# Patient Record
Sex: Female | Born: 1945
Health system: Southern US, Community
[De-identification: ages and names within clinical notes are randomized; demographics above are authoritative.]

## PROBLEM LIST (undated history)

## (undated) DIAGNOSIS — I10 Essential (primary) hypertension: Secondary | ICD-10-CM

## (undated) DIAGNOSIS — T4145XA Adverse effect of unspecified anesthetic, initial encounter: Secondary | ICD-10-CM

## (undated) DIAGNOSIS — T148XXA Other injury of unspecified body region, initial encounter: Secondary | ICD-10-CM

## (undated) DIAGNOSIS — G4733 Obstructive sleep apnea (adult) (pediatric): Secondary | ICD-10-CM

## (undated) DIAGNOSIS — L03119 Cellulitis of unspecified part of limb: Secondary | ICD-10-CM

## (undated) DIAGNOSIS — S86019A Strain of unspecified Achilles tendon, initial encounter: Secondary | ICD-10-CM

## (undated) DIAGNOSIS — J189 Pneumonia, unspecified organism: Secondary | ICD-10-CM

## (undated) DIAGNOSIS — G2581 Restless legs syndrome: Secondary | ICD-10-CM

## (undated) DIAGNOSIS — M869 Osteomyelitis, unspecified: Secondary | ICD-10-CM

## (undated) DIAGNOSIS — T8859XA Other complications of anesthesia, initial encounter: Secondary | ICD-10-CM

## (undated) DIAGNOSIS — F419 Anxiety disorder, unspecified: Secondary | ICD-10-CM

## (undated) DIAGNOSIS — M1990 Unspecified osteoarthritis, unspecified site: Secondary | ICD-10-CM

## (undated) DIAGNOSIS — L02619 Cutaneous abscess of unspecified foot: Secondary | ICD-10-CM

## (undated) DIAGNOSIS — I509 Heart failure, unspecified: Secondary | ICD-10-CM

## (undated) DIAGNOSIS — D649 Anemia, unspecified: Secondary | ICD-10-CM

## (undated) DIAGNOSIS — I499 Cardiac arrhythmia, unspecified: Secondary | ICD-10-CM

## (undated) DIAGNOSIS — E119 Type 2 diabetes mellitus without complications: Secondary | ICD-10-CM

## (undated) HISTORY — PX: ACHILLES TENDON REPAIR: SUR1153

## (undated) HISTORY — PX: ABDOMINAL HYSTERECTOMY: SHX81

## (undated) HISTORY — PX: TOE AMPUTATION: SHX809

## (undated) HISTORY — PX: BREAST SURGERY: SHX581

---

## 2011-04-25 DIAGNOSIS — R0602 Shortness of breath: Secondary | ICD-10-CM

## 2011-04-25 DIAGNOSIS — I509 Heart failure, unspecified: Secondary | ICD-10-CM

## 2011-04-26 DIAGNOSIS — I5031 Acute diastolic (congestive) heart failure: Secondary | ICD-10-CM

## 2011-04-27 ENCOUNTER — Telehealth: Payer: Self-pay | Admitting: *Deleted

## 2011-04-27 NOTE — Telephone Encounter (Addendum)
Left message for Mary [name] at [facility] hospitalist services to call our office r/e patient needing note for work. Patient informed that her note was supposed to be given by Dr. Rowe Pavy to return on 05/03/11.

## 2011-04-27 NOTE — Telephone Encounter (Signed)
Return to work on Monday, 05/03/11 . Dr. Rowe Pavy was supposed to give her a note.

## 2011-04-27 NOTE — Telephone Encounter (Signed)
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 Patient stated she was diagnosed with CHF and d/c from [facility] on yesterday.

## 2011-04-28 NOTE — Telephone Encounter (Signed)
Mrs. Mary [name] called back and said Dr. Rowe Pavy was out of town and she would see if Dr. Alean Rinne would write note for patient and she would give [place] a call back. Patient informed of the above information.

## 2011-04-28 NOTE — Telephone Encounter (Signed)
Husband of patient called to office to inquire what work note stated. Nurse informed him that the hospitalist services at [facility] provided this for patient and I was unable to read it. Husband was concerned and fustrated about MD at hospital telling her to go back to work the very next day. Nurse reassured him that Dr. Fletcher Anon stated that the plan was for patient to return to work on 05/03/11. Husband informed that d/c states that she is to f/u with [facility] in 2 weeks and appointment was confirmed with husband. Husband informed again that work note was available at [facility] main entrance with Engineer, manufacturing.

## 2011-04-28 NOTE — Telephone Encounter (Signed)
Work note available and left with volunteers at [facility] and she can pick up at main entrance of hospital per [facility].

## 2011-04-28 NOTE — Telephone Encounter (Signed)
Patient informed and said husband was on the way to pick up note now.

## 2011-04-28 NOTE — Telephone Encounter (Signed)
Left message again on Mary [name] [facility]'s voicemail again requesting a return call.

## 2011-05-20 ENCOUNTER — Encounter: Payer: Medicare Other | Admitting: Cardiovascular Disease

## 2011-07-25 ENCOUNTER — Encounter (HOSPITAL_COMMUNITY): Payer: Self-pay | Admitting: *Deleted

## 2011-07-25 ENCOUNTER — Inpatient Hospital Stay (HOSPITAL_COMMUNITY)
Admission: EM | Admit: 2011-07-25 | Discharge: 2011-07-27 | DRG: 291 | Disposition: A | Discharge status: Home / Self Care | Payer: PRIVATE HEALTH INSURANCE | Attending: Internal Medicine | Admitting: Internal Medicine

## 2011-07-25 ENCOUNTER — Emergency Department (HOSPITAL_COMMUNITY): Payer: PRIVATE HEALTH INSURANCE

## 2011-07-25 ENCOUNTER — Other Ambulatory Visit: Payer: Self-pay

## 2011-07-25 DIAGNOSIS — Z7982 Long term (current) use of aspirin: Secondary | ICD-10-CM

## 2011-07-25 DIAGNOSIS — D649 Anemia, unspecified: Secondary | ICD-10-CM | POA: Diagnosis present

## 2011-07-25 DIAGNOSIS — Z794 Long term (current) use of insulin: Secondary | ICD-10-CM

## 2011-07-25 DIAGNOSIS — E119 Type 2 diabetes mellitus without complications: Secondary | ICD-10-CM

## 2011-07-25 DIAGNOSIS — I509 Heart failure, unspecified: Secondary | ICD-10-CM | POA: Diagnosis present

## 2011-07-25 DIAGNOSIS — G2581 Restless legs syndrome: Secondary | ICD-10-CM | POA: Diagnosis present

## 2011-07-25 DIAGNOSIS — G4733 Obstructive sleep apnea (adult) (pediatric): Secondary | ICD-10-CM | POA: Diagnosis present

## 2011-07-25 DIAGNOSIS — Z79899 Other long term (current) drug therapy: Secondary | ICD-10-CM

## 2011-07-25 DIAGNOSIS — R0902 Hypoxemia: Secondary | ICD-10-CM

## 2011-07-25 DIAGNOSIS — R0600 Dyspnea, unspecified: Secondary | ICD-10-CM

## 2011-07-25 DIAGNOSIS — J9600 Acute respiratory failure, unspecified whether with hypoxia or hypercapnia: Secondary | ICD-10-CM | POA: Diagnosis present

## 2011-07-25 DIAGNOSIS — I5033 Acute on chronic diastolic (congestive) heart failure: Principal | ICD-10-CM | POA: Diagnosis present

## 2011-07-25 DIAGNOSIS — J209 Acute bronchitis, unspecified: Secondary | ICD-10-CM | POA: Diagnosis present

## 2011-07-25 DIAGNOSIS — R5381 Other malaise: Secondary | ICD-10-CM | POA: Diagnosis present

## 2011-07-25 HISTORY — DX: Restless legs syndrome: G25.81

## 2011-07-25 HISTORY — DX: Obstructive sleep apnea (adult) (pediatric): G47.33

## 2011-07-25 HISTORY — DX: Type 2 diabetes mellitus without complications: E11.9

## 2011-07-25 HISTORY — DX: Heart failure, unspecified: I50.9

## 2011-07-25 LAB — CARDIAC PANEL(CRET KIN+CKTOT+MB+TROPI)
CK, MB: 3.5 ng/mL (ref 0.3–4.0)
Relative Index: 2.2 (ref 0.0–2.5)
Relative Index: 2.3 (ref 0.0–2.5)
Troponin I: <0.3 ng/mL (ref <0.30)
Troponin I: <0.3 ng/mL (ref <0.30)

## 2011-07-25 LAB — POCT I-STAT, CHEM 8
Chloride: 105 mEq/L (ref 96–112)
Creatinine, Ser: 0.9 mg/dL (ref 0.50–1.10)
Glucose, Bld: 278 mg/dL — ABNORMAL HIGH (ref 70–99)
Potassium: 4.7 mEq/L (ref 3.5–5.1)

## 2011-07-25 LAB — CBC
HCT: 30.1 % — ABNORMAL LOW (ref 36.0–46.0)
Hemoglobin: 10.1 g/dL — ABNORMAL LOW (ref 12.0–15.0)
MCH: 29.3 pg (ref 26.0–34.0)
MCV: 87.2 fL (ref 78.0–100.0)
RBC: 3.45 MIL/uL — ABNORMAL LOW (ref 3.87–5.11)

## 2011-07-25 LAB — GLUCOSE, CAPILLARY
Glucose-Capillary: 208 mg/dL — ABNORMAL HIGH (ref 70–99)
Glucose-Capillary: 117 mg/dL — ABNORMAL HIGH (ref 70–99)

## 2011-07-25 LAB — HEMOGLOBIN A1C: Hgb A1c MFr Bld: 9.9 % — ABNORMAL HIGH (ref <5.7)

## 2011-07-25 MED ORDER — SODIUM CHLORIDE 0.9 % IV SOLN: 250.0000 mL | INTRAVENOUS | Status: DC | PRN

## 2011-07-25 MED ORDER — PREGABALIN 75 MG PO CAPS
ORAL_CAPSULE | ORAL | Status: AC
  Administered 2011-07-25: 150 mg via ORAL
  Filled 2011-07-25: qty 2

## 2011-07-25 MED ORDER — ACETAMINOPHEN 325 MG PO TABS: 650.0000 mg | ORAL_TABLET | ORAL | Status: DC | PRN

## 2011-07-25 MED ORDER — METOPROLOL SUCCINATE ER 50 MG PO TB24
50.0000 mg | ORAL_TABLET | Freq: Every day | ORAL | Status: DC
  Administered 2011-07-25 – 2011-07-27 (×3): 50 mg via ORAL
  Filled 2011-07-25 (×3): qty 1

## 2011-07-25 MED ORDER — SODIUM CHLORIDE 0.9 % IJ SOLN
3.0000 mL | INTRAMUSCULAR | Status: DC | PRN
  Administered 2011-07-25: 3 mL via INTRAVENOUS
  Filled 2011-07-25 (×2): qty 3

## 2011-07-25 MED ORDER — FUROSEMIDE 10 MG/ML IJ SOLN
40.0000 mg | Freq: Once | INTRAMUSCULAR | Status: AC
  Administered 2011-07-25: 40 mg via INTRAVENOUS
  Filled 2011-07-25: qty 4

## 2011-07-25 MED ORDER — PREGABALIN 75 MG PO CAPS
150.0000 mg | ORAL_CAPSULE | Freq: Once | ORAL | Status: AC
  Administered 2011-07-25: 150 mg via ORAL

## 2011-07-25 MED ORDER — ROPINIROLE HCL 0.25 MG PO TABS
0.5000 mg | ORAL_TABLET | Freq: Every evening | ORAL | Status: DC
  Administered 2011-07-25 – 2011-07-26 (×2): 0.5 mg via ORAL

## 2011-07-25 MED ORDER — PANTOPRAZOLE SODIUM 40 MG PO TBEC
40.0000 mg | DELAYED_RELEASE_TABLET | Freq: Every day | ORAL | Status: DC
  Administered 2011-07-25 – 2011-07-27 (×3): 40 mg via ORAL
  Filled 2011-07-25 (×4): qty 1

## 2011-07-25 MED ORDER — SODIUM CHLORIDE 0.9 % IV SOLN
Freq: Once | INTRAVENOUS | Status: AC
  Administered 2011-07-25: 1000 mL via INTRAVENOUS

## 2011-07-25 MED ORDER — INSULIN ASPART 100 UNIT/ML ~~LOC~~ SOLN
20.0000 [IU] | Freq: Three times a day (TID) | SUBCUTANEOUS | Status: DC
  Administered 2011-07-25 – 2011-07-27 (×7): 20 [IU] via SUBCUTANEOUS
  Filled 2011-07-25: qty 3

## 2011-07-25 MED ORDER — GUAIFENESIN ER 600 MG PO TB12
1200.0000 mg | ORAL_TABLET | Freq: Two times a day (BID) | ORAL | Status: DC
  Administered 2011-07-25 – 2011-07-27 (×5): 1200 mg via ORAL
  Filled 2011-07-25 (×5): qty 2

## 2011-07-25 MED ORDER — LISINOPRIL 10 MG PO TABS
20.0000 mg | ORAL_TABLET | Freq: Every day | ORAL | Status: DC
  Administered 2011-07-25 – 2011-07-27 (×3): 20 mg via ORAL
  Filled 2011-07-25 (×3): qty 2

## 2011-07-25 MED ORDER — INSULIN DETEMIR 100 UNIT/ML ~~LOC~~ SOLN
50.0000 [IU] | Freq: Two times a day (BID) | SUBCUTANEOUS | Status: DC
  Administered 2011-07-25 – 2011-07-27 (×5): 50 [IU] via SUBCUTANEOUS
  Filled 2011-07-25: qty 3

## 2011-07-25 MED ORDER — ENOXAPARIN SODIUM 40 MG/0.4ML ~~LOC~~ SOLN
40.0000 mg | SUBCUTANEOUS | Status: DC
  Administered 2011-07-25 – 2011-07-27 (×3): 40 mg via SUBCUTANEOUS
  Filled 2011-07-25 (×3): qty 0.4

## 2011-07-25 MED ORDER — INSULIN ASPART 100 UNIT/ML ~~LOC~~ SOLN
0.0000 [IU] | Freq: Every day | SUBCUTANEOUS | Status: DC
  Administered 2011-07-26: 3 [IU] via SUBCUTANEOUS

## 2011-07-25 MED ORDER — TRAMADOL HCL 50 MG PO TABS
50.0000 mg | ORAL_TABLET | Freq: Four times a day (QID) | ORAL | Status: DC | PRN
  Administered 2011-07-25 – 2011-07-27 (×2): 50 mg via ORAL
  Filled 2011-07-25 (×2): qty 1

## 2011-07-25 MED ORDER — INSULIN ASPART 100 UNIT/ML ~~LOC~~ SOLN
0.0000 [IU] | Freq: Three times a day (TID) | SUBCUTANEOUS | Status: DC
  Administered 2011-07-25: 8 [IU] via SUBCUTANEOUS
  Administered 2011-07-25: 5 [IU] via SUBCUTANEOUS
  Administered 2011-07-26: 2 [IU] via SUBCUTANEOUS
  Administered 2011-07-26: 3 [IU] via SUBCUTANEOUS
  Administered 2011-07-27 (×2): 2 [IU] via SUBCUTANEOUS

## 2011-07-25 MED ORDER — CYCLOBENZAPRINE HCL 10 MG PO TABS
5.0000 mg | ORAL_TABLET | Freq: Three times a day (TID) | ORAL | Status: DC
  Administered 2011-07-25 – 2011-07-27 (×7): 5 mg via ORAL
  Filled 2011-07-25 (×7): qty 1

## 2011-07-25 MED ORDER — FUROSEMIDE 10 MG/ML IJ SOLN
INTRAMUSCULAR | Status: AC
  Filled 2011-07-25: qty 4

## 2011-07-25 MED ORDER — ROPINIROLE HCL 1 MG PO TABS
ORAL_TABLET | ORAL | Status: AC
  Filled 2011-07-25: qty 1

## 2011-07-25 MED ORDER — ASPIRIN EC 81 MG PO TBEC
81.0000 mg | DELAYED_RELEASE_TABLET | Freq: Every day | ORAL | Status: DC
  Administered 2011-07-25 – 2011-07-27 (×3): 81 mg via ORAL
  Filled 2011-07-25 (×3): qty 1

## 2011-07-25 MED ORDER — IOHEXOL 350 MG/ML SOLN
100.0000 mL | Freq: Once | INTRAVENOUS | Status: AC | PRN
  Administered 2011-07-25: 100 mL via INTRAVENOUS

## 2011-07-25 MED ORDER — SODIUM CHLORIDE 0.9 % IJ SOLN
3.0000 mL | Freq: Two times a day (BID) | INTRAMUSCULAR | Status: DC
  Administered 2011-07-25 – 2011-07-27 (×5): 3 mL via INTRAVENOUS
  Filled 2011-07-25 (×4): qty 3

## 2011-07-25 MED ORDER — FUROSEMIDE 10 MG/ML IJ SOLN: 40.0000 mg | Freq: Once | INTRAMUSCULAR | Status: DC

## 2011-07-25 MED ORDER — ALBUTEROL SULFATE HFA 108 (90 BASE) MCG/ACT IN AERS
2.0000 | INHALATION_SPRAY | Freq: Four times a day (QID) | RESPIRATORY_TRACT | Status: DC
  Administered 2011-07-25 – 2011-07-27 (×6): 2 via RESPIRATORY_TRACT
  Filled 2011-07-25: qty 6.7

## 2011-07-25 MED ORDER — FUROSEMIDE 10 MG/ML IJ SOLN
40.0000 mg | Freq: Two times a day (BID) | INTRAMUSCULAR | Status: DC
  Administered 2011-07-25 – 2011-07-26 (×3): 40 mg via INTRAVENOUS
  Filled 2011-07-25 (×3): qty 4

## 2011-07-25 MED ORDER — ONDANSETRON HCL 4 MG/2ML IJ SOLN: 4.0000 mg | Freq: Four times a day (QID) | INTRAMUSCULAR | Status: DC | PRN

## 2011-07-25 NOTE — ED Notes (Signed)
Pt report chf. Started having trouble breathing 2 days ago. Became worse tonight. Pt states has o2 at home & cpap at night. Not using cpap all night.

## 2011-07-25 NOTE — ED Notes (Signed)
Attempted to call report, nurse will return call.

## 2011-07-25 NOTE — ED Provider Notes (Signed)
History     CSN: [id number]  Arrival date & time 07/25/11  [id number]   First MD Initiated Contact with Patient 07/25/11 [id number]      Chief Complaint  Patient presents with  . Shortness of Breath    (Consider location/radiation/quality/duration/timing/severity/associated sxs/prior treatment) Patient is a 66 y.o. female presenting with shortness of breath. The history is provided by the patient.  Shortness of Breath  The current episode started today. The problem has been gradually worsening. The problem is moderate. The symptoms are relieved by nothing. The symptoms are aggravated by a supine position. Associated symptoms include shortness of breath. Pertinent negatives include no chest pain, no chest pressure, no fever, no cough and no wheezing. There were no sick contacts. Recently, medical care has been given by the PCP. Services Performed: social she has congestive heart failure, recently taken off of Lasix. Patient unable to tell why.  symptoms more severe tonight, unable to lay down flat and worse with any exertion. No cough or recent illness. She has some right lower extremity swelling with recent callus" infection of right foot". She denies any calf pain or streaking erythema. No trauma.  Past Medical History  Diagnosis Date  . CHF (congestive heart failure)   . Diabetes mellitus     Past Surgical History  Procedure Date  . Abdominal hysterectomy   . Achilles tendon repair     No family history on file.  History  Substance Use Topics  . Smoking status: Never Smoker   . Smokeless tobacco: Not on file  . Alcohol Use: No    OB History    Grav Para Term Preterm Abortions TAB SAB Ect Mult Living                  Review of Systems  Constitutional: Negative for fever and chills.  HENT: Negative for neck pain and neck stiffness.   Eyes: Negative for pain.  Respiratory: Positive for shortness of breath. Negative for cough and wheezing.   Cardiovascular: Negative for chest  pain and palpitations.  Gastrointestinal: Negative for abdominal pain.  Genitourinary: Negative for dysuria.  Musculoskeletal: Negative for back pain.  Skin: Negative for rash.  Neurological: Negative for headaches.  All other systems reviewed and are negative.    Allergies  Review of patient's allergies indicates no known allergies.  Home Medications   Current Outpatient Rx  Name Route Sig Dispense Refill  . CYCLOBENZAPRINE HCL 5 MG PO TABS Oral Take 5 mg by mouth 3 (three) times daily.    Marland Kitchen LEVEMIR FLEXPEN [place] Subcutaneous Inject 100 Units into the skin. 50units in morning & 50uints at night.    . INSULIN LISPRO (HUMAN) 100 UNIT/ML [place] SOLN Subcutaneous Inject 100 Units into the skin 3 (three) times daily before meals.    Marland Kitchen LISINOPRIL 20 MG PO TABS Oral Take 20 mg by mouth daily.    Marland Kitchen METFORMIN HCL 500 MG PO TABS Oral Take 500 mg by mouth 2 (two) times daily with a meal.    . METOPROLOL SUCCINATE ER 50 MG PO TB24 Oral Take 50 mg by mouth daily. Take with or immediately following a meal.    . OMEPRAZOLE 20 MG PO CPDR Oral Take 20 mg by mouth daily.    Marland Kitchen ROPINIROLE HCL 0.5 MG PO TABS Oral Take 0.5 mg by mouth Nightly.    . SULFAMETHOXAZOLE-TMP DS 800-160 MG PO TABS Oral Take 1 tablet by mouth 2 (two) times daily.    . TRAMADOL HCL  50 MG PO TABS Oral Take 50 mg by mouth 4 (four) times daily as needed.      BP 102/84  Pulse 80  Temp(Src) 98.2 F (36.8 C) (Oral)  Resp 21  Ht 5\' 10"  (1.778 m)  Wt 298 lb (135.172 kg)  BMI 42.76 kg/m2  SpO2 96%  LMP 07/25/2011  Physical Exam  Constitutional: She is oriented to person, place, and time. She appears well-developed and well-nourished.  HENT:  Head: Normocephalic and atraumatic.  Eyes: Conjunctivae and EOM are normal. Pupils are equal, round, and reactive to light.  Neck: Trachea normal. Neck supple. No thyromegaly present.  Cardiovascular: Normal rate, regular rhythm, S1 normal, S2 normal and normal pulses.     No systolic murmur  is present   No diastolic murmur is present  Pulses:      Radial pulses are 2+ on the right side, and 2+ on the left side.  Pulmonary/Chest: She has no wheezes. She has no rhonchi. She exhibits no tenderness.       Proxy a. Tachypnea. Mild respiratory distress. Basilar posterior rales  Abdominal: Soft. Normal appearance and bowel sounds are normal. There is no tenderness. There is no CVA tenderness and negative Murphy's sign.  Musculoskeletal:       Right foot with callus over the medial aspect of the heel and mild surrounding erythema and edema. No calf tenderness or cords. Negative Homans sign  Neurological: She is alert and oriented to person, place, and time. She has normal strength. No cranial nerve deficit or sensory deficit. GCS eye subscore is 4. GCS verbal subscore is 5. GCS motor subscore is 6.  Skin: Skin is warm and dry. No rash noted. She is not diaphoretic.  Psychiatric: Her speech is normal.       Cooperative and appropriate    ED Course  Procedures (including critical care time)  Results for orders placed during the hospital encounter of 07/25/11  CBC      Component Value Range   WBC 8.9  4.0 - 10.5 (K/uL)   RBC 3.45 (*) 3.87 - 5.11 (MIL/uL)   Hemoglobin 10.1 (*) 12.0 - 15.0 (g/dL)   HCT 30.1 (*) 36.0 - 46.0 (%)   MCV 87.2  78.0 - 100.0 (fL)   MCH 29.3  26.0 - 34.0 (pg)   MCHC 33.6  30.0 - 36.0 (g/dL)   RDW 15.3  11.5 - 15.5 (%)   Platelets 271  150 - 400 (K/uL)  PRO B NATRIURETIC PEPTIDE      Component Value Range   Pro B Natriuretic peptide (BNP) 586.6 (*) 0 - 125 (pg/mL)  POCT I-STAT, CHEM 8      Component Value Range   Sodium 137  135 - 145 (mEq/L)   Potassium 4.7  3.5 - 5.1 (mEq/L)   Chloride 105  96 - 112 (mEq/L)   BUN 20  6 - 23 (mg/dL)   Creatinine, Ser 0.90  0.50 - 1.10 (mg/dL)   Glucose, Bld 278 (*) 70 - 99 (mg/dL)   Calcium, Ion 1.16  1.12 - 1.32 (mmol/L)   TCO2 24  0 - 100 (mmol/L)   Hemoglobin 10.2 (*) 12.0 - 15.0 (g/dL)   HCT 30.0 (*) 36.0 -  46.0 (%)  POCT I-STAT TROPONIN I      Component Value Range   Troponin i, poc 0.00  0.00 - 0.08 (ng/mL)   Comment 3            Dg Chest Portable 1 View  07/25/2011  *RADIOLOGY REPORT*  Clinical Data: Shortness of breath.  PORTABLE CHEST - 1 VIEW  Comparison: None.  Findings: Heart is mildly enlarged.  Vascular congestion.  Diffuse peribronchial thickening.  No confluent opacities or effusions.  IMPRESSION: Cardiomegaly with vascular congestion.  Diffuse peribronchial thickening, likely bronchitis.  Original Report Authenticated By: Raelyn Number, M.D.     Dg Chest Portable 1 View  07/25/2011  *RADIOLOGY REPORT*  Clinical Data: Shortness of breath.  PORTABLE CHEST - 1 VIEW  Comparison: None.  Findings: Heart is mildly enlarged.  Vascular congestion.  Diffuse peribronchial thickening.  No confluent opacities or effusions.  IMPRESSION: Cardiomegaly with vascular congestion.  Diffuse peribronchial thickening, likely bronchitis.  Original Report Authenticated By: Raelyn Number, M.D.     Date: 07/25/2011  Rate: 78  Rhythm: normal sinus rhythm  QRS Axis: normal  Intervals: normal  ST/T Wave abnormalities: nonspecific ST changes  Conduction Disutrbances:none  Narrative Interpretation:   Old EKG Reviewed: none available  Hypoxia improved with oxygen. Lasix IV 40 mg provided. Labs and x-ray reviewed as above.   MDM  shortness of breath and hypoxia with recent diagnosis of congestive heart failure. Presentation concerning for the same.Followed by primary care and has not seen a cardiologist.she does have elevated d-dimer sent for CT scan. AT 710am, Case discussed with triad hospitalist who agrees to admission        Teressa Lower, MD 07/25/11 [phone]

## 2011-07-25 NOTE — H&P (Signed)
PCP:   No primary provider on file.   Chief Complaint:  Shortness of breath  HPI: This is a 66 year old female who presents to the emergency room with complaints of shortness of breath. She reports being recently diagnosed with congestive heart failure and was placed on Lasix. Apparently approximately 3 months ago she was taken off her Lasix due to dehydration. Since being off the Lasix she has a gradual decline in her respiratory status. He was initially noted when she was having difficulty with dyspnea on exertion. This has progressively gotten worse. Last night she reports that she was unable to sleep due to shortness of breath. She noted to be wheezing. She's had a mild nonproductive cough. She denies any fever. No chest pain. She denies any sick contacts. She has not noted any significant edema in her lower extremities. She sleeps with 2 pillows. She was recently started on CPAP for obstructive sleep apnea. She reports increasingly fatigued, tired. She is frustrated from being sick for the past 2 months. She reports seeing multiple doctors and has been told different things such as she has congestive heart failure and that she has pneumonia.  Patient was evaluated in the emergency room where she was noted to be hypoxic with oxygen saturations in the high 80s on room air, chest x-ray showed no signs of fluid overload as well as possible bronchitis. She did receive some Lasix in the emergency room and has had urine output. She reports an improvement in her symptoms.  Allergies:  No Known Allergies    Past Medical History  Diagnosis Date  . CHF (congestive heart failure)   . Diabetes mellitus   . Restless leg syndrome 07/25/2011  . OSA (obstructive sleep apnea) 07/25/2011    Past Surgical History  Procedure Date  . Abdominal hysterectomy   . Achilles tendon repair     Prior to Admission medications   Medication Sig Start Date End Date Taking? Authorizing Provider  cyclobenzaprine  (FLEXERIL) 5 MG tablet Take 5 mg by mouth 3 (three) times daily.   Yes Historical Provider, MD  Insulin Detemir (LEVEMIR FLEXPEN [place]) Inject 100 Units into the skin. 50units in morning & 50uints at night.   Yes Historical Provider, MD  insulin lispro (HUMALOG) 100 UNIT/ML injection Inject 100 Units into the skin 3 (three) times daily before meals.   Yes Historical Provider, MD  lisinopril (PRINIVIL,ZESTRIL) 20 MG tablet Take 20 mg by mouth daily.   Yes Historical Provider, MD  metFORMIN (GLUCOPHAGE) 500 MG tablet Take 500 mg by mouth 2 (two) times daily with a meal.   Yes Historical Provider, MD  metoprolol succinate (TOPROL-XL) 50 MG 24 hr tablet Take 50 mg by mouth daily. Take with or immediately following a meal.   Yes Historical Provider, MD  omeprazole (PRILOSEC) 20 MG capsule Take 20 mg by mouth daily.   Yes Historical Provider, MD  rOPINIRole (REQUIP) 0.5 MG tablet Take 0.5 mg by mouth Nightly.   Yes Historical Provider, MD  sulfamethoxazole-trimethoprim (BACTRIM DS) 800-160 MG per tablet Take 1 tablet by mouth 2 (two) times daily.   Yes Historical Provider, MD  traMADol (ULTRAM) 50 MG tablet Take 50 mg by mouth 4 (four) times daily as needed.   Yes Historical Provider, MD    Social History:  reports that she has never smoked. She does not have any smokeless tobacco history on file. She reports that she does not drink alcohol or use illicit drugs.  No family history on file.  Review of Systems: Positives in bold Constitutional: Denies fever, chills, diaphoresis, appetite change and fatigue.  HEENT: Denies photophobia, eye pain, redness, hearing loss, ear pain, congestion, sore throat, rhinorrhea, sneezing, mouth sores, trouble swallowing, neck pain, neck stiffness and tinnitus.   Respiratory: Denies SOB, DOE, cough, chest tightness,  and wheezing.   Cardiovascular: Denies chest pain, palpitations and leg swelling.  Gastrointestinal: Denies nausea, vomiting, abdominal pain, diarrhea,  constipation, blood in stool and abdominal distention.  Genitourinary: Denies dysuria, urgency, frequency, hematuria, flank pain and difficulty urinating.  Musculoskeletal: Denies myalgias, back pain, joint swelling, arthralgias and gait problem.  Skin: Denies pallor, rash and wound.  Neurological: Denies dizziness, seizures, syncope, weakness, light-headedness, numbness and headaches.  Hematological: Denies adenopathy. Easy bruising, personal or family bleeding history  Psychiatric/Behavioral: Denies suicidal ideation, mood changes, confusion, nervousness, sleep disturbance and agitation   Physical Exam: Blood pressure 102/84, pulse 80, temperature 98.2 F (36.8 C), temperature source Oral, resp. rate 21, height 5\' 10"  (1.778 m), weight 135.172 kg (298 lb), last menstrual period 07/25/2011, SpO2 96.00%. General: Patient is lying in bed, in no acute distress, alert and 90x3 HEENT: Normocephalic, H., pupils are equal round reactive to light Chest: Crackles at the bases bilaterally Abdomen: Soft, nontender, and nondistended, bowel sounds are active Cardiac: S1, S2, regular rate and rhythm Extremities: Trace edema bilaterally, no cyanosis, no clubbing Neurologic: Grossly intact, nonfocal Skin: Intact, warm, no visible rashes  Labs on Admission:  Results for orders placed during the hospital encounter of 07/25/11 (from the past 48 hour(s))  CBC     Status: Abnormal   Collection Time   07/25/11  5:21 AM      Component Value Range Comment   WBC 8.9  4.0 - 10.5 (K/uL)    RBC 3.45 (*) 3.87 - 5.11 (MIL/uL)    Hemoglobin 10.1 (*) 12.0 - 15.0 (g/dL)    HCT 30.1 (*) 36.0 - 46.0 (%)    MCV 87.2  78.0 - 100.0 (fL)    MCH 29.3  26.0 - 34.0 (pg)    MCHC 33.6  30.0 - 36.0 (g/dL)    RDW 15.3  11.5 - 15.5 (%)    Platelets 271  150 - 400 (K/uL)   PRO B NATRIURETIC PEPTIDE     Status: Abnormal   Collection Time   07/25/11  5:21 AM      Component Value Range Comment   Pro B Natriuretic peptide (BNP)  586.6 (*) 0 - 125 (pg/mL)   D-DIMER, QUANTITATIVE     Status: Abnormal   Collection Time   07/25/11  5:42 AM      Component Value Range Comment   D-Dimer, Quant 1.43 (*) 0.00 - 0.48 (ug/mL-FEU)   POCT I-STAT TROPONIN I     Status: Normal   Collection Time   07/25/11  5:47 AM      Component Value Range Comment   Troponin i, poc 0.00  0.00 - 0.08 (ng/mL)    Comment 3            POCT I-STAT, CHEM 8     Status: Abnormal   Collection Time   07/25/11  5:49 AM      Component Value Range Comment   Sodium 137  135 - 145 (mEq/L)    Potassium 4.7  3.5 - 5.1 (mEq/L)    Chloride 105  96 - 112 (mEq/L)    BUN 20  6 - 23 (mg/dL)    Creatinine, Ser 0.90  0.50 - 1.10 (  mg/dL)    Glucose, Bld 278 (*) 70 - 99 (mg/dL)    Calcium, Ion 1.16  1.12 - 1.32 (mmol/L)    TCO2 24  0 - 100 (mmol/L)    Hemoglobin 10.2 (*) 12.0 - 15.0 (g/dL)    HCT 30.0 (*) 36.0 - 46.0 (%)     Radiological Exams on Admission: Dg Chest Portable 1 View  07/25/2011  *RADIOLOGY REPORT*  Clinical Data: Shortness of breath.  PORTABLE CHEST - 1 VIEW  Comparison: None.  Findings: Heart is mildly enlarged.  Vascular congestion.  Diffuse peribronchial thickening.  No confluent opacities or effusions.  IMPRESSION: Cardiomegaly with vascular congestion.  Diffuse peribronchial thickening, likely bronchitis.  Original Report Authenticated By: Raelyn Number, M.D.    Assessment/Plan #1. Acute exacerbation of congestive heart failure. Patient reports improvement with Lasix. She'll be admitted to a telemetry bed we'll we will cycle her cardiac markers. She has been off her Lasix for approximately 3 weeks now which is likely the cause of her exacerbation. We will start her on IV Lasix for now monitor her urine output. We will repeat an x-ray in the morning. We will also get a 2-D echocardiogram. We'll request an inpatient cardiology consultation if the patient does not have an adequate improvement. Otherwise she can likely follow up with them as an  outpatient. Patient has been ordered a CT of the chest by the EDP, this is currently pending.  #2. Acute bronchitis. Patient will be treated supportively. We will give her albuterol as well as mucolytics.  #3. Diabetes. Patient will be continued on her outpatient dose of Levemir. We will continue her NovoLog with meals. Will continue her on sliding scale insulin and check hemoglobin A1c.  #4. Generalized weakness/fatigue. This is likely multifactorial. It may be related to her uncontrolled obstructive sleep apnea. We'll also check a TSH. It is likely also due to the patient's weight and generalized deconditioning.  #5. Anemia. This is mild and can be further worked up as an outpatient.  #6. Obstructive sleep apnea. She'll be continued on CPAP her respiratory therapy  Further orders per the clinical course  Time Spent on Admission: 79mins  Ilianna Bown [facility] Pager: [phone] 07/25/2011, 8:52 AM

## 2011-07-26 ENCOUNTER — Inpatient Hospital Stay (HOSPITAL_COMMUNITY): Payer: PRIVATE HEALTH INSURANCE

## 2011-07-26 DIAGNOSIS — I517 Cardiomegaly: Secondary | ICD-10-CM

## 2011-07-26 LAB — BASIC METABOLIC PANEL
CO2: 29 mEq/L (ref 19–32)
Chloride: 100 mEq/L (ref 96–112)
Sodium: 137 mEq/L (ref 135–145)

## 2011-07-26 LAB — GLUCOSE, CAPILLARY
Glucose-Capillary: 190 mg/dL — ABNORMAL HIGH (ref 70–99)
Glucose-Capillary: 117 mg/dL — ABNORMAL HIGH (ref 70–99)
Glucose-Capillary: 259 mg/dL — ABNORMAL HIGH (ref 70–99)
Glucose-Capillary: 129 mg/dL — ABNORMAL HIGH (ref 70–99)

## 2011-07-26 LAB — CARDIAC PANEL(CRET KIN+CKTOT+MB+TROPI): Relative Index: 2.2 (ref 0.0–2.5)

## 2011-07-26 MED ORDER — POTASSIUM CHLORIDE ER 10 MEQ PO TBCR: 20.0000 meq | EXTENDED_RELEASE_TABLET | Freq: Every day | ORAL | Status: DC

## 2011-07-26 MED ORDER — FUROSEMIDE 40 MG PO TABS
40.0000 mg | ORAL_TABLET | Freq: Every day | ORAL | Status: DC
  Administered 2011-07-27: 40 mg via ORAL
  Filled 2011-07-26: qty 1

## 2011-07-26 MED ORDER — ASPIRIN 81 MG PO TBEC: 81.0000 mg | DELAYED_RELEASE_TABLET | Freq: Every day | ORAL | Status: AC

## 2011-07-26 MED ORDER — ROPINIROLE HCL 1 MG PO TABS
ORAL_TABLET | ORAL | Status: AC
  Filled 2011-07-26: qty 1

## 2011-07-26 MED ORDER — FUROSEMIDE 40 MG PO TABS: 40.0000 mg | ORAL_TABLET | Freq: Every day | ORAL | Status: DC

## 2011-07-26 MED ORDER — PREGABALIN 75 MG PO CAPS
150.0000 mg | ORAL_CAPSULE | Freq: Every day | ORAL | Status: DC
  Administered 2011-07-26 (×2): 150 mg via ORAL
  Filled 2011-07-26 (×2): qty 2

## 2011-07-26 NOTE — Progress Notes (Signed)
CARE MANAGEMENT NOTE 07/26/2011  Patient:  Mary [name], Mary [name]   Account Number:  [id number]  Date Initiated:  07/26/2011  Documentation initiated by:  Claretha Cooper  Subjective/Objective Assessment:   Pt admitted with CHF. States her MD in [place] took her off her Lasix 3 wks ago and that is why she was admitted. Not happy with Physician. PTA lived at home with spouse.     Action/Plan:   Plan to DC home. [place] RN arranged iwth [facility] but pt declined at DC since she was going to [place] next week.   Anticipated DC Date:  07/26/2011   Anticipated DC Plan:  [place]  CM consult      Choice offered to / List presented to:             Status of service:  Completed, signed off Medicare Important Message given?   (If response is "NO", the following Medicare IM given date fields will be blank) Date Medicare IM given:   Date Additional Medicare IM given:    Discharge Disposition:  HOME/SELF CARE  Per UR Regulation:    Comments:  07/26/11 [place] BSN

## 2011-07-26 NOTE — Progress Notes (Signed)
Subjective: Feeling better today, breathing has improved.  Objective: Vital signs in last 24 hours: Temp:  [98.5 F (36.9 C)-98.8 F (37.1 C)] 98.8 F (37.1 C) (02/25 1443) Pulse Rate:  [68-79] 79  (02/25 1443) Resp:  [18-20] 20  (02/25 1443) BP: (92-125)/(53-71) 125/71 mmHg (02/25 1443) SpO2:  [92 %-97 %] 94 % (02/25 1937) Weight:  [137.6 kg (303 lb 5.7 oz)] 137.6 kg (303 lb 5.7 oz) (02/25 0423) Weight change: 2.628 kg (5 lb 12.7 oz) Last BM Date: 07/25/11  Intake/Output from previous day: 02/24 0701 - 02/25 0700 In: 483 [P.O.:480; I.V.:3] Out: 3100 [Urine:3100]     Physical Exam: General: Alert, awake, oriented x3, in no acute distress. HEENT: No bruits, no goiter. Heart: Regular rate and rhythm, without murmurs, rubs, gallops. Lungs: mild crackles at bases. Abdomen: Soft, nontender, nondistended, positive bowel sounds. Extremities: No clubbing cyanosis or edema with positive pedal pulses. Neuro: Grossly intact, nonfocal.    Lab Results: Basic Metabolic Panel:  Basename 07/26/11 0458 07/25/11 0549  NA 137 137  K 4.2 4.7  CL 100 105  CO2 29 --  GLUCOSE 240* 278*  BUN 27* 20  CREATININE 1.16* 0.90  CALCIUM 8.8 --  MG -- --  PHOS -- --   Liver Function Tests: No results found for this basename: AST:2,ALT:2,ALKPHOS:2,BILITOT:2,PROT:2,ALBUMIN:2 in the last 72 hours No results found for this basename: LIPASE:2,AMYLASE:2 in the last 72 hours No results found for this basename: AMMONIA:2 in the last 72 hours CBC:  Basename 07/25/11 0549 07/25/11 0521  WBC -- 8.9  NEUTROABS -- --  HGB 10.2* 10.1*  HCT 30.0* 30.1*  MCV -- 87.2  PLT -- 271   Cardiac Enzymes:  Basename 07/26/11 0458 07/25/11 1751 07/25/11 1113  CKTOTAL 154 150 199*  CKMB 3.4 3.5 4.4*  CKMBINDEX -- -- --  TROPONINI <0.30 <0.30 <0.30   BNP:  Basename 07/25/11 0521  PROBNP 586.6*   D-Dimer:  Basename 07/25/11 0542  DDIMER 1.43*   CBG:  Basename 07/26/11 1728 07/26/11 1421  07/26/11 1150 07/26/11 0748 07/25/11 2128 07/25/11 1656  GLUCAP 129* 259* 117* 190* 117* 208*   Hemoglobin A1C:  Basename 07/25/11 0521  HGBA1C 9.9*   Fasting Lipid Panel: No results found for this basename: CHOL,HDL,LDLCALC,TRIG,CHOLHDL,LDLDIRECT in the last 72 hours Thyroid Function Tests:  Basename 07/25/11 0521  TSH 5.290*  T4TOTAL --  FREET4 --  T3FREE --  THYROIDAB --   Anemia Panel: No results found for this basename: VITAMINB12,FOLATE,FERRITIN,TIBC,IRON,RETICCTPCT in the last 72 hours Coagulation: No results found for this basename: LABPROT:2,INR:2 in the last 72 hours Urine Drug Screen: Drugs of Abuse  No results found for this basename: labopia, cocainscrnur, labbenz, amphetmu, thcu, labbarb    Alcohol Level: No results found for this basename: ETH:2 in the last 72 hours Urinalysis: No results found for this basename: COLORURINE:2,APPERANCEUR:2,LABSPEC:2,PHURINE:2,GLUCOSEU:2,HGBUR:2,BILIRUBINUR:2,KETONESUR:2,PROTEINUR:2,UROBILINOGEN:2,NITRITE:2,LEUKOCYTESUR:2 in the last 72 hours  No results found for this or any previous visit (from the past 240 hour(s)).  Studies/Results: Dg Chest 2 View  07/26/2011  *RADIOLOGY REPORT*  Clinical Data: CHF, shortness of breath  CHEST - 2 VIEW  Comparison: Portable chest of 07/25/2011 and CT chest of the same date  Findings: Aeration has improved.  Cardiomegaly is stable.  There is still mild peribronchial thickening noted and small effusions are present most consistent with very mild edema. No bony abnormality is seen.  IMPRESSION: Improved aeration.  Improvement in mild edema pattern.  Small effusions remain.  Original Report Authenticated By: Joretta Bachelor, M.D.  Ct Angio Chest W/cm &/or Wo Cm  07/25/2011  *RADIOLOGY REPORT*  Clinical Data: 66 year old female with shortness of breath and elevated D-dimer.  CT ANGIOGRAPHY CHEST  Technique:  Multidetector CT imaging of the chest using the standard protocol during bolus  administration of intravenous contrast. Multiplanar reconstructed images including MIPs were obtained and reviewed to evaluate the vascular anatomy.  Contrast: 147mL OMNIPAQUE IOHEXOL 350 MG/ML IV SOLN  Comparison: 04/25/2011  Findings: This is a technically adequate study but respiratory motion artifact slightly limit sensitivity, especially in the lower lungs.  No pulmonary emboli are identified. There is no evidence of thoracic aortic aneurysm.  Mild cardiomegaly is present.  Very small bilateral pleural effusions are identified. There is no evidence of pericardial effusion. No enlarged or abnormal-appearing lymph nodes are present. Shotty mediastinal and hilar lymph nodes are relatively unchanged.  Ground-glass opacities bilaterally are primarily central and likely represent interstitial pulmonary edema. Bibasilar atelectasis is identified. There is no evidence of suspicious nodule or mass.  No endobronchial or endotracheal lesions are identified.  No acute or suspicious bony abnormalities are noted.  IMPRESSION: Bilateral central ground-glass opacities likely representing interstitial pulmonary edema.  With cardiomegaly and small bilateral pleural effusions these findings are compatible with congestive heart failure.  No evidence of pulmonary emboli or thoracic aortic aneurysm.  Bibasilar atelectasis.  Original Report Authenticated By: Lura Em, M.D.   Dg Chest Portable 1 View  07/25/2011  *RADIOLOGY REPORT*  Clinical Data: Shortness of breath.  PORTABLE CHEST - 1 VIEW  Comparison: None.  Findings: Heart is mildly enlarged.  Vascular congestion.  Diffuse peribronchial thickening.  No confluent opacities or effusions.  IMPRESSION: Cardiomegaly with vascular congestion.  Diffuse peribronchial thickening, likely bronchitis.  Original Report Authenticated By: Raelyn Number, M.D.    Medications: Scheduled Meds:   . albuterol  2 puff Inhalation Q6H  . aspirin EC  81 mg Oral Daily  . cyclobenzaprine   5 mg Oral TID  . enoxaparin  40 mg Subcutaneous Q24H  . furosemide  40 mg Intravenous BID  . guaiFENesin  1,200 mg Oral BID  . insulin aspart  0-15 Units Subcutaneous TID WC  . insulin aspart  0-5 Units Subcutaneous QHS  . insulin aspart  20 Units Subcutaneous TID WC  . insulin detemir  50 Units Subcutaneous BID  . lisinopril  20 mg Oral Daily  . metoprolol succinate  50 mg Oral Daily  . pantoprazole  40 mg Oral Q1200  . pregabalin  150 mg Oral Daily  . rOPINIRole  0.5 mg Oral Nightly  . sodium chloride  3 mL Intravenous Q12H   Continuous Infusions:  PRN Meds:.sodium chloride, acetaminophen, ondansetron (ZOFRAN) IV, sodium chloride, traMADol  Assessment/Plan:  Principal Problem:  *Acute exacerbation of CHF (congestive heart failure) Active Problems:  Diabetes mellitus  OSA (obstructive sleep apnea)  Acute bronchitis  Generalized weakness  Acute respiratory failure  Restless leg syndrome  Anemia  Plan:  Patient is improving after diuresis with lasix.  Echo has been done with report pending.  She will need to be continued on a maintenance dose of lasix. Will change to 40mg  po daily. Would like to have echo results prior to discharge to make sure EF is intact Plans will be for outpatient follow up with cardiology for further management She was offered home health services for further management and education for her heart failure, but she has declined this.  Will likely plan for discharge tomorrow when echo results are available.  LOS: 1 day   [facility] Pager: [phone] 07/26/2011, 7:51 PM

## 2011-07-26 NOTE — Progress Notes (Signed)
*  PRELIMINARY RESULTS* Echocardiogram 2D Echocardiogram has been performed.  [name] 07/26/2011, 12:01 PM

## 2011-07-27 LAB — BASIC METABOLIC PANEL
CO2: 30 mEq/L (ref 19–32)
Calcium: 8.8 mg/dL (ref 8.4–10.5)
Creatinine, Ser: 1.11 mg/dL — ABNORMAL HIGH (ref 0.50–1.10)
GFR calc non Af Amer: 51 mL/min — ABNORMAL LOW (ref >90)

## 2011-07-27 LAB — GLUCOSE, CAPILLARY

## 2011-07-27 NOTE — Plan of Care (Signed)
Problem: Phase I Progression Outcomes Goal: OOB as tolerated unless otherwise ordered Outcome: Progressing Pt stands at bedside and ambulates in room at times

## 2011-07-27 NOTE — Discharge Summary (Signed)
Physician Discharge Summary  Patient ID: Mary [name] MRN: [id number] DOB/AGE: 1945/10/08 66 y.o.  Admit date: 07/25/2011 Discharge date: 07/27/2011  Primary Care Physician:  Moshe Cipro, MD, MD   Discharge Diagnoses:    Principal Problem:  *Acute on chronic diastolic CHF (congestive heart failure) Active Problems:  Diabetes mellitus  OSA (obstructive sleep apnea)  Acute bronchitis  Generalized weakness  Acute respiratory failure  Restless leg syndrome  Anemia    Medication List  As of 07/27/2011 12:26 PM   STOP taking these medications         sulfamethoxazole-trimethoprim 800-160 MG per tablet         TAKE these medications         aspirin 81 MG EC tablet   Take 1 tablet (81 mg total) by mouth daily.      cholecalciferol 1000 UNITS tablet   Commonly known as: VITAMIN D   Take 1,000 Units by mouth 2 (two) times daily.      cyclobenzaprine 5 MG tablet   Commonly known as: FLEXERIL   Take 5-10 mg by mouth 3 (three) times daily as needed. FOR MUSCLE PAIN      furosemide 40 MG tablet   Commonly known as: LASIX   Take 1 tablet (40 mg total) by mouth daily.      insulin detemir 100 UNIT/ML injection   Commonly known as: LEVEMIR   Inject 50 Units into the skin 2 (two) times daily.      insulin lispro 100 UNIT/ML injection   Commonly known as: HUMALOG   Inject 100 Units into the skin 3 (three) times daily before meals.      lisinopril 20 MG tablet   Commonly known as: PRINIVIL,ZESTRIL   Take 20 mg by mouth daily.      metFORMIN 500 MG tablet   Commonly known as: GLUCOPHAGE   Take 500 mg by mouth 2 (two) times daily with a meal. DO NOT START UNTIL 07-28-11 DUE TO PROCEDURE   Start taking on: 07/28/2011      metoprolol succinate 50 MG 24 hr tablet   Commonly known as: TOPROL-XL   Take 50 mg by mouth daily. Take with or immediately following a meal.      omeprazole 20 MG capsule   Commonly known as: PRILOSEC   Take 20 mg by mouth daily.      potassium  chloride 10 MEQ tablet   Commonly known as: K-DUR   Take 2 tablets (20 mEq total) by mouth daily.      pregabalin 75 MG capsule   Commonly known as: LYRICA   Take 150 mg by mouth at bedtime.      rOPINIRole 0.5 MG tablet   Commonly known as: REQUIP   Take 0.5 mg by mouth at bedtime.      simvastatin 40 MG tablet   Commonly known as: ZOCOR   Take 40 mg by mouth every evening.      traMADol 50 MG tablet   Commonly known as: ULTRAM   Take 50 mg by mouth 4 (four) times daily as needed. FOR PAIN           Discharge Exam: No complaints, doing well Blood pressure 98/63, pulse 85, temperature 98.3 F (36.8 C), temperature source Oral, resp. rate 18, height 5\' 10"  (1.778 m), weight 137.6 kg (303 lb 5.7 oz), last menstrual period 07/25/2011, SpO2 98.00%. NAD CTA B S1, S2,RRR Soft, NT, BS+ No edema in LE b/l  Disposition and Follow-up:  Follow up with primary care doctor in 1 week Patient will be scheduled to be seen in the cardiology clinic  Consults:  none   Significant Diagnostic Studies:  Ct Angio Chest W/cm &/or Wo Cm  07/25/2011  *RADIOLOGY REPORT*  Clinical Data: 66 year old female with shortness of breath and elevated D-dimer.  CT ANGIOGRAPHY CHEST  Technique:  Multidetector CT imaging of the chest using the standard protocol during bolus administration of intravenous contrast. Multiplanar reconstructed images including MIPs were obtained and reviewed to evaluate the vascular anatomy.  Contrast: 154mL OMNIPAQUE IOHEXOL 350 MG/ML IV SOLN  Comparison: 04/25/2011  Findings: This is a technically adequate study but respiratory motion artifact slightly limit sensitivity, especially in the lower lungs.  No pulmonary emboli are identified. There is no evidence of thoracic aortic aneurysm.  Mild cardiomegaly is present.  Very small bilateral pleural effusions are identified. There is no evidence of pericardial effusion. No enlarged or abnormal-appearing lymph nodes are present. Shotty  mediastinal and hilar lymph nodes are relatively unchanged.  Ground-glass opacities bilaterally are primarily central and likely represent interstitial pulmonary edema. Bibasilar atelectasis is identified. There is no evidence of suspicious nodule or mass.  No endobronchial or endotracheal lesions are identified.  No acute or suspicious bony abnormalities are noted.  IMPRESSION: Bilateral central ground-glass opacities likely representing interstitial pulmonary edema.  With cardiomegaly and small bilateral pleural effusions these findings are compatible with congestive heart failure.  No evidence of pulmonary emboli or thoracic aortic aneurysm.  Bibasilar atelectasis.  Original Report Authenticated By: Lura Em, M.D.   Dg Chest Portable 1 View  07/25/2011  *RADIOLOGY REPORT*  Clinical Data: Shortness of breath.  PORTABLE CHEST - 1 VIEW  Comparison: None.  Findings: Heart is mildly enlarged.  Vascular congestion.  Diffuse peribronchial thickening.  No confluent opacities or effusions.  IMPRESSION: Cardiomegaly with vascular congestion.  Diffuse peribronchial thickening, likely bronchitis.  Original Report Authenticated By: Raelyn Number, M.D.    Brief H and P: For complete details please refer to admission H and P, but in brief This is a 66 year old female who presents to the emergency room with complaints of shortness of breath. She reports being recently diagnosed with congestive heart failure and was placed on Lasix. Apparently approximately 3 months ago she was taken off her Lasix due to dehydration. Since being off the Lasix she has a gradual decline in her respiratory status. He was initially noted when she was having difficulty with dyspnea on exertion. This has progressively gotten worse. Last night she reports that she was unable to sleep due to shortness of breath. She noted to be wheezing. She's had a mild nonproductive cough. She denies any fever. No chest pain. She denies any sick contacts.  She has not noted any significant edema in her lower extremities. She sleeps with 2 pillows. She was recently started on CPAP for obstructive sleep apnea. She reports increasingly fatigued, tired. She is frustrated from being sick for the past 2 months. She reports seeing multiple doctors and has been told different things such as she has congestive heart failure and that she has pneumonia.  Patient was evaluated in the emergency room where she was noted to be hypoxic with oxygen saturations in the high 80s on room air, chest x-ray showed no signs of fluid overload as well as possible bronchitis. She did receive some Lasix in the emergency room and has had urine output. She reports an improvement in her symptoms.     Hospital Course:  Patient was initially admitted with hypoxia, shortness of breath and acute on chronic CHF.  She was started on intravenous lasix and has had good urine output.  Symptomatically she has significantly improved.  She feels back to her baseline.  She is ambulating without any oxygen and is breathing comfortably. She had a 2D echo which showed a preserved EF and no wall motion abnormalities.  She will be set up to be seen in the cardiology clinic as an outpatient.  Her TSH was checked and was found to be mildly elevated at 5.2, this should be repeated in 4-6 weeks.  She will continue on CPAP therapy for her OSA.  Her anemia is mild and can also be followed as an outpatient. Her blood sugars have remained stable. The remainder of her chronic medical conditions have remained stable.    Time spent on Discharge: 50mins  Signed: Vikki Gains [facility] Pager: [phone] 07/27/2011, 12:26 PM

## 2011-07-27 NOTE — Discharge Instructions (Signed)
Heart Failure Heart failure (HF) is a condition in which the heart has trouble pumping blood. This means your heart does not pump blood efficiently for your body to work well. In some cases of HF, fluid may back up into your lungs or you may have swelling (edema) in your lower legs. HF is a long-term (chronic) condition. It is important for you to take good care of yourself and follow your caregiver's treatment plan. CAUSES   Health conditions:   High blood pressure (hypertension) causes the heart muscle to work harder than normal. When pressure in the blood vessels is high, the heart needs to pump (contract) with more force in order to circulate blood throughout the body. High blood pressure eventually causes the heart to become stiff and weak.   Coronary artery disease (CAD) is the buildup of cholesterol and fat (plaques) in the arteries of the heart. The blockage in the arteries deprives the heart muscle of oxygen and blood. This can cause chest pain and may lead to a heart attack. High blood pressure can also contribute to CAD.   Heart attack (myocardial infarction) occurs when 1 or more arteries in the heart become blocked. The loss of oxygen damages the muscle tissue of the heart. When this happens, part of the heart muscle dies. The injured tissue does not contract as well and weakens the heart's ability to pump blood.   Abnormal heart valves can cause HF when the heart valves do not open and close properly. This makes the heart muscle pump harder to keep the blood flowing.   Heart muscle disease (cardiomyopathy or myocarditis) is damage to the heart muscle from a variety of causes. These can include drug or alcohol abuse, infections, or unknown reasons. These can increase the risk of HF.   Lung disease makes the heart work harder because the lungs do not work properly. This can cause a strain on the heart leading it to fail.   Diabetes increases the risk of HF. High blood sugar contributes  to high fat (lipid) levels in the blood. Diabetes can also cause slow damage to tiny blood vessels that carry important nutrients to the heart muscle. When the heart does not get enough oxygen and food, it can cause the heart to become weak and stiff. This leads to a heart that does not contract efficiently.   Other diseases can contribute to HF. These include abnormal heart rhythms, thyroid problems, and low blood counts (anemia).   Unhealthy lifestyle habits:   Obesity.   Smoking.   Eating foods high in fat and cholesterol.   Eating or drinking beverages high in salt.   Drug or alcohol abuse.   Lack of exercise.  SYMPTOMS  HF symptoms may vary and can be hard to detect. Symptoms may include:  Shortness of breath with activity, such as climbing stairs.   Persistent cough.   Swelling of the feet, ankles, legs, or abdomen.   Unexplained weight gain.   Difficulty breathing when lying flat.   Waking from sleep because of the need to sit up and get more air.   Rapid heartbeat.   Fatigue and loss of energy.   Feeling lightheaded or close to fainting.  DIAGNOSIS  A diagnosis of HF is based on your history, symptoms, physical examination, and diagnostic tests. Diagnostic tests for HF may include:  EKG.   Chest X-ray.   Blood tests.   Exercise stress test.   Blood oxygen test (arterial blood gas).   Evaluation   by a heart doctor (cardiologist).   Ultrasound evaluation of the heart (echocardiogram).   Heart artery test to look for blockages (angiogram).   Radioactive imaging to look at the heart (radionuclide test).  TREATMENT  Treatment is aimed at managing the symptoms of HF. Medicines, lifestyle changes, or surgical intervention may be necessary to treat HF.  Medicines to help treat HF may include:   Angiotensin-converting enzyme (ACE) inhibitors. These block the effects of a blood protein called angiotensin-converting enzyme. ACE inhibitors relax (dilate) the  blood vessels and help lower blood pressure. This decreases the workload of the heart, slows the progression of HF, and improves symptoms.   Angiotensin receptor blockers (ARBs). These medications work similar to ACE inhibitors. ARBs may be an alternative for people who cannot tolerate an ACE inhibitor.   Aldosterone antagonists. This medication helps get rid of extra fluid from your body. This lowers the volume of blood the heart has to pump.   Water pills (diuretics). Diuretics cause the kidneys to remove salt and water from the blood. The extra fluid is removed by urination. By removing extra fluid from the body, diuretics help lower the workload of the heart and help prevent fluid buildup in the lungs so breathing is easier.   Beta blockers. These prevent the heart from beating too fast and improve heart muscle strength. Beta blockers help maintain a normal heart rate, control blood pressure, and improve HF symptoms.   Digitalis. This increases the force of the heartbeat and may be helpful to people with HF or heart rhythm problems.   Healthy lifestyle changes include:   Stopping smoking.   Eating a healthy diet. Avoid foods high in fat. Avoid foods fried in oil or made with fat. A dietician can help with healthy food choices.   Limiting how much salt you eat.   Limiting alcohol intake to no more than 1 drink per day for women and 2 drinks per day for men. Drinking more than that is harmful to your heart. If your heart has already been damaged by alcohol or you have severe HF, drinking alcohol should be stopped completely.   Exercising as directed by your caregiver.   Surgical treatment for HF may include:   Procedures to open blocked arteries, repair damaged heart valves, or remove damaged heart muscle tissue.   A pacemaker to help heart muscle function and to control certain abnormal heart rhythms.   A defibrillator to possibly prevent sudden cardiac death.  HOME CARE  INSTRUCTIONS   Activity level. Your caregiver can help you determine what type of exercise program may be helpful. It is important to maintain your strength. Pace your physical activity to avoid shortness of breath or chest pain. Rest for 1 hour before and after meals. A cardiac rehabilitation program may be helpful to some people with HF.   Diet. Eat a heart healthy diet. Food choices should be low in saturated fat and cholesterol. Talk to a dietician to learn about heart healthy foods.   Salt intake. When you have HF, you need to limit the amount of salt you eat. Eat less than 1500 milligrams (mg) of salt per day or as recommended by your caregiver.   Weight monitoring. Weigh yourself every day. You should weigh yourself in the morning after you urinate and before you eat breakfast. Wear the same amount of clothing each time you weigh yourself. Record your weight daily. Bring your recorded weights to your clinic visits. Tell your caregiver right away if   you have gained 3 lb/1.4 kg in 1 day, or 5 lb/2.3 kg in a week or whatever amount you were told to report.   Blood pressure monitoring. This should be done as directed by your caregiver. A home blood pressure cuff can be purchased at a drugstore. Record your blood pressure numbers and bring them to your clinic visits. Tell your caregiver if you become dizzy or lightheaded upon standing up.   Smoking. If you are currently a smoker, it is time to quit. Nicotine makes your heart work harder by causing your blood vessels to constrict. Do not use nicotine gum or patches before talking to your caregiver.   Follow up. Be sure to schedule a follow-up visit with your caregiver. Keep all your appointments.  SEEK MEDICAL CARE IF:   Your weight increases by 3 lb/1.4 kg in 1 day or 5 lb/2.3 kg in a week.   You notice increasing shortness of breath that is unusual for you. This may happen during rest, sleep, or with activity.   You cough more than normal,  especially with physical activity.   You notice more swelling in your hands, feet, ankles, or belly (abdomen).   You are unable to sleep because it is hard to breathe.   You cough up bloody mucus (sputum).   You begin to feel "jumping" or "fluttering" sensations (palpitations) in your chest.  SEEK IMMEDIATE MEDICAL CARE IF:   You have severe chest pain or pressure which may include symptoms such as:   Pain or pressure in the arms, neck, jaw, or back.   Feeling sweaty.   Feeling sick to your stomach (nauseous).   Feeling short of breath while at rest.   Having a fast or irregular heartbeat.   You experience stroke symptoms. These symptoms include:   Facial weakness or numbness.   Weakness or numbness in an arm, leg, or on one side of your body.   Blurred vision.   Difficulty talking or thinking.   Dizziness or fainting.   Severe headache.  THESE ARE MEDICAL EMERGENCIES. Do not wait to see if the symptoms go away. Call your local emergency services (911 in [place] DO NOT drive yourself to the hospital. IMPORTANT  Make a list of every medicine, vitamin, or herbal supplement you are taking. Keep the list with you at all times. Show it to your caregiver at every visit. Keep the list up-to-date.   Ask your caregiver or pharmacist to write an explanation of each medicine you are taking. This should include:   Why you are taking it.   The possible side effects.   The best time of day to take it.   Foods to take with it or what foods to avoid.   When to stop taking it.  MAKE SURE YOU:   Understand these instructions.   Will watch your condition.   Will get help right away if you are not doing well or get worse.  Document Released: 05/17/2005 Document Revised: 01/27/2011 Document Reviewed: 08/29/2009 [employer] Patient Information 2012 [employer]. 

## 2011-07-28 NOTE — Progress Notes (Signed)
Utilization review completed.  

## 2011-08-02 ENCOUNTER — Encounter: Payer: Self-pay | Admitting: Adult Health

## 2011-08-04 ENCOUNTER — Encounter: Payer: Self-pay | Admitting: Adult Health

## 2011-08-04 ENCOUNTER — Ambulatory Visit (INDEPENDENT_AMBULATORY_CARE_PROVIDER_SITE_OTHER): Payer: PRIVATE HEALTH INSURANCE | Admitting: Adult Health

## 2011-08-04 VITALS — BP 128/67 | HR 87 | Resp 18 | Ht 70.0 in | Wt 299.0 lb

## 2011-08-04 DIAGNOSIS — I509 Heart failure, unspecified: Secondary | ICD-10-CM

## 2011-08-04 DIAGNOSIS — R000 Tachycardia, unspecified: Secondary | ICD-10-CM

## 2011-08-04 DIAGNOSIS — G4733 Obstructive sleep apnea (adult) (pediatric): Secondary | ICD-10-CM

## 2011-08-04 MED ORDER — METOPROLOL SUCCINATE ER 50 MG PO TB24: ORAL_TABLET | ORAL | Status: DC

## 2011-08-04 NOTE — Assessment & Plan Note (Signed)
She has not been using CPAP as directed, and not sleeping well. I have encouraged her to use this. She is to follow-up with her PCP for further treatment.

## 2011-08-04 NOTE — Patient Instructions (Signed)
Your physician recommends that you schedule a follow-up appointment in: 3 months  Low salt diet (information enclosed)  Your physician has recommended you make the following change in your medication:  Change Metoprolol to 75 mg each evening

## 2011-08-04 NOTE — Assessment & Plan Note (Signed)
She has had episodes of documented tachycardia while hospitalized and was treated with rate reducing medications which led to hypotension. She was tolerating metoprolol 50 mg XL daily, and had recent increase of this per PCP this am. I do not think she can tolerate this dose as she is already suffering from fatigue and HR is WNL. I will change her dose to 75 mg and give it at HS, hopefully will tolerate this better.  She is easily anxious which may be contributing to HR. No evidence of cardiomyopathy on recent echo in January. Will follow in 3 months.

## 2011-08-04 NOTE — Progress Notes (Signed)
HPI: Mary [name] is a 66 y/o patient of [name] with multiple medical problems with recent hospitalization for UTI, A/C diastolic CHF, with known history of diabetes, OSA, chronic bronchitis, anemia. [name] treats her for SVT. During hospitalization echo was completed with EF range of  55%-60%. Normal wall motion.  She has been seen by her PCP, [name] in [place] who has increased her metoprolol to 100mg  daily. She has not taken that dose yet. She is without cardiac complaints.  No Known Allergies  Current Outpatient Prescriptions  Medication Sig Dispense Refill  . aspirin EC 81 MG EC tablet Take 1 tablet (81 mg total) by mouth daily.  30 tablet  1  . cholecalciferol (VITAMIN D) 1000 UNITS tablet Take 1,000 Units by mouth 2 (two) times daily.      . cyclobenzaprine (FLEXERIL) 5 MG tablet Take 5-10 mg by mouth 3 (three) times daily as needed. FOR MUSCLE PAIN      . furosemide (LASIX) 40 MG tablet Take 1 tablet (40 mg total) by mouth daily.  30 tablet  1  . insulin detemir (LEVEMIR) 100 UNIT/ML injection Inject 50 Units into the skin 2 (two) times daily.      . insulin lispro (HUMALOG) 100 UNIT/ML injection Inject 100 Units into the skin 3 (three) times daily before meals.      Marland Kitchen lisinopril (PRINIVIL,ZESTRIL) 20 MG tablet Take 20 mg by mouth daily.      . metFORMIN (GLUCOPHAGE) 500 MG tablet Take 500 mg by mouth 2 (two) times daily with a meal. DO NOT START UNTIL 07-28-11 DUE TO PROCEDURE      . metoprolol succinate (TOPROL-XL) 50 MG 24 hr tablet Take 50 mg by mouth daily. Take with or immediately following a meal.      . omeprazole (PRILOSEC) 20 MG capsule Take 20 mg by mouth daily.      . potassium chloride (K-DUR) 10 MEQ tablet Take 2 tablets (20 mEq total) by mouth daily.  30 tablet  1  . pregabalin (LYRICA) 75 MG capsule Take 150 mg by mouth at bedtime.      Marland Kitchen rOPINIRole (REQUIP) 0.5 MG tablet Take 0.5 mg by mouth at bedtime.      . simvastatin (ZOCOR) 40 MG tablet Take 40 mg  by mouth every evening.      . traMADol (ULTRAM) 50 MG tablet Take 50 mg by mouth 4 (four) times daily as needed. FOR PAIN        Past Medical History  Diagnosis Date  . CHF (congestive heart failure)   . Diabetes mellitus   . Restless leg syndrome 07/25/2011  . OSA (obstructive sleep apnea) 07/25/2011    Past Surgical History  Procedure Date  . Abdominal hysterectomy   . Achilles tendon repair     [id number] of systems complete and found to be negative unless listed above PHYSICAL EXAM BP 128/67  Pulse 87  Resp 18  Ht 5\' 10"  (1.778 m)  Wt 299 lb (135.626 kg)  BMI 42.90 kg/m2  LMP 07/25/2011  General: Well developed, well nourished, morbidly obese in no acute distress Head: Eyes PERRLA, No xanthomas.   Normal cephalic and atramatic  Lungs: Clear bilaterally to auscultation and percussion. Heart: HRRR S1 S2, .  Pulses are 2+ & equal.            No carotid bruit. No JVD.  No abdominal bruits. No femoral bruits. Abdomen: Bowel sounds are positive, abdomen soft and non-tender without masses or  Hernia's noted. Msk:  Back normal, normal gait. Normal strength and tone for age. Extremities: No clubbing, cyanosis or edema.  DP +1 Neuro: Alert and oriented X 3. Psych:  Good affect, responds appropriately  EKG: SR with rate of 86 bpm.:  ASSESSMENT AND PLAN

## 2011-08-04 NOTE — Assessment & Plan Note (Signed)
No evidence of fluid retention at this time. Low salt diet is provided for her review

## 2011-11-08 ENCOUNTER — Other Ambulatory Visit: Payer: Self-pay

## 2011-11-08 ENCOUNTER — Ambulatory Visit (INDEPENDENT_AMBULATORY_CARE_PROVIDER_SITE_OTHER): Payer: Medicare Other | Admitting: Adult Health

## 2011-11-08 ENCOUNTER — Encounter: Payer: Self-pay | Admitting: Adult Health

## 2011-11-08 VITALS — BP 128/69 | HR 107 | Ht 70.0 in | Wt 297.0 lb

## 2011-11-08 DIAGNOSIS — R000 Tachycardia, unspecified: Secondary | ICD-10-CM

## 2011-11-08 MED ORDER — METOPROLOL SUCCINATE ER 100 MG PO TB24: 100.0000 mg | ORAL_TABLET | Freq: Every day | ORAL | Status: DC

## 2011-11-08 NOTE — Assessment & Plan Note (Signed)
She is tachycardic today. Has not taken medications and is anxious about current financial situation and lack of a job. She comes with multiple somatic complaints. No changes in her medications. No further testing planned. Will see her in 6 months.

## 2011-11-08 NOTE — Progress Notes (Signed)
HPI: Mary [name] is a 66 y/o patient of [name] we are following with multiple medical problems and multiple somatic complaints. From a cardiac standpoint, we are treating her for history of SVT, and diastolic CHF. She has recent surgery to remove her right great toe, and has lost her job because of lengthy amount of time to recover. She is very anxious. She has not taken her medications today. She denies any cardiac symptoms today.  No Known Allergies  Current Outpatient Prescriptions  Medication Sig Dispense Refill  . aspirin EC 81 MG EC tablet Take 1 tablet (81 mg total) by mouth daily.  30 tablet  1  . cholecalciferol (VITAMIN D) 1000 UNITS tablet Take 1,000 Units by mouth 2 (two) times daily.      . cyclobenzaprine (FLEXERIL) 5 MG tablet Take 5-10 mg by mouth 3 (three) times daily as needed. FOR MUSCLE PAIN      . ferrous sulfate 325 (65 FE) MG EC tablet Take 325 mg by mouth 3 (three) times daily with meals.      . furosemide (LASIX) 40 MG tablet Take 1 tablet (40 mg total) by mouth daily.  30 tablet  1  . insulin detemir (LEVEMIR) 100 UNIT/ML injection Inject 50 Units into the skin 2 (two) times daily.      . insulin lispro (HUMALOG) 100 UNIT/ML injection Inject 100 Units into the skin 3 (three) times daily before meals.      Marland Kitchen lisinopril (PRINIVIL,ZESTRIL) 20 MG tablet Take 20 mg by mouth daily.      . metFORMIN (GLUCOPHAGE) 1000 MG tablet Take 1,000 mg by mouth 2 (two) times daily with a meal.      . pregabalin (LYRICA) 25 MG capsule Take 25 mg by mouth daily.      . pregabalin (LYRICA) 75 MG capsule Take 225 mg by mouth at bedtime.      Marland Kitchen rOPINIRole (REQUIP) 0.5 MG tablet Take 0.5 mg by mouth at bedtime.      . simvastatin (ZOCOR) 40 MG tablet Take 40 mg by mouth every evening.      . traMADol (ULTRAM) 50 MG tablet Take 50 mg by mouth 4 (four) times daily as needed. FOR PAIN      . zolpidem (AMBIEN) 10 MG tablet Take 10 mg by mouth at bedtime as needed.      . metoprolol  succinate (TOPROL-XL) 100 MG 24 hr tablet Take 1 tablet (100 mg total) by mouth daily. Take with or immediately following a meal.  30 tablet  6    Past Medical History  Diagnosis Date  . CHF (congestive heart failure)   . Diabetes mellitus   . Restless leg syndrome 07/25/2011  . OSA (obstructive sleep apnea) 07/25/2011    Past Surgical History  Procedure Date  . Abdominal hysterectomy   . Achilles tendon repair     ROS Review of systems complete and found to be negative unless listed above  PHYSICAL EXAM BP 128/69  Pulse 107  Ht 5\' 10"  (1.778 m)  Wt 297 lb (134.718 kg)  BMI 42.61 kg/m2  LMP 07/25/2011  General: Well developed, well nourished, in no acute distress Head: Eyes PERRLA, No xanthomas.   Normal cephalic and atraumatic Lungs: Clear bilaterally to auscultation and percussion. Heart: HRRR S1 S2, tachycardic without MRG.  Pulses are 2+ & equal.            No carotid bruit. No JVD.  No abdominal bruits. No femoral bruits. Abdomen:  Bowel sounds are positive, abdomen soft and non-tender without masses or                  Hernia's noted. Msk:  Back normal, normal gait. Normal strength and tone for age. Extremities: No clubbing, cyanosis or edema.  DP +1. Ace dressing to right foot, with walking cast noted. Bilaterally nonpitting edema is noted in the LE.  Neuro: Alert and oriented X 3. Psych:  Good affect, responds appropriately    ASSESSMENT AND PLAN

## 2011-11-08 NOTE — Patient Instructions (Signed)
**Note De-Identified  Obfuscation** Your physician recommends that you continue on your current medications as directed. Please refer to the Current Medication list given to you today.  Your physician recommends that you schedule a follow-up appointment in: 6 MONTHS

## 2011-11-11 ENCOUNTER — Encounter (HOSPITAL_COMMUNITY): Payer: Self-pay

## 2011-11-11 ENCOUNTER — Inpatient Hospital Stay (HOSPITAL_COMMUNITY)
Admission: EM | Admit: 2011-11-11 | Discharge: 2011-11-12 | DRG: 641 | Disposition: A | Discharge status: Home / Self Care | Payer: Medicare Other | Attending: Internal Medicine | Admitting: Internal Medicine

## 2011-11-11 DIAGNOSIS — E119 Type 2 diabetes mellitus without complications: Secondary | ICD-10-CM

## 2011-11-11 DIAGNOSIS — Z713 Dietary counseling and surveillance: Secondary | ICD-10-CM

## 2011-11-11 DIAGNOSIS — G4730 Sleep apnea, unspecified: Secondary | ICD-10-CM

## 2011-11-11 DIAGNOSIS — I10 Essential (primary) hypertension: Secondary | ICD-10-CM | POA: Diagnosis present

## 2011-11-11 DIAGNOSIS — G4733 Obstructive sleep apnea (adult) (pediatric): Secondary | ICD-10-CM | POA: Diagnosis present

## 2011-11-11 DIAGNOSIS — G4710 Hypersomnia, unspecified: Secondary | ICD-10-CM

## 2011-11-11 DIAGNOSIS — Z79899 Other long term (current) drug therapy: Secondary | ICD-10-CM

## 2011-11-11 DIAGNOSIS — Z7982 Long term (current) use of aspirin: Secondary | ICD-10-CM

## 2011-11-11 DIAGNOSIS — E875 Hyperkalemia: Secondary | ICD-10-CM

## 2011-11-11 DIAGNOSIS — Z6841 Body Mass Index (BMI) 40.0 and over, adult: Secondary | ICD-10-CM

## 2011-11-11 DIAGNOSIS — G2581 Restless legs syndrome: Secondary | ICD-10-CM | POA: Diagnosis present

## 2011-11-11 DIAGNOSIS — I509 Heart failure, unspecified: Secondary | ICD-10-CM | POA: Diagnosis present

## 2011-11-11 DIAGNOSIS — Z794 Long term (current) use of insulin: Secondary | ICD-10-CM

## 2011-11-11 DIAGNOSIS — T46905A Adverse effect of unspecified agents primarily affecting the cardiovascular system, initial encounter: Secondary | ICD-10-CM | POA: Diagnosis present

## 2011-11-11 DIAGNOSIS — T370X5A Adverse effect of sulfonamides, initial encounter: Secondary | ICD-10-CM | POA: Diagnosis present

## 2011-11-11 DIAGNOSIS — S98119A Complete traumatic amputation of unspecified great toe, initial encounter: Secondary | ICD-10-CM

## 2011-11-11 DIAGNOSIS — E785 Hyperlipidemia, unspecified: Secondary | ICD-10-CM | POA: Diagnosis present

## 2011-11-11 LAB — POTASSIUM: Potassium: 5.1 mEq/L (ref 3.5–5.1)

## 2011-11-11 LAB — DIFFERENTIAL
Basophils Absolute: 0 10*3/uL (ref 0.0–0.1)
Lymphocytes Relative: 23 % (ref 12–46)
Lymphs Abs: 1.4 10*3/uL (ref 0.7–4.0)
Monocytes Absolute: 0.8 10*3/uL (ref 0.1–1.0)
Neutro Abs: 3.5 10*3/uL (ref 1.7–7.7)

## 2011-11-11 LAB — CBC
HCT: 32.4 % — ABNORMAL LOW (ref 36.0–46.0)
Hemoglobin: 10.3 g/dL — ABNORMAL LOW (ref 12.0–15.0)
RBC: 3.89 MIL/uL (ref 3.87–5.11)
RDW: 15.5 % (ref 11.5–15.5)
WBC: 6 10*3/uL (ref 4.0–10.5)

## 2011-11-11 LAB — GLUCOSE, CAPILLARY
Glucose-Capillary: 209 mg/dL — ABNORMAL HIGH (ref 70–99)
Glucose-Capillary: 202 mg/dL — ABNORMAL HIGH (ref 70–99)

## 2011-11-11 LAB — BASIC METABOLIC PANEL
Chloride: 104 mEq/L (ref 96–112)
Creatinine, Ser: 0.95 mg/dL (ref 0.50–1.10)
GFR calc Af Amer: 71 mL/min — ABNORMAL LOW (ref >90)
Potassium: 6.4 mEq/L (ref 3.5–5.1)
Sodium: 135 mEq/L (ref 135–145)

## 2011-11-11 MED ORDER — METOPROLOL SUCCINATE ER 50 MG PO TB24
100.0000 mg | ORAL_TABLET | Freq: Every day | ORAL | Status: DC
  Administered 2011-11-11 – 2011-11-12 (×2): 100 mg via ORAL
  Filled 2011-11-11 (×2): qty 2

## 2011-11-11 MED ORDER — ONDANSETRON HCL 4 MG/2ML IJ SOLN: 4.0000 mg | Freq: Four times a day (QID) | INTRAMUSCULAR | Status: DC | PRN

## 2011-11-11 MED ORDER — ACETAMINOPHEN 325 MG PO TABS
650.0000 mg | ORAL_TABLET | Freq: Four times a day (QID) | ORAL | Status: DC | PRN
Start: 2011-11-11 — End: 2011-11-12

## 2011-11-11 MED ORDER — DEXTROSE 50 % IV SOLN
1.0000 | Freq: Once | INTRAVENOUS | Status: AC
  Administered 2011-11-11: 50 mL via INTRAVENOUS

## 2011-11-11 MED ORDER — SODIUM POLYSTYRENE SULFONATE 15 GM/60ML PO SUSP
45.0000 g | ORAL | Status: AC
  Administered 2011-11-11 (×2): 45 g via ORAL
  Filled 2011-11-11 (×2): qty 180

## 2011-11-11 MED ORDER — SIMVASTATIN 20 MG PO TABS
40.0000 mg | ORAL_TABLET | Freq: Every day | ORAL | Status: DC
  Administered 2011-11-11: 40 mg via ORAL
  Filled 2011-11-11: qty 2

## 2011-11-11 MED ORDER — CYCLOBENZAPRINE HCL 10 MG PO TABS
5.0000 mg | ORAL_TABLET | Freq: Three times a day (TID) | ORAL | Status: DC | PRN
  Filled 2011-11-11: qty 2

## 2011-11-11 MED ORDER — INSULIN ASPART 100 UNIT/ML ~~LOC~~ SOLN
0.0000 [IU] | Freq: Three times a day (TID) | SUBCUTANEOUS | Status: DC
  Administered 2011-11-11: 7 [IU] via SUBCUTANEOUS
  Administered 2011-11-12: 3 [IU] via SUBCUTANEOUS

## 2011-11-11 MED ORDER — ROPINIROLE HCL 1 MG PO TABS
0.5000 mg | ORAL_TABLET | Freq: Every day | ORAL | Status: DC
  Administered 2011-11-11: 0.5 mg via ORAL
  Filled 2011-11-11 (×4): qty 1

## 2011-11-11 MED ORDER — SODIUM CHLORIDE 0.9 % IJ SOLN
3.0000 mL | Freq: Two times a day (BID) | INTRAMUSCULAR | Status: DC
  Administered 2011-11-11 – 2011-11-12 (×2): 3 mL via INTRAVENOUS
  Filled 2011-11-11: qty 3

## 2011-11-11 MED ORDER — ZOLPIDEM TARTRATE 5 MG PO TABS
5.0000 mg | ORAL_TABLET | Freq: Every evening | ORAL | Status: DC | PRN
  Administered 2011-11-12: 5 mg via ORAL
  Filled 2011-11-11: qty 1

## 2011-11-11 MED ORDER — ONDANSETRON HCL 4 MG PO TABS
4.0000 mg | ORAL_TABLET | Freq: Four times a day (QID) | ORAL | Status: DC | PRN
  Administered 2011-11-11: 4 mg via ORAL
  Filled 2011-11-11: qty 1

## 2011-11-11 MED ORDER — ROPINIROLE HCL 1 MG PO TABS
ORAL_TABLET | ORAL | Status: AC
  Filled 2011-11-11: qty 1

## 2011-11-11 MED ORDER — PREGABALIN 75 MG PO CAPS
225.0000 mg | ORAL_CAPSULE | Freq: Every day | ORAL | Status: DC
  Administered 2011-11-11: 225 mg via ORAL
  Filled 2011-11-11: qty 2
  Filled 2011-11-11: qty 1

## 2011-11-11 MED ORDER — CALCIUM CHLORIDE 10 % IV SOLN
1.0000 g | Freq: Once | INTRAVENOUS | Status: AC
  Administered 2011-11-11: 1 g via INTRAVENOUS
  Filled 2011-11-11: qty 10

## 2011-11-11 MED ORDER — INSULIN ASPART 100 UNIT/ML ~~LOC~~ SOLN
10.0000 [IU] | Freq: Once | SUBCUTANEOUS | Status: AC
  Administered 2011-11-11: 10 [IU] via SUBCUTANEOUS

## 2011-11-11 MED ORDER — FERROUS SULFATE 325 (65 FE) MG PO TABS
325.0000 mg | ORAL_TABLET | Freq: Three times a day (TID) | ORAL | Status: DC
  Administered 2011-11-11 – 2011-11-12 (×2): 325 mg via ORAL
  Filled 2011-11-11 (×3): qty 1

## 2011-11-11 MED ORDER — INSULIN REGULAR HUMAN 100 UNIT/ML IJ SOLN: 10.0000 [IU] | Freq: Once | INTRAMUSCULAR | Status: DC

## 2011-11-11 MED ORDER — PREGABALIN 25 MG PO CAPS
25.0000 mg | ORAL_CAPSULE | Freq: Every day | ORAL | Status: DC
  Administered 2011-11-11 – 2011-11-12 (×2): 25 mg via ORAL
  Filled 2011-11-11 (×2): qty 1

## 2011-11-11 MED ORDER — DOXYCYCLINE HYCLATE 100 MG PO TABS
100.0000 mg | ORAL_TABLET | Freq: Two times a day (BID) | ORAL | Status: DC
  Administered 2011-11-11 – 2011-11-12 (×2): 100 mg via ORAL
  Filled 2011-11-11 (×3): qty 1

## 2011-11-11 MED ORDER — INSULIN ASPART 100 UNIT/ML ~~LOC~~ SOLN
0.0000 [IU] | Freq: Every day | SUBCUTANEOUS | Status: DC
  Administered 2011-11-11: 2 [IU] via SUBCUTANEOUS

## 2011-11-11 MED ORDER — SODIUM BICARBONATE 8.4 % IV SOLN
50.0000 meq | Freq: Once | INTRAVENOUS | Status: AC
  Administered 2011-11-11: 50 meq via INTRAVENOUS
  Filled 2011-11-11: qty 50

## 2011-11-11 MED ORDER — DEXTROSE 50 % IV SOLN
INTRAVENOUS | Status: AC
  Filled 2011-11-11: qty 50

## 2011-11-11 MED ORDER — SODIUM CHLORIDE 0.9 % IV SOLN
INTRAVENOUS | Status: AC
  Administered 2011-11-11: 12:00:00 via INTRAVENOUS

## 2011-11-11 MED ORDER — INSULIN ASPART 100 UNIT/ML ~~LOC~~ SOLN
SUBCUTANEOUS | Status: AC
  Filled 2011-11-11: qty 1

## 2011-11-11 MED ORDER — INSULIN DETEMIR 100 UNIT/ML ~~LOC~~ SOLN
50.0000 [IU] | Freq: Two times a day (BID) | SUBCUTANEOUS | Status: DC
  Administered 2011-11-12 (×2): 50 [IU] via SUBCUTANEOUS
  Filled 2011-11-11: qty 10

## 2011-11-11 MED ORDER — TRAMADOL HCL 50 MG PO TABS
50.0000 mg | ORAL_TABLET | Freq: Four times a day (QID) | ORAL | Status: DC | PRN
  Administered 2011-11-12: 50 mg via ORAL
  Filled 2011-11-11: qty 1

## 2011-11-11 MED ORDER — ACETAMINOPHEN 650 MG RE SUPP: 650.0000 mg | Freq: Four times a day (QID) | RECTAL | Status: DC | PRN

## 2011-11-11 MED ORDER — ENOXAPARIN SODIUM 40 MG/0.4ML ~~LOC~~ SOLN
40.0000 mg | SUBCUTANEOUS | Status: DC
  Administered 2011-11-11: 40 mg via SUBCUTANEOUS
  Filled 2011-11-11: qty 0.4

## 2011-11-11 MED ORDER — PNEUMOCOCCAL VAC POLYVALENT 25 MCG/0.5ML IJ INJ
0.5000 mL | INJECTION | INTRAMUSCULAR | Status: AC
  Administered 2011-11-12: 0.5 mL via INTRAMUSCULAR
  Filled 2011-11-11: qty 0.5

## 2011-11-11 MED ORDER — ASPIRIN EC 81 MG PO TBEC
81.0000 mg | DELAYED_RELEASE_TABLET | Freq: Every day | ORAL | Status: DC
  Administered 2011-11-11 – 2011-11-12 (×2): 81 mg via ORAL
  Filled 2011-11-11 (×2): qty 1

## 2011-11-11 NOTE — ED Notes (Signed)
CRITICAL VALUE ALERT  Critical value received:  Potassium 6.4  Date of notification:  11/11/11  Time of notification:  [id number]  Critical value read back: yes  Nurse who received alert:  Tomie China  MD notified (1st page):  1117  Time of first page:  1117  MD notified (2nd page):  Time of second page:  Responding MD:  Dr. Roderic Palau  Time MD responded:  [phone]

## 2011-11-11 NOTE — ED Notes (Signed)
Pt has stitches in place right great toe from surgery 3 weeks ago. No drainage noted. Pt also has dime size ulcer in place on bottom of right heel with no drainage noted. Inside of ulcer white and red. No c/o pain.

## 2011-11-11 NOTE — ED Notes (Signed)
Family at bedside. 

## 2011-11-11 NOTE — Progress Notes (Signed)
SKin assessment performed with c. Danelle Earthly, RN.  R big toe amputated recently - stitiches still in place.  Dry and intact.  Diabetic ulcer to R heel - scant green drainage.  Two small scratches to abdomen, pt states accidentally scratched herself with insulin needle.  Scattered moles.  Otherwise, skin intact and WNL

## 2011-11-11 NOTE — ED Provider Notes (Cosign Needed)
History   This chart was scribed for Maudry Diego, MD by Carolyne Littles. The patient was seen in room APA05/APA05. Patient's care was started at 1005.    CSN: [id number]  Arrival date & time 11/11/11  1005   First MD Initiated Contact with Patient 11/11/11 1017      Chief Complaint  Patient presents with  . Illegal value: [    hyperkalemia per PCP    (Consider location/radiation/quality/duration/timing/severity/associated sxs/prior treatment) The history is provided by the patient.   Mary [name] is a 66 y.o. female who presents to the Emergency Department after referral to ED by PCP to be evaluated for an episode of moderate to severe hyperkalemia first recognized yesterday. Patient notes she was informed by PCP's office this morning that potassium level was measured at 6.7 after having blood drawn yesterday and she was to be evaluated at nearest ED. Denies associated chest pain, SOB, nausea, vomiting, palpitations. Patient recently had right great toe amputation and was placed on Bactrim. Patient with h/o CHF, diabetes, restless leg syndrome, abdominal hysterectomy.  Past Medical History  Diagnosis Date  . CHF (congestive heart failure)   . Diabetes mellitus   . Restless leg syndrome 07/25/2011  . OSA (obstructive sleep apnea) 07/25/2011    Past Surgical History  Procedure Date  . Abdominal hysterectomy   . Achilles tendon repair   . Toe amputation     partial rt great toe    No family history on file.  History  Substance Use Topics  . Smoking status: Never Smoker   . Smokeless tobacco: Not on file  . Alcohol Use: No    OB History    Grav Para Term Preterm Abortions TAB SAB Ect Mult Living                  Review of Systems  Constitutional: Negative for fatigue.       Elevated Potassium lab.   HENT: Negative for congestion, sinus pressure and ear discharge.   Eyes: Negative for discharge.  Respiratory: Negative for cough.   Cardiovascular: Negative for  chest pain.  Gastrointestinal: Negative for abdominal pain and diarrhea.  Genitourinary: Negative for frequency and hematuria.  Musculoskeletal: Negative for back pain.  Skin: Negative for rash.  Neurological: Negative for seizures and headaches.  Hematological: Negative.   Psychiatric/Behavioral: Negative for hallucinations.    Allergies  Review of patient's allergies indicates no known allergies.  Home Medications   Current Outpatient Rx  Name Route Sig Dispense Refill  . ASPIRIN 81 MG PO TBEC Oral Take 1 tablet (81 mg total) by mouth daily. 30 tablet 1  . VITAMIN D 1000 UNITS PO TABS Oral Take 1,000 Units by mouth 2 (two) times daily.    . CYCLOBENZAPRINE HCL 5 MG PO TABS Oral Take 5-10 mg by mouth 3 (three) times daily as needed. FOR MUSCLE PAIN    . FERROUS SULFATE 325 (65 FE) MG PO TBEC Oral Take 325 mg by mouth 3 (three) times daily with meals.    . INSULIN DETEMIR 100 UNIT/ML [place] SOLN Subcutaneous Inject 50 Units into the skin 2 (two) times daily.    . INSULIN LISPRO PROT & LISPRO (50-50) 100 UNIT/ML [place] SUSP Subcutaneous Inject 50-100 Units into the skin 3 (three) times daily with meals. Sliding scale per MD    . LISINOPRIL 20 MG PO TABS Oral Take 20 mg by mouth daily.    Marland Kitchen METFORMIN HCL 1000 MG PO TABS Oral Take 1,000 mg  by mouth 2 (two) times daily with a meal.    . METOPROLOL SUCCINATE ER 100 MG PO TB24 Oral Take 1 tablet (100 mg total) by mouth daily. Take with or immediately following a meal. 30 tablet 6  . PREGABALIN 25 MG PO CAPS Oral Take 25 mg by mouth daily.    Marland Kitchen PREGABALIN 75 MG PO CAPS Oral Take 225 mg by mouth at bedtime.    Marland Kitchen ROPINIROLE HCL 0.5 MG PO TABS Oral Take 0.5 mg by mouth at bedtime.    Marland Kitchen SIMVASTATIN 40 MG PO TABS Oral Take 40 mg by mouth every evening.    Marland Kitchen TRAMADOL HCL 50 MG PO TABS Oral Take 50 mg by mouth 4 (four) times daily as needed. FOR PAIN    . ZOLPIDEM TARTRATE 10 MG PO TABS Oral Take 10 mg by mouth at bedtime as needed.      BP 117/42   Pulse 87  Temp 97.9 F (36.6 C)  Resp 21  Ht 5\' 10"  (1.778 m)  Wt 290 lb (131.543 kg)  BMI 41.61 kg/m2  SpO2 98%  LMP 07/25/2011  Physical Exam  Nursing note and vitals reviewed. Constitutional: She is oriented to person, place, and time. She appears well-developed and well-nourished. No distress.       Obese  HENT:  Head: Normocephalic and atraumatic.  Eyes: EOM are normal. Pupils are equal, round, and reactive to light.  Neck: Neck supple. No tracheal deviation present.  Cardiovascular: Normal rate and regular rhythm.  Exam reveals no gallop and no friction rub.   No murmur heard. Pulmonary/Chest: Effort normal. No respiratory distress. She has no wheezes. She has no rales.  Abdominal: Soft. She exhibits no distension.  Musculoskeletal: Normal range of motion. She exhibits no edema.       Right great toe amputation c/w reported recent surgery.   Neurological: She is alert and oriented to person, place, and time. No sensory deficit.  Skin: Skin is warm and dry.  Psychiatric: She has a normal mood and affect. Her behavior is normal.    ED Course  Procedures (including critical care time)  DIAGNOSTIC STUDIES: Oxygen Saturation is 97% on room air, normal by my interpretation.    COORDINATION OF CARE: 10:24AM-Patient informed of current plan for treatment and evaluation and agrees with plan at this time.  11:28AM- Maudry Diego, MD to patient bedside to inform patient of current labs and imaging. Patient's PCP is Dr. Currie Paris.   Labs Reviewed  BASIC METABOLIC PANEL - Abnormal; Notable for the following:    Potassium 6.4 (*)     Glucose, Bld 301 (*)     GFR calc non Af Amer 62 (*)     GFR calc Af Amer 71 (*)     All other components within normal limits  CBC - Abnormal; Notable for the following:    Hemoglobin 10.3 (*)     HCT 32.4 (*)     All other components within normal limits  DIFFERENTIAL - Abnormal; Notable for the following:    Monocytes Relative 13 (*)      All other components within normal limits   No results found.   No diagnosis found.   CRITICAL CARE Performed by: Zeffie Bickert L   Total critical care time: 40  Critical care time was exclusive of separately billable procedures and treating other patients.  Critical care was necessary to treat or prevent imminent or life-threatening deterioration.  Critical care was time spent personally by me  on the following activities: development of treatment plan with patient and/or surrogate as well as nursing, discussions with consultants, evaluation of patient's response to treatment, examination of patient, obtaining history from patient or surrogate, ordering and performing treatments and interventions, ordering and review of laboratory studies, ordering and review of radiographic studies, pulse oximetry and re-evaluation of patient's condition.  MDM        The chart was scribed for me under my direct supervision.  I personally performed the history, physical, and medical decision making and all procedures in the evaluation of this patient.Maudry Diego, MD 11/11/11 1146

## 2011-11-11 NOTE — ED Notes (Signed)
Pt sleeping. Cardiac monitor showing NSR. Dressing applied to right great toe and right heel with 4x4 and gauze wrap.

## 2011-11-11 NOTE — ED Notes (Signed)
Pt recently had rt great toe amputation and placed on Bactrim. Pt had blood work drawn yesterday and office called today stating her Potassium 6. 7 per pt. Denies cp.

## 2011-11-11 NOTE — H&P (Signed)
PCP:   Moshe Cipro, MD   Chief Complaint:  hyperkalemia  HPI: This lady was sent to the emergency room by her outpatient physician, when her blood work revealed an elevated potassium.  Patient reports that recently she had a toe amputation done due to complications of diabetes.  She had taken 7 weeks of IV abx, and about 1 week ago, was put on oral bactrim.  The plan was to complete another 3 weeks of oral bactrim.  She had routine blood work done which showed an elevated potassium >6.  She is also on lisinopril.  She denies any other new meds.  She denies any chest pain, shortness of breath, leg swelling or any other complaints.  She is otherwise in her usual state of health.  She was evaluated in the emergency room where it was noted that she had peaked T waves on EKG, so she was admitted for further management.  Allergies:  No Known Allergies    Past Medical History  Diagnosis Date  . CHF (congestive heart failure)   . Diabetes mellitus   . Restless leg syndrome 07/25/2011  . OSA (obstructive sleep apnea) 07/25/2011    Past Surgical History  Procedure Date  . Abdominal hysterectomy   . Achilles tendon repair   . Toe amputation     partial rt great toe    Prior to Admission medications   Medication Sig Start Date End Date Taking? Authorizing Provider  aspirin EC 81 MG EC tablet Take 1 tablet (81 mg total) by mouth daily. 07/26/11 07/25/12 Yes Kathie Dike, MD  cholecalciferol (VITAMIN D) 1000 UNITS tablet Take 1,000 Units by mouth 2 (two) times daily.   Yes Historical Provider, MD  cyclobenzaprine (FLEXERIL) 5 MG tablet Take 5-10 mg by mouth 3 (three) times daily as needed. FOR MUSCLE PAIN   Yes Historical Provider, MD  ferrous sulfate 325 (65 FE) MG EC tablet Take 325 mg by mouth 3 (three) times daily with meals.   Yes Historical Provider, MD  insulin detemir (LEVEMIR) 100 UNIT/ML injection Inject 50 Units into the skin 2 (two) times daily.   Yes Historical Provider, MD    insulin lispro protamine-insulin lispro (HUMALOG 50/50) (50-50) 100 UNIT/ML SUSP Inject 50-100 Units into the skin 3 (three) times daily with meals. Sliding scale per MD   Yes Historical Provider, MD  lisinopril (PRINIVIL,ZESTRIL) 20 MG tablet Take 20 mg by mouth daily.   Yes Historical Provider, MD  metFORMIN (GLUCOPHAGE) 1000 MG tablet Take 1,000 mg by mouth 2 (two) times daily with a meal.   Yes Historical Provider, MD  metoprolol succinate (TOPROL-XL) 100 MG 24 hr tablet Take 1 tablet (100 mg total) by mouth daily. Take with or immediately following a meal. 11/08/11  Yes Lendon Colonel, NP  pregabalin (LYRICA) 25 MG capsule Take 25 mg by mouth daily.   Yes Historical Provider, MD  pregabalin (LYRICA) 75 MG capsule Take 225 mg by mouth at bedtime.   Yes Historical Provider, MD  rOPINIRole (REQUIP) 0.5 MG tablet Take 0.5 mg by mouth at bedtime.   Yes Historical Provider, MD  simvastatin (ZOCOR) 40 MG tablet Take 40 mg by mouth every evening.   Yes Historical Provider, MD  traMADol (ULTRAM) 50 MG tablet Take 50 mg by mouth 4 (four) times daily as needed. FOR PAIN   Yes Historical Provider, MD  zolpidem (AMBIEN) 10 MG tablet Take 10 mg by mouth at bedtime as needed.   Yes Historical Provider, MD  Social History:  reports that she has never smoked. She does not have any smokeless tobacco history on file. She reports that she does not drink alcohol or use illicit drugs.  History reviewed. No pertinent family history.  Review of Systems: reviewed and all negative. Constitutional: Denies fever, chills, diaphoresis, appetite change and fatigue.  HEENT: Denies photophobia, eye pain, redness, hearing loss, ear pain, congestion, sore throat, rhinorrhea, sneezing, mouth sores, trouble swallowing, neck pain, neck stiffness and tinnitus.   Respiratory: Denies SOB, DOE, cough, chest tightness,  and wheezing.   Cardiovascular: Denies chest pain, palpitations and leg swelling.  Gastrointestinal:  Denies nausea, vomiting, abdominal pain, diarrhea, constipation, blood in stool and abdominal distention.  Genitourinary: Denies dysuria, urgency, frequency, hematuria, flank pain and difficulty urinating.  Musculoskeletal: Denies myalgias, back pain, joint swelling, arthralgias and gait problem.  Skin: Denies pallor, rash and wound.  Neurological: Denies dizziness, seizures, syncope, weakness, light-headedness, numbness and headaches.  Hematological: Denies adenopathy. Easy bruising, personal or family bleeding history  Psychiatric/Behavioral: Denies suicidal ideation, mood changes, confusion, nervousness, sleep disturbance and agitation   Physical Exam: Blood pressure 153/74, pulse 86, temperature 97.5 F (36.4 C), temperature source Oral, resp. rate 22, height 5\' 10"  (1.778 m), weight 135.3 kg (298 lb 4.5 oz), last menstrual period 07/25/2011, SpO2 97.00%. General:  Obese lady sitting in bed, NAD, AAOx3,  HEENT: [place], AT, PERRLA Neck: supple Chest: CTA B Cardiac: S1, S2, RRR Abd: obese, soft, NT, BS+ Ext: trace edema b/l, right great toe has dressing in place. Neuro: grossly intact, non focal   Labs on Admission:  Results for orders placed during the hospital encounter of 11/11/11 (from the past 48 hour(s))  BASIC METABOLIC PANEL     Status: Abnormal   Collection Time   11/11/11 10:32 AM      Component Value Range Comment   Sodium 135  135 - 145 mEq/L    Potassium 6.4 (*) 3.5 - 5.1 mEq/L    Chloride 104  96 - 112 mEq/L    CO2 24  19 - 32 mEq/L    Glucose, Bld 301 (*) 70 - 99 mg/dL    BUN 18  6 - 23 mg/dL    Creatinine, Ser 0.95  0.50 - 1.10 mg/dL    Calcium 8.8  8.4 - 10.5 mg/dL    GFR calc non Af Amer 62 (*) >90 mL/min    GFR calc Af Amer 71 (*) >90 mL/min   CBC     Status: Abnormal   Collection Time   11/11/11 10:32 AM      Component Value Range Comment   WBC 6.0  4.0 - 10.5 K/uL    RBC 3.89  3.87 - 5.11 MIL/uL    Hemoglobin 10.3 (*) 12.0 - 15.0 g/dL    HCT 32.4 (*) 36.0  - 46.0 %    MCV 83.3  78.0 - 100.0 fL    MCH 26.5  26.0 - 34.0 pg    MCHC 31.8  30.0 - 36.0 g/dL    RDW 15.5  11.5 - 15.5 %    Platelets 255  150 - 400 K/uL   DIFFERENTIAL     Status: Abnormal   Collection Time   11/11/11 10:32 AM      Component Value Range Comment   Neutrophils Relative 58  43 - 77 %    Neutro Abs 3.5  1.7 - 7.7 K/uL    Lymphocytes Relative 23  12 - 46 %    Lymphs  Abs 1.4  0.7 - 4.0 K/uL    Monocytes Relative 13 (*) 3 - 12 %    Monocytes Absolute 0.8  0.1 - 1.0 K/uL    Eosinophils Relative 5  0 - 5 %    Eosinophils Absolute 0.3  0.0 - 0.7 K/uL    Basophils Relative 1  0 - 1 %    Basophils Absolute 0.0  0.0 - 0.1 K/uL   GLUCOSE, CAPILLARY     Status: Abnormal   Collection Time   11/11/11  5:08 PM      Component Value Range Comment   Glucose-Capillary 209 (*) 70 - 99 mg/dL     Radiological Exams on Admission: No results found.  Assessment/Plan Principal Problem:  *Hyperkalemia Active Problems:  Diabetes mellitus  OSA (obstructive sleep apnea)  Hyperlipidemia  Hypertension  Morbid obesity  Plan:  1. Hyperkalemia.  Patient has received appropriate treatment in ED with calcium, bicarbonate, insulin and glucose.  We have started her on kayexalate. We will repeat a potassium later tonight, and then again in the morning.  The likely cause of her hyperkalemia is bactrim.  Her ACE will also be held.  We will repeat an EKG in the morning and monitor on telemetry overnight.  If potassium is improved by morning, she can likely be discharged.  2. Recent toe amputation.  We will change bactrim to doxycycline.  3. DM, will continue on sliding scale insulin, and home dose of levemir  4. Morbid obesity, counseled on weight loss, diet and exercise  5.  Dispo.  Possible discharge in am.  Time Spent on Admission: 96mins  Sholanda Croson [facility] Pager: [phone] 11/11/2011, 5:11 PM

## 2011-11-12 DIAGNOSIS — E119 Type 2 diabetes mellitus without complications: Secondary | ICD-10-CM

## 2011-11-12 DIAGNOSIS — E875 Hyperkalemia: Secondary | ICD-10-CM

## 2011-11-12 LAB — BASIC METABOLIC PANEL
CO2: 26 mEq/L (ref 19–32)
Calcium: 8.9 mg/dL (ref 8.4–10.5)
Chloride: 105 mEq/L (ref 96–112)
Creatinine, Ser: 0.86 mg/dL (ref 0.50–1.10)
GFR calc Af Amer: 80 mL/min — ABNORMAL LOW (ref >90)
Sodium: 140 mEq/L (ref 135–145)

## 2011-11-12 MED ORDER — INSULIN DETEMIR 100 UNIT/ML ~~LOC~~ SOLN
SUBCUTANEOUS | Status: AC
  Filled 2011-11-12: qty 10

## 2011-11-12 MED ORDER — DOXYCYCLINE HYCLATE 100 MG PO TABS: 100.0000 mg | ORAL_TABLET | Freq: Two times a day (BID) | ORAL | Status: AC

## 2011-11-12 MED ORDER — LISINOPRIL 20 MG PO TABS: ORAL_TABLET | ORAL | Status: DC

## 2011-11-12 NOTE — Progress Notes (Signed)
Pt had 15 beat run of V-tach. Asymptomatic. Dr Megan Salon notified. Pt admitted with Hyperkalemia. Last potassium level was normal at 5.1 after Kayexolate.

## 2011-11-12 NOTE — Progress Notes (Signed)
Inpatient Diabetes Program Recommendations  AACE/ADA: New Consensus Statement on Inpatient Glycemic Control (2009)  Target Ranges:  Prepandial:   less than 140 mg/dL      Peak postprandial:   less than 180 mg/dL (1-2 hours)      Critically ill patients:  140 - 180 mg/dL   Reason for Visit: Home regimen includes meal coverage of Humalog 50/50, 50 to 100 units tidwc  Inpatient Diabetes Program Recommendations Insulin - Meal Coverage: Most probably will need at least a start of 6 units tidwc  Note: Thank you, Rosita Kea, RN, CNS, Diabetes Coordinator [phone])

## 2011-11-12 NOTE — Discharge Summary (Signed)
Physician Discharge Summary  Patient ID: Mary [name] MRN: [id number] DOB/AGE: 12/16/1945 66 y.o. Primary Care Physician:CAPLAN,MICHAEL, MD Admit date: 11/11/2011 Discharge date: 11/12/2011    Discharge Diagnoses:  1. Hyperkalemia secondary to a combination of Bactrim use compounded by ACE inhibitor. Bactrim was being used for status post toe amputation. 2. Type 2 diabetes mellitus. 3. Morbid obesity. 4. Hypertension. 5. Obstructive sleep apnea. Principal Problem:  *Hyperkalemia Active Problems:  Diabetes mellitus  OSA (obstructive sleep apnea)  Hyperlipidemia  Hypertension  Morbid obesity   Medication List  As of 11/12/2011 12:17 PM   TAKE these medications         aspirin 81 MG EC tablet   Take 1 tablet (81 mg total) by mouth daily.      cholecalciferol 1000 UNITS tablet   Commonly known as: VITAMIN D   Take 1,000 Units by mouth 2 (two) times daily.      cyclobenzaprine 5 MG tablet   Commonly known as: FLEXERIL   Take 5-10 mg by mouth 3 (three) times daily as needed. FOR MUSCLE PAIN      doxycycline 100 MG tablet   Commonly known as: VIBRA-TABS   Take 1 tablet (100 mg total) by mouth every 12 (twelve) hours.      ferrous sulfate 325 (65 FE) MG EC tablet   Take 325 mg by mouth 3 (three) times daily with meals.      insulin detemir 100 UNIT/ML injection   Commonly known as: LEVEMIR   Inject 50 Units into the skin 2 (two) times daily.      insulin lispro protamine-insulin lispro (50-50) 100 UNIT/ML Susp   Commonly known as: HUMALOG 50/50   Inject 50-100 Units into the skin 3 (three) times daily with meals. Sliding scale per MD      lisinopril 20 MG tablet   Commonly known as: PRINIVIL,ZESTRIL   Don't take this medicine until you have seen your physician on Tuesday.      metFORMIN 1000 MG tablet   Commonly known as: GLUCOPHAGE   Take 1,000 mg by mouth 2 (two) times daily with a meal.      metoprolol succinate 100 MG 24 hr tablet   Commonly known as:  TOPROL-XL   Take 1 tablet (100 mg total) by mouth daily. Take with or immediately following a meal.      pregabalin 75 MG capsule   Commonly known as: LYRICA   Take 225 mg by mouth at bedtime.      pregabalin 25 MG capsule   Commonly known as: LYRICA   Take 25 mg by mouth daily.      rOPINIRole 0.5 MG tablet   Commonly known as: REQUIP   Take 0.5 mg by mouth at bedtime.      simvastatin 40 MG tablet   Commonly known as: ZOCOR   Take 40 mg by mouth every evening.      traMADol 50 MG tablet   Commonly known as: ULTRAM   Take 50 mg by mouth 4 (four) times daily as needed. FOR PAIN      zolpidem 10 MG tablet   Commonly known as: AMBIEN   Take 10 mg by mouth at bedtime as needed.            Discharged Condition: Stable and improved.    Consults: none.  Significant Diagnostic Studies: No results found.  Lab Results: Basic Metabolic Panel:  Basename 11/12/11 0543 11/11/11 2150 11/11/11 1032  NA 140 -- 135  K  4.5 5.1 --  CL 105 -- 104  CO2 26 -- 24  GLUCOSE 173* -- 301*  BUN 14 -- 18  CREATININE 0.86 -- 0.95  CALCIUM 8.9 -- 8.8  MG -- -- --  PHOS -- -- --       CBC:  Basename 11/11/11 1032  WBC 6.0  NEUTROABS 3.5  HGB 10.3*  HCT 32.4*  MCV 83.3  PLT 255       Hospital Course: This very pleasant 66 year old lady was admitted with hyperkalemia. She was sent by her primary care physician, blood work revealed an elevated potassium of greater than 6. Indeed on arrival to the emergency room her potassium was 6.4. She had been recently started on oral Bactrim. She also is on ACE inhibitor, lisinopril. She was not clinically or biochemically dehydrated. Her creatinine was normal at 0.95. Therefore, she was admitted, intravenous fluids, calcium, bicarbonate, insulin and glucose were given. She was also started on Kayexalate. Her potassium is now normalized. Her renal function remains normal. Doxycycline has been started in place of Bactrim. She feels well. She  wants to go home.  Discharge Exam: Blood pressure 146/79, pulse 89, temperature 98.3 F (36.8 C), temperature source Oral, resp. rate 20, height 5\' 10"  (1.778 m), weight 135.3 kg (298 lb 4.5 oz), last menstrual period 07/25/2011, SpO2 94.00%. She looks systemically well. She is morbidly obese. Heart sounds are present nontender lung fields are clear. She is alert and orientated.  Disposition: Home. For the time being, her lisinopril has been held. She has an appointment with her primary care physician in the next 3-4 days. I think it would be  safe to restart her lisinopril at this time but her potassium must be monitored closely.  Discharge Orders    Future Orders Please Complete By Expires   Diet - low sodium heart healthy      Increase activity slowly           SignedDoree Albee Pager [phone]  11/12/2011, 12:17 PM

## 2011-11-12 NOTE — Discharge Summary (Signed)
Pt dc,d to home with dc paper instructions

## 2011-11-22 ENCOUNTER — Other Ambulatory Visit: Payer: Self-pay | Admitting: Internal Medicine

## 2011-11-24 NOTE — Progress Notes (Signed)
UR Chart Review Completed  

## 2012-01-15 ENCOUNTER — Emergency Department (HOSPITAL_COMMUNITY)
Admission: EM | Admit: 2012-01-15 | Discharge: 2012-01-15 | Disposition: A | Discharge status: Home / Self Care | Payer: Medicare Other | Attending: Emergency Medicine | Admitting: Emergency Medicine

## 2012-01-15 ENCOUNTER — Encounter (HOSPITAL_COMMUNITY): Payer: Self-pay | Admitting: *Deleted

## 2012-01-15 ENCOUNTER — Emergency Department (HOSPITAL_COMMUNITY): Payer: Medicare Other

## 2012-01-15 DIAGNOSIS — E119 Type 2 diabetes mellitus without complications: Secondary | ICD-10-CM | POA: Insufficient documentation

## 2012-01-15 DIAGNOSIS — I509 Heart failure, unspecified: Secondary | ICD-10-CM | POA: Insufficient documentation

## 2012-01-15 DIAGNOSIS — T50905A Adverse effect of unspecified drugs, medicaments and biological substances, initial encounter: Secondary | ICD-10-CM

## 2012-01-15 DIAGNOSIS — R0600 Dyspnea, unspecified: Secondary | ICD-10-CM

## 2012-01-15 DIAGNOSIS — Z794 Long term (current) use of insulin: Secondary | ICD-10-CM | POA: Insufficient documentation

## 2012-01-15 DIAGNOSIS — R0989 Other specified symptoms and signs involving the circulatory and respiratory systems: Secondary | ICD-10-CM | POA: Insufficient documentation

## 2012-01-15 DIAGNOSIS — G4733 Obstructive sleep apnea (adult) (pediatric): Secondary | ICD-10-CM | POA: Insufficient documentation

## 2012-01-15 DIAGNOSIS — T428X5A Adverse effect of antiparkinsonism drugs and other central muscle-tone depressants, initial encounter: Secondary | ICD-10-CM | POA: Insufficient documentation

## 2012-01-15 DIAGNOSIS — G2581 Restless legs syndrome: Secondary | ICD-10-CM | POA: Insufficient documentation

## 2012-01-15 DIAGNOSIS — Z7982 Long term (current) use of aspirin: Secondary | ICD-10-CM | POA: Insufficient documentation

## 2012-01-15 DIAGNOSIS — R0609 Other forms of dyspnea: Secondary | ICD-10-CM | POA: Insufficient documentation

## 2012-01-15 DIAGNOSIS — T398X5A Adverse effect of other nonopioid analgesics and antipyretics, not elsewhere classified, initial encounter: Secondary | ICD-10-CM | POA: Insufficient documentation

## 2012-01-15 DIAGNOSIS — Z79899 Other long term (current) drug therapy: Secondary | ICD-10-CM | POA: Insufficient documentation

## 2012-01-15 LAB — CBC WITH DIFFERENTIAL/PLATELET
Eosinophils Absolute: 0.3 10*3/uL (ref 0.0–0.7)
Hemoglobin: 9.9 g/dL — ABNORMAL LOW (ref 12.0–15.0)
Lymphs Abs: 1.8 10*3/uL (ref 0.7–4.0)
Monocytes Relative: 9 % (ref 3–12)
Neutro Abs: 6.5 10*3/uL (ref 1.7–7.7)
Neutrophils Relative %: 68 % (ref 43–77)
Platelets: 276 10*3/uL (ref 150–400)
RBC: 3.76 MIL/uL — ABNORMAL LOW (ref 3.87–5.11)
WBC: 9.4 10*3/uL (ref 4.0–10.5)

## 2012-01-15 LAB — PRO B NATRIURETIC PEPTIDE: Pro B Natriuretic peptide (BNP): 454.6 pg/mL — ABNORMAL HIGH (ref 0–125)

## 2012-01-15 LAB — COMPREHENSIVE METABOLIC PANEL
ALT: 15 U/L (ref 0–35)
Albumin: 2.8 g/dL — ABNORMAL LOW (ref 3.5–5.2)
Alkaline Phosphatase: 93 U/L (ref 39–117)
BUN: 24 mg/dL — ABNORMAL HIGH (ref 6–23)
Chloride: 101 mEq/L (ref 96–112)
GFR calc Af Amer: 81 mL/min — ABNORMAL LOW (ref >90)
Glucose, Bld: 96 mg/dL (ref 70–99)
Potassium: 4.4 mEq/L (ref 3.5–5.1)
Sodium: 135 mEq/L (ref 135–145)
Total Bilirubin: 0.2 mg/dL — ABNORMAL LOW (ref 0.3–1.2)
Total Protein: 6.8 g/dL (ref 6.0–8.3)

## 2012-01-15 NOTE — ED Provider Notes (Signed)
History     CSN: [id number]  Arrival date & time 01/15/12  1536   First MD Initiated Contact with Patient 01/15/12 1559      Chief Complaint  Patient presents with  . Shortness of Breath    (Consider location/radiation/quality/duration/timing/severity/associated sxs/prior treatment) HPI Comments: Patient states took two ultram and 2 mirapex tablets as her restless legs were acting up.  Shortly afterward, she reports feeling short of breath and dizzy.  She denies chest pain or cough.  No fevers or chills.  She has a history of CHF, IDDM.  Patient is a 66 y.o. female presenting with shortness of breath. The history is provided by the patient.  Shortness of Breath  The current episode started today. The onset was sudden. The problem occurs continuously. The problem has been gradually worsening. The problem is moderate. Nothing relieves the symptoms. Nothing aggravates the symptoms. Associated symptoms include shortness of breath. Pertinent negatives include no chest pain and no cough.    Past Medical History  Diagnosis Date  . CHF (congestive heart failure)   . Diabetes mellitus   . Restless leg syndrome 07/25/2011  . OSA (obstructive sleep apnea) 07/25/2011    Past Surgical History  Procedure Date  . Abdominal hysterectomy   . Achilles tendon repair   . Toe amputation     partial rt great toe    History reviewed. No pertinent family history.  History  Substance Use Topics  . Smoking status: Never Smoker   . Smokeless tobacco: Not on file  . Alcohol Use: No    OB History    Grav Para Term Preterm Abortions TAB SAB Ect Mult Living                  Review of Systems  Respiratory: Positive for shortness of breath. Negative for cough and chest tightness.   Cardiovascular: Negative for chest pain and palpitations.  Gastrointestinal: Negative for abdominal pain.  Musculoskeletal:       No swelling  All other systems reviewed and are negative.    Allergies  Review  of patient's allergies indicates no known allergies.  Home Medications   Current Outpatient Rx  Name Route Sig Dispense Refill  . ASPIRIN 81 MG PO TBEC Oral Take 1 tablet (81 mg total) by mouth daily. 30 tablet 1  . VITAMIN D 1000 UNITS PO TABS Oral Take 1,000 Units by mouth 2 (two) times daily.    . CYCLOBENZAPRINE HCL 5 MG PO TABS Oral Take 5-10 mg by mouth 3 (three) times daily as needed. FOR MUSCLE PAIN    . FERROUS SULFATE 325 (65 FE) MG PO TBEC Oral Take 325 mg by mouth 3 (three) times daily with meals.    . INSULIN DETEMIR 100 UNIT/ML [place] SOLN Subcutaneous Inject 50 Units into the skin 2 (two) times daily.    . INSULIN LISPRO PROT & LISPRO (50-50) 100 UNIT/ML  SUSP Subcutaneous Inject 50-100 Units into the skin 3 (three) times daily with meals. Sliding scale per MD    . LISINOPRIL 20 MG PO TABS  Don't take this medicine until you have seen your physician on Tuesday.    Marland Kitchen METFORMIN HCL 1000 MG PO TABS Oral Take 1,000 mg by mouth 2 (two) times daily with a meal.    . METOPROLOL SUCCINATE ER 100 MG PO TB24 Oral Take 1 tablet (100 mg total) by mouth daily. Take with or immediately following a meal. 30 tablet 6  . PREGABALIN 25 MG PO  CAPS Oral Take 25 mg by mouth daily.    Marland Kitchen PREGABALIN 75 MG PO CAPS Oral Take 225 mg by mouth at bedtime.    Marland Kitchen ROPINIROLE HCL 0.5 MG PO TABS Oral Take 0.5 mg by mouth at bedtime.    Marland Kitchen SIMVASTATIN 40 MG PO TABS Oral Take 40 mg by mouth every evening.    Marland Kitchen TRAMADOL HCL 50 MG PO TABS Oral Take 50 mg by mouth 4 (four) times daily as needed. FOR PAIN    . ZOLPIDEM TARTRATE 10 MG PO TABS Oral Take 10 mg by mouth at bedtime as needed.      BP 111/52  Pulse 89  Temp 98.1 F (36.7 C) (Oral)  Resp 26  Ht 5\' 10"  (1.778 m)  Wt 290 lb (131.543 kg)  BMI 41.61 kg/m2  SpO2 96%  LMP 07/25/2011  Physical Exam  Nursing note and vitals reviewed. Constitutional: She is oriented to person, place, and time. She appears well-developed and well-nourished. No distress.    HENT:  Head: Normocephalic and atraumatic.  Neck: Normal range of motion. Neck supple.  Cardiovascular: Normal rate and regular rhythm.   No murmur heard. Pulmonary/Chest: Effort normal and breath sounds normal. No respiratory distress. She has no wheezes.  Abdominal: Soft. She exhibits no distension. There is no tenderness.       Abdomen is obese.  Musculoskeletal: Normal range of motion.       The right lower extremity has a post-op shoe and ace bandage in place.  There is trace edema of the ble.    Neurological: She is alert and oriented to person, place, and time.  Skin: Skin is warm and dry. She is not diaphoretic.    ED Course  Procedures (including critical care time)   Labs Reviewed  CBC WITH DIFFERENTIAL  COMPREHENSIVE METABOLIC PANEL  PRO B NATRIURETIC PEPTIDE  TROPONIN I   No results found.   No diagnosis found.   Date: 01/15/2012  Rate: 82  Rhythm: normal sinus rhythm  QRS Axis: normal  Intervals: normal  ST/T Wave abnormalities: normal  Conduction Disutrbances:none  Narrative Interpretation:   Old EKG Reviewed: unchanged    MDM  The patient presents with dizziness and shortness of breath after taking double doses of two medications for her restless legs.  The vitals and physical exam are unremarkable.  The ekg is unchanged and the chest xray is non-acute.  She has been observed and is feeling much better.  I believe her symptoms are related to these medications and not an exacerbation of her chf.  She will be discharged to home, with return prn if she worsens.        Veryl Speak, MD 01/15/12 [phone]

## 2012-01-15 NOTE — ED Notes (Signed)
Pt states SOB has subsided. NAD

## 2012-01-15 NOTE — ED Notes (Signed)
Pt states that she has restless leg syndrome. States that she took 2 Ultram for the pain and then took 2 old Mirapex 30 minutes later. States that they are out of date. Pt states that the meds have not helped and that she feels some shortness of breath since taking the medications.

## 2012-01-24 ENCOUNTER — Emergency Department (HOSPITAL_COMMUNITY): Payer: Medicare Other

## 2012-01-24 ENCOUNTER — Encounter (HOSPITAL_COMMUNITY): Payer: Self-pay

## 2012-01-24 ENCOUNTER — Emergency Department (HOSPITAL_COMMUNITY)
Admission: EM | Admit: 2012-01-24 | Discharge: 2012-01-24 | Disposition: A | Discharge status: Home / Self Care | Payer: Medicare Other | Attending: Emergency Medicine | Admitting: Emergency Medicine

## 2012-01-24 ENCOUNTER — Emergency Department (HOSPITAL_COMMUNITY)
Admit: 2012-01-24 | Discharge: 2012-01-24 | Disposition: A | Discharge status: Home / Self Care | Payer: Medicare Other | Attending: Emergency Medicine | Admitting: Emergency Medicine

## 2012-01-24 DIAGNOSIS — Z794 Long term (current) use of insulin: Secondary | ICD-10-CM | POA: Insufficient documentation

## 2012-01-24 DIAGNOSIS — H539 Unspecified visual disturbance: Secondary | ICD-10-CM | POA: Insufficient documentation

## 2012-01-24 DIAGNOSIS — H5710 Ocular pain, unspecified eye: Secondary | ICD-10-CM | POA: Insufficient documentation

## 2012-01-24 DIAGNOSIS — S0010XA Contusion of unspecified eyelid and periocular area, initial encounter: Secondary | ICD-10-CM | POA: Insufficient documentation

## 2012-01-24 DIAGNOSIS — E119 Type 2 diabetes mellitus without complications: Secondary | ICD-10-CM | POA: Insufficient documentation

## 2012-01-24 DIAGNOSIS — H1130 Conjunctival hemorrhage, unspecified eye: Secondary | ICD-10-CM | POA: Insufficient documentation

## 2012-01-24 DIAGNOSIS — S0011XA Contusion of right eyelid and periocular area, initial encounter: Secondary | ICD-10-CM

## 2012-01-24 DIAGNOSIS — W1809XA Striking against other object with subsequent fall, initial encounter: Secondary | ICD-10-CM | POA: Insufficient documentation

## 2012-01-24 MED ORDER — TETRACAINE HCL 0.5 % OP SOLN
OPHTHALMIC | Status: AC
  Filled 2012-01-24: qty 2

## 2012-01-24 MED ORDER — FLUORESCEIN SODIUM 1 MG OP STRP
ORAL_STRIP | OPHTHALMIC | Status: AC
  Filled 2012-01-24: qty 1

## 2012-01-24 MED ORDER — HYDROCODONE-ACETAMINOPHEN 5-325 MG PO TABS
2.0000 | ORAL_TABLET | Freq: Once | ORAL | Status: DC
  Filled 2012-01-24: qty 2

## 2012-01-24 NOTE — ED Notes (Signed)
Patient is resting comfortably. Family at bedside.  

## 2012-01-24 NOTE — ED Notes (Signed)
Pt fell while getting out of the bed this am, hit her right eye on nightstand, has swelling to lids and blood in eye

## 2012-01-24 NOTE — ED Notes (Addendum)
Pt was scheduled to have dressing on r heel redressed this morning at 9.  Pt's husband requested we dress wound here.  Called wound center and was given instructions for dressing.  Danton Sewer, RN at wound center, says pt had iodaform packed in wound last week that needs to be removed.  When undressed wound, was unable to find packing.  Irrigated wound. Applied wet to dry dressing and notified Danton Sewer RN.  Was told to instruct huband to bring pt to wound center today when discharged, if pt discharged.  Dr. Tomi Bamberger aware.

## 2012-01-24 NOTE — ED Notes (Signed)
Danton Sewer RN from wound center requested an x ray be done on pt's foot to see if gauze is visible.  Asked Dr. Tomi Bamberger, and was told she would do the x ray as an outpt and pt could leave after x ray was done.  PT instructed to go directly to wound center after released from x ray.

## 2012-01-24 NOTE — ED Provider Notes (Signed)
History  This chart was scribed for Mary Norrie, MD by Roe Coombs. The patient was seen in room APA03/APA03. Patient's care was started at 0625.     CSN: [id number]  Arrival date & time 01/24/12  [id number]   First MD Initiated Contact with Patient 01/24/12 [phone]      Chief Complaint  Patient presents with  . Fall  . Eye Injury    (Consider location/radiation/quality/duration/timing/severity/associated sxs/prior treatment) Patient is a 66 y.o. female presenting with fall. The history is provided by the patient and a relative. No language interpreter was used.  Fall The accident occurred 1 to 2 hours ago. The fall occurred from a bed. She fell from a height of 1 to 2 ft. There was no blood loss. The point of impact was the head. The pain is present in the head. She was ambulatory at the scene. There was no entrapment after the fall. There was no drug use involved in the accident. There was no alcohol use involved in the accident. Pertinent negatives include no visual change. She has tried nothing for the symptoms. The treatment provided no relief.   Mary [name] is a 66 y.o. female with a history of diabetes who presents to the Emergency Department complaining of a fall that occurred 1-2 hours ago. Patient says that she fell off the bed while sitting on it and hit her right eye on the nightstand while getting out of bed. There is severe pain, swelling and redness to her eye. Pt states "I think I knocked my eyeball out" and states she has a lot of pain in it. Patient denies LOC.   Pt has her first appointment with Dr Gershon Crane, Ophthalmologist tomorrow.   Patient is ambulatory, but uses a cane. She has a history of foot ulcers and extremity complications resulting from diabetes. Her medical history includes congestive heart failure, restless leg syndrome and obstructive sleep apnea. She has a surgical history of abdominal hysterectomy, achilles tendon repair and toe amputation.  PCP - Meade Maw, MD Diabetes wound management [facility]  Past Medical History  Diagnosis Date  . CHF (congestive heart failure)   . Diabetes mellitus   . Restless leg syndrome 07/25/2011  . OSA (obstructive sleep apnea) 07/25/2011    Past Surgical History  Procedure Date  . Abdominal hysterectomy   . Achilles tendon repair   . Toe amputation     partial rt great toe    No family history on file.  History  Substance Use Topics  . Smoking status: Never Smoker   . Smokeless tobacco: Not on file  . Alcohol Use: No  Lives with spouse Lives at home Uses a cane  OB History    Grav Para Term Preterm Abortions TAB SAB Ect Mult Living                  Review of Systems  Eyes: Positive for pain, redness and visual disturbance.  Neurological: Negative for syncope.  All other systems reviewed and are negative.    Allergies  Review of patient's allergies indicates no known allergies.  Home Medications   Current Outpatient Rx  Name Route Sig Dispense Refill  . ASPIRIN 81 MG PO TBEC Oral Take 1 tablet (81 mg total) by mouth daily. 30 tablet 1  . VITAMIN D 1000 UNITS PO TABS Oral Take 1,000 Units by mouth 2 (two) times daily.    Marland Kitchen CILOSTAZOL 100 MG PO TABS Oral Take 100 mg by mouth daily.    Marland Kitchen  CYCLOBENZAPRINE HCL 5 MG PO TABS Oral Take 5 mg by mouth 2 (two) times daily as needed. FOR MUSCLE PAIN    . FERROUS SULFATE 325 (65 FE) MG PO TBEC Oral Take 325 mg by mouth daily.     . INSULIN DETEMIR 100 UNIT/ML [place] SOLN Subcutaneous Inject 50 Units into the skin 2 (two) times daily.    . INSULIN LISPRO PROT & LISPRO (50-50) 100 UNIT/ML [place] SUSP Subcutaneous Inject 50-100 Units into the skin 3 (three) times daily with meals. Sliding scale per MD    . LISINOPRIL 20 MG PO TABS  Don't take this medicine until you have seen your physician on Tuesday.    Marland Kitchen METFORMIN HCL 1000 MG PO TABS Oral Take 1,000 mg by mouth 2 (two) times daily with a meal.    . METOPROLOL SUCCINATE ER 100 MG PO TB24 Oral Take 1  tablet (100 mg total) by mouth daily. Take with or immediately following a meal. 30 tablet 6  . PREGABALIN 75 MG PO CAPS Oral Take 225 mg by mouth at bedtime.    Marland Kitchen ROPINIROLE HCL 0.5 MG PO TABS Oral Take 0.5 mg by mouth at bedtime.    Marland Kitchen SIMVASTATIN 40 MG PO TABS Oral Take 40 mg by mouth every evening.    Marland Kitchen TRAMADOL HCL 50 MG PO TABS Oral Take 50 mg by mouth 4 (four) times daily as needed. FOR PAIN      BP 135/55  Pulse 92  Temp 98.4 F (36.9 C) (Oral)  Resp 16  Ht 5\' 10"  (1.778 m)  Wt 284 lb (128.822 kg)  BMI 40.75 kg/m2  SpO2 94%  LMP 07/25/2011  Vital signs normal    Physical Exam  Nursing note and vitals reviewed. Constitutional: She is oriented to person, place, and time. She appears well-developed and well-nourished. No distress.  HENT:  Head: Normocephalic.  Right Ear: External ear normal.  Left Ear: External ear normal.  Nose: Nose normal.  Mouth/Throat: Oropharynx is clear and moist.  Eyes: EOM are normal. Pupils are equal, round, and reactive to light. Right conjunctiva has a hemorrhage. Left conjunctiva has no hemorrhage.       bruising and swelling of upper and lower right eyelid. Subconjunctival hemorrhage of the right  medially and inferiorly. Right eyeball seems to be protruding slightly. Pt states she can see me when I open her eyelids, states her vision is blurred but she normally wears glasses. Patient can read print small print. No scratches visible on eye with fluorescein stain.  Neck: Normal range of motion. Neck supple.  Cardiovascular: Normal rate and regular rhythm.   No murmur heard. Pulmonary/Chest: Effort normal and breath sounds normal. No respiratory distress.  Musculoskeletal:       Partial amputation of distal right great toe. Foot is wrapped with Ace wrap.  Neurological: She is alert and oriented to person, place, and time.  Psychiatric: She has a normal mood and affect. Her behavior is normal.    ED Course  Procedures (including critical care  time)   Medications  HYDROcodone-acetaminophen (NORCO/VICODIN) 5-325 MG per tablet 2 tablet (2 tablet Oral Not Given 01/24/12 0729)  fluorescein 1 MG ophthalmic strip (not administered)  tetracaine (PONTOCAINE) 0.5 % ophthalmic solution (not administered)    Pain meds were refused by husband, states she is too sleepy (pt snoring every time I enter the room).   DIAGNOSTIC STUDIES: Oxygen Saturation is 94% on room air, adequate by my interpretation.    COORDINATION OF  CARE: 7:09am- Patient informed of current plan for treatment and evaluation and agrees with plan at this time.  VA attempted by nurses. VA 20/200 OS, couldn't see eye chart with Right eye  Nurse changed foot dressing, however did not find the packing that was placed at the wound center. Was advised by them to replace with wet to dry dressing and have come by today to have them recheck her wound.   9:56am- Can read small print on card if I hold her eyelids open on the right. Administered fluorescein with tetracaine to examine eye for scratches. No corneal abrasion seen.  Husband states they just found out their appointment with Dr Gershon Crane was cancelled because he is going to be out of the office all week. They are agreeable to see Dr Iona Hansen in [place].  10:04 Dr Iona Hansen office, he is in the [place] all day today at [facility], gave appointment tomorrow at 8:45  Results for orders placed during the hospital encounter of 01/24/12  GLUCOSE, CAPILLARY      Component Value Range   Glucose-Capillary 278 (*) 70 - 99 mg/dL   Laboratory interpretation all normal except hyperglycemia  Ct Orbitss W/o Cm  01/24/2012  *RADIOLOGY REPORT*  Clinical Data: Right orbital pain and swelling status post fall.  CT ORBITS WITHOUT CONTRAST  Technique:  Multidetector CT imaging of the orbits was performed following the standard protocol without intravenous contrast.  Comparison: None.  Findings: There is moderate preorbital soft tissue swelling on the right.   No post septal hematoma is identified.  There is no evidence of globe injury or lens displacement.  The optic nerves and extraocular muscles appear normal.  There is no evidence of orbital or other upper facial fracture. There are no paranasal sinus air-fluid levels.  There is mucosal thickening inferiorly in the right maxillary sinus.  There is apparent periodontal disease involving the left maxillary canine (tooth 11), incompletely imaged.  Some of the right maxillary teeth are absent.  IMPRESSION:  1.  Moderate preseptal soft tissue swelling in the right orbit.  No evidence of post septal hematoma or globe injury. 2.  No evidence of orbital fracture. 3.  Right maxillary sinus mucosal thickening and suspected periodontal disease.   Original Report Authenticated By: Vivia Ewing, M.D.       1. Contusion of right eyelid   2. Subconjunctival hemorrhage, traumatic    Plan discharge  Rolland Porter, MD, FACEP    MDM   I personally performed the services described in this documentation, which was scribed in my presence. The recorded information has been reviewed and considered.  Rolland Porter, MD, FACEP   Mary Norrie, MD 01/24/12 1013

## 2012-02-06 ENCOUNTER — Encounter (HOSPITAL_COMMUNITY): Payer: Self-pay | Admitting: *Deleted

## 2012-02-06 ENCOUNTER — Emergency Department (HOSPITAL_COMMUNITY): Payer: Medicare Other

## 2012-02-06 ENCOUNTER — Emergency Department (HOSPITAL_COMMUNITY)
Admission: EM | Admit: 2012-02-06 | Discharge: 2012-02-06 | Disposition: A | Discharge status: Home / Self Care | Payer: Medicare Other | Attending: Emergency Medicine | Admitting: Emergency Medicine

## 2012-02-06 DIAGNOSIS — E119 Type 2 diabetes mellitus without complications: Secondary | ICD-10-CM | POA: Insufficient documentation

## 2012-02-06 DIAGNOSIS — Z794 Long term (current) use of insulin: Secondary | ICD-10-CM | POA: Insufficient documentation

## 2012-02-06 DIAGNOSIS — Z4800 Encounter for change or removal of nonsurgical wound dressing: Secondary | ICD-10-CM | POA: Insufficient documentation

## 2012-02-06 DIAGNOSIS — R1032 Left lower quadrant pain: Secondary | ICD-10-CM | POA: Insufficient documentation

## 2012-02-06 DIAGNOSIS — R109 Unspecified abdominal pain: Secondary | ICD-10-CM

## 2012-02-06 DIAGNOSIS — Z90710 Acquired absence of both cervix and uterus: Secondary | ICD-10-CM | POA: Insufficient documentation

## 2012-02-06 HISTORY — DX: Other injury of unspecified body region, initial encounter: T14.8XXA

## 2012-02-06 LAB — CBC WITH DIFFERENTIAL/PLATELET
Eosinophils Absolute: 0.2 10*3/uL (ref 0.0–0.7)
Eosinophils Relative: 3 % (ref 0–5)
HCT: 30.4 % — ABNORMAL LOW (ref 36.0–46.0)
Lymphocytes Relative: 32 % (ref 12–46)
Lymphs Abs: 2.2 10*3/uL (ref 0.7–4.0)
MCH: 26.2 pg (ref 26.0–34.0)
MCV: 79.6 fL (ref 78.0–100.0)
Monocytes Absolute: 0.8 10*3/uL (ref 0.1–1.0)
Monocytes Relative: 12 % (ref 3–12)
RBC: 3.82 MIL/uL — ABNORMAL LOW (ref 3.87–5.11)
WBC: 6.9 10*3/uL (ref 4.0–10.5)

## 2012-02-06 LAB — BASIC METABOLIC PANEL
BUN: 29 mg/dL — ABNORMAL HIGH (ref 6–23)
CO2: 25 mEq/L (ref 19–32)
Calcium: 8.8 mg/dL (ref 8.4–10.5)
Creatinine, Ser: 0.94 mg/dL (ref 0.50–1.10)
GFR calc non Af Amer: 62 mL/min — ABNORMAL LOW (ref >90)
Glucose, Bld: 39 mg/dL — CL (ref 70–99)

## 2012-02-06 LAB — GLUCOSE, CAPILLARY
Glucose-Capillary: 35 mg/dL — CL (ref 70–99)
Glucose-Capillary: 48 mg/dL — ABNORMAL LOW (ref 70–99)

## 2012-02-06 LAB — URINALYSIS, ROUTINE W REFLEX MICROSCOPIC
Bilirubin Urine: NEGATIVE
Glucose, UA: NEGATIVE mg/dL
Ketones, ur: NEGATIVE mg/dL
Protein, ur: NEGATIVE mg/dL

## 2012-02-06 LAB — URINE MICROSCOPIC-ADD ON

## 2012-02-06 LAB — LACTIC ACID, PLASMA: Lactic Acid, Venous: 1.8 mmol/L (ref 0.5–2.2)

## 2012-02-06 MED ORDER — DEXTROSE 50 % IV SOLN
1.0000 | Freq: Once | INTRAVENOUS | Status: AC
  Administered 2012-02-06: 50 mL via INTRAVENOUS

## 2012-02-06 MED ORDER — ONDANSETRON 8 MG PO TBDP: 8.0000 mg | ORAL_TABLET | Freq: Three times a day (TID) | ORAL | Status: AC | PRN

## 2012-02-06 MED ORDER — ONDANSETRON HCL 4 MG/2ML IJ SOLN
4.0000 mg | Freq: Once | INTRAMUSCULAR | Status: AC
  Administered 2012-02-06: 4 mg via INTRAVENOUS
  Filled 2012-02-06: qty 2

## 2012-02-06 MED ORDER — HYDROCODONE-ACETAMINOPHEN 5-325 MG PO TABS: 1.0000 | ORAL_TABLET | ORAL | Status: AC | PRN

## 2012-02-06 MED ORDER — DEXTROSE 50 % IV SOLN
INTRAVENOUS | Status: AC
  Filled 2012-02-06: qty 50

## 2012-02-06 MED ORDER — SODIUM CHLORIDE 0.9 % IV SOLN
1000.0000 mL | Freq: Once | INTRAVENOUS | Status: AC
  Administered 2012-02-06: 1000 mL via INTRAVENOUS

## 2012-02-06 MED ORDER — HYDROMORPHONE HCL PF 1 MG/ML IJ SOLN
1.0000 mg | Freq: Once | INTRAMUSCULAR | Status: AC
  Administered 2012-02-06: 1 mg via INTRAVENOUS
  Filled 2012-02-06: qty 1

## 2012-02-06 MED ORDER — SODIUM CHLORIDE 0.9 % IV SOLN
1000.0000 mL | INTRAVENOUS | Status: DC
  Administered 2012-02-06: 1000 mL via INTRAVENOUS

## 2012-02-06 MED ORDER — IOHEXOL 300 MG/ML  SOLN
100.0000 mL | Freq: Once | INTRAMUSCULAR | Status: AC | PRN
  Administered 2012-02-06: 100 mL via INTRAVENOUS

## 2012-02-06 NOTE — ED Provider Notes (Signed)
History   This chart was scribed for Hoy Morn, MD by Lloyd Huger Rifaie. This patient was seen in room APA04/APA04 and the patient's care was started at 07:13AM.  CSN: [id number]  Arrival date & time 02/06/12  [id number]   First MD Initiated Contact with Patient 02/06/12 [phone]      Chief Complaint  Patient presents with  . Abdominal Pain  . Wound Check    The history is provided by the patient and the spouse. No language interpreter was used.    Mary [name] is a 66 y.o. female who presents to the Emergency Department complaining of 3 weeks of gradual onset, gradually worsening, constant LLQ abdominal pain described as sharp and non-radiating with associated chills. Pt denies having any modifying factors. She denies having prior episodes of similar symptoms. She states that she has been following up with her PCP with no improvement in symptoms, her last visit being 5 days ago. Pt is requesting to have her wound vac on her right foot changed. She states that she is following up with the wound center in [place] but states that she is unable to make an appointment today. Husband reports that the pt has been having increase falls due to gradually worsening weakness that he attributes to her present illness, as well as, overuse of her Tramadol and Xanax. Husband reports that pt was evaluated by EMS last night after a fall of bed and states that her CBG was measured 109. He states that her CBG over the past couple of weeks has been in the 600s . Pt denies having nausea, diarrhea, fever, and urinary symptoms. The husband states that the pt. has been drinking normally but not eating well, which is baseline for her. The pt denies having a h/o HTN. She has a h/o of CHF and DM. Pt denies smoking and alcohol use.  PCP is dr. Edwyna Ready [facility]  Past Medical History  Diagnosis Date  . CHF (congestive heart failure)   . Diabetes mellitus   . Restless leg syndrome 07/25/2011  . OSA (obstructive sleep apnea)  07/25/2011  . Wound, open     right foot    Past Surgical History  Procedure Date  . Abdominal hysterectomy   . Achilles tendon repair   . Toe amputation     partial rt great toe    No family history on file.  History  Substance Use Topics  . Smoking status: Never Smoker   . Smokeless tobacco: Not on file  . Alcohol Use: No   No OB History provided.  Review of Systems  A complete 10 system review of systems was obtained and all systems are negative except as noted in the HPI and PMH.   Allergies  Review of patient's allergies indicates no known allergies.  Home Medications   Current Outpatient Rx  Name Route Sig Dispense Refill  . ASPIRIN 81 MG PO TBEC Oral Take 1 tablet (81 mg total) by mouth daily. 30 tablet 1  . VITAMIN D 1000 UNITS PO TABS Oral Take 1,000 Units by mouth 2 (two) times daily.    Marland Kitchen CILOSTAZOL 100 MG PO TABS Oral Take 100 mg by mouth daily.    . CYCLOBENZAPRINE HCL 5 MG PO TABS Oral Take 5 mg by mouth 2 (two) times daily as needed. FOR MUSCLE PAIN    . FERROUS SULFATE 325 (65 FE) MG PO TBEC Oral Take 325 mg by mouth daily.     . INSULIN DETEMIR 100 UNIT/ML  [place] SOLN Subcutaneous Inject 50 Units into the skin 2 (two) times daily.    . INSULIN LISPRO PROT & LISPRO (50-50) 100 UNIT/ML [place] SUSP Subcutaneous Inject 50-100 Units into the skin 3 (three) times daily with meals. Sliding scale per MD    . LISINOPRIL 20 MG PO TABS  Don't take this medicine until you have seen your physician on Tuesday.    Marland Kitchen METFORMIN HCL 1000 MG PO TABS Oral Take 1,000 mg by mouth 2 (two) times daily with a meal.    . METOPROLOL SUCCINATE ER 100 MG PO TB24 Oral Take 1 tablet (100 mg total) by mouth daily. Take with or immediately following a meal. 30 tablet 6  . PREGABALIN 75 MG PO CAPS Oral Take 225 mg by mouth at bedtime.    Marland Kitchen ROPINIROLE HCL 0.5 MG PO TABS Oral Take 0.5 mg by mouth at bedtime.    Marland Kitchen SIMVASTATIN 40 MG PO TABS Oral Take 40 mg by mouth every evening.    Marland Kitchen TRAMADOL HCL  50 MG PO TABS Oral Take 50 mg by mouth 4 (four) times daily as needed. FOR PAIN      Triage Vitals: BP 98/62  Pulse 99  Temp 98.5 F (36.9 C)  Resp 20  Ht 5\' 10"  (1.778 m)  Wt 261 lb (118.389 kg)  BMI 37.45 kg/m2  SpO2 97%  LMP 07/25/2011  Physical Exam  Nursing note and vitals reviewed. Constitutional: She is oriented to person, place, and time. She appears well-developed and well-nourished. No distress.  HENT:  Head: Normocephalic and atraumatic.  Eyes: EOM are normal.  Neck: Normal range of motion.  Cardiovascular: Normal rate, regular rhythm and normal heart sounds.   Pulmonary/Chest: Effort normal and breath sounds normal.  Abdominal: Soft. She exhibits no distension. There is tenderness (LLQ). There is no rebound and no guarding.  Musculoskeletal: Normal range of motion.       Wound vac. In place on her right medial ankle.  Neurological: She is alert and oriented to person, place, and time.  Skin: Skin is warm and dry.       mild discoloration and ecchymosis on her left buttock.  Psychiatric: She has a normal mood and affect. Judgment normal.     ED Course  Procedures (including critical care time)  DIAGNOSTIC STUDIES: Oxygen Saturation is 97% on room air,adequate, by my interpretation.    COORDINATION OF CARE: 7:37AM Discussed treatment plan which includes CT abdomen with pt at bedside and pt agreed to plan.  Labs Reviewed  CBC WITH DIFFERENTIAL - Abnormal; Notable for the following:    RBC 3.82 (*)     Hemoglobin 10.0 (*)     HCT 30.4 (*)     RDW 15.8 (*)     All other components within normal limits  BASIC METABOLIC PANEL - Abnormal; Notable for the following:    Glucose, Bld 39 (*)     BUN 29 (*)     GFR calc non Af Amer 62 (*)     GFR calc Af Amer 72 (*)     All other components within normal limits  URINALYSIS, ROUTINE W REFLEX MICROSCOPIC - Abnormal; Notable for the following:    Hgb urine dipstick SMALL (*)     All other components within normal  limits  GLUCOSE, CAPILLARY - Abnormal; Notable for the following:    Glucose-Capillary 35 (*)     All other components within normal limits  GLUCOSE, CAPILLARY - Abnormal; Notable for the  following:    Glucose-Capillary 48 (*)     All other components within normal limits  GLUCOSE, CAPILLARY - Abnormal; Notable for the following:    Glucose-Capillary 163 (*)     All other components within normal limits  URINE MICROSCOPIC-ADD ON - Abnormal; Notable for the following:    Squamous Epithelial / LPF FEW (*)     Bacteria, UA FEW (*)     All other components within normal limits  LACTIC ACID, PLASMA   Ct Abdomen Pelvis W Contrast  02/06/2012  *RADIOLOGY REPORT*  Clinical Data: Left lower quadrant abdominal pain.  CT ABDOMEN AND PELVIS WITH CONTRAST  Technique:  Multidetector CT imaging of the abdomen and pelvis was performed following the standard protocol during bolus administration of intravenous contrast.  Contrast: 150mL OMNIPAQUE IOHEXOL 300 MG/ML  SOLN  Comparison: No priors.  Findings:  Lung Bases: Small hiatal hernia.  Otherwise, unremarkable.  Abdomen/Pelvis:  Contrast bolus is suboptimal, limiting sensitivity for detection of visceral vascular abnormalities.  With these limitations in mind, the appearance of the liver, gallbladder, pancreas, spleen, bilateral adrenal glands and bilateral kidneys is unremarkable.  Status post total abdominal hysterectomy.  Bilateral ovaries are unremarkable in appearance.  No ascites or pneumoperitoneum, and no pathologic distension of bowel.  Normal appendix.  No definite pathologic lymphadenopathy identified within the abdomen or pelvis.  There is mild atherosclerosis of the abdominal and pelvic vasculature, without definite aneurysm.  Musculoskeletal: There are no aggressive appearing lytic or blastic lesions noted in the visualized portions of the skeleton.  IMPRESSION: 1.  No acute abnormality within the abdomen or pelvis to account for the patient's  symptoms. 2.  Specifically, the appendix is normal. Additionally, there is no evidence of significant colonic diverticula. 3.  Small hiatal hernia. 4.  Mild atherosclerosis. 5.  Status post total abdominal hysterectomy.   Original Report Authenticated By: Etheleen Mayhew, M.D.     I personally reviewed the imaging tests through PACS system  I reviewed available ER/hospitalization records thought the EMR   1. Abdominal pain         MDM  The patient's left side of her abdomen is mildly tender.  CT scan demonstrates no abnormalities.  She was hypoglycemic on arrival however she's been taking her insulin but had not yet today.  She'll be fed in the emergency department. She did require dextrose.  She's been instructed to followup with her primary care physician tomorrow.  She does have her chronic wound of her right heel with a wound VAC in place.  She's instructed to followup with her wound specialist.  This will not need to be dealt with today.  Her hemoglobin is stable for her   I personally performed the services described in this documentation, which was scribed in my presence. The recorded information has been reviewed and considered.            Hoy Morn, MD 02/06/12 [phone]

## 2012-02-06 NOTE — ED Notes (Signed)
md was notified of blood sugar of 35, oj given, blood sugar rechecked and was 48, md notified of current bs, amp d50 ordered and given.   Pt has been alert throughout and able to speak clearly,  Drank all of contrast for ab ct scan.

## 2012-02-06 NOTE — ED Notes (Signed)
Pt.'s restless leg syndrome is persistant

## 2012-02-06 NOTE — ED Notes (Signed)
Pt reports that she has been having left lower quad ab pain off and on for 1 month, pain became worse yesterday and is constant, denies any n/v or diarrhea, last bm, 2 days ago.  Has been taking stool softeners.

## 2012-02-06 NOTE — ED Notes (Signed)
Pt c/o left side abd pain for a week, has had the problem before, pt states "I keep telling my dr's but they don't listen", pt states that the pain is worse last night and today, pt also c/o dressing change on right foot, pt has wound vac to wound on right foot and needs the dressing changed today, states that she will  Not be able to get it done until tomorrow.

## 2012-02-06 NOTE — ED Notes (Signed)
Lab called pt's blood sugar level to rn, md aware and pt currently being treated.

## 2012-02-06 NOTE — ED Notes (Signed)
Blood sugar was rechecked and is 163.

## 2012-02-07 ENCOUNTER — Encounter: Payer: Self-pay | Admitting: Vascular Surgery

## 2012-02-08 ENCOUNTER — Encounter: Payer: Self-pay | Admitting: Vascular Surgery

## 2012-02-08 ENCOUNTER — Ambulatory Visit (INDEPENDENT_AMBULATORY_CARE_PROVIDER_SITE_OTHER): Payer: Medicare Other | Admitting: Vascular Surgery

## 2012-02-08 VITALS — BP 159/82 | HR 113 | Resp 20 | Ht 70.0 in | Wt 261.0 lb

## 2012-02-08 DIAGNOSIS — S91309A Unspecified open wound, unspecified foot, initial encounter: Secondary | ICD-10-CM | POA: Insufficient documentation

## 2012-02-08 DIAGNOSIS — I739 Peripheral vascular disease, unspecified: Secondary | ICD-10-CM

## 2012-02-08 NOTE — Progress Notes (Signed)
Subjective:     Patient ID: Mary [name], female   DOB: [birth year], 66 y.o.   MRN: [id number]  HPI this 66 year old female was referred by Dr. Zada Girt for a diabetic right heel ulcer. Patient states that she had her right first toe amputated approximately 2 months ago with good healing. She developed a blistered area on her right heel which has continued to worsen and, an extensive heel ulcer. The patient would not allow me to examine the ulcer as it had been dressed by the wound Center yesterday. They did share a photo of this with me and it did appear to be an extensive heel ulcer. Patient has diabetes mellitus type 1. Has no history of ulcers in the contralateral left leg. Denies a history of rest pain prior to the onset of the symptoms but does have some degree of decreased sensation and neuropathy.  Past Medical History  Diagnosis Date  . CHF (congestive heart failure)   . Diabetes mellitus   . Restless leg syndrome 07/25/2011  . OSA (obstructive sleep apnea) 07/25/2011  . Wound, open     right foot    History  Substance Use Topics  . Smoking status: Never Smoker   . Smokeless tobacco: Never Used  . Alcohol Use: No    Family History  Problem Relation Age of Onset  . Diabetes Sister     No Known Allergies  Current outpatient prescriptions:ALPRAZolam (XANAX) 1 MG tablet, Take 1 mg by mouth every 6 (six) hours as needed. Anxiety/Restless Leg, Disp: , Rfl: ;  aspirin EC 81 MG EC tablet, Take 1 tablet (81 mg total) by mouth daily., Disp: 30 tablet, Rfl: 1;  cholecalciferol (VITAMIN D) 1000 UNITS tablet, Take 1,000 Units by mouth 2 (two) times daily., Disp: , Rfl: ;  cilostazol (PLETAL) 100 MG tablet, Take 100 mg by mouth daily., Disp: , Rfl:  ciprofloxacin (CIPRO) 500 MG tablet, Take 500 mg by mouth 2 (two) times daily., Disp: , Rfl: ;  ferrous sulfate 325 (65 FE) MG EC tablet, Take 325 mg by mouth daily. , Disp: , Rfl: ;  HYDROcodone-acetaminophen (NORCO/VICODIN) 5-325 MG per  tablet, Take 1 tablet by mouth every 4 (four) hours as needed for pain., Disp: 15 tablet, Rfl: 0;  insulin detemir (LEVEMIR) 100 UNIT/ML injection, Inject 23 Units into the skin daily. , Disp: , Rfl:  insulin lispro protamine-insulin lispro (HUMALOG 50/50) (50-50) 100 UNIT/ML SUSP, Inject 60-90 Units into the skin 3 (three) times daily with meals. Sliding scale per MD, Disp: , Rfl: ;  lisinopril (PRINIVIL,ZESTRIL) 20 MG tablet, Take 20 mg by mouth daily. Don't take this medicine until you have seen your physician on Tuesday., Disp: , Rfl:  metFORMIN (GLUCOPHAGE) 1000 MG tablet, Take 1,000 mg by mouth 2 (two) times daily with a meal., Disp: , Rfl: ;  metoprolol succinate (TOPROL-XL) 100 MG 24 hr tablet, Take 1 tablet (100 mg total) by mouth daily. Take with or immediately following a meal., Disp: 30 tablet, Rfl: 6;  Multiple Vitamin (MULTIVITAMIN WITH MINERALS) TABS, Take 1 tablet by mouth daily., Disp: , Rfl:  omega-3 acid ethyl esters (LOVAZA) 1 G capsule, Take 1 g by mouth 2 (two) times daily., Disp: , Rfl: ;  ondansetron (ZOFRAN ODT) 8 MG disintegrating tablet, Take 1 tablet (8 mg total) by mouth every 8 (eight) hours as needed for nausea., Disp: 10 tablet, Rfl: 0;  pregabalin (LYRICA) 75 MG capsule, Take 225 mg by mouth at bedtime., Disp: , Rfl: ;  rOPINIRole (REQUIP) 2 MG tablet, Take 2 mg by mouth at bedtime., Disp: , Rfl:  simvastatin (ZOCOR) 40 MG tablet, Take 40 mg by mouth every evening., Disp: , Rfl: ;  traMADol (ULTRAM) 50 MG tablet, Take 50 mg by mouth 4 (four) times daily as needed. FOR PAIN, Disp: , Rfl:   BP 159/82  Pulse 113  Resp 20  Ht 5\' 10"  (1.778 m)  Wt 261 lb (118.389 kg)  BMI 37.45 kg/m2  LMP 07/25/2011  Body mass index is 37.45 kg/(m^2).          Review of Systems denies chest pain, dyspnea on exertion, PND, orthopnea. Does complain of decreased sensation in feet. Other systems negative and complete review of systems    Objective:   Physical Exam blood pressure  159/82 heart rate 113 respirations 20 Gen.-alert and oriented x3 in no apparent distress-very anxious and upset  HEENT normal for age Lungs no rhonchi or wheezing Cardiovascular regular rhythm no murmurs carotid pulses 3+ palpable no bruits audible Abdomen soft nontender no palpable masses Musculoskeletal free of  major deformities Skin clear -no rashes Neurologic normal Lower extremities 3+ femoral and popliteal pulses palpable bilaterally. Left foot with 2-3+ dorsalis pedis pulse palpable. Right foot has dressing in place which patient will not allow me to remove. There is excellent biphasic Doppler flow in the high anterior tibial and posterior tibial arteries at the ankle level.  I reviewed lower extremity arterial Doppler studies which were recently performed in August of 2013 and [facility] hospital. ABI approximate 1.0 bilaterally with good brisk waveforms.      Assessment:     Diabetic right heel ulcer with peripheral neuropathy and possibly mild tibial occlusive disease-excellent ABIs and excellent flow to foot    Plan:     No need for further arterial evaluation-continue wound care debridement and hyperbaric oxygen as indicated

## 2012-02-12 ENCOUNTER — Emergency Department (HOSPITAL_COMMUNITY): Payer: Medicare Other

## 2012-02-12 ENCOUNTER — Encounter (HOSPITAL_COMMUNITY): Payer: Self-pay | Admitting: Emergency Medicine

## 2012-02-12 ENCOUNTER — Inpatient Hospital Stay (HOSPITAL_COMMUNITY)
Admission: EM | Admit: 2012-02-12 | Discharge: 2012-02-17 | DRG: 872 | Disposition: A | Discharge status: Home Health | Payer: Medicare Other | Attending: Internal Medicine | Admitting: Internal Medicine

## 2012-02-12 DIAGNOSIS — F411 Generalized anxiety disorder: Secondary | ICD-10-CM | POA: Diagnosis present

## 2012-02-12 DIAGNOSIS — I10 Essential (primary) hypertension: Secondary | ICD-10-CM

## 2012-02-12 DIAGNOSIS — Z6839 Body mass index (BMI) 39.0-39.9, adult: Secondary | ICD-10-CM

## 2012-02-12 DIAGNOSIS — E1159 Type 2 diabetes mellitus with other circulatory complications: Secondary | ICD-10-CM | POA: Diagnosis present

## 2012-02-12 DIAGNOSIS — R7881 Bacteremia: Secondary | ICD-10-CM

## 2012-02-12 DIAGNOSIS — A419 Sepsis, unspecified organism: Secondary | ICD-10-CM | POA: Diagnosis present

## 2012-02-12 DIAGNOSIS — R739 Hyperglycemia, unspecified: Secondary | ICD-10-CM

## 2012-02-12 DIAGNOSIS — L02619 Cutaneous abscess of unspecified foot: Secondary | ICD-10-CM

## 2012-02-12 DIAGNOSIS — S98119A Complete traumatic amputation of unspecified great toe, initial encounter: Secondary | ICD-10-CM

## 2012-02-12 DIAGNOSIS — R531 Weakness: Secondary | ICD-10-CM

## 2012-02-12 DIAGNOSIS — R3129 Other microscopic hematuria: Secondary | ICD-10-CM

## 2012-02-12 DIAGNOSIS — Z79899 Other long term (current) drug therapy: Secondary | ICD-10-CM

## 2012-02-12 DIAGNOSIS — IMO0002 Reserved for concepts with insufficient information to code with codable children: Secondary | ICD-10-CM | POA: Diagnosis present

## 2012-02-12 DIAGNOSIS — E1149 Type 2 diabetes mellitus with other diabetic neurological complication: Secondary | ICD-10-CM | POA: Diagnosis present

## 2012-02-12 DIAGNOSIS — E861 Hypovolemia: Secondary | ICD-10-CM | POA: Diagnosis present

## 2012-02-12 DIAGNOSIS — F419 Anxiety disorder, unspecified: Secondary | ICD-10-CM | POA: Diagnosis present

## 2012-02-12 DIAGNOSIS — J9600 Acute respiratory failure, unspecified whether with hypoxia or hypercapnia: Secondary | ICD-10-CM

## 2012-02-12 DIAGNOSIS — M869 Osteomyelitis, unspecified: Secondary | ICD-10-CM

## 2012-02-12 DIAGNOSIS — I798 Other disorders of arteries, arterioles and capillaries in diseases classified elsewhere: Secondary | ICD-10-CM | POA: Diagnosis present

## 2012-02-12 DIAGNOSIS — R000 Tachycardia, unspecified: Secondary | ICD-10-CM

## 2012-02-12 DIAGNOSIS — G2581 Restless legs syndrome: Secondary | ICD-10-CM

## 2012-02-12 DIAGNOSIS — E11649 Type 2 diabetes mellitus with hypoglycemia without coma: Secondary | ICD-10-CM

## 2012-02-12 DIAGNOSIS — E1142 Type 2 diabetes mellitus with diabetic polyneuropathy: Secondary | ICD-10-CM | POA: Diagnosis present

## 2012-02-12 DIAGNOSIS — S91309A Unspecified open wound, unspecified foot, initial encounter: Secondary | ICD-10-CM

## 2012-02-12 DIAGNOSIS — L03115 Cellulitis of right lower limb: Secondary | ICD-10-CM

## 2012-02-12 DIAGNOSIS — X58XXXA Exposure to other specified factors, initial encounter: Secondary | ICD-10-CM | POA: Diagnosis present

## 2012-02-12 DIAGNOSIS — D649 Anemia, unspecified: Secondary | ICD-10-CM

## 2012-02-12 DIAGNOSIS — M9080 Osteopathy in diseases classified elsewhere, unspecified site: Secondary | ICD-10-CM | POA: Diagnosis present

## 2012-02-12 DIAGNOSIS — Z7982 Long term (current) use of aspirin: Secondary | ICD-10-CM

## 2012-02-12 DIAGNOSIS — L03119 Cellulitis of unspecified part of limb: Secondary | ICD-10-CM | POA: Diagnosis present

## 2012-02-12 DIAGNOSIS — S93499A Sprain of other ligament of unspecified ankle, initial encounter: Secondary | ICD-10-CM | POA: Diagnosis present

## 2012-02-12 DIAGNOSIS — J209 Acute bronchitis, unspecified: Secondary | ICD-10-CM

## 2012-02-12 DIAGNOSIS — Z794 Long term (current) use of insulin: Secondary | ICD-10-CM

## 2012-02-12 DIAGNOSIS — L0291 Cutaneous abscess, unspecified: Secondary | ICD-10-CM

## 2012-02-12 DIAGNOSIS — E785 Hyperlipidemia, unspecified: Secondary | ICD-10-CM

## 2012-02-12 DIAGNOSIS — N390 Urinary tract infection, site not specified: Secondary | ICD-10-CM | POA: Diagnosis present

## 2012-02-12 DIAGNOSIS — R509 Fever, unspecified: Secondary | ICD-10-CM

## 2012-02-12 DIAGNOSIS — I959 Hypotension, unspecified: Secondary | ICD-10-CM

## 2012-02-12 DIAGNOSIS — A409 Streptococcal sepsis, unspecified: Principal | ICD-10-CM | POA: Diagnosis present

## 2012-02-12 DIAGNOSIS — E875 Hyperkalemia: Secondary | ICD-10-CM

## 2012-02-12 DIAGNOSIS — S96819A Strain of other specified muscles and tendons at ankle and foot level, unspecified foot, initial encounter: Secondary | ICD-10-CM | POA: Diagnosis present

## 2012-02-12 DIAGNOSIS — E871 Hypo-osmolality and hyponatremia: Secondary | ICD-10-CM

## 2012-02-12 DIAGNOSIS — I509 Heart failure, unspecified: Secondary | ICD-10-CM

## 2012-02-12 DIAGNOSIS — E1169 Type 2 diabetes mellitus with other specified complication: Secondary | ICD-10-CM | POA: Diagnosis present

## 2012-02-12 DIAGNOSIS — E119 Type 2 diabetes mellitus without complications: Secondary | ICD-10-CM

## 2012-02-12 DIAGNOSIS — G4733 Obstructive sleep apnea (adult) (pediatric): Secondary | ICD-10-CM

## 2012-02-12 DIAGNOSIS — R6510 Systemic inflammatory response syndrome (SIRS) of non-infectious origin without acute organ dysfunction: Secondary | ICD-10-CM

## 2012-02-12 DIAGNOSIS — S86019A Strain of unspecified Achilles tendon, initial encounter: Secondary | ICD-10-CM

## 2012-02-12 DIAGNOSIS — L0390 Cellulitis, unspecified: Secondary | ICD-10-CM

## 2012-02-12 DIAGNOSIS — B955 Unspecified streptococcus as the cause of diseases classified elsewhere: Secondary | ICD-10-CM

## 2012-02-12 DIAGNOSIS — I739 Peripheral vascular disease, unspecified: Secondary | ICD-10-CM

## 2012-02-12 HISTORY — DX: Osteomyelitis, unspecified: M86.9

## 2012-02-12 HISTORY — DX: Cutaneous abscess of unspecified foot: L02.619

## 2012-02-12 HISTORY — DX: Cellulitis of unspecified part of limb: L03.119

## 2012-02-12 HISTORY — DX: Type 2 diabetes mellitus without complications: E11.9

## 2012-02-12 HISTORY — DX: Strain of unspecified achilles tendon, initial encounter: S86.019A

## 2012-02-12 LAB — URINALYSIS, ROUTINE W REFLEX MICROSCOPIC
Ketones, ur: NEGATIVE mg/dL
Leukocytes, UA: NEGATIVE
Nitrite: NEGATIVE
Protein, ur: NEGATIVE mg/dL
Urobilinogen, UA: 0.2 mg/dL (ref 0.0–1.0)

## 2012-02-12 LAB — COMPREHENSIVE METABOLIC PANEL
Alkaline Phosphatase: 101 U/L (ref 39–117)
BUN: 34 mg/dL — ABNORMAL HIGH (ref 6–23)
Chloride: 93 mEq/L — ABNORMAL LOW (ref 96–112)
Creatinine, Ser: 1.04 mg/dL (ref 0.50–1.10)
GFR calc Af Amer: 63 mL/min — ABNORMAL LOW (ref >90)
GFR calc non Af Amer: 55 mL/min — ABNORMAL LOW (ref >90)
Glucose, Bld: 433 mg/dL — ABNORMAL HIGH (ref 70–99)
Potassium: 4.3 mEq/L (ref 3.5–5.1)
Total Bilirubin: 0.5 mg/dL (ref 0.3–1.2)

## 2012-02-12 LAB — CBC WITH DIFFERENTIAL/PLATELET
HCT: 27.9 % — ABNORMAL LOW (ref 36.0–46.0)
Hemoglobin: 9.5 g/dL — ABNORMAL LOW (ref 12.0–15.0)
Lymphs Abs: 0.7 10*3/uL (ref 0.7–4.0)
MCH: 26.8 pg (ref 26.0–34.0)
Monocytes Absolute: 0.9 10*3/uL (ref 0.1–1.0)
Monocytes Relative: 7 % (ref 3–12)
Neutro Abs: 12.1 10*3/uL — ABNORMAL HIGH (ref 1.7–7.7)
Neutrophils Relative %: 88 % — ABNORMAL HIGH (ref 43–77)
RBC: 3.54 MIL/uL — ABNORMAL LOW (ref 3.87–5.11)

## 2012-02-12 LAB — GLUCOSE, CAPILLARY: Glucose-Capillary: 408 mg/dL — ABNORMAL HIGH (ref 70–99)

## 2012-02-12 LAB — URINE MICROSCOPIC-ADD ON

## 2012-02-12 MED ORDER — HYDROCODONE-ACETAMINOPHEN 5-325 MG PO TABS
1.0000 | ORAL_TABLET | ORAL | Status: DC | PRN
  Administered 2012-02-13 – 2012-02-17 (×6): 2 via ORAL
  Administered 2012-02-17: 1 via ORAL
  Filled 2012-02-12 (×6): qty 2
  Filled 2012-02-12 (×2): qty 1

## 2012-02-12 MED ORDER — PIPERACILLIN-TAZOBACTAM 3.375 G IVPB
INTRAVENOUS | Status: AC
  Filled 2012-02-12: qty 50

## 2012-02-12 MED ORDER — METOPROLOL SUCCINATE ER 50 MG PO TB24
100.0000 mg | ORAL_TABLET | Freq: Every day | ORAL | Status: DC
  Administered 2012-02-13: 100 mg via ORAL
  Filled 2012-02-12: qty 2

## 2012-02-12 MED ORDER — CILOSTAZOL 100 MG PO TABS
100.0000 mg | ORAL_TABLET | Freq: Every day | ORAL | Status: DC
  Administered 2012-02-13 – 2012-02-17 (×5): 100 mg via ORAL
  Filled 2012-02-12 (×7): qty 1

## 2012-02-12 MED ORDER — PIPERACILLIN-TAZOBACTAM 3.375 G IVPB
3.3750 g | Freq: Three times a day (TID) | INTRAVENOUS | Status: DC
  Administered 2012-02-13 – 2012-02-17 (×14): 3.375 g via INTRAVENOUS
  Filled 2012-02-12 (×17): qty 50

## 2012-02-12 MED ORDER — INSULIN DETEMIR 100 UNIT/ML ~~LOC~~ SOLN
SUBCUTANEOUS | Status: AC
  Filled 2012-02-12: qty 10

## 2012-02-12 MED ORDER — PREGABALIN 75 MG PO CAPS
225.0000 mg | ORAL_CAPSULE | Freq: Once | ORAL | Status: AC
  Administered 2012-02-12: 225 mg via ORAL

## 2012-02-12 MED ORDER — ACETAMINOPHEN 500 MG PO TABS: 1000.0000 mg | ORAL_TABLET | Freq: Once | ORAL | Status: DC

## 2012-02-12 MED ORDER — INSULIN REGULAR HUMAN 100 UNIT/ML IJ SOLN
6.0000 [IU] | Freq: Once | INTRAMUSCULAR | Status: AC
  Administered 2012-02-12: 6 [IU] via INTRAVENOUS
  Filled 2012-02-12: qty 0.06

## 2012-02-12 MED ORDER — ONDANSETRON HCL 4 MG/2ML IJ SOLN: 4.0000 mg | Freq: Four times a day (QID) | INTRAMUSCULAR | Status: DC | PRN

## 2012-02-12 MED ORDER — OMEGA-3-ACID ETHYL ESTERS 1 G PO CAPS
1.0000 g | ORAL_CAPSULE | Freq: Two times a day (BID) | ORAL | Status: DC
  Administered 2012-02-13 – 2012-02-17 (×9): 1 g via ORAL
  Filled 2012-02-12 (×9): qty 1

## 2012-02-12 MED ORDER — PIPERACILLIN-TAZOBACTAM 3.375 G IVPB
3.3750 g | Freq: Once | INTRAVENOUS | Status: AC
  Administered 2012-02-12: 3.375 g via INTRAVENOUS
  Filled 2012-02-12: qty 50

## 2012-02-12 MED ORDER — VANCOMYCIN HCL IN DEXTROSE 1-5 GM/200ML-% IV SOLN
1000.0000 mg | Freq: Once | INTRAVENOUS | Status: AC
  Administered 2012-02-12: 1000 mg via INTRAVENOUS
  Filled 2012-02-12: qty 200

## 2012-02-12 MED ORDER — PREGABALIN 75 MG PO CAPS
ORAL_CAPSULE | ORAL | Status: AC
  Administered 2012-02-12: 225 mg via ORAL
  Filled 2012-02-12: qty 3

## 2012-02-12 MED ORDER — ADULT MULTIVITAMIN W/MINERALS CH
1.0000 | ORAL_TABLET | Freq: Every day | ORAL | Status: DC
  Administered 2012-02-13 – 2012-02-17 (×5): 1 via ORAL
  Filled 2012-02-12 (×5): qty 1

## 2012-02-12 MED ORDER — VITAMIN D 1000 UNITS PO TABS
1000.0000 [IU] | ORAL_TABLET | Freq: Two times a day (BID) | ORAL | Status: DC
  Administered 2012-02-13 – 2012-02-17 (×9): 1000 [IU] via ORAL
  Filled 2012-02-12 (×9): qty 1

## 2012-02-12 MED ORDER — VANCOMYCIN HCL IN DEXTROSE 1-5 GM/200ML-% IV SOLN
1000.0000 mg | Freq: Three times a day (TID) | INTRAVENOUS | Status: DC
  Administered 2012-02-13 – 2012-02-14 (×4): 1000 mg via INTRAVENOUS
  Filled 2012-02-12 (×5): qty 200

## 2012-02-12 MED ORDER — SODIUM CHLORIDE 0.9 % IV SOLN
INTRAVENOUS | Status: AC
  Administered 2012-02-12: 1000 mL via INTRAVENOUS

## 2012-02-12 MED ORDER — LISINOPRIL 10 MG PO TABS
20.0000 mg | ORAL_TABLET | Freq: Every day | ORAL | Status: DC
  Administered 2012-02-13: 20 mg via ORAL
  Filled 2012-02-12: qty 2

## 2012-02-12 MED ORDER — SODIUM CHLORIDE 0.9 % IJ SOLN
3.0000 mL | Freq: Two times a day (BID) | INTRAMUSCULAR | Status: DC
  Administered 2012-02-13 (×2): 3 mL via INTRAVENOUS
  Filled 2012-02-12 (×2): qty 3

## 2012-02-12 MED ORDER — PREGABALIN 75 MG PO CAPS
225.0000 mg | ORAL_CAPSULE | Freq: Every day | ORAL | Status: DC
  Administered 2012-02-13 – 2012-02-16 (×4): 225 mg via ORAL
  Filled 2012-02-12 (×4): qty 3

## 2012-02-12 MED ORDER — INSULIN LISPRO PROT & LISPRO (50-50 MIX) 100 UNIT/ML ~~LOC~~ SUSP
60.0000 [IU] | Freq: Three times a day (TID) | SUBCUTANEOUS | Status: DC
  Filled 2012-02-12: qty 3

## 2012-02-12 MED ORDER — ONDANSETRON 4 MG PO TBDP: 8.0000 mg | ORAL_TABLET | Freq: Three times a day (TID) | ORAL | Status: DC | PRN

## 2012-02-12 MED ORDER — INSULIN DETEMIR 100 UNIT/ML ~~LOC~~ SOLN
23.0000 [IU] | Freq: Every day | SUBCUTANEOUS | Status: DC
  Administered 2012-02-12: 23 [IU] via SUBCUTANEOUS
  Filled 2012-02-12: qty 10

## 2012-02-12 MED ORDER — ASPIRIN EC 81 MG PO TBEC
81.0000 mg | DELAYED_RELEASE_TABLET | Freq: Every day | ORAL | Status: DC
  Administered 2012-02-13 – 2012-02-17 (×5): 81 mg via ORAL
  Filled 2012-02-12 (×5): qty 1

## 2012-02-12 MED ORDER — VANCOMYCIN HCL IN DEXTROSE 1-5 GM/200ML-% IV SOLN
INTRAVENOUS | Status: AC
  Filled 2012-02-12: qty 200

## 2012-02-12 MED ORDER — ALPRAZOLAM 0.5 MG PO TABS
1.0000 mg | ORAL_TABLET | Freq: Once | ORAL | Status: AC
  Administered 2012-02-12: 1 mg via ORAL
  Filled 2012-02-12: qty 2

## 2012-02-12 MED ORDER — ROPINIROLE HCL 1 MG PO TABS
2.0000 mg | ORAL_TABLET | Freq: Every day | ORAL | Status: DC
  Administered 2012-02-13 – 2012-02-16 (×4): 2 mg via ORAL
  Filled 2012-02-12 (×6): qty 2

## 2012-02-12 MED ORDER — ONDANSETRON HCL 4 MG PO TABS: 4.0000 mg | ORAL_TABLET | Freq: Four times a day (QID) | ORAL | Status: DC | PRN

## 2012-02-12 MED ORDER — ALPRAZOLAM 1 MG PO TABS
1.0000 mg | ORAL_TABLET | Freq: Four times a day (QID) | ORAL | Status: DC | PRN
  Administered 2012-02-13: 1 mg via ORAL
  Filled 2012-02-12: qty 1

## 2012-02-12 MED ORDER — SIMVASTATIN 20 MG PO TABS
40.0000 mg | ORAL_TABLET | Freq: Every evening | ORAL | Status: DC
  Administered 2012-02-13 – 2012-02-16 (×4): 40 mg via ORAL
  Filled 2012-02-12 (×4): qty 2

## 2012-02-12 NOTE — H&P (Signed)
PCP:   Wende Neighbors, MD   Chief Complaint:  fever  HPI: 66 yo female dm open wound to rle on /off abx as outpt with wound vac currently on cipro po comes in with fever, redness around the wound.  No purulent discharge.  No cough.  No dyruria.  Started hyperbaric tx also recently.    Review of Systems:  O/w neg  Past Medical History: Past Medical History  Diagnosis Date  . CHF (congestive heart failure)   . Diabetes mellitus   . Restless leg syndrome 07/25/2011  . OSA (obstructive sleep apnea) 07/25/2011  . Wound, open     right foot   Past Surgical History  Procedure Date  . Abdominal hysterectomy   . Achilles tendon repair   . Toe amputation     partial rt great toe    Medications: Prior to Admission medications   Medication Sig Start Date End Date Taking? Authorizing Provider  ALPRAZolam Duanne Moron) 1 MG tablet Take 1 mg by mouth every 6 (six) hours as needed. Anxiety/Restless Leg   Yes Historical Provider, MD  aspirin EC 81 MG EC tablet Take 1 tablet (81 mg total) by mouth daily. 07/26/11 07/25/12 Yes Kathie Dike, MD  cholecalciferol (VITAMIN D) 1000 UNITS tablet Take 1,000 Units by mouth 2 (two) times daily.   Yes Historical Provider, MD  cilostazol (PLETAL) 100 MG tablet Take 100 mg by mouth daily.   Yes Historical Provider, MD  ciprofloxacin (CIPRO) 500 MG tablet Take 500 mg by mouth 2 (two) times daily.   Yes Historical Provider, MD  ferrous sulfate 325 (65 FE) MG EC tablet Take 325 mg by mouth daily.    Yes Historical Provider, MD  HYDROcodone-acetaminophen (NORCO/VICODIN) 5-325 MG per tablet Take 1 tablet by mouth every 4 (four) hours as needed for pain. 02/06/12 02/16/12 Yes Hoy Morn, MD  lisinopril (PRINIVIL,ZESTRIL) 20 MG tablet Take 20 mg by mouth daily. Don't take this medicine until you have seen your physician on Tuesday. 11/12/11  Yes Nimish Luther Parody, MD  metFORMIN (GLUCOPHAGE) 1000 MG tablet Take 1,000 mg by mouth 2 (two) times daily with a meal.   Yes  Historical Provider, MD  metoprolol succinate (TOPROL-XL) 100 MG 24 hr tablet Take 1 tablet (100 mg total) by mouth daily. Take with or immediately following a meal. 11/08/11  Yes Lendon Colonel, NP  Multiple Vitamin (MULTIVITAMIN WITH MINERALS) TABS Take 1 tablet by mouth daily.   Yes Historical Provider, MD  omega-3 acid ethyl esters (LOVAZA) 1 G capsule Take 1 g by mouth 2 (two) times daily.   Yes Historical Provider, MD  ondansetron (ZOFRAN ODT) 8 MG disintegrating tablet Take 1 tablet (8 mg total) by mouth every 8 (eight) hours as needed for nausea. 02/06/12 02/13/12 Yes Hoy Morn, MD  pregabalin (LYRICA) 75 MG capsule Take 225 mg by mouth at bedtime.   Yes Historical Provider, MD  rOPINIRole (REQUIP) 2 MG tablet Take 2 mg by mouth at bedtime.   Yes Historical Provider, MD  simvastatin (ZOCOR) 40 MG tablet Take 40 mg by mouth every evening.   Yes Historical Provider, MD  traMADol (ULTRAM) 50 MG tablet Take 50 mg by mouth 4 (four) times daily as needed. FOR PAIN   Yes Historical Provider, MD  insulin detemir (LEVEMIR) 100 UNIT/ML injection Inject 23 Units into the skin daily.     Historical Provider, MD  insulin lispro protamine-insulin lispro (HUMALOG 50/50) (50-50) 100 UNIT/ML SUSP Inject 60-90 Units into the skin  3 (three) times daily with meals. Sliding scale per MD    Historical Provider, MD    Allergies:  No Known Allergies  Social History:  reports that she has never smoked. She has never used smokeless tobacco. She reports that she does not drink alcohol or use illicit drugs.  Family History: Family History  Problem Relation Age of Onset  . Diabetes Sister     Physical Exam: Filed Vitals:   02/12/12 1930 02/12/12 1941 02/12/12 2003 02/12/12 2030  BP: 98/26  113/40 127/44  Pulse: 109  113 109  Temp:  98.9 F (37.2 C)    TempSrc:  Oral    Resp: 26   24  Height:      Weight:      SpO2: 96%  97% 95%   General appearance: alert, cooperative and no distress Lungs:  clear to auscultation bilaterally Heart: regular rate and rhythm, S1, S2 normal, no murmur, click, rub or gallop Abdomen: soft, non-tender; bowel sounds normal; no masses,  no organomegaly Extremities: extremities normal, atraumatic, no cyanosis or edema Pulses: 2+ and symmetric Skin: wound to rle heal with surrounding celllulits up to ankle area foul smelling no discharge Neurologic: Grossly normal    Labs on Admission:   [facility] 02/12/12 1824  NA 126*  K 4.3  CL 93*  CO2 22  GLUCOSE 433*  BUN 34*  CREATININE 1.04  CALCIUM 8.2*  MG --  PHOS --    Basename 02/12/12 1824  AST 13  ALT 12  ALKPHOS 101  BILITOT 0.5  PROT 6.5  ALBUMIN 2.5*    Basename 02/12/12 1824  WBC 13.8*  NEUTROABS 12.1*  HGB 9.5*  HCT 27.9*  MCV 78.8  PLT 263   Radiological Exams on Admission: Ct Abdomen Pelvis W Contrast  02/06/2012  *RADIOLOGY REPORT*  Clinical Data: Left lower quadrant abdominal pain.  CT ABDOMEN AND PELVIS WITH CONTRAST  Technique:  Multidetector CT imaging of the abdomen and pelvis was performed following the standard protocol during bolus administration of intravenous contrast.  Contrast: 167mL OMNIPAQUE IOHEXOL 300 MG/ML  SOLN  Comparison: No priors.  Findings:  Lung Bases: Small hiatal hernia.  Otherwise, unremarkable.  Abdomen/Pelvis:  Contrast bolus is suboptimal, limiting sensitivity for detection of visceral vascular abnormalities.  With these limitations in mind, the appearance of the liver, gallbladder, pancreas, spleen, bilateral adrenal glands and bilateral kidneys is unremarkable.  Status post total abdominal hysterectomy.  Bilateral ovaries are unremarkable in appearance.  No ascites or pneumoperitoneum, and no pathologic distension of bowel.  Normal appendix.  No definite pathologic lymphadenopathy identified within the abdomen or pelvis.  There is mild atherosclerosis of the abdominal and pelvic vasculature, without definite aneurysm.  Musculoskeletal: There are no  aggressive appearing lytic or blastic lesions noted in the visualized portions of the skeleton.  IMPRESSION: 1.  No acute abnormality within the abdomen or pelvis to account for the patient's symptoms. 2.  Specifically, the appendix is normal. Additionally, there is no evidence of significant colonic diverticula. 3.  Small hiatal hernia. 4.  Mild atherosclerosis. 5.  Status post total abdominal hysterectomy.   Original Report Authenticated By: Etheleen Mayhew, M.D.    Dg Chest Portable 1 View  02/12/2012  *RADIOLOGY REPORT*  Clinical Data: 66 year old female with fever.  PORTABLE CHEST - 1 VIEW  Comparison: 01/15/2012 and prior chest radiographs  Findings: Mild cardiomegaly is again noted. Peribronchial thickening is unchanged. Elevation of the right hemidiaphragm is also again identified. There is no evidence  of focal airspace disease, pulmonary edema, suspicious pulmonary nodule/mass, pleural effusion, or pneumothorax. No acute bony abnormalities are identified.  IMPRESSION: Cardiomegaly and chronic peribronchial thickening - no evidence of acute cardiopulmonary disease.   Original Report Authenticated By: Lura Em, M.D.    Dg Chest Port 1 View  01/15/2012  *RADIOLOGY REPORT*  Clinical Data: Shortness of breath.  Diabetes.  Nonsmoker.  PORTABLE CHEST - 1 VIEW  Comparison: 07/26/2011  Findings: Mildly degraded exam due to AP portable technique and patient body habitus.  Midline trachea.  Cardiomegaly accentuated by AP portable technique.  Mild right hemidiaphragm elevation.  No right-sided pleural effusion.  Degraded evaluation of the left lung base and left pleural space. No pneumothorax.  Low lung volumes with resultant pulmonary interstitial prominence.  Right lung and left upper lung are clear.  IMPRESSION:  1. Decreased sensitivity and specificity exam due to technique related factors, as described above. 2.  Cardiomegaly and low lung volumes without definite acute disease.  Original Report  Authenticated By: Areta Haber, M.D.   Dg Foot 2 Views Right  01/24/2012  *RADIOLOGY REPORT*  Clinical Data: 66 year old female with diabetic ulcer on the bottom of the right foot.  RIGHT FOOT - 2 VIEW  Comparison: Calcaneus views 01/12/2012.  Findings: Dressing material overlying the right heel.  Increased soft tissue defect since the comparison.  The mineralization of the calcaneus underlying this level is stable.  Degenerative spurring again noted.  Previous amputation of the distal phalanx of the first toe.  No definite osteolysis or acute fracture or dislocation.  Degenerative changes again noted at the talonavicular articulation.  IMPRESSION: Soft tissue defect overlying the calcaneus.  No plain radiographic evidence of acute osteomyelitis.   Original Report Authenticated By: Randall An, M.D.    Dg Foot Complete Right  02/12/2012  *RADIOLOGY REPORT*  Clinical Data: Nonhealing wound overlying the right heel.  RIGHT FOOT COMPLETE - 3+ VIEW  Comparison: 01/24/2012  Findings: There is no evidence of fracture, subluxation, dislocation or osteomyelitis. Heel soft tissue swelling is noted.  A small to moderate calcaneal spur is present. Amputation of the great toe distal phalanx is again noted. Mild midfoot degenerative changes are present. Vascular calcifications are again identified.  IMPRESSION: Heel soft tissue swelling without acute bony abnormality or radiographic evidence of osteomyelitis.   Original Report Authenticated By: Lura Em, M.D.    Ct Orbitss W/o Cm  01/24/2012  *RADIOLOGY REPORT*  Clinical Data: Right orbital pain and swelling status post fall.  CT ORBITS WITHOUT CONTRAST  Technique:  Multidetector CT imaging of the orbits was performed following the standard protocol without intravenous contrast.  Comparison: None.  Findings: There is moderate preorbital soft tissue swelling on the right.  No post septal hematoma is identified.  There is no evidence of globe injury or lens  displacement.  The optic nerves and extraocular muscles appear normal.  There is no evidence of orbital or other upper facial fracture. There are no paranasal sinus air-fluid levels.  There is mucosal thickening inferiorly in the right maxillary sinus.  There is apparent periodontal disease involving the left maxillary canine (tooth 11), incompletely imaged.  Some of the right maxillary teeth are absent.  IMPRESSION:  1.  Moderate preseptal soft tissue swelling in the right orbit.  No evidence of post septal hematoma or globe injury. 2.  No evidence of orbital fracture. 3.  Right maxillary sinus mucosal thickening and suspected periodontal disease.   Original Report Authenticated By: Gwyndolyn Saxon  B. Lin Landsman, M.D.     Assessment/Plan Present on Admission:  66 yo female rle wound/cellulitis  .Open wound of heel .Cellulitis .Diabetes mellitus .Morbid obesity .PVD (peripheral vascular disease) .Hypertension  Iv vanc/zosyn.  Gentle ivf overnight.  ua neg. cxr neg.  Wound care consult.  Pulses intact.   Tayvin Preslar A [id number] 02/12/2012, 9:22 PM

## 2012-02-12 NOTE — ED Notes (Addendum)
Wound to left heal covered with nonadherent dressing and wrapped in gauze.  Previous wound vac in place, wound vac dressing removed at nursing assessment.

## 2012-02-12 NOTE — ED Notes (Signed)
Patient states that she is feeling very restless and has a history of restless leg syndrome, states she usually takes xanax at home and has not had her medications today.

## 2012-02-12 NOTE — ED Notes (Signed)
Pt states chills and fever. Sent over by home health nurse for fever of 104.1. O2 sat at 88% RA.HR of 120. Diabetic Wound to right foot with wound vac, draining dark yellow fluid.

## 2012-02-12 NOTE — ED Notes (Addendum)
Per EMS, pt suspected to have wound infection 103.1 Fever-given Tylenol 1000, NS 350; CBG 503, wound drainage noted to be dark in color.

## 2012-02-12 NOTE — Progress Notes (Signed)
PATIENT VERY SEDATED FROM MEDS GIVEN IN ED.  UNABLE TO ANSWER QUESTIONS ON ASSESSMENT. UNABLE TO TAKE NIGHT MEDS BECAUSE SHE IS TO SEDATED, SHE WILL AROUSE BUT ONLY FOR A COUPLE OF SECONDS, THEN SHE GOES BACK TO SLEEP. WILL CONTINUE TO MONITOR.

## 2012-02-12 NOTE — ED Provider Notes (Signed)
History   This chart was scribed for Janice Norrie, MD scribed by Mitchell Heir. The patient was seen in room APA02/APA02 at 17:57    CSN: [id number]  Arrival date & time 02/12/12  1740      Chief Complaint  Patient presents with  . Wound Infection    (Consider location/radiation/quality/duration/timing/severity/associated sxs/prior treatment) The history is provided by the patient. No language interpreter was used.   Mary [name] is a 66 y.o. female BIB [facility] who presents to the Emergency Department complaining of a suspected wound infection with associated chills, increased drowsiness and sleeping, and a fever of 104.1 at home, onset today.  Patient has a deep ulcer on her right foot that she has been treating at a wound care center in [place]. Patient has had 4 hyperbaric treatments and explains she has been placed in the hyperbaric chamber for treatment of her foot, with her last treatment in the hyperbaric being yesterday. Today, she says her home health nurse came in and noticed she very warm. She says she checked her temperature and noticed it was a 104.1, thus recommended she go to the ED.Pt states she started getting chills yesterday during her hyperbaric treatment and has had chills today.  Her HHN came today for the first time and noted a fever of 104.1 and had her come to the ED.  Patient has a wound vac placed on her right foot and notes drainage has been clear, watery and pinkish in color. She denies cough, runny nose, ST, vomiting, diarrhea, dysuria, urinary frequency and says her swelling in her right leg is better since treatment. However, the relative notes yesterday she began having pain and says it has appeared more red. Patient has been taking cipro, prescribed from Dr. Nils Pyle, for the past week. She says she has 15 days left on the cipro and explains that she was placed on cipro because previous abx was not working well.  Patient ambulates with a walker and says today  was the first visit from [facility] nurse. Patient does not smoke or drink.  PCP: Dr.Hall Wound Specialist: Dr. Nils Pyle  Past Medical History  Diagnosis Date  . CHF (congestive heart failure)   . Diabetes mellitus   . Restless leg syndrome 07/25/2011  . OSA (obstructive sleep apnea) 07/25/2011  . Wound, open     right foot    Past Surgical History  Procedure Date  . Abdominal hysterectomy   . Achilles tendon repair   . Toe amputation     partial rt great toe    Family History  Problem Relation Age of Onset  . Diabetes Sister     History  Substance Use Topics  . Smoking status: Never Smoker   . Smokeless tobacco: Never Used  . Alcohol Use: No   Lives at home Lives with spouse Uses a walker, needs a wheelchair  Review of Systems 10 Systems reviewed and are negative for acute change except as noted in the HPI. Allergies  Review of patient's allergies indicates no known allergies.  Home Medications   Current Outpatient Rx  Name Route Sig Dispense Refill  . ALPRAZOLAM 1 MG PO TABS Oral Take 1 mg by mouth every 6 (six) hours as needed. Anxiety/Restless Leg    . ASPIRIN 81 MG PO TBEC Oral Take 1 tablet (81 mg total) by mouth daily. 30 tablet 1  . VITAMIN D 1000 UNITS PO TABS Oral Take 1,000 Units by mouth 2 (two) times daily.    Marland Kitchen  CILOSTAZOL 100 MG PO TABS Oral Take 100 mg by mouth daily.    Marland Kitchen CIPROFLOXACIN HCL 500 MG PO TABS Oral Take 500 mg by mouth 2 (two) times daily.    Marland Kitchen FERROUS SULFATE 325 (65 FE) MG PO TBEC Oral Take 325 mg by mouth daily.     Marland Kitchen HYDROCODONE-ACETAMINOPHEN 5-325 MG PO TABS Oral Take 1 tablet by mouth every 4 (four) hours as needed for pain. 15 tablet 0  . INSULIN DETEMIR 100 UNIT/ML [place] SOLN Subcutaneous Inject 23 Units into the skin daily.     . INSULIN LISPRO PROT & LISPRO (50-50) 100 UNIT/ML  SUSP Subcutaneous Inject 60-90 Units into the skin 3 (three) times daily with meals. Sliding scale per MD    . LISINOPRIL 20 MG PO TABS Oral  Take 20 mg by mouth daily. Don't take this medicine until you have seen your physician on Tuesday.    Marland Kitchen METFORMIN HCL 1000 MG PO TABS Oral Take 1,000 mg by mouth 2 (two) times daily with a meal.    . METOPROLOL SUCCINATE ER 100 MG PO TB24 Oral Take 1 tablet (100 mg total) by mouth daily. Take with or immediately following a meal. 30 tablet 6  . ADULT MULTIVITAMIN W/MINERALS CH Oral Take 1 tablet by mouth daily.    . OMEGA-3-ACID ETHYL ESTERS 1 G PO CAPS Oral Take 1 g by mouth 2 (two) times daily.    Marland Kitchen ONDANSETRON 8 MG PO TBDP Oral Take 1 tablet (8 mg total) by mouth every 8 (eight) hours as needed for nausea. 10 tablet 0  . PREGABALIN 75 MG PO CAPS Oral Take 225 mg by mouth at bedtime.    Marland Kitchen ROPINIROLE HCL 2 MG PO TABS Oral Take 2 mg by mouth at bedtime.    Marland Kitchen SIMVASTATIN 40 MG PO TABS Oral Take 40 mg by mouth every evening.    Marland Kitchen TRAMADOL HCL 50 MG PO TABS Oral Take 50 mg by mouth 4 (four) times daily as needed. FOR PAIN      BP 122/55  Temp 103 F (39.4 C) (Oral)  Heart rate 113/ Resp 28  Ht 5\' 10"  (1.778 m)  Wt 256 lb (116.121 kg)  BMI 36.73 kg/m2  SpO2 94%  LMP 07/25/2011  Vital signs normal except fever,tachycardia   Physical Exam  Nursing note and vitals reviewed. Constitutional: She is oriented to person, place, and time. She appears well-developed and well-nourished. No distress.  HENT:  Head: Normocephalic and atraumatic.  Right Ear: External ear normal.  Left Ear: External ear normal.  Nose: Nose normal.       Tongue is dry.    Eyes: Conjunctivae normal and EOM are normal. Pupils are equal, round, and reactive to light.       Swelling of the sclera on lateral right eye from when she hit her eye at the end of August  Neck: Normal range of motion. Neck supple. No tracheal deviation present.  Cardiovascular: Normal rate, regular rhythm and normal heart sounds.   Pulmonary/Chest: Effort normal and breath sounds normal. No respiratory distress. She has no wheezes. She has no  rales. She exhibits no tenderness.  Abdominal: Soft. Bowel sounds are normal. She exhibits no distension. There is no tenderness. There is no rebound and no guarding.  Musculoskeletal: Normal range of motion. She exhibits no edema and no tenderness.       Right lower leg has a partial amputation of right great toe. Redness and warmth of the medial  aspect of the right lower leg. Wound VAC on right heel. 2 cm ulcer on the heel surrounded by white skin with redness around he medial ankle and extending up to lower leg.   Neurological: She is alert and oriented to person, place, and time. No sensory deficit.  Skin: Skin is warm and dry.  Psychiatric: She has a normal mood and affect. Her behavior is normal.    ED Course  Procedures (including critical care time)   Medications  vancomycin (VANCOCIN) IVPB 1000 mg/200 mL premix (not administered)  piperacillin-tazobactam (ZOSYN) IVPB 3.375 g (3.375 g Intravenous New Bag/Given 02/12/12 1957)  insulin regular (NOVOLIN R,HUMULIN R) 100 units/mL injection 6 Units (6 Units Intravenous Given 02/12/12 1956)  ALPRAZolam (XANAX) tablet 1 mg (1 mg Oral Given 02/12/12 1954)  pregabalin (LYRICA) capsule 225 mg (225 mg Oral Given 02/12/12 2021)    DIAGNOSTIC STUDIES: Oxygen Saturation is 94% on room air, adequate by my interpretation.    COORDINATION OF CARE: 18:04: Physical exam performed.  PT had brief episode of borderline hypotension. Her fever deferevesced with the tylenol given by the [facility].   19:54: Consult with hospitalist, Dr. Shanon Brow started. Patient case discussed.   19:58: Dr. Shanon Brow will admit to telemetry team one.    Medications administered in the ED  Results for orders placed during the hospital encounter of 02/12/12  GLUCOSE, CAPILLARY      Component Value Range   Glucose-Capillary 408 (*) 70 - 99 mg/dL   Comment 1 Documented in Chart     Comment 2 Call MD NNP PA CNM    CULTURE, BLOOD (ROUTINE X 2)      Component Value Range   Specimen  Description RIGHT ANTECUBITAL     Special Requests BOTTLES DRAWN AEROBIC AND ANAEROBIC 4CC     Culture PENDING     Report Status PENDING    CULTURE, BLOOD (ROUTINE X 2)      Component Value Range   Specimen Description BLOOD LEFT HAND     Special Requests BOTTLES DRAWN AEROBIC AND ANAEROBIC 6CC     Culture PENDING     Report Status PENDING    CBC WITH DIFFERENTIAL      Component Value Range   WBC 13.8 (*) 4.0 - 10.5 K/uL   RBC 3.54 (*) 3.87 - 5.11 MIL/uL   Hemoglobin 9.5 (*) 12.0 - 15.0 g/dL   HCT 27.9 (*) 36.0 - 46.0 %   MCV 78.8  78.0 - 100.0 fL   MCH 26.8  26.0 - 34.0 pg   MCHC 34.1  30.0 - 36.0 g/dL   RDW 15.5  11.5 - 15.5 %   Platelets 263  150 - 400 K/uL   Neutrophils Relative 88 (*) 43 - 77 %   Neutro Abs 12.1 (*) 1.7 - 7.7 K/uL   Lymphocytes Relative 5 (*) 12 - 46 %   Lymphs Abs 0.7  0.7 - 4.0 K/uL   Monocytes Relative 7  3 - 12 %   Monocytes Absolute 0.9  0.1 - 1.0 K/uL   Eosinophils Relative 0  0 - 5 %   Eosinophils Absolute 0.0  0.0 - 0.7 K/uL   Basophils Relative 0  0 - 1 %   Basophils Absolute 0.0  0.0 - 0.1 K/uL  COMPREHENSIVE METABOLIC PANEL      Component Value Range   Sodium 126 (*) 135 - 145 mEq/L   Potassium 4.3  3.5 - 5.1 mEq/L   Chloride 93 (*) 96 - 112  mEq/L   CO2 22  19 - 32 mEq/L   Glucose, Bld 433 (*) 70 - 99 mg/dL   BUN 34 (*) 6 - 23 mg/dL   Creatinine, Ser 1.04  0.50 - 1.10 mg/dL   Calcium 8.2 (*) 8.4 - 10.5 mg/dL   Total Protein 6.5  6.0 - 8.3 g/dL   Albumin 2.5 (*) 3.5 - 5.2 g/dL   AST 13  0 - 37 U/L   ALT 12  0 - 35 U/L   Alkaline Phosphatase 101  39 - 117 U/L   Total Bilirubin 0.5  0.3 - 1.2 mg/dL   GFR calc non Af Amer 55 (*) >90 mL/min   GFR calc Af Amer 63 (*) >90 mL/min  URINALYSIS, ROUTINE W REFLEX MICROSCOPIC      Component Value Range   Color, Urine YELLOW  YELLOW   APPearance HAZY (*) CLEAR   Specific Gravity, Urine 1.020  1.005 - 1.030   pH 5.5  5.0 - 8.0   Glucose, UA >1000 (*) NEGATIVE mg/dL   Hgb urine dipstick LARGE  (*) NEGATIVE   Bilirubin Urine NEGATIVE  NEGATIVE   Ketones, ur NEGATIVE  NEGATIVE mg/dL   Protein, ur NEGATIVE  NEGATIVE mg/dL   Urobilinogen, UA 0.2  0.0 - 1.0 mg/dL   Nitrite NEGATIVE  NEGATIVE   Leukocytes, UA NEGATIVE  NEGATIVE  URINE MICROSCOPIC-ADD ON      Component Value Range   Squamous Epithelial / LPF FEW (*) RARE   WBC, UA 7-10  <3 WBC/hpf   RBC / HPF 11-20  <3 RBC/hpf   Bacteria, UA MANY (*) RARE   Casts GRANULAR CAST (*) NEGATIVE   Laboratory interpretation all normal except for leukocytosis, hyponatremia, hyperglycemia, elevated BUN consistent with dehydration, glucosuria, ? UTI   Dg Chest Portable 1 View  02/12/2012  *RADIOLOGY REPORT*  Clinical Data: 66 year old female with fever.  PORTABLE CHEST - 1 VIEW  Comparison: 01/15/2012 and prior chest radiographs  Findings: Mild cardiomegaly is again noted. Peribronchial thickening is unchanged. Elevation of the right hemidiaphragm is also again identified. There is no evidence of focal airspace disease, pulmonary edema, suspicious pulmonary nodule/mass, pleural effusion, or pneumothorax. No acute bony abnormalities are identified.  IMPRESSION: Cardiomegaly and chronic peribronchial thickening - no evidence of acute cardiopulmonary disease.   Original Report Authenticated By: Lura Em, M.D.   Dg Foot Complete Right  02/12/2012  *RADIOLOGY REPORT*  Clinical Data: Nonhealing wound overlying the right heel.  RIGHT FOOT COMPLETE - 3+ VIEW  Comparison: 01/24/2012  Findings: There is no evidence of fracture, subluxation, dislocation or osteomyelitis. Heel soft tissue swelling is noted.  A small to moderate calcaneal spur is present. Amputation of the great toe distal phalanx is again noted. Mild midfoot degenerative changes are present. Vascular calcifications are again identified.  IMPRESSION: Heel soft tissue swelling without acute bony abnormality or radiographic evidence of osteomyelitis.   Original Report Authenticated By: Lura Em, M.D.      1. Fever   2. Cellulitis of right lower leg   3. Hyperglycemia   4. Anemia   5. Hypotension    Plan admission  Rolland Porter, MD, FACEP   MDM  I personally performed the services described in this documentation, which was scribed in my presence. The recorded information has been reviewed and considered.  Rolland Porter, MD, Abram Sander         Janice Norrie, MD 02/12/12 2022

## 2012-02-12 NOTE — ED Notes (Signed)
Patient continues to be very restless, states she does not want to stay here at the hospital.

## 2012-02-13 ENCOUNTER — Encounter (HOSPITAL_COMMUNITY): Payer: Self-pay | Admitting: Internal Medicine

## 2012-02-13 DIAGNOSIS — E871 Hypo-osmolality and hyponatremia: Secondary | ICD-10-CM | POA: Diagnosis present

## 2012-02-13 DIAGNOSIS — I739 Peripheral vascular disease, unspecified: Secondary | ICD-10-CM

## 2012-02-13 DIAGNOSIS — S91309A Unspecified open wound, unspecified foot, initial encounter: Secondary | ICD-10-CM

## 2012-02-13 DIAGNOSIS — L02619 Cutaneous abscess of unspecified foot: Secondary | ICD-10-CM

## 2012-02-13 DIAGNOSIS — D649 Anemia, unspecified: Secondary | ICD-10-CM

## 2012-02-13 DIAGNOSIS — N390 Urinary tract infection, site not specified: Secondary | ICD-10-CM | POA: Diagnosis present

## 2012-02-13 DIAGNOSIS — R3129 Other microscopic hematuria: Secondary | ICD-10-CM | POA: Diagnosis present

## 2012-02-13 LAB — COMPREHENSIVE METABOLIC PANEL
AST: 12 U/L (ref 0–37)
Albumin: 2.1 g/dL — ABNORMAL LOW (ref 3.5–5.2)
BUN: 33 mg/dL — ABNORMAL HIGH (ref 6–23)
Chloride: 99 mEq/L (ref 96–112)
Creatinine, Ser: 0.98 mg/dL (ref 0.50–1.10)
Total Bilirubin: 0.3 mg/dL (ref 0.3–1.2)
Total Protein: 5.7 g/dL — ABNORMAL LOW (ref 6.0–8.3)

## 2012-02-13 LAB — IRON AND TIBC
Iron: 11 ug/dL — ABNORMAL LOW (ref 42–135)
TIBC: 183 ug/dL — ABNORMAL LOW (ref 250–470)

## 2012-02-13 LAB — CBC
HCT: 24.4 % — ABNORMAL LOW (ref 36.0–46.0)
Hemoglobin: 8.1 g/dL — ABNORMAL LOW (ref 12.0–15.0)
MCH: 26.1 pg (ref 26.0–34.0)
MCV: 78.7 fL (ref 78.0–100.0)
Platelets: 250 10*3/uL (ref 150–400)
RBC: 3.1 MIL/uL — ABNORMAL LOW (ref 3.87–5.11)
WBC: 15.2 10*3/uL — ABNORMAL HIGH (ref 4.0–10.5)

## 2012-02-13 LAB — FOLATE: Folate: 16 ng/mL

## 2012-02-13 LAB — GLUCOSE, CAPILLARY
Glucose-Capillary: 352 mg/dL — ABNORMAL HIGH (ref 70–99)
Glucose-Capillary: 98 mg/dL (ref 70–99)

## 2012-02-13 LAB — HEMOGLOBIN A1C
Hgb A1c MFr Bld: 10.5 % — ABNORMAL HIGH (ref <5.7)
Mean Plasma Glucose: 255 mg/dL — ABNORMAL HIGH (ref <117)

## 2012-02-13 LAB — CK: Total CK: 111 U/L (ref 7–177)

## 2012-02-13 LAB — SEDIMENTATION RATE: Sed Rate: 80 mm/hr — ABNORMAL HIGH (ref 0–22)

## 2012-02-13 LAB — VITAMIN B12: Vitamin B-12: 689 pg/mL (ref 211–911)

## 2012-02-13 LAB — T4, FREE: Free T4: 0.94 ng/dL (ref 0.80–1.80)

## 2012-02-13 LAB — RETICULOCYTES: Retic Count, Absolute: 31.2 10*3/uL (ref 19.0–186.0)

## 2012-02-13 LAB — TSH: TSH: 6.697 u[IU]/mL — ABNORMAL HIGH (ref 0.350–4.500)

## 2012-02-13 MED ORDER — INFLUENZA VIRUS VACC SPLIT PF IM SUSP
0.5000 mL | INTRAMUSCULAR | Status: AC
  Administered 2012-02-14: 0.5 mL via INTRAMUSCULAR
  Filled 2012-02-13: qty 0.5

## 2012-02-13 MED ORDER — INSULIN ASPART 100 UNIT/ML ~~LOC~~ SOLN
0.0000 [IU] | Freq: Three times a day (TID) | SUBCUTANEOUS | Status: DC
  Administered 2012-02-13: 15 [IU] via SUBCUTANEOUS
  Administered 2012-02-13: 20 [IU] via SUBCUTANEOUS
  Administered 2012-02-14 (×3): 7 [IU] via SUBCUTANEOUS
  Administered 2012-02-15: 3 [IU] via SUBCUTANEOUS
  Administered 2012-02-15: 7 [IU] via SUBCUTANEOUS
  Administered 2012-02-15 – 2012-02-16 (×2): 11 [IU] via SUBCUTANEOUS
  Administered 2012-02-16: 4 [IU] via SUBCUTANEOUS
  Administered 2012-02-16: 7 [IU] via SUBCUTANEOUS
  Administered 2012-02-17 (×2): 4 [IU] via SUBCUTANEOUS

## 2012-02-13 MED ORDER — ALPRAZOLAM 0.5 MG PO TABS
0.5000 mg | ORAL_TABLET | Freq: Four times a day (QID) | ORAL | Status: DC | PRN
  Administered 2012-02-13 – 2012-02-17 (×7): 0.5 mg via ORAL
  Filled 2012-02-13 (×8): qty 1

## 2012-02-13 MED ORDER — SODIUM CHLORIDE 0.9 % IV BOLUS (SEPSIS)
500.0000 mL | Freq: Once | INTRAVENOUS | Status: AC
  Administered 2012-02-13: 500 mL via INTRAVENOUS

## 2012-02-13 MED ORDER — SODIUM CHLORIDE 0.9 % IJ SOLN
INTRAMUSCULAR | Status: AC
  Filled 2012-02-13: qty 3

## 2012-02-13 MED ORDER — PREGABALIN 50 MG PO CAPS
50.0000 mg | ORAL_CAPSULE | Freq: Once | ORAL | Status: AC
  Administered 2012-02-13: 50 mg via ORAL
  Filled 2012-02-13: qty 1

## 2012-02-13 MED ORDER — ROPINIROLE HCL 1 MG PO TABS
ORAL_TABLET | ORAL | Status: AC
  Filled 2012-02-13: qty 2

## 2012-02-13 MED ORDER — METOPROLOL SUCCINATE ER 50 MG PO TB24: 50.0000 mg | ORAL_TABLET | Freq: Two times a day (BID) | ORAL | Status: DC

## 2012-02-13 MED ORDER — LISINOPRIL 5 MG PO TABS: 5.0000 mg | ORAL_TABLET | Freq: Every day | ORAL | Status: DC

## 2012-02-13 MED ORDER — INSULIN DETEMIR 100 UNIT/ML ~~LOC~~ SOLN
15.0000 [IU] | Freq: Two times a day (BID) | SUBCUTANEOUS | Status: DC
  Administered 2012-02-13 – 2012-02-17 (×8): 15 [IU] via SUBCUTANEOUS
  Filled 2012-02-13: qty 10

## 2012-02-13 MED ORDER — INSULIN ASPART 100 UNIT/ML ~~LOC~~ SOLN
10.0000 [IU] | Freq: Three times a day (TID) | SUBCUTANEOUS | Status: DC
  Administered 2012-02-13 – 2012-02-17 (×14): 10 [IU] via SUBCUTANEOUS

## 2012-02-13 MED ORDER — INSULIN ASPART 100 UNIT/ML ~~LOC~~ SOLN: 0.0000 [IU] | Freq: Every day | SUBCUTANEOUS | Status: DC

## 2012-02-13 NOTE — Progress Notes (Signed)
CRITICAL VALUE ALERT  Critical value received:  Blood culture +, anaerobic bottle, gram+ cocci in chains  Date of notification:  02/13/2012  Time of notification:  2311  Critical value read back:yes  Nurse who received alert:  Reynold Bowen, RN  MD notified (1st page):  Shanon Brow  Time of first page:  2316  MD notified (2nd page):  Time of second page:  Responding MD:  Shanon Brow  Time MD responded:  [phone]

## 2012-02-13 NOTE — Progress Notes (Signed)
Text paged Dr. Shanon Brow the pts last 3 documented BP's.  Passed on to [place] the night nurse.

## 2012-02-13 NOTE — Progress Notes (Signed)
Patient is stable, Dr, Shanon Brow ordered a 563ml bolus for the patient to get. Will continue to monitor the patient.

## 2012-02-13 NOTE — Progress Notes (Signed)
ANTIBIOTIC CONSULT NOTE - INITIAL  Pharmacy Consult for Vancomycin and Zosyn Indication: cellulitis, wound, admitted with fever  No Known Allergies  Patient Measurements: Height: 5\' 10"  (177.8 cm) Weight: 272 lb 4.3 oz (123.5 kg) IBW/kg (Calculated) : 68.5   Vital Signs: Temp: 98.5 F (36.9 C) (09/15 0635) Temp src: Axillary (09/15 0635) BP: 102/67 mmHg (09/15 0635) Pulse Rate: 65  (09/15 0635) Intake/Output from previous day:   Intake/Output from this shift:    Labs:  [facility] 02/13/12 0450 02/12/12 1824  WBC 15.2* 13.8*  HGB 8.1* 9.5*  PLT 250 263  LABCREA -- --  CREATININE 0.98 1.04   Estimated Creatinine Clearance: 80.7 ml/min (by C-G formula based on Cr of 0.98). No results found for this basename: VANCOTROUGH:2,VANCOPEAK:2,VANCORANDOM:2,GENTTROUGH:2,GENTPEAK:2,GENTRANDOM:2,TOBRATROUGH:2,TOBRAPEAK:2,TOBRARND:2,AMIKACINPEAK:2,AMIKACINTROU:2,AMIKACIN:2, in the last 72 hours   Microbiology: Recent Results (from the past 720 hour(s))  CULTURE, BLOOD (ROUTINE X 2)     Status: Normal (Preliminary result)   Collection Time   02/12/12  6:24 PM      Component Value Range Status Comment   Specimen Description RIGHT ANTECUBITAL   Final    Special Requests BOTTLES DRAWN AEROBIC AND ANAEROBIC 4CC   Final    Culture NO GROWTH 1 DAY   Final    Report Status PENDING   Incomplete   CULTURE, BLOOD (ROUTINE X 2)     Status: Normal (Preliminary result)   Collection Time   02/12/12  6:30 PM      Component Value Range Status Comment   Specimen Description BLOOD LEFT HAND   Final    Special Requests BOTTLES DRAWN AEROBIC AND ANAEROBIC 6CC   Final    Culture NO GROWTH 1 DAY   Final    Report Status PENDING   Incomplete    Medical History: Past Medical History  Diagnosis Date  . CHF (congestive heart failure)   . Diabetes mellitus   . Restless leg syndrome 07/25/2011  . OSA (obstructive sleep apnea) 07/25/2011  . Wound, open     right foot  . Diabetes mellitus with neuropathy  07/25/2011   Medications:  Scheduled:    . ALPRAZolam  1 mg Oral Once  . aspirin EC  81 mg Oral Daily  . cholecalciferol  1,000 Units Oral BID  . cilostazol  100 mg Oral Daily  . influenza  inactive virus vaccine  0.5 mL Intramuscular Tomorrow-1000  . insulin aspart  0-20 Units Subcutaneous TID WC  . insulin aspart  0-5 Units Subcutaneous QHS  . insulin aspart  10 Units Subcutaneous TID WC  . insulin detemir  15 Units Subcutaneous BID  . insulin regular  6 Units Intravenous Once  . lisinopril  20 mg Oral Daily  . metoprolol succinate  100 mg Oral Daily  . multivitamin with minerals  1 tablet Oral Daily  . omega-3 acid ethyl esters  1 g Oral BID  . piperacillin-tazobactam  3.375 g Intravenous Once  . piperacillin-tazobactam (ZOSYN)  IV  3.375 g Intravenous Q8H  . pregabalin  225 mg Oral Once  . pregabalin  225 mg Oral QHS  . rOPINIRole  2 mg Oral QHS  . simvastatin  40 mg Oral QPM  . sodium chloride  3 mL Intravenous Q12H  . vancomycin  1,000 mg Intravenous Once  . vancomycin  1,000 mg Intravenous Q8H  . DISCONTD: acetaminophen  1,000 mg Oral Once  . DISCONTD: insulin detemir  23 Units Subcutaneous Daily  . DISCONTD: insulin lispro protamine-insulin lispro  60-90 Units Subcutaneous TID WC  Assessment: 66yo obese female admitted with LE cellulitis, fever, and would with drainage.  Now afebrile.  Good renal fxn. Estimated Creatinine Clearance: 80.7 ml/min (by C-G formula based on Cr of 0.98).  ABX started last pm.  Goal of Therapy:  Vancomycin trough level 10-15 mcg/ml Eradicate infection.  Plan: Zosyn 3.375gm iv q8hrs Vancomycin 1gm iv q8hrs Check trough tomorrow Monitor labs, renal fxn, and cultures per protocol  [place] Crane, Addley Ballinger A 02/13/2012,9:58 AM

## 2012-02-13 NOTE — Progress Notes (Signed)
Notified Dr. Caryn Section of patients bp 80/50.  Pt does not c/o of any issues at this time she is easily aroused with her name being called.  New orders given and followed.

## 2012-02-13 NOTE — Plan of Care (Signed)
Problem: Phase I Progression Outcomes Goal: Pain controlled with appropriate interventions Outcome: Progressing Discomfort r/t RLS.

## 2012-02-13 NOTE — Progress Notes (Signed)
Subjective: The patient is asleep, but she is arousable to name. She has no complaints of chest pain, shortness of breath, abdominal pain, or right foot pain.  Objective: Vital signs in last 24 hours: Filed Vitals:   02/12/12 2003 02/12/12 2030 02/12/12 2130 02/13/12 0635  BP: 113/40 127/44 120/88 102/67  Pulse: 113 109 103 65  Temp:   98.9 F (37.2 C) 98.5 F (36.9 C)  TempSrc:   Oral Axillary  Resp:  24 24 16   Height:      Weight:   122.834 kg (270 lb 12.8 oz) 123.5 kg (272 lb 4.3 oz)  SpO2: 97% 95% 96% 97%   No intake or output data in the 24 hours ending 02/13/12 1011  Weight change:   Physical exam: General: Obese 66 year old Caucasian woman lying in bed asleep, but she becomes alert. Lungs: Clear anteriorly with decreased breath sounds in the bases. Heart: S1, S2, with no murmurs rubs or gallops. Next line abdomen: Morbidly obese, positive bowel sounds, soft, nontender, nondistended. Extremities: No pedal/pretibial edema. Right foot heal with deep wound and surrounding erythema. The wound is pink, but with yellowish/grayish foul/malodorous drainage. Surrounding the wound is quite callous and bogginess. Minimally tender upon palpation. Pedal pulses barely palpable. Neurologic: She is initially asleep but becomes alert. She is oriented x3.  Lab Results: Basic Metabolic Panel:  Basename 02/13/12 0450 02/12/12 1824  NA 130* 126*  K 3.7 4.3  CL 99 93*  CO2 24 22  GLUCOSE 370* 433*  BUN 33* 34*  CREATININE 0.98 1.04  CALCIUM 7.9* 8.2*  MG -- --  PHOS -- --   Liver Function Tests:  Basename 02/13/12 0450 02/12/12 1824  AST 12 13  ALT 10 12  ALKPHOS 89 101  BILITOT 0.3 0.5  PROT 5.7* 6.5  ALBUMIN 2.1* 2.5*   No results found for this basename: LIPASE:2,AMYLASE:2 in the last 72 hours No results found for this basename: AMMONIA:2 in the last 72 hours CBC:  Basename 02/13/12 0450 02/12/12 1824  WBC 15.2* 13.8*  NEUTROABS -- 12.1*  HGB 8.1* 9.5*  HCT 24.4*  27.9*  MCV 78.7 78.8  PLT 250 263   Cardiac Enzymes: No results found for this basename: CKTOTAL:3,CKMB:3,CKMBINDEX:3,TROPONINI:3 in the last 72 hours BNP: No results found for this basename: PROBNP:3 in the last 72 hours D-Dimer: No results found for this basename: DDIMER:2 in the last 72 hours CBG:  Basename 02/13/12 0822 02/12/12 1758  GLUCAP 333* 408*   Hemoglobin A1C: No results found for this basename: HGBA1C in the last 72 hours Fasting Lipid Panel: No results found for this basename: CHOL,HDL,LDLCALC,TRIG,CHOLHDL,LDLDIRECT in the last 72 hours Thyroid Function Tests: No results found for this basename: TSH,T4TOTAL,FREET4,T3FREE,THYROIDAB in the last 72 hours Anemia Panel: No results found for this basename: VITAMINB12,FOLATE,FERRITIN,TIBC,IRON,RETICCTPCT in the last 72 hours Coagulation:  Basename 02/13/12 0450  LABPROT 16.4*  INR 1.30   Urine Drug Screen: Drugs of Abuse  No results found for this basename: labopia,  cocainscrnur,  labbenz,  amphetmu,  thcu,  labbarb    Alcohol Level: No results found for this basename: ETH:2 in the last 72 hours Urinalysis:  Basename 02/12/12 1910  COLORURINE YELLOW  LABSPEC 1.020  PHURINE 5.5  GLUCOSEU >1000*  HGBUR LARGE*  BILIRUBINUR NEGATIVE  KETONESUR NEGATIVE  PROTEINUR NEGATIVE  UROBILINOGEN 0.2  NITRITE NEGATIVE  [place]. Labs:   Micro: Recent Results (from the past 240 hour(s))  CULTURE, BLOOD (ROUTINE X 2)     Status:  Normal (Preliminary result)   Collection Time   02/12/12  6:24 PM      Component Value Range Status Comment   Specimen Description RIGHT ANTECUBITAL   Final    Special Requests BOTTLES DRAWN AEROBIC AND ANAEROBIC 4CC   Final    Culture NO GROWTH 1 DAY   Final    Report Status PENDING   Incomplete   CULTURE, BLOOD (ROUTINE X 2)     Status: Normal (Preliminary result)   Collection Time   02/12/12  6:30 PM      Component Value Range Status Comment   Specimen  Description BLOOD LEFT HAND   Final    Special Requests BOTTLES DRAWN AEROBIC AND ANAEROBIC 6CC   Final    Culture NO GROWTH 1 DAY   Final    Report Status PENDING   Incomplete     Studies/Results: Dg Chest Portable 1 View  02/12/2012  *RADIOLOGY REPORT*  Clinical Data: 66 year old female with fever.  PORTABLE CHEST - 1 VIEW  Comparison: 01/15/2012 and prior chest radiographs  Findings: Mild cardiomegaly is again noted. Peribronchial thickening is unchanged. Elevation of the right hemidiaphragm is also again identified. There is no evidence of focal airspace disease, pulmonary edema, suspicious pulmonary nodule/mass, pleural effusion, or pneumothorax. No acute bony abnormalities are identified.  IMPRESSION: Cardiomegaly and chronic peribronchial thickening - no evidence of acute cardiopulmonary disease.   Original Report Authenticated By: Lura Em, M.D.    Dg Foot Complete Right  02/12/2012  *RADIOLOGY REPORT*  Clinical Data: Nonhealing wound overlying the right heel.  RIGHT FOOT COMPLETE - 3+ VIEW  Comparison: 01/24/2012  Findings: There is no evidence of fracture, subluxation, dislocation or osteomyelitis. Heel soft tissue swelling is noted.  A small to moderate calcaneal spur is present. Amputation of the great toe distal phalanx is again noted. Mild midfoot degenerative changes are present. Vascular calcifications are again identified.  IMPRESSION: Heel soft tissue swelling without acute bony abnormality or radiographic evidence of osteomyelitis.   Original Report Authenticated By: Lura Em, M.D.     Medications:  Scheduled:   . ALPRAZolam  1 mg Oral Once  . aspirin EC  81 mg Oral Daily  . cholecalciferol  1,000 Units Oral BID  . cilostazol  100 mg Oral Daily  . influenza  inactive virus vaccine  0.5 mL Intramuscular Tomorrow-1000  . insulin aspart  0-20 Units Subcutaneous TID WC  . insulin aspart  0-5 Units Subcutaneous QHS  . insulin aspart  10 Units Subcutaneous TID WC  .  insulin detemir  15 Units Subcutaneous BID  . insulin regular  6 Units Intravenous Once  . lisinopril  20 mg Oral Daily  . metoprolol succinate  100 mg Oral Daily  . multivitamin with minerals  1 tablet Oral Daily  . omega-3 acid ethyl esters  1 g Oral BID  . piperacillin-tazobactam  3.375 g Intravenous Once  . piperacillin-tazobactam (ZOSYN)  IV  3.375 g Intravenous Q8H  . pregabalin  225 mg Oral Once  . pregabalin  225 mg Oral QHS  . rOPINIRole  2 mg Oral QHS  . simvastatin  40 mg Oral QPM  . sodium chloride  3 mL Intravenous Q12H  . vancomycin  1,000 mg Intravenous Once  . vancomycin  1,000 mg Intravenous Q8H  . DISCONTD: acetaminophen  1,000 mg Oral Once  . DISCONTD: insulin detemir  23 Units Subcutaneous Daily  . DISCONTD: insulin lispro protamine-insulin lispro  60-90 Units Subcutaneous TID WC  Continuous:   . sodium chloride 1,000 mL (02/12/12 2258)   [id number], HYDROcodone-acetaminophen, ondansetron (ZOFRAN) IV, ondansetron, ondansetron  Assessment: Principal Problem:  *Cellulitis and abscess of foot Active Problems:  Diabetes mellitus with neuropathy  OSA (obstructive sleep apnea)  Anemia  Hypertension  Morbid obesity  PVD (peripheral vascular disease)  Open wound of heel  Hematuria, microscopic    1. Cellulitis and abscess of the right foot/heel. The patient had a temperature of 103 on admission and 104 at home. She is afebrile now following the start of antibiotics. Her white blood cell count has increased a little. I am concerned about a deeper infection although the x-ray results were not suggestive of osteomyelitis. An MRI will be ordered for further evaluation. We'll continue vancomycin and Zosyn. Wound care consult has been ordered. Continue supportive dressing changes for now.  Pyuria/microscopic hematuria. The patient has no complaints of dysuria.  Uncontrolled type 2 diabetes mellitus with neuropathy and peripheral vascular disease. We'll continue  Lantus and sliding-scale NovoLog. We'll make adjustments accordingly.  Anemia. Her hemoglobin has fallen from 9.5-8.1. This may be in part dilutional. Will monitor and check an anemia panel.  Hypertension. The patient's blood pressure is on the low-normal side. This is likely secondary to hypovolemia.  Hyponatremia, secondary to hypovolemia. Improving with IV fluids. We'll continue to be cautious of IV fluid hydration in the setting of a history of congestive heart failure (diastolic).  Chronic anxiety and restless leg syndrome. She is on alprazolam as needed.    Plan:  1. We'll decrease the doses of metoprolol and lisinopril because of low normal blood pressures. Will place parameters to hold him for systolic blood pressure [id number]. 2. We'll order MRI of the right foot/heel for a better assessment of a deeper infection. 3. We'll order an anemia panel, Hemoccults, hemoglobin A1c, TSH, and free T4. 4. Will decrease alprazolam to a lower dose when necessary.   LOS: 1 day   Mary [name] 02/13/2012, 10:11 AM

## 2012-02-14 ENCOUNTER — Inpatient Hospital Stay (HOSPITAL_COMMUNITY): Payer: Medicare Other

## 2012-02-14 ENCOUNTER — Encounter (HOSPITAL_COMMUNITY): Payer: Self-pay | Admitting: Internal Medicine

## 2012-02-14 DIAGNOSIS — S86019A Strain of unspecified Achilles tendon, initial encounter: Secondary | ICD-10-CM | POA: Diagnosis present

## 2012-02-14 DIAGNOSIS — M869 Osteomyelitis, unspecified: Secondary | ICD-10-CM

## 2012-02-14 HISTORY — DX: Strain of unspecified achilles tendon, initial encounter: S86.019A

## 2012-02-14 HISTORY — DX: Osteomyelitis, unspecified: M86.9

## 2012-02-14 LAB — CBC
Hemoglobin: 9.2 g/dL — ABNORMAL LOW (ref 12.0–15.0)
MCH: 26.7 pg (ref 26.0–34.0)
MCHC: 33.5 g/dL (ref 30.0–36.0)
MCV: 79.9 fL (ref 78.0–100.0)
Platelets: 298 10*3/uL (ref 150–400)
RBC: 3.44 MIL/uL — ABNORMAL LOW (ref 3.87–5.11)

## 2012-02-14 LAB — GLUCOSE, CAPILLARY
Glucose-Capillary: 221 mg/dL — ABNORMAL HIGH (ref 70–99)
Glucose-Capillary: 157 mg/dL — ABNORMAL HIGH (ref 70–99)

## 2012-02-14 LAB — BASIC METABOLIC PANEL
CO2: 24 mEq/L (ref 19–32)
Calcium: 8.2 mg/dL — ABNORMAL LOW (ref 8.4–10.5)
Creatinine, Ser: 1.02 mg/dL (ref 0.50–1.10)
GFR calc non Af Amer: 56 mL/min — ABNORMAL LOW (ref >90)
Glucose, Bld: 247 mg/dL — ABNORMAL HIGH (ref 70–99)

## 2012-02-14 MED ORDER — SODIUM CHLORIDE 0.9 % IJ SOLN
INTRAMUSCULAR | Status: AC
  Administered 2012-02-14: 3 mL
  Filled 2012-02-14: qty 3

## 2012-02-14 MED ORDER — VANCOMYCIN HCL IN DEXTROSE 1-5 GM/200ML-% IV SOLN
1000.0000 mg | Freq: Two times a day (BID) | INTRAVENOUS | Status: DC
  Administered 2012-02-14 – 2012-02-17 (×6): 1000 mg via INTRAVENOUS
  Filled 2012-02-14 (×8): qty 200

## 2012-02-14 MED ORDER — METFORMIN HCL 500 MG PO TABS
1000.0000 mg | ORAL_TABLET | Freq: Two times a day (BID) | ORAL | Status: DC
  Administered 2012-02-14 – 2012-02-17 (×6): 1000 mg via ORAL
  Filled 2012-02-14 (×6): qty 2

## 2012-02-14 MED ORDER — PRO-STAT SUGAR FREE PO LIQD
30.0000 mL | Freq: Three times a day (TID) | ORAL | Status: DC
  Administered 2012-02-14 – 2012-02-17 (×9): 30 mL via ORAL
  Filled 2012-02-14 (×9): qty 30

## 2012-02-14 MED ORDER — GADOBENATE DIMEGLUMINE 529 MG/ML IV SOLN
20.0000 mL | Freq: Once | INTRAVENOUS | Status: AC | PRN
  Administered 2012-02-14: 20 mL via INTRAVENOUS

## 2012-02-14 MED ORDER — PREGABALIN 75 MG PO CAPS
75.0000 mg | ORAL_CAPSULE | Freq: Once | ORAL | Status: AC
  Administered 2012-02-14: 75 mg via ORAL
  Filled 2012-02-14: qty 1

## 2012-02-14 NOTE — Care Management Note (Signed)
    Page 1 of 2   02/17/2012     2:52:35 PM   CARE MANAGEMENT NOTE 02/17/2012  Patient:  Mary [name], Mary [name]   Account Number:  [id number]  Date Initiated:  02/14/2012  Documentation initiated by:  Claretha Cooper  Subjective/Objective Assessment:   Pt admitted from home with stage 4 cellulitis on R. foot. Pt had wound vac at home and [facility] was currently seeing pt. Pt fever was 104 and came to ER. Also now being followed by [employer] CM in communication with Duanne Limerick.     Action/Plan:   Spoke to pt and spouse at bedside. Pt is tearful about the thought of loosing her foot. Is very hopeful that home IV antibiotics will take care of the bone infection. Husband states he had given the IV meds in the past.   Anticipated DC Date:  02/17/2012   Anticipated DC Plan:  [place]  CM consult      Choice offered to / List presented to:          [facility] arranged  HH-1 RN  [place].   Status of service:  Completed, signed off Medicare Important Message given?  YES (If response is "NO", the following Medicare IM given date fields will be blank) Date Medicare IM given:  02/15/2012 Date Additional Medicare IM given:    Discharge Disposition:    Per UR Regulation:    If discussed at Long Length of Stay Meetings, dates discussed:   02/17/2012    Comments:  02/17/12 [place] BSN CM Pt concerned over cost of IV antibiotic. Both CM and [facility] advised pt that they would work with her on her bill. Pt is also going to check into changing back to traditional Medicare. To DC as soon as Blood Culture has identified the Anbiotic best to treat osteo.  02/14/12 [place] BSN CM

## 2012-02-14 NOTE — Progress Notes (Signed)
Subjective: The patient is more alert today. Her family is in the room. She has no complaints of chest pain, shortness of breath, or right foot pain at the moment.  Objective: Vital signs in last 24 hours: Filed Vitals:   02/13/12 1900 02/13/12 2137 02/13/12 2300 02/14/12 0554  BP: 94/58 109/67 104/63 104/70  Pulse: 65 76  76  Temp:  97.4 F (36.3 C)  98.2 F (36.8 C)  TempSrc:  Oral  Oral  Resp:  20  20  Height:      Weight:    126.5 kg (278 lb 14.1 oz)  SpO2:  95%  95%    Intake/Output Summary (Last 24 hours) at 02/14/12 1314 Last data filed at 02/14/12 0400  Gross per 24 hour  Intake   1240 ml  Output      0 ml  Net   1240 ml    Weight change: 10.379 kg (22 lb 14.1 oz)  Physical exam: General: Obese 66 year old Caucasian sitting up in bed, in no acute distress.  Lungs: Clear anteriorly with decreased breath sounds in the bases. Heart: S1, S2, with no murmurs rubs or gallops. Next line abdomen: Morbidly obese, positive bowel sounds, soft, nontender, nondistended. Extremities: No pedal/pretibial edema. Right foot heal with deep wound and mild  surrounding erythema. The wound is pink, but the  yellowish/grayish foul/malodorous drainage seen yesterday is no longer present. . Surrounding the wound is callousformation  and bogginess. Minimally tender upon palpation. Pedal pulses palpable. Neurologic: She is initially asleep but becomes alert. She is oriented x3.  Lab Results: Basic Metabolic Panel:  Basename 02/14/12 0502 02/13/12 0450  NA 133* 130*  K 4.5 3.7  CL 101 99  CO2 24 24  GLUCOSE 247* 370*  BUN 32* 33*  CREATININE 1.02 0.98  CALCIUM 8.2* 7.9*  MG -- --  PHOS -- --   Liver Function Tests:  Basename 02/13/12 0450 02/12/12 1824  AST 12 13  ALT 10 12  ALKPHOS 89 101  BILITOT 0.3 0.5  PROT 5.7* 6.5  ALBUMIN 2.1* 2.5*   No results found for this basename: LIPASE:2,AMYLASE:2 in the last 72 hours No results found for this basename: AMMONIA:2 in the last  72 hours CBC:  Basename 02/14/12 0502 02/13/12 0450 02/12/12 1824  WBC 9.5 15.2* --  NEUTROABS -- -- 12.1*  HGB 9.2* 8.1* --  HCT 27.5* 24.4* --  MCV 79.9 78.7 --  PLT 298 250 --   Cardiac Enzymes:  Basename 02/13/12 0800  CKTOTAL 111  CKMB --  CKMBINDEX --  TROPONINI --   BNP: No results found for this basename: PROBNP:3 in the last 72 hours D-Dimer: No results found for this basename: DDIMER:2 in the last 72 hours CBG:  Basename 02/14/12 1147 02/14/12 0738 02/13/12 2135 02/13/12 1627 02/13/12 1114 02/13/12 0822  GLUCAP 221* 210* 156* 98 352* 333*   Hemoglobin A1C:  Basename 02/13/12 0800  HGBA1C 10.5*   Fasting Lipid Panel: No results found for this basename: CHOL,HDL,LDLCALC,TRIG,CHOLHDL,LDLDIRECT in the last 72 hours Thyroid Function Tests:  Basename 02/13/12 0800  TSH 6.697*  T4TOTAL --  FREET4 0.94  T3FREE --  THYROIDAB --   Anemia Panel:  Basename 02/13/12 0800  VITAMINB12 689  FOLATE 16.0  FERRITIN 334*  TIBC 183*  IRON 11*  RETICCTPCT 0.9   Coagulation:  Basename 02/13/12 0450  LABPROT 16.4*  INR 1.30   Urine Drug Screen: Drugs of Abuse  No results found for this basename: labopia,  cocainscrnur,  labbenz,  amphetmu,  thcu,  labbarb    Alcohol Level: No results found for this basename: ETH:2 in the last 72 hours Urinalysis:  Basename 02/12/12 1910  COLORURINE YELLOW  LABSPEC 1.020  PHURINE 5.5  GLUCOSEU >1000*  HGBUR LARGE*  BILIRUBINUR NEGATIVE  KETONESUR NEGATIVE  PROTEINUR NEGATIVE  UROBILINOGEN 0.2  NITRITE NEGATIVE  LEUKOCYTESUR NEGATIVE   Misc. Labs:   Micro: Recent Results (from the past 240 hour(s))  CULTURE, BLOOD (ROUTINE X 2)     Status: Normal (Preliminary result)   Collection Time   02/12/12  6:24 PM      Component Value Range Status Comment   Specimen Description RIGHT ANTECUBITAL   Final    Special Requests BOTTLES DRAWN AEROBIC AND ANAEROBIC 4CC   Final    Culture     Final    Value: GRAM POSITIVE  COCCI IN CHAINS     Gram Stain Report Called to,Read Back By and Verified With:     HUTSON,L @ [id number] ON 02/13/12 BY WOODIE,J     GS DONE @ [facility]   Report Status PENDING   Incomplete   CULTURE, BLOOD (ROUTINE X 2)     Status: Normal (Preliminary result)   Collection Time   02/12/12  6:30 PM      Component Value Range Status Comment   Specimen Description BLOOD LEFT HAND   Final    Special Requests BOTTLES DRAWN AEROBIC AND ANAEROBIC 6CC   Final    Culture NO GROWTH 2 DAYS   Final    Report Status PENDING   Incomplete     Studies/Results: Mr Ankle Right W Wo Contrast  02/14/2012  *RADIOLOGY REPORT*  Clinical Data: Right ankle open wound.  Diabetic.  Osteomyelitis.  MRI OF THE RIGHT ANKLE WITHOUT AND WITH CONTRAST  Technique:  Multiplanar, multisequence MR imaging was performed both before and after administration of intravenous contrast.  Contrast: 10mL MULTIHANCE GADOBENATE DIMEGLUMINE 529 MG/ML IV SOLN  Comparison: 02/12/2012.  Findings: There is calcaneal osteomyelitis with bone marrow edema radiating through the calcaneal tuberosity.  Ulceration in the posterior heel extends up to the cortical surface with cortical osteolysis.  Large subtalar effusion may be reactive.  Septic arthritis is difficult to exclude.  The Achilles tendon shows an old tear 6 cm from the calcaneal tuberosity with retraction measuring 3 cm. There is no complete tear.  Ankle joint effusion is present which is probably reactive.  Marrow edema is present in the talus which is probably reactive.  Reactive edema is present in the posterior distal leg.  IMPRESSION:  1.  Calcaneal tuberosity osteomyelitis.  Overlying ulcer.  No abscess identified. 2.  Possible septic arthritis of the posterior subtalar joint. Reactive edema in the talus could be associated with septic arthritis or reactive. 3.  Chronic Achilles tendon near complete tear.   Original Report Authenticated By: Dereck Ligas, M.D.    Dg Chest Portable 1 View  02/12/2012   *RADIOLOGY REPORT*  Clinical Data: 66 year old female with fever.  PORTABLE CHEST - 1 VIEW  Comparison: 01/15/2012 and prior chest radiographs  Findings: Mild cardiomegaly is again noted. Peribronchial thickening is unchanged. Elevation of the right hemidiaphragm is also again identified. There is no evidence of focal airspace disease, pulmonary edema, suspicious pulmonary nodule/mass, pleural effusion, or pneumothorax. No acute bony abnormalities are identified.  IMPRESSION: Cardiomegaly and chronic peribronchial thickening - no evidence of acute cardiopulmonary disease.   Original Report Authenticated By: Lura Em, M.D.    Dg Foot  Complete Right  02/12/2012  *RADIOLOGY REPORT*  Clinical Data: Nonhealing wound overlying the right heel.  RIGHT FOOT COMPLETE - 3+ VIEW  Comparison: 01/24/2012  Findings: There is no evidence of fracture, subluxation, dislocation or osteomyelitis. Heel soft tissue swelling is noted.  A small to moderate calcaneal spur is present. Amputation of the great toe distal phalanx is again noted. Mild midfoot degenerative changes are present. Vascular calcifications are again identified.  IMPRESSION: Heel soft tissue swelling without acute bony abnormality or radiographic evidence of osteomyelitis.   Original Report Authenticated By: Lura Em, M.D.     Medications:  Scheduled:    . aspirin EC  81 mg Oral Daily  . cholecalciferol  1,000 Units Oral BID  . cilostazol  100 mg Oral Daily  . influenza  inactive virus vaccine  0.5 mL Intramuscular Tomorrow-1000  . insulin aspart  0-20 Units Subcutaneous TID WC  . insulin aspart  0-5 Units Subcutaneous QHS  . insulin aspart  10 Units Subcutaneous TID WC  . insulin detemir  15 Units Subcutaneous BID  . multivitamin with minerals  1 tablet Oral Daily  . omega-3 acid ethyl esters  1 g Oral BID  . piperacillin-tazobactam (ZOSYN)  IV  3.375 g Intravenous Q8H  . pregabalin  225 mg Oral QHS  . rOPINIRole  2 mg Oral QHS  .  simvastatin  40 mg Oral QPM  . sodium chloride  500 mL Intravenous Once  . sodium chloride  500 mL Intravenous Once  . sodium chloride  3 mL Intravenous Q12H  . sodium chloride      . vancomycin  1,000 mg Intravenous Q12H  . DISCONTD: lisinopril  5 mg Oral Daily  . DISCONTD: metoprolol succinate  50 mg Oral BID  . DISCONTD: vancomycin  1,000 mg Intravenous Q8H   Continuous:  [id number], gadobenate dimeglumine, HYDROcodone-acetaminophen, ondansetron (ZOFRAN) IV, ondansetron, ondansetron  Assessment: Principal Problem:  *Cellulitis and abscess of foot Active Problems:  Diabetes mellitus with neuropathy  OSA (obstructive sleep apnea)  Anemia  Hypertension  Morbid obesity  PVD (peripheral vascular disease)  Open wound of heel  Hematuria, microscopic  Hyponatremia  Osteomyelitis of right foot  Partial Achilles tendon tear    1. Right heel and osteomyelitis overlying the ulcer per MRI; cellulitis/?abscess of the right foot/heel.   The patientwas undergoing hyperbaric oxygen treatment and wound care by Dr. Nils Pyle in [place]. She was also evaluated by vascular surgeon Dr. Kellie Simmering on 02/08/2012. Per his assessment, she may have possible mild tibial occlusive disease but with excellent ABIs and excellent flow to the foot.  She had a temperature of 103 on admission and 104 at home. She is afebrile now following the start of antibiotics. Her white blood cell count has improved. Although she has improved clinically, a general surgery consultation is in order. It is likely, that the patient will need several weeks of IV antibiotics if surgical intervention is not the planned course of action. We'll continue Zosyn and vancomycin. Wound care consult noted and appreciated.   Radiographic evidence of right Achilles tendon tear. The patient has no clinical or symptomatic findings consistent with an Achilles tendon tear. Apparently, she was told this before, but she is actually  asymptomatic.  Pyuria/microscopic hematuria. The patient has no complaints of dysuria.  Uncontrolled type 2 diabetes mellitus with neuropathy and peripheral vascular disease. We'll continue Lantus and sliding-scale NovoLog. We'll make adjustments accordingly. will restart metformin. her hemoglobin A1c is 10.5, indicating poor outpatient control.  Anemia. Her hemoglobin is low. Her total iron is low at 11, ferritin is mildly elevated at 334, folate is 16, and vitamin B12 level is within normal limits at 689. The patient has iron deficiency anemia, but in the setting of osteomyelitis, we'll treat her conservatively with the multivitamins with low-dose iron.   Hypertension. The patient's blood pressure is on the low-normal side. This is likely secondary to hypovolemia. the dosing of metoprolol and lisinopril were decreased.   Hyponatremia, secondary to hypovolemia. Improving with IV fluids. We'll continue to be cautious of IV fluid hydration in the setting of a history of congestive heart failure (diastolic).  Chronic anxiety and restless leg syndrome. She is on alprazolam as needed and Lyrica and Requip at bedtime.     Plan:  1. Will consult general surgery for evaluation of right heel osteomyelitis. 2. Will restart metformin. 3. Continue supportive treatment.    LOS: 2 days   Adilene Areola 02/14/2012, 1:14 PM

## 2012-02-14 NOTE — Progress Notes (Signed)
INITIAL ADULT NUTRITION ASSESSMENT Date: 02/14/2012   Time: 11:36 AM Reason for Assessment: stage IV diabetic foot ulcer  INTERVENTION: Prostat TID.   ASSESSMENT: Female 66 y.o.  Dx: Cellulitis and abscess of foot  Hx:  Past Medical History  Diagnosis Date  . CHF (congestive heart failure)   . Diabetes mellitus   . Restless leg syndrome 07/25/2011  . OSA (obstructive sleep apnea) 07/25/2011  . Wound, open     right foot  . Diabetes mellitus with neuropathy 07/25/2011  . Cellulitis and abscess of foot 02/12/2012  . Osteomyelitis of right foot 02/14/2012  . Partial Achilles tendon tear 02/14/2012   Past Surgical History  Procedure Date  . Abdominal hysterectomy   . Achilles tendon repair   . Toe amputation     partial rt great toe   Related Meds:  Scheduled Meds:   . aspirin EC  81 mg Oral Daily  . cholecalciferol  1,000 Units Oral BID  . cilostazol  100 mg Oral Daily  . influenza  inactive virus vaccine  0.5 mL Intramuscular Tomorrow-1000  . insulin aspart  0-20 Units Subcutaneous TID WC  . insulin aspart  0-5 Units Subcutaneous QHS  . insulin aspart  10 Units Subcutaneous TID WC  . insulin detemir  15 Units Subcutaneous BID  . metFORMIN  1,000 mg Oral BID WC  . multivitamin with minerals  1 tablet Oral Daily  . omega-3 acid ethyl esters  1 g Oral BID  . piperacillin-tazobactam (ZOSYN)  IV  3.375 g Intravenous Q8H  . pregabalin  225 mg Oral QHS  . rOPINIRole  2 mg Oral QHS  . simvastatin  40 mg Oral QPM  . sodium chloride  500 mL Intravenous Once  . sodium chloride  500 mL Intravenous Once  . sodium chloride  3 mL Intravenous Q12H  . sodium chloride      . vancomycin  1,000 mg Intravenous Q12H  . DISCONTD: lisinopril  5 mg Oral Daily  . DISCONTD: metoprolol succinate  50 mg Oral BID  . DISCONTD: vancomycin  1,000 mg Intravenous Q8H   Continuous Infusions:  PRN Meds:.ALPRAZolam, gadobenate dimeglumine, HYDROcodone-acetaminophen, ondansetron (ZOFRAN) IV,  ondansetron, ondansetron  Ht: 5\' 10"  (177.8 cm)  Wt: 278 lb 14.1 oz (126.5 kg)  Ideal Wt: 68.5 kg  % Ideal Wt: 185%  Usual Wt: 299# % Usual Wt: 93% Wt Readings from Last 10 Encounters:  02/14/12 278 lb 14.1 oz (126.5 kg)  02/08/12 261 lb (118.389 kg)  02/06/12 261 lb (118.389 kg)  01/24/12 284 lb (128.822 kg)  01/15/12 290 lb (131.543 kg)  11/11/11 298 lb 4.5 oz (135.3 kg)  11/08/11 297 lb (134.718 kg)  08/04/11 299 lb (135.626 kg)  07/26/11 303 lb 5.7 oz (137.6 kg)   Body mass index is 40.02 kg/(m^2). Classified as extreme obesity, class III.   Food/Nutrition Related Hx: Pt occupied at time of visit. Pt with stage IV diabetic foot ulcer on rt foot. Wound is chronic and pt is being followed by [facility]. Wound Care RN evaluating pt.  Good PO intake; PO: 50-100%. Noted most recent Hgb A1cs (9.9-10.1). Poor glycemic control.   Labs:  CMP     Component Value Date/Time   NA 133* 02/14/2012 0502   K 4.5 02/14/2012 0502   CL 101 02/14/2012 0502   CO2 24 02/14/2012 0502   GLUCOSE 247* 02/14/2012 0502   BUN 32* 02/14/2012 0502   CREATININE 1.02 02/14/2012 0502   CALCIUM 8.2* 02/14/2012 0502  PROT 5.7* 02/13/2012 0450   ALBUMIN 2.1* 02/13/2012 0450   AST 12 02/13/2012 0450   ALT 10 02/13/2012 0450   ALKPHOS 89 02/13/2012 0450   BILITOT 0.3 02/13/2012 0450   GFRNONAA 56* 02/14/2012 0502   GFRAA 65* 02/14/2012 0502   Lab Results  Component Value Date   HGBA1C 10.5* 02/13/2012   HGBA1C 9.9* 07/25/2011   Lab Results  Component Value Date   CREATININE 1.02 02/14/2012     Intake:   Intake/Output Summary (Last 24 hours) at 02/14/12 1506 Last data filed at 02/14/12 0400  Gross per 24 hour  Intake   1240 ml  Output      0 ml  Net   1240 ml    Diet Order: Carb Control  Supplements/Tube Feeding: none at this time  IVF:    Estimated Nutritional Needs:   [id number] kcals daily Protein:158-189 grams protein daily Fluid:3.8-4.4 L fluid daily  NUTRITION  DIAGNOSIS: -Increased nutrient needs (NI-5.1).  Status: Ongoing  RELATED TO: wound healing  AS EVIDENCE BY: pt with stage IV foot ulcer.  MONITORING/EVALUATION(Goals): Weight, PO intake, labs, changes in status. 1) Pt will maintain current weight of 278# 2) Pt will meet >75% of estimated energy and protein needs  EDUCATION NEEDS: -Education not appropriate at this time  Dietitian #: [place] Per approved criteria  -Morbid Obesity    Joaquim Lai, RD, LDN 02/14/2012, 11:36 AM

## 2012-02-14 NOTE — Consult Note (Signed)
WOC consult Note Reason for Consult: eval heel ulcer.  Pt with several months hx of open heel wound. Has had care per [facility] wound care center, [facility] therapy and HBOT.  Came in with fever, just reviewed MRI which is positive for osteomyelitis and achilles tendon tear.  Discussed case with attending MD, she will consult general surgery for further recommendations for surgical intervention.   Wound type: Heel ulcer/DM Measurement: 4cm x 5cm x 2.5cm probe did touch calcaneous bone Wound bed: pink and 95% granulation tissue, does have track that goes to bone.  Some yellow slough aprox. 5% at 7 o'clock.   Drainage (amount, consistency, odor) minimal drainage on the chux under the heel.  Does have odor Periwound: hyperkeratotic skin circumferentially  Dressing procedure/placement/frequency: Will order normal saline dressings for now, surgery may decide to perform some type of bone debridement.  Discussed with bedside nurse and with attending MD.  Re consult if needed, will not follow at this time. Thanks  Paydon Carll Kellogg, [place] [phone])

## 2012-02-14 NOTE — Progress Notes (Signed)
ANTIBIOTIC CONSULT NOTE   Pharmacy Consult for Vancomycin and Zosyn Indication: cellulitis, wound, admitted with fever  No Known Allergies  Patient Measurements: Height: 5\' 10"  (177.8 cm) Weight: 278 lb 14.1 oz (126.5 kg) IBW/kg (Calculated) : 68.5   Vital Signs: Temp: 98.2 F (36.8 C) (09/16 0554) Temp src: Oral (09/16 0554) BP: 104/70 mmHg (09/16 0554) Pulse Rate: 76  (09/16 0554) Intake/Output from previous day: 09/15 0701 - 09/16 0700 In: 1720 [P.O.:720; IV Piggyback:1000] Out: -  Intake/Output from this shift:    Labs:  [facility] 02/14/12 0502 02/13/12 0450 02/12/12 1824  WBC 9.5 15.2* 13.8*  HGB 9.2* 8.1* 9.5*  PLT 298 250 263  LABCREA -- -- --  CREATININE 1.02 0.98 1.04   Estimated Creatinine Clearance: 78.5 ml/min (by C-G formula based on Cr of 1.02).  Basename 02/14/12 1037  VANCOTROUGH 27.2*  VANCOPEAK --  VANCORANDOM --  GENTTROUGH --  GENTPEAK --  GENTRANDOM --  TOBRATROUGH --  TOBRAPEAK --  TOBRARND --  AMIKACINPEAK --  AMIKACINTROU --  AMIKACIN --    Microbiology: Recent Results (from the past 720 hour(s))  CULTURE, BLOOD (ROUTINE X 2)     Status: Normal (Preliminary result)   Collection Time   02/12/12  6:24 PM      Component Value Range Status Comment   Specimen Description RIGHT ANTECUBITAL   Final    Special Requests BOTTLES DRAWN AEROBIC AND ANAEROBIC 4CC   Final    Culture     Final    Value: GRAM POSITIVE COCCI IN CHAINS     Gram Stain Report Called to,Read Back By and Verified With:     HUTSON,L @ [id number] ON 02/13/12 BY WOODIE,J     GS DONE @ [facility]   Report Status PENDING   Incomplete   CULTURE, BLOOD (ROUTINE X 2)     Status: Normal (Preliminary result)   Collection Time   02/12/12  6:30 PM      Component Value Range Status Comment   Specimen Description BLOOD LEFT HAND   Final    Special Requests BOTTLES DRAWN AEROBIC AND ANAEROBIC 6CC   Final    Culture NO GROWTH 2 DAYS   Final    Report Status PENDING   Incomplete    Medical  History: Past Medical History  Diagnosis Date  . CHF (congestive heart failure)   . Diabetes mellitus   . Restless leg syndrome 07/25/2011  . OSA (obstructive sleep apnea) 07/25/2011  . Wound, open     right foot  . Diabetes mellitus with neuropathy 07/25/2011  . Cellulitis and abscess of foot 02/12/2012   Medications:  Scheduled:     . aspirin EC  81 mg Oral Daily  . cholecalciferol  1,000 Units Oral BID  . cilostazol  100 mg Oral Daily  . influenza  inactive virus vaccine  0.5 mL Intramuscular Tomorrow-1000  . insulin aspart  0-20 Units Subcutaneous TID WC  . insulin aspart  0-5 Units Subcutaneous QHS  . insulin aspart  10 Units Subcutaneous TID WC  . insulin detemir  15 Units Subcutaneous BID  . multivitamin with minerals  1 tablet Oral Daily  . omega-3 acid ethyl esters  1 g Oral BID  . piperacillin-tazobactam (ZOSYN)  IV  3.375 g Intravenous Q8H  . pregabalin  225 mg Oral QHS  . pregabalin  50 mg Oral Once  . rOPINIRole  2 mg Oral QHS  . simvastatin  40 mg Oral QPM  . sodium chloride  500 mL Intravenous Once  . sodium chloride  500 mL Intravenous Once  . sodium chloride  3 mL Intravenous Q12H  . sodium chloride      . DISCONTD: lisinopril  5 mg Oral Daily  . DISCONTD: metoprolol succinate  50 mg Oral BID  . DISCONTD: vancomycin  1,000 mg Intravenous Q8H   Assessment: 66yo obese female admitted with LE cellulitis, fever, and would with drainage.  Now afebrile.  Good renal fxn. Estimated Creatinine Clearance: 78.5 ml/min (by C-G formula based on Cr of 1.02).  Trough level above goal.  Goal of Therapy:  Vancomycin trough level 10-15 mcg/ml Eradicate infection.  Plan: Zosyn 3.375gm iv q8hrs Reduce Vancomycin to 1gm iv q12hrs Check trough at steady state. Monitor labs, renal fxn, and cultures per protocol  Hart Robinsons A 02/14/2012,11:42 AM

## 2012-02-14 NOTE — Clinical Social Work Note (Signed)
CSW received referral but no comment regarding needs. CSW will sign off as no apparent needs at this time but can be reconsulted if needed.  Benay Pike, [place]

## 2012-02-14 NOTE — Progress Notes (Signed)
UR Chart Review Completed  

## 2012-02-14 NOTE — Progress Notes (Signed)
Pt's  IV site in right arm removed. The IV was leaking due to being pulled out of place by patient movement. The catheter was intact when removed. Attempted to start new IV on pt 1 unsuccessful attempt. Another RN is attempting a new site. 02/14/2012 [address] D

## 2012-02-15 ENCOUNTER — Inpatient Hospital Stay (HOSPITAL_COMMUNITY): Payer: Medicare Other

## 2012-02-15 ENCOUNTER — Encounter (HOSPITAL_COMMUNITY): Payer: Medicare Other

## 2012-02-15 DIAGNOSIS — R7881 Bacteremia: Secondary | ICD-10-CM

## 2012-02-15 DIAGNOSIS — R6510 Systemic inflammatory response syndrome (SIRS) of non-infectious origin without acute organ dysfunction: Secondary | ICD-10-CM | POA: Diagnosis present

## 2012-02-15 DIAGNOSIS — B955 Unspecified streptococcus as the cause of diseases classified elsewhere: Secondary | ICD-10-CM | POA: Diagnosis present

## 2012-02-15 LAB — GLUCOSE, CAPILLARY
Glucose-Capillary: 183 mg/dL — ABNORMAL HIGH (ref 70–99)
Glucose-Capillary: 261 mg/dL — ABNORMAL HIGH (ref 70–99)
Glucose-Capillary: 113 mg/dL — ABNORMAL HIGH (ref 70–99)
Glucose-Capillary: 65 mg/dL — ABNORMAL LOW (ref 70–99)
Glucose-Capillary: 81 mg/dL (ref 70–99)

## 2012-02-15 LAB — BASIC METABOLIC PANEL
CO2: 27 mEq/L (ref 19–32)
Chloride: 104 mEq/L (ref 96–112)
GFR calc non Af Amer: 67 mL/min — ABNORMAL LOW (ref >90)
Glucose, Bld: 223 mg/dL — ABNORMAL HIGH (ref 70–99)
Potassium: 4.4 mEq/L (ref 3.5–5.1)
Sodium: 138 mEq/L (ref 135–145)

## 2012-02-15 LAB — CBC
HCT: 26.7 % — ABNORMAL LOW (ref 36.0–46.0)
Hemoglobin: 8.8 g/dL — ABNORMAL LOW (ref 12.0–15.0)
MCH: 26 pg (ref 26.0–34.0)
MCV: 79 fL (ref 78.0–100.0)
RBC: 3.38 MIL/uL — ABNORMAL LOW (ref 3.87–5.11)

## 2012-02-15 LAB — URINE CULTURE: Colony Count: NO GROWTH

## 2012-02-15 MED ORDER — SODIUM CHLORIDE 0.9 % IJ SOLN: 10.0000 mL | INTRAMUSCULAR | Status: DC | PRN

## 2012-02-15 MED ORDER — METOPROLOL SUCCINATE ER 50 MG PO TB24
100.0000 mg | ORAL_TABLET | Freq: Every day | ORAL | Status: DC
  Administered 2012-02-15 – 2012-02-17 (×3): 100 mg via ORAL
  Filled 2012-02-15 (×2): qty 1
  Filled 2012-02-15 (×2): qty 2

## 2012-02-15 MED ORDER — SODIUM CHLORIDE 0.9 % IJ SOLN
10.0000 mL | Freq: Two times a day (BID) | INTRAMUSCULAR | Status: DC
  Administered 2012-02-15 (×2): 10 mL
  Administered 2012-02-16: 20 mL
  Administered 2012-02-16: 10 mL
  Administered 2012-02-17: 20 mL
  Filled 2012-02-15: qty 3
  Filled 2012-02-15: qty 6
  Filled 2012-02-15 (×2): qty 3
  Filled 2012-02-15: qty 6

## 2012-02-15 MED ORDER — LISINOPRIL 10 MG PO TABS
20.0000 mg | ORAL_TABLET | Freq: Every day | ORAL | Status: DC
  Administered 2012-02-15 – 2012-02-17 (×3): 20 mg via ORAL
  Filled 2012-02-15: qty 2
  Filled 2012-02-15: qty 1
  Filled 2012-02-15: qty 2
  Filled 2012-02-15: qty 1

## 2012-02-15 NOTE — Progress Notes (Addendum)
Subjective: The patient and her husband are sitting in the room. She is in no acute distress. She is waiting for the surgery consultation. She has no active pain, subjective fever or chills. She says that she feels well enough to go home.  Objective: Vital signs in last 24 hours: Filed Vitals:   02/14/12 0554 02/14/12 1408 02/14/12 2100 02/15/12 0500  BP: 104/70 134/67 152/79 153/82  Pulse: 76 80 84 86  Temp: 98.2 F (36.8 C) 97.3 F (36.3 C) 98.2 F (36.8 C) 98.1 F (36.7 C)  TempSrc: Oral Oral Oral Oral  Resp: 20 20 20 16   Height:      Weight: 126.5 kg (278 lb 14.1 oz)     SpO2: 95% 98% 96% 95%    Intake/Output Summary (Last 24 hours) at 02/15/12 1231 Last data filed at 02/15/12 0913  Gross per 24 hour  Intake   1480 ml  Output      0 ml  Net   1480 ml    Weight change:   Physical exam: General: Obese 66 year old Caucasian sitting up in bed, in no acute distress.  Lungs: Clear anteriorly with decreased breath sounds in the bases. Heart: S1, S2, with no murmurs rubs or gallops. Next line abdomen: Morbidly obese, positive bowel sounds, soft, nontender, nondistended. Extremities: Dressing currently on and not taken off. Exam from yesterday: No pedal/pretibial edema. Right foot heal with deep wound and mild  surrounding erythema. The wound is pink, but the  yellowish/grayish foul/malodorous drainage seen yesterday is no longer present. . Surrounding the wound is callousformation  and bogginess. Minimally tender upon palpation. Pedal pulses palpable.   Lab Results: Basic Metabolic Panel:  Basename 02/15/12 0529 02/14/12 0502  NA 138 133*  K 4.4 4.5  CL 104 101  CO2 27 24  GLUCOSE 223* 247*  BUN 22 32*  CREATININE 0.88 1.02  CALCIUM 8.6 8.2*  MG -- --  PHOS -- --   Liver Function Tests:  [facility] 02/13/12 0450 02/12/12 1824  AST 12 13  ALT 10 12  ALKPHOS 89 101  BILITOT 0.3 0.5  PROT 5.7* 6.5  ALBUMIN 2.1* 2.5*   No results found for this basename:  LIPASE:2,AMYLASE:2 in the last 72 hours No results found for this basename: AMMONIA:2 in the last 72 hours CBC:  Basename 02/15/12 0529 02/14/12 0502 02/12/12 1824  WBC 6.5 9.5 --  NEUTROABS -- -- 12.1*  HGB 8.8* 9.2* --  HCT 26.7* 27.5* --  MCV 79.0 79.9 --  PLT 299 298 --   Cardiac Enzymes:  Basename 02/13/12 0800  CKTOTAL 111  CKMB --  CKMBINDEX --  TROPONINI --   BNP: No results found for this basename: PROBNP:3 in the last 72 hours D-Dimer: No results found for this basename: DDIMER:2 in the last 72 hours CBG:  Basename 02/15/12 1116 02/15/12 0853 02/15/12 0739 02/14/12 2137 02/14/12 1655 02/14/12 1147  GLUCAP 247* 261* 183* 157* 224* 221*   Hemoglobin A1C:  Basename 02/13/12 0800  HGBA1C 10.5*   Fasting Lipid Panel: No results found for this basename: CHOL,HDL,LDLCALC,TRIG,CHOLHDL,LDLDIRECT in the last 72 hours Thyroid Function Tests:  Basename 02/13/12 0800  TSH 6.697*  T4TOTAL --  FREET4 0.94  T3FREE --  THYROIDAB --   Anemia Panel:  Basename 02/13/12 0800  VITAMINB12 689  FOLATE 16.0  FERRITIN 334*  TIBC 183*  IRON 11*  RETICCTPCT 0.9   Coagulation:  Basename 02/13/12 0450  LABPROT 16.4*  INR 1.30   Urine Drug Screen: Drugs  of Abuse  No results found for this basename: labopia,  cocainscrnur,  labbenz,  amphetmu,  thcu,  labbarb    Alcohol Level: No results found for this basename: ETH:2 in the last 72 hours Urinalysis:  Basename 02/12/12 1910  COLORURINE YELLOW  LABSPEC 1.020  PHURINE 5.5  GLUCOSEU >1000*  HGBUR LARGE*  BILIRUBINUR NEGATIVE  KETONESUR NEGATIVE  PROTEINUR NEGATIVE  UROBILINOGEN 0.2  NITRITE NEGATIVE  LEUKOCYTESUR NEGATIVE   Misc. Labs:   Micro: Recent Results (from the past 240 hour(s))  URINE CULTURE     Status: Normal   Collection Time   02/12/12  9:10 AM      Component Value Range Status Comment   Specimen Description URINE, CATHETERIZED   Final    Special Requests Normal   Final    Culture  Setup  Time 02/13/2012 22:16   Final    Colony Count NO GROWTH   Final    Culture NO GROWTH   Final    Report Status 02/15/2012 FINAL   Final   CULTURE, BLOOD (ROUTINE X 2)     Status: Normal (Preliminary result)   Collection Time   02/12/12  6:24 PM      Component Value Range Status Comment   Specimen Description BLOOD RIGHT ANTECUBITAL   Final    Special Requests BOTTLES DRAWN AEROBIC AND ANAEROBIC 4CC   Final    Culture  Setup Time 02/14/2012 04:20   Final    Culture     Final    Value: GRAM POSITIVE COCCI IN CHAINS     Note: Gram Stain Report Called to,Read Back By and Verified With: HUTSON  L AT 2310 02/13/12 BY Jeanmarie Plant J  Performed at [facility]   Report Status PENDING   Incomplete   CULTURE, BLOOD (ROUTINE X 2)     Status: Normal (Preliminary result)   Collection Time   02/12/12  6:30 PM      Component Value Range Status Comment   Specimen Description BLOOD LEFT HAND   Final    Special Requests BOTTLES DRAWN AEROBIC AND ANAEROBIC [facility]   Final    Culture  Setup Time 02/15/2012 01:20   Final    Culture     Final    Value: GRAM POSITIVE COCCI IN CHAINS     2310 Note: Gram Stain Report Called to,Read Back By and Verified With: HUTSON L 02/13/2012 WOODIE J   Report Status PENDING   Incomplete     Studies/Results: Mr Ankle Right W Wo Contrast  02/14/2012  *RADIOLOGY REPORT*  Clinical Data: Right ankle open wound.  Diabetic.  Osteomyelitis.  MRI OF THE RIGHT ANKLE WITHOUT AND WITH CONTRAST  Technique:  Multiplanar, multisequence MR imaging was performed both before and after administration of intravenous contrast.  Contrast: 56mL MULTIHANCE GADOBENATE DIMEGLUMINE 529 MG/ML IV SOLN  Comparison: 02/12/2012.  Findings: There is calcaneal osteomyelitis with bone marrow edema radiating through the calcaneal tuberosity.  Ulceration in the posterior heel extends up to the cortical surface with cortical osteolysis.  Large subtalar effusion may be reactive.  Septic arthritis is difficult to  exclude.  The Achilles tendon shows an old tear 6 cm from the calcaneal tuberosity with retraction measuring 3 cm. There is no complete tear.  Ankle joint effusion is present which is probably reactive.  Marrow edema is present in the talus which is probably reactive.  Reactive edema is present in the posterior distal leg.  IMPRESSION:  1.  Calcaneal tuberosity osteomyelitis.  Overlying ulcer.  No abscess identified. 2.  Possible septic arthritis of the posterior subtalar joint. Reactive edema in the talus could be associated with septic arthritis or reactive. 3.  Chronic Achilles tendon near complete tear.   Original Report Authenticated By: Dereck Ligas, M.D.    Dg Chest Port 1 View  02/15/2012  *RADIOLOGY REPORT*  Clinical Data: PICC line placement  PORTABLE CHEST - 1 VIEW  Comparison: Portable exam 1013 hours compared to 02/12/2012  Findings: Right arm PICC line, tip projects over SVC above cavoatrial junction. Enlargement of cardiac silhouette with slight pulmonary vascular congestion. Stable mediastinal contours. No acute failure or consolidation. Minimal chronic peribronchial thickening. Bones demineralized.  IMPRESSION: Tip of right arm PICC line projects over SVC. Enlargement of cardiac silhouette with pulmonary vascular congestion. Minimal bronchitic changes.   Original Report Authenticated By: Burnetta Sabin, M.D.     Medications:  Scheduled:    . aspirin EC  81 mg Oral Daily  . cholecalciferol  1,000 Units Oral BID  . cilostazol  100 mg Oral Daily  . feeding supplement  30 mL Oral TID BM  . insulin aspart  0-20 Units Subcutaneous TID WC  . insulin aspart  0-5 Units Subcutaneous QHS  . insulin aspart  10 Units Subcutaneous TID WC  . insulin detemir  15 Units Subcutaneous BID  . metFORMIN  1,000 mg Oral BID WC  . multivitamin with minerals  1 tablet Oral Daily  . omega-3 acid ethyl esters  1 g Oral BID  . piperacillin-tazobactam (ZOSYN)  IV  3.375 g Intravenous Q8H  . pregabalin  225  mg Oral QHS  . pregabalin  75 mg Oral Once  . rOPINIRole  2 mg Oral QHS  . simvastatin  40 mg Oral QPM  . sodium chloride  10-40 mL Intracatheter Q12H  . sodium chloride  3 mL Intravenous Q12H  . sodium chloride      . vancomycin  1,000 mg Intravenous Q12H   Continuous:  [id number], HYDROcodone-acetaminophen, ondansetron (ZOFRAN) IV, ondansetron, ondansetron, sodium chloride  Assessment: Principal Problem:  *Cellulitis and abscess of foot Active Problems:  Diabetes mellitus with neuropathy  OSA (obstructive sleep apnea)  Anemia  Hypertension  Morbid obesity  PVD (peripheral vascular disease)  Open wound of heel  Hematuria, microscopic  Hyponatremia  Osteomyelitis of right foot  Partial Achilles tendon tear  Bacteremia due to Gram-positive bacteria  Systemic inflammatory response syndrome (SIRS)    1. Right heel and osteomyelitis overlying the ulcer per MRI; cellulitis/?abscess of the right foot/heel.   The patientwas undergoing hyperbaric oxygen treatment and wound care by Dr. Nils Pyle in [place]. She was also evaluated by vascular surgeon Dr. Kellie Simmering on 02/08/2012. Per his assessment, she may have possible mild tibial occlusive disease but with excellent ABIs and excellent flow to the foot.  She had a temperature of 103 on admission and 104 at home. She is afebrile now following the start of antibiotics. Her white blood cell count has improved. Blood culture culture is now growing out gram-positive cocci in pairs. Although she has improved clinically, a general surgery consultation is in order. It is likely, that the patient will need several weeks of IV antibiotics. We'll continue Zosyn and vancomycin. Wound care consult noted and appreciated.   Gram-positive cocci bacteremia/SIRS, ikely secondary to osteomyelitis.  Radiographic evidence of right Achilles tendon tear. The patient has no clinical or symptomatic findings consistent with an Achilles tendon tear. Apparently, she was  told this before, but she is  actually asymptomatic.  Pyuria/microscopic hematuria. The patient has no complaints of dysuria.  Uncontrolled type 2 diabetes mellitus with neuropathy and peripheral vascular disease. We'll continue Lantus and sliding-scale NovoLog. We'll make adjustments accordingly. will restart metformin. her hemoglobin A1c is 10.5, indicating poor outpatient control.   Anemia. Her hemoglobin is low. Her total iron is low at 11, ferritin is mildly elevated at 334, folate is 16, and vitamin B12 level is within normal limits at 689. The patient has iron deficiency anemia, but in the setting of osteomyelitis, we'll treat her conservatively with the multivitamins with low-dose iron. She will need an outpatient elective GI consultation.  Hypertension. The patient's blood pressure was on the low-normal side, now increasing. This was likely secondary to hypovolemia. Will adjust dosing of metoprolol and lisinopril.   Hyponatremia, secondary to hypovolemia. Resolved with IV fluid hydration.  Chronic anxiety and restless leg syndrome. She is on alprazolam as needed and Lyrica and Requip at bedtime.     Plan:  1. Continue current management. 2. Await general surgery consultation. 3. We'll need to find out the speciation of the gram-positive cocci bacteremia. 4. We'll discuss further with infectious diseases physician at [facility]. 5. Outpatient GI consultation.   LOS: 3 days   Conna Terada 02/15/2012, 12:31 PM

## 2012-02-15 NOTE — Progress Notes (Signed)
Last night, when coming onto shift patient's IV had come out. Five nurses had stuck her multiple times with no success. Doctor was made aware and told nurses to keep trying because she really needed to get her antibiotics. The [facility] was able to come up and try and got it on the second time. IV antibiotics were started late. Please consider a PICC line.

## 2012-02-15 NOTE — Consult Note (Signed)
Reason for Consult: Osteomyelitis of the right calcaneal bone Referring Physician: Dr. Doretha Mary [name] is an 66 y.o. female.  HPI: Patient presented to [facility] after home health nurse recorded a febrile episode of 104F. She has had a ulceration on the right plantar surface of her heel for over a month. She has been seen by the wound clinic in EEG and for management. She is actually had several sessions of hyperbaric oxygen treatment. She is also been on a short course of oral antibiotics. She denies any fevers or chills other than the most recent event. She has not had any significant pain. She has not had any associated nausea. She has not been performing any specific wound care treatments at home other than dressing changes. She has had difficulty maintaining her blood glucose with typical home recordings between 200 and greater than 500. Blood glucose was noted be over 500 on her during her admission.  Past Medical History  Diagnosis Date  . CHF (congestive heart failure)   . Diabetes mellitus   . Restless leg syndrome 07/25/2011  . OSA (obstructive sleep apnea) 07/25/2011  . Wound, open     right foot  . Diabetes mellitus with neuropathy 07/25/2011  . Cellulitis and abscess of foot 02/12/2012  . Osteomyelitis of right foot 02/14/2012  . Partial Achilles tendon tear 02/14/2012    Past Surgical History  Procedure Date  . Abdominal hysterectomy   . Achilles tendon repair   . Toe amputation     partial rt great toe    Family History  Problem Relation Age of Onset  . Diabetes Sister     Social History:  reports that she has never smoked. She has never used smokeless tobacco. She reports that she does not drink alcohol or use illicit drugs.  Allergies: No Known Allergies  Medications:  I have reviewed the patient's current medications. Prior to Admission:  Prescriptions prior to admission  Medication Sig Dispense Refill  . ALPRAZolam (XANAX) 1 MG tablet Take 1  mg by mouth every 6 (six) hours as needed. Anxiety/Restless Leg      . aspirin EC 81 MG EC tablet Take 1 tablet (81 mg total) by mouth daily.  30 tablet  1  . cholecalciferol (VITAMIN D) 1000 UNITS tablet Take 1,000 Units by mouth 2 (two) times daily.      . cilostazol (PLETAL) 100 MG tablet Take 100 mg by mouth daily.      . ciprofloxacin (CIPRO) 500 MG tablet Take 500 mg by mouth 2 (two) times daily.      . ferrous sulfate 325 (65 FE) MG EC tablet Take 325 mg by mouth daily.       Marland Kitchen HYDROcodone-acetaminophen (NORCO/VICODIN) 5-325 MG per tablet Take 1 tablet by mouth every 4 (four) hours as needed for pain.  15 tablet  0  . lisinopril (PRINIVIL,ZESTRIL) 20 MG tablet Take 20 mg by mouth daily. Don't take this medicine until you have seen your physician on Tuesday.      . metFORMIN (GLUCOPHAGE) 1000 MG tablet Take 1,000 mg by mouth 2 (two) times daily with a meal.      . metoprolol succinate (TOPROL-XL) 100 MG 24 hr tablet Take 1 tablet (100 mg total) by mouth daily. Take with or immediately following a meal.  30 tablet  6  . Multiple Vitamin (MULTIVITAMIN WITH MINERALS) TABS Take 1 tablet by mouth daily.      Marland Kitchen omega-3 acid ethyl esters (LOVAZA) 1  G capsule Take 1 g by mouth 2 (two) times daily.      . ondansetron (ZOFRAN ODT) 8 MG disintegrating tablet Take 1 tablet (8 mg total) by mouth every 8 (eight) hours as needed for nausea.  10 tablet  0  . rOPINIRole (REQUIP) 2 MG tablet Take 2 mg by mouth at bedtime.      . simvastatin (ZOCOR) 40 MG tablet Take 40 mg by mouth every evening.      . traMADol (ULTRAM) 50 MG tablet Take 50 mg by mouth 4 (four) times daily as needed. FOR PAIN      . insulin detemir (LEVEMIR) 100 UNIT/ML injection Inject 23 Units into the skin daily.       . insulin lispro protamine-insulin lispro (HUMALOG 50/50) (50-50) 100 UNIT/ML SUSP Inject 60-90 Units into the skin 3 (three) times daily with meals. Sliding scale per MD      . pregabalin (LYRICA) 75 MG capsule Take 225 mg  by mouth at bedtime.       Scheduled:   . aspirin EC  81 mg Oral Daily  . cholecalciferol  1,000 Units Oral BID  . cilostazol  100 mg Oral Daily  . feeding supplement  30 mL Oral TID BM  . insulin aspart  0-20 Units Subcutaneous TID WC  . insulin aspart  0-5 Units Subcutaneous QHS  . insulin aspart  10 Units Subcutaneous TID WC  . insulin detemir  15 Units Subcutaneous BID  . lisinopril  20 mg Oral Daily  . metFORMIN  1,000 mg Oral BID WC  . metoprolol succinate  100 mg Oral Daily  . multivitamin with minerals  1 tablet Oral Daily  . omega-3 acid ethyl esters  1 g Oral BID  . piperacillin-tazobactam (ZOSYN)  IV  3.375 g Intravenous Q8H  . pregabalin  225 mg Oral QHS  . rOPINIRole  2 mg Oral QHS  . simvastatin  40 mg Oral QPM  . sodium chloride  10-40 mL Intracatheter Q12H  . sodium chloride  3 mL Intravenous Q12H  . vancomycin  1,000 mg Intravenous Q12H   Continuous:  [id number], HYDROcodone-acetaminophen, ondansetron (ZOFRAN) IV, ondansetron, ondansetron, sodium chloride  Results for orders placed during the hospital encounter of 02/12/12 (from the past 48 hour(s))  GLUCOSE, CAPILLARY     Status: Abnormal   Collection Time   02/13/12  9:35 PM      Component Value Range Comment   Glucose-Capillary 156 (*) 70 - 99 mg/dL   CBC     Status: Abnormal   Collection Time   02/14/12  5:02 AM      Component Value Range Comment   WBC 9.5  4.0 - 10.5 K/uL    RBC 3.44 (*) 3.87 - 5.11 MIL/uL    Hemoglobin 9.2 (*) 12.0 - 15.0 g/dL    HCT 27.5 (*) 36.0 - 46.0 %    MCV 79.9  78.0 - 100.0 fL    MCH 26.7  26.0 - 34.0 pg    MCHC 33.5  30.0 - 36.0 g/dL    RDW 15.6 (*) 11.5 - 15.5 %    Platelets 298  150 - 400 K/uL   BASIC METABOLIC PANEL     Status: Abnormal   Collection Time   02/14/12  5:02 AM      Component Value Range Comment   Sodium 133 (*) 135 - 145 mEq/L    Potassium 4.5  3.5 - 5.1 mEq/L DELTA CHECK NOTED   Chloride 101  96 - 112 mEq/L    CO2 24  19 - 32 mEq/L    Glucose,  Bld 247 (*) 70 - 99 mg/dL    BUN 32 (*) 6 - 23 mg/dL    Creatinine, Ser 1.02  0.50 - 1.10 mg/dL    Calcium 8.2 (*) 8.4 - 10.5 mg/dL    GFR calc non Af Amer 56 (*) >90 mL/min    GFR calc Af Amer 65 (*) >90 mL/min   GLUCOSE, CAPILLARY     Status: Abnormal   Collection Time   02/14/12  7:38 AM      Component Value Range Comment   Glucose-Capillary 210 (*) 70 - 99 mg/dL    Comment 1 Documented in Chart      Comment 2 Notify RN     [place], [place]     Status: Abnormal   Collection Time   02/14/12 10:37 AM      Component Value Range Comment   Vancomycin Tr 27.2 (*) 10.0 - 20.0 ug/mL   GLUCOSE, CAPILLARY     Status: Abnormal   Collection Time   02/14/12 11:47 AM      Component Value Range Comment   Glucose-Capillary 221 (*) 70 - 99 mg/dL    Comment 1 Documented in Chart      Comment 2 Notify RN     GLUCOSE, CAPILLARY     Status: Abnormal   Collection Time   02/14/12  4:55 PM      Component Value Range Comment   Glucose-Capillary 224 (*) 70 - 99 mg/dL    Comment 1 Documented in Chart      Comment 2 Notify RN     GLUCOSE, CAPILLARY     Status: Abnormal   Collection Time   02/14/12  9:37 PM      Component Value Range Comment   Glucose-Capillary 157 (*) 70 - 99 mg/dL    Comment 1 Notify RN      Comment 2 Documented in Chart     BASIC METABOLIC PANEL     Status: Abnormal   Collection Time   02/15/12  5:29 AM      Component Value Range Comment   Sodium 138  135 - 145 mEq/L    Potassium 4.4  3.5 - 5.1 mEq/L    Chloride 104  96 - 112 mEq/L    CO2 27  19 - 32 mEq/L    Glucose, Bld 223 (*) 70 - 99 mg/dL    BUN 22  6 - 23 mg/dL    Creatinine, Ser 0.88  0.50 - 1.10 mg/dL    Calcium 8.6  8.4 - 10.5 mg/dL    GFR calc non Af Amer 67 (*) >90 mL/min    GFR calc Af Amer 78 (*) >90 mL/min   CBC     Status: Abnormal   Collection Time   02/15/12  5:29 AM      Component Value Range Comment   WBC 6.5  4.0 - 10.5 K/uL    RBC 3.38 (*) 3.87 - 5.11 MIL/uL    Hemoglobin 8.8 (*) 12.0 - 15.0 g/dL      HCT 26.7 (*) 36.0 - 46.0 %    MCV 79.0  78.0 - 100.0 fL    MCH 26.0  26.0 - 34.0 pg    MCHC 33.0  30.0 - 36.0 g/dL    RDW 15.5  11.5 - 15.5 %    Platelets 299  150 - 400 K/uL  GLUCOSE, CAPILLARY     Status: Abnormal   Collection Time   02/15/12  7:39 AM      Component Value Range Comment   Glucose-Capillary 183 (*) 70 - 99 mg/dL    Comment 1 Notify RN     GLUCOSE, CAPILLARY     Status: Abnormal   Collection Time   02/15/12  8:53 AM      Component Value Range Comment   Glucose-Capillary 261 (*) 70 - 99 mg/dL   GLUCOSE, CAPILLARY     Status: Abnormal   Collection Time   02/15/12 11:16 AM      Component Value Range Comment   Glucose-Capillary 247 (*) 70 - 99 mg/dL    Comment 1 Notify RN     GLUCOSE, CAPILLARY     Status: Abnormal   Collection Time   02/15/12  4:36 PM      Component Value Range Comment   Glucose-Capillary 131 (*) 70 - 99 mg/dL    Comment 1 Notify RN      Comment 2 Documented in Chart       Mr Ankle Right W Wo Contrast  02/14/2012  *RADIOLOGY REPORT*  Clinical Data: Right ankle open wound.  Diabetic.  Osteomyelitis.  MRI OF THE RIGHT ANKLE WITHOUT AND WITH CONTRAST  Technique:  Multiplanar, multisequence MR imaging was performed both before and after administration of intravenous contrast.  Contrast: 70mL MULTIHANCE GADOBENATE DIMEGLUMINE 529 MG/ML IV SOLN  Comparison: 02/12/2012.  Findings: There is calcaneal osteomyelitis with bone marrow edema radiating through the calcaneal tuberosity.  Ulceration in the posterior heel extends up to the cortical surface with cortical osteolysis.  Large subtalar effusion may be reactive.  Septic arthritis is difficult to exclude.  The Achilles tendon shows an old tear 6 cm from the calcaneal tuberosity with retraction measuring 3 cm. There is no complete tear.  Ankle joint effusion is present which is probably reactive.  Marrow edema is present in the talus which is probably reactive.  Reactive edema is present in the posterior distal  leg.  IMPRESSION:  1.  Calcaneal tuberosity osteomyelitis.  Overlying ulcer.  No abscess identified. 2.  Possible septic arthritis of the posterior subtalar joint. Reactive edema in the talus could be associated with septic arthritis or reactive. 3.  Chronic Achilles tendon near complete tear.   Original Report Authenticated By: Mary [name], M.D.    Dg Chest Port 1 View  02/15/2012  *RADIOLOGY REPORT*  Clinical Data: PICC line placement  PORTABLE CHEST - 1 VIEW  Comparison: Portable exam 1013 hours compared to 02/12/2012  Findings: Right arm PICC line, tip projects over SVC above cavoatrial junction. Enlargement of cardiac silhouette with slight pulmonary vascular congestion. Stable mediastinal contours. No acute failure or consolidation. Minimal chronic peribronchial thickening. Bones demineralized.  IMPRESSION: Tip of right arm PICC line projects over SVC. Enlargement of cardiac silhouette with pulmonary vascular congestion. Minimal bronchitic changes.   Original Report Authenticated By: Mary [name], M.D.     Review of Systems  Constitutional: Positive for fever and chills. Negative for weight loss, malaise/fatigue and diaphoresis.  HENT: Negative.   Eyes: Negative.   Respiratory: Negative.   Cardiovascular: Negative.   Gastrointestinal: Positive for heartburn.  Genitourinary: Negative.   Musculoskeletal: Negative.   Skin: Negative.        As per history of present illness  Neurological: Negative.  Negative for weakness.  Endo/Heme/Allergies: Negative.    Blood pressure 155/70, pulse 86, temperature 97.8 F (36.6 [name]), temperature  source Oral, resp. rate 20, height 5\' 10"  (1.778 m), weight 126.5 kg (278 lb 14.1 oz), last menstrual period 07/25/2011, SpO2 97.00%. Physical Exam  Constitutional: She is oriented to person, place, and time. She appears well-developed and well-nourished.       Obese  HENT:  Head: Normocephalic and atraumatic.  Eyes: Conjunctivae normal are normal. Pupils  are equal, round, and reactive to light. No scleral icterus.  Neck: Normal range of motion. Neck supple. No tracheal deviation present. No thyromegaly present.  Cardiovascular: Normal rate, regular rhythm and normal heart sounds.   Respiratory: Effort normal and breath sounds normal. No respiratory distress.  GI: Soft. Bowel sounds are normal. She exhibits no distension. There is no tenderness.  Lymphadenopathy:    She has no cervical adenopathy.  Neurological: She is alert and oriented to person, place, and time.  Skin: Skin is warm and dry.       Right plantar surface of her calcaneus demonstrates a approximate 2.5 cm in diameter ulceration. There is exposed granulation tissue and a proximal area of tunneling. There is minimal serous discharge. There is a slight odor. There is no surrounding crepitance. There is no associated pain. There is no surrounding erythema. There is no streaking or erythema on the distal leg or remainder of foot.  Psychiatric:       Tearful    Assessment/Plan: Osteomyelitis of the right calcaneal bone. I had a long discussion with the patient regarding the findings. Overall the wound does look decent of them I am skeptical that she will respond to antibiotic treatment. At this time she does not demonstrate any evidence of being septic from this infectious process and I do feel a course of antibiotics is warranted. I did discuss with the patient indications for amputation including failure to respond to the antibiotics. We had a long conversation regarding the amputation and the patient's concerns regarding this. I tried to express to her that normal functionality and mobility is possible with rehabilitation even following a below the knee or potentially an above-the-knee indication. At this time patient would require below the knee amputation of the we will continue to monitor her her response to the IV antibiotics. She currently has a PICC line in place and once deemed  appropriate per the hospitalist service they be discharged to home with continued home antibiotic management. He'll continue to follow her course and hopeful improvement.  Mary [name] 02/15/2012, 7:03 PM

## 2012-02-15 NOTE — Progress Notes (Signed)
CBG was 65 and hypoglycemic protocol was implemented. Pt refused peanut butter and graham crackers, so she was given applesauce and grape juice. Will recheck CBG and continue to monitor.

## 2012-02-15 NOTE — Progress Notes (Signed)
Pt had another black stool. Unable to sample because sample was mixed with urine. No active bleeding. Will continue to assess and wait for sample. Mary [name]

## 2012-02-16 DIAGNOSIS — E11649 Type 2 diabetes mellitus with hypoglycemia without coma: Secondary | ICD-10-CM | POA: Diagnosis not present

## 2012-02-16 DIAGNOSIS — R6510 Systemic inflammatory response syndrome (SIRS) of non-infectious origin without acute organ dysfunction: Secondary | ICD-10-CM

## 2012-02-16 LAB — GLUCOSE, CAPILLARY
Glucose-Capillary: 165 mg/dL — ABNORMAL HIGH (ref 70–99)
Glucose-Capillary: 127 mg/dL — ABNORMAL HIGH (ref 70–99)

## 2012-02-16 MED ORDER — SODIUM CHLORIDE 0.9 % IJ SOLN
INTRAMUSCULAR | Status: AC
  Administered 2012-02-16: 10 mL
  Filled 2012-02-16: qty 3

## 2012-02-16 MED ORDER — PREGABALIN 75 MG PO CAPS
75.0000 mg | ORAL_CAPSULE | Freq: Every morning | ORAL | Status: DC
  Administered 2012-02-16 – 2012-02-17 (×2): 75 mg via ORAL
  Filled 2012-02-16 (×2): qty 1

## 2012-02-16 NOTE — Progress Notes (Signed)
Subjective: The patient and her husband are sitting in the room. She is in no acute distress. She is upset about the possibility that her foot could be amputated.  Objective: Vital signs in last 24 hours: Filed Vitals:   02/15/12 0500 02/15/12 1412 02/15/12 2055 02/16/12 0620  BP: 153/82 155/70 155/71 114/63  Pulse: 86 86 76 76  Temp: 98.1 F (36.7 C) 97.8 F (36.6 C) 97.7 F (36.5 C) 98.5 F (36.9 C)  TempSrc: Oral  Oral Oral  Resp: 16 20 20 18   Height:      Weight:      SpO2: 95% 97% 97% 95%    Intake/Output Summary (Last 24 hours) at 02/16/12 1037 Last data filed at 02/16/12 1024  Gross per 24 hour  Intake   1400 ml  Output    752 ml  Net    648 ml    Weight change:   Physical exam: General: Obese 66 year old Caucasian sitting up in bed, in no acute distress.  Lungs: Clear anteriorly with decreased breath sounds in the bases. Heart: S1, S2, with no murmurs rubs or gallops. Next line abdomen: Morbidly obese, positive bowel sounds, soft, nontender, nondistended. Extremities:: Right foot heel with deep wound and less surrounding erythema. The wound is pink, but the  yellowish/grayish foul/malodorous drainage seen yesterday is less . Surrounding the wound is callousformation  and bogginess. Minimally tender upon palpation. Pedal pulses palpable.   Lab Results: Basic Metabolic Panel:  Basename 02/15/12 0529 02/14/12 0502  NA 138 133*  K 4.4 4.5  CL 104 101  CO2 27 24  GLUCOSE 223* 247*  BUN 22 32*  CREATININE 0.88 1.02  CALCIUM 8.6 8.2*  MG -- --  PHOS -- --   Liver Function Tests: No results found for this basename: AST:2,ALT:2,ALKPHOS:2,BILITOT:2,PROT:2,ALBUMIN:2 in the last 72 hours No results found for this basename: LIPASE:2,AMYLASE:2 in the last 72 hours No results found for this basename: AMMONIA:2 in the last 72 hours CBC:  Basename 02/15/12 0529 02/14/12 0502  WBC 6.5 9.5  NEUTROABS -- --  HGB 8.8* 9.2*  HCT 26.7* 27.5*  MCV 79.0 79.9  PLT 299  298   Cardiac Enzymes: No results found for this basename: CKTOTAL:3,CKMB:3,CKMBINDEX:3,TROPONINI:3 in the last 72 hours BNP: No results found for this basename: PROBNP:3 in the last 72 hours D-Dimer: No results found for this basename: DDIMER:2 in the last 72 hours CBG:  Basename 02/16/12 0715 02/16/12 0317 02/15/12 2237 02/15/12 2129 02/15/12 2113 02/15/12 2045  GLUCAP 214* 157* 122* 113* 81 65*   Hemoglobin A1C: No results found for this basename: HGBA1C in the last 72 hours Fasting Lipid Panel: No results found for this basename: CHOL,HDL,LDLCALC,TRIG,CHOLHDL,LDLDIRECT in the last 72 hours Thyroid Function Tests: No results found for this basename: TSH,T4TOTAL,FREET4,T3FREE,THYROIDAB in the last 72 hours Anemia Panel: No results found for this basename: VITAMINB12,FOLATE,FERRITIN,TIBC,IRON,RETICCTPCT in the last 72 hours Coagulation: No results found for this basename: LABPROT:2,INR:2 in the last 72 hours Urine Drug Screen: Drugs of Abuse  No results found for this basename: labopia,  cocainscrnur,  labbenz,  amphetmu,  thcu,  labbarb    Alcohol Level: No results found for this basename: ETH:2 in the last 72 hours Urinalysis: No results found for this basename: COLORURINE:2,APPERANCEUR:2,LABSPEC:2,PHURINE:2,GLUCOSEU:2,HGBUR:2,BILIRUBINUR:2,KETONESUR:2,PROTEINUR:2,UROBILINOGEN:2,NITRITE:2,LEUKOCYTESUR:2 in the last 72 hours Misc. Labs:   Micro: Recent Results (from the past 240 hour(s))  URINE CULTURE     Status: Normal   Collection Time   02/12/12  9:10 AM      Component Value  Range Status Comment   Specimen Description URINE, CATHETERIZED   Final    Special Requests Normal   Final    Culture  Setup Time 02/13/2012 22:16   Final    Colony Count NO GROWTH   Final    Culture NO GROWTH   Final    Report Status 02/15/2012 FINAL   Final   CULTURE, BLOOD (ROUTINE X 2)     Status: Normal (Preliminary result)   Collection Time   02/12/12  6:24 PM      Component Value Range  Status Comment   Specimen Description BLOOD RIGHT ANTECUBITAL   Final    Special Requests BOTTLES DRAWN AEROBIC AND ANAEROBIC 4CC   Final    Culture  Setup Time 02/14/2012 04:20   Final    Culture     Final    Value: GRAM POSITIVE COCCI IN CHAINS     Note: Gram Stain Report Called to,Read Back By and Verified With: HUTSON  L AT 2310 02/13/12 BY Jeanmarie Plant J  Performed at [facility]   Report Status PENDING   Incomplete   CULTURE, BLOOD (ROUTINE X 2)     Status: Normal (Preliminary result)   Collection Time   02/12/12  6:30 PM      Component Value Range Status Comment   Specimen Description BLOOD LEFT HAND   Final    Special Requests BOTTLES DRAWN AEROBIC AND ANAEROBIC [facility]   Final    Culture  Setup Time 02/15/2012 01:20   Final    Culture     Final    Value: GRAM POSITIVE COCCI IN CHAINS     2310 Note: Gram Stain Report Called to,Read Back By and Verified With: HUTSON L 02/13/2012 WOODIE J   Report Status PENDING   Incomplete     Studies/Results: Dg Chest Port 1 View  02/15/2012  *RADIOLOGY REPORT*  Clinical Data: PICC line placement  PORTABLE CHEST - 1 VIEW  Comparison: Portable exam 1013 hours compared to 02/12/2012  Findings: Right arm PICC line, tip projects over SVC above cavoatrial junction. Enlargement of cardiac silhouette with slight pulmonary vascular congestion. Stable mediastinal contours. No acute failure or consolidation. Minimal chronic peribronchial thickening. Bones demineralized.  IMPRESSION: Tip of right arm PICC line projects over SVC. Enlargement of cardiac silhouette with pulmonary vascular congestion. Minimal bronchitic changes.   Original Report Authenticated By: Burnetta Sabin, M.D.     Medications:  Scheduled:    . aspirin EC  81 mg Oral Daily  . cholecalciferol  1,000 Units Oral BID  . cilostazol  100 mg Oral Daily  . feeding supplement  30 mL Oral TID BM  . insulin aspart  0-20 Units Subcutaneous TID WC  . insulin aspart  0-5 Units Subcutaneous QHS  .  insulin aspart  10 Units Subcutaneous TID WC  . insulin detemir  15 Units Subcutaneous BID  . lisinopril  20 mg Oral Daily  . metFORMIN  1,000 mg Oral BID WC  . metoprolol succinate  100 mg Oral Daily  . multivitamin with minerals  1 tablet Oral Daily  . omega-3 acid ethyl esters  1 g Oral BID  . piperacillin-tazobactam (ZOSYN)  IV  3.375 g Intravenous Q8H  . pregabalin  225 mg Oral QHS  . pregabalin  75 mg Oral q morning - 10a  . rOPINIRole  2 mg Oral QHS  . simvastatin  40 mg Oral QPM  . sodium chloride  10-40 mL Intracatheter Q12H  . vancomycin  1,000 mg Intravenous Q12H  . DISCONTD: sodium chloride  3 mL Intravenous Q12H   Continuous:  [id number], HYDROcodone-acetaminophen, ondansetron (ZOFRAN) IV, ondansetron, ondansetron, sodium chloride  Assessment: Principal Problem:  *Cellulitis and abscess of foot Active Problems:  Diabetes mellitus with neuropathy  OSA (obstructive sleep apnea)  Anemia  Hypertension  Morbid obesity  PVD (peripheral vascular disease)  Open wound of heel  Hematuria, microscopic  Hyponatremia  Osteomyelitis of right foot  Partial Achilles tendon tear  Bacteremia due to Gram-positive bacteria  Systemic inflammatory response syndrome (SIRS)  Hypoglycemia associated with diabetes    1. Right heel and osteomyelitis overlying the ulcer per MRI; cellulitis/?abscess of the right foot/heel. Dr. Theodosia Paling assessment noted and appreciated. I agree, it is likely that the patient would need an amputation, but she is willing to try IV antibiotics.  The patient was undergoing hyperbaric oxygen treatment and wound care by Dr. Nils Pyle in [place]. She was also evaluated by vascular surgeon Dr. Kellie Simmering on 02/08/2012. Per his assessment, she may have possible mild tibial occlusive disease but with excellent ABIs and excellent flow to the foot.  She had a temperature of 103 on admission and 104 at home. She is afebrile now following the start of antibiotics. Her white  blood cell count has improved. Blood culture culture is now growing out gram-positive cocci in pairs. She will need several weeks of IV antibiotics. We'll continue Zosyn and vancomycin. Wound care consult noted and appreciated.   Gram-positive cocci bacteremia/SIRS, ikely secondary to osteomyelitis. Once the identification of the bacteria is known, antibiotic therapy can be narrowed.  Radiographic evidence of right Achilles tendon tear. The patient has no clinical or symptomatic findings consistent with an Achilles tendon tear. Apparently, she was told this before, but she is actually asymptomatic.  Pyuria/microscopic hematuria. The patient has no complaints of dysuria.  Uncontrolled type 2 diabetes mellitus with neuropathy and peripheral vascular disease. We'll continue Lantus and sliding-scale NovoLog. We'll make adjustments accordingly. will restart metformin. her hemoglobin A1c is 10.5, indicating poor outpatient control.  Mild hypoglycemia last night. She was treated accordingly.   Anemia. Her hemoglobin is low. Her total iron is low at 11, ferritin is mildly elevated at 334, folate is 16, and vitamin B12 level is within normal limits at 689. The patient has iron deficiency anemia, but in the setting of osteomyelitis, we'll treat her conservatively with the multivitamins with low-dose iron. She will need an outpatient elective GI consultation.  Hypertension. The patient's blood pressure was on the low-normal side, but increased yesterday. She was restarted on her normal home doses of antihypertensive medications.  Hyponatremia, secondary to hypovolemia. Resolved with IV fluid hydration.  Chronic anxiety and restless leg syndrome. She is on alprazolam as needed and Lyrica and Requip at bedtime.   Status post right upper extremity PICC line.  Plan:  1. We'll continue current management. 2. Infectious diseases consultation once the speciation of the gram-positive cocci is resulted and  confirmed, then home with IV antibiotics, continued wound care by the wound clinic, and appropriate followup with Dr. Bernette Mayers.     LOS: 4 days   Gayl Ivanoff 02/16/2012, 10:37 AM

## 2012-02-16 NOTE — Progress Notes (Addendum)
ANTIBIOTIC CONSULT NOTE   Pharmacy Consult for Vancomycin and Zosyn Indication: osteomyelitis, wound, admitted with fever  No Known Allergies  Patient Measurements: Height: 5\' 10"  (177.8 cm) Weight: 278 lb 14.1 oz (126.5 kg) IBW/kg (Calculated) : 68.5   Vital Signs: Temp: 98.5 F (36.9 C) (09/18 0620) Temp src: Oral (09/18 0620) BP: 114/63 mmHg (09/18 0620) Pulse Rate: 76  (09/18 0620) Intake/Output from previous day: 09/17 0701 - 09/18 0700 In: 1480 [P.O.:1180; IV Piggyback:300] Out: 752 [Urine:751; Stool:1] Intake/Output from this shift: Total I/O In: 480 [P.O.:480] Out: -   Labs:  Basename 02/15/12 0529 02/14/12 0502  WBC 6.5 9.5  HGB 8.8* 9.2*  PLT 299 298  LABCREA -- --  CREATININE 0.88 1.02   Estimated Creatinine Clearance: 91 ml/min (by C-G formula based on Cr of 0.88).  Basename 02/14/12 1037  VANCOTROUGH 27.2*  VANCOPEAK --  Jake Michaelis --  GENTTROUGH --  GENTPEAK --  GENTRANDOM --  TOBRATROUGH --  TOBRAPEAK --  TOBRARND --  AMIKACINPEAK --  AMIKACINTROU --  AMIKACIN --    Microbiology: Recent Results (from the past 720 hour(s))  URINE CULTURE     Status: Normal   Collection Time   02/12/12  9:10 AM      Component Value Range Status Comment   Specimen Description URINE, CATHETERIZED   Final    Special Requests Normal   Final    Culture  Setup Time 02/13/2012 22:16   Final    Colony Count NO GROWTH   Final    Culture NO GROWTH   Final    Report Status 02/15/2012 FINAL   Final   CULTURE, BLOOD (ROUTINE X 2)     Status: Normal (Preliminary result)   Collection Time   02/12/12  6:24 PM      Component Value Range Status Comment   Specimen Description BLOOD RIGHT ANTECUBITAL   Final    Special Requests BOTTLES DRAWN AEROBIC AND ANAEROBIC 4CC   Final    Culture  Setup Time 02/14/2012 04:20   Final    Culture     Final    Value: GRAM POSITIVE COCCI IN CHAINS     Note: Gram Stain Report Called to,Read Back By and Verified With: HUTSON  L AT 2310  02/13/12 BY Jeanmarie Plant J  Performed at [facility]   Report Status PENDING   Incomplete   CULTURE, BLOOD (ROUTINE X 2)     Status: Normal (Preliminary result)   Collection Time   02/12/12  6:30 PM      Component Value Range Status Comment   Specimen Description BLOOD LEFT HAND   Final    Special Requests BOTTLES DRAWN AEROBIC AND ANAEROBIC [facility]   Final    Culture  Setup Time 02/15/2012 01:20   Final    Culture     Final    Value: GRAM POSITIVE COCCI IN CHAINS     2310 Note: Gram Stain Report Called to,Read Back By and Verified With: HUTSON L 02/13/2012 WOODIE J   Report Status PENDING   Incomplete    Medical History: Past Medical History  Diagnosis Date  . CHF (congestive heart failure)   . Diabetes mellitus   . Restless leg syndrome 07/25/2011  . OSA (obstructive sleep apnea) 07/25/2011  . Wound, open     right foot  . Diabetes mellitus with neuropathy 07/25/2011  . Cellulitis and abscess of foot 02/12/2012  . Osteomyelitis of right foot 02/14/2012  . Partial Achilles tendon tear 02/14/2012  Medications:  Scheduled:     . aspirin EC  81 mg Oral Daily  . cholecalciferol  1,000 Units Oral BID  . cilostazol  100 mg Oral Daily  . feeding supplement  30 mL Oral TID BM  . insulin aspart  0-20 Units Subcutaneous TID WC  . insulin aspart  0-5 Units Subcutaneous QHS  . insulin aspart  10 Units Subcutaneous TID WC  . insulin detemir  15 Units Subcutaneous BID  . lisinopril  20 mg Oral Daily  . metFORMIN  1,000 mg Oral BID WC  . metoprolol succinate  100 mg Oral Daily  . multivitamin with minerals  1 tablet Oral Daily  . omega-3 acid ethyl esters  1 g Oral BID  . piperacillin-tazobactam (ZOSYN)  IV  3.375 g Intravenous Q8H  . pregabalin  225 mg Oral QHS  . rOPINIRole  2 mg Oral QHS  . simvastatin  40 mg Oral QPM  . sodium chloride  10-40 mL Intracatheter Q12H  . vancomycin  1,000 mg Intravenous Q12H  . DISCONTD: sodium chloride  3 mL Intravenous Q12H   Assessment: 65yo obese  female admitted with LE cellulitis, fever, and would with drainage.  Now afebrile.  Good renal fxn. Estimated Creatinine Clearance: 91 ml/min (by C-G formula based on Cr of 0.88).  Trough level above goal and Vancomycin adjusted.  Goal of Therapy:  Vancomycin trough level 15-20 Eradicate infection.  Plan: Zosyn 3.375gm iv q8hrs Vancomycin to 1gm iv q12hrs F/U  trough Friday am Monitor labs, renal fxn, and cultures per protocol  Hart Robinsons A 02/16/2012,10:21 AM

## 2012-02-16 NOTE — Progress Notes (Signed)
Subjective: No significant change. No fevers chills. No pain.  Objective: Vital signs in last 24 hours: Temp:  [97.7 F (36.5 C)-98.5 F (36.9 C)] 98.5 F (36.9 C) (09/18 0620) Pulse Rate:  [76-86] 76  (09/18 0620) Resp:  [18-20] 18  (09/18 0620) BP: (114-155)/(63-71) 114/63 mmHg (09/18 0620) SpO2:  [95 %-97 %] 95 % (09/18 0620) Last BM Date: 02/15/12  Intake/Output from previous day: 09/17 0701 - 09/18 0700 In: 1480 [P.O.:1180; IV Piggyback:300] Out: 752 [Urine:751; Stool:1] Intake/Output this shift: Total I/O In: 500 [P.O.:480; I.V.:20] Out: -   General appearance: alert and no distress Extremities: Rate large terminate is dressed with Kerlix gauze. This is taken down. There is still some serous discharge from the wound. The calcaneal ulcer has some granulation tissue. There is no significant odor.  Lab Results:   Basename 02/15/12 0529 02/14/12 0502  WBC 6.5 9.5  HGB 8.8* 9.2*  HCT 26.7* 27.5*  PLT 299 298   BMET  Basename 02/15/12 0529 02/14/12 0502  NA 138 133*  K 4.4 4.5  CL 104 101  CO2 27 24  GLUCOSE 223* 247*  BUN 22 32*  CREATININE 0.88 1.02  CALCIUM 8.6 8.2*   PT/INR No results found for this basename: LABPROT:2,INR:2 in the last 72 hours ABG No results found for this basename: PHART:2,PCO2:2,PO2:2,HCO3:2 in the last 72 hours  Studies/Results: Dg Chest Port 1 View  02/15/2012  *RADIOLOGY REPORT*  Clinical Data: PICC line placement  PORTABLE CHEST - 1 VIEW  Comparison: Portable exam 1013 hours compared to 02/12/2012  Findings: Right arm PICC line, tip projects over SVC above cavoatrial junction. Enlargement of cardiac silhouette with slight pulmonary vascular congestion. Stable mediastinal contours. No acute failure or consolidation. Minimal chronic peribronchial thickening. Bones demineralized.  IMPRESSION: Tip of right arm PICC line projects over SVC. Enlargement of cardiac silhouette with pulmonary vascular congestion. Minimal bronchitic changes.    Original Report Authenticated By: Burnetta Sabin, M.D.     Anti-infectives: Anti-infectives     Start     Dose/Rate Route Frequency Ordered Stop   02/14/12 2200   vancomycin (VANCOCIN) IVPB 1000 mg/200 mL premix        1,000 mg 200 mL/hr over 60 Minutes Intravenous Every 12 hours 02/14/12 1143     02/13/12 0400   piperacillin-tazobactam (ZOSYN) IVPB 3.375 g        3.375 g 12.5 mL/hr over 240 Minutes Intravenous Every 8 hours 02/12/12 2215     02/12/12 1945   vancomycin (VANCOCIN) IVPB 1000 mg/200 mL premix        1,000 mg 200 mL/hr over 60 Minutes Intravenous  Once 02/12/12 1940 02/12/12 2130   02/12/12 1945   piperacillin-tazobactam (ZOSYN) IVPB 3.375 g        3.375 g 12.5 mL/hr over 240 Minutes Intravenous  Once 02/12/12 1940 02/12/12 2031   02/12/12 0400   vancomycin (VANCOCIN) IVPB 1000 mg/200 mL premix  Status:  Discontinued        1,000 mg 200 mL/hr over 60 Minutes Intravenous Every 8 hours 02/12/12 2215 02/14/12 1140          Assessment/Plan: s/p * No surgery found * Osteomyelitis of the right calcaneal bone. At this time plans are for discharge per the hospitalist service pending the blood culture results. She will be continued on IV antibiotics as an outpatient for hopeful response otherwise surgical amputation will need to be addressed. Patient will followup with me as an outpatient.  LOS: 4 days  Javonda Suh C 02/16/2012

## 2012-02-16 NOTE — Progress Notes (Signed)
Inpatient Diabetes Program Recommendations  AACE/ADA: New Consensus Statement on Inpatient Glycemic Control (2013)  Target Ranges:  Prepandial:   less than 140 mg/dL      Peak postprandial:   less than 180 mg/dL (1-2 hours)      Critically ill patients:  140 - 180 mg/dL   Reason for Visit: Fasting and post-prandial hyperlgycemia  Inpatient Diabetes Program Recommendations Insulin - Basal: Pt takes basal insulin of Levemir 23 units and basal from 50/50 of 30 - 45 units tidwc. Please increase Levemir to 20-25 units bid at this point.  Note: Thank you, Rosita Kea, RN, CNS, Diabetes Coordinator [phone])

## 2012-02-16 NOTE — Progress Notes (Signed)
02/16/12 1527 Late entry for this morning about 0845. Patient c/o "feeling funny in chest, i can feel my picc line in here" pointing to right side of chest. States has had picc line before and didn't feel anything. Notified PICC nurse, Petra Kuba, RN this morning, stated maybe palpitation or possible vessel spasm, confirmed with her that right upper arm picc is in correct place per CXR. Stated was in correct position. Notified Dr Caryn Section of patient complaints, stated would see patient. Pt also c/o RLS pain to bilateral legs, stated takes Lyrica 75 mg tablet every morning and then 3 tablets every evening to help prevent RLS pain. Notified Dr Caryn Section, orders in place. No further c/o chest discomfort/strange feelings since this morning. Nursing to monitor. Cindie Crumbly

## 2012-02-16 NOTE — Progress Notes (Signed)
UR Chart Review Completed  

## 2012-02-16 NOTE — Progress Notes (Signed)
02/16/12 1739 Patient expressed concerns regarding inability to afford IV antibiotics needed at discharge. States informed per home health agency that cost could be around $96 per day, patient tearful and worried about cost. Requested nursing notify dr Risk manager. Text-paged Dr Caryn Section to notify of concerns, discussed with patient that nursing will discuss with case management when they return tomorrow as well. Stated "thank you, i appreciate that so much", support provided. Nursing to monitor. Cindie Crumbly

## 2012-02-17 DIAGNOSIS — F419 Anxiety disorder, unspecified: Secondary | ICD-10-CM | POA: Diagnosis present

## 2012-02-17 DIAGNOSIS — A491 Streptococcal infection, unspecified site: Secondary | ICD-10-CM

## 2012-02-17 LAB — CULTURE, BLOOD (ROUTINE X 2)

## 2012-02-17 MED ORDER — HEPARIN SOD (PORK) LOCK FLUSH 100 UNIT/ML IV SOLN
250.0000 [IU] | INTRAVENOUS | Status: AC | PRN
  Administered 2012-02-17: 250 [IU]
  Filled 2012-02-17: qty 5

## 2012-02-17 MED ORDER — INSULIN DETEMIR 100 UNIT/ML ~~LOC~~ SOLN: 26.0000 [IU] | Freq: Every day | SUBCUTANEOUS | Status: DC

## 2012-02-17 MED ORDER — PREGABALIN 75 MG PO CAPS: 75.0000 mg | ORAL_CAPSULE | Freq: Every morning | ORAL | Status: DC

## 2012-02-17 MED ORDER — INSULIN ASPART 100 UNIT/ML ~~LOC~~ SOLN: 10.0000 [IU] | Freq: Three times a day (TID) | SUBCUTANEOUS | Status: DC

## 2012-02-17 MED ORDER — DEXTROSE 5 % IV SOLN: 1.0000 g | INTRAVENOUS | Status: DC

## 2012-02-17 MED ORDER — SODIUM CHLORIDE 0.9 % IJ SOLN
INTRAMUSCULAR | Status: AC
  Administered 2012-02-17: 20 mL
  Filled 2012-02-17: qty 3

## 2012-02-17 NOTE — Progress Notes (Signed)
02/17/12 1214 Patient c/o "chest pressure" to right side of chest this morning about 1130. Stated "its my picc line, i can feel it". Denies chest pain or shortness of breath. Pt anxious earlier and given xanax as ordered PRN for anxiety. Notified Dr Conley Canal of patient concerns regarding picc line. Stated would see patient today. Also, notified patient has SCDs for VTE prophylaxis but has been refusing to wear d/t leg discomfort with RLS. Nursing to monitor. Cindie Crumbly

## 2012-02-17 NOTE — Discharge Summary (Signed)
Physician Discharge Summary  Patient ID: Mary [name] MRN: [id number] DOB/AGE: 1945/11/29 66 y.o.  Admit date: 02/12/2012 Discharge date: 02/17/2012  Discharge Diagnoses:  Principal Problem:  *Cellulitis and abscess of foot Active Problems:  Anxiety  Diabetes mellitus with neuropathy  OSA (obstructive sleep apnea)  Restless leg syndrome  Anemia  Hypertension  Morbid obesity  PVD (peripheral vascular disease)  Open wound of heel  Hematuria, microscopic  Hyponatremia  Osteomyelitis of right foot  Bacteremia due to microaerophilic Streptococcus  Systemic inflammatory response syndrome (SIRS)  Hypoglycemia associated with diabetes Abnormal TSH, needs repeat TSH  2-3 months.    Medication List     As of 02/17/2012  1:42 PM    STOP taking these medications         ciprofloxacin 500 MG tablet   Commonly known as: CIPRO      HYDROcodone-acetaminophen 5-325 MG per tablet   Commonly known as: NORCO/VICODIN      insulin lispro protamine-insulin lispro (50-50) 100 UNIT/ML Susp   Commonly known as: HUMALOG 50/50      ondansetron 8 MG disintegrating tablet   Commonly known as: ZOFRAN-ODT      TAKE these medications         ALPRAZolam 1 MG tablet   Commonly known as: XANAX   Take 1 mg by mouth every 6 (six) hours as needed. Anxiety/Restless Leg      aspirin 81 MG EC tablet   Take 1 tablet (81 mg total) by mouth daily.      cholecalciferol 1000 UNITS tablet   Commonly known as: VITAMIN D   Take 1,000 Units by mouth 2 (two) times daily.      cilostazol 100 MG tablet   Commonly known as: PLETAL   Take 100 mg by mouth daily.      dextrose 5 % SOLN 50 mL with cefTRIAXone 1 G SOLR 1 g   Inject 1 g into the vein daily. Until Mar 10, 2012      ferrous sulfate 325 (65 FE) MG EC tablet   Take 325 mg by mouth daily.      insulin aspart 100 UNIT/ML injection   Commonly known as: novoLOG   Inject 10 Units into the skin 3 (three) times daily with meals.      insulin  detemir 100 UNIT/ML injection   Commonly known as: LEVEMIR   Inject 26 Units into the skin daily.      lisinopril 20 MG tablet   Commonly known as: PRINIVIL,ZESTRIL   Take 20 mg by mouth daily. Don't take this medicine until you have seen your physician on Tuesday.      metFORMIN 1000 MG tablet   Commonly known as: GLUCOPHAGE   Take 1,000 mg by mouth 2 (two) times daily with a meal.      metoprolol succinate 100 MG 24 hr tablet   Commonly known as: TOPROL-XL   Take 1 tablet (100 mg total) by mouth daily. Take with or immediately following a meal.      multivitamin with minerals Tabs   Take 1 tablet by mouth daily.      omega-3 acid ethyl esters 1 G capsule   Commonly known as: LOVAZA   Take 1 g by mouth 2 (two) times daily.      pregabalin 75 MG capsule   Commonly known as: LYRICA   Take 225 mg by mouth at bedtime.      pregabalin 75 MG capsule   Commonly known as: LYRICA  Take 1 capsule (75 mg total) by mouth every morning.      rOPINIRole 2 MG tablet   Commonly known as: REQUIP   Take 2 mg by mouth at bedtime.      simvastatin 40 MG tablet   Commonly known as: ZOCOR   Take 40 mg by mouth every evening.      traMADol 50 MG tablet   Commonly known as: ULTRAM   Take 50 mg by mouth 4 (four) times daily as needed. FOR PAIN            Discharge Orders    Future Orders Please Complete By Expires   Diet Carb Modified      Activity as tolerated - No restrictions      Discharge instructions      Comments:   Keep pressure off heel wound      Follow-up Information    Follow up with Donato Heinz, MD. On 02/24/2012. (Follow-up with Dr Geroge Baseman 02/24/12 at 2:45 pm)    Contact information:   [place] [id number] [phone]          Disposition: 01-Home or Self Care with home health RN  Discharged Condition: stable  Consults: Treatment Team:  Donato Heinz, MD  Labs:    Sodium   130 133 138      Potassium   3.7 4.5  4.4       Chloride   99 101 104      CO2   24 24 27       Mean Plasma Glucose    255       BUN   33 32 22      Creatinine, Ser   0.98 1.02 0.88      Calcium   7.9 8.2 8.6      GFR calc non Af Amer   59 56 67      GFR calc Af Amer   68  65  78       Glucose, Bld   370 247 223      Alkaline Phosphatase   89        Albumin   2.1        AST   12        ALT   10        Total Protein   5.7        Total Bilirubin   0.3         CARDIAC PROFILE    Total CK    111        IRON /ANEMIA PROFILE    Iron    11       UIBC    172       TIBC    183       Saturation Ratios    6       Ferritin    334       Folate    5.4 ng/mL"16.0  5.4 ng/mL" border=0 src="file:///C:/PROGRAM%20FILES%20([email or web address]/V7.8/EN-US/Images/IP_COMMENT_EXIST.gif" width=5 height=10        OTHER CHEM    Vitamin B-12    689        CBC    WBC   15.2 9.5 6.5      RBC   3.10 3.44 3.38      Hemoglobin   8.1 9.2 8.8      HCT   24.4 27.5 26.7      MCV   78.7  79.9 79.0      MCH   26.1 26.7 26.0      MCHC   33.2 33.5 33.0      RDW   15.6 15.6 15.5      Platelets   250 298 299       DIFFERENTIAL    Neutrophils Relative   88        Lymphocytes Relative   5        Monocytes Relative   7        Eosinophils Relative   0        Basophils Relative   0        Neutro Abs   12.1        Lymphs Abs   0.7        Monocytes Absolute   0.9        Eosinophils Absolute   0.0        Basophils Absolute   0.0        RBC.    3.47       Retic Ct Pct    0.9       Retic Count, Manual    31.2        OTHER HEMATOLOGY    Sed Rate    80        PROTIME W/ INR    Prothrombin Time   16.4        INR   1.30         ANTIBIOTICS    Vancomycin Tr     27.2        DIABETES    Hemoglobin A1C    =6.5% Diagnostic of Diabetes Mellitus (if abnormal result is confirmed) 5.7-6.4% Increased risk of developing Diabetes Mellitus References:Diagnosis and Classification of Diabetes Mellitus,Diabetes [id number] 1):S62-S69 and Standards of Medical  Care in ..."10.5 =6.5% Diagnostic of Diabetes Mellitus (if abnormal result is confirmed) 5.7-6.4% Increased risk of developing Diabetes Mellitus References:Diagnosis and Classification of Diabetes Mellitus,Diabetes [id number] 1):S62-S69 and Standards of Medical Care in ..." border=0 src="file:///C:/PROGRAM%20FILES%20([email or web address]/[email or web address]/EN-US/Images/IP_COMMENT_EXIST.gif" width=5 height=10       Glucose, Bld   370 247 223       THYROID    TSH    6.697       Free T4    0.94        URINALYSIS    Color, Urine   YELLOW        APPearance   HAZY        Specific Gravity, Urine   1.020        pH   5.5        Glucose, UA   1000 mg/dL">1000        Bilirubin Urine   NEGATIVE        Ketones, ur   NEGATIVE        Protein, ur   NEGATIVE        Urobilinogen, UA   0.2        Nitrite   NEGATIVE        Leukocytes, UA   NEGATIVE        Hgb urine dipstick   LARGE        WBC, UA   7-10        RBC / HPF   11-20        Squamous Epithelial / LPF   FEW        Bacteria, UA   MANY  Casts   GRANULAR CAST        One out of two blood cultures growing microaerophilic steptococcus  Diagnostics:  Ct Abdomen Pelvis W Contrast  02/06/2012  *RADIOLOGY REPORT*  Clinical Data: Left lower quadrant abdominal pain.  CT ABDOMEN AND PELVIS WITH CONTRAST  Technique:  Multidetector CT imaging of the abdomen and pelvis was performed following the standard protocol during bolus administration of intravenous contrast.  Contrast: 166mL OMNIPAQUE IOHEXOL 300 MG/ML  SOLN  Comparison: No priors.  Findings:  Lung Bases: Small hiatal hernia.  Otherwise, unremarkable.  Abdomen/Pelvis:  Contrast bolus is suboptimal, limiting sensitivity for detection of visceral vascular abnormalities.  With these limitations in mind, the appearance of the liver, gallbladder, pancreas, spleen, bilateral adrenal glands and bilateral kidneys is unremarkable.  Status post total abdominal hysterectomy.  Bilateral ovaries are unremarkable in  appearance.  No ascites or pneumoperitoneum, and no pathologic distension of bowel.  Normal appendix.  No definite pathologic lymphadenopathy identified within the abdomen or pelvis.  There is mild atherosclerosis of the abdominal and pelvic vasculature, without definite aneurysm.  Musculoskeletal: There are no aggressive appearing lytic or blastic lesions noted in the visualized portions of the skeleton.  IMPRESSION: 1.  No acute abnormality within the abdomen or pelvis to account for the patient's symptoms. 2.  Specifically, the appendix is normal. Additionally, there is no evidence of significant colonic diverticula. 3.  Small hiatal hernia. 4.  Mild atherosclerosis. 5.  Status post total abdominal hysterectomy.   Original Report Authenticated By: Etheleen Mayhew, M.D.    Mr Ankle Right W Wo Contrast  02/14/2012  *RADIOLOGY REPORT*  Clinical Data: Right ankle open wound.  Diabetic.  Osteomyelitis.  MRI OF THE RIGHT ANKLE WITHOUT AND WITH CONTRAST  Technique:  Multiplanar, multisequence MR imaging was performed both before and after administration of intravenous contrast.  Contrast: 32mL MULTIHANCE GADOBENATE DIMEGLUMINE 529 MG/ML IV SOLN  Comparison: 02/12/2012.  Findings: There is calcaneal osteomyelitis with bone marrow edema radiating through the calcaneal tuberosity.  Ulceration in the posterior heel extends up to the cortical surface with cortical osteolysis.  Large subtalar effusion may be reactive.  Septic arthritis is difficult to exclude.  The Achilles tendon shows an old tear 6 cm from the calcaneal tuberosity with retraction measuring 3 cm. There is no complete tear.  Ankle joint effusion is present which is probably reactive.  Marrow edema is present in the talus which is probably reactive.  Reactive edema is present in the posterior distal leg.  IMPRESSION:  1.  Calcaneal tuberosity osteomyelitis.  Overlying ulcer.  No abscess identified. 2.  Possible septic arthritis of the posterior subtalar  joint. Reactive edema in the talus could be associated with septic arthritis or reactive. 3.  Chronic Achilles tendon near complete tear.   Original Report Authenticated By: Dereck Ligas, M.D.    Dg Chest Port 1 View  02/15/2012  *RADIOLOGY REPORT*  Clinical Data: PICC line placement  PORTABLE CHEST - 1 VIEW  Comparison: Portable exam 1013 hours compared to 02/12/2012  Findings: Right arm PICC line, tip projects over SVC above cavoatrial junction. Enlargement of cardiac silhouette with slight pulmonary vascular congestion. Stable mediastinal contours. No acute failure or consolidation. Minimal chronic peribronchial thickening. Bones demineralized.  IMPRESSION: Tip of right arm PICC line projects over SVC. Enlargement of cardiac silhouette with pulmonary vascular congestion. Minimal bronchitic changes.   Original Report Authenticated By: Burnetta Sabin, M.D.    Dg Chest Portable 1 View  02/12/2012  *RADIOLOGY  REPORT*  Clinical Data: 66 year old female with fever.  PORTABLE CHEST - 1 VIEW  Comparison: 01/15/2012 and prior chest radiographs  Findings: Mild cardiomegaly is again noted. Peribronchial thickening is unchanged. Elevation of the right hemidiaphragm is also again identified. There is no evidence of focal airspace disease, pulmonary edema, suspicious pulmonary nodule/mass, pleural effusion, or pneumothorax. No acute bony abnormalities are identified.  IMPRESSION: Cardiomegaly and chronic peribronchial thickening - no evidence of acute cardiopulmonary disease.   Original Report Authenticated By: Lura Em, M.D.    Dg Foot 2 Views Right  01/24/2012  *RADIOLOGY REPORT*  Clinical Data: 66 year old female with diabetic ulcer on the bottom of the right foot.  RIGHT FOOT - 2 VIEW  Comparison: Calcaneus views 01/12/2012.  Findings: Dressing material overlying the right heel.  Increased soft tissue defect since the comparison.  The mineralization of the calcaneus underlying this level is stable.   Degenerative spurring again noted.  Previous amputation of the distal phalanx of the first toe.  No definite osteolysis or acute fracture or dislocation.  Degenerative changes again noted at the talonavicular articulation.  IMPRESSION: Soft tissue defect overlying the calcaneus.  No plain radiographic evidence of acute osteomyelitis.   Original Report Authenticated By: Randall An, M.D.    Dg Foot Complete Right  02/12/2012  *RADIOLOGY REPORT*  Clinical Data: Nonhealing wound overlying the right heel.  RIGHT FOOT COMPLETE - 3+ VIEW  Comparison: 01/24/2012  Findings: There is no evidence of fracture, subluxation, dislocation or osteomyelitis. Heel soft tissue swelling is noted.  A small to moderate calcaneal spur is present. Amputation of the great toe distal phalanx is again noted. Mild midfoot degenerative changes are present. Vascular calcifications are again identified.  IMPRESSION: Heel soft tissue swelling without acute bony abnormality or radiographic evidence of osteomyelitis.   Original Report Authenticated By: Lura Em, M.D.    Ct Orbitss W/o Cm  01/24/2012  *RADIOLOGY REPORT*  Clinical Data: Right orbital pain and swelling status post fall.  CT ORBITS WITHOUT CONTRAST  Technique:  Multidetector CT imaging of the orbits was performed following the standard protocol without intravenous contrast.  Comparison: None.  Findings: There is moderate preorbital soft tissue swelling on the right.  No post septal hematoma is identified.  There is no evidence of globe injury or lens displacement.  The optic nerves and extraocular muscles appear normal.  There is no evidence of orbital or other upper facial fracture. There are no paranasal sinus air-fluid levels.  There is mucosal thickening inferiorly in the right maxillary sinus.  There is apparent periodontal disease involving the left maxillary canine (tooth 11), incompletely imaged.  Some of the right maxillary teeth are absent.  IMPRESSION:  1.   Moderate preseptal soft tissue swelling in the right orbit.  No evidence of post septal hematoma or globe injury. 2.  No evidence of orbital fracture. 3.  Right maxillary sinus mucosal thickening and suspected periodontal disease.   Original Report Authenticated By: Vivia Ewing, M.D.     Procedures: PICC line  Hospital Course: See H&P for complete admission details. The patient is a 66 year old white female diabetic who presented to the emergency room with fever and erythema around her chronic heel wound. She had been getting hyperbaric oxygen therapy, oral ciprofloxacin, and a wound VAC to her chronic heel wound. On exam the wound was noted to be erythematous and malodorous. MRI showed osteomyelitis of the calcaneus. Surgery was consulted. Discussions regarding BKA were had with the patient. She  wishes to try limb salvage. One out of 2 blood cultures grew microaerophilic streptococcus. I discussed the case with infectious disease, Dr. Johnnye Sima. He recommends a month's worth of ceftriaxone. Patient has a PICC line. She has been very anxious here and is stable for discharge. She will followup with Dr. Bernette Mayers as an outpatient and voices understanding that she still is at risk for requiring amputation. Her TSH was elevated, with a normal free T4. She will need to have this repeated in a few months. Total time on the day of discharge greater than 30 minutes.  Discharge Exam:  Blood pressure 131/70, pulse 71, temperature 98 F (36.7 C), temperature source Oral, resp. rate 20, height 5\' 10"  (1.778 m), weight 123.6 kg (272 lb 7.8 oz), last menstrual period 07/25/2011, SpO2 96.00%.  General: Anxious and tearful, requesting discharge Lungs clear to auscultation bilaterally without wheezes rhonchi or rales Cardiovascular regular rate rhythm without murmurs gallops rubs Abdomen soft nontender nondistended Extremities: Right heel wound without odor or drainage or cellulitis  Signed: Jdyn Parkerson  L 02/17/2012, 1:42 PM

## 2012-02-17 NOTE — Progress Notes (Signed)
ANTIBIOTIC CONSULT NOTE   Pharmacy Consult for Vancomycin and Zosyn Indication: osteomyelitis, wound, admitted with fever  No Known Allergies  Patient Measurements: Height: 5\' 10"  (177.8 cm) Weight: 272 lb 7.8 oz (123.6 kg) IBW/kg (Calculated) : 68.5   Vital Signs: Temp: 98 F (36.7 C) (09/19 [id number]) Temp src: Oral (09/19 0633) BP: 131/70 mmHg (09/19 [id number]) Pulse Rate: 71  (09/19 0633) Intake/Output from previous day: 09/18 0701 - 09/19 0700 In: 1660 [P.O.:1080; I.V.:30; IV Piggyback:550] Out: 800 [Urine:800] Intake/Output from this shift:    Labs:  [facility] 02/15/12 0529  WBC 6.5  HGB 8.8*  PLT 299  LABCREA --  CREATININE 0.88   Estimated Creatinine Clearance: 89.8 ml/min (by C-G formula based on Cr of 0.88).  Basename 02/14/12 1037  VANCOTROUGH 27.2*  VANCOPEAK --  Jake Michaelis --  GENTTROUGH --  GENTPEAK --  GENTRANDOM --  TOBRATROUGH --  TOBRAPEAK --  TOBRARND --  AMIKACINPEAK --  AMIKACINTROU --  AMIKACIN --    Microbiology: Recent Results (from the past 720 hour(s))  URINE CULTURE     Status: Normal   Collection Time   02/12/12  9:10 AM      Component Value Range Status Comment   Specimen Description URINE, CATHETERIZED   Final    Special Requests Normal   Final    Culture  Setup Time 02/13/2012 22:16   Final    Colony Count NO GROWTH   Final    Culture NO GROWTH   Final    Report Status 02/15/2012 FINAL   Final   CULTURE, BLOOD (ROUTINE X 2)     Status: Normal (Preliminary result)   Collection Time   02/12/12  6:24 PM      Component Value Range Status Comment   Specimen Description BLOOD RIGHT ANTECUBITAL   Final    Special Requests BOTTLES DRAWN AEROBIC AND ANAEROBIC 4CC   Final    Culture  Setup Time 02/14/2012 04:20   Final    Culture     Final    Value: GRAM POSITIVE COCCI IN CHAINS     Note: Gram Stain Report Called to,Read Back By and Verified With: HUTSON  L AT 2310 02/13/12 BY Jeanmarie Plant J  Performed at [facility]   Report Status  PENDING   Incomplete   CULTURE, BLOOD (ROUTINE X 2)     Status: Normal (Preliminary result)   Collection Time   02/12/12  6:30 PM      Component Value Range Status Comment   Specimen Description BLOOD LEFT HAND   Final    Special Requests BOTTLES DRAWN AEROBIC AND ANAEROBIC [facility]   Final    Culture  Setup Time 02/15/2012 01:20   Final    Culture     Final    Value: GRAM POSITIVE COCCI IN CHAINS     2310 Note: Gram Stain Report Called to,Read Back By and Verified With: HUTSON L 02/13/2012 WOODIE J   Report Status PENDING   Incomplete    Medical History: Past Medical History  Diagnosis Date  . CHF (congestive heart failure)   . Diabetes mellitus   . Restless leg syndrome 07/25/2011  . OSA (obstructive sleep apnea) 07/25/2011  . Wound, open     right foot  . Diabetes mellitus with neuropathy 07/25/2011  . Cellulitis and abscess of foot 02/12/2012  . Osteomyelitis of right foot 02/14/2012  . Partial Achilles tendon tear 02/14/2012   Medications:  Scheduled:     . aspirin EC  81 mg Oral Daily  . cholecalciferol  1,000 Units Oral BID  . cilostazol  100 mg Oral Daily  . feeding supplement  30 mL Oral TID BM  . insulin aspart  0-20 Units Subcutaneous TID WC  . insulin aspart  0-5 Units Subcutaneous QHS  . insulin aspart  10 Units Subcutaneous TID WC  . insulin detemir  15 Units Subcutaneous BID  . lisinopril  20 mg Oral Daily  . metFORMIN  1,000 mg Oral BID WC  . metoprolol succinate  100 mg Oral Daily  . multivitamin with minerals  1 tablet Oral Daily  . omega-3 acid ethyl esters  1 g Oral BID  . piperacillin-tazobactam (ZOSYN)  IV  3.375 g Intravenous Q8H  . pregabalin  225 mg Oral QHS  . pregabalin  75 mg Oral q morning - 10a  . rOPINIRole  2 mg Oral QHS  . simvastatin  40 mg Oral QPM  . sodium chloride  10-40 mL Intracatheter Q12H  . sodium chloride      . vancomycin  1,000 mg Intravenous Q12H  . DISCONTD: sodium chloride  3 mL Intravenous Q12H   Assessment: 66yo obese  female admitted with LE cellulitis, fever, and would with drainage.  Now afebrile.  Good renal fxn. Estimated Creatinine Clearance: 89.8 ml/min (by C-G formula based on Cr of 0.88).  Trough level above goal and Vancomycin adjusted.  Goal of Therapy:  Vancomycin trough level 15-20 Eradicate infection.  Plan: Zosyn 3.375gm iv q8hrs Vancomycin to 1gm iv q12hrs F/U  trough Friday am Monitor labs, renal fxn, and cultures per protocol  Hart Robinsons A 02/17/2012,8:50 AM

## 2012-02-17 NOTE — Progress Notes (Signed)
02/17/12 1823 Late entry for 1530 this afternoon. Pt discharged home with husband. [facility] health set up per [facility], RN to follow-up with patient tomorrow per [facility] Basta, [facility]. PICC line flushed with 10 ml saline and heparin flush per protocol prior to discharge. Dressing dry and intact, pt and husband verbalize care of picc line. Pt to receive rocephin IV tomorrow per home health, no dose needed before discharge per MD. Reviewed instructions with patient/husband, given copy of instructions, med list, f/u appointment information. Verbalized understanding, expressed concerns regarding ability to afford novolog prescription, husband disucssed with dr Conley Canal as well. Pt to f/u with primary physician with further concerns/questions. Pt states appointment with Dr Juel Burrow nurse soon.  No c/o pain or anxiety at discharge. Right heel dressing dry and intact prior to discharge. Pt left floor in stable condition via w/c accompanied by nurse tech. Cindie Crumbly

## 2012-02-21 LAB — GLUCOSE, CAPILLARY

## 2012-04-18 ENCOUNTER — Encounter: Payer: Self-pay | Admitting: Adult Health

## 2012-04-18 ENCOUNTER — Ambulatory Visit (INDEPENDENT_AMBULATORY_CARE_PROVIDER_SITE_OTHER): Payer: Medicare Other | Admitting: Adult Health

## 2012-04-18 VITALS — BP 110/54 | HR 97 | Ht 70.0 in | Wt 253.0 lb

## 2012-04-18 DIAGNOSIS — R000 Tachycardia, unspecified: Secondary | ICD-10-CM

## 2012-04-18 DIAGNOSIS — I10 Essential (primary) hypertension: Secondary | ICD-10-CM

## 2012-04-18 MED ORDER — METOPROLOL SUCCINATE ER 100 MG PO TB24: 100.0000 mg | ORAL_TABLET | Freq: Every day | ORAL | Status: DC

## 2012-04-18 NOTE — Progress Notes (Signed)
HPI: Mary [name] is a 66 year old obese female patient of [name] that we follow for ongoing assessment and treatment of SVT, and diastolic CHF. Patient has multiple medical issues to include peripheral arterial disease, occult control diabetes, frequent foot ulcers for which she is being treated for wound clinic, anxiety depression, and hypercholesterolemia. She comes today for six-month followup with a mother boot on her right foot for a nonhealing heel ulcer. She has lost 20 pounds since the last visit dropping from 272-253 pounds. She states that she is just not hungry and does not eat very much anymore. She is on able to tolerate eggs beef products, and eats very small meals at home. She denies presyncope, dizziness, she does have some pectoral pain as she pushes on both arms to get up out of a chair which has become more prominent for her. She is followed by Dr. Bertell Maria call for ongoing labs and evaluation of diabetes and hypercholesterolemia. Most recent labs were completed September 15th 2013. She continues to be mildly anemic, some evidence of hyponatremia on that lab draw. She is followed by [facility] for home evaluations and ongoing case management.  No Known Allergies  Current Outpatient Prescriptions  Medication Sig Dispense Refill  . ALPRAZolam (XANAX) 1 MG tablet Take 1 mg by mouth every 6 (six) hours as needed. Anxiety/Restless Leg      . aspirin EC 81 MG EC tablet Take 1 tablet (81 mg total) by mouth daily.  30 tablet  1  . cholecalciferol (VITAMIN D) 1000 UNITS tablet Take 1,000 Units by mouth 2 (two) times daily.      . cilostazol (PLETAL) 100 MG tablet Take 100 mg by mouth daily.      . cyclobenzaprine (FLEXERIL) 5 MG tablet Take 5 mg by mouth 2 (two) times daily as needed.      . ferrous sulfate 325 (65 FE) MG EC tablet Take 325 mg by mouth daily.       Marland Kitchen gabapentin (NEURONTIN) 300 MG capsule Take 300 mg by mouth 3 (three) times daily. 1 capsule in AM, 1 capsule at Noon, and 2  capsules in PM      . insulin lispro protamine-insulin lispro (HUMALOG 50/50) (50-50) 100 UNIT/ML SUSP Inject 20-40 Units into the skin 3 (three) times daily. 40 units in AM 20 units at [facility] and 30 units in PM      . metFORMIN (GLUCOPHAGE) 1000 MG tablet Take 1,000 mg by mouth 2 (two) times daily with a meal.      . metoprolol succinate (TOPROL-XL) 100 MG 24 hr tablet Take 1 tablet (100 mg total) by mouth daily. Take with or immediately following a meal.  30 tablet  6  . Multiple Vitamin (MULTIVITAMIN WITH MINERALS) TABS Take 1 tablet by mouth daily.      Marland Kitchen omega-3 acid ethyl esters (LOVAZA) 1 G capsule Take 1 g by mouth daily.       Marland Kitchen rOPINIRole (REQUIP) 2 MG tablet Take 2 mg by mouth at bedtime.      . traMADol (ULTRAM) 50 MG tablet Take 50 mg by mouth 4 (four) times daily as needed. FOR PAIN      . traZODone (DESYREL) 50 MG tablet Take 50-100 mg by mouth at bedtime.        Past Medical History  Diagnosis Date  . CHF (congestive heart failure)   . Diabetes mellitus   . Restless leg syndrome 07/25/2011  . OSA (obstructive sleep apnea) 07/25/2011  .  Wound, open     right foot  . Diabetes mellitus with neuropathy 07/25/2011  . Cellulitis and abscess of foot 02/12/2012  . Osteomyelitis of right foot 02/14/2012  . Partial Achilles tendon tear 02/14/2012    Past Surgical History  Procedure Date  . Abdominal hysterectomy   . Achilles tendon repair   . Toe amputation     partial rt great toe    [id number] of systems complete and found to be negative unless listed above  PHYSICAL EXAM BP 110/54  Pulse 97  Ht 5\' 10"  (1.778 m)  Wt 253 lb (114.76 kg)  BMI 36.30 kg/m2  SpO2 98%  LMP 07/25/2011  General: Well developed, well nourished, in no acute distress, obese Head: Eyes PERRLA, No xanthomas.   Normal cephalic and atramatic  Lungs: Clear bilaterally to auscultation and percussion. Heart: HRRR S1 S2, without MRG.  Pulses are 2+ & equal radially.            No carotid bruit. No JVD.   No abdominal bruits. No femoral bruits. Abdomen: Bowel sounds are positive, abdomen soft and non-tender without masses or                  Hernia's noted. Msk:  Back normal,slow unsteady gait, uses rolling walker for ambulation support. Normal strength and tone for age. Extremities: No clubbing, cyanosis or edema of the left leg. Right foot is wrapped in thick ace wrap and she is wearing  Walking boot.  DP +1on the left. Neuro: Alert and oriented X 3. Psych:  Good affect, responds appropriately    ASSESSMENT AND PLAN

## 2012-04-18 NOTE — Assessment & Plan Note (Signed)
Heart rate is well controlled on metoprolol 100mg  Daily. Will continue this dose for now with refill provided.  I have cautioned her concerning BP. If she continues to lose weight, may need to adjust her dose to avoid hypotension, leading to falls or fractures.  She is followed by [facility] case manager who also checks her BP.

## 2012-04-18 NOTE — Progress Notes (Deleted)
Name: Danial Flippin    DOB: 01/10/1946  Age: 66 y.o.  MR#: [id number]       PCP:  Wende Neighbors, MD      Insurance: @PAYORNAME @   CC:   No chief complaint on file.   VS BP 110/54  Pulse 97  Ht 5\' 10"  (1.778 m)  Wt 253 lb (114.76 kg)  BMI 36.30 kg/m2  SpO2 98%  LMP 07/25/2011  Weights Current Weight  04/18/12 253 lb (114.76 kg)  02/16/12 272 lb 7.8 oz (123.6 kg)  02/08/12 261 lb (118.389 kg)    Blood Pressure  BP Readings from Last 3 Encounters:  04/18/12 110/54  02/17/12 131/70  02/08/12 159/82     Admit date:  (Not on file) Last encounter with RMR:  11/08/2011   Allergy No Known Allergies  Current Outpatient Prescriptions  Medication Sig Dispense Refill  . ALPRAZolam (XANAX) 1 MG tablet Take 1 mg by mouth every 6 (six) hours as needed. Anxiety/Restless Leg      . aspirin EC 81 MG EC tablet Take 1 tablet (81 mg total) by mouth daily.  30 tablet  1  . cholecalciferol (VITAMIN D) 1000 UNITS tablet Take 1,000 Units by mouth 2 (two) times daily.      . cilostazol (PLETAL) 100 MG tablet Take 100 mg by mouth daily.      . cyclobenzaprine (FLEXERIL) 5 MG tablet Take 5 mg by mouth 2 (two) times daily as needed.      . ferrous sulfate 325 (65 FE) MG EC tablet Take 325 mg by mouth daily.       Marland Kitchen gabapentin (NEURONTIN) 300 MG capsule Take 300 mg by mouth 3 (three) times daily. 1 capsule in AM, 1 capsule at Noon, and 2 capsules in PM      . insulin lispro protamine-insulin lispro (HUMALOG 50/50) (50-50) 100 UNIT/ML SUSP Inject 20-40 Units into the skin 3 (three) times daily. 40 units in AM 20 units at [facility] and 30 units in PM      . metFORMIN (GLUCOPHAGE) 1000 MG tablet Take 1,000 mg by mouth 2 (two) times daily with a meal.      . metoprolol succinate (TOPROL-XL) 100 MG 24 hr tablet Take 1 tablet (100 mg total) by mouth daily. Take with or immediately following a meal.  30 tablet  6  . Multiple Vitamin (MULTIVITAMIN WITH MINERALS) TABS Take 1 tablet by mouth daily.      Marland Kitchen omega-3 acid  ethyl esters (LOVAZA) 1 G capsule Take 1 g by mouth daily.       Marland Kitchen rOPINIRole (REQUIP) 2 MG tablet Take 2 mg by mouth at bedtime.      . traMADol (ULTRAM) 50 MG tablet Take 50 mg by mouth 4 (four) times daily as needed. FOR PAIN      . traZODone (DESYREL) 50 MG tablet Take 50-100 mg by mouth at bedtime.        Discontinued Meds:    Medications Discontinued During This Encounter  Medication Reason  . insulin detemir (LEVEMIR) 100 UNIT/ML injection Error  . lisinopril (PRINIVIL,ZESTRIL) 20 MG tablet Error  . dextrose 5 % SOLN 50 mL with cefTRIAXone 1 G SOLR 1 g Error  . insulin aspart (NOVOLOG) 100 UNIT/ML injection Error  . pregabalin (LYRICA) 75 MG capsule Error  . pregabalin (LYRICA) 75 MG capsule Error  . simvastatin (ZOCOR) 40 MG tablet Error    Patient Active Problem List  Diagnosis  . Acute exacerbation of CHF (congestive heart  failure)  . Diabetes mellitus with neuropathy  . OSA (obstructive sleep apnea)  . Acute bronchitis  . Generalized weakness  . Acute respiratory failure  . Restless leg syndrome  . Anemia  . Tachycardia  . Hyperkalemia  . Hyperlipidemia  . Hypertension  . Morbid obesity  . PVD (peripheral vascular disease)  . Open wound of heel  . Cellulitis and abscess of foot  . Hematuria, microscopic  . Hyponatremia  . Osteomyelitis of right foot  . Bacteremia due to Streptococcus  . Systemic inflammatory response syndrome (SIRS)  . Hypoglycemia associated with diabetes  . Anxiety    LABS Admission on 02/12/2012, Discharged on 02/17/2012  No results displayed because visit has over 200 results.    Admission on 02/06/2012, Discharged on 02/06/2012  Component Date Value  . WBC 02/06/2012 6.9   . RBC 02/06/2012 3.82*  . Hemoglobin 02/06/2012 10.0*  . HCT 02/06/2012 30.4*  . MCV 02/06/2012 79.6   . [facility] 02/06/2012 26.2   . MCHC 02/06/2012 32.9   . RDW 02/06/2012 15.8*  . Platelets 02/06/2012 386   . Neutrophils Relative 02/06/2012 53   . Neutro  Abs 02/06/2012 3.7   . Lymphocytes Relative 02/06/2012 32   . Lymphs Abs 02/06/2012 2.2   . Monocytes Relative 02/06/2012 12   . Monocytes Absolute 02/06/2012 0.8   . Eosinophils Relative 02/06/2012 3   . Eosinophils Absolute 02/06/2012 0.2   . Basophils Relative 02/06/2012 0   . Basophils Absolute 02/06/2012 0.0   . Sodium 02/06/2012 140   . Potassium 02/06/2012 4.3   . Chloride 02/06/2012 106   . CO2 02/06/2012 25   . Glucose, Bld 02/06/2012 39*  . BUN 02/06/2012 29*  . Creatinine, Ser 02/06/2012 0.94   . Calcium 02/06/2012 8.8   . GFR calc non Af Amer 02/06/2012 62*  . GFR calc Af Amer 02/06/2012 72*  . Color, Urine 02/06/2012 YELLOW   . APPearance 02/06/2012 CLEAR   . Specific Gravity, Urine 02/06/2012 1.010   . pH 02/06/2012 5.5   . Glucose, UA 02/06/2012 NEGATIVE   . Hgb urine dipstick 02/06/2012 SMALL*  . Bilirubin Urine 02/06/2012 NEGATIVE   . Ketones, ur 02/06/2012 NEGATIVE   . Protein, ur 02/06/2012 NEGATIVE   . Urobilinogen, UA 02/06/2012 0.2   . Nitrite 02/06/2012 NEGATIVE   . Leukocytes, UA 02/06/2012 NEGATIVE   . Lactic Acid, Venous 02/06/2012 1.8   . Glucose-Capillary 02/06/2012 35*  . Glucose-Capillary 02/06/2012 48*  . Glucose-Capillary 02/06/2012 163*  . Squamous Epithelial / LPF 02/06/2012 FEW*  . WBC, UA 02/06/2012 0-2   . RBC / HPF 02/06/2012 3-6   . Bacteria, UA 02/06/2012 FEW*  Admission on 01/24/2012, Discharged on 01/24/2012  Component Date Value  . Glucose-Capillary 01/24/2012 278*     Results for this Opt Visit:     Results for orders placed during the hospital encounter of 02/12/12  GLUCOSE, CAPILLARY      Component Value Range   Glucose-Capillary 408 (*) 70 - 99 mg/dL   Comment 1 Documented in Chart     Comment 2 Call MD NNP PA CNM    CULTURE, BLOOD (ROUTINE X 2)      Component Value Range   Specimen Description BLOOD RIGHT ANTECUBITAL     Special Requests BOTTLES DRAWN AEROBIC AND ANAEROBIC 4CC     Culture  Setup Time 02/14/2012  04:20     Culture       Value: MICROAEROPHILIC STREPTOCOCCI     Note: Standardized  susceptibility testing for this organism is not available.     Note: Gram Stain Report Called to,Read Back By and Verified With: HUTSON  L AT 2310 02/13/12 BY Jeanmarie Plant J  Performed at [facility]   Report Status 02/17/2012 FINAL    CULTURE, BLOOD (ROUTINE X 2)      Component Value Range   Specimen Description BLOOD LEFT HAND     Special Requests BOTTLES DRAWN AEROBIC AND ANAEROBIC 6CC     Culture  Setup Time 02/15/2012 01:20     Culture       Value: MICROAEROPHILIC STREPTOCOCCI     Note: Standardized susceptibility testing for this organism is not available.     2310 Note: Gram Stain Report Called to,Read Back By and Verified With: HUTSON L 02/13/2012 WOODIE J   Report Status 02/17/2012 FINAL    CBC WITH DIFFERENTIAL      Component Value Range   WBC 13.8 (*) 4.0 - 10.5 K/uL   RBC 3.54 (*) 3.87 - 5.11 MIL/uL   Hemoglobin 9.5 (*) 12.0 - 15.0 g/dL   HCT 27.9 (*) 36.0 - 46.0 %   MCV 78.8  78.0 - 100.0 fL   MCH 26.8  26.0 - 34.0 pg   MCHC 34.1  30.0 - 36.0 g/dL   RDW 15.5  11.5 - 15.5 %   Platelets 263  150 - 400 K/uL   Neutrophils Relative 88 (*) 43 - 77 %   Neutro Abs 12.1 (*) 1.7 - 7.7 K/uL   Lymphocytes Relative 5 (*) 12 - 46 %   Lymphs Abs 0.7  0.7 - 4.0 K/uL   Monocytes Relative 7  3 - 12 %   Monocytes Absolute 0.9  0.1 - 1.0 K/uL   Eosinophils Relative 0  0 - 5 %   Eosinophils Absolute 0.0  0.0 - 0.7 K/uL   Basophils Relative 0  0 - 1 %   Basophils Absolute 0.0  0.0 - 0.1 K/uL  COMPREHENSIVE METABOLIC PANEL      Component Value Range   Sodium 126 (*) 135 - 145 mEq/L   Potassium 4.3  3.5 - 5.1 mEq/L   Chloride 93 (*) 96 - 112 mEq/L   CO2 22  19 - 32 mEq/L   Glucose, Bld 433 (*) 70 - 99 mg/dL   BUN 34 (*) 6 - 23 mg/dL   Creatinine, Ser 1.04  0.50 - 1.10 mg/dL   Calcium 8.2 (*) 8.4 - 10.5 mg/dL   Total Protein 6.5  6.0 - 8.3 g/dL   Albumin 2.5 (*) 3.5 - 5.2 g/dL   AST 13  0 - 37 U/L    ALT 12  0 - 35 U/L   Alkaline Phosphatase 101  39 - 117 U/L   Total Bilirubin 0.5  0.3 - 1.2 mg/dL   GFR calc non Af Amer 55 (*) >90 mL/min   GFR calc Af Amer 63 (*) >90 mL/min  URINALYSIS, ROUTINE W REFLEX MICROSCOPIC      Component Value Range   Color, Urine YELLOW  YELLOW   APPearance HAZY (*) CLEAR   Specific Gravity, Urine 1.020  1.005 - 1.030   pH 5.5  5.0 - 8.0   Glucose, UA >1000 (*) NEGATIVE mg/dL   Hgb urine dipstick LARGE (*) NEGATIVE   Bilirubin Urine NEGATIVE  NEGATIVE   Ketones, ur NEGATIVE  NEGATIVE mg/dL   Protein, ur NEGATIVE  NEGATIVE mg/dL   Urobilinogen, UA 0.2  0.0 - 1.0 mg/dL  Nitrite NEGATIVE  NEGATIVE   Leukocytes, UA NEGATIVE  NEGATIVE  URINE CULTURE      Component Value Range   Specimen Description URINE, CATHETERIZED     Special Requests Normal     Culture  Setup Time 02/13/2012 22:16     Colony Count NO GROWTH     Culture NO GROWTH     Report Status 02/15/2012 FINAL    URINE MICROSCOPIC-ADD ON      Component Value Range   Squamous Epithelial / LPF FEW (*) RARE   WBC, UA 7-10  <3 WBC/hpf   RBC / HPF 11-20  <3 RBC/hpf   Bacteria, UA MANY (*) RARE   Casts GRANULAR CAST (*) NEGATIVE  COMPREHENSIVE METABOLIC PANEL      Component Value Range   Sodium 130 (*) 135 - 145 mEq/L   Potassium 3.7  3.5 - 5.1 mEq/L   Chloride 99  96 - 112 mEq/L   CO2 24  19 - 32 mEq/L   Glucose, Bld 370 (*) 70 - 99 mg/dL   BUN 33 (*) 6 - 23 mg/dL   Creatinine, Ser 0.98  0.50 - 1.10 mg/dL   Calcium 7.9 (*) 8.4 - 10.5 mg/dL   Total Protein 5.7 (*) 6.0 - 8.3 g/dL   Albumin 2.1 (*) 3.5 - 5.2 g/dL   AST 12  0 - 37 U/L   ALT 10  0 - 35 U/L   Alkaline Phosphatase 89  39 - 117 U/L   Total Bilirubin 0.3  0.3 - 1.2 mg/dL   GFR calc non Af Amer 59 (*) >90 mL/min   GFR calc Af Amer 68 (*) >90 mL/min  CBC      Component Value Range   WBC 15.2 (*) 4.0 - 10.5 K/uL   RBC 3.10 (*) 3.87 - 5.11 MIL/uL   Hemoglobin 8.1 (*) 12.0 - 15.0 g/dL   HCT 24.4 (*) 36.0 - 46.0 %   MCV 78.7   78.0 - 100.0 fL   MCH 26.1  26.0 - 34.0 pg   MCHC 33.2  30.0 - 36.0 g/dL   RDW 15.6 (*) 11.5 - 15.5 %   Platelets 250  150 - 400 K/uL  PROTIME-INR      Component Value Range   Prothrombin Time 16.4 (*) 11.6 - 15.2 seconds   INR 1.30  0.00 - 1.49  CK      Component Value Range   Total CK 111  7 - 177 U/L  SEDIMENTATION RATE      Component Value Range   Sed Rate 80 (*) 0 - 22 mm/hr  VITAMIN B12      Component Value Range   Vitamin B-12 689  211 - 911 pg/mL  FOLATE      Component Value Range   Folate 16.0    IRON AND TIBC      Component Value Range   Iron 11 (*) 42 - 135 ug/dL   TIBC 183 (*) 250 - 470 ug/dL   Saturation Ratios 6 (*) 20 - 55 %   UIBC 172  125 - 400 ug/dL  FERRITIN      Component Value Range   Ferritin 334 (*) 10 - 291 ng/mL  RETICULOCYTES      Component Value Range   Retic Ct Pct 0.9  0.4 - 3.1 %   RBC. 3.47 (*) 3.87 - 5.11 MIL/uL   Retic Count, Manual 31.2  19.0 - 186.0 K/uL  HEMOGLOBIN A1C  Component Value Range   Hemoglobin A1C 10.5 (*) <5.7 %   Mean Plasma Glucose 255 (*) <117 mg/dL  TSH      Component Value Range   TSH 6.697 (*) 0.350 - 4.500 uIU/mL  T4, FREE      Component Value Range   Free T4 0.94  0.80 - 1.80 ng/dL  GLUCOSE, CAPILLARY      Component Value Range   Glucose-Capillary 333 (*) 70 - 99 mg/dL   Comment 1 Documented in Chart     Comment 2 Notify RN    GLUCOSE, CAPILLARY      Component Value Range   Glucose-Capillary 352 (*) 70 - 99 mg/dL   Comment 1 Documented in Chart     Comment 2 Notify RN    GLUCOSE, CAPILLARY      Component Value Range   Glucose-Capillary 98  70 - 99 mg/dL   Comment 1 Documented in Chart     Comment 2 Notify RN    CBC      Component Value Range   WBC 9.5  4.0 - 10.5 K/uL   RBC 3.44 (*) 3.87 - 5.11 MIL/uL   Hemoglobin 9.2 (*) 12.0 - 15.0 g/dL   HCT 27.5 (*) 36.0 - 46.0 %   MCV 79.9  78.0 - 100.0 fL   MCH 26.7  26.0 - 34.0 pg   MCHC 33.5  30.0 - 36.0 g/dL   RDW 15.6 (*) 11.5 - 15.5 %    Platelets 298  150 - 400 K/uL  BASIC METABOLIC PANEL      Component Value Range   Sodium 133 (*) 135 - 145 mEq/L   Potassium 4.5  3.5 - 5.1 mEq/L   Chloride 101  96 - 112 mEq/L   CO2 24  19 - 32 mEq/L   Glucose, Bld 247 (*) 70 - 99 mg/dL   BUN 32 (*) 6 - 23 mg/dL   Creatinine, Ser 1.02  0.50 - 1.10 mg/dL   Calcium 8.2 (*) 8.4 - 10.5 mg/dL   GFR calc non Af Amer 56 (*) >90 mL/min   GFR calc Af Amer 65 (*) >90 mL/min  GLUCOSE, CAPILLARY      Component Value Range   Glucose-Capillary 156 (*) 70 - 99 mg/dL  VANCOMYCIN, TROUGH      Component Value Range   Vancomycin Tr 27.2 (*) 10.0 - 20.0 ug/mL  GLUCOSE, CAPILLARY      Component Value Range   Glucose-Capillary 210 (*) 70 - 99 mg/dL   Comment 1 Documented in Chart     Comment 2 Notify RN    GLUCOSE, CAPILLARY      Component Value Range   Glucose-Capillary 221 (*) 70 - 99 mg/dL   Comment 1 Documented in Chart     Comment 2 Notify RN    GLUCOSE, CAPILLARY      Component Value Range   Glucose-Capillary 224 (*) 70 - 99 mg/dL   Comment 1 Documented in Chart     Comment 2 Notify RN    OCCULT BLOOD X 1 CARD TO LAB, STOOL      Component Value Range   Fecal Occult Bld NEGATIVE    BASIC METABOLIC PANEL      Component Value Range   Sodium 138  135 - 145 mEq/L   Potassium 4.4  3.5 - 5.1 mEq/L   Chloride 104  96 - 112 mEq/L   CO2 27  19 - 32 mEq/L   Glucose, Bld 223 (*)  70 - 99 mg/dL   BUN 22  6 - 23 mg/dL   Creatinine, Ser 0.88  0.50 - 1.10 mg/dL   Calcium 8.6  8.4 - 10.5 mg/dL   GFR calc non Af Amer 67 (*) >90 mL/min   GFR calc Af Amer 78 (*) >90 mL/min  CBC      Component Value Range   WBC 6.5  4.0 - 10.5 K/uL   RBC 3.38 (*) 3.87 - 5.11 MIL/uL   Hemoglobin 8.8 (*) 12.0 - 15.0 g/dL   HCT 26.7 (*) 36.0 - 46.0 %   MCV 79.0  78.0 - 100.0 fL   MCH 26.0  26.0 - 34.0 pg   MCHC 33.0  30.0 - 36.0 g/dL   RDW 15.5  11.5 - 15.5 %   Platelets 299  150 - 400 K/uL  GLUCOSE, CAPILLARY      Component Value Range   Glucose-Capillary 157  (*) 70 - 99 mg/dL   Comment 1 Notify RN     Comment 2 Documented in Chart    GLUCOSE, CAPILLARY      Component Value Range   Glucose-Capillary 183 (*) 70 - 99 mg/dL   Comment 1 Notify RN    GLUCOSE, CAPILLARY      Component Value Range   Glucose-Capillary 261 (*) 70 - 99 mg/dL  GLUCOSE, CAPILLARY      Component Value Range   Glucose-Capillary 247 (*) 70 - 99 mg/dL   Comment 1 Notify RN    GLUCOSE, CAPILLARY      Component Value Range   Glucose-Capillary 131 (*) 70 - 99 mg/dL   Comment 1 Notify RN     Comment 2 Documented in Chart    GLUCOSE, CAPILLARY      Component Value Range   Glucose-Capillary 65 (*) 70 - 99 mg/dL   Comment 1 Notify RN     Comment 2 Documented in Chart    GLUCOSE, CAPILLARY      Component Value Range   Glucose-Capillary 81  70 - 99 mg/dL   Comment 1 Notify RN     Comment 2 Documented in Chart    GLUCOSE, CAPILLARY      Component Value Range   Glucose-Capillary 113 (*) 70 - 99 mg/dL   Comment 1 Notify RN     Comment 2 Documented in Chart    GLUCOSE, CAPILLARY      Component Value Range   Glucose-Capillary 122 (*) 70 - 99 mg/dL   Comment 1 Notify RN     Comment 2 Documented in Chart    GLUCOSE, CAPILLARY      Component Value Range   Glucose-Capillary 157 (*) 70 - 99 mg/dL  GLUCOSE, CAPILLARY      Component Value Range   Glucose-Capillary 214 (*) 70 - 99 mg/dL   Comment 1 Notify RN    GLUCOSE, CAPILLARY      Component Value Range   Glucose-Capillary 165 (*) 70 - 99 mg/dL   Comment 1 Notify RN     Comment 2 Documented in Chart    GLUCOSE, CAPILLARY      Component Value Range   Glucose-Capillary 127 (*) 70 - 99 mg/dL   Comment 1 Notify RN     Comment 2 Documented in Chart    GLUCOSE, CAPILLARY      Component Value Range   Glucose-Capillary 172 (*) 70 - 99 mg/dL   Comment 1 Notify RN    GLUCOSE, CAPILLARY  Component Value Range   Glucose-Capillary 189 (*) 70 - 99 mg/dL   Comment 1 Notify RN    GLUCOSE, CAPILLARY      Component Value  Range   Glucose-Capillary 259 (*) 70 - 99 mg/dL   Comment 1 Notify RN      EKG Orders placed during the hospital encounter of 02/12/12  . EKG     Prior Assessment and Plan Problem List as of 04/18/2012            Cardiology Problems   Acute exacerbation of CHF (congestive heart failure)   Last Assessment & Plan Note   08/04/2011 Office Visit Signed 08/04/2011  2:44 PM by Lendon Colonel, NP    No evidence of fluid retention at this time. Low salt diet is provided for her review    Hyperlipidemia   Hypertension   PVD (peripheral vascular disease)     Other   Diabetes mellitus with neuropathy   OSA (obstructive sleep apnea)   Last Assessment & Plan Note   08/04/2011 Office Visit Signed 08/04/2011  2:43 PM by Lendon Colonel, NP    She has not been using CPAP as directed, and not sleeping well. I have encouraged her to use this. She is to follow-up with her PCP for further treatment.    Acute bronchitis   Generalized weakness   Acute respiratory failure   Restless leg syndrome   Anemia   Tachycardia   Last Assessment & Plan Note   11/08/2011 Office Visit Signed 11/08/2011  5:00 PM by Lendon Colonel, NP    She is tachycardic today. Has not taken medications and is anxious about current financial situation and lack of a job. She comes with multiple somatic complaints. No changes in her medications. No further testing planned. Will see her in 6 months.    Hyperkalemia   Morbid obesity   Open wound of heel   Cellulitis and abscess of foot   Hematuria, microscopic   Hyponatremia   Osteomyelitis of right foot   Bacteremia due to Streptococcus   Systemic inflammatory response syndrome (SIRS)   Hypoglycemia associated with diabetes   Anxiety       Imaging: No results found.   FRS Calculation: Score not calculated. Missing: Total Cholesterol, HDL

## 2012-04-18 NOTE — Patient Instructions (Addendum)
Your physician recommends that you schedule a follow-up appointment in: [place]  Please contact our office if you lose another 10-15 pounds per may need to adjust blood pressure medication due to lowered numbers since current 25 pound weightloss.  Have your home health nurse contact office if your blood pressure readings are noted below [id number] systolic (top number)

## 2012-04-18 NOTE — Assessment & Plan Note (Signed)
Blood pressure is low normal. Dropping from 159./82 on 02/08/2012 to current level of 110/54 with 20 lb wt loss.. Will continue to monitor this as she is only on metoprolol as discussed above. Dosage adjustment is recommended for BP < [id number] systolic.  Will follow.

## 2012-04-18 NOTE — Addendum Note (Signed)
Addended by: Shara Blazing A on: 04/18/2012 02:19 PM   Modules accepted: Orders

## 2012-04-19 ENCOUNTER — Telehealth: Payer: Self-pay | Admitting: Adult Health

## 2012-04-19 MED ORDER — METOPROLOL TARTRATE 50 MG PO TABS: 50.0000 mg | ORAL_TABLET | Freq: Two times a day (BID) | ORAL | Status: DC

## 2012-04-19 NOTE — Telephone Encounter (Signed)
Patient would like to discuss Metoprolol. / tg

## 2012-04-19 NOTE — Telephone Encounter (Signed)
Toprol XL was going to cost her 35 $ per month and she requested metoprolol tartrate.  E Script was sent in for 50 mg bid.

## 2012-05-15 ENCOUNTER — Encounter: Payer: Self-pay | Admitting: Adult Health

## 2012-08-28 ENCOUNTER — Encounter: Payer: Self-pay | Admitting: Adult Health

## 2012-10-07 ENCOUNTER — Encounter (HOSPITAL_COMMUNITY): Payer: Self-pay | Admitting: *Deleted

## 2012-10-07 ENCOUNTER — Emergency Department (HOSPITAL_COMMUNITY): Payer: Medicare Other

## 2012-10-07 ENCOUNTER — Inpatient Hospital Stay (HOSPITAL_COMMUNITY)
Admission: EM | Admit: 2012-10-07 | Discharge: 2012-10-10 | DRG: 872 | Disposition: A | Discharge status: Home Health | Payer: Medicare Other | Attending: Internal Medicine | Admitting: Internal Medicine

## 2012-10-07 DIAGNOSIS — Z794 Long term (current) use of insulin: Secondary | ICD-10-CM

## 2012-10-07 DIAGNOSIS — R000 Tachycardia, unspecified: Secondary | ICD-10-CM | POA: Diagnosis present

## 2012-10-07 DIAGNOSIS — J209 Acute bronchitis, unspecified: Secondary | ICD-10-CM

## 2012-10-07 DIAGNOSIS — G2581 Restless legs syndrome: Secondary | ICD-10-CM | POA: Diagnosis present

## 2012-10-07 DIAGNOSIS — F419 Anxiety disorder, unspecified: Secondary | ICD-10-CM

## 2012-10-07 DIAGNOSIS — N179 Acute kidney failure, unspecified: Secondary | ICD-10-CM | POA: Diagnosis present

## 2012-10-07 DIAGNOSIS — R531 Weakness: Secondary | ICD-10-CM

## 2012-10-07 DIAGNOSIS — R3129 Other microscopic hematuria: Secondary | ICD-10-CM | POA: Diagnosis present

## 2012-10-07 DIAGNOSIS — E1169 Type 2 diabetes mellitus with other specified complication: Secondary | ICD-10-CM | POA: Diagnosis present

## 2012-10-07 DIAGNOSIS — M869 Osteomyelitis, unspecified: Secondary | ICD-10-CM | POA: Diagnosis present

## 2012-10-07 DIAGNOSIS — E119 Type 2 diabetes mellitus without complications: Secondary | ICD-10-CM | POA: Diagnosis present

## 2012-10-07 DIAGNOSIS — L97509 Non-pressure chronic ulcer of other part of unspecified foot with unspecified severity: Secondary | ICD-10-CM | POA: Diagnosis present

## 2012-10-07 DIAGNOSIS — E1142 Type 2 diabetes mellitus with diabetic polyneuropathy: Secondary | ICD-10-CM | POA: Diagnosis present

## 2012-10-07 DIAGNOSIS — E875 Hyperkalemia: Secondary | ICD-10-CM | POA: Diagnosis present

## 2012-10-07 DIAGNOSIS — S91309A Unspecified open wound, unspecified foot, initial encounter: Secondary | ICD-10-CM

## 2012-10-07 DIAGNOSIS — Z833 Family history of diabetes mellitus: Secondary | ICD-10-CM

## 2012-10-07 DIAGNOSIS — R5381 Other malaise: Secondary | ICD-10-CM | POA: Diagnosis present

## 2012-10-07 DIAGNOSIS — I10 Essential (primary) hypertension: Secondary | ICD-10-CM | POA: Diagnosis present

## 2012-10-07 DIAGNOSIS — E11621 Type 2 diabetes mellitus with foot ulcer: Secondary | ICD-10-CM | POA: Diagnosis present

## 2012-10-07 DIAGNOSIS — N390 Urinary tract infection, site not specified: Secondary | ICD-10-CM | POA: Diagnosis present

## 2012-10-07 DIAGNOSIS — E871 Hypo-osmolality and hyponatremia: Secondary | ICD-10-CM | POA: Diagnosis present

## 2012-10-07 DIAGNOSIS — G4733 Obstructive sleep apnea (adult) (pediatric): Secondary | ICD-10-CM | POA: Diagnosis present

## 2012-10-07 DIAGNOSIS — I509 Heart failure, unspecified: Secondary | ICD-10-CM | POA: Diagnosis present

## 2012-10-07 DIAGNOSIS — R7881 Bacteremia: Secondary | ICD-10-CM | POA: Insufficient documentation

## 2012-10-07 DIAGNOSIS — J9600 Acute respiratory failure, unspecified whether with hypoxia or hypercapnia: Secondary | ICD-10-CM

## 2012-10-07 DIAGNOSIS — E785 Hyperlipidemia, unspecified: Secondary | ICD-10-CM | POA: Diagnosis present

## 2012-10-07 DIAGNOSIS — D649 Anemia, unspecified: Secondary | ICD-10-CM

## 2012-10-07 DIAGNOSIS — M9080 Osteopathy in diseases classified elsewhere, unspecified site: Secondary | ICD-10-CM | POA: Diagnosis present

## 2012-10-07 DIAGNOSIS — Z6836 Body mass index (BMI) 36.0-36.9, adult: Secondary | ICD-10-CM

## 2012-10-07 DIAGNOSIS — A4901 Methicillin susceptible Staphylococcus aureus infection, unspecified site: Secondary | ICD-10-CM | POA: Diagnosis present

## 2012-10-07 DIAGNOSIS — I739 Peripheral vascular disease, unspecified: Secondary | ICD-10-CM | POA: Diagnosis present

## 2012-10-07 DIAGNOSIS — E1149 Type 2 diabetes mellitus with other diabetic neurological complication: Secondary | ICD-10-CM | POA: Diagnosis present

## 2012-10-07 DIAGNOSIS — Z79899 Other long term (current) drug therapy: Secondary | ICD-10-CM

## 2012-10-07 LAB — CBC WITH DIFFERENTIAL/PLATELET
Basophils Absolute: 0 10*3/uL (ref 0.0–0.1)
Lymphocytes Relative: 3 % — ABNORMAL LOW (ref 12–46)
Lymphs Abs: 0.4 10*3/uL — ABNORMAL LOW (ref 0.7–4.0)
MCV: 82.9 fL (ref 78.0–100.0)
Neutro Abs: 13.6 10*3/uL — ABNORMAL HIGH (ref 1.7–7.7)
Platelets: 233 10*3/uL (ref 150–400)
RBC: 3.98 MIL/uL (ref 3.87–5.11)
RDW: 13.7 % (ref 11.5–15.5)
WBC: 14.3 10*3/uL — ABNORMAL HIGH (ref 4.0–10.5)

## 2012-10-07 LAB — COMPREHENSIVE METABOLIC PANEL
ALT: 9 U/L (ref 0–35)
AST: 15 U/L (ref 0–37)
Alkaline Phosphatase: 110 U/L (ref 39–117)
CO2: 25 mEq/L (ref 19–32)
Chloride: 95 mEq/L — ABNORMAL LOW (ref 96–112)
GFR calc Af Amer: 60 mL/min — ABNORMAL LOW (ref >90)
GFR calc non Af Amer: 52 mL/min — ABNORMAL LOW (ref >90)
Glucose, Bld: 323 mg/dL — ABNORMAL HIGH (ref 70–99)
Potassium: 3.9 mEq/L (ref 3.5–5.1)
Sodium: 133 mEq/L — ABNORMAL LOW (ref 135–145)
Total Bilirubin: 0.8 mg/dL (ref 0.3–1.2)

## 2012-10-07 LAB — URINALYSIS, ROUTINE W REFLEX MICROSCOPIC
Bilirubin Urine: NEGATIVE
Ketones, ur: >80 mg/dL — AB
Specific Gravity, Urine: 1.025 (ref 1.005–1.030)
Urobilinogen, UA: 0.2 mg/dL (ref 0.0–1.0)

## 2012-10-07 LAB — URINE MICROSCOPIC-ADD ON

## 2012-10-07 MED ORDER — ALPRAZOLAM 1 MG PO TABS
1.0000 mg | ORAL_TABLET | Freq: Three times a day (TID) | ORAL | Status: DC | PRN
  Administered 2012-10-08 – 2012-10-09 (×2): 1 mg via ORAL
  Filled 2012-10-07 (×2): qty 1

## 2012-10-07 MED ORDER — ALUM & MAG HYDROXIDE-SIMETH 200-200-20 MG/5ML PO SUSP: 30.0000 mL | Freq: Four times a day (QID) | ORAL | Status: DC | PRN

## 2012-10-07 MED ORDER — ENOXAPARIN SODIUM 40 MG/0.4ML ~~LOC~~ SOLN: 40.0000 mg | SUBCUTANEOUS | Status: DC

## 2012-10-07 MED ORDER — SODIUM CHLORIDE 0.9 % IV BOLUS (SEPSIS)
500.0000 mL | Freq: Once | INTRAVENOUS | Status: AC
  Administered 2012-10-07: 500 mL via INTRAVENOUS

## 2012-10-07 MED ORDER — ASPIRIN EC 81 MG PO TBEC
81.0000 mg | DELAYED_RELEASE_TABLET | Freq: Every day | ORAL | Status: DC
  Administered 2012-10-08 – 2012-10-10 (×3): 81 mg via ORAL
  Filled 2012-10-07 (×3): qty 1

## 2012-10-07 MED ORDER — TRAMADOL HCL 50 MG PO TABS
50.0000 mg | ORAL_TABLET | Freq: Four times a day (QID) | ORAL | Status: DC | PRN
  Administered 2012-10-09: 50 mg via ORAL
  Filled 2012-10-07: qty 1

## 2012-10-07 MED ORDER — CYCLOBENZAPRINE HCL 10 MG PO TABS: 5.0000 mg | ORAL_TABLET | Freq: Three times a day (TID) | ORAL | Status: DC | PRN

## 2012-10-07 MED ORDER — ALBUTEROL SULFATE (5 MG/ML) 0.5% IN NEBU: 2.5000 mg | INHALATION_SOLUTION | RESPIRATORY_TRACT | Status: DC | PRN

## 2012-10-07 MED ORDER — DEXTROSE 5 % IV SOLN
1.0000 g | INTRAVENOUS | Status: DC
  Filled 2012-10-07: qty 10

## 2012-10-07 MED ORDER — ACETAMINOPHEN 325 MG PO TABS
650.0000 mg | ORAL_TABLET | Freq: Four times a day (QID) | ORAL | Status: DC | PRN
Start: 2012-10-07 — End: 2012-10-10
  Administered 2012-10-08: 650 mg via ORAL
  Filled 2012-10-07: qty 2

## 2012-10-07 MED ORDER — INSULIN ASPART 100 UNIT/ML ~~LOC~~ SOLN
0.0000 [IU] | Freq: Three times a day (TID) | SUBCUTANEOUS | Status: DC
  Administered 2012-10-08: 8 [IU] via SUBCUTANEOUS
  Administered 2012-10-08: 5 [IU] via SUBCUTANEOUS
  Administered 2012-10-08: 3 [IU] via SUBCUTANEOUS
  Administered 2012-10-09: 5 [IU] via SUBCUTANEOUS
  Administered 2012-10-09 – 2012-10-10 (×3): 3 [IU] via SUBCUTANEOUS
  Administered 2012-10-10: 5 [IU] via SUBCUTANEOUS

## 2012-10-07 MED ORDER — CILOSTAZOL 100 MG PO TABS
100.0000 mg | ORAL_TABLET | Freq: Every day | ORAL | Status: DC
  Administered 2012-10-08 – 2012-10-10 (×3): 100 mg via ORAL
  Filled 2012-10-07 (×4): qty 1

## 2012-10-07 MED ORDER — ADULT MULTIVITAMIN W/MINERALS CH
1.0000 | ORAL_TABLET | Freq: Every day | ORAL | Status: DC
  Administered 2012-10-08 – 2012-10-10 (×3): 1 via ORAL
  Filled 2012-10-07 (×3): qty 1

## 2012-10-07 MED ORDER — DOCUSATE SODIUM 100 MG PO CAPS
100.0000 mg | ORAL_CAPSULE | Freq: Two times a day (BID) | ORAL | Status: DC
  Administered 2012-10-07 – 2012-10-10 (×6): 100 mg via ORAL
  Filled 2012-10-07 (×5): qty 1

## 2012-10-07 MED ORDER — DEXTROSE 5 % IV SOLN
1.0000 g | Freq: Once | INTRAVENOUS | Status: AC
  Administered 2012-10-07: 1 g via INTRAVENOUS
  Filled 2012-10-07: qty 10

## 2012-10-07 MED ORDER — SODIUM CHLORIDE 0.9 % IV SOLN
INTRAVENOUS | Status: DC
  Administered 2012-10-08: via INTRAVENOUS

## 2012-10-07 MED ORDER — TRAZODONE HCL 50 MG PO TABS
50.0000 mg | ORAL_TABLET | Freq: Every day | ORAL | Status: DC
  Administered 2012-10-08 – 2012-10-09 (×3): 50 mg via ORAL
  Filled 2012-10-07: qty 1
  Filled 2012-10-07: qty 2
  Filled 2012-10-07: qty 1

## 2012-10-07 MED ORDER — INSULIN ASPART 100 UNIT/ML ~~LOC~~ SOLN
0.0000 [IU] | Freq: Every day | SUBCUTANEOUS | Status: DC
  Administered 2012-10-08: 3 [IU] via SUBCUTANEOUS
  Administered 2012-10-08 – 2012-10-09 (×2): 2 [IU] via SUBCUTANEOUS

## 2012-10-07 MED ORDER — INSULIN ASPART 100 UNIT/ML ~~LOC~~ SOLN
4.0000 [IU] | Freq: Three times a day (TID) | SUBCUTANEOUS | Status: DC
  Administered 2012-10-08 – 2012-10-10 (×8): 4 [IU] via SUBCUTANEOUS

## 2012-10-07 MED ORDER — ACETAMINOPHEN 650 MG RE SUPP: 650.0000 mg | Freq: Four times a day (QID) | RECTAL | Status: DC | PRN

## 2012-10-07 MED ORDER — POLYETHYLENE GLYCOL 3350 17 G PO PACK: 17.0000 g | PACK | Freq: Every day | ORAL | Status: DC | PRN

## 2012-10-07 MED ORDER — OMEGA-3-ACID ETHYL ESTERS 1 G PO CAPS
1.0000 g | ORAL_CAPSULE | Freq: Every day | ORAL | Status: DC
  Administered 2012-10-08 – 2012-10-10 (×3): 1 g via ORAL
  Filled 2012-10-07 (×3): qty 1

## 2012-10-07 MED ORDER — ONDANSETRON HCL 4 MG PO TABS: 4.0000 mg | ORAL_TABLET | Freq: Four times a day (QID) | ORAL | Status: DC | PRN

## 2012-10-07 MED ORDER — ACETAMINOPHEN 325 MG PO TABS
650.0000 mg | ORAL_TABLET | Freq: Once | ORAL | Status: AC
Start: 2012-10-07 — End: 2012-10-07
  Administered 2012-10-07: 650 mg via ORAL
  Filled 2012-10-07: qty 2

## 2012-10-07 MED ORDER — PRAMIPEXOLE DIHYDROCHLORIDE 1 MG PO TABS
0.5000 mg | ORAL_TABLET | Freq: Every day | ORAL | Status: DC
  Administered 2012-10-08 (×2): 0.5 mg via ORAL
  Filled 2012-10-07 (×2): qty 1

## 2012-10-07 MED ORDER — DEXTROSE 5 % IV SOLN
INTRAVENOUS | Status: AC
  Administered 2012-10-07: 1 g via INTRAVENOUS
  Filled 2012-10-07: qty 10

## 2012-10-07 MED ORDER — MORPHINE SULFATE 2 MG/ML IJ SOLN: 2.0000 mg | INTRAMUSCULAR | Status: DC | PRN

## 2012-10-07 MED ORDER — ONDANSETRON HCL 4 MG/2ML IJ SOLN
4.0000 mg | Freq: Four times a day (QID) | INTRAMUSCULAR | Status: DC | PRN
  Filled 2012-10-07: qty 2

## 2012-10-07 MED ORDER — METOPROLOL TARTRATE 50 MG PO TABS
50.0000 mg | ORAL_TABLET | Freq: Two times a day (BID) | ORAL | Status: DC
  Administered 2012-10-08 – 2012-10-10 (×5): 50 mg via ORAL
  Filled 2012-10-07 (×5): qty 1

## 2012-10-07 NOTE — ED Notes (Signed)
Patient prepared to transport to room #223.  IV Rocephin paused and to be restarted on floor.

## 2012-10-07 NOTE — ED Provider Notes (Signed)
History     CSN: [id number]  Arrival date & time 10/07/12  [id number]   First MD Initiated Contact with Patient 10/07/12 1939      Chief Complaint  Patient presents with  . Fever  . Hypertension    (Consider location/radiation/quality/duration/timing/severity/associated sxs/prior treatment) Patient is a 67 y.o. female presenting with fever and hypertension. The history is provided by the patient (the pt complains of fever and chills).  Fever Temp source:  Subjective Severity:  Moderate Onset quality:  Sudden Timing:  Constant Progression:  Waxing and waning Chronicity:  New Relieved by:  Nothing Worsened by:  Nothing tried Associated symptoms: no chest pain, no congestion, no cough, no diarrhea, no headaches and no rash   Hypertension Pertinent negatives include no chest pain, no abdominal pain and no headaches.    Past Medical History  Diagnosis Date  . CHF (congestive heart failure)   . Diabetes mellitus   . Restless leg syndrome 07/25/2011  . OSA (obstructive sleep apnea) 07/25/2011  . Wound, open     right foot  . Diabetes mellitus with neuropathy 07/25/2011  . Cellulitis and abscess of foot 02/12/2012  . Osteomyelitis of right foot 02/14/2012  . Partial Achilles tendon tear 02/14/2012    Past Surgical History  Procedure Laterality Date  . Abdominal hysterectomy    . Achilles tendon repair    . Toe amputation      partial rt great toe    Family History  Problem Relation Age of Onset  . Diabetes Sister     History  Substance Use Topics  . Smoking status: Never Smoker   . Smokeless tobacco: Never Used  . Alcohol Use: No    OB History   Grav Para Term Preterm Abortions TAB SAB Ect Mult Living                  Review of Systems  Constitutional: Positive for fever and fatigue. Negative for appetite change.  HENT: Negative for congestion, sinus pressure and ear discharge.   Eyes: Negative for discharge.  Respiratory: Negative for cough.   Cardiovascular:  Negative for chest pain.  Gastrointestinal: Negative for abdominal pain and diarrhea.  Genitourinary: Negative for frequency and hematuria.  Musculoskeletal: Negative for back pain.  Skin: Negative for rash.  Neurological: Negative for seizures and headaches.  Psychiatric/Behavioral: Negative for hallucinations.    Allergies  Review of patient's allergies indicates no known allergies.  Home Medications   Current Outpatient Rx  Name  Route  Sig  Dispense  Refill  . FERROUS FUMARATE PO   Oral   Take 5 tablets by mouth daily.         . pramipexole (MIRAPEX) 0.5 MG tablet   Oral   Take 0.5 mg by mouth at bedtime.         . ALPRAZolam (XANAX) 1 MG tablet   Oral   Take 1 mg by mouth every 6 (six) hours as needed. Anxiety/Restless Leg         . cholecalciferol (VITAMIN D) 1000 UNITS tablet   Oral   Take 1,000 Units by mouth 2 (two) times daily.         . cilostazol (PLETAL) 100 MG tablet   Oral   Take 100 mg by mouth daily.         . cyclobenzaprine (FLEXERIL) 5 MG tablet   Oral   Take 5 mg by mouth 2 (two) times daily.          Marland Kitchen  insulin lispro protamine-insulin lispro (HUMALOG 50/50) (50-50) 100 UNIT/ML SUSP   Subcutaneous   Inject 20-40 Units into the skin 3 (three) times daily. 40 units in AM 20 units at [facility] and 30 units in PM         . metFORMIN (GLUCOPHAGE) 1000 MG tablet   Oral   Take 1,000 mg by mouth 2 (two) times daily with a meal.         . metoprolol (LOPRESSOR) 50 MG tablet   Oral   Take 1 tablet (50 mg total) by mouth 2 (two) times daily.   180 tablet   3     Please disregard script for Toprol XL 100 mg, as t ...   . Multiple Vitamin (MULTIVITAMIN WITH MINERALS) TABS   Oral   Take 1 tablet by mouth daily.         Marland Kitchen omega-3 acid ethyl esters (LOVAZA) 1 G capsule   Oral   Take 1 g by mouth daily.          Marland Kitchen rOPINIRole (REQUIP) 2 MG tablet   Oral   Take 2 mg by mouth at bedtime.         . traMADol (ULTRAM) 50 MG tablet    Oral   Take 50 mg by mouth 4 (four) times daily as needed. FOR PAIN         . traZODone (DESYREL) 50 MG tablet   Oral   Take 50-100 mg by mouth at bedtime.           BP 134/49  Pulse 114  Temp(Src) 101 F (38.3 C) (Oral)  Resp 20  Ht 5\' 10"  (1.778 m)  Wt 256 lb (116.121 kg)  BMI 36.73 kg/m2  SpO2 94%  LMP 07/25/2011  Physical Exam  Constitutional: She is oriented to person, place, and time. She appears well-developed.  HENT:  Head: Normocephalic.  Eyes: Conjunctivae and EOM are normal. No scleral icterus.  Neck: Neck supple. No thyromegaly present.  Cardiovascular: Normal rate and regular rhythm.  Exam reveals no gallop and no friction rub.   No murmur heard. Pulmonary/Chest: No stridor. She has no wheezes. She has no rales. She exhibits no tenderness.  Abdominal: She exhibits no distension. There is no tenderness. There is no rebound.  Musculoskeletal: Normal range of motion. She exhibits no edema.  Lymphadenopathy:    She has no cervical adenopathy.  Neurological: She is oriented to person, place, and time. Coordination normal.  Skin: No rash noted. No erythema.  Psychiatric: She has a normal mood and affect. Her behavior is normal.    ED Course  Procedures (including critical care time)  Labs Reviewed  CBC WITH DIFFERENTIAL - Abnormal; Notable for the following:    WBC 14.3 (*)    Hemoglobin 11.2 (*)    HCT 33.0 (*)    Neutrophils Relative 95 (*)    Neutro Abs 13.6 (*)    Lymphocytes Relative 3 (*)    Lymphs Abs 0.4 (*)    Monocytes Relative 2 (*)    All other components within normal limits  COMPREHENSIVE METABOLIC PANEL - Abnormal; Notable for the following:    Sodium 133 (*)    Chloride 95 (*)    Glucose, Bld 323 (*)    Albumin 3.1 (*)    GFR calc non Af Amer 52 (*)    GFR calc Af Amer 60 (*)    All other components within normal limits  URINALYSIS, ROUTINE W REFLEX MICROSCOPIC - Abnormal;  Notable for the following:    Glucose, UA 250 (*)    Hgb  urine dipstick LARGE (*)    Ketones, ur >80 (*)    Protein, ur 30 (*)    All other components within normal limits  URINE MICROSCOPIC-ADD ON - Abnormal; Notable for the following:    Squamous Epithelial / LPF FEW (*)    Bacteria, UA MANY (*)    All other components within normal limits  CULTURE, BLOOD (ROUTINE X 2)  CULTURE, BLOOD (ROUTINE X 2)  URINE CULTURE  LACTIC ACID, PLASMA   Dg Chest Port 1 View  10/07/2012  *RADIOLOGY REPORT*  Clinical Data: Fever and shortness of breath.  PORTABLE CHEST - 1 VIEW  Comparison: 02/15/2012  Findings: Lungs are adequately inflated without focal consolidation or effusion.  There is mild stable cardiomegaly.  Remainder of the exam is unchanged.  IMPRESSION: No acute cardiopulmonary disease.  Mild stable cardiomegaly.   Original Report Authenticated By: Marin Olp, M.D.      1. UTI (lower urinary tract infection)       MDM          Maudry Diego, MD 10/07/12 2214

## 2012-10-07 NOTE — ED Notes (Signed)
Family reporting fever of 103 earlier, and elevated BP 171/128 earlier today.  Pt reporting generalized weakness. Reporting chronic cough.  Pt denies nausea and vomiting.  Pt did begin to gag and vomit after coughing in triage.

## 2012-10-08 ENCOUNTER — Encounter (HOSPITAL_COMMUNITY): Payer: Self-pay

## 2012-10-08 DIAGNOSIS — N390 Urinary tract infection, site not specified: Secondary | ICD-10-CM

## 2012-10-08 DIAGNOSIS — E11621 Type 2 diabetes mellitus with foot ulcer: Secondary | ICD-10-CM | POA: Diagnosis present

## 2012-10-08 LAB — HEMOGLOBIN A1C: Hgb A1c MFr Bld: 8.6 % — ABNORMAL HIGH (ref <5.7)

## 2012-10-08 LAB — CBC
HCT: 31.6 % — ABNORMAL LOW (ref 36.0–46.0)
Hemoglobin: 10.7 g/dL — ABNORMAL LOW (ref 12.0–15.0)
MCH: 28.2 pg (ref 26.0–34.0)
MCHC: 33.9 g/dL (ref 30.0–36.0)

## 2012-10-08 LAB — GLUCOSE, CAPILLARY
Glucose-Capillary: 220 mg/dL — ABNORMAL HIGH (ref 70–99)
Glucose-Capillary: 195 mg/dL — ABNORMAL HIGH (ref 70–99)
Glucose-Capillary: 224 mg/dL — ABNORMAL HIGH (ref 70–99)

## 2012-10-08 LAB — BASIC METABOLIC PANEL
BUN: 27 mg/dL — ABNORMAL HIGH (ref 6–23)
Chloride: 95 mEq/L — ABNORMAL LOW (ref 96–112)
Glucose, Bld: 271 mg/dL — ABNORMAL HIGH (ref 70–99)
Potassium: 3.7 mEq/L (ref 3.5–5.1)

## 2012-10-08 MED ORDER — VANCOMYCIN HCL 10 G IV SOLR
1500.0000 mg | INTRAVENOUS | Status: DC
  Administered 2012-10-09 – 2012-10-10 (×2): 1500 mg via INTRAVENOUS
  Filled 2012-10-08 (×2): qty 1500

## 2012-10-08 MED ORDER — PIPERACILLIN-TAZOBACTAM 3.375 G IVPB
3.3750 g | Freq: Once | INTRAVENOUS | Status: AC
  Administered 2012-10-08: 3.375 g via INTRAVENOUS
  Filled 2012-10-08: qty 50

## 2012-10-08 MED ORDER — VANCOMYCIN HCL IN DEXTROSE 1-5 GM/200ML-% IV SOLN
1000.0000 mg | Freq: Once | INTRAVENOUS | Status: DC
  Filled 2012-10-08: qty 200

## 2012-10-08 MED ORDER — VANCOMYCIN HCL IN DEXTROSE 1-5 GM/200ML-% IV SOLN
1000.0000 mg | Freq: Once | INTRAVENOUS | Status: AC
  Administered 2012-10-08: 1000 mg via INTRAVENOUS
  Filled 2012-10-08: qty 200

## 2012-10-08 MED ORDER — INSULIN GLARGINE 100 UNIT/ML ~~LOC~~ SOLN
30.0000 [IU] | Freq: Every day | SUBCUTANEOUS | Status: DC
  Administered 2012-10-09 – 2012-10-10 (×2): 30 [IU] via SUBCUTANEOUS
  Filled 2012-10-08 (×3): qty 0.3

## 2012-10-08 MED ORDER — INSULIN GLARGINE 100 UNIT/ML ~~LOC~~ SOLN
20.0000 [IU] | Freq: Every day | SUBCUTANEOUS | Status: DC
  Administered 2012-10-08: 20 [IU] via SUBCUTANEOUS
  Filled 2012-10-08 (×2): qty 0.2

## 2012-10-08 MED ORDER — VANCOMYCIN HCL 10 G IV SOLR: 2000.0000 mg | Freq: Once | INTRAVENOUS | Status: DC

## 2012-10-08 MED ORDER — PIPERACILLIN-TAZOBACTAM 3.375 G IVPB
INTRAVENOUS | Status: AC
  Filled 2012-10-08: qty 50

## 2012-10-08 MED ORDER — PIPERACILLIN-TAZOBACTAM 3.375 G IVPB
3.3750 g | Freq: Three times a day (TID) | INTRAVENOUS | Status: DC
  Administered 2012-10-08 – 2012-10-10 (×6): 3.375 g via INTRAVENOUS
  Filled 2012-10-08 (×7): qty 50

## 2012-10-08 MED ORDER — SODIUM CHLORIDE 0.9 % IV SOLN: INTRAVENOUS | Status: AC

## 2012-10-08 MED ORDER — ENOXAPARIN SODIUM 60 MG/0.6ML ~~LOC~~ SOLN
60.0000 mg | SUBCUTANEOUS | Status: DC
  Administered 2012-10-08 – 2012-10-10 (×3): 60 mg via SUBCUTANEOUS
  Filled 2012-10-08 (×3): qty 0.6

## 2012-10-08 MED ORDER — VANCOMYCIN HCL IN DEXTROSE 1-5 GM/200ML-% IV SOLN
INTRAVENOUS | Status: AC
  Filled 2012-10-08: qty 200

## 2012-10-08 NOTE — Progress Notes (Signed)
     Subjective: This lady came in with fever. The etiology of this is probably a combination of UTI and infected diabetic foot ulcer in the right heel. Previously, the right heel ulcer was severely infected and has improved significantly as an outpatient wound care center in [place].           Physical Exam: Blood pressure 131/54, pulse 93, temperature 97.5 F (36.4 C), temperature source Oral, resp. rate 20, height 5\' 10"  (1.778 m), weight 118.6 kg (261 lb 7.5 oz), last menstrual period 07/25/2011, SpO2 93.00%. She does look systemically well. She is not toxic or septic. Heart sounds are present and normal. Lung fields are clear. She is alert and oriented . I did not examine the right heel today but I will do so tomorrow.   Investigations:  Recent Results (from the past 240 hour(s))  CULTURE, BLOOD (ROUTINE X 2)     Status: None   Collection Time    10/07/12  8:03 PM      Result Value Range Status   Specimen Description LEFT ANTECUBITAL   Final   Special Requests BOTTLES DRAWN AEROBIC AND ANAEROBIC 8CC   Final   Culture NO GROWTH 1 DAY   Final   Report Status PENDING   Incomplete  CULTURE, BLOOD (ROUTINE X 2)     Status: None   Collection Time    10/07/12  8:09 PM      Result Value Range Status   Specimen Description BLOOD LEFT HAND   Final   Special Requests BOTTLES DRAWN AEROBIC AND ANAEROBIC 6CC   Final   Culture NO GROWTH 1 DAY   Final   Report Status PENDING   Incomplete     Basic Metabolic Panel:  Recent Labs  10/07/12 2003 10/08/12 0545  NA 133* 132*  K 3.9 3.7  CL 95* 95*  CO2 25 27  GLUCOSE 323* 271*  BUN 22 27*  CREATININE 1.09 1.27*  CALCIUM 8.9 8.3*   Liver Function Tests:  Recent Labs  10/07/12 2003  AST 15  ALT 9  ALKPHOS 110  BILITOT 0.8  PROT 7.3  ALBUMIN 3.1*     CBC:  Recent Labs  10/07/12 2003 10/08/12 0545  WBC 14.3* 12.7*  NEUTROABS 13.6*  --   HGB 11.2* 10.7*  HCT 33.0* 31.6*  MCV 82.9 83.4  PLT 233 207    Dg  Chest Port 1 View  10/07/2012  *RADIOLOGY REPORT*  Clinical Data: Fever and shortness of breath.  PORTABLE CHEST - 1 VIEW  Comparison: 02/15/2012  Findings: Lungs are adequately inflated without focal consolidation or effusion.  There is mild stable cardiomegaly.  Remainder of the exam is unchanged.  IMPRESSION: No acute cardiopulmonary disease.  Mild stable cardiomegaly.   Original Report Authenticated By: Marin Olp, M.D.       Medications: I have reviewed the patient's current medications.  Impression: 1. Fever secondary to UTI and infected right heel diabetic foot ulcer. 2. Hypertension. 3. Morbid obesity. 4. Diabetes mellitus with neuropathy. 5. Acute renal failure secondary to mild dehydration.     Plan: 1. Increase IV fluid rate. 2. Continue with intravenous antibiotics. 3. Wound care consult.     LOS: 1 day   Doree Albee Pager [phone]  10/08/2012, 11:09 AM

## 2012-10-08 NOTE — H&P (Addendum)
[facility] History and Physical  Mary [name] [id number] DOB: [birth year] DOA: 10/07/2012  Referring physician: EDP Zammit  PCP: Wende Neighbors, MD  Specialists: none  Chief Complaint: Fever  HPI: Mary [name] is a 67 y.o. female with multiple chronic medical problems including poorly controlled diabetes, CHF, sleep apnea, hypertension and morbid obesity who has been undergoing wound center management for a chronic right heel diabetic foot ulcer that has associated calcaneal osteomyelitis which was diagnosed September 2013. She has severe peripheral neuropathy in her lower extremities as well is peripheral vascular disease. At the time of her last admission in September 2013 she had an associated strep bacteremia and surgery was consult obtained and there was a discussion regarding amputation given the severity of the wound, but a decision was made to first attempt conservative management with several weeks of IV antibiotics and wound care.   Her family brought her into the emergency department this evening with a reported fever of 103, increasing weakness and hypertension. She denies any chest pain, nausea vomiting or diarrhea. She denies any dysuria but does have urinary incontinence at baseline, increased frequency. Denies lower abdominal pain.   Review of Systems: Unable to obtain from patient.  Past Medical History  Diagnosis Date  . CHF (congestive heart failure)   . Diabetes mellitus   . Restless leg syndrome 07/25/2011  . OSA (obstructive sleep apnea) 07/25/2011  . Wound, open     right foot  . Diabetes mellitus with neuropathy 07/25/2011  . Cellulitis and abscess of foot 02/12/2012  . Osteomyelitis of right foot 02/14/2012  . Partial Achilles tendon tear 02/14/2012   Past Surgical History  Procedure Laterality Date  . Abdominal hysterectomy    . Achilles tendon repair    . Toe amputation      partial rt great toe   Social History:  reports that she has never smoked.  She has never used smokeless tobacco. She reports that she does not drink alcohol or use illicit drugs. Lives at home with her husband.  No Known Allergies  Family History  Problem Relation Age of Onset  . Diabetes Sister     Prior to Admission medications   Medication Sig Start Date End Date Taking? Authorizing Provider  FERROUS FUMARATE PO Take 5 tablets by mouth daily.   Yes Historical Provider, MD  pramipexole (MIRAPEX) 0.5 MG tablet Take 0.5 mg by mouth at bedtime.   Yes Historical Provider, MD  ALPRAZolam Duanne Moron) 1 MG tablet Take 1 mg by mouth every 6 (six) hours as needed. Anxiety/Restless Leg    Historical Provider, MD  cholecalciferol (VITAMIN D) 1000 UNITS tablet Take 1,000 Units by mouth 2 (two) times daily.    Historical Provider, MD  cilostazol (PLETAL) 100 MG tablet Take 100 mg by mouth daily.    Historical Provider, MD  cyclobenzaprine (FLEXERIL) 5 MG tablet Take 5 mg by mouth 2 (two) times daily.     Historical Provider, MD  insulin lispro protamine-insulin lispro (HUMALOG 50/50) (50-50) 100 UNIT/ML SUSP Inject 20-40 Units into the skin 3 (three) times daily. 40 units in AM 20 units at [facility] and 30 units in PM    Historical Provider, MD  metFORMIN (GLUCOPHAGE) 1000 MG tablet Take 1,000 mg by mouth 2 (two) times daily with a meal.    Historical Provider, MD  metoprolol (LOPRESSOR) 50 MG tablet Take 1 tablet (50 mg total) by mouth 2 (two) times daily. 04/19/12   Lendon Colonel, NP  Multiple Vitamin (MULTIVITAMIN WITH MINERALS)  TABS Take 1 tablet by mouth daily.    Historical Provider, MD  omega-3 acid ethyl esters (LOVAZA) 1 G capsule Take 1 g by mouth daily.     Historical Provider, MD  rOPINIRole (REQUIP) 2 MG tablet Take 2 mg by mouth at bedtime.    Historical Provider, MD  traMADol (ULTRAM) 50 MG tablet Take 50 mg by mouth 4 (four) times daily as needed. FOR PAIN    Historical Provider, MD  traZODone (DESYREL) 50 MG tablet Take 50-100 mg by mouth at bedtime.     Historical Provider, MD   Physical Exam: Filed Vitals:   10/07/12 2105 10/07/12 2230 10/07/12 2323 10/08/12 0027  BP: 134/49  103/43   Pulse: 114 109 100 96  Temp: 101 F (38.3 C)  98.6 F (37 C)   TempSrc: Oral  Oral   Resp: 20  20 20   Height:   5\' 10"  (1.778 m)   Weight:   118.6 kg (261 lb 7.5 oz)   SpO2: 94%  96% 96%     General:  Patient is extremely lethargic, she has difficulty staying awake and cooperating with the interview and exam. She is morbidly obese, appears mildly distressed and uncomfortable.  Eyes: Normal  ENT: Poor dentition dry mucous membranes  Neck: No thyromegaly or adenopathy  Cardiovascular: Tachycardic, regular rate and rhythm no murmurs rubs or gallops  Respiratory: Shallow inspiratory effort no wheezing or rhonchi  Abdomen: Obese active bowel sounds, ventral hernia  Skin: Pale, no rashes. There is muscle atrophy of her lower extremities below the knee on her right lower leg she has skin thickening with hyperemia starting at about mid anterior shin, her right foot is loosely wrapped in a Kerlix bandage, foot is warm to touch, bandage was removed and underneath was a Telfa attached to a 2 x 2 deep circular foot ulcer, foul-smelling wound drainage, and unhealthy looking tissue with central necrotic core. Patient without any sensation of pain in her lower extremities  Musculoskeletal: Or extremity atrophy  Psychiatric: Occult to assess she is extremely lethargic and has received pain medication  Neurologic: Nonfocal  Labs on Admission:  Basic Metabolic Panel:  Recent Labs Lab 10/07/12 2003  NA 133*  K 3.9  CL 95*  CO2 25  GLUCOSE 323*  BUN 22  CREATININE 1.09  CALCIUM 8.9   Liver Function Tests:  Recent Labs Lab 10/07/12 2003  AST 15  ALT 9  ALKPHOS 110  BILITOT 0.8  PROT 7.3  ALBUMIN 3.1*   No results found for this basename: LIPASE, AMYLASE,  in the last 168 hours No results found for this basename: AMMONIA,  in the last 168  hours CBC:  Recent Labs Lab 10/07/12 2003  WBC 14.3*  NEUTROABS 13.6*  HGB 11.2*  HCT 33.0*  MCV 82.9  PLT 233   Cardiac Enzymes: No results found for this basename: CKTOTAL, CKMB, CKMBINDEX, TROPONINI,  in the last 168 hours  BNP (last 3 results)  Recent Labs  01/15/12 1630  PROBNP 454.6*    Radiological Exams on Admission: Dg Chest Port 1 View  10/07/2012  *RADIOLOGY REPORT*  Clinical Data: Fever and shortness of breath.  PORTABLE CHEST - 1 VIEW  Comparison: 02/15/2012  Findings: Lungs are adequately inflated without focal consolidation or effusion.  There is mild stable cardiomegaly.  Remainder of the exam is unchanged.  IMPRESSION: No acute cardiopulmonary disease.  Mild stable cardiomegaly.   Original Report Authenticated By: Marin Olp, M.D.     EKG:  Independently reviewed. Pending at time of admission.  Assessment/Plan Active Problems:   Diabetes mellitus with neuropathy   OSA (obstructive sleep apnea)   Generalized weakness   Restless leg syndrome   Tachycardia   Hyperlipidemia   Hypertension   Morbid obesity   PVD (peripheral vascular disease)   Open wound of heel   Hematuria, microscopic   Hyponatremia   Osteomyelitis of right foot   UTI (lower urinary tract infection)   Diabetic foot ulcer    This patient is being admitted for further evaluation and management of fever, leukocytosis, and generalized weakness-now with 2 identified sources of infection including urinary tract infection with pyuria and clinical evidence of an infected right foot diabetic ulcer which has previously diagnosed osteomyelitis of the calcaneus. Despite IV antibiotics in the past and regular wound care she presents again with an active wound infection and possible systemic illness.   Started on empiric broad-spectrum antibiotics including vancomycin and Zosyn- these will cover both the urinary tract and the foot infection.  Will need another non-urgent surgical consultation,  unfortunately I suspect the only way that she will clear this osteomyelitis is to have a foot amputation.  Wound care consult placed. She is followed as an outpatient at [name].  Blood cultures obtained x2 prior history of bacteremia  No imaging of the extremity is needed at this time she has previously confirmed osteomyelitis.  For her multiple other chronic diseases, her home medications have been resumed.  Sliding scale insulin started for her diabetes  Placed a PICC line for stable IV access and probable need for future long-term IV antibiotics    Code Status:  Full Code, presumed. Family Communication: No family at bedside, discussed her condition and plans with her at bedside but she was quite sleepy. Disposition Plan: Patient admitted under inpatient status anticipate 3-5 days. Discharge plan to be determined.   Time spent: 70 minute  [facility] Pager [phone]  If 7PM-7AM, please contact night-coverage [email or web address] Password [email or web address] 10/08/2012, 4:23 AM

## 2012-10-08 NOTE — Consult Note (Signed)
ANTIBIOTIC CONSULT NOTE-Preliminary  Pharmacy Consult for Vancomycin and Zosyn Indication: Osteomyelitis  Patient Measurements: Height: 5\' 10"  (177.8 cm) Weight: 261 lb 7.5 oz (118.6 kg) IBW/kg (Calculated) : 68.5  Vital Signs: Temp: 98.6 F (37 C) (05/10 2323) Temp src: Oral (05/10 2323) BP: 103/43 mmHg (05/10 2323) Pulse Rate: 96 (05/11 0027)  Labs:  Recent Labs  10/07/12 2003  WBC 14.3*  HGB 11.2*  PLT 233  CREATININE 1.09    Estimated Creatinine Clearance: 70.9 ml/min (by C-G formula based on Cr of 1.09).  No results found for this basename: VANCOTROUGH, Corlis Leak, VANCORANDOM, GENTTROUGH, GENTPEAK, GENTRANDOM, TOBRATROUGH, TOBRAPEAK, TOBRARND, AMIKACINPEAK, AMIKACINTROU, AMIKACIN,  in the last 72 hours   Microbiology: Recent Results (from the past 720 hour(s))  CULTURE, BLOOD (ROUTINE X 2)     Status: None   Collection Time    10/07/12  8:03 PM      Result Value Range Status   Specimen Description LEFT ANTECUBITAL   Final   Special Requests BOTTLES DRAWN AEROBIC AND ANAEROBIC 8CC   Final   Culture PENDING   Incomplete   Report Status PENDING   Incomplete  CULTURE, BLOOD (ROUTINE X 2)     Status: None   Collection Time    10/07/12  8:09 PM      Result Value Range Status   Specimen Description BLOOD LEFT HAND   Final   Special Requests BOTTLES DRAWN AEROBIC AND ANAEROBIC [place]   Final   Culture PENDING   Incomplete   Report Status PENDING   Incomplete    Medical History: Past Medical History  Diagnosis Date  . CHF (congestive heart failure)   . Diabetes mellitus   . Restless leg syndrome 07/25/2011  . OSA (obstructive sleep apnea) 07/25/2011  . Wound, open     right foot  . Diabetes mellitus with neuropathy 07/25/2011  . Cellulitis and abscess of foot 02/12/2012  . Osteomyelitis of right foot 02/14/2012  . Partial Achilles tendon tear 02/14/2012    Medications:  Ceftriaxone 1 gm IV in the ED  Assessment: 67 yo female with chills, elevated temperature  and WBCs who is to be admitted for IV antibiotics due to chronic diabetic right foot ulcer and associated osteomyelitis.   Goal of Therapy:  Eradication of infection Vancomycin troughs 15-20 mcg/ml  Plan:  Preliminary review of pertinent patient information completed.  Protocol will be initiated with one-time doses of Zosyn 3.375 Gm IV and Vancomycin 2000 mg IV.  Forestine Na clinical pharmacist will complete review during morning rounds to assess patient and finalize treatment regimen.  Norberto Sorenson, [facility] 10/08/2012,4:50 AM

## 2012-10-08 NOTE — Progress Notes (Signed)
Paulina for Vancomycin and Zosyn Indication: Right foot ulcer infected, Osteomyelitis  No Known Allergies  Patient Measurements: Height: 5\' 10"  (177.8 cm) Weight: 261 lb 7.5 oz (118.6 kg) IBW/kg (Calculated) : 68.5  Vital Signs: Temp: 97.5 F (36.4 C) (05/11 [id number]) Temp src: Oral (05/11 0632) BP: 131/54 mmHg (05/11 [id number]) Pulse Rate: 93 (05/11 0632) Intake/Output from previous day: 05/10 0701 - 05/11 0700 In: 758.3 [I.V.:323.3; IV Piggyback:435] Out: 300 [Urine:300] Intake/Output from this shift:   Labs:  Recent Labs  10/07/12 2003 10/08/12 0545  WBC 14.3* 12.7*  HGB 11.2* 10.7*  PLT 233 207  CREATININE 1.09 1.27*   Estimated Creatinine Clearance: 60.9 ml/min (by C-G formula based on Cr of 1.27). No results found for this basename: VANCOTROUGH, Corlis Leak, VANCORANDOM, GENTTROUGH, GENTPEAK, GENTRANDOM, TOBRATROUGH, TOBRAPEAK, TOBRARND, AMIKACINPEAK, AMIKACINTROU, AMIKACIN,  in the last 72 hours   Microbiology: Recent Results (from the past 720 hour(s))  CULTURE, BLOOD (ROUTINE X 2)     Status: None   Collection Time    10/07/12  8:03 PM      Result Value Range Status   Specimen Description LEFT ANTECUBITAL   Final   Special Requests BOTTLES DRAWN AEROBIC AND ANAEROBIC 8CC   Final   Culture PENDING   Incomplete   Report Status PENDING   Incomplete  CULTURE, BLOOD (ROUTINE X 2)     Status: None   Collection Time    10/07/12  8:09 PM      Result Value Range Status   Specimen Description BLOOD LEFT HAND   Final   Special Requests BOTTLES DRAWN AEROBIC AND ANAEROBIC [place]   Final   Culture PENDING   Incomplete   Report Status PENDING   Incomplete    Medical History: Past Medical History  Diagnosis Date  . CHF (congestive heart failure)   . Diabetes mellitus   . Restless leg syndrome 07/25/2011  . OSA (obstructive sleep apnea) 07/25/2011  . Wound, open     right foot  . Diabetes mellitus with neuropathy 07/25/2011  . Cellulitis  and abscess of foot 02/12/2012  . Osteomyelitis of right foot 02/14/2012  . Partial Achilles tendon tear 02/14/2012    Medications:  Scheduled:  . [COMPLETED] acetaminophen  650 mg Oral Once  . aspirin EC  81 mg Oral Daily  . [COMPLETED] cefTRIAXone (ROCEPHIN) 1 g IVPB  1 g Intravenous Once  . cilostazol  100 mg Oral Daily  . [COMPLETED] cefTRIAXone (ROCEPHIN) IVPB 1 gram/50 mL D5W      . docusate sodium  100 mg Oral BID  . enoxaparin (LOVENOX) injection  40 mg Subcutaneous Q24H  . insulin aspart  0-15 Units Subcutaneous TID WC  . insulin aspart  0-5 Units Subcutaneous QHS  . insulin aspart  4 Units Subcutaneous TID WC  . insulin glargine  20 Units Subcutaneous Daily  . metoprolol  50 mg Oral BID  . multivitamin with minerals  1 tablet Oral Daily  . omega-3 acid ethyl esters  1 g Oral Daily  . piperacillin-tazobactam (ZOSYN)  IV  3.375 g Intravenous Once  . pramipexole  0.5 mg Oral QHS  . [COMPLETED] sodium chloride  500 mL Intravenous Once  . traZODone  50-100 mg Oral QHS  . vancomycin  1,000 mg Intravenous Once  . [COMPLETED] vancomycin  1,000 mg Intravenous Once  . vancomycin  1,000 mg Intravenous Once  . [DISCONTINUED] cefTRIAXone (ROCEPHIN)  IV  1 g Intravenous Q24H  . [DISCONTINUED] vancomycin  2,000 mg Intravenous Once   Assessment: Okay for Protocol Patient to receive a total 2000mg  load this AM. Estimated Creatinine Clearance: 60.9 ml/min (by C-G formula based on Cr of 1.27). Normalized CrCl = 49 ml/min  Goal of Therapy:  Vancomycin trough level 15-20 mcg/ml  Plan:  After Loading doses begin Vancomycin 1500mg  IV every 24 hours.Zosyn 3.375gm IV every 8 hours. Measure antibiotic drug levels at steady state Follow up culture results  Note: Lovenox dose adjust for increased BMI. Body mass index is 37.52 kg/(m^2).  Pricilla Larsson 10/08/2012,7:47 AM

## 2012-10-08 NOTE — Progress Notes (Signed)
CRITICAL VALUE ALERT  Critical value received:  Gram + cocci in clusters  Date of notification:  10/08/12  Time of notification:  [id number]  Critical value read back:yes  Nurse who received alert:  Jerrye Noble, RN  MD notified (1st page):  Anastasio Champion  Time of first page:  1235  MD notified (2nd page):  Time of second page:  Responding MD:  Anastasio Champion  Time MD responded:

## 2012-10-09 ENCOUNTER — Inpatient Hospital Stay (HOSPITAL_COMMUNITY): Payer: Medicare Other

## 2012-10-09 ENCOUNTER — Encounter (HOSPITAL_COMMUNITY): Payer: PRIVATE HEALTH INSURANCE

## 2012-10-09 DIAGNOSIS — R5381 Other malaise: Secondary | ICD-10-CM

## 2012-10-09 DIAGNOSIS — M869 Osteomyelitis, unspecified: Secondary | ICD-10-CM

## 2012-10-09 DIAGNOSIS — L97509 Non-pressure chronic ulcer of other part of unspecified foot with unspecified severity: Secondary | ICD-10-CM

## 2012-10-09 DIAGNOSIS — E1169 Type 2 diabetes mellitus with other specified complication: Secondary | ICD-10-CM

## 2012-10-09 LAB — GLUCOSE, CAPILLARY
Glucose-Capillary: 216 mg/dL — ABNORMAL HIGH (ref 70–99)
Glucose-Capillary: 174 mg/dL — ABNORMAL HIGH (ref 70–99)
Glucose-Capillary: 187 mg/dL — ABNORMAL HIGH (ref 70–99)

## 2012-10-09 LAB — URINE CULTURE: Colony Count: NO GROWTH

## 2012-10-09 LAB — BASIC METABOLIC PANEL
Chloride: 97 mEq/L (ref 96–112)
GFR calc Af Amer: 47 mL/min — ABNORMAL LOW (ref >90)
Potassium: 3.4 mEq/L — ABNORMAL LOW (ref 3.5–5.1)

## 2012-10-09 MED ORDER — SODIUM CHLORIDE 0.9 % IJ SOLN
10.0000 mL | Freq: Two times a day (BID) | INTRAMUSCULAR | Status: DC
  Administered 2012-10-09 – 2012-10-10 (×2): 10 mL

## 2012-10-09 MED ORDER — PRAMIPEXOLE DIHYDROCHLORIDE 1 MG PO TABS
1.0000 mg | ORAL_TABLET | Freq: Every day | ORAL | Status: DC
  Administered 2012-10-09: 1 mg via ORAL
  Filled 2012-10-09: qty 1

## 2012-10-09 MED ORDER — SODIUM CHLORIDE 0.9 % IJ SOLN
10.0000 mL | INTRAMUSCULAR | Status: DC | PRN
  Administered 2012-10-09: 10 mL

## 2012-10-09 MED ORDER — CADEXOMER IODINE 0.9 % EX GEL: CUTANEOUS | Status: DC

## 2012-10-09 MED ORDER — SODIUM CHLORIDE 0.9 % IV SOLN
INTRAVENOUS | Status: DC
  Administered 2012-10-09 – 2012-10-10 (×3): via INTRAVENOUS

## 2012-10-09 MED ORDER — PRAMIPEXOLE DIHYDROCHLORIDE 1 MG PO TABS
0.5000 mg | ORAL_TABLET | Freq: Every day | ORAL | Status: DC
  Administered 2012-10-09 – 2012-10-10 (×2): 0.5 mg via ORAL
  Filled 2012-10-09 (×2): qty 1

## 2012-10-09 MED ORDER — PRAMIPEXOLE DIHYDROCHLORIDE 0.25 MG PO TABS
1.0000 mg | ORAL_TABLET | Freq: Every day | ORAL | Status: DC
  Filled 2012-10-09 (×2): qty 4

## 2012-10-09 NOTE — Progress Notes (Addendum)
Iodosorb ordered for patient wound care but medication not avalable in pharmacy or materials. Wound RN paged twice and reconsulted with no answer at this time. Dr. Anastasio Champion notified and orders given to clean with NS and redress as previously ordered per wound RN.

## 2012-10-09 NOTE — Progress Notes (Signed)
Spoke with Melody, wound care nurse, she will clarify wound care order.

## 2012-10-09 NOTE — Progress Notes (Signed)
Inpatient Diabetes Program Recommendations  AACE/ADA: New Consensus Statement on Inpatient Glycemic Control (2013)  Target Ranges:  Prepandial:   less than 140 mg/dL      Peak postprandial:   less than 180 mg/dL (1-2 hours)      Critically ill patients:  140 - 180 mg/dL   Results for QUERIDA, STUKEY (MRN [id number]) as of 10/09/2012 07:54  Ref. Range 10/08/2012 00:01 10/08/2012 08:01 10/08/2012 11:49 10/08/2012 17:04 10/08/2012 21:01  Glucose-Capillary Latest Range: 70-99 mg/dL 294 (H) 264 (H) 220 (H) 195 (H) 224 (H)    Inpatient Diabetes Program Recommendations Correction (SSI): Please consider increasing Novolog correction scale to resistant.  Note: Patient has a history of diabetes and takes Humalog 50/50 40 units QAM, 20 units at noon daily, 30 units QPM, and Metformin 1000 mg BID at home for diabetes management.  Currently, patient is ordered to receive Lantus 30 units daily (which was increased this morning from 20 units daily), Novolog 0-15 units AC, Novolog 0-5 units HS, and Novolog 4 units TID for inpatient glycemic control.  Blood glucose over the past 24 hours has ranged from 195-294 mg/dl and fasting blood glucose this morning on labs was 202 mg/dl.  Please consider increasing Novolog correction to Resistant scale to improve glycemic control.  Will continue to follow.  Thanks, Barnie Alderman, RN, BSN, [place] [facility] Coordinator Inpatient Diabetes Program [phone]

## 2012-10-09 NOTE — Progress Notes (Addendum)
Subjective: This lady came in with fever. The etiology of this is probably a combination of UTI and infected diabetic foot ulcer in the right heel. Previously, the right heel ulcer was severely infected and has improved significantly as an outpatient wound care center in [place]. Blood cultures are coming back showing gram-positive cocci in clusters, this is probably staph aureus. Will wait to see if this is MRSA or methicillin sensitive. Today she feels better although I see that she did have fever yesterday.           Physical Exam: Blood pressure 108/62, pulse 87, temperature 98.1 F (36.7 C), temperature source Oral, resp. rate 20, height 5\' 10"  (1.778 m), weight 118.6 kg (261 lb 7.5 oz), last menstrual period 07/25/2011, SpO2 93.00%. She does look systemically well. She is not toxic or septic. Heart sounds are present and normal. Lung fields are clear. She is alert and oriented .   Investigations:  Recent Results (from the past 240 hour(s))  CULTURE, BLOOD (ROUTINE X 2)     Status: None   Collection Time    10/07/12  8:03 PM      Result Value Range Status   Specimen Description LEFT ANTECUBITAL   Final   Special Requests BOTTLES DRAWN AEROBIC AND ANAEROBIC 8CC   Final   Culture  Setup Time     Final   Value: GRAM POSITIVE COCCI IN CLUSTERS WBC PRESENT,BOTH PMN AND MONONUCLEAR     Gram Stain Report Called to,Read Back By and Verified With: BULLINS, M AT 12:15PM ON 10/08/12 BY PRUITT, C.   Culture PENDING   Incomplete   Report Status PENDING   Incomplete  CULTURE, BLOOD (ROUTINE X 2)     Status: None   Collection Time    10/07/12  8:09 PM      Result Value Range Status   Specimen Description BLOOD LEFT HAND   Final   Special Requests BOTTLES DRAWN AEROBIC AND ANAEROBIC 6CC   Final   Culture  Setup Time     Final   Value: GRAM POSITIVE COCCI IN CLUSTERS WBC PRESENT,BOTH PMN AND MONONUCLEAR     Gram Stain Report Called to,Read Back By and Verified With: BULLINS, M. AT  12:15PM ON 10/08/12 BY PRUITT, C.   Culture PENDING   Incomplete   Report Status PENDING   Incomplete     Basic Metabolic Panel:  Recent Labs  10/08/12 0545 10/09/12 0549  NA 132* 133*  K 3.7 3.4*  CL 95* 97  CO2 27 25  GLUCOSE 271* 202*  BUN 27* 29*  CREATININE 1.27* 1.34*  CALCIUM 8.3* 7.7*   Liver Function Tests:  Recent Labs  10/07/12 2003  AST 15  ALT 9  ALKPHOS 110  BILITOT 0.8  PROT 7.3  ALBUMIN 3.1*     CBC:  Recent Labs  10/07/12 2003 10/08/12 0545  WBC 14.3* 12.7*  NEUTROABS 13.6*  --   HGB 11.2* 10.7*  HCT 33.0* 31.6*  MCV 82.9 83.4  PLT 233 207    Dg Chest Port 1 View  10/07/2012  *RADIOLOGY REPORT*  Clinical Data: Fever and shortness of breath.  PORTABLE CHEST - 1 VIEW  Comparison: 02/15/2012  Findings: Lungs are adequately inflated without focal consolidation or effusion.  There is mild stable cardiomegaly.  Remainder of the exam is unchanged.  IMPRESSION: No acute cardiopulmonary disease.  Mild stable cardiomegaly.   Original Report Authenticated By: Marin Olp, M.D.  Medications: I have reviewed the patient's current medications.  Impression: 1. Fever secondary to UTI and infected right heel diabetic foot ulcer. Gram-positive bacteremia. 2. Hypertension. 3. Morbid obesity. 4. Diabetes mellitus with neuropathy. 5. Acute renal failure secondary to mild dehydration, worsening.     Plan: 1. Increase IV fluid rate. She will need PICC line for  prolonged antibiotic treatment.. 2. Continue with intravenous antibiotics. Await identification of the gram-positive cocci in clusters. 3. Wound care consult.     LOS: 2 days   Doree Albee Pager [phone]  10/09/2012, 7:47 AM

## 2012-10-09 NOTE — Progress Notes (Signed)
      [facility]icrobial Management Team Staphylococcus aureus bacteremia   Staphylococcus aureus bacteremia (SAB) is associated with a high rate of complications and mortality.  Specific aspects of clinical management are critical to optimizing the outcome of patients with SAB.  Therefore, the [facility] Antimicrobial Management Team [facility]) has initiated an intervention aimed at improving the management of SAB at [facility].  To do so, Infectious Diseases physicians are providing an evidence-based consult for the management of all patients with SAB.     Yes No Comments  Perform follow-up blood cultures (even if the patient is afebrile) to ensure clearance of bacteremia [x]  []  Will order them today on 10/09/12  Remove vascular catheter and obtain follow-up blood cultures after the removal of the catheter []  []  If she still has positive blood cx from 5/12, her picc line will have to be removed. It was placed on 5/12 at 10 am( just prior to admit blood cx turning positive)  Perform echocardiography to evaluate for endocarditis (transthoracic ECHO is 40-50% sensitive, TEE is > 90% sensitive) Please order TEE []  [x]  Please keep in mind, that neither test can definitively EXCLUDE endocarditis, and that should clinical suspicion remain high for endocarditis the patient should then still be treated with an "endocarditis" duration of therapy = 6 weeks       Ensure source control May need to consider repeat imaging of diabetic foot ulcer to ensure no deep tissue infections that need to be drained []  [x]  Have all abscesses been drained effectively? Have deep seeded infections (septic joints or osteomyelitis) had appropriate surgical debridement?  Investigate for "metastatic" sites of infection []  []  Does the patient have ANY symptom or physical exam finding that would suggest a deeper infection (back or neck pain that may be suggestive of vertebral osteomyelitis or epidural abscess, muscle pain that  could be a symptom of pyomyositis)?  Keep in mind that for deep seeded infections MRI imaging with contrast is preferred rather than other often insensitive tests such as plain x-rays, especially early in a patient's presentation.  Change antibiotic therapy to - vancomycin plus cefazolin 2gm IV Q8hr for now until culture results return - please discontinue piptazo []  []  Beta-lactam antibiotics are preferred for MSSA due to higher cure rates.   If on Vancomycin, goal trough should be 15 - 20 mcg/mL  Estimated duration of IV antibiotic therapy:  4-6 wks, as we will also likely be treating osteomyelitis if imaging is worse []  []  Consult case management for probably prolonged outpatient IV antibiotic therapy   Mary [name] is 67yo f with poorly controlled DM, c/b diabetic foot ulcer/calcenous osteo in sep 2013 now presents with fever, leukocytosis has GPC bacteremia in 4/4 bottles on 5/10. Placed on vanco and piptazo with decrease in fevers and WBC. Picc line placed on 5/12 morning but have not documented clearance of bacteremia. May need prolonged IV Antibiotics if consider retreating for osteomyelitis. Interestingly in the fall of 2013, she had microaerophilic strep bacteremia thought to be related to diabetic foot ulcer/calcaneous osteo. Await ID on blood culture pathogen. Recommend to treat with vanco and cefazolin for now.   If questions, please don't hesitate to contact  Tomer Chalmers B. [place] for Infectious Diseases [phone] pager [phone] cell

## 2012-10-09 NOTE — Progress Notes (Signed)
UR Chart Review Completed  

## 2012-10-09 NOTE — Consult Note (Addendum)
WOC consult Note Reason for Consult: Chronic ulcer on right heel.  Patient followed by outpatient wound care center and Dr. Nils Pyle. Wound type:Neuropathic ulceration of 8 months duration Pressure Ulcer POA: No Measurement:2cm x 2.5cm x .5cm  Wound [id number], pink, granulating Drainage (amount, consistency, odor) scant amount of serous drainage on old dresing Periwound:intact with recently pared callus. No induration, no increase in exudate, no warmth. Dressing procedure/placement/frequency: I recommend continuation of outpatient regimen:  Cadexomer iodine gel in wound bed three times weekly and PRN for strike-though onto external layers of dressing.  Off-loading at all times.  Follow up with Dr. Nils Pyle upon discharge from acute care.  Discharge to care of competent caregiver (husband) when medically appropriate for other systems. Additionally, I have provided a off-loading boot for use in bed as patient pushes down on heels for repositioning while in bed. I will not follow, but will remain available as needed.  Please re-consult if needed. Thanks, Maudie Flakes, MSN, RN, [facility], [place] [phone]) Addendum:  Contacted by pharmacy as topical therapy is not available.  Will order NS damp-to-dry until patient is home and MD from outpatient center can order wound care.

## 2012-10-10 DIAGNOSIS — R7881 Bacteremia: Principal | ICD-10-CM

## 2012-10-10 DIAGNOSIS — I369 Nonrheumatic tricuspid valve disorder, unspecified: Secondary | ICD-10-CM

## 2012-10-10 DIAGNOSIS — I10 Essential (primary) hypertension: Secondary | ICD-10-CM

## 2012-10-10 DIAGNOSIS — E119 Type 2 diabetes mellitus without complications: Secondary | ICD-10-CM

## 2012-10-10 DIAGNOSIS — A4901 Methicillin susceptible Staphylococcus aureus infection, unspecified site: Secondary | ICD-10-CM

## 2012-10-10 LAB — CULTURE, BLOOD (ROUTINE X 2)

## 2012-10-10 LAB — BASIC METABOLIC PANEL
CO2: 25 mEq/L (ref 19–32)
Calcium: 7.8 mg/dL — ABNORMAL LOW (ref 8.4–10.5)
GFR calc non Af Amer: 53 mL/min — ABNORMAL LOW (ref >90)
Glucose, Bld: 213 mg/dL — ABNORMAL HIGH (ref 70–99)
Potassium: 3.5 mEq/L (ref 3.5–5.1)
Sodium: 136 mEq/L (ref 135–145)

## 2012-10-10 LAB — GLUCOSE, CAPILLARY: Glucose-Capillary: 201 mg/dL — ABNORMAL HIGH (ref 70–99)

## 2012-10-10 MED ORDER — LEVOFLOXACIN IN D5W 500 MG/100ML IV SOLN: INTRAVENOUS | Status: DC

## 2012-10-10 MED ORDER — CEFAZOLIN SODIUM-DEXTROSE 2-3 GM-% IV SOLR
2.0000 g | Freq: Three times a day (TID) | INTRAVENOUS | Status: DC
  Administered 2012-10-10: 2 g via INTRAVENOUS
  Filled 2012-10-10 (×4): qty 50

## 2012-10-10 MED ORDER — LEVOFLOXACIN IN D5W 500 MG/100ML IV SOLN
500.0000 mg | INTRAVENOUS | Status: DC
  Filled 2012-10-10: qty 100

## 2012-10-10 MED ORDER — CEFAZOLIN SODIUM-DEXTROSE 2-3 GM-% IV SOLR: INTRAVENOUS | Status: DC

## 2012-10-10 NOTE — Progress Notes (Signed)
*  PRELIMINARY RESULTS* Echocardiogram 2D Echocardiogram has been performed.  Mary [name] 10/10/2012, 11:30 AM

## 2012-10-10 NOTE — Progress Notes (Signed)
UR chart review completed.  

## 2012-10-10 NOTE — Progress Notes (Signed)
Inpatient Diabetes Program Recommendations  AACE/ADA: New Consensus Statement on Inpatient Glycemic Control (2013)  Target Ranges:  Prepandial:   less than 140 mg/dL      Peak postprandial:   less than 180 mg/dL (1-2 hours)      Critically ill patients:  140 - 180 mg/dL   Results for Mary [name], Mary [name] (MRN [id number]) as of 10/10/2012 08:00  Ref. Range 10/09/2012 07:50 10/09/2012 12:10 10/09/2012 16:43 10/09/2012 21:14 10/10/2012 07:46  Glucose-Capillary Latest Range: 70-99 mg/dL 216 (H) 174 (H) 187 (H) 203 (H) 201 (H)    Inpatient Diabetes Program Recommendations Insulin - Basal: Please consider increasing Lantus to 32 units daily. Correction (SSI): Please consider increasing Novolog correction scale to resistant.  Note: Blood glucose over the past 24 hours has ranged from 174-216 mg/dl and fasting blood glucose this morning was 201 mg/dl.  Please consider increasing Lantus to 32 units daily and increasing Novolog correction scale to resistant.  Will continue to follow.  Thanks, Barnie Alderman, RN, BSN, [place] [facility] Coordinator Inpatient Diabetes Program [phone]

## 2012-10-10 NOTE — Discharge Summary (Signed)
Physician Discharge Summary  Mary [name] [id number] DOB: 1946/01/05 DOA: 10/07/2012  PCP: Wende Neighbors, MD  Admit date: 10/07/2012 Discharge date: 10/10/2012  Time spent: Greater than 30 minutes  Recommendations for Outpatient Follow-up:  1. Followup with infectious disease clinic in one month. 2. Echocardiogram result pending.  Discharge Diagnoses:   1. Staphylococcus aureus bacteremia. Secondary to diabetic foot ulcer. 2. Diabetic foot ulcer, undergoing local treatment. 3. Diabetes mellitus with neuropathy. 4. Hypertension. 5. Acute renal failure secondary to moderate dehydration, resolved. 6. Morbid obesity.   Discharge Condition: Stable. Improved.  Diet recommendation: Carbohydrate modified diet.  Filed Weights   10/07/12 1931 10/07/12 2323  Weight: 116.121 kg (256 lb) 118.6 kg (261 lb 7.5 oz)    History of present illness:  This 67 year old lady presents to the hospital with symptoms of fever. Please see initial history as outlined below: HPI: Mary [name] is a 67 y.o. female with multiple chronic medical problems including poorly controlled diabetes, CHF, sleep apnea, hypertension and morbid obesity who has been undergoing wound center management for a chronic right heel diabetic foot ulcer that has associated calcaneal osteomyelitis which was diagnosed September 2013. She has severe peripheral neuropathy in her lower extremities as well is peripheral vascular disease. At the time of her last admission in September 2013 she had an associated strep bacteremia and surgery was consult obtained and there was a discussion regarding amputation given the severity of the wound, but a decision was made to first attempt conservative management with several weeks of IV antibiotics and wound care.  Her family brought her into the emergency department this evening with a reported fever of 103, increasing weakness and hypertension. She denies any chest pain, nausea vomiting or  diarrhea. She denies any dysuria but does have urinary incontinence at baseline, increased frequency. Denies lower abdominal pain.   Hospital Course:  Patient was admitted and started on intravenous vancomycin and Zosyn empirically. Blood cultures show staph aureus. She had local wound care consultation and treatment for this right diabetic foot. Urine culture will ask the was negative. She has done extremely well and feels much better go home. Because blood cultures are positive for staph aureus, and she will need echocardiogram before she goes and will need intravenous Ancef 2 g every 8 hours for 4 weeks. Of course if the echocardiogram shows something abnormal, she will need followup for this. We will   arrange followup for her to be seen in the infectious disease clinic in a month's time. Blood cultures will be taken again prior to discharge.  Procedures:  None. Echocardiogram to be done prior to discharge.   Consultations:  Infectious disease consultation by telephone.  Discharge Exam: Filed Vitals:   10/07/12 2323 10/09/12 0529 10/09/12 2110 10/10/12 0609  BP:  108/62 132/72 124/64  Pulse:  87 85 85  Temp:  98.1 F (36.7 C) 98.3 F (36.8 C) 98.9 F (37.2 C)  TempSrc:  Oral Oral Oral  Resp:  20 20 20   Height: 5\' 10"  (1.778 m)     Weight: 118.6 kg (261 lb 7.5 oz)     SpO2:  93% 98% 93%    General: She looks systemically well. She is not toxic or septic. Cardiovascular: Heart sounds are present and normal and without any murmurs. Respiratory: Lung fields are clear. She is alert and orientated.  Discharge Instructions  Discharge Orders   Future Orders Complete By Expires     Diet - low sodium heart healthy  As directed  Increase activity slowly  As directed         Medication List    TAKE these medications       ALPRAZolam 1 MG tablet  Commonly known as:  XANAX  Take 1 mg by mouth every 6 (six) hours as needed. Anxiety/Restless Leg     ceFAZolin 2-3 GM-% Solr   Commonly known as:  ANCEF  2 g every 8 hours for 4 weeks.     cholecalciferol 1000 UNITS tablet  Commonly known as:  VITAMIN D  Take 1,000 Units by mouth 2 (two) times daily.     cilostazol 100 MG tablet  Commonly known as:  PLETAL  Take 100 mg by mouth daily.     cyclobenzaprine 5 MG tablet  Commonly known as:  FLEXERIL  Take 5 mg by mouth 2 (two) times daily.     FERROUS FUMARATE PO  Take 5 tablets by mouth daily.     insulin lispro protamine-lispro (50-50) 100 UNIT/ML Susp  Commonly known as:  HUMALOG 50/50  Inject 20-40 Units into the skin 3 (three) times daily. 40 units in AM 20 units at Noon and 30 units in PM     metFORMIN 1000 MG tablet  Commonly known as:  GLUCOPHAGE  Take 1,000 mg by mouth 2 (two) times daily with a meal.     metoprolol 50 MG tablet  Commonly known as:  LOPRESSOR  Take 1 tablet (50 mg total) by mouth 2 (two) times daily.     multivitamin with minerals Tabs  Take 1 tablet by mouth daily.     omega-3 acid ethyl esters 1 G capsule  Commonly known as:  LOVAZA  Take 1 g by mouth daily.     pramipexole 0.5 MG tablet  Commonly known as:  MIRAPEX  Take 0.5 mg by mouth at bedtime.     rOPINIRole 2 MG tablet  Commonly known as:  REQUIP  Take 2 mg by mouth at bedtime.     traMADol 50 MG tablet  Commonly known as:  ULTRAM  Take 50 mg by mouth 4 (four) times daily as needed. FOR PAIN     traZODone 50 MG tablet  Commonly known as:  DESYREL  Take 50-100 mg by mouth at bedtime.       No Known Allergies    The results of significant diagnostics from this hospitalization (including imaging, microbiology, ancillary and laboratory) are listed below for reference.    Significant Diagnostic Studies: Dg Chest Port 1 View  10/09/2012  *RADIOLOGY REPORT*  Clinical Data: 67 year old female PICC line placement.  PORTABLE CHEST - 1 VIEW  Comparison: 10/07/2012 and earlier.  Findings: Portable upright AP view at 0957 hours.  Right side PICC line placed.   Tip at the level of the lower SVC or just at the superior cavoatrial junction.  Mildly lower lung volumes for this image.  Mildly increased streaky bibasilar opacity.  The  Stable cardiac size and mediastinal contours.  Visualized tracheal air column is within normal limits.  No other new pulmonary opacity.  IMPRESSION: Right side PICC line, tip at the lower SVC level.   Original Report Authenticated By: Roselyn Reef, M.D.    Dg Chest Port 1 View  10/07/2012  *RADIOLOGY REPORT*  Clinical Data: Fever and shortness of breath.  PORTABLE CHEST - 1 VIEW  Comparison: 02/15/2012  Findings: Lungs are adequately inflated without focal consolidation or effusion.  There is mild stable cardiomegaly.  Remainder of the exam is  unchanged.  IMPRESSION: No acute cardiopulmonary disease.  Mild stable cardiomegaly.   Original Report Authenticated By: Marin Olp, M.D.     Microbiology: Recent Results (from the past 240 hour(s))  CULTURE, BLOOD (ROUTINE X 2)     Status: None   Collection Time    10/07/12  8:03 PM      Result Value Range Status   Specimen Description BLOOD LEFT ANTECUBITAL   Final   Special Requests BOTTLES DRAWN AEROBIC AND ANAEROBIC 8CC   Final   Culture  Setup Time 10/08/2012 11:20   Final   Culture     Final   Value: STAPHYLOCOCCUS AUREUS     Note: SUSCEPTIBILITIES PERFORMED ON PREVIOUS CULTURE WITHIN THE LAST 5 DAYS.     Note: Gram Stain Report Called to,Read Back By and Verified With: BULLINS M 1215 10/08/12 BY PRUITT C Performed at [facility]   Report Status 10/10/2012 FINAL   Final  CULTURE, BLOOD (ROUTINE X 2)     Status: None   Collection Time    10/07/12  8:09 PM      Result Value Range Status   Specimen Description BLOOD LEFT HAND   Final   Special Requests BOTTLES DRAWN AEROBIC AND ANAEROBIC [facility]   Final   Culture  Setup Time 10/08/2012 15:15   Final   Culture     Final   Value: STAPHYLOCOCCUS AUREUS     Note: RIFAMPIN AND GENTAMICIN SHOULD NOT BE USED AS SINGLE DRUGS FOR  TREATMENT OF STAPH INFECTIONS.     Note: Gram Stain Report Called to,Read Back By and Verified With: BULLINS M 1215 10/08/12 BY PRUITT C Performed at [facility]   Report Status 10/10/2012 FINAL   Final   Organism ID, Bacteria STAPHYLOCOCCUS AUREUS   Final  URINE CULTURE     Status: None   Collection Time    10/07/12  9:34 PM      Result Value Range Status   Specimen Description URINE, CATHETERIZED   Final   Special Requests NONE   Final   Culture  Setup Time 10/08/2012 19:39   Final   Colony Count NO GROWTH   Final   Culture NO GROWTH   Final   Report Status 10/09/2012 FINAL   Final     Labs: Basic Metabolic Panel:  Recent Labs Lab 10/07/12 2003 10/08/12 0545 10/09/12 0549 10/10/12 0441  NA 133* 132* 133* 136  K 3.9 3.7 3.4* 3.5  CL 95* 95* 97 103  CO2 25 27 25 25   GLUCOSE 323* 271* 202* 213*  BUN 22 27* 29* 21  CREATININE 1.09 1.27* 1.34* 1.07  CALCIUM 8.9 8.3* 7.7* 7.8*   Liver Function Tests:  Recent Labs Lab 10/07/12 2003  AST 15  ALT 9  ALKPHOS 110  BILITOT 0.8  PROT 7.3  ALBUMIN 3.1*     CBC:  Recent Labs Lab 10/07/12 2003 10/08/12 0545  WBC 14.3* 12.7*  NEUTROABS 13.6*  --   HGB 11.2* 10.7*  HCT 33.0* 31.6*  MCV 82.9 83.4  PLT 233 207     BNP: BNP (last 3 results)  Recent Labs  01/15/12 1630  PROBNP 454.6*   CBG:  Recent Labs Lab 10/09/12 0750 10/09/12 1210 10/09/12 1643 10/09/12 2114 10/10/12 0746  GLUCAP 216* 174* 187* 203* 201*       Signed:  [place]  [facility] 10/10/2012, 9:08 AM

## 2012-10-10 NOTE — Progress Notes (Signed)
SAB management- infectious disease recommendations  Ms. Totzke blood cultures from 5/10 are showing MSSA.  - Her antibiotics have been changed to cefazolin 2gm IV Q8hr. - Repeat blood cultures have been ordered for today - recommend TTE to evaluate for endovascular source - recommend to discontinue cipro if it is for a urinary source since urine cultures thus far are negative   Artemisa Sladek B. [place] for Infectious Diseases [phone]

## 2012-10-10 NOTE — Progress Notes (Addendum)
Patient states understanding of discharge,prescriptions given, picc line flushed. Wet to dry dressing change to right heel and prevalon boot replaced.

## 2012-10-11 LAB — GLUCOSE, CAPILLARY

## 2012-10-11 NOTE — Care Management Note (Signed)
    Page 1 of 1   10/11/2012     3:18:27 PM   CARE MANAGEMENT NOTE 10/11/2012  Patient:  Mary [name], Mary [name]   Account Number:  [id number]  Date Initiated:  10/10/2012  Documentation initiated by:  Vladimir Creeks  Subjective/Objective Assessment:   Admitted with diabetic foot ulcer and UTI, on IV ABX, with PICC line and will go home on IV ABX. Lives with spouse and states he has done her IV ABX before and can do this again     Action/Plan:   patient states she has used [facility] in the past and would like to use them again   Anticipated DC Date:  10/10/2012   Anticipated DC Plan:  [place]  CM consult      [place]   Choice offered to / List presented to:  C-1 Patient        [place] arranged  HH-1 RN      [place].   Status of service:  Completed, signed off Medicare Important Message given?  YES (If response is "NO", the following Medicare IM given date fields will be blank) Date Medicare IM given:  10/10/2012 Date Additional Medicare IM given:    Discharge Disposition:  [place]  Per UR Regulation:  Reviewed for med. necessity/level of care/duration of stay  If discussed at [place] of Stay Meetings, dates discussed:    Comments:  10/10/12 [place] RN patient being discharged home today with home health to follow for IV antibiotics and monitor wound care as needed. Home health to start this afternoon.

## 2012-10-15 LAB — CULTURE, BLOOD (ROUTINE X 2): Culture: NO GROWTH

## 2012-11-07 ENCOUNTER — Inpatient Hospital Stay: Payer: PRIVATE HEALTH INSURANCE | Admitting: Infectious Diseases

## 2012-11-10 ENCOUNTER — Ambulatory Visit (INDEPENDENT_AMBULATORY_CARE_PROVIDER_SITE_OTHER): Payer: Medicare Other | Admitting: Infectious Diseases

## 2012-11-10 ENCOUNTER — Encounter: Payer: Self-pay | Admitting: Infectious Diseases

## 2012-11-10 VITALS — BP 149/76 | HR 89 | Temp 97.8°F | Ht 70.0 in | Wt 266.0 lb

## 2012-11-10 DIAGNOSIS — E119 Type 2 diabetes mellitus without complications: Secondary | ICD-10-CM

## 2012-11-10 DIAGNOSIS — A4901 Methicillin susceptible Staphylococcus aureus infection, unspecified site: Secondary | ICD-10-CM

## 2012-11-10 DIAGNOSIS — R7881 Bacteremia: Secondary | ICD-10-CM

## 2012-11-10 DIAGNOSIS — M869 Osteomyelitis, unspecified: Secondary | ICD-10-CM

## 2012-11-10 MED ORDER — LEVOFLOXACIN 500 MG PO TABS: 500.0000 mg | ORAL_TABLET | Freq: Every day | ORAL | Status: AC

## 2012-11-10 NOTE — Progress Notes (Signed)
  Subjective:    Patient ID: Mary [name], female    DOB: 10-Mar-1946, 67 y.o.   MRN: [id number]  HPI 67 yo F with hx of DM (poorly controlled, > 10 yr), HTN, OSA, obesity and chronic R heel wound with osteomyelitis dx Sept 2013 (she states she has had this wound > 1 yr). She had streptococcal bacteremia at that time.  She was admitted to [facility] 10-07-12 with fever and weakness. She was started on vanco/zosyn and was eval by [facility]OC (she previously has Therapist, sports). She was found to have MSSA (R-PEN) bacteremia, had a normal TTE (mod LVH EF 50%) and was d/c home on Ancef. Repeat BCx 5-13 (-). She received 4 weeks of anbx (completed 11-07-12). She had Bx showing osteomyelitis and periostitis on 11-02-12. This was sent for Cx which grew E cloacae (R-cefazolin). She is scheduled to be started on hyperaric O2. Continues to have d/c from her foot, bone is visible. No fever or chills.  FSG have been under 200. Has been watching her diet.  No problems with PIC line.    Review of Systems  Constitutional: Negative for fever, chills, appetite change and unexpected weight change.  Gastrointestinal: Negative for diarrhea and constipation.  Genitourinary: Negative for difficulty urinating.  Has had eye exam this year, no change in vision.  see HPI.      Objective:   Physical Exam  Constitutional: She appears well-developed and well-nourished.  HENT:  Mouth/Throat: No oropharyngeal exudate.  Eyes: EOM are normal. Pupils are equal, round, and reactive to light.  Neck: Neck supple.  Cardiovascular: Normal rate, regular rhythm and normal heart sounds.   Pulmonary/Chest: Effort normal and breath sounds normal.  Abdominal: Soft. Bowel sounds are normal. There is no tenderness. There is no rebound.  Musculoskeletal:       Arms:      Feet:  Lymphadenopathy:    She has no cervical adenopathy.  Neurological: No sensory deficit.          Assessment & Plan:

## 2012-11-10 NOTE — Assessment & Plan Note (Signed)
Will recheck her BCx and pull her PIC.

## 2012-11-10 NOTE — Assessment & Plan Note (Signed)
I explained to her that this is a chronic wound, that she has had bacteremia twice and that there is a 50/50 chance (being optimistic) that she will lose her foot. Will pull her PIC and start her on levaquin po for the foreseeable future. I gave her precautions that she should not take this rx with cations, and should space this 2 hours from these.  Will see her back in 1 month.

## 2012-11-10 NOTE — Assessment & Plan Note (Signed)
Control of her DM will be eky to any possible healing, she is trying to improve this.

## 2012-11-13 ENCOUNTER — Other Ambulatory Visit: Payer: Self-pay | Admitting: Infectious Diseases

## 2012-11-13 ENCOUNTER — Other Ambulatory Visit: Payer: Medicare Other

## 2012-11-13 DIAGNOSIS — E1169 Type 2 diabetes mellitus with other specified complication: Secondary | ICD-10-CM

## 2012-11-23 ENCOUNTER — Encounter (HOSPITAL_COMMUNITY): Payer: Self-pay | Admitting: Pharmacy Technician

## 2012-11-24 ENCOUNTER — Other Ambulatory Visit (HOSPITAL_COMMUNITY): Payer: Self-pay | Admitting: Orthopedic Surgery

## 2012-11-24 ENCOUNTER — Encounter (HOSPITAL_COMMUNITY): Payer: Self-pay | Admitting: *Deleted

## 2012-11-24 MED ORDER — MUPIROCIN 2 % EX OINT: TOPICAL_OINTMENT | Freq: Two times a day (BID) | CUTANEOUS | Status: DC

## 2012-11-24 MED ORDER — DEXTROSE 5 % IV SOLN
3.0000 g | INTRAVENOUS | Status: AC
  Administered 2012-11-25: 3 g via INTRAVENOUS
  Filled 2012-11-24: qty 3000

## 2012-11-25 ENCOUNTER — Encounter (HOSPITAL_COMMUNITY): Payer: Self-pay | Admitting: Anesthesiology

## 2012-11-25 ENCOUNTER — Inpatient Hospital Stay (HOSPITAL_COMMUNITY): Payer: Medicare Other | Admitting: Anesthesiology

## 2012-11-25 ENCOUNTER — Encounter (HOSPITAL_COMMUNITY)
Admission: RE | Disposition: A | Discharge status: Skilled Nursing Facility | Payer: Self-pay | Source: Ambulatory Visit | Attending: Orthopedic Surgery

## 2012-11-25 ENCOUNTER — Observation Stay (HOSPITAL_COMMUNITY)
Admission: RE | Admit: 2012-11-25 | Discharge: 2012-11-28 | DRG: 617 | Disposition: A | Discharge status: Skilled Nursing Facility | Payer: Medicare Other | Source: Ambulatory Visit | Attending: Orthopedic Surgery | Admitting: Orthopedic Surgery

## 2012-11-25 DIAGNOSIS — I509 Heart failure, unspecified: Secondary | ICD-10-CM | POA: Insufficient documentation

## 2012-11-25 DIAGNOSIS — Z794 Long term (current) use of insulin: Secondary | ICD-10-CM | POA: Insufficient documentation

## 2012-11-25 DIAGNOSIS — D649 Anemia, unspecified: Secondary | ICD-10-CM | POA: Insufficient documentation

## 2012-11-25 DIAGNOSIS — M86671 Other chronic osteomyelitis, right ankle and foot: Secondary | ICD-10-CM

## 2012-11-25 DIAGNOSIS — E1169 Type 2 diabetes mellitus with other specified complication: Principal | ICD-10-CM | POA: Insufficient documentation

## 2012-11-25 DIAGNOSIS — E1159 Type 2 diabetes mellitus with other circulatory complications: Secondary | ICD-10-CM | POA: Insufficient documentation

## 2012-11-25 DIAGNOSIS — E1149 Type 2 diabetes mellitus with other diabetic neurological complication: Secondary | ICD-10-CM | POA: Insufficient documentation

## 2012-11-25 DIAGNOSIS — G4733 Obstructive sleep apnea (adult) (pediatric): Secondary | ICD-10-CM | POA: Insufficient documentation

## 2012-11-25 DIAGNOSIS — I10 Essential (primary) hypertension: Secondary | ICD-10-CM | POA: Insufficient documentation

## 2012-11-25 DIAGNOSIS — M9080 Osteopathy in diseases classified elsewhere, unspecified site: Secondary | ICD-10-CM | POA: Insufficient documentation

## 2012-11-25 DIAGNOSIS — M86679 Other chronic osteomyelitis, unspecified ankle and foot: Secondary | ICD-10-CM | POA: Insufficient documentation

## 2012-11-25 DIAGNOSIS — L97409 Non-pressure chronic ulcer of unspecified heel and midfoot with unspecified severity: Secondary | ICD-10-CM | POA: Insufficient documentation

## 2012-11-25 DIAGNOSIS — G2581 Restless legs syndrome: Secondary | ICD-10-CM | POA: Insufficient documentation

## 2012-11-25 DIAGNOSIS — I798 Other disorders of arteries, arterioles and capillaries in diseases classified elsewhere: Secondary | ICD-10-CM | POA: Insufficient documentation

## 2012-11-25 DIAGNOSIS — M129 Arthropathy, unspecified: Secondary | ICD-10-CM | POA: Insufficient documentation

## 2012-11-25 DIAGNOSIS — E1142 Type 2 diabetes mellitus with diabetic polyneuropathy: Secondary | ICD-10-CM | POA: Insufficient documentation

## 2012-11-25 HISTORY — PX: AMPUTATION: SHX166

## 2012-11-25 HISTORY — DX: Anemia, unspecified: D64.9

## 2012-11-25 HISTORY — DX: Unspecified osteoarthritis, unspecified site: M19.90

## 2012-11-25 HISTORY — DX: Cardiac arrhythmia, unspecified: I49.9

## 2012-11-25 HISTORY — PX: BELOW KNEE LEG AMPUTATION: SUR23

## 2012-11-25 LAB — GLUCOSE, CAPILLARY
Glucose-Capillary: 152 mg/dL — ABNORMAL HIGH (ref 70–99)
Glucose-Capillary: 177 mg/dL — ABNORMAL HIGH (ref 70–99)

## 2012-11-25 LAB — COMPREHENSIVE METABOLIC PANEL
BUN: 22 mg/dL (ref 6–23)
Calcium: 8.6 mg/dL (ref 8.4–10.5)
Creatinine, Ser: 1.32 mg/dL — ABNORMAL HIGH (ref 0.50–1.10)
GFR calc Af Amer: 48 mL/min — ABNORMAL LOW (ref >90)
Glucose, Bld: 158 mg/dL — ABNORMAL HIGH (ref 70–99)
Sodium: 136 mEq/L (ref 135–145)
Total Protein: 7.4 g/dL (ref 6.0–8.3)

## 2012-11-25 LAB — CBC
HCT: 29.2 % — ABNORMAL LOW (ref 36.0–46.0)
Hemoglobin: 9.9 g/dL — ABNORMAL LOW (ref 12.0–15.0)
MCH: 27.7 pg (ref 26.0–34.0)
MCHC: 33.9 g/dL (ref 30.0–36.0)
MCV: 81.8 fL (ref 78.0–100.0)

## 2012-11-25 LAB — PROTIME-INR
INR: 1.12 (ref 0.00–1.49)
Prothrombin Time: 14.2 seconds (ref 11.6–15.2)

## 2012-11-25 LAB — SURGICAL PCR SCREEN
MRSA, PCR: NEGATIVE
Staphylococcus aureus: POSITIVE — AB

## 2012-11-25 SURGERY — AMPUTATION BELOW KNEE
Anesthesia: General | Site: Leg Lower | Laterality: Right | Wound class: Clean

## 2012-11-25 MED ORDER — PRAMIPEXOLE DIHYDROCHLORIDE 1 MG PO TABS
1.0000 mg | ORAL_TABLET | Freq: Every day | ORAL | Status: DC
  Administered 2012-11-26 – 2012-11-27 (×2): 1 mg via ORAL
  Filled 2012-11-25 (×4): qty 1

## 2012-11-25 MED ORDER — OXYCODONE HCL 5 MG/5ML PO SOLN: 5.0000 mg | Freq: Once | ORAL | Status: DC | PRN

## 2012-11-25 MED ORDER — METOCLOPRAMIDE HCL 5 MG/ML IJ SOLN: 5.0000 mg | Freq: Three times a day (TID) | INTRAMUSCULAR | Status: DC | PRN

## 2012-11-25 MED ORDER — HYDROCODONE-ACETAMINOPHEN 5-325 MG PO TABS
1.0000 | ORAL_TABLET | ORAL | Status: DC | PRN
  Administered 2012-11-25 – 2012-11-28 (×7): 2 via ORAL
  Filled 2012-11-25 (×7): qty 2

## 2012-11-25 MED ORDER — ONDANSETRON HCL 4 MG/2ML IJ SOLN
INTRAMUSCULAR | Status: AC
  Filled 2012-11-25: qty 2

## 2012-11-25 MED ORDER — PRAMIPEXOLE DIHYDROCHLORIDE 0.25 MG PO TABS
0.5000 mg | ORAL_TABLET | Freq: Two times a day (BID) | ORAL | Status: DC
  Administered 2012-11-26 – 2012-11-28 (×6): 0.5 mg via ORAL
  Filled 2012-11-25 (×7): qty 2

## 2012-11-25 MED ORDER — PROPOFOL 10 MG/ML IV BOLUS
INTRAVENOUS | Status: DC | PRN
  Administered 2012-11-25: 40 mg via INTRAVENOUS
  Administered 2012-11-25: 130 mg via INTRAVENOUS
  Administered 2012-11-25: 80 mg via INTRAVENOUS
  Administered 2012-11-25: 50 mg via INTRAVENOUS

## 2012-11-25 MED ORDER — HYDROMORPHONE HCL PF 1 MG/ML IJ SOLN
0.5000 mg | INTRAMUSCULAR | Status: DC | PRN
  Administered 2012-11-25 (×2): 1 mg via INTRAVENOUS
  Filled 2012-11-25 (×2): qty 1

## 2012-11-25 MED ORDER — GABAPENTIN 300 MG PO CAPS
300.0000 mg | ORAL_CAPSULE | Freq: Three times a day (TID) | ORAL | Status: DC
  Administered 2012-11-25 – 2012-11-28 (×9): 300 mg via ORAL
  Filled 2012-11-25 (×11): qty 1

## 2012-11-25 MED ORDER — METFORMIN HCL 500 MG PO TABS: 1000.0000 mg | ORAL_TABLET | Freq: Two times a day (BID) | ORAL | Status: DC

## 2012-11-25 MED ORDER — ASPIRIN EC 325 MG PO TBEC
325.0000 mg | DELAYED_RELEASE_TABLET | Freq: Every day | ORAL | Status: DC
  Administered 2012-11-26 – 2012-11-28 (×3): 325 mg via ORAL
  Filled 2012-11-25 (×4): qty 1

## 2012-11-25 MED ORDER — MUPIROCIN 2 % EX OINT
TOPICAL_OINTMENT | CUTANEOUS | Status: AC
  Administered 2012-11-25: 1 via NASAL
  Filled 2012-11-25: qty 22

## 2012-11-25 MED ORDER — ONDANSETRON HCL 4 MG/2ML IJ SOLN
INTRAMUSCULAR | Status: DC | PRN
  Administered 2012-11-25: 4 mg via INTRAVENOUS

## 2012-11-25 MED ORDER — METHOCARBAMOL 500 MG PO TABS
500.0000 mg | ORAL_TABLET | Freq: Four times a day (QID) | ORAL | Status: DC | PRN
  Administered 2012-11-25 – 2012-11-28 (×5): 500 mg via ORAL
  Filled 2012-11-25 (×5): qty 1

## 2012-11-25 MED ORDER — INSULIN GLARGINE 100 UNIT/ML ~~LOC~~ SOLN
20.0000 [IU] | Freq: Every day | SUBCUTANEOUS | Status: DC
  Administered 2012-11-25 – 2012-11-27 (×2): 20 [IU] via SUBCUTANEOUS
  Filled 2012-11-25 (×4): qty 0.2

## 2012-11-25 MED ORDER — METOCLOPRAMIDE HCL 10 MG PO TABS
5.0000 mg | ORAL_TABLET | Freq: Three times a day (TID) | ORAL | Status: DC | PRN
Start: 2012-11-25 — End: 2012-11-28

## 2012-11-25 MED ORDER — TRAZODONE HCL 50 MG PO TABS
50.0000 mg | ORAL_TABLET | Freq: Every day | ORAL | Status: DC
  Administered 2012-11-25 – 2012-11-27 (×3): 100 mg via ORAL
  Filled 2012-11-25 (×4): qty 2

## 2012-11-25 MED ORDER — LIDOCAINE HCL (CARDIAC) 20 MG/ML IV SOLN
INTRAVENOUS | Status: DC | PRN
  Administered 2012-11-25: 50 mg via INTRAVENOUS

## 2012-11-25 MED ORDER — ONDANSETRON HCL 4 MG PO TABS
4.0000 mg | ORAL_TABLET | Freq: Four times a day (QID) | ORAL | Status: DC | PRN
  Administered 2012-11-27: 4 mg via ORAL
  Filled 2012-11-25: qty 1

## 2012-11-25 MED ORDER — HYDROMORPHONE HCL PF 1 MG/ML IJ SOLN
INTRAMUSCULAR | Status: AC
  Filled 2012-11-25: qty 1

## 2012-11-25 MED ORDER — MIDAZOLAM HCL 5 MG/5ML IJ SOLN
INTRAMUSCULAR | Status: DC | PRN
  Administered 2012-11-25: 2 mg via INTRAVENOUS

## 2012-11-25 MED ORDER — PRAMIPEXOLE DIHYDROCHLORIDE 0.25 MG PO TABS: 0.5000 mg | ORAL_TABLET | Freq: Three times a day (TID) | ORAL | Status: DC

## 2012-11-25 MED ORDER — INSULIN ASPART 100 UNIT/ML ~~LOC~~ SOLN
0.0000 [IU] | Freq: Three times a day (TID) | SUBCUTANEOUS | Status: DC
  Administered 2012-11-26: 4 [IU] via SUBCUTANEOUS
  Administered 2012-11-26: 7 [IU] via SUBCUTANEOUS
  Administered 2012-11-27: 4 [IU] via SUBCUTANEOUS
  Administered 2012-11-27 – 2012-11-28 (×3): 3 [IU] via SUBCUTANEOUS

## 2012-11-25 MED ORDER — OXYCODONE-ACETAMINOPHEN 5-325 MG PO TABS
ORAL_TABLET | ORAL | Status: AC
  Filled 2012-11-25: qty 2

## 2012-11-25 MED ORDER — LACTATED RINGERS IV SOLN
INTRAVENOUS | Status: DC | PRN
  Administered 2012-11-25: 10:00:00 via INTRAVENOUS

## 2012-11-25 MED ORDER — METOPROLOL TARTRATE 50 MG PO TABS
50.0000 mg | ORAL_TABLET | Freq: Two times a day (BID) | ORAL | Status: DC
  Administered 2012-11-25 – 2012-11-28 (×5): 50 mg via ORAL
  Filled 2012-11-25 (×8): qty 1

## 2012-11-25 MED ORDER — OXYCODONE HCL 5 MG PO TABS: 5.0000 mg | ORAL_TABLET | Freq: Once | ORAL | Status: DC | PRN

## 2012-11-25 MED ORDER — MUPIROCIN 2 % EX OINT
TOPICAL_OINTMENT | Freq: Two times a day (BID) | CUTANEOUS | Status: DC
  Filled 2012-11-25: qty 22

## 2012-11-25 MED ORDER — METHOCARBAMOL 100 MG/ML IJ SOLN
500.0000 mg | Freq: Four times a day (QID) | INTRAVENOUS | Status: DC | PRN
  Filled 2012-11-25: qty 5

## 2012-11-25 MED ORDER — CEFAZOLIN SODIUM-DEXTROSE 2-3 GM-% IV SOLR
2.0000 g | Freq: Four times a day (QID) | INTRAVENOUS | Status: AC
  Administered 2012-11-25 – 2012-11-26 (×3): 2 g via INTRAVENOUS
  Filled 2012-11-25 (×3): qty 50

## 2012-11-25 MED ORDER — HYDROMORPHONE HCL PF 1 MG/ML IJ SOLN
0.2500 mg | INTRAMUSCULAR | Status: DC | PRN
  Administered 2012-11-25 (×6): 0.5 mg via INTRAVENOUS

## 2012-11-25 MED ORDER — METFORMIN HCL 500 MG PO TABS
1000.0000 mg | ORAL_TABLET | Freq: Two times a day (BID) | ORAL | Status: DC
  Administered 2012-11-26 – 2012-11-28 (×5): 1000 mg via ORAL
  Filled 2012-11-25 (×8): qty 2

## 2012-11-25 MED ORDER — ROPINIROLE HCL 1 MG PO TABS
2.0000 mg | ORAL_TABLET | Freq: Every evening | ORAL | Status: DC
Start: 2012-11-25 — End: 2012-11-25

## 2012-11-25 MED ORDER — OXYCODONE-ACETAMINOPHEN 5-325 MG PO TABS
1.0000 | ORAL_TABLET | ORAL | Status: DC | PRN
Start: 2012-11-25 — End: 2012-11-28
  Administered 2012-11-25 – 2012-11-28 (×5): 2 via ORAL
  Filled 2012-11-25 (×4): qty 2

## 2012-11-25 MED ORDER — SODIUM CHLORIDE 0.9 % IV SOLN
INTRAVENOUS | Status: DC
  Administered 2012-11-25: 16:00:00 via INTRAVENOUS

## 2012-11-25 MED ORDER — FENTANYL CITRATE 0.05 MG/ML IJ SOLN
INTRAMUSCULAR | Status: DC | PRN
  Administered 2012-11-25: 75 ug via INTRAVENOUS
  Administered 2012-11-25 (×2): 25 ug via INTRAVENOUS
  Administered 2012-11-25: 50 ug via INTRAVENOUS
  Administered 2012-11-25 (×3): 25 ug via INTRAVENOUS

## 2012-11-25 MED ORDER — INSULIN ASPART 100 UNIT/ML ~~LOC~~ SOLN
6.0000 [IU] | Freq: Three times a day (TID) | SUBCUTANEOUS | Status: DC
  Administered 2012-11-26 – 2012-11-28 (×6): 6 [IU] via SUBCUTANEOUS

## 2012-11-25 MED ORDER — ONDANSETRON HCL 4 MG/2ML IJ SOLN
4.0000 mg | Freq: Four times a day (QID) | INTRAMUSCULAR | Status: DC | PRN
  Administered 2012-11-25: 4 mg via INTRAVENOUS
  Filled 2012-11-25: qty 2

## 2012-11-25 MED ORDER — HYDROMORPHONE HCL PF 1 MG/ML IJ SOLN
INTRAMUSCULAR | Status: AC
  Filled 2012-11-25: qty 2

## 2012-11-25 MED ORDER — ROPINIROLE HCL 1 MG PO TABS
2.0000 mg | ORAL_TABLET | Freq: Every day | ORAL | Status: DC
  Administered 2012-11-25 – 2012-11-27 (×3): 2 mg via ORAL
  Filled 2012-11-25 (×4): qty 2

## 2012-11-25 MED ORDER — METHOCARBAMOL 500 MG PO TABS
ORAL_TABLET | ORAL | Status: AC
  Filled 2012-11-25: qty 1

## 2012-11-25 MED ORDER — ALPRAZOLAM 0.5 MG PO TABS
1.0000 mg | ORAL_TABLET | Freq: Four times a day (QID) | ORAL | Status: DC | PRN
  Administered 2012-11-27: 1 mg via ORAL
  Filled 2012-11-25 (×2): qty 1

## 2012-11-25 MED ORDER — PHENYLEPHRINE HCL 10 MG/ML IJ SOLN
INTRAMUSCULAR | Status: DC | PRN
  Administered 2012-11-25: 120 ug via INTRAVENOUS
  Administered 2012-11-25: 160 ug via INTRAVENOUS
  Administered 2012-11-25: 40 ug via INTRAVENOUS
  Administered 2012-11-25: 80 ug via INTRAVENOUS
  Administered 2012-11-25: 120 ug via INTRAVENOUS

## 2012-11-25 MED ORDER — CILOSTAZOL 100 MG PO TABS
100.0000 mg | ORAL_TABLET | Freq: Every day | ORAL | Status: DC
  Administered 2012-11-26 – 2012-11-28 (×3): 100 mg via ORAL
  Filled 2012-11-25 (×4): qty 1

## 2012-11-25 SURGICAL SUPPLY — 45 items
BANDAGE ESMARK 6X9 LF (GAUZE/BANDAGES/DRESSINGS) ×1 IMPLANT
BANDAGE GAUZE ELAST BULKY 4 IN (GAUZE/BANDAGES/DRESSINGS) ×2 IMPLANT
BLADE SAW RECIP 87.9 MT (BLADE) ×2 IMPLANT
BLADE SURG 21 STRL SS (BLADE) ×2 IMPLANT
BNDG COHESIVE 6X5 TAN STRL LF (GAUZE/BANDAGES/DRESSINGS) ×4 IMPLANT
BNDG ESMARK 6X9 LF (GAUZE/BANDAGES/DRESSINGS) ×2
CLOTH BEACON ORANGE TIMEOUT ST (SAFETY) ×2 IMPLANT
COVER SURGICAL LIGHT HANDLE (MISCELLANEOUS) ×2 IMPLANT
CUFF TOURNIQUET SINGLE 34IN LL (TOURNIQUET CUFF) IMPLANT
CUFF TOURNIQUET SINGLE 44IN (TOURNIQUET CUFF) IMPLANT
DRAIN PENROSE 1/2X12 LTX STRL (WOUND CARE) IMPLANT
DRAPE EXTREMITY T 121X128X90 (DRAPE) ×2 IMPLANT
DRAPE PROXIMA HALF (DRAPES) ×2 IMPLANT
DRAPE U-SHAPE 47X51 STRL (DRAPES) ×2 IMPLANT
DRSG ADAPTIC 3X8 NADH LF (GAUZE/BANDAGES/DRESSINGS) ×2 IMPLANT
DRSG PAD ABDOMINAL 8X10 ST (GAUZE/BANDAGES/DRESSINGS) ×2 IMPLANT
DURAPREP 26ML APPLICATOR (WOUND CARE) ×2 IMPLANT
ELECT REM PT RETURN 9FT ADLT (ELECTROSURGICAL) ×2
ELECTRODE REM PT RTRN 9FT ADLT (ELECTROSURGICAL) ×1 IMPLANT
EVACUATOR 1/8 PVC DRAIN (DRAIN) IMPLANT
GLOVE BIOGEL PI IND STRL 9 (GLOVE) ×1 IMPLANT
GLOVE BIOGEL PI INDICATOR 9 (GLOVE) ×1
GLOVE SURG ORTHO 9.0 STRL STRW (GLOVE) ×2 IMPLANT
GOWN PREVENTION PLUS XLARGE (GOWN DISPOSABLE) IMPLANT
GOWN SRG XL XLNG 56XLVL 4 (GOWN DISPOSABLE) ×2 IMPLANT
GOWN STRL NON-REIN XL XLG LVL4 (GOWN DISPOSABLE) ×2
KIT BASIN OR (CUSTOM PROCEDURE TRAY) ×2 IMPLANT
KIT ROOM TURNOVER OR (KITS) ×2 IMPLANT
MANIFOLD NEPTUNE II (INSTRUMENTS) ×2 IMPLANT
NS IRRIG 1000ML POUR BTL (IV SOLUTION) ×2 IMPLANT
PACK GENERAL/GYN (CUSTOM PROCEDURE TRAY) ×2 IMPLANT
PAD ARMBOARD 7.5X6 YLW CONV (MISCELLANEOUS) ×4 IMPLANT
SLEEVE SURGEON STRL (DRAPES) ×2 IMPLANT
SPONGE GAUZE 4X4 12PLY (GAUZE/BANDAGES/DRESSINGS) ×2 IMPLANT
SPONGE LAP 18X18 X RAY DECT (DISPOSABLE) ×2 IMPLANT
STAPLER VISISTAT 35W (STAPLE) ×2 IMPLANT
STOCKINETTE IMPERVIOUS LG (DRAPES) ×4 IMPLANT
SUT PDS AB 1 CT  36 (SUTURE) ×2
SUT PDS AB 1 CT 36 (SUTURE) ×2 IMPLANT
SUT SILK 2 0 (SUTURE) ×1
SUT SILK 2-0 18XBRD TIE 12 (SUTURE) ×1 IMPLANT
TOWEL OR 17X24 6PK STRL BLUE (TOWEL DISPOSABLE) ×2 IMPLANT
TOWEL OR 17X26 10 PK STRL BLUE (TOWEL DISPOSABLE) ×2 IMPLANT
TUBE ANAEROBIC SPECIMEN COL (MISCELLANEOUS) IMPLANT
WATER STERILE IRR 1000ML POUR (IV SOLUTION) IMPLANT

## 2012-11-25 NOTE — Transfer of Care (Signed)
Immediate Anesthesia Transfer of Care Note  Patient: Mary [name]  Procedure(s) Performed: Procedure(s) with comments: AMPUTATION BELOW KNEE (Right) - Right Below Knee Amputation  Patient Location: PACU  Anesthesia Type:General  Level of Consciousness: awake, alert  and oriented  Airway & Oxygen Therapy: Patient Spontanous Breathing and Patient connected to face mask oxygen  Post-op Assessment: Report given to PACU RN, Post -op Vital signs reviewed and stable and Patient moving all extremities  Post vital signs: Reviewed and stable  Complications: No apparent anesthesia complications

## 2012-11-25 NOTE — Progress Notes (Signed)
Patient informed Nurse that she did not take Metoprolol this morning because she has been NPO and it causes her upset stomach, however she did take it last evening at 2000. Nurse called Dr. Ola Spurr and informed him of this and he stated as long as patient took Metoprolol last night then it was "ok."

## 2012-11-25 NOTE — Anesthesia Preprocedure Evaluation (Addendum)
Anesthesia Evaluation  Patient identified by MRN, date of birth, ID band Patient awake    Reviewed: Allergy & Precautions, H&P , NPO status , Patient's Chart, lab work & pertinent test results, reviewed documented beta blocker date and time   Airway Mallampati: II TM Distance: >3 FB Neck ROM: Full    Dental no notable dental hx. (+) Teeth Intact and Dental Advisory Given   Pulmonary sleep apnea ,  breath sounds clear to auscultation  Pulmonary exam normal       Cardiovascular hypertension, On Medications and On Home Beta Blockers + Peripheral Vascular Disease and +CHF + dysrhythmias Rhythm:Regular Rate:Normal     Neuro/Psych  Neuromuscular disease negative psych ROS   GI/Hepatic negative GI ROS, Neg liver ROS,   Endo/Other  diabetes, Type 1, Insulin DependentMorbid obesity  Renal/GU negative Renal ROS  negative genitourinary   Musculoskeletal   Abdominal   Peds  Hematology negative hematology ROS (+)   Anesthesia Other Findings   Reproductive/Obstetrics negative OB ROS                           Anesthesia Physical Anesthesia Plan  ASA: III  Anesthesia Plan: General   Post-op Pain Management:    Induction: Intravenous  Airway Management Planned: Oral ETT and LMA  Additional Equipment:   Intra-op Plan:   Post-operative Plan: Extubation in OR  Informed Consent: I have reviewed the patients History and Physical, chart, labs and discussed the procedure including the risks, benefits and alternatives for the proposed anesthesia with the patient or authorized representative who has indicated his/her understanding and acceptance.   Dental advisory given  Plan Discussed with: CRNA  Anesthesia Plan Comments:        Anesthesia Quick Evaluation

## 2012-11-25 NOTE — Anesthesia Procedure Notes (Signed)
Procedure Name: LMA Insertion Date/Time: 11/25/2012 10:18 AM Performed by: Julian Reil Pre-anesthesia Checklist: Patient identified, Emergency Drugs available, Suction available and Patient being monitored Patient Re-evaluated:Patient Re-evaluated prior to inductionOxygen Delivery Method: Circle system utilized Preoxygenation: Pre-oxygenation with 100% oxygen Intubation Type: IV induction LMA: LMA inserted LMA Size: 4.0 Number of attempts: 1 Placement Confirmation: positive ETCO2 and breath sounds checked- equal and bilateral Tube secured with: Tape Dental Injury: Teeth and Oropharynx as per pre-operative assessment

## 2012-11-25 NOTE — H&P (Signed)
Mary [name] is an 67 y.o. female.   Chief Complaint: Chronic osteomyelitis with ulceration right calcaneous HPI: Patient is a 67 year old woman with diabetes peripheral vascular disease who has had chronic osteomyelitis of the right calcaneus. She has failed prolonged conservative therapy including hyperbaric oxygen.  Past Medical History  Diagnosis Date  . CHF (congestive heart failure)   . Diabetes mellitus   . Restless leg syndrome 07/25/2011  . Wound, open     right foot  . Diabetes mellitus with neuropathy 07/25/2011  . Cellulitis and abscess of foot 02/12/2012  . Osteomyelitis of right foot 02/14/2012  . Partial Achilles tendon tear 02/14/2012  . Dysrhythmia     tachy- takes Metoprolol  . OSA (obstructive sleep apnea) 07/25/2011    not using CPAP  . Arthritis     knees  . Anemia     Past Surgical History  Procedure Laterality Date  . Abdominal hysterectomy    . Achilles tendon repair    . Toe amputation      partial rt great toe  . Breast surgery Left     Lumpectomy non ca    Family History  Problem Relation Age of Onset  . Diabetes Sister    Social History:  reports that she has never smoked. She has never used smokeless tobacco. She reports that she does not drink alcohol or use illicit drugs.  Allergies: No Known Allergies  No prescriptions prior to admission    No results found for this or any previous visit (from the past 48 hour(s)). No results found.  Review of Systems  All other systems reviewed and are negative.    There were no vitals taken for this visit. Physical Exam  On examination patient has an ulcer over the calcaneus which probes approximately 5 cm deep the entire calcaneus is exposed. Radiographic studies shows chronic osteomyelitis. Assessment/Plan Assessment: Chronic osteomyelitis with ulceration right calcaneus. Plan due to failure conservative care patient presents at this time for transtibial amputation. Risks and benefits were  discussed including infection neurovascular injury nonhealing of the wound potential for higher level amputation. Patient states she understands and wished to proceed at this time.  DUDA,MARCUS V 11/25/2012, 7:01 AM

## 2012-11-25 NOTE — Preoperative (Signed)
Beta Blockers   Reason not to administer Beta Blockers:Not Applicable 

## 2012-11-25 NOTE — Op Note (Signed)
OPERATIVE REPORT  DATE OF SURGERY: 11/25/2012  PATIENT:  Mary [name],  67 y.o. female  PRE-OPERATIVE DIAGNOSIS:  Osteomyelitis Right Foot  POST-OPERATIVE DIAGNOSIS:  Osteomyelitis Right Foot  PROCEDURE:  Procedure(s): AMPUTATION BELOW KNEE  SURGEON:  Surgeon(s): Newt Minion, MD  ANESTHESIA:   general  EBL:  min ML  SPECIMEN:  Source of Specimen:  right leg  TOURNIQUET:  * No tourniquets in log *  PROCEDURE DETAILS: Patient is a 67 year old woman with diabetic insensate neuropathy who has had chronic osteomyelitis of the right calcaneus with ulceration. Patient is undergone prolonged conservative therapy and due to failure of conservative care patient presented this time for transtibial amputation. Risks and benefits were discussed including infection neurovascular injury pain nonhealing of the wound need for higher level amputation. Patient states she understands and wished to proceed at this time. Description of procedure patient brought to operating room and underwent a general anesthetic. After adequate levels and anesthesia were obtained patient's right lower extremity was prepped using DuraPrep draped in a sterile field the foot was draped out of the sterile field with an impervious stockinette. A transverse incision was made 11 cm distal the tibial tubercle was curved proximally and a large posterior flap was created. The tibia and fibula was transected approximately 1 cm proximal to the skin incision. The vascular bundles were clamped with a hemostat and the leg was amputated the amputated knife. Hemostasis was obtained the vascular bundles were suture ligated with 2-0 silk. The sciatic nerve was pulled cut and allowed to retract. The deep and superficial fascial layers were closed using #1 PDS the skin was closed using staples. Wound is covered with Adaptic orthopedic sponges AB dressing Kerlix and Coban. Patient was extubated taken to the PACU in stable condition.  PLAN OF  CARE: Admit to inpatient   PATIENT DISPOSITION:  PACU - hemodynamically stable.   Newt Minion, MD 11/25/2012 11:08 AM

## 2012-11-25 NOTE — Anesthesia Postprocedure Evaluation (Signed)
  Anesthesia Post-op Note  Patient: Mary [name]  Procedure(s) Performed: Procedure(s) with comments: AMPUTATION BELOW KNEE (Right) - Right Below Knee Amputation  Patient Location: PACU  Anesthesia Type:General  Level of Consciousness: awake  Airway and Oxygen Therapy: Patient Spontanous Breathing and Patient connected to nasal cannula oxygen  Post-op Pain: moderate  Post-op Assessment: Post-op Vital signs reviewed, Patient's Cardiovascular Status Stable, Respiratory Function Stable and Patent Airway  Post-op Vital Signs: Reviewed and stable  Complications: No apparent anesthesia complications

## 2012-11-26 NOTE — Evaluation (Addendum)
Occupational Therapy Evaluation Patient Details Name: Mary [name] MRN: [id number] DOB: [birth year] Today's Date: 11/26/2012 Time: [id number] OT Time Calculation (min): 28 min  OT Assessment / Plan / Recommendation History of present illness     Clinical Impression   Pt s/p R BKA thus affecting PLOF. Will benefit from continued acute OT services to address below problem list. Recommending [facility] SNF to progress rehab before eventual return home.    OT Assessment  Patient needs continued OT Services    Follow Up Recommendations  SNF;Supervision/Assistance - 24 hour    Barriers to Discharge      Equipment Recommendations  3 in 1 bedside comode    Recommendations for Other Services    Frequency  Min 2X/week    Precautions / Restrictions Precautions Precautions: Fall   Pertinent Vitals/Pain See vitals    ADL  Grooming: Performed;Wash/dry hands;Set up Where Assessed - Grooming: Unsupported sitting Upper Body Bathing: Simulated;Set up Where Assessed - Upper Body Bathing: Unsupported sitting Lower Body Bathing: Simulated;Moderate assistance Where Assessed - Lower Body Bathing: Supported sit to stand Upper Body Dressing: Simulated;Set up Where Assessed - Upper Body Dressing: Unsupported sitting Lower Body Dressing: Simulated;Maximal assistance Where Assessed - Lower Body Dressing: Supported sit to stand Toilet Transfer: Simulated;+2 Total assistance Toilet Transfer: Patient Percentage: 70% Toilet Transfer Method: Arts development officer:  (bed to chair) Equipment Used: Gait belt;Rolling walker Transfers/Ambulation Related to ADLs: +2 total assist for balance. VCs for sequencing and technique to pivot on left foot. ADL Comments: Educated pt on desensitization techniques for R lower residual limb to assist with pain and also as precursor for eventual prosthesis.     OT Diagnosis: Generalized weakness;Acute pain  OT Problem List: Decreased strength;Decreased  activity tolerance;Impaired balance (sitting and/or standing);Decreased knowledge of use of DME or AE;Pain;Obesity OT Treatment Interventions: Self-care/ADL training;DME and/or AE instruction;Therapeutic activities;Patient/family education;Balance training   OT Goals(Current goals can be found in the care plan section) Acute Rehab OT Goals OT Goal Formulation: With patient Time For Goal Achievement: 12/10/12 Potential to Achieve Goals: Good  Visit Information  Last OT Received On: 11/26/12 Assistance Needed: +2 PT/OT Co-Evaluation/Treatment: Yes       Prior Functioning     Home Living Family/patient expects to be discharged to:: Skilled nursing facility Additional Comments: Pt hoping to go to [place] center. Prior Function Level of Independence: Independent Communication Communication: No difficulties Dominant Hand: Right         Vision/Perception     Cognition  Cognition Arousal/Alertness: Awake/alert Behavior During Therapy: WFL for tasks assessed/performed Overall Cognitive Status: Within Functional Limits for tasks assessed (slightly slow to process information but [facility])    Extremity/Trunk Assessment Upper Extremity Assessment Upper Extremity Assessment: Overall WFL for tasks assessed     Mobility Bed Mobility Bed Mobility: Supine to Sit;Sitting - Scoot to Edge of Bed Supine to Sit: 4: Min guard Sitting - Scoot to [employer] of Bed: 4: Min guard Transfers Transfers: Sit to Stand;Stand to Sit Sit to Stand: 1: +2 Total assist;From bed;With upper extremity assist Sit to Stand: Patient Percentage: 70% Stand to Sit: 4: Min assist;To chair/3-in-1;With armrests;With upper extremity assist Details for Transfer Assistance: VCs for safe hand placement and technique. Assist for steadying.     Exercise     Balance Balance Balance Assessed: Yes Static Sitting Balance Static Sitting - Balance Support: No upper extremity supported;Feet supported (LLE  supported) Static Sitting - Level of Assistance: 5: Stand by assistance Static Standing Balance Static Standing -  Balance Support: Bilateral upper extremity supported Static Standing - Level of Assistance: 4: Min assist Static Standing - Comment/# of Minutes: bil UE supported on RW.  ~3 minutes.   End of Session OT - End of Session Equipment Utilized During Treatment: Gait belt;Rolling walker Activity Tolerance: Patient tolerated treatment well Patient left: in chair;with call bell/phone within reach;with family/visitor present Nurse Communication: Mobility status  GO    11/26/2012 Darrol Jump OTR/L Pager [phone] Office [phone]  Darrol Jump 11/26/2012, 3:47 PM

## 2012-11-26 NOTE — Progress Notes (Addendum)
Clinical Social Work Department CLINICAL SOCIAL WORK PLACEMENT NOTE 11/26/2012  Patient:  Mary [name], Mary [name]  Account Number:  [id number] Admit date:  11/25/2012  Clinical Social Worker:  Sindy Messing, LCSW  Date/time:  11/26/2012 03:00 PM  Clinical Social Work is seeking post-discharge placement for this patient at the following level of care:   [place]   (*CSW will update this form in Epic as items are completed)   11/26/2012  Patient/family provided with [place] Department of Clinical Social Work's list of facilities offering this level of care within the geographic area requested by the patient (or if unable, by the patient's family).  11/26/2012  Patient/family informed of their freedom to choose among providers that offer the needed level of care, that participate in Medicare, Medicaid or managed care program needed by the patient, have an available bed and are willing to accept the patient.  11/26/2012  Patient/family informed of [facility]' ownership interest in [facility], as well as of the fact that they are under no obligation to receive care at this facility.  PASARR submitted to EDS on 11/26/2012 PASARR number received from EDS on 11/26/2012  FL2 transmitted to all facilities in geographic area requested by pt/family on  11/26/2012 FL2 transmitted to all facilities within larger geographic area on   Patient informed that his/her managed care company has contracts with or will negotiate with  certain facilities, including the following:     Patient/family informed of bed offers received:   Patient chooses bed at [facility] center Farrel Conners, [place]) Physician recommends and patient chooses bed at  n/a  Patient to be transferred to  [facility] on  11/28/2012 Farrel Conners, [facility]) Patient to be transferred to facility by [facility]  The following physician request were entered in Epic:   Additional Comments:

## 2012-11-26 NOTE — Progress Notes (Signed)
Clinical Social Work Department BRIEF PSYCHOSOCIAL ASSESSMENT 11/26/2012  Patient:  Mary [name], Mary [name]     Account Number:  [id number]     Admit date:  11/25/2012  Clinical Social Worker:  Earlie Server  Date/Time:  11/26/2012 03:00 PM  Referred by:  Physician  Date Referred:  11/26/2012 Referred for  SNF Placement   Other Referral:   Interview type:  Patient Other interview type:    PSYCHOSOCIAL DATA Living Status:  FAMILY Admitted from facility:   Level of care:   Primary support name:  Henrene Pastor Primary support relationship to patient:  SPOUSE Degree of support available:   Strong    CURRENT CONCERNS Current Concerns  Post-Acute Placement   Other Concerns:    SOCIAL WORK ASSESSMENT / PLAN CSW received referral to assist with DC planning. CSW reviewed chart and met with patient and husband at bedside.    CSW introduced myself and explained role. Patient reports she lives with husband but is understanding of therapy needed at DC. Patient is hopeful to go to [facility]. CSW provided patient with SNF list and explained search could be done to find facility that is in-network with her insurance. CSW encouraged patient to review list and have alternative options. Patient agreeable to [facility] search.    CSW completed FL2 and faxed out. CSW will follow up with bed offers.   Assessment/plan status:  Psychosocial Support/Ongoing Assessment of Needs Other assessment/ plan:   Information/referral to community resources:   SNF information    PATIENT'S/FAMILY'S RESPONSE TO PLAN OF CARE: Patient alert and oriented. Patient and husband agreeable to assessment and engaged throughout session.       WEEKEND COVERAGE

## 2012-11-26 NOTE — Evaluation (Signed)
Physical Therapy Evaluation Patient Details Name: Mary [name] MRN: [id number] DOB: [birth year] Today's Date: 11/26/2012 Time: [id number] PT Time Calculation (min): 27 min  PT Assessment / Plan / Recommendation History of Present Illness  s/p RBKA  Clinical Impression  Patient is s/p R BKA surgery resulting in functional limitations due to the deficits listed below (see PT Problem List).  Patient will benefit from skilled PT to increase their independence and safety with mobility to allow discharge to SNF for continued rehab      PT Assessment  Patient needs continued PT services    Follow Up Recommendations  SNF    Does the patient have the potential to tolerate intense rehabilitation      Barriers to Discharge        Equipment Recommendations  Rolling walker with 5" wheels;3in1 (PT)    Recommendations for Other Services     Frequency Min 3X/week    Precautions / Restrictions Precautions Precautions: Fall   Pertinent Vitals/Pain 5/10 R residual limb patient repositioned for comfort ice applied with education for skin protection       Mobility  Bed Mobility Bed Mobility: Supine to Sit;Sitting - Scoot to Edge of Bed Supine to Sit: 4: Min guard Sitting - Scoot to [employer] of Bed: 4: Min guard Details for Bed Mobility Assistance: cues for technique Transfers Transfers: Sit to Stand;Stand to [employer] Transfers Sit to Stand: 1: +2 Total assist;From bed;With upper extremity assist Sit to Stand: Patient Percentage: 70% Stand to Sit: 4: Min assist;To chair/3-in-1;With armrests;With upper extremity assist Stand Pivot Transfers: 1: +2 Total assist Stand Pivot Transfers: Patient Percentage: 70% Details for Transfer Assistance: VCs for safe hand placement and technique. Assist for steadying.    Exercises Amputee Exercises Quad Sets: AROM;Right;10 reps Gluteal Sets: Both;AROM;10 reps;Seated;Standing   PT Diagnosis: Difficulty walking;Acute pain  PT Problem List:  Decreased strength;Decreased range of motion;Decreased activity tolerance;Decreased balance;Decreased mobility;Decreased knowledge of use of DME;Decreased knowledge of precautions;Pain PT Treatment Interventions: DME instruction;Gait training;Functional mobility training;Therapeutic activities;Therapeutic exercise;Balance training;Patient/family education     PT Goals(Current goals can be found in the care plan section) Acute Rehab PT Goals Patient Stated Goal: wants to get prosthesis PT Goal Formulation: With patient Time For Goal Achievement: 12/10/12 Potential to Achieve Goals: Good  Visit Information  Last PT Received On: 11/26/12 Assistance Needed: +2 PT/OT Co-Evaluation/Treatment: Yes History of Present Illness: s/p RBKA       Prior [place] expects to be discharged to:: Skilled nursing facility Additional Comments: Pt hoping to go to [place] center. Prior Function Level of Independence: Independent Communication Communication: No difficulties Dominant Hand: Right    Cognition  Cognition Arousal/Alertness: Awake/alert Behavior During Therapy: WFL for tasks assessed/performed Overall Cognitive Status: Within Functional Limits for tasks assessed (slightly slow to process information but [facility])    Extremity/Trunk Assessment Upper Extremity Assessment Upper Extremity Assessment: Defer to OT evaluation Lower Extremity Assessment Lower Extremity Assessment: RLE deficits/detail;LLE deficits/detail RLE Deficits / Details: s/p BKA; Very good knee extension and flexion range; Noted tight hamstrings RLE Sensation:  (educated in tactile stimulation of residual limb) LLE Deficits / Details: pt reports this is her "weaker leg"   Balance Balance Balance Assessed: Yes Static Sitting Balance Static Sitting - Balance Support: No upper extremity supported;Feet supported (LLE supported) Static Sitting - Level of Assistance: 5: Stand by  assistance Static Standing Balance Static Standing - Balance Support: Bilateral upper extremity supported Static Standing - Level of Assistance: 4: Min assist Static  Standing - Comment/# of Minutes: bil UE support on RW  End of Session PT - End of Session Equipment Utilized During Treatment: Gait belt Activity Tolerance: Patient tolerated treatment well Patient left: in chair;with call bell/phone within reach;with family/visitor present Nurse Communication: Mobility status  GP     Quin Hoop [place], [place]  11/26/2012, 5:46 PM

## 2012-11-26 NOTE — Progress Notes (Signed)
Subjective: 1 Day Post-Op Procedure(s) (LRB): AMPUTATION BELOW KNEE (Right) Patient reports pain as mild.  Very difficult time with mobility.  Objective: Vital signs in last 24 hours: Temp:  [97.8 F (36.6 C)-98.5 F (36.9 C)] 98.5 F (36.9 C) (06/29 0700) Pulse Rate:  [78-98] 80 (06/29 0859) Resp:  [12-18] 16 (06/29 0700) BP: (115-158)/(52-86) 115/52 mmHg (06/29 0859) SpO2:  [96 %-100 %] 96 % (06/29 0700) Weight:  [116.393 kg (256 lb 9.6 oz)] 116.393 kg (256 lb 9.6 oz) (06/28 1259)  Intake/Output from previous day: 06/28 0701 - 06/29 0700 In: 720 [P.O.:120; I.V.:600] Out: 100 [Blood:100] Intake/Output this shift: Total I/O In: 240 [P.O.:240] Out: 600 [Urine:600]   Recent Labs  11/25/12 0858  HGB 9.9*    Recent Labs  11/25/12 0858  WBC 8.0  RBC 3.57*  HCT 29.2*  PLT 305    Recent Labs  11/25/12 0858  NA 136  K 4.6  CL 99  CO2 26  BUN 22  CREATININE 1.32*  GLUCOSE 158*  CALCIUM 8.6    Recent Labs  11/25/12 0858  INR 1.12    Incision: dressing C/D/I  Assessment/Plan: 1 Day Post-Op Procedure(s) (LRB): AMPUTATION BELOW KNEE (Right) Discharge to SNF in the next few days.  Mcarthur Rossetti 11/26/2012, 9:37 AM

## 2012-11-27 ENCOUNTER — Encounter (HOSPITAL_COMMUNITY): Payer: Self-pay | Admitting: General Practice

## 2012-11-27 LAB — GLUCOSE, CAPILLARY
Glucose-Capillary: 155 mg/dL — ABNORMAL HIGH (ref 70–99)
Glucose-Capillary: 65 mg/dL — ABNORMAL LOW (ref 70–99)
Glucose-Capillary: 73 mg/dL (ref 70–99)
Glucose-Capillary: 152 mg/dL — ABNORMAL HIGH (ref 70–99)
Glucose-Capillary: 79 mg/dL (ref 70–99)

## 2012-11-27 MED ORDER — GABAPENTIN 300 MG PO CAPS: 300.0000 mg | ORAL_CAPSULE | Freq: Three times a day (TID) | ORAL | Status: DC

## 2012-11-27 MED ORDER — OXYCODONE-ACETAMINOPHEN 5-325 MG PO TABS: 1.0000 | ORAL_TABLET | ORAL | Status: DC | PRN

## 2012-11-27 NOTE — Progress Notes (Signed)
Patient ID: Mary [name], female   DOB: 11-Apr-1946, 67 y.o.   MRN: [id number] Discharge summary dictated. Patient for discharge to skilled nursing.

## 2012-11-27 NOTE — Progress Notes (Signed)
UR COMPLETED  

## 2012-11-27 NOTE — Progress Notes (Signed)
Physical Therapy Treatment Patient Details Name: Mary [name] MRN: [id number] DOB: 1945-08-24 Today's Date: 11/27/2012 Time: [id number] PT Time Calculation (min): 12 min  PT Assessment / Plan / Recommendation  PT Comments   Performed therex very well, including prone hip extension; continued to reinforce that pt needs to keep knee and hip extension range while residual limb heals in order to be ready for prosthesis training;  Pt should do well at postacute rehab  Follow Up Recommendations  SNF     Does the patient have the potential to tolerate intense rehabilitation     Barriers to Discharge        Equipment Recommendations  Rolling walker with 5" wheels;3in1 (PT)    Recommendations for Other Services    Frequency Min 3X/week   Progress towards PT Goals Progress towards PT goals: Progressing toward goals  Plan Current plan remains appropriate    Precautions / Restrictions Precautions Precautions: Fall   Pertinent Vitals/Pain no apparent distress     Mobility  Bed Mobility Bed Mobility: Rolling Right;Rolling Left Rolling Right: 5: Supervision Rolling Left: 5: Supervision Details for Bed Mobility Assistance: Pt performed rolling supine to prone for prone therex; Inefficient movement, but overall managing well Transfers Transfers:  (Pt politely declined OOB)    Exercises Amputee Exercises Quad Sets: AROM;Right;10 reps Gluteal Sets: Both;AROM;10 reps Hip Extension: AROM;Right;Strengthening;10 reps;Prone Knee Flexion: AROM;Right;10 reps;Supine   PT Diagnosis:    PT Problem List:   PT Treatment Interventions:     PT Goals (current goals can now be found in the care plan section) Acute Rehab PT Goals Patient Stated Goal: wants to get prosthesis Time For Goal Achievement: 12/10/12 Potential to Achieve Goals: Good  Visit Information  Last PT Received On: 11/27/12 History of Present Illness: s/p RBKA    Subjective Data  Subjective: Had been up all day Patient  Stated Goal: wants to get prosthesis   Cognition  Cognition Arousal/Alertness: Awake/alert Behavior During Therapy: WFL for tasks assessed/performed Overall Cognitive Status: Within Functional Limits for tasks assessed    Balance     End of Session PT - End of Session Activity Tolerance: Patient tolerated treatment well Patient left: in bed;with call bell/phone within reach   Brock Hall, [place], [place]  11/27/2012, 5:02 PM

## 2012-11-27 NOTE — Discharge Summary (Signed)
Physician Discharge Summary  Patient ID: Devanne Benoy MRN: [id number] DOB/AGE: 10-31-1945 67 y.o.  Admit date: 11/25/2012 Discharge date: 11/27/2012  Admission Diagnoses: Osteomyelitis right calcaneous  Discharge Diagnoses: Same Active Problems:   * No active hospital problems. *   Discharged Condition: stable  Hospital Course: Patient's hospital course was essentially unremarkable. She underwent a right transtibial amputation. Postoperatively patient progressed slowly with therapy and was felt to require short-term nursing facility for discharge.  Consults: None  Significant Diagnostic Studies: labs: Routine labs  Treatments: surgery: See operative note  Discharge Exam: Blood pressure 129/70, pulse 78, temperature 98 F (36.7 C), temperature source Oral, resp. rate 18, height 5\' 10"  (1.778 m), weight 116.393 kg (256 lb 9.6 oz), last menstrual period 07/25/2011, SpO2 98.00%. Incision/Wound: dressing clean dry and intact  Disposition: 06-Home-Health Care Svc   Future Appointments Provider Department Dept Phone   12/08/2012 9:45 AM Campbell Riches, MD [place] [facility] [phone]       Medication List    ASK your doctor about these medications       ALPRAZolam 1 MG tablet  Commonly known as:  XANAX  Take 1 mg by mouth every 6 (six) hours as needed for anxiety (restless leg).     cholecalciferol 1000 UNITS tablet  Commonly known as:  VITAMIN D  Take 1,000 Units by mouth 2 (two) times daily.     cilostazol 100 MG tablet  Commonly known as:  PLETAL  Take 100 mg by mouth daily.     cyclobenzaprine 5 MG tablet  Commonly known as:  FLEXERIL  Take 5 mg by mouth 2 (two) times daily.     FERROUS FUMARATE PO  Take 5 tablets by mouth daily.     fluconazole 100 MG tablet  Commonly known as:  DIFLUCAN  Take 50 mg by mouth See admin instructions. Every 5 days     insulin lispro protamine-lispro (50-50) 100 UNIT/ML Susp  Commonly  known as:  HUMALOG 50/50  Inject 20-40 Units into the skin 3 (three) times daily with meals. 40 units in AM 20 units at [facility] and 30 units in PM     levofloxacin 500 MG tablet  Commonly known as:  LEVAQUIN  Take 500 mg by mouth daily.     metFORMIN 1000 MG tablet  Commonly known as:  GLUCOPHAGE  Take 1,000 mg by mouth 2 (two) times daily with a meal.     metoprolol 50 MG tablet  Commonly known as:  LOPRESSOR  Take 1 tablet (50 mg total) by mouth 2 (two) times daily.     multivitamin with minerals Tabs  Take 1 tablet by mouth daily.     pramipexole 0.5 MG tablet  Commonly known as:  MIRAPEX  Take 0.5-1 mg by mouth 3 (three) times daily with meals. 1 tablet with breakfast and lunch; 2 tablets with supper     rOPINIRole 2 MG tablet  Commonly known as:  REQUIP  Take 2 mg by mouth every evening.     traMADol 50 MG tablet  Commonly known as:  ULTRAM  Take 50 mg by mouth 4 (four) times daily as needed for pain.     traZODone 50 MG tablet  Commonly known as:  DESYREL  Take 50-100 mg by mouth at bedtime. 1-2 tablets depending on severity of RLS           Follow-up Information   Follow up with Eland Lamantia V, MD In 2 weeks.  Contact information:   [place] [address] [phone]       Signed: Newt Minion 11/27/2012, 6:41 AM

## 2012-11-28 ENCOUNTER — Inpatient Hospital Stay
Admission: RE | Admit: 2012-11-28 | Discharge: 2013-01-05 | Disposition: A | Discharge status: Home / Self Care | Payer: Medicare Other | Source: Ambulatory Visit | Attending: Internal Medicine | Admitting: Internal Medicine

## 2012-11-28 ENCOUNTER — Encounter (HOSPITAL_COMMUNITY): Payer: Self-pay | Admitting: Orthopedic Surgery

## 2012-11-28 LAB — GLUCOSE, CAPILLARY: Glucose-Capillary: 133 mg/dL — ABNORMAL HIGH (ref 70–99)

## 2012-11-28 NOTE — Discharge Summary (Signed)
  No change in patient's status. Discharge delayed do to insurance company. Discharged to home today with no changes.

## 2012-11-28 NOTE — Clinical Social Work Note (Signed)
CSW was notified by [facility], SNF that they were ready to receive the pt today.  Pt has d/c summary available.  D/c summary was sent electronically to [facility], SNF.  CSW contacted pt spouse, Henrene Pastor to inform him of disposition plans today.  Spouse in agreement and will share information with pt once she wakes.  Nonnie Done, [place] [phone]  Clinical Social Work

## 2012-11-28 NOTE — Progress Notes (Signed)
Occupational Therapy Treatment Patient Details Name: Mary [name] MRN: [id number] DOB: [birth year] Today's Date: 11/28/2012 Time: [id number] OT Time Calculation (min): 19 min  OT Assessment / Plan / Recommendation  OT comments  Pt fearful during sit - stands in prep for transfer to [facility]. Pt very motivated, pleasant and cooperative and is planning to d/c to SNF for further rehab after   Follow Up Recommendations  SNF;Supervision/Assistance - 24 hour    Barriers to Discharge   none    Equipment Recommendations  3 in 1 bedside comode    Recommendations for Other Services    Frequency Min 2X/week   Progress towards OT Goals Progress towards OT goals: Progressing toward goals  Plan Discharge plan remains appropriate    Precautions / Restrictions Precautions Precautions: Fall Restrictions Weight Bearing Restrictions: No   Pertinent Vitals/Pain     ADL  Toilet Transfer: +1 Total assistance;Performed Toilet Transfer Method: Sit to stand;Stand pivot Toilet Transfer Equipment: Bedside commode ADL Comments: pt fearful during transfer and declined to attempt again. Pt agreeable to sit - stands, and static standing balance from chair to RW    OT Diagnosis:    OT Problem List:   OT Treatment Interventions:     OT Goals(current goals can now be found in the care plan section)    Visit Information  Last OT Received On: 11/28/12 History of Present Illness: s/p RBKA    Subjective Data      Prior Functioning       Cognition  Cognition Arousal/Alertness: Awake/alert Behavior During Therapy: WFL for tasks assessed/performed Overall Cognitive Status: Within Functional Limits for tasks assessed    Mobility  Bed Mobility Bed Mobility: Not assessed Transfers Transfers: Sit to Stand;Stand to Sit Sit to Stand: 1: +1 Total assist;With upper extremity assist;With armrests;From chair/3-in-1 Stand to Sit: 4: Min assist;To chair/3-in-1;With armrests;With upper extremity  assist Details for Transfer Assistance: VCs for safe hand placement and technique. Assist for steadying.    Exercises      Balance Static Standing Balance Static Standing - Balance Support: Bilateral upper extremity supported Static Standing - Level of Assistance: 4: Min assist   End of Session OT - End of Session Equipment Utilized During Treatment: Gait belt;Rolling walker;Other (comment) (BSC) Activity Tolerance: Patient tolerated treatment well Patient left: in chair;with call bell/phone within reach  GO     Britt Bottom 11/28/2012, 2:33 PM

## 2012-11-28 NOTE — Progress Notes (Signed)
Physical Therapy Treatment Patient Details Name: Mary [name] MRN: [id number] DOB: 1946-02-24 Today's Date: 11/28/2012 Time: [id number] PT Time Calculation (min): 25 min  PT Assessment / Plan / Recommendation  PT Comments   Very motivated; fearful of standing and balance on one LE, should improve with practice; Will do well at postacute rehab  Follow Up Recommendations  SNF     Does the patient have the potential to tolerate intense rehabilitation     Barriers to Discharge        Equipment Recommendations  Rolling walker with 5" wheels;3in1 (PT)    Recommendations for Other Services    Frequency Min 3X/week   Progress towards PT Goals Progress towards PT goals: Progressing toward goals  Plan Current plan remains appropriate    Precautions / Restrictions Precautions Precautions: Fall Restrictions Weight Bearing Restrictions: No   Pertinent Vitals/Pain no apparent distress     Mobility  Bed Mobility Bed Mobility: Not assessed Transfers Transfers: Sit to Stand;Stand to Sit Sit to Stand: 3: Mod assist Stand to Sit: 4: Min assist;To chair/3-in-1;With armrests;With upper extremity assist Details for Transfer Assistance: VCs for safe hand placement and technique. Assist for steadying. Ambulation/Gait Ambulation/Gait Assistance: Other (comment) (Pt very anxious re: taking steps)    Exercises Amputee Exercises Quad Sets: AROM;Right;10 reps Hip Extension: AROM;Right;Standing Hip ABduction/ADduction: AROM;Right;10 reps;Standing Knee Flexion: AROM;Right;10 reps Knee Extension: AROM;Right;10 reps;Seated Chair Push Up: AROM;5 reps   PT Diagnosis:    PT Problem List:   PT Treatment Interventions:     PT Goals (current goals can now be found in the care plan section) Acute Rehab PT Goals Patient Stated Goal: wants to get prosthesis Time For Goal Achievement: 12/10/12 Potential to Achieve Goals: Good  Visit Information  Last PT Received On: 11/28/12 Assistance  Needed: +1 History of Present Illness: s/p RBKA    Subjective Data  Subjective: Wanting to get to rehab facility Patient Stated Goal: wants to get prosthesis   Cognition  Cognition Arousal/Alertness: Awake/alert Behavior During Therapy: WFL for tasks assessed/performed Overall Cognitive Status: Within Functional Limits for tasks assessed    Balance  Static Standing Balance Static Standing - Balance Support: Bilateral upper extremity supported Static Standing - Level of Assistance: 4: Min assist Static Standing - Comment/# of Minutes: minguard assist in front of chair approx 5 minutes total for standing therex  End of Session PT - End of Session Activity Tolerance: Patient limited by fatigue Patient left: in chair;with call bell/phone within reach   GP     Roney Marion [facility] [place], [place]  11/28/2012, 3:20 PM

## 2012-11-29 ENCOUNTER — Non-Acute Institutional Stay (SKILLED_NURSING_FACILITY): Payer: Medicare Other | Admitting: Internal Medicine

## 2012-11-29 ENCOUNTER — Other Ambulatory Visit: Payer: Self-pay | Admitting: *Deleted

## 2012-11-29 DIAGNOSIS — G2581 Restless legs syndrome: Secondary | ICD-10-CM

## 2012-11-29 DIAGNOSIS — M9080 Osteopathy in diseases classified elsewhere, unspecified site: Secondary | ICD-10-CM

## 2012-11-29 DIAGNOSIS — E1169 Type 2 diabetes mellitus with other specified complication: Secondary | ICD-10-CM

## 2012-11-29 DIAGNOSIS — D62 Acute posthemorrhagic anemia: Secondary | ICD-10-CM

## 2012-11-29 DIAGNOSIS — M869 Osteomyelitis, unspecified: Secondary | ICD-10-CM

## 2012-11-29 LAB — GLUCOSE, CAPILLARY
Glucose-Capillary: 88 mg/dL (ref 70–99)
Glucose-Capillary: 36 mg/dL — CL (ref 70–99)
Glucose-Capillary: 90 mg/dL (ref 70–99)

## 2012-11-29 MED ORDER — OXYCODONE-ACETAMINOPHEN 5-325 MG PO TABS: 1.0000 | ORAL_TABLET | ORAL | Status: DC | PRN

## 2012-11-29 NOTE — Progress Notes (Signed)
Patient ID: Mary [name], female   DOB: [birth year], 67 y.o.   MRN: [id number] Chief complaint; admission to SNF. [place]  history; this patient apparently has had a wound on her right heel over the last year. She was in the hospital. At any [facility] in May and apparently with sepsis. She followed with infectious disease. She was followed at the wound care center in [place] and underwent hyperbaric oxygen for a period of time. Nevertheless in spite of all of this the wound deteriorated, she developed underlying osteomyelitis and required a right below knee amputation.. this was performed by Dr.Duda. The patient is a type II diabetic on insulin for the last 8 years. She tells me her hemoglobin A1c was 8 when last checked. She is diabetic neuropathy, but apparently her vascular supply is adequate. She already has had diabetic hypoglycemia since arriving in the facility. Today, at 11:30 CBG dropped to 47. She required orange juice, glucagon, and Glucerna.  Past medical history #1 type 2 diabetes with neuropathy. #2, now status post right below knee amputation for osteomyelitis of the right calcaneus discharged on Levaquin. #3 apparently severe restless leg syndrome. #4 question peripheral vascular disease on Pletal. #5 anemia on ferrous fumarate 5 tablets daily. #6 hypertension  Medication Xanax 1 mg by mouth every 6 hours when necessary, tramadol 50 mg by mouth 4 times a day, when necessary pain, trazodone, 50 mg by mouth each bedtime when necessary insomnia, Humalog 50-50 40 units in the morning, 20 units at lunch, and 30 units in the evening, vitamin D thousand units twice a day, Pletal 100 mg daily, cyclobenzaprine 5 mg by mouth twice a day, ferrous sulfate 5 minute, 5 tablets by mouth daily, Levaquin 500 by mouth daily, metformin thousand by mouth twice a day, metoprolol 50 by mouth twice a day, Requip 2 mg by mouth every afternoon, Mirapex, 0.5 by mouth with breakfast and lunch, and 2 by mouth  with supper.  Socially ; patient lives in [place] with her husband. Previously independent nonsmoker.  Review of system;  Respiratory no shortness of breath or cough. Cardiac no chest pain. Abdomen no abdominal pain, or diarrhea. Extremities she describes severe restless leg syndrome during the day and at night. Incisional pain is mild at this point.  Physical exam patient is in no distress. Respiratory clear entry bilaterally. Cardiac heart sounds are normal. No murmurs or bruits. Abdomen morbidly obese without mass or tenderness. Extremities right below knee amputation site is approximated. Scant amount of sanguinous discharge from one small area. The distal part of the amputation site has some erythema, but no significant tenderness or warmth. Vascular popliteal pulses are intact bilaterally.  Impression/plan #1 status post right below-knee amputation for osteomyelitis. I believe she is on Levaquin possibly for concern of a stump infection. I will continue this until I am able to see this. Next week. #2 type 2 diabetes with diabetic neuropathy, on insulin. She has already had a significant hypoglycemic spell. She will require adjustments of her insulin. She is not on aspirin. Would wonder about her lipid status. #3 severe restless leg syndrome, which seems in addition to the diabetic neuropathy. She has Anemia. We'll check her hemoglobin    Appropriate orders were left. I will reduce her insulin across-the-board stop the metformin. Monitoring of her stump next week.

## 2012-11-30 ENCOUNTER — Encounter: Payer: Self-pay | Admitting: Internal Medicine

## 2012-11-30 ENCOUNTER — Other Ambulatory Visit: Payer: Self-pay | Admitting: Geriatric Medicine

## 2012-11-30 DIAGNOSIS — E119 Type 2 diabetes mellitus without complications: Secondary | ICD-10-CM

## 2012-11-30 DIAGNOSIS — Z89511 Acquired absence of right leg below knee: Secondary | ICD-10-CM

## 2012-11-30 DIAGNOSIS — Z89512 Acquired absence of left leg below knee: Secondary | ICD-10-CM

## 2012-11-30 LAB — GLUCOSE, CAPILLARY
Glucose-Capillary: 100 mg/dL — ABNORMAL HIGH (ref 70–99)
Glucose-Capillary: 167 mg/dL — ABNORMAL HIGH (ref 70–99)
Glucose-Capillary: 214 mg/dL — ABNORMAL HIGH (ref 70–99)

## 2012-11-30 MED ORDER — OXYCODONE-ACETAMINOPHEN 5-325 MG PO TABS: 1.0000 | ORAL_TABLET | ORAL | Status: DC | PRN

## 2012-11-30 NOTE — Progress Notes (Signed)
Patient ID: Mary [name], female   DOB: Nov 30, 1945, 67 y.o.   MRN: [id number]  This is an acute visit.  Facility [facility].  Local care skilled.  Chief complaint-visit followup right BKA with erythema.--Diabetes type 2 followup.  History of present illness.  Patient is a very pleasant female here for rehabilitation after undergoing a right below-the-knee amputation secondary to a nonhealing right heel wound complicated with osteomyelitis.  He she has had some erythema at the surgical site-she is on Levaquin and this was started at the hospital.  According to nursing staff there's a slight increase in the erythema today compared to yesterday.  There is no drainage from the wound or really increased acute tenderness.  Her vital signs continued to be stable she is afebrile.  She also is a type II diabetic apparently quite fragile actually her blood sugar at noon yesterday dipped into the 30s or 40s- did respond to numerous agents including glucagon and orange juice.  Dr. Dellia Nims did decrease her insulin her Humalog 50-50 which she receives 3 times a day blood sugar this morning was 247 -- 100 at noon-staff is encouraging consistent dietary intake Her Glucophage also has been discontinued   .  Family medical social history reviewed per history and physical on 11/29/2012.  Medications have been reviewed per MAR.  Review of systems.  In general denies any fever chills.  Respiratory no complaints of shortness of breath or cough.  Cardiac does not complaining of chest pain.  Muscle skeletal-does not really complain of acute leg or stump pain here.  Physical exam.  Temperature 97.4 pulse 82 respirations 20 blood pressure 126/70.  Gen. this is a obese middle-aged female in no distress lying comfortably in bed.  Her skin is warm and dry.  Chest is clear to auscultation without rhonchi rales or wheezes.  Heart is regular rate and rhythm without murmur gallop or  rub.  Extremities-right below the knee amputation site continues to be approximated-I do not really see any discharge there is some dry crusting along the surgical site.  There is some erythema of the distal inferior aspect area-slight warmth-according to nursing staff the erythema has progressed a small amount since yesterday--it has not progressed superior to surgical site.  Labs.  11/25/2012.  WBC 8.0 hemoglobin 9.9 platelets 305.  Sodium 136 potassium 4.6 BUN 22 creatinine 1.32.  Liver function tests within normal limits except albumin of 3.1.  Assessment and plan.  #1-status post right BKA-per nursing slight increase in erythema inferior aspect--appears to be baseline superior aspect-discussed with Dr. Dellia Nims via phone he saw it  yesterday-at this point will continue to monitor--this does not appear to be rapidly progressing-she does continue on Levaquin.  #2-diabetes type 2-I do not see any hypoglycemic readings since insulin was reduced and Glucophage DC-continue to monitor-nursing staff has encouraged frequent snacks  [id number]   Patient: Mary [name]  Procedure(s) Performed:   Anesthesia type:   Patient location:   Post pain:   Post assessment:   Last Vitals:   Post vital signs:   Level of consciousness:   Complications:   .  Marland Kitchen This encounter was created in error - please disregard. This encounter was created in error - please disregard. This encounter was created in error - please disregard.

## 2012-12-01 LAB — GLUCOSE, CAPILLARY
Glucose-Capillary: 212 mg/dL — ABNORMAL HIGH (ref 70–99)
Glucose-Capillary: 104 mg/dL — ABNORMAL HIGH (ref 70–99)

## 2012-12-02 LAB — GLUCOSE, CAPILLARY: Glucose-Capillary: 128 mg/dL — ABNORMAL HIGH (ref 70–99)

## 2012-12-03 LAB — GLUCOSE, CAPILLARY
Glucose-Capillary: 194 mg/dL — ABNORMAL HIGH (ref 70–99)
Glucose-Capillary: 169 mg/dL — ABNORMAL HIGH (ref 70–99)
Glucose-Capillary: 182 mg/dL — ABNORMAL HIGH (ref 70–99)
Glucose-Capillary: 196 mg/dL — ABNORMAL HIGH (ref 70–99)

## 2012-12-04 ENCOUNTER — Non-Acute Institutional Stay (SKILLED_NURSING_FACILITY): Payer: Medicare Other | Admitting: Internal Medicine

## 2012-12-04 DIAGNOSIS — L03119 Cellulitis of unspecified part of limb: Secondary | ICD-10-CM

## 2012-12-04 DIAGNOSIS — L02419 Cutaneous abscess of limb, unspecified: Secondary | ICD-10-CM

## 2012-12-04 LAB — GLUCOSE, CAPILLARY: Glucose-Capillary: 160 mg/dL — ABNORMAL HIGH (ref 70–99)

## 2012-12-05 LAB — GLUCOSE, CAPILLARY
Glucose-Capillary: 202 mg/dL — ABNORMAL HIGH (ref 70–99)
Glucose-Capillary: 143 mg/dL — ABNORMAL HIGH (ref 70–99)
Glucose-Capillary: 239 mg/dL — ABNORMAL HIGH (ref 70–99)

## 2012-12-06 LAB — GLUCOSE, CAPILLARY
Glucose-Capillary: 147 mg/dL — ABNORMAL HIGH (ref 70–99)
Glucose-Capillary: 164 mg/dL — ABNORMAL HIGH (ref 70–99)
Glucose-Capillary: 159 mg/dL — ABNORMAL HIGH (ref 70–99)
Glucose-Capillary: 117 mg/dL — ABNORMAL HIGH (ref 70–99)

## 2012-12-07 LAB — GLUCOSE, CAPILLARY
Glucose-Capillary: 232 mg/dL — ABNORMAL HIGH (ref 70–99)
Glucose-Capillary: 143 mg/dL — ABNORMAL HIGH (ref 70–99)

## 2012-12-08 ENCOUNTER — Encounter: Payer: Self-pay | Admitting: Infectious Diseases

## 2012-12-08 ENCOUNTER — Ambulatory Visit (INDEPENDENT_AMBULATORY_CARE_PROVIDER_SITE_OTHER): Payer: Medicare Other | Admitting: Infectious Diseases

## 2012-12-08 VITALS — BP 134/75 | HR 84 | Temp 97.7°F

## 2012-12-08 DIAGNOSIS — E119 Type 2 diabetes mellitus without complications: Secondary | ICD-10-CM

## 2012-12-08 DIAGNOSIS — Z89511 Acquired absence of right leg below knee: Secondary | ICD-10-CM

## 2012-12-08 DIAGNOSIS — S88119A Complete traumatic amputation at level between knee and ankle, unspecified lower leg, initial encounter: Secondary | ICD-10-CM

## 2012-12-08 DIAGNOSIS — M869 Osteomyelitis, unspecified: Secondary | ICD-10-CM

## 2012-12-08 DIAGNOSIS — B3789 Other sites of candidiasis: Secondary | ICD-10-CM

## 2012-12-08 LAB — GLUCOSE, CAPILLARY: Glucose-Capillary: 194 mg/dL — ABNORMAL HIGH (ref 70–99)

## 2012-12-08 MED ORDER — FLUCONAZOLE 100 MG PO TABS: 100.0000 mg | ORAL_TABLET | Freq: Every day | ORAL | Status: DC

## 2012-12-08 NOTE — Assessment & Plan Note (Signed)
Being controlled at rehab, will need good control at d/c, home as well.

## 2012-12-08 NOTE — Progress Notes (Signed)
  Subjective:    Patient ID: Mary [name], female    DOB: [birth year], 67 y.o.   MRN: [id number]  HPI 67 yo F with hx of DM2 (poorly controlled, > 10 yr), HTN, OSA, obesity and chronic R heel wound with osteomyelitis dx Sept 2013 (she states she has had this wound > 1 yr). She had streptococcal bacteremia at that time.  She was admitted to [facility] 10-07-12 with fever and weakness. She was started on vanco/zosyn and was eval by WOC (she previously has Therapist, sports). She was found to have MSSA (R-PEN) bacteremia, had a normal TTE (mod LVH EF 50%) and was d/c home on Ancef. Repeat BCx 5-13 (-). She received 4 weeks of anbx (completed 11-07-12).  She had Bx showing osteomyelitis and periostitis on 11-02-12. This was sent for Cx which grew E cloacae (R-cefazolin). She is scheduled to be started on hyperaric O2. She was seen in ID clinic in f/u 11-10-12 and had her PIC removed and was started on po levaquin. She had R BKA on 11-25-12 as her wound was not healing. She was eval for a skin graft but was found to have significant depth to her wound. She was also having increasing pain in her foot.  FSG have been "good", she is in rehab and it has been watched closely there. She has some phantom pain. Had some erythema around her wound post-op. This has improved.  F/u with Dr Sharol Given on 12-13-12.    Review of Systems  Constitutional: Negative for chills and unexpected weight change.  wound is healing well.  Has fungal dermatitis under her breast. No vision problems.     Objective:   Physical Exam  Constitutional: She appears well-developed and well-nourished.  Musculoskeletal:       Legs:         Assessment & Plan:

## 2012-12-08 NOTE — Assessment & Plan Note (Signed)
She is doing well. Her wound is healing. I asked that she call me if she has worsening erythema, wound breakdown, pain. She can stop her anbx now. Will o/w see her back prn.

## 2012-12-08 NOTE — Assessment & Plan Note (Signed)
Will give her short course of diflucan if no drug interactions. Should improve with stopping anbx.

## 2012-12-08 NOTE — Assessment & Plan Note (Signed)
Resolved

## 2012-12-11 LAB — GLUCOSE, CAPILLARY
Glucose-Capillary: 169 mg/dL — ABNORMAL HIGH (ref 70–99)
Glucose-Capillary: 184 mg/dL — ABNORMAL HIGH (ref 70–99)
Glucose-Capillary: 182 mg/dL — ABNORMAL HIGH (ref 70–99)
Glucose-Capillary: 170 mg/dL — ABNORMAL HIGH (ref 70–99)
Glucose-Capillary: 117 mg/dL — ABNORMAL HIGH (ref 70–99)
Glucose-Capillary: 152 mg/dL — ABNORMAL HIGH (ref 70–99)
Glucose-Capillary: 157 mg/dL — ABNORMAL HIGH (ref 70–99)
Glucose-Capillary: 85 mg/dL (ref 70–99)
Glucose-Capillary: 100 mg/dL — ABNORMAL HIGH (ref 70–99)

## 2012-12-11 NOTE — Progress Notes (Signed)
Patient ID: Mary [name], female   DOB: 1946/02/13, 67 y.o.   MRN: [id number]           PROGRESS NOTE  DATE:  12/04/2012  FACILITY: [facility]   LEVEL OF CARE:   SNF   Acute Visit   CHIEF COMPLAINT:  Follow up right stump.    HISTORY OF PRESENT ILLNESS:  I saw Mary [name] last week for the first time.  She had had a below-knee amputation for osteomyelitis in the right heel.  She came to [place] on Levaquin.  There was some lighter discoloration of the distal part of the right stump and I am following up on this today.    PHYSICAL EXAMINATION:   SKIN:  INSPECTION:  Right stump:  The incision line is approximated.  There is still some erythema here, but absolutely no tenderness or warmth.  Her vascular supply seems to be adequate.    ASSESSMENT/PLAN:  Right stump infection.  I believe this to be stable.  I am simply following this clinically.  I see no reason  currently for additional antibiotic therapy.     CPT CODE: 62376

## 2012-12-12 LAB — GLUCOSE, CAPILLARY
Glucose-Capillary: 171 mg/dL — ABNORMAL HIGH (ref 70–99)
Glucose-Capillary: 137 mg/dL — ABNORMAL HIGH (ref 70–99)
Glucose-Capillary: 117 mg/dL — ABNORMAL HIGH (ref 70–99)
Glucose-Capillary: 125 mg/dL — ABNORMAL HIGH (ref 70–99)

## 2012-12-13 LAB — GLUCOSE, CAPILLARY: Glucose-Capillary: 193 mg/dL — ABNORMAL HIGH (ref 70–99)

## 2012-12-14 ENCOUNTER — Non-Acute Institutional Stay (SKILLED_NURSING_FACILITY): Payer: Medicare Other | Admitting: Internal Medicine

## 2012-12-14 DIAGNOSIS — S88119A Complete traumatic amputation at level between knee and ankle, unspecified lower leg, initial encounter: Secondary | ICD-10-CM

## 2012-12-14 DIAGNOSIS — Z89511 Acquired absence of right leg below knee: Secondary | ICD-10-CM

## 2012-12-14 LAB — GLUCOSE, CAPILLARY
Glucose-Capillary: 222 mg/dL — ABNORMAL HIGH (ref 70–99)
Glucose-Capillary: 164 mg/dL — ABNORMAL HIGH (ref 70–99)

## 2012-12-15 LAB — GLUCOSE, CAPILLARY
Glucose-Capillary: 173 mg/dL — ABNORMAL HIGH (ref 70–99)
Glucose-Capillary: 227 mg/dL — ABNORMAL HIGH (ref 70–99)

## 2012-12-16 LAB — GLUCOSE, CAPILLARY
Glucose-Capillary: 217 mg/dL — ABNORMAL HIGH (ref 70–99)
Glucose-Capillary: 155 mg/dL — ABNORMAL HIGH (ref 70–99)

## 2012-12-17 LAB — GLUCOSE, CAPILLARY
Glucose-Capillary: 264 mg/dL — ABNORMAL HIGH (ref 70–99)
Glucose-Capillary: 193 mg/dL — ABNORMAL HIGH (ref 70–99)

## 2012-12-18 LAB — GLUCOSE, CAPILLARY
Glucose-Capillary: 107 mg/dL — ABNORMAL HIGH (ref 70–99)
Glucose-Capillary: 230 mg/dL — ABNORMAL HIGH (ref 70–99)
Glucose-Capillary: 127 mg/dL — ABNORMAL HIGH (ref 70–99)

## 2012-12-19 LAB — GLUCOSE, CAPILLARY
Glucose-Capillary: 197 mg/dL — ABNORMAL HIGH (ref 70–99)
Glucose-Capillary: 122 mg/dL — ABNORMAL HIGH (ref 70–99)
Glucose-Capillary: 116 mg/dL — ABNORMAL HIGH (ref 70–99)

## 2012-12-20 LAB — GLUCOSE, CAPILLARY
Glucose-Capillary: 208 mg/dL — ABNORMAL HIGH (ref 70–99)
Glucose-Capillary: 116 mg/dL — ABNORMAL HIGH (ref 70–99)

## 2012-12-21 LAB — GLUCOSE, CAPILLARY
Glucose-Capillary: 162 mg/dL — ABNORMAL HIGH (ref 70–99)
Glucose-Capillary: 141 mg/dL — ABNORMAL HIGH (ref 70–99)
Glucose-Capillary: 166 mg/dL — ABNORMAL HIGH (ref 70–99)

## 2012-12-22 LAB — GLUCOSE, CAPILLARY
Glucose-Capillary: 65 mg/dL — ABNORMAL LOW (ref 70–99)
Glucose-Capillary: 67 mg/dL — ABNORMAL LOW (ref 70–99)
Glucose-Capillary: 128 mg/dL — ABNORMAL HIGH (ref 70–99)

## 2012-12-23 ENCOUNTER — Encounter: Payer: Self-pay | Admitting: Internal Medicine

## 2012-12-23 DIAGNOSIS — Z89511 Acquired absence of right leg below knee: Secondary | ICD-10-CM | POA: Insufficient documentation

## 2012-12-23 NOTE — Progress Notes (Signed)
Patient ID: Mary [name], female   DOB: March 02, 1946, 67 y.o.   MRN: [id number]  This is an acute visit on 12/14/2012.  Facility [facility].  Level of care skilled.  Chief complaint-acute visit followup right stump issues.  History of present illness.  Patient is a pleasant middle-aged female with a recent history of a right BKA secondary to osteomyelitis-she has been treated with antibiotics-she has asked me to evaluate her right stump area today.  She's been afebrile she's not really having any pain earlier her stay there was some mild erythema here I am following up on this.  Physical exam.  The right stump appears quite unremarkable there is some dried crusting-- scattered small open areas with no active drainage-- the erythema is improved-- this looks to be quite unremarkable  Assessment and plan.  #1-history of right below the knee amputation-this appears to be quite unremarkable I do not see any sign of infection continue to monitor--I did reassure the patient about this.  [id number]

## 2012-12-24 LAB — GLUCOSE, CAPILLARY
Glucose-Capillary: 166 mg/dL — ABNORMAL HIGH (ref 70–99)
Glucose-Capillary: 197 mg/dL — ABNORMAL HIGH (ref 70–99)
Glucose-Capillary: 265 mg/dL — ABNORMAL HIGH (ref 70–99)
Glucose-Capillary: 151 mg/dL — ABNORMAL HIGH (ref 70–99)

## 2012-12-25 LAB — GLUCOSE, CAPILLARY
Glucose-Capillary: 122 mg/dL — ABNORMAL HIGH (ref 70–99)
Glucose-Capillary: 239 mg/dL — ABNORMAL HIGH (ref 70–99)

## 2012-12-26 LAB — GLUCOSE, CAPILLARY: Glucose-Capillary: 176 mg/dL — ABNORMAL HIGH (ref 70–99)

## 2012-12-27 LAB — GLUCOSE, CAPILLARY
Glucose-Capillary: 189 mg/dL — ABNORMAL HIGH (ref 70–99)
Glucose-Capillary: 155 mg/dL — ABNORMAL HIGH (ref 70–99)
Glucose-Capillary: 250 mg/dL — ABNORMAL HIGH (ref 70–99)
Glucose-Capillary: 179 mg/dL — ABNORMAL HIGH (ref 70–99)

## 2012-12-28 LAB — GLUCOSE, CAPILLARY

## 2012-12-29 LAB — GLUCOSE, CAPILLARY
Glucose-Capillary: 176 mg/dL — ABNORMAL HIGH (ref 70–99)
Glucose-Capillary: 77 mg/dL (ref 70–99)
Glucose-Capillary: 122 mg/dL — ABNORMAL HIGH (ref 70–99)
Glucose-Capillary: 108 mg/dL — ABNORMAL HIGH (ref 70–99)

## 2012-12-30 LAB — GLUCOSE, CAPILLARY: Glucose-Capillary: 112 mg/dL — ABNORMAL HIGH (ref 70–99)

## 2012-12-31 LAB — GLUCOSE, CAPILLARY
Glucose-Capillary: 128 mg/dL — ABNORMAL HIGH (ref 70–99)
Glucose-Capillary: 106 mg/dL — ABNORMAL HIGH (ref 70–99)
Glucose-Capillary: 116 mg/dL — ABNORMAL HIGH (ref 70–99)
Glucose-Capillary: 220 mg/dL — ABNORMAL HIGH (ref 70–99)

## 2013-01-01 LAB — GLUCOSE, CAPILLARY: Glucose-Capillary: 189 mg/dL — ABNORMAL HIGH (ref 70–99)

## 2013-01-02 LAB — GLUCOSE, CAPILLARY
Glucose-Capillary: 174 mg/dL — ABNORMAL HIGH (ref 70–99)
Glucose-Capillary: 121 mg/dL — ABNORMAL HIGH (ref 70–99)
Glucose-Capillary: 119 mg/dL — ABNORMAL HIGH (ref 70–99)

## 2013-01-03 ENCOUNTER — Encounter: Payer: Self-pay | Admitting: Infectious Diseases

## 2013-01-03 ENCOUNTER — Non-Acute Institutional Stay (SKILLED_NURSING_FACILITY): Payer: Medicare Other | Admitting: Internal Medicine

## 2013-01-03 ENCOUNTER — Encounter: Payer: Self-pay | Admitting: Internal Medicine

## 2013-01-03 DIAGNOSIS — I10 Essential (primary) hypertension: Secondary | ICD-10-CM

## 2013-01-03 DIAGNOSIS — S88119A Complete traumatic amputation at level between knee and ankle, unspecified lower leg, initial encounter: Secondary | ICD-10-CM

## 2013-01-03 DIAGNOSIS — G2581 Restless legs syndrome: Secondary | ICD-10-CM

## 2013-01-03 DIAGNOSIS — I739 Peripheral vascular disease, unspecified: Secondary | ICD-10-CM

## 2013-01-03 DIAGNOSIS — E119 Type 2 diabetes mellitus without complications: Secondary | ICD-10-CM

## 2013-01-03 DIAGNOSIS — Z89511 Acquired absence of right leg below knee: Secondary | ICD-10-CM

## 2013-01-03 DIAGNOSIS — D649 Anemia, unspecified: Secondary | ICD-10-CM

## 2013-01-03 LAB — GLUCOSE, CAPILLARY
Glucose-Capillary: 161 mg/dL — ABNORMAL HIGH (ref 70–99)
Glucose-Capillary: 125 mg/dL — ABNORMAL HIGH (ref 70–99)

## 2013-01-03 NOTE — Progress Notes (Signed)
Patient ID: Mary [name], female   DOB: 06-24-1945, 67 y.o.   MRN: [id number]  This encounter was created in error - please disregard.

## 2013-01-04 LAB — GLUCOSE, CAPILLARY
Glucose-Capillary: 176 mg/dL — ABNORMAL HIGH (ref 70–99)
Glucose-Capillary: 64 mg/dL — ABNORMAL LOW (ref 70–99)
Glucose-Capillary: 157 mg/dL — ABNORMAL HIGH (ref 70–99)

## 2013-01-11 ENCOUNTER — Ambulatory Visit (HOSPITAL_COMMUNITY): Payer: Medicare Other | Admitting: Physical Therapy

## 2013-01-16 ENCOUNTER — Ambulatory Visit (HOSPITAL_COMMUNITY)
Admission: RE | Admit: 2013-01-16 | Discharge: 2013-01-16 | Disposition: A | Discharge status: Home / Self Care | Payer: Medicare Other | Source: Ambulatory Visit | Attending: Internal Medicine | Admitting: Internal Medicine

## 2013-01-16 DIAGNOSIS — L089 Local infection of the skin and subcutaneous tissue, unspecified: Secondary | ICD-10-CM | POA: Insufficient documentation

## 2013-01-16 DIAGNOSIS — IMO0001 Reserved for inherently not codable concepts without codable children: Secondary | ICD-10-CM | POA: Insufficient documentation

## 2013-01-16 DIAGNOSIS — R2689 Other abnormalities of gait and mobility: Secondary | ICD-10-CM | POA: Insufficient documentation

## 2013-01-16 DIAGNOSIS — E119 Type 2 diabetes mellitus without complications: Secondary | ICD-10-CM | POA: Insufficient documentation

## 2013-01-16 DIAGNOSIS — T8189XA Other complications of procedures, not elsewhere classified, initial encounter: Secondary | ICD-10-CM | POA: Insufficient documentation

## 2013-01-16 DIAGNOSIS — R262 Difficulty in walking, not elsewhere classified: Secondary | ICD-10-CM | POA: Insufficient documentation

## 2013-01-16 DIAGNOSIS — Y849 Medical procedure, unspecified as the cause of abnormal reaction of the patient, or of later complication, without mention of misadventure at the time of the procedure: Secondary | ICD-10-CM | POA: Insufficient documentation

## 2013-01-16 NOTE — Evaluation (Signed)
Physical Therapy Evaluation  Patient Details  Name: Mary [name] MRN: [id number] Date of Birth: 1946/04/26  Today's Date: 01/16/2013 Time: 0810-0853 PT Time Calculation (min): 43 min Charge: Evaluation             Visit#: 1 of 24  Re-eval: 02/15/13 Assessment Diagnosis: R BKA Surgical Date: 11/25/12  Authorization: [employer]     Past Medical History:  Past Medical History  Diagnosis Date  . CHF (congestive heart failure)   . Diabetes mellitus   . Restless leg syndrome 07/25/2011  . Wound, open     right foot  . Diabetes mellitus with neuropathy 07/25/2011  . Cellulitis and abscess of foot 02/12/2012  . Osteomyelitis of right foot 02/14/2012  . Partial Achilles tendon tear 02/14/2012  . Dysrhythmia     tachy- takes Metoprolol  . OSA (obstructive sleep apnea) 07/25/2011    not using CPAP  . Arthritis     knees  . Anemia    Past Surgical History:  Past Surgical History  Procedure Laterality Date  . Abdominal hysterectomy    . Achilles tendon repair    . Toe amputation      partial rt great toe  . Breast surgery Left     Lumpectomy non ca  . Below knee leg amputation Right 11/25/2012    Dr Sharol Given  . Amputation Right 11/25/2012    Procedure: AMPUTATION BELOW KNEE;  Surgeon: Newt Minion, MD;  Location: No Name;  Service: Orthopedics;  Laterality: Right;  Right Below Knee Amputation    Subjective Symptoms/Limitations Symptoms: Ms. Prochnow had poorly controlled DM and a chronic Rt heel wound that progressed to osteomyolitis and recieved a BKA on 11/25/2012. She was discharged to SNF on the 30th and stayed until  01/03/2013.  She states she is doing better but she still is fearful of falling.  Her husband has had several back surgeries and can not offer very much assistance.  She is being referred to therapy for continued strengthening and preparation for her prothesis which she has had not recieved at this time.  The pateint states that her incision has opened up.   She is doing a home exercise program that was given to her at [facility].   How long can you sit comfortably?: no problem How long can you stand comfortably?: Pt has not stood without her prothesis  How long can you walk comfortably?: Walk with a walker for 21 feet. Pain Assessment Currently in Pain?: No/denies  Precautions/Restrictions  Precautions Precautions: Fall  Balance Screening Balance Screen Has the patient fallen in the past 6 months: No  Prior Functioning     Cognition/Observation Cognition Overall Cognitive Status: Within Functional Limits for tasks assessed Observation/Other Assessments Observations:  (Pt has open wound on lateral aspect 5 cm x 1 cm with ??depth as there is only 5% granulation 95% slough Other Assessments:  (2 medial wounds  one 1.2 cm diameter; 2nd is 2x3cm depth  Unknown as there is 100% slough on these wounds no granulation. Distal third of stump is red circumferentially    Assessment RLE Strength Right Hip Flexion: 5/5 Right Hip Extension: 4/5 Right Hip ABduction: 3+/5 Right Hip ADduction: 3+/5 Right Knee Flexion: 4/5 Right Knee Extension: 5/5 LLE Strength Left Hip Flexion: 4/5 Left Hip Extension: 4/5 Left Hip ABduction: 3+/5 Left Hip ADduction: 3+/5 Left Knee Flexion: 4/5 Left Knee Extension: 5/5 Left Ankle Dorsiflexion: 4/5 Left Ankle Plantar Flexion: 3+/5  Exercise/Treatments Mobility/Balance  Transfers Transfers: Sit to Stand;Stand  Pivot Transfers Sit to Stand: 5: Supervision Stand Pivot Transfers: 4: Min guard Ambulation/Gait Ambulation/Gait: Yes Ambulation Distance (Feet):  (10) Assistive device: Rolling walker    Physical Therapy Assessment and Plan PT Assessment and Plan Clinical Impression Statement: Pt is a 67 yo female with a recent Rt BKA who was released from SNF last week to home.  The patient has open wounds on her stump, decreased activity tolerance, difficulty ambulating, decreased balance and strength.  She will  need skilled therapy to promote a healing enviornment for her open wounds, and address the forementioned problems.   Pt will benefit from skilled therapeutic intervention in order to improve on the following deficits: Decreased activity tolerance;Decreased balance;Decreased knowledge of use of DME;Decreased mobility;Decreased safety awareness;Decreased skin integrity;Decreased strength;Difficulty walking;Increased edema;Obesity Rehab Potential: Fair PT Frequency: Min 3X/week PT Duration: 8 weeks PT Treatment/Interventions: DME instruction;Gait training;Functional mobility training;Therapeutic exercise;Balance training;Patient/family education;Other (comment) PT Plan: The primary concern for this patient at this time is wound care.  If time permits pt will also benefit from bed mobility (rolling); Gt training, balance and general strengthening.  Pt will benefit from pulse lavage and debridement to wound areas.    Goals Home Exercise Program Pt/caregiver will Perform Home Exercise Program: For increased strengthening PT Goal: Perform Home Exercise Program - Progress: Goal set today PT Short Term Goals Time to Complete Short Term Goals: 2 weeks PT Short Term Goal 1: Wound to be 80% granulated PT Short Term Goal 2: Pt to be able to ambulate with RW x 50 ft. PT Short Term Goal 3: Pt to be able to verbalize the importance of keeping her sugar under control in order to keep from becoming an AKA vs. BKA PT Long Term Goals Time to Complete Long Term Goals: 8 weeks PT Long Term Goal 1: Pt wound to be healed PT Long Term Goal 2: Pt to be wearing her shrinker daily Long Term Goal 3: Pt to be able to don and doff prothesis Long Term Goal 4: Pt to be able to walk I with prothesis with least assistive devise x 300 ft  PT Long Term Goal 5: Pt to verbalize the importance of inspecting Rt stump and Lt feet on a daily basis. Additional PT Long Term Goals?: Yes PT Long Term Goal 6: Pt to be able to step up and  off a curb I with prothesis and least assistive device.  Problem List Patient Active Problem List   Diagnosis Date Noted  . Unstable balance 01/16/2013  . Difficulty in walking(719.7) 01/16/2013  . Infected wound 01/16/2013  . Hx of right BKA 12/23/2012  . Candidiasis of breast 12/08/2012  . S/P bilateral BKA (below knee amputation) 11/30/2012  . Diabetes 11/30/2012  . Diabetic foot ulcer 10/08/2012  . UTI (lower urinary tract infection) 10/07/2012  . Bacteremia due to Staphylococcus aureus 10/07/2012  . Anxiety 02/17/2012  . Osteomyelitis of right foot 02/14/2012  . Hematuria, microscopic 02/13/2012  . Hyponatremia 02/13/2012  . PVD (peripheral vascular disease) 02/08/2012  . Open wound of heel 02/08/2012  . Hyperlipidemia 11/11/2011  . Hypertension 11/11/2011  . Morbid obesity 11/11/2011  . Tachycardia 08/04/2011  . Acute exacerbation of CHF (congestive heart failure) 07/25/2011  . Diabetes mellitus with neuropathy 07/25/2011  . OSA (obstructive sleep apnea) 07/25/2011  . Acute bronchitis 07/25/2011  . Generalized weakness 07/25/2011  . Acute respiratory failure 07/25/2011  . Restless leg syndrome 07/25/2011  . Anemia 07/25/2011    PT - End of Session Equipment Utilized During  Treatment: Gait belt Activity Tolerance: Patient tolerated treatment well General Behavior During Therapy: WFL for tasks assessed/performed PT Plan of Care PT Home Exercise Plan: will give at next treatment Consulted and Agree with Plan of Care: Patient  GP Functional Assessment Tool Used: clinical judgement Functional Limitation: Self care Self Care Current Status [id number]): At least 80 percent but less than 100 percent impaired, limited or restricted Self Care Goal Status [id number]): At least 20 percent but less than 40 percent impaired, limited or restricted  Melodee Lupe,CINDY 01/16/2013, 10:16 AM  Physician Documentation Your signature is required to indicate approval of the treatment plan as  stated above.  Please sign and either send electronically or make a copy of this report for your files and return this physician signed original.   Please mark one 1.__approve of plan  2. ___approve of plan with the following conditions.   ______________________________                                                          _____________________ Physician Signature                                                                                                             Date

## 2013-01-17 ENCOUNTER — Ambulatory Visit (HOSPITAL_COMMUNITY)
Admission: RE | Admit: 2013-01-17 | Discharge: 2013-01-17 | Disposition: A | Discharge status: Home / Self Care | Payer: Medicare Other | Source: Ambulatory Visit | Attending: Internal Medicine | Admitting: Internal Medicine

## 2013-01-17 NOTE — Progress Notes (Addendum)
Physical Therapy Treatment Patient Details  Name: Mary [name] MRN: [id number] Date of Birth: February 25, 1946  Today's Date: 01/17/2013 Time: 1355-1440 PT Time Calculation (min): 45 min Charges: Selective debridement (= or < 20 cm)   Visit#: 2 of 24  Re-eval: 02/15/13  Authorization: [employer]  Authorization Visit#: 2 of 10   Subjective: Symptoms/Limitations Symptoms: Pt states that she lost her balance today and hit the end of her residual limb. No pain currently.  Pain Assessment Currently in Pain?: No/denies   Wound care RLE wounds listed from most lateral to medial 1) 85% yellow slough 15% granulation 2) Narrow open area; difficult to evaluate amount of necrotic tissue 3) 100% yellow slough 4)100% yellow slough    Physical Therapy Assessment and Plan PT Assessment and Plan Clinical Impression Statement: Tx focus on wound care per PT POC. End of residual limb is erythematic and all wounds present with significant amount of yellow slough. Wounds were cleansed and then debrided with forceps. Pt tolerates debridement well. Able to remove good amount of necrotic tissue. Pulsed lavage completed to all wounds. All wounds were packed with saline soaked gauze and covered with ABD pad, kerlex and coban. Unable to complete any strengthening activities today secondary to time. Pt will benefit from skilled therapeutic intervention in order to improve on the following deficits: Decreased activity tolerance;Decreased balance;Decreased knowledge of use of DME;Decreased mobility;Decreased safety awareness;Decreased skin integrity;Decreased strength;Difficulty walking;Increased edema;Obesity Rehab Potential: Fair PT Frequency: Min 3X/week PT Duration: 8 weeks PT Treatment/Interventions: DME instruction;Gait training;Functional mobility training;Therapeutic exercise;Balance training;Patient/family education;Other (comment) PT Plan: The primary concern for this patient at this  time is wound care.  If time permits pt will also benefit from bed mobility (rolling); Gt training, balance and general strengthening.  Pt will benefit from pulse lavage and debridement to wound areas. Issue HEP next session.    Problem List Patient Active Problem List   Diagnosis Date Noted  . Unstable balance 01/16/2013  . Difficulty in walking(719.7) 01/16/2013  . Infected wound 01/16/2013  . Hx of right BKA 12/23/2012  . Candidiasis of breast 12/08/2012  . S/P bilateral BKA (below knee amputation) 11/30/2012  . Diabetes 11/30/2012  . Diabetic foot ulcer 10/08/2012  . UTI (lower urinary tract infection) 10/07/2012  . Bacteremia due to Staphylococcus aureus 10/07/2012  . Anxiety 02/17/2012  . Osteomyelitis of right foot 02/14/2012  . Hematuria, microscopic 02/13/2012  . Hyponatremia 02/13/2012  . PVD (peripheral vascular disease) 02/08/2012  . Open wound of heel 02/08/2012  . Hyperlipidemia 11/11/2011  . Hypertension 11/11/2011  . Morbid obesity 11/11/2011  . Tachycardia 08/04/2011  . Acute exacerbation of CHF (congestive heart failure) 07/25/2011  . Diabetes mellitus with neuropathy 07/25/2011  . OSA (obstructive sleep apnea) 07/25/2011  . Acute bronchitis 07/25/2011  . Generalized weakness 07/25/2011  . Acute respiratory failure 07/25/2011  . Restless leg syndrome 07/25/2011  . Anemia 07/25/2011    PT - End of Session Equipment Utilized During Treatment: Gait belt Activity Tolerance: Patient tolerated treatment well General Behavior During Therapy: [facility] for tasks assessed/performed  Rachelle Hora, PTA 01/17/2013, 5:15 PM

## 2013-01-23 ENCOUNTER — Ambulatory Visit (HOSPITAL_COMMUNITY)
Admission: RE | Admit: 2013-01-23 | Discharge: 2013-01-23 | Disposition: A | Discharge status: Home / Self Care | Payer: Medicare Other | Source: Ambulatory Visit | Attending: Internal Medicine | Admitting: Internal Medicine

## 2013-01-23 NOTE — Progress Notes (Signed)
Physical Therapy Treatment Patient Details  Name: Mary [name] MRN: [id number] Date of Birth: 03-07-1946  Today's Date: 01/23/2013 Time: 0850-0930 PT Time Calculation (min): 40 min Charges: Selective debridement (= or < 20 cm)  Visit#: 3 of 24  Re-eval: 02/15/13  Authorization: [employer]  Authorization Time Period:    Authorization Visit#: 3 of 10   Subjective: Symptoms/Limitations Symptoms: Pt states that coban did not stay on well after last session. MD was pleased with wound healing. Pain Assessment Currently in Pain?: No/denies  Wound care RLE wounds listed from most lateral to medial  1) 80% yellow slough 20% granulation  2) Narrow open area; difficult to evaluate amount of necrotic tissue  3) 90% yellow slough 10% granulation 4) 95%% yellow slough 5% granulation  Physical Therapy Assessment and Plan PT Assessment and Plan Clinical Impression Statement: Tx focus continues to be on wound care. All wounds were cleansed and debrided. Pulsed lavage also completed to all wounds. Wounds were packed with saline/hydrogel soaked gauze to improve moisture and maintain healthy healing environment. Wound covered with ABD pads and wrapped with kerlex and ace bandage. Wounds present with increased granulation. Pt is becoming more sensitive to debridement ad nerves return. Erythema has decreased. Educated pt on importance of completing exercises at home. Encouraged pt to stand multiple times a day for as long as tolerable with assistance. Pt will benefit from skilled therapeutic intervention in order to improve on the following deficits: Decreased activity tolerance;Decreased balance;Decreased knowledge of use of DME;Decreased mobility;Decreased safety awareness;Decreased skin integrity;Decreased strength;Difficulty walking;Increased edema;Obesity Rehab Potential: Fair PT Frequency: Min 3X/week PT Duration: 8 weeks PT Treatment/Interventions: DME instruction;Gait  training;Functional mobility training;Therapeutic exercise;Balance training;Patient/family education;Other (comment) PT Plan: The primary concern for this patient at this time is wound care.  If time permits pt will also benefit from bed mobility (rolling); Gt training, balance and general strengthening.  Pt will benefit from pulse lavage and debridement to wound areas.     Problem List Patient Active Problem List   Diagnosis Date Noted  . Unstable balance 01/16/2013  . Difficulty in walking(719.7) 01/16/2013  . Infected wound 01/16/2013  . Hx of right BKA 12/23/2012  . Candidiasis of breast 12/08/2012  . S/P bilateral BKA (below knee amputation) 11/30/2012  . Diabetes 11/30/2012  . Diabetic foot ulcer 10/08/2012  . UTI (lower urinary tract infection) 10/07/2012  . Bacteremia due to Staphylococcus aureus 10/07/2012  . Anxiety 02/17/2012  . Osteomyelitis of right foot 02/14/2012  . Hematuria, microscopic 02/13/2012  . Hyponatremia 02/13/2012  . PVD (peripheral vascular disease) 02/08/2012  . Open wound of heel 02/08/2012  . Hyperlipidemia 11/11/2011  . Hypertension 11/11/2011  . Morbid obesity 11/11/2011  . Tachycardia 08/04/2011  . Acute exacerbation of CHF (congestive heart failure) 07/25/2011  . Diabetes mellitus with neuropathy 07/25/2011  . OSA (obstructive sleep apnea) 07/25/2011  . Acute bronchitis 07/25/2011  . Generalized weakness 07/25/2011  . Acute respiratory failure 07/25/2011  . Restless leg syndrome 07/25/2011  . Anemia 07/25/2011    PT - End of Session Equipment Utilized During Treatment: Gait belt Activity Tolerance: Patient tolerated treatment well General Behavior During Therapy: [facility] for tasks assessed/performed PT Plan of Care PT Home Exercise Plan: Encouraged standing with assistance as much as possible at home.  Rachelle Hora, PTA 01/23/2013, 10:49 AM

## 2013-01-24 ENCOUNTER — Ambulatory Visit (HOSPITAL_COMMUNITY)
Admission: RE | Admit: 2013-01-24 | Discharge: 2013-01-24 | Disposition: A | Discharge status: Home / Self Care | Payer: Medicare Other | Source: Ambulatory Visit | Attending: Internal Medicine | Admitting: Internal Medicine

## 2013-01-24 DIAGNOSIS — R2689 Other abnormalities of gait and mobility: Secondary | ICD-10-CM

## 2013-01-24 DIAGNOSIS — R262 Difficulty in walking, not elsewhere classified: Secondary | ICD-10-CM

## 2013-01-24 NOTE — Progress Notes (Addendum)
Physical Therapy Treatment Patient Details  Name: Mary [name] MRN: [id number] Date of Birth: [birth year]  Today's Date: 01/24/2013 Time: [id number] PT Time Calculation (min): 70 min Charge; Selective debridement <20 cm [phone]), TA 0935-1005  Visit#: 4 of 24  Re-eval: 02/15/13 Assessment Diagnosis: R BKA Surgical Date: 11/25/12  Authorization: [employer]  Authorization Time Period:    Authorization Visit#: 4 of 10   Subjective: Symptoms/Limitations Symptoms: No pain, pt stated dressings continue to fall off at night.   Pain Assessment Currently in Pain?: No/denies  Precautions/Restrictions  Precautions Precautions: Fall  Wound care  RLE wounds listed from most lateral to medial  1) 70% yellow slough 30% granulation  2) Narrow open area; difficult to evaluate amount of necrotic tissue  3) 90% yellow slough 10% granulation  4) 95%% yellow slough 5% granulation  Exercise/Treatments Standing Other Standing Knee Exercises: Standing with Lt UE on RW 3 reps with 2 min max Other Standing Knee Exercises: Standing with Rt UE on RW 3 reps with 1\' 45"  Seated Long Arc Quad: Both;2 sets;10 reps Other Seated Knee Exercises: chair push-ups 2 sets/10 reps x 10" holds; H/S curls 10x Other Seated Knee Exercises: Sit to stand from wc with HHA 5 reps     Physical Therapy Assessment and Plan PT Assessment and Plan Clinical Impression Statement: Continued wound care to all wounds on Rt LE with pulsed lavage and debridement for removal of slough to promote healing. Wounds were packed with saline and hydrogel soaked gauze to improve moisture and maintain healthy healing environement. Wounds covered with gauze and ace bandage with paper tape on proximal Rt LE and gauze to help keep dressings intact. Pt reported no pain with or following debirdement. Began functional strengthening activities to improve overall mobiltiy; , began chair push-up for UE strengthening to assist  wtih sit to stands and standing with least UE support. Pt able to stand with 1 hand assistance x 2 minutes and min assistance and vc-ing to improve weight distribution with balance PT Plan: The primary concern for this patient at this time is wound care.  If time permits pt will also benefit from bed mobility (rolling); Gt training, balance and general strengthening.  Pt will benefit from pulse lavage and debridement to wound areas.    Goals    Problem List Patient Active Problem List   Diagnosis Date Noted  . Unstable balance 01/16/2013  . Difficulty in walking(719.7) 01/16/2013  . Infected wound 01/16/2013  . Hx of right BKA 12/23/2012  . Candidiasis of breast 12/08/2012  . S/P bilateral BKA (below knee amputation) 11/30/2012  . Diabetes 11/30/2012  . Diabetic foot ulcer 10/08/2012  . UTI (lower urinary tract infection) 10/07/2012  . Bacteremia due to Staphylococcus aureus 10/07/2012  . Anxiety 02/17/2012  . Osteomyelitis of right foot 02/14/2012  . Hematuria, microscopic 02/13/2012  . Hyponatremia 02/13/2012  . PVD (peripheral vascular disease) 02/08/2012  . Open wound of heel 02/08/2012  . Hyperlipidemia 11/11/2011  . Hypertension 11/11/2011  . Morbid obesity 11/11/2011  . Tachycardia 08/04/2011  . Acute exacerbation of CHF (congestive heart failure) 07/25/2011  . Diabetes mellitus with neuropathy 07/25/2011  . OSA (obstructive sleep apnea) 07/25/2011  . Acute bronchitis 07/25/2011  . Generalized weakness 07/25/2011  . Acute respiratory failure 07/25/2011  . Restless leg syndrome 07/25/2011  . Anemia 07/25/2011    PT - End of Session Equipment Utilized During Treatment: Gait belt Activity Tolerance: Patient tolerated treatment well General Behavior During Therapy: [facility] for tasks assessed/performed  GP    Aldona Lento 01/24/2013, 4:40 PM

## 2013-01-26 ENCOUNTER — Ambulatory Visit (HOSPITAL_COMMUNITY)
Admission: RE | Admit: 2013-01-26 | Discharge: 2013-01-26 | Disposition: A | Discharge status: Home / Self Care | Payer: Medicare Other | Source: Ambulatory Visit | Attending: Internal Medicine | Admitting: Internal Medicine

## 2013-01-26 DIAGNOSIS — R262 Difficulty in walking, not elsewhere classified: Secondary | ICD-10-CM

## 2013-01-26 DIAGNOSIS — R2689 Other abnormalities of gait and mobility: Secondary | ICD-10-CM

## 2013-01-26 NOTE — Progress Notes (Signed)
Physical Therapy Treatment Patient Details  Name: Mary [name] MRN: [id number] Date of Birth: 06-03-1945  Today's Date: 01/26/2013 Time: 1020-1100 PT Time Calculation (min): 40 min  Visit#: 5 of 24  Re-eval: 02/15/13 Assessment Diagnosis: R BKA Surgical Date: 11/25/12  Authorization: [employer]  Authorization Time Period:    Authorization Visit#: 5 of 10   Subjective: Symptoms/Limitations Symptoms: Pt stated dressings came off in bed last night with noted drainiage to lateral wound.  Dressings were reapplied  Precautions/Restrictions  Precautions Precautions: Fall   Wound care  RLE wounds listed from most lateral to medial  1) 70% yellow slough 30% granulation  2) Narrow open area; difficult to evaluate amount of necrotic tissue  3) 90% yellow slough 10% granulation  4) 95%% yellow slough 5% granulation  Exercise/Treatments Standing Other Standing Knee Exercises: Transfer training with SBA Seated Other Seated Knee Exercises: Sit to stand with HHA      Physical Therapy Assessment and Plan PT Assessment and Plan Clinical Impression Statement: Transfer training complete with SBA and cueing for handplacement for safety.  Pt given HEP worksheet for LE strengthening.  Wound care complete to Rt LE wounds with pulsed lavage and debridement for removal of slough to promote healing.  Wounds were packed with saline and hydrogel soaked gauze to improve moisture and maintain health healing environment  Wound dressed in gauze and acebandage to promote proper shaping of the stump.  No reports of pain through session.   PT Plan: Educated patient on proper wrapping for proper shaping for stump. Continue wound care and functional strengthening activities as time allows to improve bed mobility, gait training, balance and general strengthening.      Goals    Problem List Patient Active Problem List   Diagnosis Date Noted  . Unstable balance 01/16/2013  .  Difficulty in walking(719.7) 01/16/2013  . Infected wound 01/16/2013  . Hx of right BKA 12/23/2012  . Candidiasis of breast 12/08/2012  . S/P bilateral BKA (below knee amputation) 11/30/2012  . Diabetes 11/30/2012  . Diabetic foot ulcer 10/08/2012  . UTI (lower urinary tract infection) 10/07/2012  . Bacteremia due to Staphylococcus aureus 10/07/2012  . Anxiety 02/17/2012  . Osteomyelitis of right foot 02/14/2012  . Hematuria, microscopic 02/13/2012  . Hyponatremia 02/13/2012  . PVD (peripheral vascular disease) 02/08/2012  . Open wound of heel 02/08/2012  . Hyperlipidemia 11/11/2011  . Hypertension 11/11/2011  . Morbid obesity 11/11/2011  . Tachycardia 08/04/2011  . Acute exacerbation of CHF (congestive heart failure) 07/25/2011  . Diabetes mellitus with neuropathy 07/25/2011  . OSA (obstructive sleep apnea) 07/25/2011  . Acute bronchitis 07/25/2011  . Generalized weakness 07/25/2011  . Acute respiratory failure 07/25/2011  . Restless leg syndrome 07/25/2011  . Anemia 07/25/2011    PT - End of Session Equipment Utilized During Treatment: Gait belt Activity Tolerance: Patient tolerated treatment well General Behavior During Therapy: [facility] for tasks assessed/performed  GP    Aldona Lento 01/26/2013, 5:17 PM

## 2013-01-30 ENCOUNTER — Ambulatory Visit (HOSPITAL_COMMUNITY)
Admission: RE | Admit: 2013-01-30 | Discharge: 2013-01-30 | Disposition: A | Discharge status: Home / Self Care | Payer: Medicare Other | Source: Ambulatory Visit | Attending: Internal Medicine | Admitting: Internal Medicine

## 2013-01-30 DIAGNOSIS — IMO0001 Reserved for inherently not codable concepts without codable children: Secondary | ICD-10-CM | POA: Insufficient documentation

## 2013-01-30 DIAGNOSIS — R262 Difficulty in walking, not elsewhere classified: Secondary | ICD-10-CM | POA: Insufficient documentation

## 2013-01-30 DIAGNOSIS — E119 Type 2 diabetes mellitus without complications: Secondary | ICD-10-CM | POA: Insufficient documentation

## 2013-01-30 DIAGNOSIS — Y849 Medical procedure, unspecified as the cause of abnormal reaction of the patient, or of later complication, without mention of misadventure at the time of the procedure: Secondary | ICD-10-CM | POA: Insufficient documentation

## 2013-01-30 DIAGNOSIS — T8189XA Other complications of procedures, not elsewhere classified, initial encounter: Secondary | ICD-10-CM | POA: Insufficient documentation

## 2013-01-30 NOTE — Progress Notes (Signed)
Physical Therapy Treatment Patient Details  Name: Ixayana Dransfield MRN: [id number] Date of Birth: 1946/02/02  Today's Date: 01/30/2013 Time: 0810-0845 PT Time Calculation (min): 35 min Charges: Selective debridement (= or < 20 cm)   Visit#: 6 of 24  Re-eval: 02/15/13  Authorization: [employer]  Authorization Visit#: 6 of 10   Subjective: Symptoms/Limitations Symptoms: Pt states that she has only completed HEP once since last session. Pain Assessment Currently in Pain?: No/denies   Wound care  RLE wounds listed from most lateral to medial  1) 70% yellow slough 30% granulation  2) Narrow open area; difficult to evaluate amount of necrotic tissue  3) 90% yellow slough 10% granulation  4) 95%% yellow slough 5% granulation  Physical Therapy Assessment and Plan PT Assessment and Plan Clinical Impression Statement: Wound care continues to be primary focus. Unable to complete therex secondary to time. Encouraged pt to make goal of standing 3 times a day for as long as she can. Pt is currently doing minimal exercises at home. Also encouraged pt to complete HEP twice a day. Wound care completed to RLE wounds with pulsed lavage and debridement for removal of slough to promote healing. Wounds were packed with saline and hydrogel soaked gauze to improve moisture and maintain health healing environment. Residual limb wrapped with kerlex and ace bandage to anchor dressings. PT Plan: Continue wound care and functional strengthening activities as time allows to improve bed mobility, gait training, balance and general strengthening.       Problem List Patient Active Problem List   Diagnosis Date Noted  . Unstable balance 01/16/2013  . Difficulty in walking(719.7) 01/16/2013  . Infected wound 01/16/2013  . Hx of right BKA 12/23/2012  . Candidiasis of breast 12/08/2012  . S/P bilateral BKA (below knee amputation) 11/30/2012  . Diabetes 11/30/2012  . Diabetic foot ulcer  10/08/2012  . UTI (lower urinary tract infection) 10/07/2012  . Bacteremia due to Staphylococcus aureus 10/07/2012  . Anxiety 02/17/2012  . Osteomyelitis of right foot 02/14/2012  . Hematuria, microscopic 02/13/2012  . Hyponatremia 02/13/2012  . PVD (peripheral vascular disease) 02/08/2012  . Open wound of heel 02/08/2012  . Hyperlipidemia 11/11/2011  . Hypertension 11/11/2011  . Morbid obesity 11/11/2011  . Tachycardia 08/04/2011  . Acute exacerbation of CHF (congestive heart failure) 07/25/2011  . Diabetes mellitus with neuropathy 07/25/2011  . OSA (obstructive sleep apnea) 07/25/2011  . Acute bronchitis 07/25/2011  . Generalized weakness 07/25/2011  . Acute respiratory failure 07/25/2011  . Restless leg syndrome 07/25/2011  . Anemia 07/25/2011    PT - End of Session Equipment Utilized During Treatment: Gait belt Activity Tolerance: Patient tolerated treatment well General Behavior During Therapy: [facility] for tasks assessed/performed  Rachelle Hora, PTA  01/30/2013, 9:53 AM

## 2013-02-01 ENCOUNTER — Ambulatory Visit (HOSPITAL_COMMUNITY)
Admission: RE | Admit: 2013-02-01 | Discharge: 2013-02-01 | Disposition: A | Discharge status: Home / Self Care | Payer: Medicare Other | Source: Ambulatory Visit | Attending: Internal Medicine | Admitting: Internal Medicine

## 2013-02-01 DIAGNOSIS — R262 Difficulty in walking, not elsewhere classified: Secondary | ICD-10-CM

## 2013-02-01 DIAGNOSIS — R2689 Other abnormalities of gait and mobility: Secondary | ICD-10-CM

## 2013-02-01 NOTE — Progress Notes (Signed)
Physical Therapy Treatment Patient Details  Name: Mary [name] MRN: [id number] Date of Birth: [birth year]  Today's Date: 02/01/2013 Time: 0805-0845 PT Time Calculation (min): 40 min Charge:  Selective debridement <20 cm, TA [phone]  Visit#: 7 of 24  Re-eval: 02/15/13 Assessment Diagnosis: R BKA Surgical Date: 11/25/12  Authorization: [employer]  Authorization Time Period:    Authorization Visit#: 7 of 10   Subjective: Symptoms/Limitations Symptoms: Pt reports pain free, has been compliant with HEP.  Reports standing beside bed with 1 HHA Pain Assessment Currently in Pain?: No/denies  Precautions/Restrictions  Precautions Precautions: Fall  Wound care  RLE wounds listed from most lateral to medial  1) 70% yellow slough 30% granulation  2) Narrow open area; difficult to evaluate amount of necrotic tissue  3) 90% yellow slough 10% granulation  4) 95%% yellow slough 5% granulation  Exercise/Treatments Standing Other Standing Knee Exercises: Transfer training with SBA    Physical Therapy Assessment and Plan PT Assessment and Plan Clinical Impression Statement: Wound improveing in granulation with noted decreased depth to lateral wound.  Continued packing wounds with saline and hydrogel with vaseline applied periwound for healthy skin integrity.  Dressed with gauze and ace bandage for compression for limb shaping.  Pt and husband educated on proper technique with dressings for limb shaping. PT Plan: Continue wound care and functional strengthening activities as time allows to improve bed mobility, gait training, balance and general strengthening.      Goals    Problem List Patient Active Problem List   Diagnosis Date Noted  . Unstable balance 01/16/2013  . Difficulty in walking(719.7) 01/16/2013  . Infected wound 01/16/2013  . Hx of right BKA 12/23/2012  . Candidiasis of breast 12/08/2012  . S/P bilateral BKA (below knee amputation)  11/30/2012  . Diabetes 11/30/2012  . Diabetic foot ulcer 10/08/2012  . UTI (lower urinary tract infection) 10/07/2012  . Bacteremia due to Staphylococcus aureus 10/07/2012  . Anxiety 02/17/2012  . Osteomyelitis of right foot 02/14/2012  . Hematuria, microscopic 02/13/2012  . Hyponatremia 02/13/2012  . PVD (peripheral vascular disease) 02/08/2012  . Open wound of heel 02/08/2012  . Hyperlipidemia 11/11/2011  . Hypertension 11/11/2011  . Morbid obesity 11/11/2011  . Tachycardia 08/04/2011  . Acute exacerbation of CHF (congestive heart failure) 07/25/2011  . Diabetes mellitus with neuropathy 07/25/2011  . OSA (obstructive sleep apnea) 07/25/2011  . Acute bronchitis 07/25/2011  . Generalized weakness 07/25/2011  . Acute respiratory failure 07/25/2011  . Restless leg syndrome 07/25/2011  . Anemia 07/25/2011    PT - End of Session Equipment Utilized During Treatment: Gait belt Activity Tolerance: Patient tolerated treatment well General Behavior During Therapy: [facility] for tasks assessed/performed  GP    Aldona Lento 02/01/2013, 8:59 AM

## 2013-02-02 ENCOUNTER — Ambulatory Visit (HOSPITAL_COMMUNITY)
Admission: RE | Admit: 2013-02-02 | Discharge: 2013-02-02 | Disposition: A | Discharge status: Home / Self Care | Payer: Medicare Other | Source: Ambulatory Visit | Attending: Internal Medicine | Admitting: Internal Medicine

## 2013-02-02 DIAGNOSIS — R2689 Other abnormalities of gait and mobility: Secondary | ICD-10-CM

## 2013-02-02 DIAGNOSIS — R262 Difficulty in walking, not elsewhere classified: Secondary | ICD-10-CM

## 2013-02-02 NOTE — Progress Notes (Signed)
Physical Therapy Treatment Patient Details  Name: Mary [name] MRN: [id number] Date of Birth: [birth year]  Today's Date: 02/02/2013 Time: [id number] PT Time Calculation (min): 25 min Charge: Selective debridement < 20 cm [id number], TA [id number]; [id number]  Visit#: 8 of 24  Re-eval: 02/15/13 Assessment Diagnosis: R BKA Surgical Date: 11/25/12  Authorization: [employer]  Authorization Time Period:    Authorization Visit#: 8 of 10   Subjective: Symptoms/Limitations Symptoms: Dressings intact, no reports of pain today Pain Assessment Currently in Pain?: No/denies  Precautions/Restrictions  Precautions Precautions: Fall  Wound care  RLE wounds listed from most lateral to medial  1) 85% granulation, 15% yellow slough; no depth 2) 100% granulation, Narrow open area, unable to tell depth 3) 80% yellow slough 20% granulation  4) 60%% yellow slough 40% granulation  Exercise/Treatments Standing Other Standing Knee Exercises: Transfer training min assistance with mat height elevated    Physical Therapy Assessment and Plan PT Assessment and Plan Clinical Impression Statement: Wounds continue to improve in granulation, decreased redness, decreased edema and improved limb shaping.  Able to remove signifiant amount of slough to medial and lateral wounds.  Continued with saline and hydrogel packing to 2 medial wounds and changed lateral wound dressings to xeroform with moist to moist gauze packing to wound #2.  Continued wtih gauze and ace bandage wrapping to promote correct shaping with limb. PT Plan: Continue wound care and functional strengthening activities as time allows to improve bed mobility, gait training, balance and general strengthening.      Goals    Problem List Patient Active Problem List   Diagnosis Date Noted  . Unstable balance 01/16/2013  . Difficulty in walking(719.7) 01/16/2013  . Infected wound 01/16/2013  . Hx of right BKA 12/23/2012  .  Candidiasis of breast 12/08/2012  . S/P bilateral BKA (below knee amputation) 11/30/2012  . Diabetes 11/30/2012  . Diabetic foot ulcer 10/08/2012  . UTI (lower urinary tract infection) 10/07/2012  . Bacteremia due to Staphylococcus aureus 10/07/2012  . Anxiety 02/17/2012  . Osteomyelitis of right foot 02/14/2012  . Hematuria, microscopic 02/13/2012  . Hyponatremia 02/13/2012  . PVD (peripheral vascular disease) 02/08/2012  . Open wound of heel 02/08/2012  . Hyperlipidemia 11/11/2011  . Hypertension 11/11/2011  . Morbid obesity 11/11/2011  . Tachycardia 08/04/2011  . Acute exacerbation of CHF (congestive heart failure) 07/25/2011  . Diabetes mellitus with neuropathy 07/25/2011  . OSA (obstructive sleep apnea) 07/25/2011  . Acute bronchitis 07/25/2011  . Generalized weakness 07/25/2011  . Acute respiratory failure 07/25/2011  . Restless leg syndrome 07/25/2011  . Anemia 07/25/2011    PT - End of Session Equipment Utilized During Treatment: Gait belt Activity Tolerance: Patient tolerated treatment well General Behavior During Therapy: [facility] for tasks assessed/performed  GP    Aldona Lento 02/02/2013, 9:02 AM

## 2013-02-05 ENCOUNTER — Ambulatory Visit (HOSPITAL_COMMUNITY)
Admission: RE | Admit: 2013-02-05 | Discharge: 2013-02-05 | Disposition: A | Discharge status: Home / Self Care | Payer: Medicare Other | Source: Ambulatory Visit | Attending: Internal Medicine | Admitting: Internal Medicine

## 2013-02-05 NOTE — Progress Notes (Signed)
Physical Therapy Treatment Patient Details  Name: Mary [name] MRN: [id number] Date of Birth: 30-Jun-1945  Today's Date: 02/05/2013 Time: 0805-0845 PT Time Calculation (min): 40 min Visit#: 9 of 24  Re-eval: 02/15/13 Authorization: [employer]  Authorization Visit#: 9 of 10 Charges:  Deb<20cm   Subjective: Symptoms/Limitations Symptoms: Pt states she brought her shrinker with her today.  No pain  Objective: Wound care  RLE wounds listed from most lateral to medial  1) 85% granulation, 15% yellow slough; no depth  2) 100% granulation, Narrow open area, unable to tell depth  3) 80% yellow slough 20% granulation  4) 60%% yellow slough 40% granulation     Physical Therapy Assessment and Plan  PT Assessment and Plan  Clinical Impression Statement: Wounds continue to improve in granulation, decreased redness, decreased edema and improved limb shaping. Able to remove signifiant amount of slough to medial and lateral wounds. Changed to medihoney gel infused gauze packing/dressing to all wounds. Placed shrinker over dressing just below knee to increase compression and promote closure and bandage adherence. Plan: Continue wound care and functional strengthening activities as time allows to improve bed mobility, gait training, balance and general strengthening.   Problem List Patient Active Problem List   Diagnosis Date Noted  . Unstable balance 01/16/2013  . Difficulty in walking(719.7) 01/16/2013  . Infected wound 01/16/2013  . Hx of right BKA 12/23/2012  . Candidiasis of breast 12/08/2012  . S/P bilateral BKA (below knee amputation) 11/30/2012  . Diabetes 11/30/2012  . Diabetic foot ulcer 10/08/2012  . UTI (lower urinary tract infection) 10/07/2012  . Bacteremia due to Staphylococcus aureus 10/07/2012  . Anxiety 02/17/2012  . Osteomyelitis of right foot 02/14/2012  . Hematuria, microscopic 02/13/2012  . Hyponatremia 02/13/2012  . PVD (peripheral vascular  disease) 02/08/2012  . Open wound of heel 02/08/2012  . Hyperlipidemia 11/11/2011  . Hypertension 11/11/2011  . Morbid obesity 11/11/2011  . Tachycardia 08/04/2011  . Acute exacerbation of CHF (congestive heart failure) 07/25/2011  . Diabetes mellitus with neuropathy 07/25/2011  . OSA (obstructive sleep apnea) 07/25/2011  . Acute bronchitis 07/25/2011  . Generalized weakness 07/25/2011  . Acute respiratory failure 07/25/2011  . Restless leg syndrome 07/25/2011  . Anemia 07/25/2011    PT - End of Session Equipment Utilized During Treatment: Gait belt Activity Tolerance: Patient tolerated treatment well General Behavior During Therapy: [facility] for tasks assessed/performed   Teena Irani, PTA/CLT 02/05/2013, 9:40 AM

## 2013-02-07 ENCOUNTER — Ambulatory Visit (HOSPITAL_COMMUNITY)
Admission: RE | Admit: 2013-02-07 | Discharge: 2013-02-07 | Disposition: A | Discharge status: Home / Self Care | Payer: Medicare Other | Source: Ambulatory Visit | Attending: Internal Medicine | Admitting: Internal Medicine

## 2013-02-07 NOTE — Progress Notes (Signed)
Physical Therapy Treatment Patient Details  Name: Mary [name] MRN: [id number] Date of Birth: 1945-12-11  Today's Date: 02/07/2013 Time: 0805-0845 PT Time Calculation (min): 40 min  Visit#: 10 of 24  Re-eval: 02/15/13 Authorization: [employer]  Authorization Visit#: 10 of 20  Charges:  Deb <20cm; G-Code updated today  Subjective: Symptoms/Limitations Symptoms: Pt states the shrinker worked well to keep the wrap in place and with noticable reduction in swelling.  States it itches a little around the wounds.   Objective: Wound care  RLE wounds listed from most lateral to medial  1) 90% granulation, 10% yellow slough; no depth  2) 100% granulation, Narrow open area, unable to tell depth  3) 85% granulation 15% slough  4) 75% granulation 25% slough     Physical Therapy Assessment and Plan PT Assessment and Plan Clinical Impression Statement: Overall improved ability with transfer.  Able to transfer with contact guard without use of AD performing a pivot transfer mat<-->chair.  Pt required verbal cues for UE positioning /assistance during transfer.  Overall improvement in wound beds today with increased granulation since switching to medihoney gel.  Able to debride a large amount using forceps/scapel.  continued dressings with medihoney gel packing/ABD pads and medihoney.  Secured with shrinker. PT Plan: Continue to progress; Pulse lavage as needed due to depth in wound and appropriate dressings.     Problem List Patient Active Problem List   Diagnosis Date Noted  . Unstable balance 01/16/2013  . Difficulty in walking(719.7) 01/16/2013  . Infected wound 01/16/2013  . Hx of right BKA 12/23/2012  . Candidiasis of breast 12/08/2012  . S/P bilateral BKA (below knee amputation) 11/30/2012  . Diabetes 11/30/2012  . Diabetic foot ulcer 10/08/2012  . UTI (lower urinary tract infection) 10/07/2012  . Bacteremia due to Staphylococcus aureus 10/07/2012  . Anxiety  02/17/2012  . Osteomyelitis of right foot 02/14/2012  . Hematuria, microscopic 02/13/2012  . Hyponatremia 02/13/2012  . PVD (peripheral vascular disease) 02/08/2012  . Open wound of heel 02/08/2012  . Hyperlipidemia 11/11/2011  . Hypertension 11/11/2011  . Morbid obesity 11/11/2011  . Tachycardia 08/04/2011  . Acute exacerbation of CHF (congestive heart failure) 07/25/2011  . Diabetes mellitus with neuropathy 07/25/2011  . OSA (obstructive sleep apnea) 07/25/2011  . Acute bronchitis 07/25/2011  . Generalized weakness 07/25/2011  . Acute respiratory failure 07/25/2011  . Restless leg syndrome 07/25/2011  . Anemia 07/25/2011    PT - End of Session Equipment Utilized During Treatment: Gait belt Activity Tolerance: Patient tolerated treatment well General Behavior During Therapy: [facility] for tasks assessed/performed  Functional Assessment Tool Used: clinical judgement Functional Limitation: Self care Self Care Current Status [id number]): At least 60 percent but less than 80 percent impaired, limited or restricted Self Care Goal Status [id number]): At least 20 percent but less than 40 percent impaired, limited or restricted  Teena Irani, PTA/CLT 02/07/2013, 9:30 AM

## 2013-02-09 ENCOUNTER — Ambulatory Visit (HOSPITAL_COMMUNITY)
Admission: RE | Admit: 2013-02-09 | Discharge: 2013-02-09 | Disposition: A | Discharge status: Home / Self Care | Payer: Medicare Other | Source: Ambulatory Visit | Attending: Internal Medicine | Admitting: Internal Medicine

## 2013-02-09 DIAGNOSIS — R2689 Other abnormalities of gait and mobility: Secondary | ICD-10-CM

## 2013-02-09 DIAGNOSIS — R262 Difficulty in walking, not elsewhere classified: Secondary | ICD-10-CM

## 2013-02-09 DIAGNOSIS — T798XXD Other early complications of trauma, subsequent encounter: Secondary | ICD-10-CM

## 2013-02-09 NOTE — Progress Notes (Signed)
Physical Therapy Treatment Patient Details  Name: Mary [name] MRN: [id number] Date of Birth: 12-Jul-1945  Today's Date: 02/09/2013 Time: [id number] PT Time Calculation (min): 39 min Deb: < 20 cm TA: 1137-1145 Visit#: 11 of 24  Re-eval: 02/15/13    Authorization: [employer]  Authorization Time Period:    Authorization Visit#: 11 of 20   Subjective: Symptoms/Limitations Symptoms: Pt states that she somedays she feels like she can walk and other days she feels like she can't do anything at all.  States she is pleased with the shrinker, and frusturated that her wounds are slow to heal.  Pain Assessment Currently in Pain?: No/denies  Precautions/Restrictions     Exercise/Treatments Mobility/Balance  Transfers Transfers: Sit to Stand;Stand Pivot Transfers Sit to Stand: 5: Supervision;From elevated surface Sit to Stand Details (indicate cue type and reason): Repeated 3x for tolerance Stand Pivot Transfers: 4: Min assist Stand Pivot Transfer Details (indicate cue type and reason): from W/C to bed w/cueing to push through legs and use arms Ambulation/Gait Ambulation Distance (Feet): 18 Feet (Hopping pattern) Assistive device: Rolling walker (min A)    Wound Therapy RLE wounds listed from most lateral to medial  1) 100% granulation, % yellow slough; no depth - dressed with honey gel, gauze and medipore 2) 100% granulation, Narrow open area, no depth - dressed with honeygel, gauze and medipore 3) 40% granulation 60% slough, increased depth, removed approximatly 3 inches of a loose stitch. _dressed with silver dressing, gauze and medipore 4) 75% granulation 25% slough - dressed with silver dression, gauze and medipore  Non-Selective Debridment: PLSV to all 4 locations Selective Debridement: Forceps, Scapel and scissors Location: To Wounds #3 and #4 to remove slough and loose stitch     Physical Therapy Assessment and Plan PT Assessment and Plan Clinical  Impression Statement: Pt wound bed had greater ease with debridment after PSLV was completed, continues to have signficant slough after selective debridment to wound 3 with increased depth.  Also has notable increased depth to a pin size hole to wound 4.  Reapplied shrinker and educated family to remove shrinker 1x/day to observe skin changes.  Changed wound dressing to silver to wound 3 and 4 secondary to increased necrotic tissue.  At end of session pt completed her STS and ambulated 18 ft with min guard and rw and had notable fatigue at end of session.  PT Plan: Pt will visit MD on 9/16.    Goals Home Exercise Program Pt/caregiver will Perform Home Exercise Program: For increased strengthening (Bed exercises) PT Goal: Perform Home Exercise Program - Progress: Met PT Short Term Goals Time to Complete Short Term Goals: 2 weeks PT Short Term Goal 1: Wound to be 80% granulated PT Short Term Goal 1 - Progress: Partly met PT Short Term Goal 2: Pt to be able to ambulate with RW x 50 ft. PT Short Term Goal 2 - Progress: Progressing toward goal PT Short Term Goal 3: Pt to be able to verbalize the importance of keeping her sugar under control in order to keep from becoming an AKA vs. BKA (discussed today recommendations from MD) PT Short Term Goal 3 - Progress: Met PT Long Term Goals Time to Complete Long Term Goals: 8 weeks PT Long Term Goal 1: Pt wound to be healed PT Long Term Goal 1 - Progress: Progressing toward goal PT Long Term Goal 2: Pt to be wearing her shrinker daily PT Long Term Goal 2 - Progress: Met Long Term Goal 3: Pt to  be able to don and doff prothesis Long Term Goal 4: Pt to be able to walk I with prothesis with least assistive devise x 300 ft  PT Long Term Goal 5: Pt to verbalize the importance of inspecting Rt stump and Lt feet on a daily basis. Additional PT Long Term Goals?: Yes PT Long Term Goal 6: Pt to be able to step up and off a curb I with prothesis and least assistive  device.  Problem List Patient Active Problem List   Diagnosis Date Noted  . Unstable balance 01/16/2013  . Difficulty in walking(719.7) 01/16/2013  . Infected wound 01/16/2013  . Hx of right BKA 12/23/2012  . Candidiasis of breast 12/08/2012  . S/P bilateral BKA (below knee amputation) 11/30/2012  . Diabetes 11/30/2012  . Diabetic foot ulcer 10/08/2012  . UTI (lower urinary tract infection) 10/07/2012  . Bacteremia due to Staphylococcus aureus 10/07/2012  . Anxiety 02/17/2012  . Osteomyelitis of right foot 02/14/2012  . Hematuria, microscopic 02/13/2012  . Hyponatremia 02/13/2012  . PVD (peripheral vascular disease) 02/08/2012  . Open wound of heel 02/08/2012  . Hyperlipidemia 11/11/2011  . Hypertension 11/11/2011  . Morbid obesity 11/11/2011  . Tachycardia 08/04/2011  . Acute exacerbation of CHF (congestive heart failure) 07/25/2011  . Diabetes mellitus with neuropathy 07/25/2011  . OSA (obstructive sleep apnea) 07/25/2011  . Acute bronchitis 07/25/2011  . Generalized weakness 07/25/2011  . Acute respiratory failure 07/25/2011  . Restless leg syndrome 07/25/2011  . Anemia 07/25/2011    PT Plan of Care PT Patient Instructions: remove shrinker 1x/day and encouraged ambulation Consulted and Agree with Plan of Care: Patient;Family member/caregiver  GP    Adriene Padula 02/09/2013, 12:37 PM

## 2013-02-12 ENCOUNTER — Ambulatory Visit (HOSPITAL_COMMUNITY)
Admission: RE | Admit: 2013-02-12 | Discharge: 2013-02-12 | Disposition: A | Discharge status: Home / Self Care | Payer: Medicare Other | Source: Ambulatory Visit | Attending: Internal Medicine | Admitting: Internal Medicine

## 2013-02-12 NOTE — Progress Notes (Signed)
Physical Therapy Re-evaluation  Patient Details  Name: Mary [name] MRN: [id number] Date of Birth: 06/19/1945  Today's Date: 02/12/2013 Time: 0810-0930 PT Time Calculation (min): 80 min              Visit#: 12 of 24  Re-eval: 03/12/13 Diagnosis: R BKA Surgical Date: 11/25/12 Authorization: [employer]    Authorization Visit#: 12 of 20  Charges:  There activity 12' (810-822), there ex 24' (823-847), MMT X 1 [id number]), deb <20cm (900-925)  Subjective Pt states she's keeping her shrinker on .  States she can tell a difference in the size of it.  Pt reports she feels weak from not being able to get up and walk, however states she is trying to do more at home and doing her exercises.   Precautions/Restrictions  Precautions Precautions: Fall   Objective: RLE Strength Right Hip Flexion: 5/5 (was 5/5) Right Hip Extension: 5/5 (was 4/5) Right Hip ABduction: 5/5 (was 3+/5) Right Hip ADduction: 5/5 (was 3+/5) Right Knee Flexion: 5/5 (was 4/5) Right Knee Extension: 5/5 (was 5/5)  LLE Strength Left Hip Flexion: 5/5 (was 4/5) Left Hip Extension: 5/5 (was 4/5) Left Hip ABduction: 4-/5 (was 3+/5) Left Hip ADduction: 4/5 (was 3+/5) Left Knee Flexion: 5/5 (was 4/5) Left Knee Extension: 5/5 (was 5/5)  Exercise/Treatments Mobility/Balance  Transfers Transfers: Sit to Stand;Stand Pivot Transfers Sit to Stand: 5: Supervision;From elevated surface  Ambulation:  Max of 47' with RW and mod assistance, hopping due to no prosthesis currently.  Standing Other Standing Knee Exercises: Transfer training SBA Seated Other Seated Knee Exercises: Sit to stand with UE assist to RW 5X; 5' max standing tolerance Supine Short Arc Quad Sets: 2 sets;10 reps;Limitations Short Arc Quad Sets Limitations: 5" holds Bilaterally 4# weight Bridges: 2 sets;10 reps;Limitations Bridges Limitations: BLE on physioball Straight Leg Raises: 2 sets;10 reps;Limitations Straight Leg Raises  Limitations: 0# Lt, 4# Rt Sidelying Hip ABduction: 2 sets;10 reps;Both;Left;Limitations Hip ABduction Limitations: 4# weight on Rt LE Hip ADduction: 2 sets;10 reps;Both;Limitations Hip ADduction Limitations: 4# weight on Rt LE  Wound care  RLE wounds listed from most lateral to medial  1) 90% granulation, 10% yellow slough 1.5 cm X 0.5 cmX 0 cm ( was 5% granulated, 95% slough 5 cm X 1 cm X ? Depth)  2) 100% granulation, no drainage appears to be healed Narrow open area (Not assessed on initial evaluation)  3) 80% yellow slough 20% granulation  1.5 cm X 1 cm X 0.7 cm (was 100% slough, 2 cm X 3 cm X unknown depth) 4) 60%% yellow slough 40% granulation 0.8 cm X 0.8 cm X 0.3 cm (was 100% slough, 1.2cm X 1.2 cm X unknown depth)  All wounds irrigated with pulse lavage, debrided with forceps and dressed with acticoat, silver dressing, 2X2 and medipore tape.  Shrinker placed over residual limb following dressings.  Physical Therapy Assessment and Plan PT Assessment and Plan Pt Assessment: Pt has been seen 12 times by PT for woundcare and functional mobility training.  Pt has met 2/3 STG's/ 2/6 LTG's and  progressing well toward others.  Unable to progress towards prosthesis goals due to has not yet received it due to unhealed wounds.  Overall, wounds have decreased in size and depth, responding well to current treatment and dressings with silver acticoat.  Rt LE strength is now WNL and Lt LE has increased 1/2 to 1 grade in strength. Pt now requires less assistance with transfers and mobility.   PT Plan: Pt will  visit MD on 9/16. Continue with wound care and LE strengthening to reach functional goals.     Goals Home Exercise Program Pt/caregiver will Perform Home Exercise Program: For increased strengthening (Bed exercises) PT Goal: Perform Home Exercise Program - Progress: Progressing toward goal  PT Short Term Goals Time to Complete Short Term Goals: 2 weeks PT Short Term Goal 1: Wound to be 80%  granulated - Progress: Met PT Short Term Goal 2: Pt to be able to ambulate with RW x 50 ft.- Progress: Progressing toward goal PT Short Term Goal 3: Pt to be able to verbalize the importance of keeping her sugar under control in order to keep from becoming an AKA vs. BKA (discussed today recommendations from MD) - Progress: Met  PT Long Term Goals Time to Complete Long Term Goals: 8 weeks PT Long Term Goal 1: Pt wound to be healed- Progress: Progressing toward goal PT Long Term Goal 2: Pt to be wearing her shrinker daily - Progress: Met Long Term Goal 3: Pt to be able to don and doff prothesis- Progress: Not met Long Term Goal 4: Pt to be able to walk I with prothesis with least assistive devise x 300 ft - Progress: Not met PT Long Term Goal 5: Pt to verbalize the importance of inspecting Rt stump and Lt feet on a daily basis.-Progress: Met PT Long Term Goal 6: Pt to be able to step up and off a curb I with prothesis and least assistive device.-Progress:  Not Met   Patient Active Problem List   Diagnosis Date Noted  . Unstable balance 01/16/2013  . Difficulty in walking(719.7) 01/16/2013  . Infected wound 01/16/2013  . Hx of right BKA 12/23/2012  . Candidiasis of breast 12/08/2012  . S/P bilateral BKA (below knee amputation) 11/30/2012  . Diabetes 11/30/2012  . Diabetic foot ulcer 10/08/2012  . UTI (lower urinary tract infection) 10/07/2012  . Bacteremia due to Staphylococcus aureus 10/07/2012  . Anxiety 02/17/2012  . Osteomyelitis of right foot 02/14/2012  . Hematuria, microscopic 02/13/2012  . Hyponatremia 02/13/2012  . PVD (peripheral vascular disease) 02/08/2012  . Open wound of heel 02/08/2012  . Hyperlipidemia 11/11/2011  . Hypertension 11/11/2011  . Morbid obesity 11/11/2011  . Tachycardia 08/04/2011  . Acute exacerbation of CHF (congestive heart failure) 07/25/2011  . Diabetes mellitus with neuropathy 07/25/2011  . OSA (obstructive sleep apnea) 07/25/2011  . Acute  bronchitis 07/25/2011  . Generalized weakness 07/25/2011  . Acute respiratory failure 07/25/2011  . Restless leg syndrome 07/25/2011  . Anemia 07/25/2011    PT - End of Session Equipment Utilized During Treatment: Gait belt PT Plan of Care PT Patient Instructions: remove shrinker 1x/day and encouraged ambulation Consulted and Agree with Plan of Care: Patient;Family member/caregiver   Teena Irani, PTA/CLT 02/12/2013, 10:13 AM

## 2013-02-14 ENCOUNTER — Ambulatory Visit (HOSPITAL_COMMUNITY)
Admission: RE | Admit: 2013-02-14 | Discharge: 2013-02-14 | Disposition: A | Discharge status: Home / Self Care | Payer: Medicare Other | Source: Ambulatory Visit | Attending: Internal Medicine | Admitting: Internal Medicine

## 2013-02-14 DIAGNOSIS — R2689 Other abnormalities of gait and mobility: Secondary | ICD-10-CM

## 2013-02-14 DIAGNOSIS — R262 Difficulty in walking, not elsewhere classified: Secondary | ICD-10-CM

## 2013-02-14 NOTE — Progress Notes (Signed)
Physical Therapy Treatment Patient Details  Name: Mary [name] MRN: [id number] Date of Birth: 1945/07/22  Today's Date: 02/14/2013 Time: 0800-0925 PT Time Calculation (min): 53 min Charge: Deb < 20 cm [id number], TE [id number], Self care [id number]  Visit#: 13 of    Re-eval: 03/12/13 Assessment Diagnosis: R BKA Surgical Date: 11/25/12  Authorization: [employer]  Authorization Time Period:    Authorization Visit#: 13 of 20   Subjective: Symptoms/Limitations Symptoms: Doctor apt yesterday, pt reports MD happy with progress.  Pain free today. Pain Assessment Currently in Pain?: No/denies  Precautions/Restrictions  Precautions Precautions: Fall  Exercise/Treatments Mobility/Balance  Transfers Transfers: Sit to [employer] Sit to Stand: 5: Supervision;From elevated surface Stand Pivot Transfers: 4: Min Psychologist, occupational Details (indicate cue type and reason): from W/C to bed w/cueing to push through legs and use arms Ambulation/Gait Ambulation/Gait: Yes Ambulation/Gait Assistance: 3: Mod assist Ambulation Distance (Feet): 12 Feet Assistive device: Rolling walker     Standing Other Standing Knee Exercises: Transfer training SBA Seated Other Seated Knee Exercises: 2# 20 reps all exercise: Bil UE bicep curls, abduction, tricept Other Seated Knee Exercises: Sit to stand with UE assist to RW 5x Supine Short Arc Quad Sets: 15 reps Short Arc Quad Sets Limitations: 5" holds with 4# Bil LE Bridges: 15 reps;Limitations Bridges Limitations: BLE on physioball Straight Leg Raises: 20 reps;Limitations Straight Leg Raises Limitations: 0# Lt, 4# Rt Sidelying Hip ABduction: 15 reps;Both;Limitations Hip ABduction Limitations: 4# weight on Rt LE Hip ADduction: 15 reps;Limitations Hip ADduction Limitations: 4# weight on Rt LE  Wound care  RLE wounds listed from most lateral to medial  1) 90% granulation, 10% yellow slough  2) 100%  granulation, no drainage appears to be healed Narrow open area 3) 80% yellow slough 20% granulation  4) 60%% yellow slough 40% granulation Debridement with forceps and dressed with xeroform for wounds 1) & 2) and acticoat silver dressing for wound 3) & 4), 2X2 and medipore tape. Shrinker placed over residual limb following dressings.     Physical Therapy Assessment and Plan PT Assessment and Plan Clinical Impression Statement: Wounds progressing well towards healing.  Increased granulation noted following debridement with anticoat to wound # 3&4 and xeroform to #1 & 2 with meditape and shinker following dressings.  Open chain kinetic exercises complete for LE strengthening, added UE strengthening exercises to increase ease with transfer training and gait.  Mod assistance required with transfer and gait training with cueing for handplacement for safety and to increase ease with activity.  Pt with low sugar, pt given orange juice and a snack to bring sugar level up.  Pt educated on importance of maintaining appropriate sugar levels for overall health and to protect her leg.   PT Plan: Continue with wound care and LE strengthening to reach functional goals.     Goals Home Exercise Program Pt/caregiver will Perform Home Exercise Program: For increased strengthening (Bed exercises) PT Short Term Goals Time to Complete Short Term Goals: 2 weeks PT Short Term Goal 1: Wound to be 80% granulated PT Short Term Goal 2: Pt to be able to ambulate with RW x 50 ft. PT Short Term Goal 2 - Progress: Progressing toward goal PT Short Term Goal 3: Pt to be able to verbalize the importance of keeping her sugar under control in order to keep from becoming an AKA vs. BKA (discussed today recommendations from MD) PT Short Term Goal 3 - Progress: Progressing toward goal PT Long Term Goals Time to  Complete Long Term Goals: 8 weeks PT Long Term Goal 1: Pt wound to be healed PT Long Term Goal 2: Pt to be wearing her  shrinker daily Long Term Goal 3: Pt to be able to don and doff prothesis Long Term Goal 4: Pt to be able to walk I with prothesis with least assistive devise x 300 ft  PT Long Term Goal 5: Pt to verbalize the importance of inspecting Rt stump and Lt feet on a daily basis. Additional PT Long Term Goals?: Yes PT Long Term Goal 6: Pt to be able to step up and off a curb I with prothesis and least assistive device.  Problem List Patient Active Problem List   Diagnosis Date Noted  . Unstable balance 01/16/2013  . Difficulty in walking(719.7) 01/16/2013  . Infected wound 01/16/2013  . Hx of right BKA 12/23/2012  . Candidiasis of breast 12/08/2012  . S/P bilateral BKA (below knee amputation) 11/30/2012  . Diabetes 11/30/2012  . Diabetic foot ulcer 10/08/2012  . UTI (lower urinary tract infection) 10/07/2012  . Bacteremia due to Staphylococcus aureus 10/07/2012  . Anxiety 02/17/2012  . Osteomyelitis of right foot 02/14/2012  . Hematuria, microscopic 02/13/2012  . Hyponatremia 02/13/2012  . PVD (peripheral vascular disease) 02/08/2012  . Open wound of heel 02/08/2012  . Hyperlipidemia 11/11/2011  . Hypertension 11/11/2011  . Morbid obesity 11/11/2011  . Tachycardia 08/04/2011  . Acute exacerbation of CHF (congestive heart failure) 07/25/2011  . Diabetes mellitus with neuropathy 07/25/2011  . OSA (obstructive sleep apnea) 07/25/2011  . Acute bronchitis 07/25/2011  . Generalized weakness 07/25/2011  . Acute respiratory failure 07/25/2011  . Restless leg syndrome 07/25/2011  . Anemia 07/25/2011    PT - End of Session Equipment Utilized During Treatment: Gait belt Activity Tolerance: Patient tolerated treatment well General Behavior During Therapy: [facility] for tasks assessed/performed  GP    Aldona Lento 02/14/2013, 10:17 AM

## 2013-02-16 ENCOUNTER — Ambulatory Visit (HOSPITAL_COMMUNITY)
Admission: RE | Admit: 2013-02-16 | Discharge: 2013-02-16 | Disposition: A | Discharge status: Home / Self Care | Payer: Medicare Other | Source: Ambulatory Visit | Attending: Internal Medicine | Admitting: Internal Medicine

## 2013-02-16 DIAGNOSIS — R262 Difficulty in walking, not elsewhere classified: Secondary | ICD-10-CM

## 2013-02-16 DIAGNOSIS — R2689 Other abnormalities of gait and mobility: Secondary | ICD-10-CM

## 2013-02-16 NOTE — Progress Notes (Signed)
Physical Therapy Treatment Patient Details  Name: Mary [name] MRN: [id number] Date of Birth: 1946/01/05  Today's Date: 02/16/2013 Time: [id number] PT Time Calculation (min): 46 min Charge: Self care [phone], Wound care selective debridement <20 cm 0945-1002, TA 1002-1012, TE 1013-1023  Visit#: 14 of 24  Re-eval: 03/12/13 Assessment Diagnosis: R BKA Surgical Date: 11/25/12 Next MD Visit: Prostetic specialist in 2 weekx   Authorization: [employer]  Authorization Time Period:    Authorization Visit#: 14 of 20   Subjective: Symptoms/Limitations Symptoms: Pt reported eating early and taking insulin immediately after, stated sugar level at 109 this morning and thinks she took a little much insulin this morning.  Requested orange juice and chocolate initial this session.   Pain Assessment Currently in Pain?: No/denies  Precautions/Restrictions  Precautions Precautions: Fall  Exercise/Treatments Aerobic Stationary Bike: Nustep 10 minutes hill level 3 resistance level 2 SPM > 80 Standing Other Standing Knee Exercises: Transfer training SBA    Wound care  RLE wounds listed from most lateral to medial  1) 90% granulation, 10% yellow slough  2) 100% granulation, no drainage appears to be healed Narrow open area  3) 80% yellow slough 20% granulation  4) 60%% yellow slough 40% granulation  Debridement with forceps and dressed with xeroform for wounds 1) & 2) and acticoat silver dressing for wound 3) & 4), 2X2 and medipore tape. Shrinker placed over residual limb following dressings.  Physical Therapy Assessment and Plan PT Assessment and Plan Clinical Impression Statement: Pt educated on importance of following appropriate grid for sugar level and insulin intake.  Wound progressing well, positive results with the silver dressings to wound #3&4 with increase ease of debridement of yellow slough.  Improved granulation with wound #1&2 with minimal debridement  required.  Min guard to supervision this sesion with transfers.  Ended session on Nustep for strengthening, activity tolerance and to improve coordination with UE/LE.   PT Plan: Continue with wound care and LE strengthening to reach functional goals.     Goals Home Exercise Program Pt/caregiver will Perform Home Exercise Program: For increased strengthening (Bed exercises) PT Short Term Goals Time to Complete Short Term Goals: 2 weeks PT Short Term Goal 1: Wound to be 80% granulated PT Short Term Goal 1 - Progress: Met PT Short Term Goal 2: Pt to be able to ambulate with RW x 50 ft. PT Short Term Goal 2 - Progress: Progressing toward goal PT Short Term Goal 3: Pt to be able to verbalize the importance of keeping her sugar under control in order to keep from becoming an AKA vs. BKA (discussed today recommendations from MD) PT Short Term Goal 3 - Progress: Progressing toward goal PT Long Term Goals Time to Complete Long Term Goals: 8 weeks PT Long Term Goal 1: Pt wound to be healed PT Long Term Goal 2: Pt to be wearing her shrinker daily PT Long Term Goal 2 - Progress: Met Long Term Goal 3: Pt to be able to don and doff prothesis Long Term Goal 3 Progress: Not met Long Term Goal 4: Pt to be able to walk I with prothesis with least assistive devise x 300 ft  Long Term Goal 4 Progress: Not met PT Long Term Goal 5: Pt to verbalize the importance of inspecting Rt stump and Lt feet on a daily basis. Long Term Goal 5 Progress: Met Additional PT Long Term Goals?: Yes PT Long Term Goal 6: Pt to be able to step up and off a curb I  with prothesis and least assistive device.  Problem List Patient Active Problem List   Diagnosis Date Noted  . Unstable balance 01/16/2013  . Difficulty in walking(719.7) 01/16/2013  . Infected wound 01/16/2013  . Hx of right BKA 12/23/2012  . Candidiasis of breast 12/08/2012  . S/P bilateral BKA (below knee amputation) 11/30/2012  . Diabetes 11/30/2012  .  Diabetic foot ulcer 10/08/2012  . UTI (lower urinary tract infection) 10/07/2012  . Bacteremia due to Staphylococcus aureus 10/07/2012  . Anxiety 02/17/2012  . Osteomyelitis of right foot 02/14/2012  . Hematuria, microscopic 02/13/2012  . Hyponatremia 02/13/2012  . PVD (peripheral vascular disease) 02/08/2012  . Open wound of heel 02/08/2012  . Hyperlipidemia 11/11/2011  . Hypertension 11/11/2011  . Morbid obesity 11/11/2011  . Tachycardia 08/04/2011  . Acute exacerbation of CHF (congestive heart failure) 07/25/2011  . Diabetes mellitus with neuropathy 07/25/2011  . OSA (obstructive sleep apnea) 07/25/2011  . Acute bronchitis 07/25/2011  . Generalized weakness 07/25/2011  . Acute respiratory failure 07/25/2011  . Restless leg syndrome 07/25/2011  . Anemia 07/25/2011    PT - End of Session Equipment Utilized During Treatment: Gait belt Activity Tolerance: Patient tolerated treatment well General Behavior During Therapy: [facility] for tasks assessed/performed  GP    Aldona Lento 02/16/2013, 1:30 PM

## 2013-02-19 ENCOUNTER — Ambulatory Visit (HOSPITAL_COMMUNITY)
Admission: RE | Admit: 2013-02-19 | Discharge: 2013-02-19 | Disposition: A | Discharge status: Home / Self Care | Payer: Medicare Other | Source: Ambulatory Visit | Attending: Internal Medicine | Admitting: Internal Medicine

## 2013-02-19 NOTE — Progress Notes (Signed)
Physical Therapy Treatment Patient Details  Name: Mary [name] MRN: [id number] Date of Birth: 10/20/1945  Today's Date: 02/19/2013 Time: [id number] PT Time Calculation (min): 75 min Charges: Selective debridement (= or < 20 cm) (1305-1335) Therex x 23'(1340-1303)  NMR x [id number])   Visit#: 15 of 24  Re-eval: 03/12/13  Authorization: [employer]  Authorization Visit#: 15 of 20   Subjective: Symptoms/Limitations Symptoms: Pt states that she is standing more at home. Pain Assessment Currently in Pain?: No/denies   Exercise/Treatments Standing Other Standing Knee Exercises: Standing x 5'; Standing raising one arm at a time from walker Other Standing Knee Exercises: Hopping with walker x 14 feet Supine Straight Leg Raises: 20 reps;Limitations Straight Leg Raises Limitations: 0# Lt, 4# Rt Other Supine Knee Exercises: Crunches: 10x straight 10x diagonal Sidelying Hip ABduction: 15 reps;Both;Limitations Hip ABduction Limitations: 4# weight on Rt LE Prone  Hip Extension: 10 reps;Limitations Hip Extension Limitations: 0# on L 4# on right  Wound care  RLE wounds listed from most lateral to medial  1) 90% granulation, 10% yellow slough  2) 100% granulation, no drainage appears to be healed Narrow open area  3) 80% yellow slough 20% granulation  4) 60%% yellow slough 40% granulation   Debridement completed with forceps Xeroform applied to wounds 1 & 2; Acticoat silver dressing for wounds 3 & 4. Wounds covered with  2X2/ABD and anchored medipore tape. Shrinker placed over residual limb following dressings.  Physical Therapy Assessment and Plan PT Assessment and Plan Clinical Impression Statement: Wounds continue to progress well. Able to remove significan amount of slough from medial wounds. Pt also displasy improved activity tolerance with standing. Pt able to hop with walker for 14' without stopping. Pt has difficulty standing wihtou BHHA assit. Overall pt  is porgressing well toward goals. PT Plan: Continue with wound care and LE strengthening to reach functional goals.      Problem List Patient Active Problem List   Diagnosis Date Noted  . Unstable balance 01/16/2013  . Difficulty in walking(719.7) 01/16/2013  . Infected wound 01/16/2013  . Hx of right BKA 12/23/2012  . Candidiasis of breast 12/08/2012  . S/P bilateral BKA (below knee amputation) 11/30/2012  . Diabetes 11/30/2012  . Diabetic foot ulcer 10/08/2012  . UTI (lower urinary tract infection) 10/07/2012  . Bacteremia due to Staphylococcus aureus 10/07/2012  . Anxiety 02/17/2012  . Osteomyelitis of right foot 02/14/2012  . Hematuria, microscopic 02/13/2012  . Hyponatremia 02/13/2012  . PVD (peripheral vascular disease) 02/08/2012  . Open wound of heel 02/08/2012  . Hyperlipidemia 11/11/2011  . Hypertension 11/11/2011  . Morbid obesity 11/11/2011  . Tachycardia 08/04/2011  . Acute exacerbation of CHF (congestive heart failure) 07/25/2011  . Diabetes mellitus with neuropathy 07/25/2011  . OSA (obstructive sleep apnea) 07/25/2011  . Acute bronchitis 07/25/2011  . Generalized weakness 07/25/2011  . Acute respiratory failure 07/25/2011  . Restless leg syndrome 07/25/2011  . Anemia 07/25/2011    PT - End of Session Equipment Utilized During Treatment: Gait belt Activity Tolerance: Patient tolerated treatment well General Behavior During Therapy: [facility] for tasks assessed/performed  Rachelle Hora, PTA 02/19/2013, 5:18 PM

## 2013-02-21 ENCOUNTER — Ambulatory Visit (HOSPITAL_COMMUNITY)
Admission: RE | Admit: 2013-02-21 | Discharge: 2013-02-21 | Disposition: A | Discharge status: Home / Self Care | Payer: Medicare Other | Source: Ambulatory Visit | Attending: Internal Medicine | Admitting: Internal Medicine

## 2013-02-21 DIAGNOSIS — R2689 Other abnormalities of gait and mobility: Secondary | ICD-10-CM

## 2013-02-21 DIAGNOSIS — R262 Difficulty in walking, not elsewhere classified: Secondary | ICD-10-CM

## 2013-02-21 DIAGNOSIS — T798XXS Other early complications of trauma, sequela: Secondary | ICD-10-CM

## 2013-02-21 NOTE — Progress Notes (Signed)
Physical Therapy Treatment Patient Details  Name: Mary [name] MRN: [id number] Date of Birth: [birth year]  Today's Date: 02/21/2013 Time: 0852-0942 PT Time Calculation (min): 50 min Charges: Deb < 20cm Gait: 915-930 Visit#: 16 of 24  Re-eval: 03/12/13    Authorization: [employer]  Authorization Time Period:    Authorization Visit#: 16 of 20   Subjective: Symptoms/Limitations Symptoms: "When I start walking, I want to be able to go back to work." reports some days she has greater ease with standing, was very tired after last session.  Pain Assessment Currently in Pain?: No/denies  Precautions/Restrictions     Exercise/Treatments Aerobic Stationary Bike: Nustep 10 minutes hill level 2 resistance level 3 SPM avg: for LE strengthening Standing Other Standing Knee Exercises: Hopping with walker x 53 feet w/min A  RLE wounds listed from most lateral to medial  1)  0.6 cm width x 0.9 length x 0.5 depth 85% slough, 15% granulation (was 1.2 cm diameter unstagable)  85% yellow slough 15% granulation) 2) 0.7 cm width x 1.0 length x0.7 cm depth 90% slough, 10% granulation (was 2x3cm depth Unknown , 100% slough) 3) Healed 4)Healed      Physical Therapy Assessment and Plan PT Assessment and Plan Clinical Impression Statement: Wounds 3 adn 4 are 100% healed.  Wounds 1 and 2 continues to have significant adherent slought.  Continued dressing with silver dressings to decrease slough.  Pt hopped with min A and max encouragment for 52 feet x15 minutes to complete with all standing rest breaks.  Enocuraged to ambulate 3-4x thorughout the day short distances and decrease use of her scooter.  PT Plan: Continue with wound care and LE strengthening to reach functional goals.     Goals Home Exercise Program Pt/caregiver will Perform Home Exercise Program: For increased strengthening (Bed exercises) PT Goal: Perform Home Exercise Program - Progress: Met PT Short Term  Goals Time to Complete Short Term Goals: 2 weeks PT Short Term Goal 1: Wound to be 80% granulated PT Short Term Goal 1 - Progress: Progressing toward goal PT Short Term Goal 2: Pt to be able to ambulate with RW x 50 ft. PT Short Term Goal 2 - Progress: Met PT Short Term Goal 3: Pt to be able to verbalize the importance of keeping her sugar under control in order to keep from becoming an AKA vs. BKA (discussed today recommendations from MD) PT Short Term Goal 3 - Progress: Progressing toward goal PT Long Term Goals Time to Complete Long Term Goals: 8 weeks PT Long Term Goal 1: Pt wound to be healed PT Long Term Goal 1 - Progress: Progressing toward goal PT Long Term Goal 2: Pt to be wearing her shrinker daily PT Long Term Goal 2 - Progress: Met Long Term Goal 3: Pt to be able to don and doff prothesis Long Term Goal 3 Progress: Not met Long Term Goal 4: Pt to be able to walk I with prothesis with least assistive devise x 300 ft  Long Term Goal 4 Progress: Not met PT Long Term Goal 5: Pt to verbalize the importance of inspecting Rt stump and Lt feet on a daily basis. Long Term Goal 5 Progress: Met Additional PT Long Term Goals?: Yes PT Long Term Goal 6: Pt to be able to step up and off a curb I with prothesis and least assistive device. Long Term Goal 6 Progress: Not met  Problem List Patient Active Problem List   Diagnosis Date Noted  . Unstable balance  01/16/2013  . Difficulty in walking(719.7) 01/16/2013  . Infected wound 01/16/2013  . Hx of right BKA 12/23/2012  . Candidiasis of breast 12/08/2012  . S/P bilateral BKA (below knee amputation) 11/30/2012  . Diabetes 11/30/2012  . Diabetic foot ulcer 10/08/2012  . UTI (lower urinary tract infection) 10/07/2012  . Bacteremia due to Staphylococcus aureus 10/07/2012  . Anxiety 02/17/2012  . Osteomyelitis of right foot 02/14/2012  . Hematuria, microscopic 02/13/2012  . Hyponatremia 02/13/2012  . PVD (peripheral vascular disease)  02/08/2012  . Open wound of heel 02/08/2012  . Hyperlipidemia 11/11/2011  . Hypertension 11/11/2011  . Morbid obesity 11/11/2011  . Tachycardia 08/04/2011  . Acute exacerbation of CHF (congestive heart failure) 07/25/2011  . Diabetes mellitus with neuropathy 07/25/2011  . OSA (obstructive sleep apnea) 07/25/2011  . Acute bronchitis 07/25/2011  . Generalized weakness 07/25/2011  . Acute respiratory failure 07/25/2011  . Restless leg syndrome 07/25/2011  . Anemia 07/25/2011    PT - End of Session Equipment Utilized During Treatment: Gait belt Activity Tolerance: Patient tolerated treatment well General Behavior During Therapy: [facility] for tasks assessed/performed  GP    Modestine Scherzinger, MPT, ATC 02/21/2013, 10:37 AM

## 2013-02-22 ENCOUNTER — Encounter: Payer: Self-pay | Admitting: Internal Medicine

## 2013-02-22 NOTE — Progress Notes (Signed)
Patient ID: Mary [name], female   DOB: 11-21-1945, 67 y.o.   MRN: [id number] Chief complaint; admission to SNF. [place]  history; this patient apparently has had a wound on her right heel over the last year. She was in the hospital. At any [facility] in May and apparently with sepsis. She followed with infectious disease. She was followed at the wound care center in [place] and underwent hyperbaric oxygen for a period of time. Nevertheless in spite of all of this the wound deteriorated, she developed underlying osteomyelitis and required a right below knee amputation.. this was performed by Dr.Duda. The patient is a type II diabetic on insulin for the last 8 years. She tells me her hemoglobin A1c was 8 when last checked. She is diabetic neuropathy, but apparently her vascular supply is adequate. She already has had diabetic hypoglycemia since arriving in the facility. Today, at 11:30 CBG dropped to 47. She required orange juice, glucagon, and Glucerna.   Past medical history #1 type 2 diabetes with neuropathy. #2, now status post right below knee amputation for osteomyelitis of the right calcaneus discharged on Levaquin. #3 apparently severe restless leg syndrome. #4 question peripheral vascular disease on Pletal. #5 anemia on ferrous fumarate 5 tablets daily. #6 hypertension   Medication Xanax 1 mg by mouth every 6 hours when necessary, tramadol 50 mg by mouth 4 times a day, when necessary pain, trazodone, 50 mg by mouth each bedtime when necessary insomnia, Humalog 50-50 40 units in the morning, 20 units at lunch, and 30 units in the evening, vitamin D thousand units twice a day, Pletal 100 mg daily, cyclobenzaprine 5 mg by mouth twice a day, ferrous sulfate 5 minute, 5 tablets by mouth daily, Levaquin 500 by mouth daily, metformin thousand by mouth twice a day, metoprolol 50 by mouth twice a day, Requip 2 mg by mouth every afternoon, Mirapex, 0.5 by mouth with breakfast and lunch, and 2 by  mouth with supper.   Socially ; patient lives in [place] with her husband. Previously independent nonsmoker.   Review of system;   Respiratory no shortness of breath or cough. Cardiac no chest pain. Abdomen no abdominal pain, or diarrhea. Extremities she describes severe restless leg syndrome during the day and at night. Incisional pain is mild at this point.   Physical exam patient is in no distress. Respiratory clear entry bilaterally. Cardiac heart sounds are normal. No murmurs or bruits. Abdomen morbidly obese without mass or tenderness. Extremities right below knee amputation site is approximated. Scant amount of sanguinous discharge from one small area. The distal part of the amputation site has some erythema, but no significant tenderness or warmth. Vascular popliteal pulses are intact bilaterally.   Impression/plan #1 status post right below-knee amputation for osteomyelitis. I believe she is on Levaquin possibly for concern of a stump infection. I will continue this until I am able to see this. Next week. #2 type 2 diabetes with diabetic neuropathy, on insulin. She has already had a significant hypoglycemic spell. She will require adjustments of her insulin. She is not on aspirin. Would wonder about her lipid status. #3 severe restless leg syndrome, which seems in addition to the diabetic neuropathy. She has Anemia. We'll check her hemoglobin          This encounter was created in error - please disregard.

## 2013-02-22 NOTE — Progress Notes (Signed)
Patient ID: Mary [name], female   DOB: Jul 21, 1945, 67 y.o.   MRN: [id number] This is a discharge note.  Facility [facility].  Level of care skilled.  Date is 01/03/2013.  Complaint discharge note    History of present illness;   this patient apparently has had a wound on her right heel over the last year. She was in the hospital.  in May and apparently with sepsis. She followed with infectious disease. She was followed at the wound care center in [place] and underwent hyperbaric oxygen for a period of time. Nevertheless in spite of all of this the wound deteriorated, she developed underlying osteomyelitis and required a right below knee amputation.. this was performed by Dr.Duda  Her postop course has been relatively unremarkable here-when she first came to facility she stated have some episodes of hypoglycemia but this appears to be stable now she is on Humalog 50-50 30 units in the a.m. 10 at noon and 20 at supper her blood sugars generally run in the low to mid 100s.  Surgical site has healed fairly unremarkably this has been followed by surgery as well.  She does receive Percocet for pain she seldom takes this.  She will be going home with her husband who is very supportive.  She will need a wheelchair as well as continued therapy.  Vital signs continued to be stable she does have a history of hypertension recent blood pressures 118/73-125/80-she is on low pressure.  She also has a significant history of restless leg syndrome she is on Requip as well as Mirapex in this appears to help she does not complaining of that today.      Past medical history #1 type 2 diabetes with neuropathy. #2, now status post right below knee amputation for osteomyelitis of the right calcaneus. #3 apparently severe restless leg syndrome. #4 question peripheral vascular disease on Pletal. #5 anemia . #6 hypertension    . Medications have been reviewed per [facility]   Socially ; patient lives in  [place] with her husband. Previously independent nonsmoker.   Review of system Gen. no complaints of fever or chills.  Skin-she had concern at one point about some erythema around surgical site but this has resolved;   Respiratory no shortness of breath or cough. Cardiac no chest pain. Abdomen no abdominal pain, or diarrhea. Extremities-appears to be stable she does take Percocet rarely leg pain appears to be stable she also has restless legs but this apparently has been somewhat stable as well.  Neurologic does not complaining of any headaches or dizziness.  Psych appears to be in good spirits looking forward to going home I do not see any issues during her stay here.   Physical exam Temperature is 98.3 pulse 65 respirations 20 blood pressure 118/73  patient is in no distress Skin is warm and dry. Respiratory clear entry bilaterally. Cardiac heart sounds are normal. No murmurs or bruits. Abdomen morbidly obese without mass or tenderness bowel sounds are positive. Extremities right below knee amputation site is approximated--surgical site looks quite benign no sign of infection-appears to be healing unremarkably . Pedal pulses appear to be intact.  Neurologic-grossly intact no lateralizing findings her speech is clear.  Psych she is alert and oriented x3 pleasant and appropriate.  Labs.  12/04/2012.  WBC 8.4 hemoglobin 9.0 platelets 360.  Sodium 137 potassium 4.7 BUN 33 creatinine 1.40.  Marland Kitchen   Impression/plan #1 status post right below-knee amputation for osteomyelitis.--This has healed quite unremarkably-will have followup as needed  by surgery-also will need continued therapies she has done very well here . Marland Kitchen #2 type 2 diabetes with diabetic neuropathy, on insulin --Blood sugars have stabilized adjustments have been made to her insulin . #3 severe restless leg syndrome-this has not really been an issue during her stay here.  #4-peripheral vascular disease-she  does continue on Pletal nystatin stable.  #5-hypertension-this is stable as well on Lopressor.  #6-anemia-will update CBC before discharge --appears her hemoglobin in the hospital ran largely in the 9 area will need followup by primary care provider.  #7-renal insufficiency-will update BMP before discharge creatinine appears to run in the low ones(1-1.4) baseline per chart review -- apparently she is eating and drinking quite well.  [id number] note greater than 30 minutes spent preparing this discharge summary

## 2013-02-26 ENCOUNTER — Ambulatory Visit (HOSPITAL_COMMUNITY)
Admission: RE | Admit: 2013-02-26 | Discharge: 2013-02-26 | Disposition: A | Discharge status: Home / Self Care | Payer: Medicare Other | Source: Ambulatory Visit | Attending: Internal Medicine | Admitting: Internal Medicine

## 2013-02-26 NOTE — Progress Notes (Signed)
Physical Therapy Treatment Patient Details  Name: Mary [name] MRN: [id number] Date of Birth: 07-31-1945  Today's Date: 02/26/2013 Time: 1550-1650 PT Time Calculation (min): 60 min  Visit#: 17 of 24  Re-eval: 03/12/13 Authorization: [employer]  Authorization Visit#: 17 of 20 Charges: gait 1550-1615 (25'), deb<20cm [id number] (18'), therex [id number] (10')   Subjective: Symptoms/Limitations Symptoms: Pt states she went to prosthetist today and he made a mold of her residual limb.  States she is getting stronger.   Exercise/Treatments Aerobic Stationary Bike: Nustep 10 minutes seat 10 hill level 3 resistance level 4 SPM avg 80: for LE strengthening Standing Other Standing Knee Exercises: Hopping with walker x 55 feet X1, 30 feet X 1  w/min A    Woundcare: RLE wounds listed from most medial to lateral  1) 0.6 cm width x 0.9 length x 0.5 depth 85% slough, 15% granulation (was 1.2 cm diameter unstagable)  85% yellow slough 15% granulation)  2) 0.7 cm width x 1.0 length x0.7 cm depth 90% slough, 10% granulation (was 2x3cm depth Unknown , 100% slough)  3) Healed  4) Healed     Physical Therapy Assessment and Plan PT Assessment and Plan Clinical Impression Statement: Only 2 small wounds remain on medial side of residual limb.  All lateral wounds (#3/#4) are healed.  increasing granulation with decreasing slough.  Continued use of silver dressings.  Noted dry, callous distal residual limb.  Encouraged pt to use lotion on end of residual limb to decrease risk of wound/infection.  Pt able to ambulate 2 bouts today and increased resistance on nustep bike. PT Plan: Continue with wound care and LE strengthening to reach functional goals.       Problem List Patient Active Problem List   Diagnosis Date Noted  . Unstable balance 01/16/2013  . Difficulty in walking(719.7) 01/16/2013  . Infected wound 01/16/2013  . Hx of right BKA 12/23/2012  . Candidiasis of breast  12/08/2012  . S/P bilateral BKA (below knee amputation) 11/30/2012  . Diabetes 11/30/2012  . Diabetic foot ulcer 10/08/2012  . UTI (lower urinary tract infection) 10/07/2012  . Bacteremia due to Staphylococcus aureus 10/07/2012  . Anxiety 02/17/2012  . Osteomyelitis of right foot 02/14/2012  . Hematuria, microscopic 02/13/2012  . Hyponatremia 02/13/2012  . PVD (peripheral vascular disease) 02/08/2012  . Open wound of heel 02/08/2012  . Hyperlipidemia 11/11/2011  . Hypertension 11/11/2011  . Morbid obesity 11/11/2011  . Tachycardia 08/04/2011  . Acute exacerbation of CHF (congestive heart failure) 07/25/2011  . Diabetes mellitus with neuropathy 07/25/2011  . OSA (obstructive sleep apnea) 07/25/2011  . Acute bronchitis 07/25/2011  . Generalized weakness 07/25/2011  . Acute respiratory failure 07/25/2011  . Restless leg syndrome 07/25/2011  . Anemia 07/25/2011    PT - End of Session Equipment Utilized During Treatment: Gait belt Activity Tolerance: Patient tolerated treatment well General Behavior During Therapy: [facility] for tasks assessed/performed   Teena Irani, PTA/CLT 02/26/2013, 5:09 PM

## 2013-02-28 ENCOUNTER — Ambulatory Visit (HOSPITAL_COMMUNITY)
Admission: RE | Admit: 2013-02-28 | Discharge: 2013-02-28 | Disposition: A | Discharge status: Home / Self Care | Payer: Medicare Other | Source: Ambulatory Visit | Attending: Internal Medicine | Admitting: Internal Medicine

## 2013-02-28 DIAGNOSIS — IMO0001 Reserved for inherently not codable concepts without codable children: Secondary | ICD-10-CM | POA: Insufficient documentation

## 2013-02-28 DIAGNOSIS — Y849 Medical procedure, unspecified as the cause of abnormal reaction of the patient, or of later complication, without mention of misadventure at the time of the procedure: Secondary | ICD-10-CM | POA: Diagnosis not present

## 2013-02-28 DIAGNOSIS — E119 Type 2 diabetes mellitus without complications: Secondary | ICD-10-CM | POA: Diagnosis not present

## 2013-02-28 DIAGNOSIS — T8189XA Other complications of procedures, not elsewhere classified, initial encounter: Secondary | ICD-10-CM | POA: Diagnosis not present

## 2013-02-28 DIAGNOSIS — R262 Difficulty in walking, not elsewhere classified: Secondary | ICD-10-CM | POA: Insufficient documentation

## 2013-02-28 NOTE — Progress Notes (Signed)
Physical Therapy Treatment Patient Details  Name: Mary [name] MRN: [id number] Date of Birth: Oct 14, 1945  Today's Date: 02/28/2013 Time: 1302-1420 PT Time Calculation (min): 78 min Charges: Selective debridement (= or < 20 cm) x 30'(1302-1332) NMR x [id number]) Therex x 10' (1410-1420)    Visit#: 18 of 24  Re-eval: 03/12/13  Authorization: [employer]  Authorization Visit#: 18 of 20   Subjective: Symptoms/Limitations Symptoms: Pt states that she fell out of the bed last night and her shoudler is sore. She requests not to hop today. Pain Assessment Currently in Pain?: No/denies  Exercise/Treatments Aerobic Stationary Bike: Nustep 10 minutes seat 10 hills #3 level 3 resistance level 4 SPM avg 80: for LE strengthening Standing Other Standing Knee Exercises: Static standing with reaching all directions and unweight 1 UE at a time. Other Standing Knee Exercises: Standing in front of mirror self-correcting posture  Woundcare:  RLE wounds listed from most medial to lateral  1) 85% slough, 15% granulation  2) 90% slough, 10% granulation  3) Healed  4) Healed  Physical Therapy Assessment and Plan PT Assessment and Plan Clinical Impression Statement: Continued wound care on 2 medial wounds. Small area noted at distal point of residual limb. Area is superficial and appears to be from blister. Xeroform applied to distal part of residual limb to improve skin integrity. Continued use of silver dressings. Noted dry, callous distal residual limb. Ambulation not completed this session secondary to shoulder pain when weight is placed throughout right arm. Focused on static standing and blance activities. Increased resistance with NuStep with minimal difficulty. PT Plan: Continue with wound care and LE strengthening to reach functional goals.      Problem List Patient Active Problem List   Diagnosis Date Noted  . Unstable balance 01/16/2013  . Difficulty in  walking(719.7) 01/16/2013  . Infected wound 01/16/2013  . Hx of right BKA 12/23/2012  . Candidiasis of breast 12/08/2012  . S/P bilateral BKA (below knee amputation) 11/30/2012  . Diabetes 11/30/2012  . Diabetic foot ulcer 10/08/2012  . UTI (lower urinary tract infection) 10/07/2012  . Bacteremia due to Staphylococcus aureus 10/07/2012  . Anxiety 02/17/2012  . Osteomyelitis of right foot 02/14/2012  . Hematuria, microscopic 02/13/2012  . Hyponatremia 02/13/2012  . PVD (peripheral vascular disease) 02/08/2012  . Open wound of heel 02/08/2012  . Hyperlipidemia 11/11/2011  . Hypertension 11/11/2011  . Morbid obesity 11/11/2011  . Tachycardia 08/04/2011  . Acute exacerbation of CHF (congestive heart failure) 07/25/2011  . Diabetes mellitus with neuropathy 07/25/2011  . OSA (obstructive sleep apnea) 07/25/2011  . Acute bronchitis 07/25/2011  . Generalized weakness 07/25/2011  . Acute respiratory failure 07/25/2011  . Restless leg syndrome 07/25/2011  . Anemia 07/25/2011    PT - End of Session Equipment Utilized During Treatment: Gait belt Activity Tolerance: Patient tolerated treatment well General Behavior During Therapy: [facility] for tasks assessed/performed  Rachelle Hora, PTA  02/28/2013, 2:28 PM

## 2013-03-01 DIAGNOSIS — IMO0001 Reserved for inherently not codable concepts without codable children: Secondary | ICD-10-CM | POA: Diagnosis not present

## 2013-03-02 ENCOUNTER — Ambulatory Visit (HOSPITAL_COMMUNITY)
Admission: RE | Admit: 2013-03-02 | Discharge: 2013-03-02 | Disposition: A | Discharge status: Home / Self Care | Payer: Medicare Other | Source: Ambulatory Visit | Attending: Internal Medicine | Admitting: Internal Medicine

## 2013-03-02 DIAGNOSIS — R262 Difficulty in walking, not elsewhere classified: Secondary | ICD-10-CM

## 2013-03-02 DIAGNOSIS — IMO0001 Reserved for inherently not codable concepts without codable children: Secondary | ICD-10-CM | POA: Diagnosis not present

## 2013-03-02 DIAGNOSIS — T798XXS Other early complications of trauma, sequela: Secondary | ICD-10-CM

## 2013-03-02 DIAGNOSIS — R2689 Other abnormalities of gait and mobility: Secondary | ICD-10-CM

## 2013-03-02 NOTE — Progress Notes (Addendum)
Physical Therapy Treatment Patient Details  Name: Mary [name] MRN: [id number] Date of Birth: Sep 30, 1945  Today's Date: 03/02/2013 Time: 1302-1400 PT Time Calculation (min): 58 min Charges: TE: x15 minutes Wound care < 20cm Self Care: 15 min Visit#: 19 of 24  Re-eval: 03/12/13    Authorization: [employer]  Authorization Time Period:    Authorization Visit#: 19 of 20   Subjective: Symptoms/Limitations Symptoms: Pt states that her arms are sore today.  States she slipped while transfering to her scooter when trying to take her dog outside Pain Assessment Currently in Pain?: Yes Pain Score: 6  (FACES) Pain Location: Arm Pain Orientation: Right;Left Pain Type: Acute pain  Precautions/Restrictions     Exercise/Treatments Standing Gait Training: 82 feet w/1 rest break w/min guard A and RW Seated: Sit to stand: 4 reps throughout session w/min guard-min A    Woundcare:  RLE wounds listed from most medial to lateral  1) 85% slough, 15% granulation - applied honey gel and gauze 2) 80% slough, 20% granulation - Honeygel and gauze 3) Healed  4) Healed  New Wounds 5)  Between wound 1 and 2: 100% granulated: honey gel, gauze 6) distal end of residual limb: Blister 100%: honey gel gauze  Physical Therapy Assessment and Plan PT Assessment and Plan Clinical Impression Statement: Pt started with gait training today and ended with wound care.  Applied honey gel to all wounds today, placed gauze over top and metaphor tape.  Mosterized skin around wounds as able without effecting metaphor tape.  After wound care pt reports she is not feeling well and she feels her sugar is low.  Stated she ate cottage cheese and peaches early this morning and took a shot of insulin at lunch time without eating anything.  Provided pt with candy and water and discussed the importance of proper eating before therapy.  Also discussed with pt goals of walking in her home and used teach  back to state how far and how many times she would be walking this weekend.  PT Plan: RE-EVAL NEXT VISIT/G-code    Goals Home Exercise Program Pt/caregiver will Perform Home Exercise Program: For increased strengthening (Bed exercises) PT Short Term Goals Time to Complete Short Term Goals: 2 weeks PT Short Term Goal 1: Wound to be 80% granulated PT Short Term Goal 1 - Progress: Progressing toward goal PT Short Term Goal 2: Pt to be able to ambulate with RW x 50 ft. PT Short Term Goal 2 - Progress: Met PT Short Term Goal 3: Pt to be able to verbalize the importance of keeping her sugar under control in order to keep from becoming an AKA vs. BKA (discussed today recommendations from MD) PT Short Term Goal 3 - Progress: Met PT Long Term Goals Time to Complete Long Term Goals: 8 weeks PT Long Term Goal 1: Pt wound to be healed PT Long Term Goal 1 - Progress: Progressing toward goal PT Long Term Goal 2: Pt to be wearing her shrinker daily PT Long Term Goal 2 - Progress: Met Long Term Goal 3: Pt to be able to don and doff prothesis Long Term Goal 3 Progress: Not met Long Term Goal 4: Pt to be able to walk I with prothesis with least assistive devise x 300 ft  Long Term Goal 4 Progress: Not met PT Long Term Goal 5: Pt to verbalize the importance of inspecting Rt stump and Lt feet on a daily basis. Long Term Goal 5 Progress: Met Additional PT Long  Term Goals?: Yes PT Long Term Goal 6: Pt to be able to step up and off a curb I with prothesis and least assistive device. Long Term Goal 6 Progress: Not met  Problem List Patient Active Problem List   Diagnosis Date Noted  . Unstable balance 01/16/2013  . Difficulty in walking(719.7) 01/16/2013  . Infected wound 01/16/2013  . Hx of right BKA 12/23/2012  . Candidiasis of breast 12/08/2012  . S/P bilateral BKA (below knee amputation) 11/30/2012  . Diabetes 11/30/2012  . Diabetic foot ulcer 10/08/2012  . UTI (lower urinary tract infection)  10/07/2012  . Bacteremia due to Staphylococcus aureus 10/07/2012  . Anxiety 02/17/2012  . Osteomyelitis of right foot 02/14/2012  . Hematuria, microscopic 02/13/2012  . Hyponatremia 02/13/2012  . PVD (peripheral vascular disease) 02/08/2012  . Open wound of heel 02/08/2012  . Hyperlipidemia 11/11/2011  . Hypertension 11/11/2011  . Morbid obesity 11/11/2011  . Tachycardia 08/04/2011  . Acute exacerbation of CHF (congestive heart failure) 07/25/2011  . Diabetes mellitus with neuropathy 07/25/2011  . OSA (obstructive sleep apnea) 07/25/2011  . Acute bronchitis 07/25/2011  . Generalized weakness 07/25/2011  . Acute respiratory failure 07/25/2011  . Restless leg syndrome 07/25/2011  . Anemia 07/25/2011    PT - End of Session Equipment Utilized During Treatment: Gait belt Activity Tolerance: Patient tolerated treatment well General Behavior During Therapy: [facility] for tasks assessed/performed PT Plan of Care PT Patient Instructions: Teach back for walking 3x/day for 65 ft and eating at the table without the scooter.   GP    Shannan Slinker, MPT, ATC 03/02/2013, 2:18 PM

## 2013-03-05 ENCOUNTER — Ambulatory Visit (HOSPITAL_COMMUNITY)
Admission: RE | Admit: 2013-03-05 | Discharge: 2013-03-05 | Disposition: A | Discharge status: Home / Self Care | Payer: Medicare Other | Source: Ambulatory Visit | Attending: Internal Medicine | Admitting: Internal Medicine

## 2013-03-05 DIAGNOSIS — IMO0001 Reserved for inherently not codable concepts without codable children: Secondary | ICD-10-CM | POA: Diagnosis not present

## 2013-03-05 NOTE — Progress Notes (Signed)
Physical Therapy Treatment Patient Details  Name: Mary [name] MRN: [id number] Date of Birth: 07-Jul-1945  Today's Date: 03/05/2013 Time: 0805-0908 PT Time Calculation (min): 63 min Charges: Selective debridement (= or < 20 cm) [id number]) Gait x [id number]) Therex x [id number])   Visit#: 20 of 24  Re-eval: 03/12/13  Authorization: [employer]  Authorization Visit#: 20 of 20   Subjective: Symptoms/Limitations Symptoms: Pt states that she does not feel well today. She states that she is tired and her leg feels weak. Pain Assessment Currently in Pain?: No/denies  Exercise/Treatments Aerobic Stationary Bike: Nustep 12 minutes seat 10 hills #3 level 4 resistance level 4 SPM avg 80: for LE strengthening Standing Gait Training: 52',25' with rolling walker  Woundcare:  RLE wounds listed from most medial to lateral  1) 85% slough, 15% granulation - Aquacel  2) 80% slough, 20% granulation - Aquacel   3) Healed  4) Healed  5) Between wound 1 and 2: 100% granulated: Xeroform 6) distal end of residual limb: Blister 100%: Xeroform All wounds covered with ABD and anchored with medipore. Shinker applied over dressing.   Physical Therapy Assessment and Plan PT Assessment and Plan Clinical Impression Statement: Wounds care completed first this session. Wounds continues to progress well. Changed dressing type back to silver secondary to significant slough and drainage. The two new wounds are almost healed, xeroform applied to these areas. Pt is able to ambulate 52 without stopping and then another 25' After seated rest break. Pt tolerates increased resistance and time with NuStep well. PT Plan: Continue wound care and strengthening/gait training per PT POC.     Problem List Patient Active Problem List   Diagnosis Date Noted  . Unstable balance 01/16/2013  . Difficulty in walking(719.7) 01/16/2013  . Infected wound 01/16/2013  . Hx of right BKA 12/23/2012  .  Candidiasis of breast 12/08/2012  . S/P bilateral BKA (below knee amputation) 11/30/2012  . Diabetes 11/30/2012  . Diabetic foot ulcer 10/08/2012  . UTI (lower urinary tract infection) 10/07/2012  . Bacteremia due to Staphylococcus aureus 10/07/2012  . Anxiety 02/17/2012  . Osteomyelitis of right foot 02/14/2012  . Hematuria, microscopic 02/13/2012  . Hyponatremia 02/13/2012  . PVD (peripheral vascular disease) 02/08/2012  . Open wound of heel 02/08/2012  . Hyperlipidemia 11/11/2011  . Hypertension 11/11/2011  . Morbid obesity 11/11/2011  . Tachycardia 08/04/2011  . Acute exacerbation of CHF (congestive heart failure) 07/25/2011  . Diabetes mellitus with neuropathy 07/25/2011  . OSA (obstructive sleep apnea) 07/25/2011  . Acute bronchitis 07/25/2011  . Generalized weakness 07/25/2011  . Acute respiratory failure 07/25/2011  . Restless leg syndrome 07/25/2011  . Anemia 07/25/2011    PT - End of Session Equipment Utilized During Treatment: Gait belt Activity Tolerance: Patient tolerated treatment well General Behavior During Therapy: [facility] for tasks assessed/performed  GP Functional Assessment Tool Used: clinical judgement Functional Limitation: Self care Self Care Current Status [id number]): At least 40 percent but less than 60 percent impaired, limited or restricted Self Care Goal Status [id number]): At least 20 percent but less than 40 percent impaired, limited or restricted  Rachelle Hora, PTA  03/05/2013, 9:24 AM

## 2013-03-07 ENCOUNTER — Ambulatory Visit (HOSPITAL_COMMUNITY): Payer: Medicare Other | Admitting: Physical Therapy

## 2013-03-07 ENCOUNTER — Telehealth (HOSPITAL_COMMUNITY): Payer: Self-pay

## 2013-03-09 ENCOUNTER — Ambulatory Visit (HOSPITAL_COMMUNITY): Payer: Medicare Other | Admitting: Physical Therapy

## 2013-03-12 ENCOUNTER — Ambulatory Visit (HOSPITAL_COMMUNITY)
Admission: RE | Admit: 2013-03-12 | Discharge: 2013-03-12 | Disposition: A | Discharge status: Home / Self Care | Payer: Medicare Other | Source: Ambulatory Visit | Attending: Internal Medicine | Admitting: Internal Medicine

## 2013-03-12 DIAGNOSIS — IMO0001 Reserved for inherently not codable concepts without codable children: Secondary | ICD-10-CM | POA: Diagnosis not present

## 2013-03-12 DIAGNOSIS — R262 Difficulty in walking, not elsewhere classified: Secondary | ICD-10-CM

## 2013-03-12 DIAGNOSIS — R2689 Other abnormalities of gait and mobility: Secondary | ICD-10-CM

## 2013-03-12 NOTE — Progress Notes (Signed)
Physical Therapy Treatment Patient Details  Name: Mary [name] MRN: [id number] Date of Birth: [birth year]  Today's Date: 03/12/2013 Time: [id number] PT Time Calculation (min): 82 min Charge: deb <20cm [id number], Gait [phone], Therex 0942-1002, NMR 1003-1012  Visit#: 21 of 24  Re-eval: 03/12/13 Assessment Diagnosis: R BKA Surgical Date: 11/25/12  Authorization: [employer]  Authorization Time Period:    Authorization Visit#: 21 of 30   Subjective: Symptoms/Limitations Symptoms: Pt sized for new prostetic limb and brought in video of her walking, pt really excited and should be receiving new prostetic this week.  Currently no pain.  Noted irritation of skin that they applied to keep bandaids on limb over weekend.   Pain Assessment Currently in Pain?: No/denies  Precautions/Restrictions  Precautions Precautions: Fall  Exercise/Treatments Aerobic Stationary Bike: Nustep 15 minutes seat 10 hills #3 level 4 resistance level 4 SPM avg 88: for LE strengthening Standing Gait Training: 2 attempts 42feet, 28 feets with rolling walker Other Standing Knee Exercises: Static standing with reaching all directions and unweight 1 UE at a time. Seated Long Arc Quad: Both;15 reps;Weights Long Arc Quad Weight: 5 lbs. Other Seated Knee Exercises: adduction with orange ball 15x 5", abduction with green tband  Wound care  RLE wounds listed from most medial to lateral  1) 85% slough, 15% granulation - Aquacel  2) 80% slough, 20% granulation - Aquacel  3) Healed  4) Healed  5)Healed 6) Healed (Xeroform applied over skin irritation from tape) All wounds covered with ABD and anchored with medipore. Shinker applied over dressing.      Physical Therapy Assessment and Plan PT Assessment and Plan Clinical Impression Statement: Wounds progressing well with decrease in overall size and increase ease of slough removal.  Wounds #3-6 fullty healed today.  Continued with silver  dressings to reduce slough and drainage.  Applied xeroform over skin irritated by tape.  Session focus on improving confidence with gait mechanics and balance training to improve stance with 1 HHA.   PT Plan: Continue wound care and strengthening/gait training per PT POC.    Goals    Problem List Patient Active Problem List   Diagnosis Date Noted  . Unstable balance 01/16/2013  . Difficulty in walking(719.7) 01/16/2013  . Infected wound 01/16/2013  . Hx of right BKA 12/23/2012  . Candidiasis of breast 12/08/2012  . S/P bilateral BKA (below knee amputation) 11/30/2012  . Diabetes 11/30/2012  . Diabetic foot ulcer 10/08/2012  . UTI (lower urinary tract infection) 10/07/2012  . Bacteremia due to Staphylococcus aureus 10/07/2012  . Anxiety 02/17/2012  . Osteomyelitis of right foot 02/14/2012  . Hematuria, microscopic 02/13/2012  . Hyponatremia 02/13/2012  . PVD (peripheral vascular disease) 02/08/2012  . Open wound of heel 02/08/2012  . Hyperlipidemia 11/11/2011  . Hypertension 11/11/2011  . Morbid obesity 11/11/2011  . Tachycardia 08/04/2011  . Acute exacerbation of CHF (congestive heart failure) 07/25/2011  . Diabetes mellitus with neuropathy 07/25/2011  . OSA (obstructive sleep apnea) 07/25/2011  . Acute bronchitis 07/25/2011  . Generalized weakness 07/25/2011  . Acute respiratory failure 07/25/2011  . Restless leg syndrome 07/25/2011  . Anemia 07/25/2011    PT - End of Session Equipment Utilized During Treatment: Gait belt Activity Tolerance: Patient tolerated treatment well General Behavior During Therapy: [facility] for tasks assessed/performed  GP    Aldona Lento 03/12/2013, 12:58 PM

## 2013-03-14 ENCOUNTER — Ambulatory Visit (HOSPITAL_COMMUNITY)
Admission: RE | Admit: 2013-03-14 | Discharge: 2013-03-14 | Disposition: A | Discharge status: Home / Self Care | Payer: Medicare Other | Source: Ambulatory Visit | Attending: Internal Medicine | Admitting: Internal Medicine

## 2013-03-14 ENCOUNTER — Ambulatory Visit (HOSPITAL_COMMUNITY): Payer: Medicare Other | Admitting: Physical Therapy

## 2013-03-14 DIAGNOSIS — IMO0001 Reserved for inherently not codable concepts without codable children: Secondary | ICD-10-CM | POA: Diagnosis not present

## 2013-03-14 NOTE — Progress Notes (Signed)
Physical Therapy Treatment Patient Details  Name: Mary [name] MRN: [id number] Date of Birth: [birth year]  Today's Date: 03/14/2013 Time: 1103-1200 PT Time Calculation (min): 57 min Charges: Selective debridement (= or < 20 cm)(1103-1120) Gait x 23'(1122-1145) Prosthetic training x [id number])   Visit#: 22 of 24  Re-eval: 03/12/13  Authorization: [employer]  Authorization Visit#: 22 of 30   Subjective: Symptoms/Limitations Symptoms: Pt states that she has been walking with prosthitic limba at home. Pain Assessment Currently in Pain?: No/denies  Wound care  RLE wounds listed from most medial to lateral  1) 85% slough, 15% granulation - Xeroform  2) 80% slough, 20% granulation - Xeroform  3) Healed  4) Healed  5)Healed  6) Healed  (Xeroform applied over skin irritation from tape)  All wounds covered with ABD and anchored with medipore. Shinker applied over dressing.  Exercise/Treatments Standing Gait Training: Gait training 120' with walker; 60'x2 with hemi walker Other Standing Knee Exercises: Static standing with reaching all directions withotu UE assistance Seated Other Seated Knee Exercises: Donning/doffing prosthesis x 3 with multimodal cueing and moderate assistance  Physical Therapy Assessment and Plan PT Assessment and Plan Clinical Impression Statement: Wounds continue to progress well. Irritated area appears healthier and is almost healed. Only two wounds remain open and they are progressing well. Pt presents to clinic with prosthetic limb donned. Pt is able to walk with walker with minimal difficulty. Began gait training with hemi walker with CGA and cueing to increase weight shift to RLE. Encouraged pt to practice donning and doffing prosthesis at home to improve independence. Over all pt is progressing well toward goals. PT Plan: Continue wound care and strengthening/gait training per PT POC.     Problem List Patient Active Problem List    Diagnosis Date Noted  . Unstable balance 01/16/2013  . Difficulty in walking(719.7) 01/16/2013  . Infected wound 01/16/2013  . Hx of right BKA 12/23/2012  . Candidiasis of breast 12/08/2012  . S/P bilateral BKA (below knee amputation) 11/30/2012  . Diabetes 11/30/2012  . Diabetic foot ulcer 10/08/2012  . UTI (lower urinary tract infection) 10/07/2012  . Bacteremia due to Staphylococcus aureus 10/07/2012  . Anxiety 02/17/2012  . Osteomyelitis of right foot 02/14/2012  . Hematuria, microscopic 02/13/2012  . Hyponatremia 02/13/2012  . PVD (peripheral vascular disease) 02/08/2012  . Open wound of heel 02/08/2012  . Hyperlipidemia 11/11/2011  . Hypertension 11/11/2011  . Morbid obesity 11/11/2011  . Tachycardia 08/04/2011  . Acute exacerbation of CHF (congestive heart failure) 07/25/2011  . Diabetes mellitus with neuropathy 07/25/2011  . OSA (obstructive sleep apnea) 07/25/2011  . Acute bronchitis 07/25/2011  . Generalized weakness 07/25/2011  . Acute respiratory failure 07/25/2011  . Restless leg syndrome 07/25/2011  . Anemia 07/25/2011    PT - End of Session Equipment Utilized During Treatment: Gait belt Activity Tolerance: Patient tolerated treatment well General Behavior During Therapy: [facility] for tasks assessed/performed  Rachelle Hora, PTA  03/14/2013, 1:18 PM

## 2013-03-16 ENCOUNTER — Inpatient Hospital Stay (HOSPITAL_COMMUNITY): Payer: Medicare Other

## 2013-03-16 ENCOUNTER — Observation Stay (HOSPITAL_COMMUNITY): Payer: Medicare Other

## 2013-03-16 ENCOUNTER — Ambulatory Visit (HOSPITAL_COMMUNITY)
Admission: RE | Admit: 2013-03-16 | Discharge: 2013-03-16 | Disposition: A | Discharge status: Home / Self Care | Payer: Medicare Other | Source: Ambulatory Visit | Attending: Internal Medicine | Admitting: Internal Medicine

## 2013-03-16 ENCOUNTER — Observation Stay (HOSPITAL_COMMUNITY)
Admission: EM | Admit: 2013-03-16 | Discharge: 2013-03-17 | Disposition: A | Discharge status: Home / Self Care | Payer: Medicare Other | Attending: Internal Medicine | Admitting: Internal Medicine

## 2013-03-16 ENCOUNTER — Emergency Department (HOSPITAL_COMMUNITY): Payer: Medicare Other

## 2013-03-16 ENCOUNTER — Encounter (HOSPITAL_COMMUNITY): Payer: Self-pay | Admitting: Emergency Medicine

## 2013-03-16 ENCOUNTER — Ambulatory Visit (HOSPITAL_COMMUNITY): Payer: Medicare Other | Admitting: Physical Therapy

## 2013-03-16 DIAGNOSIS — E162 Hypoglycemia, unspecified: Secondary | ICD-10-CM | POA: Diagnosis present

## 2013-03-16 DIAGNOSIS — G459 Transient cerebral ischemic attack, unspecified: Secondary | ICD-10-CM

## 2013-03-16 DIAGNOSIS — E785 Hyperlipidemia, unspecified: Secondary | ICD-10-CM

## 2013-03-16 DIAGNOSIS — I10 Essential (primary) hypertension: Secondary | ICD-10-CM | POA: Diagnosis present

## 2013-03-16 DIAGNOSIS — I517 Cardiomegaly: Secondary | ICD-10-CM

## 2013-03-16 DIAGNOSIS — G4733 Obstructive sleep apnea (adult) (pediatric): Secondary | ICD-10-CM | POA: Diagnosis present

## 2013-03-16 DIAGNOSIS — N179 Acute kidney failure, unspecified: Secondary | ICD-10-CM

## 2013-03-16 DIAGNOSIS — N189 Chronic kidney disease, unspecified: Secondary | ICD-10-CM | POA: Insufficient documentation

## 2013-03-16 DIAGNOSIS — F419 Anxiety disorder, unspecified: Secondary | ICD-10-CM | POA: Diagnosis present

## 2013-03-16 DIAGNOSIS — D649 Anemia, unspecified: Secondary | ICD-10-CM

## 2013-03-16 DIAGNOSIS — E119 Type 2 diabetes mellitus without complications: Secondary | ICD-10-CM

## 2013-03-16 DIAGNOSIS — F411 Generalized anxiety disorder: Secondary | ICD-10-CM | POA: Insufficient documentation

## 2013-03-16 DIAGNOSIS — Z89511 Acquired absence of right leg below knee: Secondary | ICD-10-CM

## 2013-03-16 DIAGNOSIS — G9340 Encephalopathy, unspecified: Principal | ICD-10-CM

## 2013-03-16 DIAGNOSIS — I509 Heart failure, unspecified: Secondary | ICD-10-CM

## 2013-03-16 DIAGNOSIS — R262 Difficulty in walking, not elsewhere classified: Secondary | ICD-10-CM

## 2013-03-16 DIAGNOSIS — R2689 Other abnormalities of gait and mobility: Secondary | ICD-10-CM

## 2013-03-16 DIAGNOSIS — I129 Hypertensive chronic kidney disease with stage 1 through stage 4 chronic kidney disease, or unspecified chronic kidney disease: Secondary | ICD-10-CM | POA: Insufficient documentation

## 2013-03-16 DIAGNOSIS — S88119A Complete traumatic amputation at level between knee and ankle, unspecified lower leg, initial encounter: Secondary | ICD-10-CM | POA: Insufficient documentation

## 2013-03-16 DIAGNOSIS — IMO0001 Reserved for inherently not codable concepts without codable children: Secondary | ICD-10-CM | POA: Diagnosis not present

## 2013-03-16 LAB — URINALYSIS, ROUTINE W REFLEX MICROSCOPIC
Ketones, ur: NEGATIVE mg/dL
Leukocytes, UA: NEGATIVE
Nitrite: NEGATIVE
Protein, ur: NEGATIVE mg/dL
Specific Gravity, Urine: 1.015 (ref 1.005–1.030)
Urobilinogen, UA: 0.2 mg/dL (ref 0.0–1.0)
pH: 6 (ref 5.0–8.0)

## 2013-03-16 LAB — DIFFERENTIAL
Basophils Absolute: 0 10*3/uL (ref 0.0–0.1)
Basophils Relative: 0 % (ref 0–1)
Eosinophils Absolute: 0.4 10*3/uL (ref 0.0–0.7)
Lymphocytes Relative: 25 % (ref 12–46)
Monocytes Absolute: 0.6 10*3/uL (ref 0.1–1.0)
Neutro Abs: 4.8 10*3/uL (ref 1.7–7.7)

## 2013-03-16 LAB — COMPREHENSIVE METABOLIC PANEL
ALT: 11 U/L (ref 0–35)
AST: 13 U/L (ref 0–37)
Alkaline Phosphatase: 93 U/L (ref 39–117)
CO2: 27 mEq/L (ref 19–32)
Chloride: 103 mEq/L (ref 96–112)
Creatinine, Ser: 1.4 mg/dL — ABNORMAL HIGH (ref 0.50–1.10)
GFR calc non Af Amer: 38 mL/min — ABNORMAL LOW (ref >90)
Potassium: 3.9 mEq/L (ref 3.5–5.1)
Total Bilirubin: 0.2 mg/dL — ABNORMAL LOW (ref 0.3–1.2)
Total Protein: 6.7 g/dL (ref 6.0–8.3)

## 2013-03-16 LAB — GLUCOSE, CAPILLARY
Comment 1: 0
Glucose-Capillary: 142 mg/dL — ABNORMAL HIGH (ref 70–99)
Glucose-Capillary: 66 mg/dL — ABNORMAL LOW (ref 70–99)
Glucose-Capillary: 67 mg/dL — ABNORMAL LOW (ref 70–99)
Glucose-Capillary: 146 mg/dL — ABNORMAL HIGH (ref 70–99)
Glucose-Capillary: 216 mg/dL — ABNORMAL HIGH (ref 70–99)

## 2013-03-16 LAB — URINE MICROSCOPIC-ADD ON

## 2013-03-16 LAB — RAPID URINE DRUG SCREEN, HOSP PERFORMED
Barbiturates: NOT DETECTED
Cocaine: NOT DETECTED
Opiates: NOT DETECTED

## 2013-03-16 LAB — LIPID PANEL
LDL Cholesterol: 113 mg/dL — ABNORMAL HIGH (ref 0–99)
Total CHOL/HDL Ratio: 4 RATIO
Triglycerides: 113 mg/dL (ref <150)
VLDL: 23 mg/dL (ref 0–40)

## 2013-03-16 LAB — TROPONIN I
Troponin I: <0.3 ng/mL (ref <0.30)
Troponin I: <0.3 ng/mL (ref <0.30)

## 2013-03-16 LAB — ETHANOL: Alcohol, Ethyl (B): <11 mg/dL (ref 0–11)

## 2013-03-16 LAB — APTT: aPTT: 33 seconds (ref 24–37)

## 2013-03-16 LAB — CREATININE, SERUM
Creatinine, Ser: 1.4 mg/dL — ABNORMAL HIGH (ref 0.50–1.10)
GFR calc Af Amer: 44 mL/min — ABNORMAL LOW (ref >90)

## 2013-03-16 LAB — CBC
HCT: 32.4 % — ABNORMAL LOW (ref 36.0–46.0)
Hemoglobin: 10.8 g/dL — ABNORMAL LOW (ref 12.0–15.0)
MCHC: 33.3 g/dL (ref 30.0–36.0)
MCV: 83.7 fL (ref 78.0–100.0)
RDW: 14.7 % (ref 11.5–15.5)
WBC: 7.6 10*3/uL (ref 4.0–10.5)

## 2013-03-16 MED ORDER — DEXTROSE 50 % IV SOLN
INTRAVENOUS | Status: AC
  Filled 2013-03-16: qty 50

## 2013-03-16 MED ORDER — ENOXAPARIN SODIUM 40 MG/0.4ML ~~LOC~~ SOLN
40.0000 mg | SUBCUTANEOUS | Status: DC
  Administered 2013-03-16: 40 mg via SUBCUTANEOUS
  Filled 2013-03-16: qty 0.4

## 2013-03-16 MED ORDER — FERROUS SULFATE 325 (65 FE) MG PO TABS
325.0000 mg | ORAL_TABLET | Freq: Every morning | ORAL | Status: DC
  Administered 2013-03-17: 325 mg via ORAL
  Filled 2013-03-16: qty 1

## 2013-03-16 MED ORDER — ALUM & MAG HYDROXIDE-SIMETH 200-200-20 MG/5ML PO SUSP: 30.0000 mL | Freq: Four times a day (QID) | ORAL | Status: DC | PRN

## 2013-03-16 MED ORDER — SENNA 8.6 MG PO TABS
1.0000 | ORAL_TABLET | Freq: Two times a day (BID) | ORAL | Status: DC
  Administered 2013-03-16 – 2013-03-17 (×3): 8.6 mg via ORAL
  Filled 2013-03-16 (×3): qty 1

## 2013-03-16 MED ORDER — ACETAMINOPHEN 325 MG PO TABS: 650.0000 mg | ORAL_TABLET | Freq: Four times a day (QID) | ORAL | Status: DC | PRN

## 2013-03-16 MED ORDER — DEXTROSE 50 % IV SOLN
50.0000 mL | Freq: Once | INTRAVENOUS | Status: AC
  Administered 2013-03-16: 50 mL via INTRAVENOUS

## 2013-03-16 MED ORDER — ACETAMINOPHEN 650 MG RE SUPP: 650.0000 mg | Freq: Four times a day (QID) | RECTAL | Status: DC | PRN

## 2013-03-16 MED ORDER — SODIUM CHLORIDE 0.9 % IJ SOLN
3.0000 mL | Freq: Two times a day (BID) | INTRAMUSCULAR | Status: DC
  Administered 2013-03-16: 3 mL via INTRAVENOUS

## 2013-03-16 MED ORDER — PRAMIPEXOLE DIHYDROCHLORIDE 1 MG PO TABS
0.5000 mg | ORAL_TABLET | Freq: Two times a day (BID) | ORAL | Status: DC
  Administered 2013-03-17: 0.5 mg via ORAL
  Filled 2013-03-16: qty 1

## 2013-03-16 MED ORDER — ASPIRIN EC 325 MG PO TBEC
325.0000 mg | DELAYED_RELEASE_TABLET | Freq: Every day | ORAL | Status: DC
  Administered 2013-03-16 – 2013-03-17 (×2): 325 mg via ORAL
  Filled 2013-03-16 (×2): qty 1

## 2013-03-16 MED ORDER — INSULIN ASPART 100 UNIT/ML ~~LOC~~ SOLN
0.0000 [IU] | Freq: Three times a day (TID) | SUBCUTANEOUS | Status: DC
  Administered 2013-03-17: 3 [IU] via SUBCUTANEOUS

## 2013-03-16 MED ORDER — ONDANSETRON HCL 4 MG/2ML IJ SOLN: 4.0000 mg | Freq: Four times a day (QID) | INTRAMUSCULAR | Status: DC | PRN

## 2013-03-16 MED ORDER — SIMVASTATIN 10 MG PO TABS
5.0000 mg | ORAL_TABLET | Freq: Every day | ORAL | Status: DC
  Administered 2013-03-16: 5 mg via ORAL
  Filled 2013-03-16: qty 1

## 2013-03-16 MED ORDER — ENOXAPARIN SODIUM 40 MG/0.4ML ~~LOC~~ SOLN: 40.0000 mg | SUBCUTANEOUS | Status: DC

## 2013-03-16 MED ORDER — ONDANSETRON HCL 4 MG PO TABS: 4.0000 mg | ORAL_TABLET | Freq: Four times a day (QID) | ORAL | Status: DC | PRN

## 2013-03-16 MED ORDER — BISACODYL 10 MG RE SUPP: 10.0000 mg | Freq: Every day | RECTAL | Status: DC | PRN

## 2013-03-16 MED ORDER — TRAZODONE HCL 50 MG PO TABS: 50.0000 mg | ORAL_TABLET | Freq: Every evening | ORAL | Status: DC | PRN

## 2013-03-16 MED ORDER — METOPROLOL TARTRATE 50 MG PO TABS
50.0000 mg | ORAL_TABLET | Freq: Two times a day (BID) | ORAL | Status: DC
  Administered 2013-03-16 – 2013-03-17 (×2): 50 mg via ORAL
  Filled 2013-03-16 (×2): qty 1

## 2013-03-16 MED ORDER — PRAMIPEXOLE DIHYDROCHLORIDE 1 MG PO TABS
1.0000 mg | ORAL_TABLET | Freq: Every day | ORAL | Status: DC
  Administered 2013-03-16: 1 mg via ORAL
  Filled 2013-03-16: qty 1

## 2013-03-16 MED ORDER — SODIUM CHLORIDE 0.9 % IV SOLN
INTRAVENOUS | Status: DC
  Administered 2013-03-16 – 2013-03-17 (×2): via INTRAVENOUS

## 2013-03-16 NOTE — ED Provider Notes (Signed)
CSN: [id number]     Arrival date & time 03/16/13  [id number] History  This chart was scribed for Mary Cable, MD by Marlowe Kays, ED Scribe. This patient was seen in room APA18/APA18 and the patient's care was started at 9:45 AM.  Chief Complaint  Patient presents with  . Altered Mental Status   Patient is a 67 y.o. female presenting with altered mental status. The history is provided by the patient and the spouse. No language interpreter was used.  Altered Mental Status Presenting symptoms: confusion, disorientation and memory loss   Presenting symptoms: no combativeness, no partial responsiveness and no unresponsiveness   Severity:  Moderate Most recent episode:  Today Episode history:  Multiple Timing:  Intermittent Progression:  Improving Chronicity:  Recurrent Context: head injury (4-5 months ago)   Recent head injury: 4-5 months ago. Associated symptoms: light-headedness and nausea   Associated symptoms: no abdominal pain, normal movement, no eye deviation, no fever, no headaches, no rash, no seizures and no slurred speech    HPI Comments:  Mary [name] is a 67 y.o. female who presents to the Emergency Department complaining of altered mental status. Her husband reports the pt was in physical therapy and became nauseous and light-headed and became confused. She states she does not remember any of the previous events. Husband reports she has had previous episodes similar to this and does not remember things frequently. Husband states pt fell and hit her head approximately 4-5 months ago. Pt states she knows what century it is but is unsure of the year. She can state her DOB, her husband's name, and what city she currently resides in with no issue. Husband denies any slurred speech today. Pt denies headache, fever, vomiting, abdominal pain, or weakness.  Past Medical History  Diagnosis Date  . CHF (congestive heart failure)   . Diabetes mellitus   . Restless leg syndrome  07/25/2011  . Wound, open     right foot  . Diabetes mellitus with neuropathy 07/25/2011  . Cellulitis and abscess of foot 02/12/2012  . Osteomyelitis of right foot 02/14/2012  . Partial Achilles tendon tear 02/14/2012  . Dysrhythmia     tachy- takes Metoprolol  . OSA (obstructive sleep apnea) 07/25/2011    not using CPAP  . Arthritis     knees  . Anemia    Past Surgical History  Procedure Laterality Date  . Abdominal hysterectomy    . Achilles tendon repair    . Toe amputation      partial rt great toe  . Breast surgery Left     Lumpectomy non ca  . Below knee leg amputation Right 11/25/2012    Dr Sharol Given  . Amputation Right 11/25/2012    Procedure: AMPUTATION BELOW KNEE;  Surgeon: Newt Minion, MD;  Location: [facility];  Service: Orthopedics;  Laterality: Right;  Right Below Knee Amputation   Family History  Problem Relation Age of Onset  . Diabetes Sister    History  Substance Use Topics  . Smoking status: Never Smoker   . Smokeless tobacco: Never Used  . Alcohol Use: No   OB History   Grav Para Term Preterm Abortions TAB SAB Ect Mult Living                 Review of Systems  Constitutional: Negative for fever.  Gastrointestinal: Positive for nausea. Negative for abdominal pain.  Skin: Negative for rash.  Neurological: Positive for light-headedness. Negative for dizziness, seizures, syncope, speech difficulty  and headaches.  Psychiatric/Behavioral: Positive for memory loss and confusion.  All other systems reviewed and are negative.    Allergies  Review of patient's allergies indicates no known allergies.  Home Medications   Current Outpatient Rx  Name  Route  Sig  Dispense  Refill  . ALPRAZolam (XANAX) 1 MG tablet   Oral   Take 1 mg by mouth every 6 (six) hours as needed for anxiety (restless leg).          . cholecalciferol (VITAMIN D) 1000 UNITS tablet   Oral   Take 1,000 Units by mouth 2 (two) times daily.         . cilostazol (PLETAL) 100 MG tablet    Oral   Take 100 mg by mouth daily.         . cyclobenzaprine (FLEXERIL) 5 MG tablet   Oral   Take 5 mg by mouth 2 (two) times daily.          Marland Kitchen FERROUS FUMARATE PO   Oral   Take 5 tablets by mouth daily.         . fluconazole (DIFLUCAN) 100 MG tablet   Oral   Take 1 tablet (100 mg total) by mouth daily.   7 tablet   0   . gabapentin (NEURONTIN) 300 MG capsule   Oral   Take 1 capsule (300 mg total) by mouth 3 (three) times daily.   90 capsule   1   . insulin lispro protamine-insulin lispro (HUMALOG 50/50) (50-50) 100 UNIT/ML SUSP   Subcutaneous   Inject 20-40 Units into the skin 3 (three) times daily with meals. 40 units in AM 20 units at [facility] and 30 units in PM         . levofloxacin (LEVAQUIN) 500 MG tablet   Oral   Take 500 mg by mouth daily.         . metFORMIN (GLUCOPHAGE) 1000 MG tablet   Oral   Take 1,000 mg by mouth 2 (two) times daily with a meal.         . metoprolol (LOPRESSOR) 50 MG tablet   Oral   Take 1 tablet (50 mg total) by mouth 2 (two) times daily.   180 tablet   3     Please disregard script for Toprol XL 100 mg, as t ...   . Multiple Vitamin (MULTIVITAMIN WITH MINERALS) TABS   Oral   Take 1 tablet by mouth daily.         Marland Kitchen oxyCODONE-acetaminophen (PERCOCET/ROXICET) 5-325 MG per tablet   Oral   Take 1-2 tablets by mouth every 4 (four) hours as needed.   360 tablet   0   . pramipexole (MIRAPEX) 0.5 MG tablet   Oral   Take 0.5-1 mg by mouth 3 (three) times daily with meals. 1 tablet with breakfast and lunch; 2 tablets with supper         . rOPINIRole (REQUIP) 2 MG tablet   Oral   Take 2 mg by mouth every evening.          . traMADol (ULTRAM) 50 MG tablet   Oral   Take 50 mg by mouth 4 (four) times daily as needed for pain.          . traZODone (DESYREL) 50 MG tablet   Oral   Take 50-100 mg by mouth at bedtime. 1-2 tablets depending on severity of RLS          Triage Vitals: BP  115/53  Pulse 71  Temp(Src) 98  F (36.7 C) (Oral)  Resp 20  Ht 5\' 11"  (1.803 m)  Wt 256 lb (116.121 kg)  BMI 35.72 kg/m2  SpO2 100%  LMP 07/25/2011 Physical Exam CONSTITUTIONAL: Well developed/well nourished HEAD: Normocephalic/atraumatic EYES: EOMI/PERRL ENMT: Mucous membranes moist NECK: supple no meningeal signs SPINE:entire spine nontender CV: S1/S2 noted, no murmurs/rubs/gallops noted LUNGS: Lungs are clear to auscultation bilaterally, no apparent distress ABDOMEN: soft, nontender, no rebound or guarding GU:no cva tenderness NEURO: Pt is awake/alert, moves all extremitiesx4, no facial droop, no arm or leg drift, pt is oriented to person, place, but unable to recall date EXTREMITIES: pulses normal, full ROM, right LE prosthesis noted SKIN: warm, color normal PSYCH: no abnormalities of mood noted  ED Course  Procedures (including critical care time) DIAGNOSTIC STUDIES: Oxygen Saturation is 100% on RA, normal by my interpretation.   COORDINATION OF CARE: 9:27 AM- Will obtain lab work and a head CT. Pt verbalizes understanding and agrees to plan.  Results for orders placed during the hospital encounter of 03/16/13  ETHANOL      Result Value Range   Alcohol, Ethyl (B) <11  0 - 11 mg/dL  PROTIME-INR      Result Value Range   Prothrombin Time 13.7  11.6 - 15.2 seconds   INR 1.07  0.00 - 1.49  APTT      Result Value Range   aPTT 33  24 - 37 seconds  CBC      Result Value Range   WBC 7.6  4.0 - 10.5 K/uL   RBC 3.87  3.87 - 5.11 MIL/uL   Hemoglobin 10.8 (*) 12.0 - 15.0 g/dL   HCT 32.4 (*) 36.0 - 46.0 %   MCV 83.7  78.0 - 100.0 fL   MCH 27.9  26.0 - 34.0 pg   MCHC 33.3  30.0 - 36.0 g/dL   RDW 14.7  11.5 - 15.5 %   Platelets 276  150 - 400 K/uL  DIFFERENTIAL      Result Value Range   Neutrophils Relative % 63  43 - 77 %   Neutro Abs 4.8  1.7 - 7.7 K/uL   Lymphocytes Relative 25  12 - 46 %   Lymphs Abs 1.9  0.7 - 4.0 K/uL   Monocytes Relative 8  3 - 12 %   Monocytes Absolute 0.6  0.1 - 1.0 K/uL    Eosinophils Relative 5  0 - 5 %   Eosinophils Absolute 0.4  0.0 - 0.7 K/uL   Basophils Relative 0  0 - 1 %   Basophils Absolute 0.0  0.0 - 0.1 K/uL  COMPREHENSIVE METABOLIC PANEL      Result Value Range   Sodium 140  135 - 145 mEq/L   Potassium 3.9  3.5 - 5.1 mEq/L   Chloride 103  96 - 112 mEq/L   CO2 27  19 - 32 mEq/L   Glucose, Bld 81  70 - 99 mg/dL   BUN 31 (*) 6 - 23 mg/dL   Creatinine, Ser 1.40 (*) 0.50 - 1.10 mg/dL   Calcium 8.8  8.4 - 10.5 mg/dL   Total Protein 6.7  6.0 - 8.3 g/dL   Albumin 3.0 (*) 3.5 - 5.2 g/dL   AST 13  0 - 37 U/L   ALT 11  0 - 35 U/L   Alkaline Phosphatase 93  39 - 117 U/L   Total Bilirubin 0.2 (*) 0.3 - 1.2 mg/dL  GFR calc non Af Amer 38 (*) >90 mL/min   GFR calc Af Amer 44 (*) >90 mL/min  GLUCOSE, CAPILLARY      Result Value Range   Glucose-Capillary 66 (*) 70 - 99 mg/dL  GLUCOSE, CAPILLARY      Result Value Range   Glucose-Capillary 67 (*) 70 - 99 mg/dL  TROPONIN I      Result Value Range   Troponin I <0.30  <0.30 ng/mL   Dg Chest 2 View  03/16/2013   CLINICAL DATA:  Altered mental status  EXAM: CHEST  2 VIEW  COMPARISON:  Oct 09, 2012  FINDINGS: There is subsegmental atelectasis in the left base. There is no edema or consolidation. Heart size and pulmonary vascularity are normal. No adenopathy. There is mild elevation of the right hemidiaphragm.  IMPRESSION: No edema or consolidation.   Electronically Signed   By: Lowella Grip M.D.   On: 03/16/2013 11:09   Ct Head Wo Contrast  03/16/2013   CLINICAL DATA:  Altered mental status.  EXAM: CT HEAD WITHOUT CONTRAST  TECHNIQUE: Contiguous axial images were obtained from the base of the skull through the vertex without intravenous contrast.  COMPARISON:  01/24/2012  FINDINGS: The examination is limited by patient motion. No mass lesion. No midline shift. No acute hemorrhage or hematoma. No extra-axial fluid collections. No evidence of acute infarction. Hypodensity is identified in the anterior  limb of the right internal capsule flow. This is seen on the 01/24/2012 examination, and may represent an old infarct. The calvarium is intact. The visualized paranasal sinuses and mastoid air cells are clear. Orbital soft tissues are unremarkable.  IMPRESSION: No acute intracranial abnormality, with limitations as above.   Electronically Signed   By: Donavan Burnet M.D.   On: 03/16/2013 10:49     10:16 AM tPA in stroke considered but not given due to:  Rapid improvement/severity mild  Pt with episode of confusion while at PT.  Husband reports she has had these episodes with increasing frequency.  Will follow closely 11:58 AM Pt with mild hypoglycemia, but thus far no significant abnormalities Will admit for TIA given episodes of confusion (and husband reports recent h/o slurred speech though none today) D/w dr Caryn Section, will admit   EKG Interpretation   None       MDM  No diagnosis found. Nursing notes including past medical history and social history reviewed and considered in documentation xrays reviewed and considered Labs/vital reviewed and considered      Date: 03/16/2013  Rate: 72  Rhythm: normal sinus rhythm  QRS Axis: normal  Intervals: normal  ST/T Wave abnormalities: nonspecific ST changes  Conduction Disutrbances:none  Narrative Interpretation:   Old EKG Reviewed: unchanged from 12/2011     I personally performed the services described in this documentation, which was scribed in my presence. The recorded information has been reviewed and is accurate.      Mary Cable, MD 03/16/13 1200

## 2013-03-16 NOTE — ED Notes (Signed)
Transfer to floor delayed because patient is in MRI

## 2013-03-16 NOTE — Plan of Care (Signed)
Swallow screen performed and Pt showed no evidence of aspiration or swallowing difficulties.

## 2013-03-16 NOTE — H&P (Signed)
[facility] History and Physical  Mary [name] [id number] DOB: [birth year] DOA: 03/16/2013  Referring physician:  PCP: Mary Cahill, MD  Specialists:   Chief Complaint: AMS, intermittent slurred speech  HPI: Mary [name] is a 67 y.o. female with past medical history that includes diabetes, obesity, hyperlipidemia, hypertension, anxiety, as as to the emergency department from physical therapy clinic with the chief complaint of altered mental status. Information is obtained from the patient and the husband however information from the patient is unreliable do to obvious memory impairment. Patient was at outpatient physical therapy clinic when she suddenly began to feel very nauseated and lightheaded vitals at that time were stable. She lay down and continued to feel bad so rapid response nurse was called. CBG was noted to be 142 at this time. Husband reports at this time she was confused but able to follow commands. And has no memory of the episode. Husband reports patient has been experiencing intermittent episodes of "irritability" over the last several months. In addition he has noticed 3 episodes of brief slurred speech over the last 12 months. Patient reports intermittent weakness In bilateral grip particularly on the left. She does have a history of diabetic neuropathy as well. Husband also reports patient suffered a fall approximately 5 months ago and hit her head. In addition patient reports not checking her blood sugar with any regularity and yet continues to inject herself with 30 units of insulin in the morning and 30 5 in the evening. Husband reports that this morning patient gave herself insulin and they stopped for biscuit on the way to the physical therapy appointment. He denies noticing any confusion or slurred speech or weakness this morning as patient was preparing for her appointment. There has been no recent illness, no fever no chills no sick contacts. Patient denies  headaches visual disturbances numbness or tingling of extremities. She denies chest pain palpitation shortness of breath. She denies abdominal pain diarrhea constipation dysuria hematuria frequency or urgency. She denies melena or hematochezia. She states that she recently started seeing Mary [name] for her diabetes and her most recent hemoglobin A1c in August of this year was between 8 and 9. In the emergency department she was found to have a glucose of 67 and was [name] an amp of D50. Lab work significant for hemoglobin of 10.8 creatinine of 1.4. Urine drug screen was negative as well. CT of the head with no acute intracranial abnormality. Chest x-ray yields no edema or consolidation. Vital signs are unremarkable while in the emergency department. MRI is pending at the time of my exam. Symptoms came on suddenly have persisted characterized as moderate. We are asked to admit for further evaluation and treatment  Review of Systems: 10 point review of systems completed and all systems are negative except as indicated in the history of present illness  Past Medical History  Diagnosis Date  . CHF (congestive heart failure)   . Diabetes mellitus   . Restless leg syndrome 07/25/2011  . Wound, open     right foot  . Diabetes mellitus with neuropathy 07/25/2011  . Cellulitis and abscess of foot 02/12/2012  . Osteomyelitis of right foot 02/14/2012  . Partial Achilles tendon tear 02/14/2012  . Dysrhythmia     tachy- takes Metoprolol  . OSA (obstructive sleep apnea) 07/25/2011    not using CPAP  . Arthritis     knees  . Anemia    Past Surgical History  Procedure Laterality Date  . Abdominal hysterectomy    .  Achilles tendon repair    . Toe amputation      partial rt great toe  . Breast surgery Left     Lumpectomy non ca  . Below knee leg amputation Right 11/25/2012    Dr Mary [name]  . Amputation Right 11/25/2012    Procedure: AMPUTATION BELOW KNEE;  Surgeon: Mary Minion, MD;  Location: [facility];  Service:  Orthopedics;  Laterality: Right;  Right Below Knee Amputation   Social History:  reports that she has never smoked. She has never used smokeless tobacco. She reports that she does not drink alcohol or use illicit drugs. Patient is married and lives with her husband. She is a retired Psychologist, forensic for a college. She is status post right below the knee amputation and 6/14. She has a prosthesis and uses a walker with ambulation. No Known Allergies  Family History  Problem Relation Age of Onset  . Diabetes Sister    mother deceased at 57 she died after complications from bowel obstruction. No history of diabetes stroke or heart attack. Father deceased at the age of 77. Cause death unknown. Patient has 2 siblings whose collective medical history is positive for diabetes and  Prior to Admission medications   Medication Sig Start Date End Date Taking? Authorizing Provider  cholecalciferol (VITAMIN D) 1000 UNITS tablet Take 1,000 Units by mouth 2 (two) times daily.   Yes Historical Provider, MD  Dapagliflozin Propanediol (FARXIGA) 5 MG TABS Take 5 mg by mouth daily.   Yes Historical Provider, MD  ferrous sulfate 325 (65 FE) MG tablet Take 125 mg by mouth every morning. 5 tablets taken by mouth every day   Yes Historical Provider, MD  insulin lispro protamine-insulin lispro (HUMALOG 50/50) (50-50) 100 UNIT/ML SUSP Inject 20-30 Units into the skin 2 (two) times daily before a meal. 30 units in AM and 20 units in PM based on blood sugar levels   Yes Historical Provider, MD  metoprolol (LOPRESSOR) 50 MG tablet Take 1 tablet (50 mg total) by mouth 2 (two) times daily. 04/19/12  Yes Lendon Colonel, NP  Multiple Vitamin (MULTIVITAMIN WITH MINERALS) TABS Take 1 tablet by mouth once a week.    Yes Historical Provider, MD  pramipexole (MIRAPEX) 0.5 MG tablet Take 0.5-1 mg by mouth 3 (three) times daily with meals. 1 tablet with breakfast and lunch; 2 tablets with supper   Yes Historical Provider, MD   pravastatin (PRAVACHOL) 20 MG tablet Take 20 mg by mouth at bedtime. 03/01/13  Yes Historical Provider, MD  rOPINIRole (REQUIP) 2 MG tablet Take 2 mg by mouth every evening.    Yes Historical Provider, MD  spironolactone (ALDACTONE) 25 MG tablet Take 25 mg by mouth daily. 02/15/13  Yes Historical Provider, MD  traMADol (ULTRAM) 50 MG tablet Take 50 mg by mouth 4 (four) times daily as needed for pain.    Yes Historical Provider, MD  traZODone (DESYREL) 50 MG tablet Take 50-100 mg by mouth at bedtime. 1-2 tablets depending on severity of RLS   Yes Historical Provider, MD   Physical Exam: Filed Vitals:   03/16/13 1200  BP: 113/60  Pulse: 73  Temp:   Resp: 11     General:  Obese sitting on side of bed no acute distress  Eyes: Pupils are equal round reactive to light. EOMI. No scleral icterus  ENT: Her ears are clear nose without drainage. Oropharynx is without erythema or exudate. Mucous membranes of her mouth are pink slightly dry  Neck: Supple with no JVD. There is no lymphadenopathy. Full range of motion  Cardiovascular: S1 and S2. I hear no murmur gallop or rub. There is trace lower extremity edema on the left. Right lower limb is prosthetic  Respiratory: Normal effort breath sounds are clear to auscultation bilaterally. Appreciate no wheeze no rhonchi  Abdomen: Obese soft positive bowel sounds throughout. Her abdomen is nontender to palpation. There is no guarding or rebound  Skin:  Skin is warm and dry. I see no rashes or lesions  Musculoskeletal: Right lower prosthetic. Otherwise joints are without erythema or swelling and nontender to palpation. There is no clubbing or cyanosis  Psychiatric: Calm cooperative  Neurologic: Patient is oriented to self and place only. Cranial nerves II through XII are grossly intact. Her speech is slow but clear. She clearly has short-term memory deficit. Able to follow commands but responds somewhat slowly.  Labs on Admission:  Basic Metabolic  Panel:  Recent Labs Lab 03/16/13 1001  NA 140  K 3.9  CL 103  CO2 27  GLUCOSE 81  BUN 31*  CREATININE 1.40*  CALCIUM 8.8   Liver Function Tests:  Recent Labs Lab 03/16/13 1001  AST 13  ALT 11  ALKPHOS 93  BILITOT 0.2*  PROT 6.7  ALBUMIN 3.0*   No results found for this basename: LIPASE, AMYLASE,  in the last 168 hours No results found for this basename: AMMONIA,  in the last 168 hours CBC:  Recent Labs Lab 03/16/13 1001  WBC 7.6  NEUTROABS 4.8  HGB 10.8*  HCT 32.4*  MCV 83.7  PLT 276   Cardiac Enzymes:  Recent Labs Lab 03/16/13 1001  TROPONINI <0.30    BNP (last 3 results) No results found for this basename: PROBNP,  in the last 8760 hours CBG:  Recent Labs Lab 03/16/13 0841 03/16/13 1009 03/16/13 1052 03/16/13 1251  GLUCAP 142* 66* 67* 146*    Radiological Exams on Admission: Dg Chest 2 View  03/16/2013   CLINICAL DATA:  Altered mental status  EXAM: CHEST  2 VIEW  COMPARISON:  Oct 09, 2012  FINDINGS: There is subsegmental atelectasis in the left base. There is no edema or consolidation. Heart size and pulmonary vascularity are normal. No adenopathy. There is mild elevation of the right hemidiaphragm.  IMPRESSION: No edema or consolidation.   Electronically Signed   By: Lowella Grip M.D.   On: 03/16/2013 11:09   Ct Head Wo Contrast  03/16/2013   CLINICAL DATA:  Altered mental status.  EXAM: CT HEAD WITHOUT CONTRAST  TECHNIQUE: Contiguous axial images were obtained from the base of the skull through the vertex without intravenous contrast.  COMPARISON:  01/24/2012  FINDINGS: The examination is limited by patient motion. No mass lesion. No midline shift. No acute hemorrhage or hematoma. No extra-axial fluid collections. No evidence of acute infarction. Hypodensity is identified in the anterior limb of the right internal capsule flow. This is seen on the 01/24/2012 examination, and may represent an old infarct. The calvarium is intact. The  visualized paranasal sinuses and mastoid air cells are clear. Orbital soft tissues are unremarkable.  IMPRESSION: No acute intracranial abnormality, with limitations as above.   Electronically Signed   By: Donavan Burnet M.D.   On: 03/16/2013 10:49    EKG: Independently reviewed. Pending  Assessment/Plan Principal Problem:   Acute encephalopathy: Etiology unclear however is a concern for TIA [name] history of slurred speech and weak grip. Also of concern is episode of hypoglycemia [name]  that the patient took 35 units of insulin this morning not knowing what her blood sugar was  and ate only a biscuit since that time. Will admit to telemetry. Will initiate TIA workup that includes MRI/MRA of the head, 2-D echo, carotid Dopplers. Will check a lipid panel, hemoglobin A1c, TSH. In addition will check CBGs 4 times a day and use sliding scale insulin for optimal glycemic control. Will continue aspirin and statin. We'll request PT and OT evaluation as well.  Active Problems:   Acute on chronic renal failure: Etiology uncertain. Chart review indicates baseline 1.1 1.2. Currently creatinine is 1.4. Will gently hydrate. Will hold any nephrotoxins. Will recheck in the a.m.  Hypoglycemia: Etiology uncertain but suspect do to medications. patient reports injecting 35 units of insulin this morning without checking her blood sugar first. She then made a biscuit on the way to her physical therapy appointment. Will check a hemoglobin A1c. We'll check her CBG 4 times a day and use sliding scale insulin as indicated. All night use at bedtime coverage.  Anemia: likely related to chronic disease. Chart review indicates current level at baseline. No obvious s/sx bleeding. Monitor  Hypertension: home medications include lopressor, aldactone. Will hold these for now. SBP range 113-140. HR range 71-74. Will monitor closeley.    Morbid obesity: BMI 35.8. Nutritional consultation for diet education weight loss.     Anxiety: appears at baseline.     Diabetes mellitus with neuropathy: See #1 and #2. Pt on ultram at home. Will hold for now due to  #1.     OSA (obstructive sleep apnea): does not wear CPAP at home      Hyperlipidemia: continue statin. Will check lipid panel.    S/P bilateral BKA (below knee amputation): with prosthesis. Uses walker. Participating in OP PT to be able to use cane only. Will request PT/OT eval.    Code Status: full Family Communication: husband at bedside Disposition Plan: home when ready  Time spent: 35 minutes  [facility] Pager [phone]  If 7PM-7AM, please contact night-coverage [email or web address] Password [email or web address] 03/16/2013, 1:11 PM

## 2013-03-16 NOTE — ED Notes (Signed)
Spouse states patient frequently forgets events and sometimes it is worse than others

## 2013-03-16 NOTE — ED Notes (Addendum)
Became confused while in rehab. Recent bka on the right side

## 2013-03-16 NOTE — Progress Notes (Signed)
Physical Therapy Treatment Patient Details  Name: Mary [name] MRN: [id number] Date of Birth: July 26, 1945  Today's Date: 03/16/2013 Time: 805 - 828  charge :  Self care, [phone]  Visit#: 55 of 24  Re-eval: 03/12/13    Authorization: [name]     Authorization Visit#: 23 of 30   Subjective: Symptoms/Limitations Symptoms: Pt walked into department with prothesis and walker I .  Pt states her goal is to be able to walk with a cane. Pain Assessment Currently in Pain?: No/denies    Exercise/Treatments Pt educated on donning/doffing prothesis as well as adding socks to prothesis to prevent excessive WB on distal end.       Physical Therapy Assessment and Plan PT Assessment and Plan Clinical Impression Statement: PT leg was red and did not subside until 15 minutes after prothesis was removed.  Family was educated that it will be very important to keep adding socks even throughout the day as wearing the prothesis will shrink her leg significantly.  Pt and husband both verbalize understanding.  Pt wound has decreased to .3 x .2 x .1 we will discharge her wound care and work on gt with cane/balance only.  Pt verbalized that she was feeling very nauseated and light headed pt laid beck down took vitals of 151/71 with HR 85 and O2 96; Pt continued to feel bad called nurse to take blood surgar which was 142; Pt continued to feel bad therfore rapid response was called and Pt was taken to the ER .  Reeval for waking;mm test not completed. PT Treatment/Interventions: DME instruction;Gait training;Functional mobility training;Therapeutic exercise;Balance training;Patient/family education;Other (comment) PT Plan: discharge wound care; may decrease to twice a week for gt and balance if pt/family agreeable.  Pt need mm test as well as balance and gt testing for re-assessment.      Goals  Progressing  Problem List Patient Active Problem List   Diagnosis Date Noted  . Unstable  balance 01/16/2013  . Difficulty in walking(719.7) 01/16/2013  . Infected wound 01/16/2013  . Hx of right BKA 12/23/2012  . Candidiasis of breast 12/08/2012  . S/P bilateral BKA (below knee amputation) 11/30/2012  . Diabetes 11/30/2012  . Diabetic foot ulcer 10/08/2012  . UTI (lower urinary tract infection) 10/07/2012  . Bacteremia due to Staphylococcus aureus 10/07/2012  . Anxiety 02/17/2012  . Osteomyelitis of right foot 02/14/2012  . Hematuria, microscopic 02/13/2012  . Hyponatremia 02/13/2012  . PVD (peripheral vascular disease) 02/08/2012  . Open wound of heel 02/08/2012  . Hyperlipidemia 11/11/2011  . Hypertension 11/11/2011  . Morbid obesity 11/11/2011  . Tachycardia 08/04/2011  . Acute exacerbation of CHF (congestive heart failure) 07/25/2011  . Diabetes mellitus with neuropathy 07/25/2011  . OSA (obstructive sleep apnea) 07/25/2011  . Acute bronchitis 07/25/2011  . Generalized weakness 07/25/2011  . Acute respiratory failure 07/25/2011  . Restless leg syndrome 07/25/2011  . Anemia 07/25/2011       GP    RUSSELL,CINDY 03/16/2013, 10:32 AM

## 2013-03-16 NOTE — H&P (Signed)
The patient was seen and examined. She was discussed with nurse practitioner, Ms. Renard Hamper. Per my exam, there are no clear neurological deficits, although, it was mentioned that she had difficulty remembering specifics about her past medical history and her medications. This afternoon, she to have a better memory. Her confusion was likely secondary to relative hypoglycemia. MRI of her brain reveals no acute stroke. MRA of the brain results are pending. Carotid ultrasound reveals less than 50% stenosis bilaterally. 2-D echocardiogram results pending. Or liberalize her diet to a regular diet instead of a carbohydrate modified. Sensitive scale NovoLog has been started, with caution. Continue aspirin and statin. Restart metoprolol. Will check her TSH.  Of note, if the remainder of the patient's diagnostic workup remains unremarkable, she can be discharged tomorrow with a decrease in her insulin dosing. The patient was also instructed to check her blood sugars prior to taking insulin consistently.

## 2013-03-16 NOTE — Progress Notes (Signed)
*  PRELIMINARY RESULTS* Echocardiogram 2D Echocardiogram has been performed.  [place], [place] 03/16/2013, 5:44 PM

## 2013-03-17 DIAGNOSIS — E119 Type 2 diabetes mellitus without complications: Secondary | ICD-10-CM

## 2013-03-17 DIAGNOSIS — E785 Hyperlipidemia, unspecified: Secondary | ICD-10-CM

## 2013-03-17 LAB — BASIC METABOLIC PANEL
CO2: 26 mEq/L (ref 19–32)
Calcium: 8.7 mg/dL (ref 8.4–10.5)
Chloride: 102 mEq/L (ref 96–112)
Glucose, Bld: 240 mg/dL — ABNORMAL HIGH (ref 70–99)
Potassium: 4 mEq/L (ref 3.5–5.1)
Sodium: 137 mEq/L (ref 135–145)

## 2013-03-17 LAB — CBC
HCT: 30.8 % — ABNORMAL LOW (ref 36.0–46.0)
Hemoglobin: 10.4 g/dL — ABNORMAL LOW (ref 12.0–15.0)
MCH: 28.1 pg (ref 26.0–34.0)
MCHC: 33.8 g/dL (ref 30.0–36.0)
MCV: 83.2 fL (ref 78.0–100.0)
Platelets: 256 10*3/uL (ref 150–400)
RBC: 3.7 MIL/uL — ABNORMAL LOW (ref 3.87–5.11)
WBC: 6.7 10*3/uL (ref 4.0–10.5)

## 2013-03-17 LAB — GLUCOSE, CAPILLARY: Glucose-Capillary: 225 mg/dL — ABNORMAL HIGH (ref 70–99)

## 2013-03-17 MED ORDER — ASPIRIN EC 81 MG PO TBEC: 81.0000 mg | DELAYED_RELEASE_TABLET | Freq: Every day | ORAL | Status: DC

## 2013-03-17 MED ORDER — INSULIN LISPRO PROT & LISPRO (50-50 MIX) 100 UNIT/ML ~~LOC~~ SUSP: SUBCUTANEOUS | Status: DC

## 2013-03-17 NOTE — Discharge Summary (Signed)
Physician Discharge Summary  Mary [name] [id number] DOB: 1946-04-20 DOA: 03/16/2013  PCP: Delphina Cahill, MD  Admit date: 03/16/2013 Discharge date: 03/17/2013  Time spent: 35 minutes  Recommendations for Outpatient Follow-up:  1. Follow up with pcp in 1 week 2. Would recommend outpatient neurology referral to review internal carotid artery stenosis, suspect medical management would be advised  Discharge Diagnoses:  Principal Problem:   Acute encephalopathy Active Problems:   Diabetes mellitus with neuropathy   OSA (obstructive sleep apnea)   Anemia   Hyperlipidemia   Hypertension   Morbid obesity   Anxiety   S/P bilateral BKA (below knee amputation)   Acute on chronic renal failure   Hypoglycemia   Discharge Condition: improved  Diet recommendation: low salt, low carb  Filed Weights   03/16/13 0931 03/16/13 1456  Weight: 116.121 kg (256 lb) 116.121 kg (256 lb)    History of present illness:  Mary [name] is a 67 y.o. female with past medical history that includes diabetes, obesity, hyperlipidemia, hypertension, anxiety, as as to the emergency department from physical therapy clinic with the chief complaint of altered mental status. Information is obtained from the patient and the husband however information from the patient is unreliable do to obvious memory impairment. Patient was at outpatient physical therapy clinic when she suddenly began to feel very nauseated and lightheaded vitals at that time were stable. She lay down and continued to feel bad so rapid response nurse was called. CBG was noted to be 142 at this time. Husband reports at this time she was confused but able to follow commands. And has no memory of the episode. Husband reports patient has been experiencing intermittent episodes of "irritability" over the last several months. In addition he has noticed 3 episodes of brief slurred speech over the last 12 months. Patient reports intermittent weakness  In bilateral grip particularly on the left. She does have a history of diabetic neuropathy as well. Husband also reports patient suffered a fall approximately 5 months ago and hit her head. In addition patient reports not checking her blood sugar with any regularity and yet continues to inject herself with 30 units of insulin in the morning and 30 5 in the evening. Husband reports that this morning patient gave herself insulin and they stopped for biscuit on the way to the physical therapy appointment. He denies noticing any confusion or slurred speech or weakness this morning as patient was preparing for her appointment. There has been no recent illness, no fever no chills no sick contacts. Patient denies headaches visual disturbances numbness or tingling of extremities. She denies chest pain palpitation shortness of breath. She denies abdominal pain diarrhea constipation dysuria hematuria frequency or urgency. She denies melena or hematochezia. She states that she recently started seeing Dr. Dorris Fetch for her diabetes and her most recent hemoglobin A1c in August of this year was between 8 and 9. In the emergency department she was found to have a glucose of 67 and was given an amp of D50. Lab work significant for hemoglobin of 10.8 creatinine of 1.4. Urine drug screen was negative as well. CT of the head with no acute intracranial abnormality. Chest x-ray yields no edema or consolidation. Vital signs are unremarkable while in the emergency department. MRI is pending at the time of my exam.  Symptoms came on suddenly have persisted characterized as moderate. We are asked to admit for further evaluation and treatment   Hospital Course:  This patient was admitted to the hospital with  transient dysathria and difficulty with memory.  She  Was found to be hypoglycemic on arrival.  Her insulin was held and this promptly improved.  She was admitted to the hospital for further management. She underwent TIA work up  including MRI brain which was negative for infarct.  Carotid dopplers did not show any evidence of significant stenosis bilaterally.  MRA of the brain did indicate left internal carotid artery disease at carotid siphon.  Patient will need to be optimized medically including daily aspirin, statin, diabetes and blood pressure control.  She has been recommended to follow up with neurology as an outpatient to further discuss this stenosis.  Echo did not show any evidence of thrombus.  Her blood sugars have improved and she is feeling back to baseline.  She is ambulating without difficulty and is requesting discharge home.  We have decreased her insulin dosing and have asked her to check her blood sugars closely.  Follow up with endocrinology as needed.  Procedures: Echo: - Left ventricle: The cavity size was normal. There was mild to moderate concentric hypertrophy. Systolic function was normal. The estimated ejection fraction was in the range of 55% to 60%. Although no diagnostic regional wall motion abnormality was identified, this possibility cannot be completely excluded on the basis of this study. Doppler parameters are consistent with abnormal left ventricular relaxation (grade 1 diastolic dysfunction). - Mitral valve: Trivial regurgitation. - Left atrium: The atrium was mildly dilated. - Right atrium: Central venous pressure: 90mm Hg (est). - Atrial septum: Thickened septum, appears to bow from left to right based on limited views - somewhat aneurysmal. No defect or patent foramen ovale was identified. - Tricuspid valve: Trivial regurgitation. - Pulmonary arteries: PA peak pressure: 80mm Hg (S). - Pericardium, extracardiac: A trivial pericardial effusion was identified circumferential to the heart. Impressions:  - Comparison to prior study May 2014. Mild to moderate LVH with LVEF 0000000, grade 1 diastolic dysfunction. Mild left atrial enlargement. Normal RV size. Trivial  tricuspid regurgitation with PASP 26 mmHg. Trivial circumferential pericardial effusion. Atrial septum bows from left to right, somewhat aneurysmal in appearance, but based on limited views there is no PFO or ASD.    Consultations:  none  Discharge Exam: Filed Vitals:   03/17/13 1024  BP: 152/82  Pulse: 73  Temp:   Resp: 18    General: NAD Cardiovascular: S1, s2 RRR Respiratory: CTA B  Discharge Instructions  Discharge Orders   Future Appointments Provider Department Dept Phone   03/19/2013 3:15 PM Satira Sark [facility] [place] [id number]   03/21/2013 8:00 AM Reyne Dumas, PTA [place] [facility] [phone]   03/23/2013 1:00 PM Leeroy Cha, PT [facility] [phone]   03/26/2013 8:00 AM Eugene Garnet Denita Lung, PTA [place] [facility] [phone]   03/28/2013 8:00 AM Reyne Dumas, PTA [facility] [phone]   03/30/2013 9:30 AM Leeroy Cha, PT [place] [facility] [phone]   Future Orders Complete By Expires   Call MD for:  persistant dizziness or light-headedness  As directed    Call MD for:  As directed    Comments:     Weakness on either side of body, difficulty with speech   Diet - low sodium heart healthy  As directed    Diet Carb Modified  As directed    Increase activity slowly  As directed        Medication List  aspirin EC 81 MG tablet  Take 1 tablet (81 mg total) by mouth daily.     cholecalciferol 1000 UNITS tablet  Commonly known as:  VITAMIN D  Take 1,000 Units by mouth 2 (two) times daily.     FARXIGA 5 MG Tabs  Generic drug:  Dapagliflozin Propanediol  Take 5 mg by mouth daily.     ferrous sulfate 325 (65 FE) MG tablet  Take 125 mg by mouth every morning. 5 tablets taken by mouth every day     insulin lispro protamine-lispro (50-50) 100 UNIT/ML Susp injection  Commonly known  as:  HUMALOG 50/50  Take 20 units in am and 25 units in pm     metoprolol 50 MG tablet  Commonly known as:  LOPRESSOR  Take 1 tablet (50 mg total) by mouth 2 (two) times daily.     multivitamin with minerals Tabs tablet  Take 1 tablet by mouth once a week.     pramipexole 0.5 MG tablet  Commonly known as:  MIRAPEX  Take 0.5-1 mg by mouth 3 (three) times daily with meals. 1 tablet with breakfast and lunch; 2 tablets with supper     pravastatin 20 MG tablet  Commonly known as:  PRAVACHOL  Take 20 mg by mouth at bedtime.     rOPINIRole 2 MG tablet  Commonly known as:  REQUIP  Take 2 mg by mouth every evening.     spironolactone 25 MG tablet  Commonly known as:  ALDACTONE  Take 25 mg by mouth daily.     traMADol 50 MG tablet  Commonly known as:  ULTRAM  Take 50 mg by mouth 4 (four) times daily as needed for pain.     traZODone 50 MG tablet  Commonly known as:  DESYREL  Take 50-100 mg by mouth at bedtime. 1-2 tablets depending on severity of RLS       No Known Allergies     Follow-up Information   Follow up with Delphina Cahill, MD. Schedule an appointment as soon as possible for a visit in 1 week. (need referral to Neurology as an outpatient)    Specialty:  Internal Medicine   Contact information:    [place] [address] [phone]        The results of significant diagnostics from this hospitalization (including imaging, microbiology, ancillary and laboratory) are listed below for reference.    Significant Diagnostic Studies: Dg Chest 2 View  03/16/2013   CLINICAL DATA:  Altered mental status  EXAM: CHEST  2 VIEW  COMPARISON:  Oct 09, 2012  FINDINGS: There is subsegmental atelectasis in the left base. There is no edema or consolidation. Heart size and pulmonary vascularity are normal. No adenopathy. There is mild elevation of the right hemidiaphragm.  IMPRESSION: No edema or consolidation.   Electronically Signed   By: Lowella Grip M.D.   On:  03/16/2013 11:09   Ct Head Wo Contrast  03/16/2013   CLINICAL DATA:  Altered mental status.  EXAM: CT HEAD WITHOUT CONTRAST  TECHNIQUE: Contiguous axial images were obtained from the base of the skull through the vertex without intravenous contrast.  COMPARISON:  01/24/2012  FINDINGS: The examination is limited by patient motion. No mass lesion. No midline shift. No acute hemorrhage or hematoma. No extra-axial fluid collections. No evidence of acute infarction. Hypodensity is identified in the anterior limb of the right internal capsule flow. This is seen on the 01/24/2012 examination, and may represent an old  infarct. The calvarium is intact. The visualized paranasal sinuses and mastoid air cells are clear. Orbital soft tissues are unremarkable.  IMPRESSION: No acute intracranial abnormality, with limitations as above.   Electronically Signed   By: Donavan Burnet M.D.   On: 03/16/2013 10:49   Mr [name] Wo Contrast  03/16/2013   CLINICAL DATA:  67 year old female with confusion, memory loss, off balance.  EXAM: MRA HEAD WITHOUT CONTRAST  TECHNIQUE: MRA HEAD WITHOUT CONTRAST  COMPARISON:  Brain MRI 03/16/2013.  FINDINGS: Study is mildly degraded by motion artifact despite repeated imaging attempts.  Antegrade flow in the posterior circulation. Distal left vertebral artery appears dominant, the right functionally terminates in PICA. Normal left PICA. Patent vertebrobasilar junction and basilar artery without stenosis. SCA and right PCA origin are normal. Fetal type left PCA origin. Bilateral PCA branches are within normal limits. Right posterior communicating artery is present.  Antegrade flow in both ICA siphons. Moderate to severe ICA siphon irregularity. Evidence of hemodynamically significant stenosis of the left ICA at the anterior genu (series 105, image 11). No hemodynamically significant right ICA stenosis.  Patent carotid termini, the left is hypoplastic owing to hypoplasia or absence of the  left ACA A1 segment. Anterior communicating artery and visualized ACA branches are within normal limits. Visualized right MCA branches are within normal limits.  On the left there is mild irregularity of the proximal M1 segment. No hemodynamically significant left MCA stenosis and other visualized left MCA branches are within normal limits.  IMPRESSION: 1. Evidence of hemodynamically significant atherosclerotic stenosis of the left ICA siphon at the anterior genu.  2. Otherwise mild anterior circulation atherosclerosis with no additional stenosis and no major branch occlusion.  3.  Negative posterior circulation.   Electronically Signed   By: Lars Pinks M.D.   On: 03/16/2013 18:28   Mr Brain Wo Contrast  03/16/2013   CLINICAL DATA:  Altered mental status  EXAM: MRI HEAD WITHOUT CONTRAST  TECHNIQUE: Multiplanar, multisequence MR imaging was performed. No intravenous contrast was administered.  COMPARISON:  CT 03/16/2013  FINDINGS: Image quality degraded by due to motion.  Ventricle size is normal. Patchy hyperintensity in the cerebral white matter bilaterally compatible with chronic microvascular ischemia. Brainstem and cerebellum are intact. No cortical infarct.  Negative for acute infarct. Negative for hemorrhage or mass lesion.  Mild bilateral mastoid sinus effusion. Paranasal sinuses are clear. Vessels at the base of brain are patent.  IMPRESSION: Mild chronic microvascular ischemic change. No acute abnormality.   Electronically Signed   By: Franchot Gallo M.D.   On: 03/16/2013 14:34   US Carotid Duplex Bilateral  03/16/2013   CLINICAL DATA:  TIA  EXAM: BILATERAL CAROTID DUPLEX ULTRASOUND  TECHNIQUE: Pearline Cables scale imaging, color Doppler and duplex ultrasound were performed of bilateral carotid and vertebral arteries in the neck.  COMPARISON:  None.  FINDINGS: Criteria: Quantification of carotid stenosis is based on velocity parameters that correlate the residual internal carotid diameter with NASCET-based  stenosis levels, using the diameter of the distal internal carotid lumen as the denominator for stenosis measurement.  The following velocity measurements were obtained:  RIGHT  ICA:  91 cm/sec  CCA:  77 cm/sec  SYSTOLIC ICA/CCA RATIO:  [id number]  DIASTOLIC ICA/CCA RATIO:  ECA:  111 cm/sec  LEFT  ICA:  83 cm/sec  CCA:  [id number] cm/sec  SYSTOLIC ICA/CCA RATIO:  [id number]  DIASTOLIC ICA/CCA RATIO:  ECA:  97 cm/sec  RIGHT CAROTID ARTERY: Mild plaque in the bulb. Low resistance  internal carotid Doppler pattern.  RIGHT VERTEBRAL ARTERY:  Antegrade. Low resistance Doppler pattern.  LEFT CAROTID ARTERY: Minimal smooth plaque in the bulb. Low resistance internal carotid Doppler pattern.  LEFT VERTEBRAL ARTERY:  Antegrade. Normal-appearing Doppler pattern.  IMPRESSION: Less than 50% stenosis in the right and left internal carotid arteries. There is plaque bilaterally as described above.   Electronically Signed   By: Maryclare Bean M.D.   On: 03/16/2013 17:49    Microbiology: No results found for this or any previous visit (from the past 240 hour(s)).   Labs: Basic Metabolic Panel:  Recent Labs Lab 03/16/13 1001 03/16/13 1530 03/17/13 0557  NA 140  --  137  K 3.9  --  4.0  CL 103  --  102  CO2 27  --  26  GLUCOSE 81  --  240*  BUN 31*  --  26*  CREATININE 1.40* 1.40* 1.30*  CALCIUM 8.8  --  8.7   Liver Function Tests:  Recent Labs Lab 03/16/13 1001  AST 13  ALT 11  ALKPHOS 93  BILITOT 0.2*  PROT 6.7  ALBUMIN 3.0*   No results found for this basename: LIPASE, AMYLASE,  in the last 168 hours No results found for this basename: AMMONIA,  in the last 168 hours CBC:  Recent Labs Lab 03/16/13 1001 03/17/13 0557  WBC 7.6 6.7  NEUTROABS 4.8  --   HGB 10.8* 10.4*  HCT 32.4* 30.8*  MCV 83.7 83.2  PLT 276 256   Cardiac Enzymes:  Recent Labs Lab 03/16/13 1001 03/16/13 1530 03/16/13 2042  TROPONINI <0.30 <0.30 <0.30   BNP: BNP (last 3 results) No results found for this basename: PROBNP,  in the  last 8760 hours CBG:  Recent Labs Lab 03/16/13 1251 03/16/13 1431 03/16/13 1712 03/16/13 2103 03/17/13 0724  GLUCAP 146* 100* 96 216* 225*       Signed:  MEMON,JEHANZEB  [facility] 03/17/2013, 10:50 AM

## 2013-03-17 NOTE — Progress Notes (Signed)
Patient was given discharge instructions along with follow up appointments and prescriptions. Patient verbalized understanding of all instructions. Patient was escorted by staff via wheelchair to vehicle. Patient was discharged to home in stable condition.

## 2013-03-19 ENCOUNTER — Ambulatory Visit (HOSPITAL_COMMUNITY): Payer: Medicare Other

## 2013-03-19 ENCOUNTER — Ambulatory Visit (HOSPITAL_COMMUNITY)
Admission: RE | Admit: 2013-03-19 | Discharge: 2013-03-19 | Disposition: A | Discharge status: Home / Self Care | Payer: Medicare Other | Source: Ambulatory Visit | Attending: Internal Medicine | Admitting: Internal Medicine

## 2013-03-19 DIAGNOSIS — IMO0001 Reserved for inherently not codable concepts without codable children: Secondary | ICD-10-CM | POA: Diagnosis not present

## 2013-03-19 NOTE — Evaluation (Addendum)
Physical Therapy Re-evaluation  Patient Details  Name: Mary [name] MRN: [id number] Date of Birth: Feb 24, 1946  Today's Date: 03/19/2013 Time: [id number] PT Time Calculation (min): 55 min Charges: MMT x 1 [phone]) PPT x 23' [phone]) Self care x 20' (1550-1610)              Visit#: 24 of 24  Re-eval: 03/12/13 Assessment Diagnosis: R BKA Surgical Date: 11/25/12  Authorization: [employer]    Authorization Visit#: 33 of 30   Past Medical History:  Past Medical History  Diagnosis Date  . CHF (congestive heart failure)   . Diabetes mellitus   . Restless leg syndrome 07/25/2011  . Wound, open     right foot  . Diabetes mellitus with neuropathy 07/25/2011  . Cellulitis and abscess of foot 02/12/2012  . Osteomyelitis of right foot 02/14/2012  . Partial Achilles tendon tear 02/14/2012  . Dysrhythmia     tachy- takes Metoprolol  . OSA (obstructive sleep apnea) 07/25/2011    not using CPAP  . Arthritis     knees  . Anemia    Past Surgical History:  Past Surgical History  Procedure Laterality Date  . Abdominal hysterectomy    . Achilles tendon repair    . Toe amputation      partial rt great toe  . Breast surgery Left     Lumpectomy non ca  . Below knee leg amputation Right 11/25/2012    Dr Sharol Given  . Amputation Right 11/25/2012    Procedure: AMPUTATION BELOW KNEE;  Surgeon: Newt Minion, MD;  Location: [place];  Service: Orthopedics;  Laterality: Right;  Right Below Knee Amputation    Subjective Symptoms/Limitations Symptoms: Pt states that her residual limb is swollen today. Pain Assessment Currently in Pain?: No/denies  Assessment RLE Strength Right Hip Flexion: 5/5 Right Hip Extension: 5/5 Right Hip ABduction: 5/5 Right Hip ADduction: 5/5 Right Knee Flexion: 5/5 Right Knee Extension: 5/5 LLE Strength Left Hip Flexion: 5/5 Left Hip Extension: 5/5 Left Hip ABduction: 5/5 (was 3+/5 (02/12/13)) Left Hip ADduction: 5/5 (was 4/5 (02/12/13)) Left  Knee Flexion: 5/5 Left Knee Extension: 5/5 Left Ankle Dorsiflexion: 5/5 (was 4/5 (01/16/13)) Left Ankle Plantar Flexion: 5/5 (was 3+/5 (01/16/13))   Wound is .2x .3cm able to discharge wound care. Exercise/Treatments Mobility/Balance  Berg Balance Test Sit to Stand: Able to stand  independently using hands Standing Unsupported: Able to stand 2 minutes with supervision Sitting with Back Unsupported but Feet Supported on Floor or Stool: Able to sit safely and securely 2 minutes Stand to Sit: Sits safely with minimal use of hands Transfers: Able to transfer safely, definite need of hands Standing Unsupported with Eyes Closed: Able to stand 10 seconds safely Standing Ubsupported with Feet Together: Able to place feet together independently and stand for 1 minute with supervision From Standing, Reach Forward with Outstretched Arm: Can reach forward >12 cm safely (5") From Standing Position, Pick up Object from Floor: Able to pick up shoe, needs supervision From Standing Position, Turn to Look Behind Over each Shoulder: Looks behind one side only/other side shows less weight shift Turn 360 Degrees: Able to turn 360 degrees safely but slowly Standing Unsupported, Alternately Place Feet on Step/Stool: Needs assistance to keep from falling or unable to try Standing Unsupported, One Foot in Front: Needs help to step but can hold 15 seconds Standing on One Leg: Unable to try or needs assist to prevent fall Total Score: 36    Physical Therapy Assessment and Plan PT  Assessment and Plan Clinical Impression Statement: Pt has progressed well with therapy. Pt is now ambulating with walker and prosthesis on even ground. Pt requires moderate assistance to don and doff prosthesis. Pt able to complete BERG balance test with a score of 36. Pt would benefit from continued skilled PT to improve gait mechanics, balance and functional independence. PT Plan: Continue skilled PT 3x per week for 4 more weeks to  improve balance and gait.    Goals Home Exercise Program Pt/caregiver will Perform Home Exercise Program: For increased strengthening (Bed exercises) PT Short Term Goals Time to Complete Short Term Goals: 2 weeks PT Short Term Goal 1: Wound to be 80% granulated PT Short Term Goal 1 - Progress: Met PT Short Term Goal 2: Pt to be able to ambulate with RW x 50 ft. PT Short Term Goal 2 - Progress: Met PT Short Term Goal 3: Pt to be able to verbalize the importance of keeping her sugar under control in order to keep from becoming an AKA vs. BKA PT Short Term Goal 3 - Progress: Met PT Long Term Goals Time to Complete Long Term Goals: 8 weeks PT Long Term Goal 1: Pt wound to be healed PT Long Term Goal 1 - Progress: Progressing toward goal PT Long Term Goal 2: Pt to be wearing her shrinker daily PT Long Term Goal 2 - Progress: Met Long Term Goal 3: Pt to be able to don and doff prothesis Long Term Goal 3 Progress: Progressing toward goal Long Term Goal 4: Pt to be able to walk I with prothesis with least assistive devise x 300 ft  Long Term Goal 4 Progress: Progressing toward goal PT Long Term Goal 5: Pt to verbalize the importance of inspecting Rt residual limb and Lt feet on a daily basis. Long Term Goal 5 Progress: Met Additional PT Long Term Goals?: Yes PT Long Term Goal 6: Pt to be able to step up and off a curb I with prothesis and least assistive device. Long Term Goal 6 Progress: Progressing toward goal  Problem List Patient Active Problem List   Diagnosis Date Noted  . Acute encephalopathy 03/16/2013  . Acute on chronic renal failure 03/16/2013  . Hypoglycemia 03/16/2013  . CHF (congestive heart failure)   . Unstable balance 01/16/2013  . Difficulty in walking(719.7) 01/16/2013  . Infected wound 01/16/2013  . Hx of right BKA 12/23/2012  . Candidiasis of breast 12/08/2012  . S/P bilateral BKA (below knee amputation) 11/30/2012  . Diabetes 11/30/2012  . Diabetic foot ulcer  10/08/2012  . UTI (lower urinary tract infection) 10/07/2012  . Bacteremia due to Staphylococcus aureus 10/07/2012  . Anxiety 02/17/2012  . Osteomyelitis of right foot 02/14/2012  . Hematuria, microscopic 02/13/2012  . Hyponatremia 02/13/2012  . PVD (peripheral vascular disease) 02/08/2012  . Open wound of heel 02/08/2012  . Hyperlipidemia 11/11/2011  . Hypertension 11/11/2011  . Morbid obesity 11/11/2011  . Tachycardia 08/04/2011  . Acute exacerbation of CHF (congestive heart failure) 07/25/2011  . Diabetes mellitus with neuropathy 07/25/2011  . OSA (obstructive sleep apnea) 07/25/2011  . Acute bronchitis 07/25/2011  . Generalized weakness 07/25/2011  . Acute respiratory failure 07/25/2011  . Restless leg syndrome 07/25/2011  . Anemia 07/25/2011    PT - End of Session Equipment Utilized During Treatment: Gait belt Activity Tolerance: Patient tolerated treatment well General Behavior During Therapy: [facility] for tasks assessed/performed  Rachelle Hora, PTA  03/19/2013, 5:39 PM  Physician Documentation Your signature is required to indicate  approval of the treatment plan as stated above.  Please sign and either send electronically or make a copy of this report for your files and return this physician signed original.   Please mark one 1.__approve of plan  2. ___approve of plan with the following conditions.   ______________________________                                                          _____________________ Physician Signature                                                                                                             Date

## 2013-03-21 ENCOUNTER — Ambulatory Visit (HOSPITAL_COMMUNITY): Payer: Medicare Other

## 2013-03-21 ENCOUNTER — Ambulatory Visit (HOSPITAL_COMMUNITY)
Admission: RE | Admit: 2013-03-21 | Discharge: 2013-03-21 | Disposition: A | Discharge status: Home / Self Care | Payer: Medicare Other | Source: Ambulatory Visit | Attending: Internal Medicine | Admitting: Internal Medicine

## 2013-03-21 DIAGNOSIS — IMO0001 Reserved for inherently not codable concepts without codable children: Secondary | ICD-10-CM | POA: Diagnosis not present

## 2013-03-21 NOTE — Progress Notes (Signed)
Physical Therapy Treatment Patient Details  Name: Mary [name] MRN: [id number] Date of Birth: 18-May-1946  Today's Date: 03/21/2013 Time: 0802-0850 PT Time Calculation (min): 48 min Charges: NMR x 25'(0802-0827) Gait training x [id number])  Visit#: 25 of 28  Re-eval: 03/12/13  Authorization: [employer]  Authorization Visit#: 25 of 30   Subjective: Symptoms/Limitations Symptoms: Pt states that she walked around [facility] for an hour and a half pushing a buggy. Pain Assessment Currently in Pain?: No/denies   Exercise/Treatments Standing Gait Training: Gait training around dept with SPC and CGA Other Standing Knee Exercises: Cone rotation on solide ground 1RT; Standing on RLE with LLE on 4" step Other Standing Knee Exercises: Weight shift A/P R/L x 10 each  Physical Therapy Assessment and Plan PT Assessment and Plan Clinical Impression Statement: Pt continues to progress well. Began gait training with SPC with CGA and cueing for sequencing and posture. PT has most difficulty shifting weight to RLE secondary to weakness and decreased confidence. Pt's activity tolerance for walking and standing is improving. PT Plan: Continue to progress balance and gait per PT POC.    Problem List Patient Active Problem List   Diagnosis Date Noted  . Acute encephalopathy 03/16/2013  . Acute on chronic renal failure 03/16/2013  . Hypoglycemia 03/16/2013  . CHF (congestive heart failure)   . Unstable balance 01/16/2013  . Difficulty in walking(719.7) 01/16/2013  . Infected wound 01/16/2013  . Hx of right BKA 12/23/2012  . Candidiasis of breast 12/08/2012  . S/P bilateral BKA (below knee amputation) 11/30/2012  . Diabetes 11/30/2012  . Diabetic foot ulcer 10/08/2012  . UTI (lower urinary tract infection) 10/07/2012  . Bacteremia due to Staphylococcus aureus 10/07/2012  . Anxiety 02/17/2012  . Osteomyelitis of right foot 02/14/2012  . Hematuria, microscopic  02/13/2012  . Hyponatremia 02/13/2012  . PVD (peripheral vascular disease) 02/08/2012  . Open wound of heel 02/08/2012  . Hyperlipidemia 11/11/2011  . Hypertension 11/11/2011  . Morbid obesity 11/11/2011  . Tachycardia 08/04/2011  . Acute exacerbation of CHF (congestive heart failure) 07/25/2011  . Diabetes mellitus with neuropathy 07/25/2011  . OSA (obstructive sleep apnea) 07/25/2011  . Acute bronchitis 07/25/2011  . Generalized weakness 07/25/2011  . Acute respiratory failure 07/25/2011  . Restless leg syndrome 07/25/2011  . Anemia 07/25/2011    PT - End of Session Equipment Utilized During Treatment: Gait belt Activity Tolerance: Patient tolerated treatment well General Behavior During Therapy: [facility] for tasks assessed/performed  Rachelle Hora, PTA  03/21/2013, 9:04 AM

## 2013-03-23 ENCOUNTER — Ambulatory Visit (HOSPITAL_COMMUNITY): Payer: Medicare Other | Admitting: Physical Therapy

## 2013-03-23 ENCOUNTER — Ambulatory Visit (HOSPITAL_COMMUNITY)
Admission: RE | Admit: 2013-03-23 | Discharge: 2013-03-23 | Disposition: A | Discharge status: Home / Self Care | Payer: Medicare Other | Source: Ambulatory Visit | Attending: Internal Medicine | Admitting: Internal Medicine

## 2013-03-23 DIAGNOSIS — R2689 Other abnormalities of gait and mobility: Secondary | ICD-10-CM

## 2013-03-23 DIAGNOSIS — IMO0001 Reserved for inherently not codable concepts without codable children: Secondary | ICD-10-CM | POA: Diagnosis not present

## 2013-03-23 DIAGNOSIS — R262 Difficulty in walking, not elsewhere classified: Secondary | ICD-10-CM

## 2013-03-23 NOTE — Progress Notes (Signed)
Physical Therapy Treatment Patient Details  Name: Mary [name] MRN: [id number] Date of Birth: Aug 06, 1945  Today's Date: 03/23/2013 Time: 1300-1345 PT Time Calculation (min): 45 min Charge there ex [id number]; neuromuscular [id number] Visit#: 26 of 36  Re-eval: 04/18/13   Authorization Visit#: 26 of 30   Subjective: Symptoms/Limitations Symptoms: Pt states that she had another episode where she could not remember things yesterday.  Precautions/Restrictions     Exercise/Treatments Mobility/Balance        Balance Exercises Standing Tandem Stance:  (Lt slight step in front of left x 3 then Rt in front) Gait with Head Turns (Round Trips): Gt with SPC Marching: 5 reps Other Standing Exercises: stand feet together and rotate. Other Standing Exercises: stand with Rt LE on 4" then Lt on 4" x 3.     Seated Other Seated Exercises: sit to stand x 10    Physical Therapy Assessment and Plan PT Assessment and Plan Clinical Impression Statement: Pt needs verbal encouragement to complete standing balance activites without assistive device.  Pt verbalize increased pain in Lt LE with standing explained that she has not stood without an assistive device in a long time. Rehab Potential: Good PT Frequency: Min 3X/week PT Duration: 8 weeks PT Treatment/Interventions: DME instruction;Gait training;Functional mobility training;Therapeutic exercise;Balance training;Patient/family education;Other (comment) PT Plan: Continue to progress balance and gait per PT POC.    Goals    Problem List Patient Active Problem List   Diagnosis Date Noted  . Acute encephalopathy 03/16/2013  . Acute on chronic renal failure 03/16/2013  . Hypoglycemia 03/16/2013  . CHF (congestive heart failure)   . Unstable balance 01/16/2013  . Difficulty in walking(719.7) 01/16/2013  . Infected wound 01/16/2013  . Hx of right BKA 12/23/2012  . Candidiasis of breast 12/08/2012  . S/P bilateral BKA (below knee  amputation) 11/30/2012  . Diabetes 11/30/2012  . Diabetic foot ulcer 10/08/2012  . UTI (lower urinary tract infection) 10/07/2012  . Bacteremia due to Staphylococcus aureus 10/07/2012  . Anxiety 02/17/2012  . Osteomyelitis of right foot 02/14/2012  . Hematuria, microscopic 02/13/2012  . Hyponatremia 02/13/2012  . PVD (peripheral vascular disease) 02/08/2012  . Open wound of heel 02/08/2012  . Hyperlipidemia 11/11/2011  . Hypertension 11/11/2011  . Morbid obesity 11/11/2011  . Tachycardia 08/04/2011  . Acute exacerbation of CHF (congestive heart failure) 07/25/2011  . Diabetes mellitus with neuropathy 07/25/2011  . OSA (obstructive sleep apnea) 07/25/2011  . Acute bronchitis 07/25/2011  . Generalized weakness 07/25/2011  . Acute respiratory failure 07/25/2011  . Restless leg syndrome 07/25/2011  . Anemia 07/25/2011    PT - End of Session Equipment Utilized During Treatment: Gait belt Activity Tolerance: Patient tolerated treatment well General Behavior During Therapy: [facility] for tasks assessed/performed  GP    Haisley Arens,CINDY 03/23/2013, 1:53 PM

## 2013-03-26 ENCOUNTER — Ambulatory Visit (HOSPITAL_COMMUNITY): Payer: Medicare Other | Admitting: Physical Therapy

## 2013-03-26 ENCOUNTER — Ambulatory Visit (HOSPITAL_COMMUNITY)
Admission: RE | Admit: 2013-03-26 | Discharge: 2013-03-26 | Disposition: A | Discharge status: Home / Self Care | Payer: Medicare Other | Source: Ambulatory Visit | Attending: Internal Medicine | Admitting: Internal Medicine

## 2013-03-26 DIAGNOSIS — IMO0001 Reserved for inherently not codable concepts without codable children: Secondary | ICD-10-CM | POA: Diagnosis not present

## 2013-03-26 NOTE — Progress Notes (Signed)
Physical Therapy Treatment Patient Details  Name: Mary [name] MRN: [id number] Date of Birth: 22-Dec-1945  Today's Date: 03/26/2013 Time: 0810-0910 PT Time Calculation (min): 60 min Charges: Self care x 25'(0810-0835) NMR x [id number])  Visit#: 27 of 60  Re-eval: 04/18/13  Authorization: [employer]  Authorization Time Period:    Authorization Visit#: 27 of 30   Subjective: Symptoms/Limitations Symptoms: Pt states that she is concerned about her residual limb. She noticed redness and small bumps. Pain Assessment Currently in Pain?: No/denies  Exercise/Treatments Balance Exercises Standing Tandem Stance: 2 reps;30 secs;Eyes open Other Standing Exercises: Hip hikes 10 x bilateral  Other Standing Exercises: stand with Rt LE on 4" then Lt on 4" x 3.  Donning/Doffing prosthesis x 1 with max assist from husband  Physical Therapy Assessment and Plan PT Assessment and Plan Clinical Impression Statement: Pt requested that therapist assess residual limb as she is concerned about redness, bumps and changes to remaining open areas. Upon inspection to open 2 wounds noted on medial side of residual limb, these are that same areas that were previously treated at this clinic. Depth noted but no drainage is present. Areas appear clean and are without signs of infection. Advised pt to keep areas clean and covered at all times. Will watch area for any changes. Small red bumps noted on lateral side of residual limb. Advised pt to limit wear-time with prosthesis this week to improve skin integrity. Suggested two hours on/two hours off. Gait training held this session secondary to residual limb irritation. Treatment focus on improving balance. PT Plan: Continue to progress balance and gait per PT POC. Assess skin integrity of residual limb next session.    Problem List Patient Active Problem List   Diagnosis Date Noted  . Acute encephalopathy 03/16/2013  . Acute on chronic renal  failure 03/16/2013  . Hypoglycemia 03/16/2013  . CHF (congestive heart failure)   . Unstable balance 01/16/2013  . Difficulty in walking(719.7) 01/16/2013  . Infected wound 01/16/2013  . Hx of right BKA 12/23/2012  . Candidiasis of breast 12/08/2012  . S/P bilateral BKA (below knee amputation) 11/30/2012  . Diabetes 11/30/2012  . Diabetic foot ulcer 10/08/2012  . UTI (lower urinary tract infection) 10/07/2012  . Bacteremia due to Staphylococcus aureus 10/07/2012  . Anxiety 02/17/2012  . Osteomyelitis of right foot 02/14/2012  . Hematuria, microscopic 02/13/2012  . Hyponatremia 02/13/2012  . PVD (peripheral vascular disease) 02/08/2012  . Open wound of heel 02/08/2012  . Hyperlipidemia 11/11/2011  . Hypertension 11/11/2011  . Morbid obesity 11/11/2011  . Tachycardia 08/04/2011  . Acute exacerbation of CHF (congestive heart failure) 07/25/2011  . Diabetes mellitus with neuropathy 07/25/2011  . OSA (obstructive sleep apnea) 07/25/2011  . Acute bronchitis 07/25/2011  . Generalized weakness 07/25/2011  . Acute respiratory failure 07/25/2011  . Restless leg syndrome 07/25/2011  . Anemia 07/25/2011    PT - End of Session Equipment Utilized During Treatment: Gait belt Activity Tolerance: Patient tolerated treatment well General Behavior During Therapy: [facility] for tasks assessed/performed  Rachelle Hora, PTA  03/26/2013, 11:00 AM

## 2013-03-28 ENCOUNTER — Ambulatory Visit (HOSPITAL_COMMUNITY)
Admission: RE | Admit: 2013-03-28 | Discharge: 2013-03-28 | Disposition: A | Discharge status: Home / Self Care | Payer: Medicare Other | Source: Ambulatory Visit | Attending: Internal Medicine | Admitting: Internal Medicine

## 2013-03-28 ENCOUNTER — Ambulatory Visit (HOSPITAL_COMMUNITY): Payer: Medicare Other

## 2013-03-28 DIAGNOSIS — IMO0001 Reserved for inherently not codable concepts without codable children: Secondary | ICD-10-CM | POA: Diagnosis not present

## 2013-03-28 NOTE — Progress Notes (Signed)
Physical Therapy Treatment Patient Details  Name: Mary [name] MRN: [id number] Date of Birth: 25-Dec-1945  Today's Date: 03/28/2013 Time: 0805-0850 PT Time Calculation (min): 45 min Charges: Self care x 35'  Visit#: 28 of 36  Re-eval: 04/18/13  Authorization: [employer]  Authorization Visit#: 28 of 30   Subjective: Symptoms/Limitations Symptoms:  Pt states that she has been wearing her leg two hours and then leaving it off for two hours for the past two days.  Pain Assessment Currently in Pain?: No/denies   Physical Therapy Assessment and Plan PT Assessment and Plan Clinical Impression Statement: Pt presents with increased redness and irritation throughout residual limb. Spoke with prosthetist who is out of town and cannot see pt until 04/03/13. Advised pt not to wear prosthesis or gel liner until she is seen by prosthetist. Prosthetist agreed with this. Residual limb was wrapped with kerlex and ace bandage to decrease swelling.  PT Plan: Hold PT until pt sees prosthetist.     Problem List Patient Active Problem List   Diagnosis Date Noted  . Acute encephalopathy 03/16/2013  . Acute on chronic renal failure 03/16/2013  . Hypoglycemia 03/16/2013  . CHF (congestive heart failure)   . Unstable balance 01/16/2013  . Difficulty in walking(719.7) 01/16/2013  . Infected wound 01/16/2013  . Hx of right BKA 12/23/2012  . Candidiasis of breast 12/08/2012  . S/P bilateral BKA (below knee amputation) 11/30/2012  . Diabetes 11/30/2012  . Diabetic foot ulcer 10/08/2012  . UTI (lower urinary tract infection) 10/07/2012  . Bacteremia due to Staphylococcus aureus 10/07/2012  . Anxiety 02/17/2012  . Osteomyelitis of right foot 02/14/2012  . Hematuria, microscopic 02/13/2012  . Hyponatremia 02/13/2012  . PVD (peripheral vascular disease) 02/08/2012  . Open wound of heel 02/08/2012  . Hyperlipidemia 11/11/2011  . Hypertension 11/11/2011  . Morbid obesity 11/11/2011   . Tachycardia 08/04/2011  . Acute exacerbation of CHF (congestive heart failure) 07/25/2011  . Diabetes mellitus with neuropathy 07/25/2011  . OSA (obstructive sleep apnea) 07/25/2011  . Acute bronchitis 07/25/2011  . Generalized weakness 07/25/2011  . Acute respiratory failure 07/25/2011  . Restless leg syndrome 07/25/2011  . Anemia 07/25/2011    PT - End of Session Equipment Utilized During Treatment: Gait belt Activity Tolerance: Patient tolerated treatment well General Behavior During Therapy: [facility] for tasks assessed/performed  Rachelle Hora, PTA  03/28/2013, 9:25 AM

## 2013-03-30 ENCOUNTER — Ambulatory Visit (HOSPITAL_COMMUNITY): Payer: Medicare Other | Admitting: Physical Therapy

## 2013-03-30 NOTE — Progress Notes (Signed)
This encounter was created in error - please disregard.  This encounter was created in error - please disregard.

## 2013-04-05 ENCOUNTER — Other Ambulatory Visit: Payer: Self-pay

## 2013-04-05 ENCOUNTER — Ambulatory Visit (HOSPITAL_COMMUNITY)
Admission: RE | Admit: 2013-04-05 | Discharge: 2013-04-05 | Disposition: A | Discharge status: Home / Self Care | Payer: Medicare Other | Source: Ambulatory Visit | Attending: Internal Medicine | Admitting: Internal Medicine

## 2013-04-05 DIAGNOSIS — IMO0001 Reserved for inherently not codable concepts without codable children: Secondary | ICD-10-CM | POA: Insufficient documentation

## 2013-04-05 DIAGNOSIS — R262 Difficulty in walking, not elsewhere classified: Secondary | ICD-10-CM

## 2013-04-05 DIAGNOSIS — R2689 Other abnormalities of gait and mobility: Secondary | ICD-10-CM

## 2013-04-05 DIAGNOSIS — Y849 Medical procedure, unspecified as the cause of abnormal reaction of the patient, or of later complication, without mention of misadventure at the time of the procedure: Secondary | ICD-10-CM | POA: Insufficient documentation

## 2013-04-05 DIAGNOSIS — E119 Type 2 diabetes mellitus without complications: Secondary | ICD-10-CM | POA: Insufficient documentation

## 2013-04-05 DIAGNOSIS — T8189XA Other complications of procedures, not elsewhere classified, initial encounter: Secondary | ICD-10-CM | POA: Insufficient documentation

## 2013-04-05 NOTE — Progress Notes (Signed)
Physical Therapy Treatment Patient Details  Name: Mary [name] MRN: [id number] Date of Birth: 01/25/1946  Today's Date: 04/05/2013 Time: 1022-1108 PT Time Calculation (min): 46 min Charge:  Self care [phone]; there ex 1055-1106 Visit#: 29 of 36  Re-eval: 04/18/13    Authorization: [employer]    Authorization Visit#: 29 of 30   Subjective: Symptoms/Limitations Symptoms: Pt states that the prosthetist believes the irritation of her leg is coming from not having the end of the prothesis flat against her leg when putting it on.  Pt husband requests therapist to look at Lt leg as there is a sore on it that he feels might need to be treated.   Pain Assessment Currently in Pain?: Yes Pain Score: 5  Pain Location: Leg Pain Orientation: Right Pain Relieving Factors: taking prothesis off Effect of Pain on Daily Activities: walking in prothesis   Exercise/Treatments   Standing Other Standing Knee Exercises: sit to stand with lt leg only x 10; balance.      Physical Therapy Assessment and Plan PT Assessment and Plan Clinical Impression Statement: Pt with numerous irritation on RT LE will hold off on using prothesis.  Call into MD regarding therapy on lateral wound on LT foot. PT Plan: continue to stress the importance of lotion, socks and diabetic shoes.     Problem List Patient Active Problem List   Diagnosis Date Noted  . Acute encephalopathy 03/16/2013  . Acute on chronic renal failure 03/16/2013  . Hypoglycemia 03/16/2013  . CHF (congestive heart failure)   . Unstable balance 01/16/2013  . Difficulty in walking(719.7) 01/16/2013  . Infected wound 01/16/2013  . Hx of right BKA 12/23/2012  . Candidiasis of breast 12/08/2012  . S/P bilateral BKA (below knee amputation) 11/30/2012  . Diabetes 11/30/2012  . Diabetic foot ulcer 10/08/2012  . UTI (lower urinary tract infection) 10/07/2012  . Bacteremia due to Staphylococcus aureus 10/07/2012  . Anxiety  02/17/2012  . Osteomyelitis of right foot 02/14/2012  . Hematuria, microscopic 02/13/2012  . Hyponatremia 02/13/2012  . PVD (peripheral vascular disease) 02/08/2012  . Open wound of heel 02/08/2012  . Hyperlipidemia 11/11/2011  . Hypertension 11/11/2011  . Morbid obesity 11/11/2011  . Tachycardia 08/04/2011  . Acute exacerbation of CHF (congestive heart failure) 07/25/2011  . Diabetes mellitus with neuropathy 07/25/2011  . OSA (obstructive sleep apnea) 07/25/2011  . Acute bronchitis 07/25/2011  . Generalized weakness 07/25/2011  . Acute respiratory failure 07/25/2011  . Restless leg syndrome 07/25/2011  . Anemia 07/25/2011    PT - End of Session Equipment Utilized During Treatment: Gait belt Activity Tolerance: Patient tolerated treatment well  GP    Sharona Rovner,CINDY 04/05/2013, 11:50 AM

## 2013-04-10 ENCOUNTER — Ambulatory Visit (HOSPITAL_COMMUNITY)
Admission: RE | Admit: 2013-04-10 | Discharge: 2013-04-10 | Disposition: A | Discharge status: Home / Self Care | Payer: Medicare Other | Source: Ambulatory Visit | Attending: Internal Medicine | Admitting: Internal Medicine

## 2013-04-10 NOTE — Progress Notes (Signed)
Physical Therapy Treatment Patient Details  Name: Mary [name] MRN: [id number] Date of Birth: February 27, 1946  Today's Date: 04/10/2013 Time: [id number] PT Time Calculation (min): 77 min Charges: Selective debridement (= or < 20 cm)(0808-0840) Gait training x [phone]) Therex x [id number]) Self care x 10'(0815-0825)   Visit#: 30 of 36  Re-eval: 04/18/13  Authorization: [employer]   Authorization Visit#: 30 of 30   Subjective: Symptoms/Limitations Symptoms: Pt states that her leg is looking much better since husband is putting gel liner on correctly. Pain Assessment Currently in Pain?: No/denies  Physical Therapy Wound Care     Location: Left lateral foot Length: .7 cm Width: .6 cm Depth: .3 cm Tunneling: none noted Granulation: 5% Slough: 95% Drainage (amount/description): Wound is covered with band-aid but is very dry, no drainage noted.  Periwound: Dry   Dressing Used: Hydrogel Dressing: 2x2 cutin half, ABD, medipore Sharps Used/Location: Forceps scalpel/entire wound Tissue Remove: slough, dry skin    PT. Plan: Selective sharps debridement, Dressing Change, Pt/family Education  Frequency/Duration:  2 x/week for 4 weeks.   Goals: Wound Therapy Goals - Improve the function of patient's integumentary system by progressing the wound(s) through the phases of wound healing by:  Decrease Necrotic Tissue to: 0  Decrease Necrotic Tissue - Progress: Goal set today  Increase Granulation Tissue to: 100  Increase Granulation Tissue - Progress: Goal set today  Decrease Length/Width/Depth to (cm): .3x.2x0 Decrease Length/Width/Depth - Progress: Goal set today  Improve Drainage Characteristics: Min Improve Drainage Characteristics - Progress: Goal set today  Patient/Family will be able to : Care for wound at home. Patient/Family Instruction Goal - Progress: Goal set today  Goals/treatment plan/discharge plan were made with and agreed upon by  patient/family: Yes     Exercise/Treatments Aerobic Stationary Bike: Nustep 10 minutes seat 10 hills #3 level 4 resistance level 4 SPM  for LE strengthening and activity tolerance Standing Gait Training: Gait training around dept with SPC and CGA Other Standing Knee Exercises: Standing on RLE with LLE on 4" without UE assistance 45" x 2 Other Standing Knee Exercises: Weight shift A/P R/L x 10 each; reaching outside BOS  Physical Therapy Assessment and Plan PT Assessment and Plan Clinical Impression Statement: Signed order received for wound care to left foot. Area was cleansed and debrided and dressed with hydrogel. Residual limb appears much healthier. Educated pt on the importance of checking sugar and making healthy choices. Also educated pt on the benefits of wearing socks and diabetic shoes to avoid wounds.  Pt displays improved glute med strength with standing activities and gait training. Pt requires frequent cuing to avoid looking down. PT Plan: Continue wound care to left foot and gait training/LE strengthening. Continue to stress the importance of lotion, socks and diabetic shoes.    Problem List Patient Active Problem List   Diagnosis Date Noted  . Acute encephalopathy 03/16/2013  . Acute on chronic renal failure 03/16/2013  . Hypoglycemia 03/16/2013  . CHF (congestive heart failure)   . Unstable balance 01/16/2013  . Difficulty in walking(719.7) 01/16/2013  . Infected wound 01/16/2013  . Hx of right BKA 12/23/2012  . Candidiasis of breast 12/08/2012  . S/P bilateral BKA (below knee amputation) 11/30/2012  . Diabetes 11/30/2012  . Diabetic foot ulcer 10/08/2012  . UTI (lower urinary tract infection) 10/07/2012  . Bacteremia due to Staphylococcus aureus 10/07/2012  . Anxiety 02/17/2012  . Osteomyelitis of right foot 02/14/2012  . Hematuria, microscopic 02/13/2012  . Hyponatremia 02/13/2012  . PVD (peripheral  vascular disease) 02/08/2012  . Open wound of heel  02/08/2012  . Hyperlipidemia 11/11/2011  . Hypertension 11/11/2011  . Morbid obesity 11/11/2011  . Tachycardia 08/04/2011  . Acute exacerbation of CHF (congestive heart failure) 07/25/2011  . Diabetes mellitus with neuropathy 07/25/2011  . OSA (obstructive sleep apnea) 07/25/2011  . Acute bronchitis 07/25/2011  . Generalized weakness 07/25/2011  . Acute respiratory failure 07/25/2011  . Restless leg syndrome 07/25/2011  . Anemia 07/25/2011    PT - End of Session Equipment Utilized During Treatment: Gait belt Activity Tolerance: Patient tolerated treatment well General Behavior During Therapy: [facility] for tasks assessed/performed  GP Functional Assessment Tool Used: clinical judgement Functional Limitation: Self care Self Care Current Status [id number]): At least 40 percent but less than 60 percent impaired, limited or restricted Self Care Goal Status [id number]): At least 20 percent but less than 40 percent impaired, limited or restricted  Rachelle Hora, PTA  04/10/2013, 9:41 AM

## 2013-04-12 ENCOUNTER — Ambulatory Visit (HOSPITAL_COMMUNITY)
Admission: RE | Admit: 2013-04-12 | Discharge: 2013-04-12 | Disposition: A | Discharge status: Home / Self Care | Payer: Medicare Other | Source: Ambulatory Visit | Attending: Internal Medicine | Admitting: Internal Medicine

## 2013-04-12 NOTE — Progress Notes (Signed)
Physical Therapy Treatment Patient Details  Name: Mary [name] MRN: [id number] Date of Birth: 1945-10-06  Today's Date: 04/12/2013 Time: 0805-0850 PT Time Calculation (min): 45 min Charges: Selective debridement (= or < 20 cm) [id number]) NMR x 15' (0832-0847)   Visit#: 29 of 18  Re-eval: 04/18/13  Authorization: [employer]  Authorization Visit#: 31 of 30   Subjective: Symptoms/Limitations Symptoms: Pt states that she has been checking her sugar more often and writing in down. Pain Assessment Currently in Pain?: No/denies  Physical Therapy Wound Care  Location: Left lateral foot  Length: .7 cm (measured 04/10/13) Width: .6 cm (measured 04/10/13) Depth: .3 cm (measured 04/10/13) Tunneling: none noted  Granulation: 5%  Slough: 95%  Drainage (amount/description): Moderate  Periwound: Appears healthier Dressing Used: Hydrogel;vasoline to periowund Dressing: 2x2 cut in half, ABD, medipore  Sharps Used/Location: Forceps scalpel/entire wound  Tissue Remove: slough, dry skin   Exercise/Treatments  Balance Exercises Standing Other Standing Exercises: Weight shift A/P R/L; Sit to stand x 5 without walker  Other Standing Exercises: SLS with opposide LE on 6" step 1'x 2 bilateral   Physical Therapy Assessment and Plan PT Assessment and Plan Clinical Impression Statement: Left foot wound appears to be progressing well. Skin integrity of residual limb continues to improve. Completed activities to improve proprioceptive control and confidence. PT tolerates tx well and is without complaint of pain. PT Plan: Continue wound care to left foot and gait training/LE strengthening. Continue to stress the importance of lotion, socks and diabetic shoes.     Problem List Patient Active Problem List   Diagnosis Date Noted  . Acute encephalopathy 03/16/2013  . Acute on chronic renal failure 03/16/2013  . Hypoglycemia 03/16/2013  . CHF (congestive heart failure)   .  Unstable balance 01/16/2013  . Difficulty in walking(719.7) 01/16/2013  . Infected wound 01/16/2013  . Hx of right BKA 12/23/2012  . Candidiasis of breast 12/08/2012  . S/P bilateral BKA (below knee amputation) 11/30/2012  . Diabetes 11/30/2012  . Diabetic foot ulcer 10/08/2012  . UTI (lower urinary tract infection) 10/07/2012  . Bacteremia due to Staphylococcus aureus 10/07/2012  . Anxiety 02/17/2012  . Osteomyelitis of right foot 02/14/2012  . Hematuria, microscopic 02/13/2012  . Hyponatremia 02/13/2012  . PVD (peripheral vascular disease) 02/08/2012  . Open wound of heel 02/08/2012  . Hyperlipidemia 11/11/2011  . Hypertension 11/11/2011  . Morbid obesity 11/11/2011  . Tachycardia 08/04/2011  . Acute exacerbation of CHF (congestive heart failure) 07/25/2011  . Diabetes mellitus with neuropathy 07/25/2011  . OSA (obstructive sleep apnea) 07/25/2011  . Acute bronchitis 07/25/2011  . Generalized weakness 07/25/2011  . Acute respiratory failure 07/25/2011  . Restless leg syndrome 07/25/2011  . Anemia 07/25/2011    PT - End of Session Equipment Utilized During Treatment: Gait belt Activity Tolerance: Patient tolerated treatment well General Behavior During Therapy: [facility] for tasks assessed/performed  GP Functional Assessment Tool Used: clinical judgement Functional Limitation: Self care  Rachelle Hora, PTA  04/12/2013, 10:55 AM

## 2013-04-16 ENCOUNTER — Ambulatory Visit (HOSPITAL_COMMUNITY)
Admission: RE | Admit: 2013-04-16 | Discharge: 2013-04-16 | Disposition: A | Discharge status: Home / Self Care | Payer: Medicare Other | Source: Ambulatory Visit | Attending: Internal Medicine | Admitting: Internal Medicine

## 2013-04-16 NOTE — Progress Notes (Signed)
Physical Therapy Treatment Patient Details  Name: Mary [name] MRN: [id number] Date of Birth: 08-28-1945  Today's Date: 04/16/2013 Time: [id number] PT Time Calculation (min): 40 min Charges: Selective debridement (= or < 20 cm) [phone]) NMR x 15' (0830-0845)   Visit#: 4 of 83  Re-eval: 04/18/13   Authorization: [employer]  Authorization Visit#: 32 of 40   Subjective: Symptoms/Limitations Symptoms: Pt states that her sugar was very high this morning and she feels weak, Pain Assessment Currently in Pain?: No/denies  Physical Therapy Wound Care  Location: Left lateral foot  Length: .7 cm (measured 04/10/13)  Width: .6 cm (measured 04/10/13)  Depth: .3 cm (measured 04/10/13)  Tunneling: none noted  Granulation: 30%  Slough: 70%  Drainage (amount/description): Moderate  Periwound: Appears healthier  Dressing Used: Hydrogel;vasoline to periowund  Dressing: Xeroform, Vaseline to periwound, ABD, medipore  Sharps Used/Location: Forceps scalpel/entire wound  Tissue Remove: slough, dry skin  Exercise/Treatments  Balance Exercises Standing Gait with Head Turns (Round Trips): Gt with SPC Other Standing Exercises: Weight shift A/P R/L; Sit to stand x 5 without walker  Other Standing Exercises: SLS with opposide LE on 6" step 1'x 2 bilateral   Physical Therapy Assessment and Plan PT Assessment and Plan Clinical Impression Statement: Let foot wound appears to be approximating well. Changed dressing type to Xeroform. Vaseline applied to periwound to protect skin integrity. NMR activities completed to improve proprioceptive control and functional independence. PT Plan: Continue wound care to left foot and gait training/LE strengthening. Continue to stress the importance of lotion, socks and diabetic shoes.     Problem List Patient Active Problem List   Diagnosis Date Noted  . Acute encephalopathy 03/16/2013  . Acute on chronic renal failure 03/16/2013  .  Hypoglycemia 03/16/2013  . CHF (congestive heart failure)   . Unstable balance 01/16/2013  . Difficulty in walking(719.7) 01/16/2013  . Infected wound 01/16/2013  . Hx of right BKA 12/23/2012  . Candidiasis of breast 12/08/2012  . S/P bilateral BKA (below knee amputation) 11/30/2012  . Diabetes 11/30/2012  . Diabetic foot ulcer 10/08/2012  . UTI (lower urinary tract infection) 10/07/2012  . Bacteremia due to Staphylococcus aureus 10/07/2012  . Anxiety 02/17/2012  . Osteomyelitis of right foot 02/14/2012  . Hematuria, microscopic 02/13/2012  . Hyponatremia 02/13/2012  . PVD (peripheral vascular disease) 02/08/2012  . Open wound of heel 02/08/2012  . Hyperlipidemia 11/11/2011  . Hypertension 11/11/2011  . Morbid obesity 11/11/2011  . Tachycardia 08/04/2011  . Acute exacerbation of CHF (congestive heart failure) 07/25/2011  . Diabetes mellitus with neuropathy 07/25/2011  . OSA (obstructive sleep apnea) 07/25/2011  . Acute bronchitis 07/25/2011  . Generalized weakness 07/25/2011  . Acute respiratory failure 07/25/2011  . Restless leg syndrome 07/25/2011  . Anemia 07/25/2011    PT - End of Session Equipment Utilized During Treatment: Gait belt Activity Tolerance: Patient tolerated treatment well General Behavior During Therapy: [facility] for tasks assessed/performed  Rachelle Hora, PTA 04/16/2013, 11:13 AM

## 2013-04-18 ENCOUNTER — Ambulatory Visit (HOSPITAL_COMMUNITY)
Admission: RE | Admit: 2013-04-18 | Discharge: 2013-04-18 | Disposition: A | Discharge status: Home / Self Care | Payer: Medicare Other | Source: Ambulatory Visit | Attending: Internal Medicine | Admitting: Internal Medicine

## 2013-04-18 NOTE — Evaluation (Addendum)
Physical Therapy Re-evaluation  Patient Details  Name: Mary [name] MRN: [id number] Date of Birth: [birth year]  Today's Date: 04/18/2013 Time: 0805-0915 PT Time Calculation (min): 70 min Charges: Selective debridement (= or < 20 cm) [id number]) PPT x [id number]) Self care x 23' [phone])               Visit#: 84 of 29  Re-eval: 04/18/13 Assessment Diagnosis: R BKA Surgical Date: 11/25/12  Authorization: [employer]    Authorization Visit#: 55 of 57   Past Medical History:  Past Medical History  Diagnosis Date  . CHF (congestive heart failure)   . Diabetes mellitus   . Restless leg syndrome 07/25/2011  . Wound, open     right foot  . Diabetes mellitus with neuropathy 07/25/2011  . Cellulitis and abscess of foot 02/12/2012  . Osteomyelitis of right foot 02/14/2012  . Partial Achilles tendon tear 02/14/2012  . Dysrhythmia     tachy- takes Metoprolol  . OSA (obstructive sleep apnea) 07/25/2011    not using CPAP  . Arthritis     knees  . Anemia    Past Surgical History:  Past Surgical History  Procedure Laterality Date  . Abdominal hysterectomy    . Achilles tendon repair    . Toe amputation      partial rt great toe  . Breast surgery Left     Lumpectomy non ca  . Below knee leg amputation Right 11/25/2012    Dr Sharol Given  . Amputation Right 11/25/2012    Procedure: AMPUTATION BELOW KNEE;  Surgeon: Newt Minion, MD;  Location: [place];  Service: Orthopedics;  Laterality: Right;  Right Below Knee Amputation    Subjective Symptoms/Limitations Symptoms: Pt states taht she wants to get better at walking with a cane. Pain Assessment Currently in Pain?: No/denies  Assessment BLE strength remain 5/5 throughout  Exercise/Treatments Mobility/Balance  Berg Balance Test Sit to Stand: Able to stand  independently using hands Standing Unsupported: Able to stand safely 2 minutes Sitting with Back Unsupported but Feet Supported on Floor or Stool: Able to sit  safely and securely 2 minutes Stand to Sit: Sits safely with minimal use of hands Transfers: Able to transfer safely, minor use of hands Standing Unsupported with Eyes Closed: Able to stand 10 seconds safely Standing Ubsupported with Feet Together: Able to place feet together independently and stand for 1 minute with supervision From Standing, Reach Forward with Outstretched Arm: Can reach forward >12 cm safely (5") From Standing Position, Pick up Object from Floor: Able to pick up shoe safely and easily From Standing Position, Turn to Look Behind Over each Shoulder: Looks behind from both sides and weight shifts well Turn 360 Degrees: Able to turn 360 degrees safely but slowly Standing Unsupported, Alternately Place Feet on Step/Stool: Needs assistance to keep from falling or unable to try Standing Unsupported, One Foot in Front: Able to take small step independently and hold 30 seconds Standing on One Leg: Unable to try or needs assist to prevent fall Total Score: 41 (was 36 03/19/13)   Physical Therapy Wound Care  Location: Left lateral foot  Length: .4 cm (meausured .7 cm  04/10/13)  Width: .4 cm (measured .6 cm 04/10/13)  Depth: .3 cm (measured .3 cm 04/10/13)  Tunneling: none noted  Granulation: 30%  Slough: 70%  Drainage (amount/description): Moderate  Periwound: Appears healthier  Dressing Used: Hydrogel;vasoline to periowund  Dressing: Xeroform, Vaseline to periwound, ABD, medipore  Sharps Used/Location: Forceps scalpel/entire wound  Tissue  Remove: slough, dry skin  Physical Therapy Assessment and Plan PT Assessment and Plan Clinical Impression Statement: Pt has made significant gains with PT. BERG score has improve 5 points. Pt is pleased with progress but would like to be independently ambulating with SPC. Pt would benefit from continuing skilled PT to improve balance and gait mechanics. Pt would also benefit from continuing wound care to left foot facilitate healing and  prevent infection. PT Plan: Recommend to continue skilled PT 2x per week x 4 weeks.    Goals Home Exercise Program Pt/caregiver will Perform Home Exercise Program: For increased strengthening (Bed exercises) PT Short Term Goals Time to Complete Short Term Goals: 2 weeks PT Short Term Goal 1: Pt to be able to ambulate with RW x 50 ft. PT Short Term Goal 1 - Progress: Met PT Short Term Goal 2: Pt to be able to verbalize the importance of keeping her sugar under control in order to keep from becoming an AKA vs. BKA PT Short Term Goal 2 - Progress: Met PT Short Term Goal 3: Left foot wound to be completely healed. PT Short Term Goal 3 - Progress:  (Goal set today) PT Long Term Goals Time to Complete Long Term Goals: 8 weeks PT Long Term Goal 1: Pt to be able to don and doff prothesis independently PT Long Term Goal 1 - Progress: Progressing toward goal PT Long Term Goal 2: Pt to be able to walk I with prothesis with least assistive device x 300 ft  PT Long Term Goal 2 - Progress:  (Goal set today) Long Term Goal 3: Pt able to ambulate on uneven surfaces and negotiate curbs with least restrictive assistive device Long Term Goal 3 Progress:  (Goal set today)  Problem List Patient Active Problem List   Diagnosis Date Noted  . Acute encephalopathy 03/16/2013  . Acute on chronic renal failure 03/16/2013  . Hypoglycemia 03/16/2013  . CHF (congestive heart failure)   . Unstable balance 01/16/2013  . Difficulty in walking(719.7) 01/16/2013  . Infected wound 01/16/2013  . Hx of right BKA 12/23/2012  . Candidiasis of breast 12/08/2012  . S/P bilateral BKA (below knee amputation) 11/30/2012  . Diabetes 11/30/2012  . Diabetic foot ulcer 10/08/2012  . UTI (lower urinary tract infection) 10/07/2012  . Bacteremia due to Staphylococcus aureus 10/07/2012  . Anxiety 02/17/2012  . Osteomyelitis of right foot 02/14/2012  . Hematuria, microscopic 02/13/2012  . Hyponatremia 02/13/2012  . PVD  (peripheral vascular disease) 02/08/2012  . Open wound of heel 02/08/2012  . Hyperlipidemia 11/11/2011  . Hypertension 11/11/2011  . Morbid obesity 11/11/2011  . Tachycardia 08/04/2011  . Acute exacerbation of CHF (congestive heart failure) 07/25/2011  . Diabetes mellitus with neuropathy 07/25/2011  . OSA (obstructive sleep apnea) 07/25/2011  . Acute bronchitis 07/25/2011  . Generalized weakness 07/25/2011  . Acute respiratory failure 07/25/2011  . Restless leg syndrome 07/25/2011  . Anemia 07/25/2011    PT - End of Session Equipment Utilized During Treatment: Gait belt Activity Tolerance: Patient tolerated treatment well General Behavior During Therapy: [facility] for tasks assessed/performed  GP Functional Assessment Tool Used: clinical judgement Functional Limitation: Self care Self Care Current Status [id number]): At least 40 percent but less than 60 percent impaired, limited or restricted Self Care Goal Status [id number]): At least 20 percent but less than 40 percent impaired, limited or restricted  Rachelle Hora, PTA 04/18/2013, 9:56 AM  Physician Documentation Your signature is required to indicate approval of the treatment plan as stated  above.  Please sign and either send electronically or make a copy of this report for your files and return this physician signed original.   Please mark one 1.__approve of plan  2. ___approve of plan with the following conditions.   ______________________________                                                          _____________________ Physician Signature                                                                                                             Date

## 2013-04-20 ENCOUNTER — Inpatient Hospital Stay (HOSPITAL_COMMUNITY): Admission: RE | Admit: 2013-04-20 | Payer: Medicare Other | Source: Ambulatory Visit | Admitting: Physical Therapy

## 2013-04-23 ENCOUNTER — Ambulatory Visit (HOSPITAL_COMMUNITY)
Admission: RE | Admit: 2013-04-23 | Discharge: 2013-04-23 | Disposition: A | Discharge status: Home / Self Care | Payer: Medicare Other | Source: Ambulatory Visit | Attending: Internal Medicine | Admitting: Internal Medicine

## 2013-04-23 NOTE — Progress Notes (Signed)
Physical Therapy Treatment Patient Details  Name: Mary [name] MRN: [id number] Date of Birth: [birth year]  Today's Date: 04/23/2013 Time: 0805-0848 PT Time Calculation (min): 43 min Charges: Selective debridement (= or < 20 cm) (0805-0825) Gait x [phone])    Visit#: 34 of 41  Re-eval: 04/18/13  Authorization: [employer]  Authorization Visit#: 34 of 41   Subjective: Symptoms/Limitations Symptoms: Pt states that she took off and put on her prosthesis on her own over the weekend. Pain Assessment Currently in Pain?: No/denies  Physical Therapy Wound Care  Location: Left lateral foot  Length: .4 cm (meausured 04/18/13)  Width: .4 cm (measured 04/18/13)  Depth: .3 cm (measured 04/18/13)  Tunneling: none noted  Granulation: 60% Slough: 40% Drainage (amount/description): Moderate  Periwound: Appears healthier  Dressing Used: Hydrogel;vasoline to periowund  Dressing: Xeroform, Vaseline to periwound, ABD, medipore  Sharps Used/Location: Forceps scalpel/entire wound  Tissue Remove: slough, dry skin  Exercise/Treatments Standing Gait Training: Gait training outdoors on slopes and stairs with RW and CGA  Physical Therapy Assessment and Plan PT Assessment and Plan Clinical Impression Statement: Left foot wound continues to approximate well. Encouraged pt to wear socks and diabetic shoes and redness was noted at fifth metatarsal head of left foot. Gait training completed outdoors on slopes and stairs. Pt is able to complete outdoor ambulation with CGA and is without LOB. Pt negotiates stairs laterally using BUE on handrail. PT Plan: Continue wound care, strengthening, NMR and gait per PT POC.     Problem List Patient Active Problem List   Diagnosis Date Noted  . Acute encephalopathy 03/16/2013  . Acute on chronic renal failure 03/16/2013  . Hypoglycemia 03/16/2013  . CHF (congestive heart failure)   . Unstable balance 01/16/2013  . Difficulty in  walking(719.7) 01/16/2013  . Infected wound 01/16/2013  . Hx of right BKA 12/23/2012  . Candidiasis of breast 12/08/2012  . S/P bilateral BKA (below knee amputation) 11/30/2012  . Diabetes 11/30/2012  . Diabetic foot ulcer 10/08/2012  . UTI (lower urinary tract infection) 10/07/2012  . Bacteremia due to Staphylococcus aureus 10/07/2012  . Anxiety 02/17/2012  . Osteomyelitis of right foot 02/14/2012  . Hematuria, microscopic 02/13/2012  . Hyponatremia 02/13/2012  . PVD (peripheral vascular disease) 02/08/2012  . Open wound of heel 02/08/2012  . Hyperlipidemia 11/11/2011  . Hypertension 11/11/2011  . Morbid obesity 11/11/2011  . Tachycardia 08/04/2011  . Acute exacerbation of CHF (congestive heart failure) 07/25/2011  . Diabetes mellitus with neuropathy 07/25/2011  . OSA (obstructive sleep apnea) 07/25/2011  . Acute bronchitis 07/25/2011  . Generalized weakness 07/25/2011  . Acute respiratory failure 07/25/2011  . Restless leg syndrome 07/25/2011  . Anemia 07/25/2011    PT - End of Session Equipment Utilized During Treatment: Gait belt Activity Tolerance: Patient tolerated treatment well General Behavior During Therapy: [facility] for tasks assessed/performed    Rachelle Hora, PTA  04/23/2013, 9:42 AM

## 2013-04-25 ENCOUNTER — Ambulatory Visit (HOSPITAL_COMMUNITY)
Admission: RE | Admit: 2013-04-25 | Discharge: 2013-04-25 | Disposition: A | Discharge status: Home / Self Care | Payer: Medicare Other | Source: Ambulatory Visit | Attending: Internal Medicine | Admitting: Internal Medicine

## 2013-04-25 NOTE — Progress Notes (Signed)
Physical Therapy Treatment Patient Details  Name: Mary [name] MRN: [id number] Date of Birth: 14-Aug-1945  Today's Date: 04/25/2013 Time: 0805-0850 PT Time Calculation (min): 45 min Charges: Selective debridement (= or < 20 cm) [id number]) Gait training x [phone])   Visit#: 35 of 41  Re-eval: 04/18/13  Authorization: [employer]  Authorization Visit#: 35 of 41   Subjective: Symptoms/Limitations Symptoms: Pt states that she wants to walk with a cane but she just doesn't feel stable. Pain Assessment Currently in Pain?: No/denies  Exercise/Treatments Aerobic Stationary Bike: Nustep 10 minutes seat 10 hills #3 level 4 resistance level 4 SPM  for LE strengthening and activity tolerance Standing Stairs: Backward with walker and assistance from husband Gait Training: Gait training around dept with [facility] and supervision; No AD and CGA  Physical Therapy Assessment and Plan PT Assessment and Plan Clinical Impression Statement: Gait training completed with [facility] and supervision. Began ambulation without AD to improve balance and confidence. Completed stairs training backward with walker as pt will be going to a family member's house that has 3 stairs at entry and no handrail. Pt and husband are able to complete this technique independently after training. Left foot wound continues to progress well and is without signs or sx of infection. Changed dressing type to honey gel to improve healing. PT Plan: Continue wound care, strengthening, NMR and gait per PT POC.     Problem List Patient Active Problem List   Diagnosis Date Noted  . Acute encephalopathy 03/16/2013  . Acute on chronic renal failure 03/16/2013  . Hypoglycemia 03/16/2013  . CHF (congestive heart failure)   . Unstable balance 01/16/2013  . Difficulty in walking(719.7) 01/16/2013  . Infected wound 01/16/2013  . Hx of right BKA 12/23/2012  . Candidiasis of breast 12/08/2012  . S/P bilateral BKA (below  knee amputation) 11/30/2012  . Diabetes 11/30/2012  . Diabetic foot ulcer 10/08/2012  . UTI (lower urinary tract infection) 10/07/2012  . Bacteremia due to Staphylococcus aureus 10/07/2012  . Anxiety 02/17/2012  . Osteomyelitis of right foot 02/14/2012  . Hematuria, microscopic 02/13/2012  . Hyponatremia 02/13/2012  . PVD (peripheral vascular disease) 02/08/2012  . Open wound of heel 02/08/2012  . Hyperlipidemia 11/11/2011  . Hypertension 11/11/2011  . Morbid obesity 11/11/2011  . Tachycardia 08/04/2011  . Acute exacerbation of CHF (congestive heart failure) 07/25/2011  . Diabetes mellitus with neuropathy 07/25/2011  . OSA (obstructive sleep apnea) 07/25/2011  . Acute bronchitis 07/25/2011  . Generalized weakness 07/25/2011  . Acute respiratory failure 07/25/2011  . Restless leg syndrome 07/25/2011  . Anemia 07/25/2011    PT - End of Session Equipment Utilized During Treatment: Gait belt Activity Tolerance: Patient tolerated treatment well General Behavior During Therapy: [facility] for tasks assessed/performed  Rachelle Hora, PTA  04/25/2013, 10:12 AM

## 2013-05-02 ENCOUNTER — Ambulatory Visit (HOSPITAL_COMMUNITY)
Admission: RE | Admit: 2013-05-02 | Discharge: 2013-05-02 | Disposition: A | Discharge status: Home / Self Care | Payer: Medicare Other | Source: Ambulatory Visit | Attending: Internal Medicine | Admitting: Internal Medicine

## 2013-05-02 DIAGNOSIS — T8189XA Other complications of procedures, not elsewhere classified, initial encounter: Secondary | ICD-10-CM | POA: Insufficient documentation

## 2013-05-02 DIAGNOSIS — IMO0001 Reserved for inherently not codable concepts without codable children: Secondary | ICD-10-CM | POA: Insufficient documentation

## 2013-05-02 DIAGNOSIS — R262 Difficulty in walking, not elsewhere classified: Secondary | ICD-10-CM | POA: Insufficient documentation

## 2013-05-02 DIAGNOSIS — Y849 Medical procedure, unspecified as the cause of abnormal reaction of the patient, or of later complication, without mention of misadventure at the time of the procedure: Secondary | ICD-10-CM | POA: Insufficient documentation

## 2013-05-02 DIAGNOSIS — E119 Type 2 diabetes mellitus without complications: Secondary | ICD-10-CM | POA: Insufficient documentation

## 2013-05-02 NOTE — Progress Notes (Signed)
Physical Therapy Treatment Patient Details  Name: Mary [name] MRN: [id number] Date of Birth: 02-Oct-1945  Today's Date: 05/02/2013 Time: [id number] PT Time Calculation (min): 40 min Charges: Selective debridement (= or < 20 cm) [id number]) Self care x 20 [phone])   Visit#: 36 of 41  Re-eval: 04/18/13  Authorization: [employer]  Authorization Visit#: 36 of 41   Subjective: Symptoms/Limitations Symptoms: Pt states that she is not getting good rest secondary to restless leg syndrome. Pain Assessment Currently in Pain?: No/denies  Physical Therapy Wound Care  Location: Left lateral foot  Length: .4 cm (meausured 04/18/13)  Width: .4 cm (measured 04/18/13)  Depth: .3 cm (measured 04/18/13)  Tunneling: none noted  Granulation: 60%  Slough: 40%  Drainage (amount/description): Moderate  Periwound: Appears healthier  Dressing Used: Hydrogel;vasoline to periowund  Dressing: Xeroform, Vaseline to periwound, ABD, medipore  Sharps Used/Location: Forceps scalpel/entire wound  Tissue Remove: slough, dry skin   Physical Therapy Assessment and Plan PT Assessment and Plan Clinical Impression Statement: Pt enters clinic distraught because of loss of sleep secondary to restless leg. She states that it is unbearable and she is not getting any sleep. Recommended that pt return to MD to address this issue. Also suggested that pt speak with a counselor about the issues she is currently dealing with. Wound continues to progress well. Wound appears heathy and is without signs or sx of infection. PT Plan: Continue wound care, strengthening, NMR and gait per PT POC.     Problem List Patient Active Problem List   Diagnosis Date Noted  . Acute encephalopathy 03/16/2013  . Acute on chronic renal failure 03/16/2013  . Hypoglycemia 03/16/2013  . CHF (congestive heart failure)   . Unstable balance 01/16/2013  . Difficulty in walking(719.7) 01/16/2013  . Infected wound  01/16/2013  . Hx of right BKA 12/23/2012  . Candidiasis of breast 12/08/2012  . S/P bilateral BKA (below knee amputation) 11/30/2012  . Diabetes 11/30/2012  . Diabetic foot ulcer 10/08/2012  . UTI (lower urinary tract infection) 10/07/2012  . Bacteremia due to Staphylococcus aureus 10/07/2012  . Anxiety 02/17/2012  . Osteomyelitis of right foot 02/14/2012  . Hematuria, microscopic 02/13/2012  . Hyponatremia 02/13/2012  . PVD (peripheral vascular disease) 02/08/2012  . Open wound of heel 02/08/2012  . Hyperlipidemia 11/11/2011  . Hypertension 11/11/2011  . Morbid obesity 11/11/2011  . Tachycardia 08/04/2011  . Acute exacerbation of CHF (congestive heart failure) 07/25/2011  . Diabetes mellitus with neuropathy 07/25/2011  . OSA (obstructive sleep apnea) 07/25/2011  . Acute bronchitis 07/25/2011  . Generalized weakness 07/25/2011  . Acute respiratory failure 07/25/2011  . Restless leg syndrome 07/25/2011  . Anemia 07/25/2011    PT - End of Session Equipment Utilized During Treatment: Gait belt Activity Tolerance: Patient tolerated treatment well General Behavior During Therapy: [facility] for tasks assessed/performed  Rachelle Hora, PTA  05/02/2013, 4:13 PM

## 2013-05-03 ENCOUNTER — Emergency Department (HOSPITAL_COMMUNITY)
Admission: EM | Admit: 2013-05-03 | Discharge: 2013-05-03 | Disposition: A | Discharge status: Home / Self Care | Payer: Medicare Other | Attending: Emergency Medicine | Admitting: Emergency Medicine

## 2013-05-03 ENCOUNTER — Encounter (HOSPITAL_COMMUNITY): Payer: Self-pay | Admitting: Emergency Medicine

## 2013-05-03 DIAGNOSIS — D649 Anemia, unspecified: Secondary | ICD-10-CM | POA: Insufficient documentation

## 2013-05-03 DIAGNOSIS — E1149 Type 2 diabetes mellitus with other diabetic neurological complication: Secondary | ICD-10-CM | POA: Insufficient documentation

## 2013-05-03 DIAGNOSIS — Z87828 Personal history of other (healed) physical injury and trauma: Secondary | ICD-10-CM | POA: Insufficient documentation

## 2013-05-03 DIAGNOSIS — R739 Hyperglycemia, unspecified: Secondary | ICD-10-CM

## 2013-05-03 DIAGNOSIS — S88119A Complete traumatic amputation at level between knee and ankle, unspecified lower leg, initial encounter: Secondary | ICD-10-CM | POA: Insufficient documentation

## 2013-05-03 DIAGNOSIS — Z7982 Long term (current) use of aspirin: Secondary | ICD-10-CM | POA: Insufficient documentation

## 2013-05-03 DIAGNOSIS — I509 Heart failure, unspecified: Secondary | ICD-10-CM | POA: Insufficient documentation

## 2013-05-03 DIAGNOSIS — I499 Cardiac arrhythmia, unspecified: Secondary | ICD-10-CM | POA: Insufficient documentation

## 2013-05-03 DIAGNOSIS — Z794 Long term (current) use of insulin: Secondary | ICD-10-CM | POA: Insufficient documentation

## 2013-05-03 DIAGNOSIS — E1142 Type 2 diabetes mellitus with diabetic polyneuropathy: Secondary | ICD-10-CM | POA: Insufficient documentation

## 2013-05-03 DIAGNOSIS — Z79899 Other long term (current) drug therapy: Secondary | ICD-10-CM | POA: Insufficient documentation

## 2013-05-03 DIAGNOSIS — M1710 Unilateral primary osteoarthritis, unspecified knee: Secondary | ICD-10-CM | POA: Insufficient documentation

## 2013-05-03 DIAGNOSIS — G2581 Restless legs syndrome: Secondary | ICD-10-CM | POA: Insufficient documentation

## 2013-05-03 DIAGNOSIS — L089 Local infection of the skin and subcutaneous tissue, unspecified: Secondary | ICD-10-CM | POA: Insufficient documentation

## 2013-05-03 LAB — COMPREHENSIVE METABOLIC PANEL
ALT: 17 U/L (ref 0–35)
AST: 16 U/L (ref 0–37)
Albumin: 3.3 g/dL — ABNORMAL LOW (ref 3.5–5.2)
Alkaline Phosphatase: 115 U/L (ref 39–117)
BUN: 40 mg/dL — ABNORMAL HIGH (ref 6–23)
Chloride: 95 mEq/L — ABNORMAL LOW (ref 96–112)
GFR calc non Af Amer: 27 mL/min — ABNORMAL LOW (ref >90)
Glucose, Bld: 288 mg/dL — ABNORMAL HIGH (ref 70–99)
Potassium: 4.5 mEq/L (ref 3.5–5.1)
Sodium: 134 mEq/L — ABNORMAL LOW (ref 135–145)
Total Protein: 7.3 g/dL (ref 6.0–8.3)

## 2013-05-03 LAB — CBC WITH DIFFERENTIAL/PLATELET
Basophils Absolute: 0 10*3/uL (ref 0.0–0.1)
Basophils Relative: 0 % (ref 0–1)
Eosinophils Absolute: 0.3 10*3/uL (ref 0.0–0.7)
Hemoglobin: 11.4 g/dL — ABNORMAL LOW (ref 12.0–15.0)
MCH: 28.8 pg (ref 26.0–34.0)
MCHC: 33.8 g/dL (ref 30.0–36.0)
MCV: 85.1 fL (ref 78.0–100.0)
Monocytes Relative: 10 % (ref 3–12)
Neutro Abs: 4.4 10*3/uL (ref 1.7–7.7)
Neutrophils Relative %: 55 % (ref 43–77)
Platelets: 233 10*3/uL (ref 150–400)
RDW: 14 % (ref 11.5–15.5)

## 2013-05-03 LAB — GLUCOSE, CAPILLARY: Glucose-Capillary: 422 mg/dL — ABNORMAL HIGH (ref 70–99)

## 2013-05-03 MED ORDER — VANCOMYCIN HCL IN DEXTROSE 1-5 GM/200ML-% IV SOLN
1000.0000 mg | Freq: Once | INTRAVENOUS | Status: AC
  Administered 2013-05-03: 1000 mg via INTRAVENOUS
  Filled 2013-05-03: qty 200

## 2013-05-03 MED ORDER — DOXYCYCLINE HYCLATE 100 MG PO CAPS: 100.0000 mg | ORAL_CAPSULE | Freq: Two times a day (BID) | ORAL | Status: DC

## 2013-05-03 MED ORDER — SODIUM CHLORIDE 0.9 % IV BOLUS (SEPSIS)
1000.0000 mL | Freq: Once | INTRAVENOUS | Status: AC
  Administered 2013-05-03: 1000 mL via INTRAVENOUS

## 2013-05-03 NOTE — ED Notes (Addendum)
Pt s cbg had been over 400 for 3-4 days, over 600 today  Lt eye red, and nose is red.  Pt has rt bka in June 2014  Took 40 units of humolog   715 pm

## 2013-05-03 NOTE — ED Notes (Signed)
cbg 422 in triage. Mary [name]

## 2013-05-03 NOTE — ED Provider Notes (Signed)
CSN: [id number]     Arrival date & time 05/03/13  1950 History  This chart was scribed for Mary Diego, MD by Jenne Campus, ED Scribe. This patient was seen in room APA05/APA05 and the patient's care was started at 8:39 PM.    Chief Complaint  Patient presents with  . Hyperglycemia   Patient is a 67 y.o. female presenting with hyperglycemia. The history is provided by the patient. No language interpreter was used.  Hyperglycemia Blood sugar level PTA:  Over 400 Severity:  Severe Duration:  1 week Timing:  Constant Progression:  Unchanged Chronicity:  Recurrent Diabetes status:  Controlled with diet and controlled with insulin Relieved by: Hemolog 50/50. Associated symptoms: malaise   Associated symptoms: no abdominal pain, no chest pain, no fatigue and no fever    HPI Comments: Jacksyn Hillestad is a 68 y.o. female with Diabetes presents to the Emergency Department complaining of hyperglycemia for the past week.  She states her sugar level has been over 400 for the past week but tonight her blood sugar was over 600. She states that she took 40 units of hemolog 50/50 PTA, but usually takes 30 at baseline. Current CBG is 422. She states she has what she believes is an ingrown hair in her right nostril and a sty in her left eye which she is currently taking medication for. She denies any fever or chills but states she feels sluggish. She denies any related pain. She currently has a BKA on the right leg.   Past Medical History  Diagnosis Date  . CHF (congestive heart failure)   . Diabetes mellitus   . Restless leg syndrome 07/25/2011  . Wound, open     right foot  . Diabetes mellitus with neuropathy 07/25/2011  . Cellulitis and abscess of foot 02/12/2012  . Osteomyelitis of right foot 02/14/2012  . Partial Achilles tendon tear 02/14/2012  . Dysrhythmia     tachy- takes Metoprolol  . OSA (obstructive sleep apnea) 07/25/2011    not using CPAP  . Arthritis     knees  . Anemia     Past Surgical History  Procedure Laterality Date  . Abdominal hysterectomy    . Achilles tendon repair    . Toe amputation      partial rt great toe  . Below knee leg amputation Right 11/25/2012    Dr Sharol Given  . Amputation Right 11/25/2012    Procedure: AMPUTATION BELOW KNEE;  Surgeon: Newt Minion, MD;  Location: [facility];  Service: Orthopedics;  Laterality: Right;  Right Below Knee Amputation  . Breast surgery Left     Lumpectomy non ca   Family History  Problem Relation Age of Onset  . Diabetes Sister    History  Substance Use Topics  . Smoking status: Never Smoker   . Smokeless tobacco: Never Used  . Alcohol Use: No   No OB history provided.  Review of Systems  Constitutional: Negative for fever, chills, appetite change and fatigue.  HENT: Negative for congestion, ear discharge and sinus pressure.   Eyes: Negative for discharge.  Respiratory: Negative for cough.   Cardiovascular: Negative for chest pain.  Gastrointestinal: Negative for abdominal pain and diarrhea.  Genitourinary: Negative for frequency and hematuria.  Musculoskeletal: Negative for back pain.  Skin: Negative for rash.       + Infection of left upper eyelid and right nare  Neurological: Negative for seizures and headaches.  Psychiatric/Behavioral: Negative for hallucinations.  All other systems reviewed  and are negative.    Allergies  Review of patient's allergies indicates no known allergies.  Home Medications   Current Outpatient Rx  Name  Route  Sig  Dispense  Refill  . aspirin EC 81 MG tablet   Oral   Take 1 tablet (81 mg total) by mouth daily.   30 tablet   1   . cholecalciferol (VITAMIN D) 1000 UNITS tablet   Oral   Take 1,000 Units by mouth 2 (two) times daily.         . Dapagliflozin Propanediol (FARXIGA) 5 MG TABS   Oral   Take 5 mg by mouth daily.         . ferrous sulfate 325 (65 FE) MG tablet   Oral   Take 125 mg by mouth every morning. 5 tablets taken by mouth every  day         . insulin lispro protamine-lispro (HUMALOG 50/50) (50-50) 100 UNIT/ML SUSP injection      Take 20 units in am and 25 units in pm   10 mL   12   . metoprolol (LOPRESSOR) 50 MG tablet   Oral   Take 1 tablet (50 mg total) by mouth 2 (two) times daily.   180 tablet   3     Please disregard script for Toprol XL 100 mg, as t ...   . Multiple Vitamin (MULTIVITAMIN WITH MINERALS) TABS   Oral   Take 1 tablet by mouth once a week.          . pramipexole (MIRAPEX) 0.5 MG tablet   Oral   Take 0.5-1 mg by mouth 3 (three) times daily with meals. 1 tablet with breakfast and lunch; 2 tablets with supper         . pravastatin (PRAVACHOL) 20 MG tablet   Oral   Take 20 mg by mouth at bedtime.         Marland Kitchen rOPINIRole (REQUIP) 2 MG tablet   Oral   Take 2 mg by mouth every evening.          Marland Kitchen spironolactone (ALDACTONE) 25 MG tablet   Oral   Take 25 mg by mouth daily.         . traMADol (ULTRAM) 50 MG tablet   Oral   Take 50 mg by mouth 4 (four) times daily as needed for pain.          . traZODone (DESYREL) 50 MG tablet   Oral   Take 50-100 mg by mouth at bedtime. 1-2 tablets depending on severity of RLS          Triage Vitals: BP 171/81  Pulse 88  Temp(Src) 98.2 F (36.8 C) (Oral)  Resp 20  Ht 5\' 10"  (1.778 m)  Wt 260 lb (117.935 kg)  BMI 37.31 kg/m2  SpO2 99%  LMP 07/25/2011  Physical Exam  Constitutional: She is oriented to person, place, and time. She appears well-developed.  HENT:  Head: Normocephalic and atraumatic.  Infection in R nostril & R side of nose  Eyes: Conjunctivae and EOM are normal. No scleral icterus.  Sty on L Eye  Neck: Neck supple. No thyromegaly present.  Cardiovascular: Normal rate and regular rhythm.  Exam reveals no gallop and no friction rub.   No murmur heard. Pulmonary/Chest: Effort normal and breath sounds normal. No stridor. She has no wheezes. She has no rales. She exhibits no tenderness.  Abdominal: She exhibits no  distension. There is no tenderness. There is  no rebound.  Musculoskeletal: Normal range of motion. She exhibits no edema.  Lymphadenopathy:    She has no cervical adenopathy.  Neurological: She is alert and oriented to person, place, and time. She exhibits normal muscle tone. Coordination normal.  Skin: Skin is warm and dry. No rash noted. No erythema.  Psychiatric: She has a normal mood and affect. Her behavior is normal.    ED Course  Procedures (including critical care time)  Medications  vancomycin (VANCOCIN) IVPB 1000 mg/200 mL premix (1,000 mg Intravenous New Bag/Given 05/03/13 2114)  sodium chloride 0.9 % bolus 1,000 mL (1,000 mLs Intravenous New Bag/Given 05/03/13 2114)   DIAGNOSTIC STUDIES: Oxygen Saturation is 99% on room air, normal by my interpretation.    COORDINATION OF CARE: 8:44 [id number] treatment plan which includes CBC, CMP, Blood work with pt at bedside and pt agreed to plan.    Labs Review Labs Reviewed  GLUCOSE, CAPILLARY - Abnormal; Notable for the following:    Glucose-Capillary 422 (*)    All other components within normal limits  CBC WITH DIFFERENTIAL - Abnormal; Notable for the following:    Hemoglobin 11.4 (*)    HCT 33.7 (*)    All other components within normal limits  COMPREHENSIVE METABOLIC PANEL - Abnormal; Notable for the following:    Sodium 134 (*)    Chloride 95 (*)    Glucose, Bld 288 (*)    BUN 40 (*)    Creatinine, Ser 1.85 (*)    Albumin 3.3 (*)    Total Bilirubin 0.2 (*)    GFR calc non Af Amer 27 (*)    GFR calc Af Amer 31 (*)    All other components within normal limits   Imaging Review No results found.  EKG Interpretation   None       MDM  Hyperglycemia and skin infection.  Pt improved and will follow up with pcp.  tx doxy  Mary Diego, MD 05/04/13 1051

## 2013-05-04 ENCOUNTER — Ambulatory Visit (HOSPITAL_COMMUNITY): Payer: Medicare Other | Admitting: Physical Therapy

## 2013-05-07 ENCOUNTER — Ambulatory Visit (HOSPITAL_COMMUNITY): Payer: Medicare Other | Admitting: *Deleted

## 2013-05-07 ENCOUNTER — Telehealth (HOSPITAL_COMMUNITY): Payer: Self-pay

## 2013-05-09 ENCOUNTER — Ambulatory Visit (HOSPITAL_COMMUNITY)
Admission: RE | Admit: 2013-05-09 | Discharge: 2013-05-09 | Disposition: A | Discharge status: Home / Self Care | Payer: Medicare Other | Source: Ambulatory Visit | Attending: *Deleted | Admitting: *Deleted

## 2013-05-09 NOTE — Progress Notes (Signed)
Physical Therapy Treatment Patient Details  Name: Mary [name] MRN: [id number] Date of Birth: 1946/02/15  Today's Date: 05/09/2013 Time: [id number] PT Time Calculation (min): 36 min Visit#: 37 of 41  Re-eval: 05/16/13 Charges:  Suzi Roots <20cm  Subjective: Symptoms/Limitations Symptoms: Pt states she is ready to be done with therapy.  Pt states she is comfortable using her walker and is independent with it.  Pt states she is too fearful to ever use the cane.  Pt states she only wants to continue woundcare at this time.       Physical Therapy Wound Care  Location: Left lateral foot  Length: .4 cm   Width: .4 cm   Depth: .3 cm   Tunneling: none noted  Granulation: 80%  Slough: 20%  Drainage (amount/description): Minimal  Periwound: Appears healthier but with callous/undermining debrided this visit  Dressing: Xeroform, Vaseline to periwound, gauze, medipore  Sharps Used/Location: Forceps entire wound  Tissue Remove: slough, dry skin     Physical Therapy Assessment and Plan PT Assessment and Plan Clinical Impression Statement: Expressed concerns with patient regarding continuing therapy.  Wound remeasured and size remains unchanged from 11/19 visit.  Pt encouraged to continue HEP and progress balance/strength.  Recommended continuing woundcare 1X week for debridement and to insure proper healing to prevent further infection and complications. PT Plan: Re-evaluate next visit.  Perform BERG balance test.  Discharge strengthening/balance per patient request and continue woundcare 1X week only.     Problem List Patient Active Problem List   Diagnosis Date Noted  . Acute encephalopathy 03/16/2013  . Acute on chronic renal failure 03/16/2013  . Hypoglycemia 03/16/2013  . CHF (congestive heart failure)   . Unstable balance 01/16/2013  . Difficulty in walking(719.7) 01/16/2013  . Infected wound 01/16/2013  . Hx of right BKA 12/23/2012  . Candidiasis of breast 12/08/2012  . S/P  bilateral BKA (below knee amputation) 11/30/2012  . Diabetes 11/30/2012  . Diabetic foot ulcer 10/08/2012  . UTI (lower urinary tract infection) 10/07/2012  . Bacteremia due to Staphylococcus aureus 10/07/2012  . Anxiety 02/17/2012  . Osteomyelitis of right foot 02/14/2012  . Hematuria, microscopic 02/13/2012  . Hyponatremia 02/13/2012  . PVD (peripheral vascular disease) 02/08/2012  . Open wound of heel 02/08/2012  . Hyperlipidemia 11/11/2011  . Hypertension 11/11/2011  . Morbid obesity 11/11/2011  . Tachycardia 08/04/2011  . Acute exacerbation of CHF (congestive heart failure) 07/25/2011  . Diabetes mellitus with neuropathy 07/25/2011  . OSA (obstructive sleep apnea) 07/25/2011  . Acute bronchitis 07/25/2011  . Generalized weakness 07/25/2011  . Acute respiratory failure 07/25/2011  . Restless leg syndrome 07/25/2011  . Anemia 07/25/2011    PT - End of Session Equipment Utilized During Treatment: Gait belt Activity Tolerance: Patient tolerated treatment well General Behavior During Therapy: [facility] for tasks assessed/performed  GP Functional Assessment Tool Used: clinical judgement  Teena Irani, PTA/CLT 05/09/2013, 9:29 AM

## 2013-05-11 ENCOUNTER — Ambulatory Visit (HOSPITAL_COMMUNITY): Payer: Medicare Other | Admitting: Physical Therapy

## 2013-05-14 ENCOUNTER — Ambulatory Visit (HOSPITAL_COMMUNITY): Payer: Medicare Other | Admitting: *Deleted

## 2013-05-15 ENCOUNTER — Ambulatory Visit (HOSPITAL_COMMUNITY)
Admission: RE | Admit: 2013-05-15 | Discharge: 2013-05-15 | Disposition: A | Discharge status: Home / Self Care | Payer: Medicare Other | Source: Ambulatory Visit | Attending: *Deleted | Admitting: *Deleted

## 2013-05-15 NOTE — Evaluation (Addendum)
Physical Therapy Re-evaluation  Patient Details  Name: Mary [name] MRN: [id number] Date of Birth: 09/08/1945  Today's Date: 05/15/2013 Time: 0805-0840 PT Time Calculation (min): 35 min Charges: Selective debridement (= or < 20 cm)(0805-0825) Self care x 15'(0825-0840)               Visit#: 36 of 22  Re-eval: 05/16/13 Assessment Diagnosis: R BKA Surgical Date: 11/25/12  Authorization: [employer]    Authorization Visit#: 28 of 21   Past Medical History:  Past Medical History  Diagnosis Date  . CHF (congestive heart failure)   . Diabetes mellitus   . Restless leg syndrome 07/25/2011  . Wound, open     right foot  . Diabetes mellitus with neuropathy 07/25/2011  . Cellulitis and abscess of foot 02/12/2012  . Osteomyelitis of right foot 02/14/2012  . Partial Achilles tendon tear 02/14/2012  . Dysrhythmia     tachy- takes Metoprolol  . OSA (obstructive sleep apnea) 07/25/2011    not using CPAP  . Arthritis     knees  . Anemia    Past Surgical History:  Past Surgical History  Procedure Laterality Date  . Abdominal hysterectomy    . Achilles tendon repair    . Toe amputation      partial rt great toe  . Below knee leg amputation Right 11/25/2012    Dr Sharol Given  . Amputation Right 11/25/2012    Procedure: AMPUTATION BELOW KNEE;  Surgeon: Newt Minion, MD;  Location: [facility];  Service: Orthopedics;  Laterality: Right;  Right Below Knee Amputation  . Breast surgery Left     Lumpectomy non ca    Subjective Symptoms/Limitations Symptoms: Pt reports that she would like to be discharged from strength and balance training and only complete wound care.   Pain Assessment Currently in Pain?: No/denies   Physical Therapy Wound Care  Location: Left lateral foot  Length: .4 cm  Width: .4 cm  Depth: .3 cm  Tunneling: none noted  Granulation: 80%  Slough: 20%  Drainage (amount/description): Minimal  Dressing: Xeroform, Vaseline to periwound, gauze, medipore   Sharps Used/Location: Forceps entire wound  Tissue Remove: slough, dry skin  Assessment BLE remains 5/5 throughout   Exercise/Treatments Mobility/Balance  Berg Balance Test Sit to Stand: Able to stand  independently using hands Standing Unsupported: Able to stand safely 2 minutes Sitting with Back Unsupported but Feet Supported on Floor or Stool: Able to sit safely and securely 2 minutes Stand to Sit: Sits safely with minimal use of hands Transfers: Able to transfer safely, minor use of hands Standing Unsupported with Eyes Closed: Able to stand 10 seconds safely Standing Ubsupported with Feet Together: Able to place feet together independently and stand for 1 minute with supervision From Standing, Reach Forward with Outstretched Arm: Can reach forward >12 cm safely (5") From Standing Position, Pick up Object from Floor: Able to pick up shoe safely and easily From Standing Position, Turn to Look Behind Over each Shoulder: Looks behind from both sides and weight shifts well Turn 360 Degrees: Able to turn 360 degrees safely but slowly Standing Unsupported, Alternately Place Feet on Step/Stool: Able to complete >2 steps/needs minimal assist Standing Unsupported, One Foot in Front: Able to take small step independently and hold 30 seconds Standing on One Leg: Tries to lift leg/unable to hold 3 seconds but remains standing independently Total Score: 43 (was 41 on 04/18/13)  Physical Therapy Assessment and Plan PT Assessment and Plan Clinical Impression Statement: BERG score  has improved 2 points. Pt is ambulating independently with RW. She is able to negotiate stairs and curbs with husband present. She states that she would like to be discharged from strength/balance training and continue coming to therapy for wound care. Wound appears healthier overall  Wound is without signs or sx of infection. PT Plan: Recommend D/C from strength, balance and gait training. Continue wound care 1x per week  x 4 more weeks.     Goals Home Exercise Program Pt/caregiver will Perform Home Exercise Program: For increased strengthening (Bed exercises) PT Short Term Goals Time to Complete Short Term Goals: 2 weeks PT Short Term Goal 1: Pt to be able to ambulate with RW x 50 ft. PT Short Term Goal 1 - Progress: Met PT Short Term Goal 2: Pt to be able to verbalize the importance of keeping her sugar under control in order to keep from becoming an AKA vs. BKA PT Short Term Goal 2 - Progress: Met PT Short Term Goal 3: Left foot wound to be completely healed. PT Short Term Goal 3 - Progress: Progressing toward goal PT Long Term Goals Time to Complete Long Term Goals: 8 weeks PT Long Term Goal 1: Pt to be able to don and doff prothesis independently PT Long Term Goal 1 - Progress: Progressing toward goal PT Long Term Goal 2: Pt to be able to walk I with prothesis with least assistive device x 300 ft  PT Long Term Goal 2 - Progress: Met Long Term Goal 3: Pt able to ambulate on uneven surfaces and negotiate curbs with least restrictive assistive device Long Term Goal 3 Progress: Met PT Long Term Goal 5: Pt to verbalize the importance of inspecting Rt residual limb and Lt feet on a daily basis. Long Term Goal 5 Progress: Met PT Long Term Goal 6: Pt to be able to step up and off a curb I with prothesis and least restrictive assistive device. Long Term Goal 6 Progress: Met  Problem List Patient Active Problem List   Diagnosis Date Noted  . Acute encephalopathy 03/16/2013  . Acute on chronic renal failure 03/16/2013  . Hypoglycemia 03/16/2013  . CHF (congestive heart failure)   . Unstable balance 01/16/2013  . Difficulty in walking(719.7) 01/16/2013  . Infected wound 01/16/2013  . Hx of right BKA 12/23/2012  . Candidiasis of breast 12/08/2012  . S/P bilateral BKA (below knee amputation) 11/30/2012  . Diabetes 11/30/2012  . Diabetic foot ulcer 10/08/2012  . UTI (lower urinary tract infection)  10/07/2012  . Bacteremia due to Staphylococcus aureus 10/07/2012  . Anxiety 02/17/2012  . Osteomyelitis of right foot 02/14/2012  . Hematuria, microscopic 02/13/2012  . Hyponatremia 02/13/2012  . PVD (peripheral vascular disease) 02/08/2012  . Open wound of heel 02/08/2012  . Hyperlipidemia 11/11/2011  . Hypertension 11/11/2011  . Morbid obesity 11/11/2011  . Tachycardia 08/04/2011  . Acute exacerbation of CHF (congestive heart failure) 07/25/2011  . Diabetes mellitus with neuropathy 07/25/2011  . OSA (obstructive sleep apnea) 07/25/2011  . Acute bronchitis 07/25/2011  . Generalized weakness 07/25/2011  . Acute respiratory failure 07/25/2011  . Restless leg syndrome 07/25/2011  . Anemia 07/25/2011    PT - End of Session Equipment Utilized During Treatment: Gait belt Activity Tolerance: Patient tolerated treatment well General Behavior During Therapy: [facility] for tasks assessed/performed  GP Functional Assessment Tool Used: clinical judgement Functional Limitation: Self care Self Care Current Status [id number]): At least 20 percent but less than 40 percent impaired, limited or restricted Self  Care Goal Status [phone]): At least 1 percent but less than 20 percent impaired, limited or restricted  Rachelle Hora, PTA  05/15/2013, 10:27 AM  Physician Documentation Your signature is required to indicate approval of the treatment plan as stated above.  Please sign and either send electronically or make a copy of this report for your files and return this physician signed original.   Please mark one 1.__approve of plan  2. ___approve of plan with the following conditions.   ______________________________                                                          _____________________ Physician Signature                                                                                                             Date

## 2013-05-16 ENCOUNTER — Ambulatory Visit (HOSPITAL_COMMUNITY): Payer: Medicare Other | Admitting: Physical Therapy

## 2013-05-21 ENCOUNTER — Ambulatory Visit (HOSPITAL_COMMUNITY)
Admission: RE | Admit: 2013-05-21 | Discharge: 2013-05-21 | Disposition: A | Discharge status: Home / Self Care | Payer: Medicare Other | Source: Ambulatory Visit | Attending: *Deleted | Admitting: *Deleted

## 2013-05-21 NOTE — Progress Notes (Signed)
Physical Therapy Treatment Patient Details  Name: Mary [name] MRN: [id number] Date of Birth: 16-Jun-1945  Today's Date: 05/21/2013 Time: [id number] PT Time Calculation (min): 24 min Charges: Selective debridement (= or < 20 cm)   Visit#: 39 of 42  Re-eval: 06/12/13  Authorization: [employer]  Authorization Visit#: 38 of 42   Subjective: Symptoms/Limitations Symptoms: Pt states that dressing sayed on all week. Pain Assessment Currently in Pain?: No/denies  Physical Therapy Wound Care  Location: Left lateral foot  Tunneling: none noted  Granulation: 80%  Slough: 20%  Drainage (amount/description): Minimal  Periwound: Calloused Dressing: Xeroform, Vaseline to periwound, gauze, medipore  Sharps Used/Location: Forceps, scalpel entire wound and calloused periwound Tissue Remove: slough, dry skin  Physical Therapy Assessment and Plan PT Assessment and Plan Clinical Impression Statement: Wound appears to be progressing well. Approximation noted. Wound is without signs or sx of infection. Educated pt on the importance of wearing diabetic shoes and sock to avoid further wounds. Pt tolerates debridement well.  PT Plan: Continue wound care per PT POC.     Problem List Patient Active Problem List   Diagnosis Date Noted  . Acute encephalopathy 03/16/2013  . Acute on chronic renal failure 03/16/2013  . Hypoglycemia 03/16/2013  . CHF (congestive heart failure)   . Unstable balance 01/16/2013  . Difficulty in walking(719.7) 01/16/2013  . Infected wound 01/16/2013  . Hx of right BKA 12/23/2012  . Candidiasis of breast 12/08/2012  . S/P bilateral BKA (below knee amputation) 11/30/2012  . Diabetes 11/30/2012  . Diabetic foot ulcer 10/08/2012  . UTI (lower urinary tract infection) 10/07/2012  . Bacteremia due to Staphylococcus aureus 10/07/2012  . Anxiety 02/17/2012  . Osteomyelitis of right foot 02/14/2012  . Hematuria, microscopic 02/13/2012  . Hyponatremia  02/13/2012  . PVD (peripheral vascular disease) 02/08/2012  . Open wound of heel 02/08/2012  . Hyperlipidemia 11/11/2011  . Hypertension 11/11/2011  . Morbid obesity 11/11/2011  . Tachycardia 08/04/2011  . Acute exacerbation of CHF (congestive heart failure) 07/25/2011  . Diabetes mellitus with neuropathy 07/25/2011  . OSA (obstructive sleep apnea) 07/25/2011  . Acute bronchitis 07/25/2011  . Generalized weakness 07/25/2011  . Acute respiratory failure 07/25/2011  . Restless leg syndrome 07/25/2011  . Anemia 07/25/2011    PT - End of Session Equipment Utilized During Treatment: Gait belt Activity Tolerance: Patient tolerated treatment well General Behavior During Therapy: [facility] for tasks assessed/performed  Rachelle Hora, PTA  05/21/2013, 8:40 AM

## 2013-05-23 ENCOUNTER — Ambulatory Visit (HOSPITAL_COMMUNITY): Payer: Medicare Other | Admitting: Physical Therapy

## 2013-05-28 ENCOUNTER — Inpatient Hospital Stay (HOSPITAL_COMMUNITY): Admission: RE | Admit: 2013-05-28 | Payer: Medicare Other | Source: Ambulatory Visit | Admitting: *Deleted

## 2013-06-04 ENCOUNTER — Ambulatory Visit (HOSPITAL_COMMUNITY): Payer: Medicare Other | Admitting: *Deleted

## 2013-06-05 ENCOUNTER — Telehealth (HOSPITAL_COMMUNITY): Payer: Self-pay | Admitting: *Deleted

## 2013-06-10 ENCOUNTER — Emergency Department (HOSPITAL_COMMUNITY): Payer: Medicare HMO

## 2013-06-10 ENCOUNTER — Encounter (HOSPITAL_COMMUNITY): Payer: Self-pay | Admitting: Emergency Medicine

## 2013-06-10 ENCOUNTER — Emergency Department (HOSPITAL_COMMUNITY)
Admission: EM | Admit: 2013-06-10 | Discharge: 2013-06-10 | Disposition: A | Discharge status: Home / Self Care | Payer: Medicare HMO | Attending: Emergency Medicine | Admitting: Emergency Medicine

## 2013-06-10 DIAGNOSIS — E1149 Type 2 diabetes mellitus with other diabetic neurological complication: Secondary | ICD-10-CM | POA: Insufficient documentation

## 2013-06-10 DIAGNOSIS — I509 Heart failure, unspecified: Secondary | ICD-10-CM

## 2013-06-10 DIAGNOSIS — M1710 Unilateral primary osteoarthritis, unspecified knee: Secondary | ICD-10-CM | POA: Insufficient documentation

## 2013-06-10 DIAGNOSIS — Z79899 Other long term (current) drug therapy: Secondary | ICD-10-CM | POA: Insufficient documentation

## 2013-06-10 DIAGNOSIS — Z872 Personal history of diseases of the skin and subcutaneous tissue: Secondary | ICD-10-CM | POA: Insufficient documentation

## 2013-06-10 DIAGNOSIS — Z8669 Personal history of other diseases of the nervous system and sense organs: Secondary | ICD-10-CM | POA: Insufficient documentation

## 2013-06-10 DIAGNOSIS — IMO0002 Reserved for concepts with insufficient information to code with codable children: Secondary | ICD-10-CM

## 2013-06-10 DIAGNOSIS — J189 Pneumonia, unspecified organism: Secondary | ICD-10-CM

## 2013-06-10 DIAGNOSIS — E1142 Type 2 diabetes mellitus with diabetic polyneuropathy: Secondary | ICD-10-CM | POA: Insufficient documentation

## 2013-06-10 DIAGNOSIS — Z7982 Long term (current) use of aspirin: Secondary | ICD-10-CM | POA: Insufficient documentation

## 2013-06-10 DIAGNOSIS — Z794 Long term (current) use of insulin: Secondary | ICD-10-CM | POA: Insufficient documentation

## 2013-06-10 DIAGNOSIS — D649 Anemia, unspecified: Secondary | ICD-10-CM

## 2013-06-10 DIAGNOSIS — Z792 Long term (current) use of antibiotics: Secondary | ICD-10-CM | POA: Insufficient documentation

## 2013-06-10 DIAGNOSIS — J159 Unspecified bacterial pneumonia: Secondary | ICD-10-CM | POA: Insufficient documentation

## 2013-06-10 LAB — CBC WITH DIFFERENTIAL/PLATELET
Basophils Absolute: 0 10*3/uL (ref 0.0–0.1)
Basophils Relative: 0 % (ref 0–1)
Eosinophils Absolute: 0.4 10*3/uL (ref 0.0–0.7)
Eosinophils Relative: 5 % (ref 0–5)
HEMATOCRIT: 28.7 % — AB (ref 36.0–46.0)
HEMOGLOBIN: 9.7 g/dL — AB (ref 12.0–15.0)
LYMPHS PCT: 27 % (ref 12–46)
Lymphs Abs: 2.1 10*3/uL (ref 0.7–4.0)
MCH: 29 pg (ref 26.0–34.0)
MCHC: 33.8 g/dL (ref 30.0–36.0)
MCV: 85.7 fL (ref 78.0–100.0)
MONO ABS: 0.9 10*3/uL (ref 0.1–1.0)
MONOS PCT: 12 % (ref 3–12)
NEUTROS ABS: 4.4 10*3/uL (ref 1.7–7.7)
NEUTROS PCT: 56 % (ref 43–77)
Platelets: 265 10*3/uL (ref 150–400)
RBC: 3.35 MIL/uL — AB (ref 3.87–5.11)
RDW: 14.2 % (ref 11.5–15.5)
WBC: 7.8 10*3/uL (ref 4.0–10.5)

## 2013-06-10 LAB — D-DIMER, QUANTITATIVE: D-Dimer, Quant: 1.8 ug/mL-FEU — ABNORMAL HIGH (ref 0.00–0.48)

## 2013-06-10 LAB — COMPREHENSIVE METABOLIC PANEL
ALT: 14 U/L (ref 0–35)
AST: 16 U/L (ref 0–37)
Albumin: 3.1 g/dL — ABNORMAL LOW (ref 3.5–5.2)
Alkaline Phosphatase: 86 U/L (ref 39–117)
BILIRUBIN TOTAL: 0.5 mg/dL (ref 0.3–1.2)
BUN: 18 mg/dL (ref 6–23)
CHLORIDE: 103 meq/L (ref 96–112)
CO2: 26 meq/L (ref 19–32)
Calcium: 8.3 mg/dL — ABNORMAL LOW (ref 8.4–10.5)
Creatinine, Ser: 1.08 mg/dL (ref 0.50–1.10)
GFR calc Af Amer: 60 mL/min — ABNORMAL LOW (ref >90)
GFR, EST NON AFRICAN AMERICAN: 52 mL/min — AB (ref >90)
Glucose, Bld: 181 mg/dL — ABNORMAL HIGH (ref 70–99)
Potassium: 4.3 mEq/L (ref 3.7–5.3)
Sodium: 140 mEq/L (ref 137–147)
Total Protein: 6.7 g/dL (ref 6.0–8.3)

## 2013-06-10 LAB — TROPONIN I: Troponin I: <0.3 ng/mL (ref <0.30)

## 2013-06-10 LAB — PRO B NATRIURETIC PEPTIDE: PRO B NATRI PEPTIDE: 1779 pg/mL — AB (ref 0–125)

## 2013-06-10 MED ORDER — IOHEXOL 350 MG/ML SOLN
100.0000 mL | Freq: Once | INTRAVENOUS | Status: AC | PRN
  Administered 2013-06-10: 100 mL via INTRAVENOUS

## 2013-06-10 MED ORDER — IPRATROPIUM BROMIDE 0.02 % IN SOLN
RESPIRATORY_TRACT | Status: AC
  Filled 2013-06-10: qty 2.5

## 2013-06-10 MED ORDER — LORAZEPAM 2 MG/ML IJ SOLN
0.5000 mg | Freq: Once | INTRAMUSCULAR | Status: AC
  Administered 2013-06-10: 0.5 mg via INTRAVENOUS
  Filled 2013-06-10: qty 1

## 2013-06-10 MED ORDER — LEVOFLOXACIN 750 MG PO TABS: 750.0000 mg | ORAL_TABLET | Freq: Every day | ORAL | Status: DC

## 2013-06-10 MED ORDER — FUROSEMIDE 10 MG/ML IJ SOLN
40.0000 mg | Freq: Once | INTRAMUSCULAR | Status: AC
  Administered 2013-06-10: 40 mg via INTRAVENOUS
  Filled 2013-06-10: qty 4

## 2013-06-10 MED ORDER — FUROSEMIDE 40 MG PO TABS: 40.0000 mg | ORAL_TABLET | Freq: Every day | ORAL | Status: DC

## 2013-06-10 MED ORDER — ALBUTEROL SULFATE (2.5 MG/3ML) 0.083% IN NEBU
INHALATION_SOLUTION | RESPIRATORY_TRACT | Status: AC
  Filled 2013-06-10: qty 3

## 2013-06-10 NOTE — ED Notes (Signed)
Pt reports sob that has worsened over past 24 hours, denies cp, states she has also had a cough

## 2013-06-10 NOTE — ED Notes (Signed)
Patient c/o "restless leg" concerned she will not be able to lay still for CT. Dr Christy Gentles made aware-order to be given.

## 2013-06-10 NOTE — ED Provider Notes (Signed)
Pt improved RA pulse ox 95% She is in no distress She wants to go home Discussed CT findings, no PE ?early pneumonia Will start levaquin (she is concerned doxycycline may not be covered by insurance) Advised to f/u with her Cardiologist   Sharyon Cable, MD 06/10/13 [phone]

## 2013-06-10 NOTE — Discharge Instructions (Signed)
Heart Failure °Heart failure is a condition in which the heart has trouble pumping blood. This means your heart does not pump blood efficiently for your body to work well. In some cases of heart failure, fluid may back up into your lungs or you may have swelling (edema) in your lower legs. Heart failure is usually a long-term (chronic) condition. It is important for you to take good care of yourself and follow your caregiver's treatment plan. °CAUSES  °Some health conditions can cause heart failure. Those health conditions include: °· High blood pressure (hypertension) causes the heart muscle to work harder than normal. When pressure in the blood vessels is high, the heart needs to pump (contract) with more force in order to circulate blood throughout the body. High blood pressure eventually causes the heart to become stiff and weak. °· Coronary artery disease (CAD) is the buildup of cholesterol and fat (plaque) in the arteries of the heart. The blockage in the arteries deprives the heart muscle of oxygen and blood. This can cause chest pain and may lead to a heart attack. High blood pressure can also contribute to CAD. °· Heart attack (myocardial infarction) occurs when 1 or more arteries in the heart become blocked. The loss of oxygen damages the muscle tissue of the heart. When this happens, part of the heart muscle dies. The injured tissue does not contract as well and weakens the heart's ability to pump blood. °· Abnormal heart valves can cause heart failure when the heart valves do not open and close properly. This makes the heart muscle pump harder to keep the blood flowing. °· Heart muscle disease (cardiomyopathy or myocarditis) is damage to the heart muscle from a variety of causes. These can include drug or alcohol abuse, infections, or unknown reasons. These can increase the risk of heart failure. °· Lung disease makes the heart work harder because the lungs do not work properly. This can cause a strain  on the heart, leading it to fail. °· Diabetes increases the risk of heart failure. High blood sugar contributes to high fat (lipid) levels in the blood. Diabetes can also cause slow damage to tiny blood vessels that carry important nutrients to the heart muscle. When the heart does not get enough oxygen and food, it can cause the heart to become weak and stiff. This leads to a heart that does not contract efficiently. °· Other conditions can contribute to heart failure. These include abnormal heart rhythms, thyroid problems, and low blood counts (anemia). °Certain unhealthy behaviors can increase the risk of heart failure. Those unhealthy behaviors include: °· Being overweight. °· Smoking or chewing tobacco. °· Eating foods high in fat and cholesterol. °· Abusing illicit drugs or alcohol. °· Lacking physical activity. °SYMPTOMS  °Heart failure symptoms may vary and can be hard to detect. Symptoms may include: °· Shortness of breath with activity, such as climbing stairs. °· Persistent cough. °· Swelling of the feet, ankles, legs, or abdomen. °· Unexplained weight gain. °· Difficulty breathing when lying flat (orthopnea). °· Waking from sleep because of the need to sit up and get more air. °· Rapid heartbeat. °· Fatigue and loss of energy. °· Feeling lightheaded, dizzy, or close to fainting. °· Loss of appetite. °· Nausea. °· Increased urination during the night (nocturia). °DIAGNOSIS  °A diagnosis of heart failure is based on your history, symptoms, physical examination, and diagnostic tests. °Diagnostic tests for heart failure may include: °· Echocardiography. °· Electrocardiography. °· Chest X-ray. °· Blood tests. °· Exercise   stress test. °· Cardiac angiography. °· Radionuclide scans. °TREATMENT  °Treatment is aimed at managing the symptoms of heart failure. Medicines, behavioral changes, or surgical intervention may be necessary to treat heart failure. °· Medicines to help treat heart failure may  include: °· Angiotensin-converting enzyme (ACE) inhibitors. This type of medicine blocks the effects of a blood protein called angiotensin-converting enzyme. ACE inhibitors relax (dilate) the blood vessels and help lower blood pressure. °· Angiotensin receptor blockers. This type of medicine blocks the actions of a blood protein called angiotensin. Angiotensin receptor blockers dilate the blood vessels and help lower blood pressure. °· Water pills (diuretics). Diuretics cause the kidneys to remove salt and water from the blood. The extra fluid is removed through urination. This loss of extra fluid lowers the volume of blood the heart pumps. °· Beta blockers. These prevent the heart from beating too fast and improve heart muscle strength. °· Digitalis. This increases the force of the heartbeat. °· Healthy behavior changes include: °· Obtaining and maintaining a healthy weight. °· Stopping smoking or chewing tobacco. °· Eating heart healthy foods. °· Limiting or avoiding alcohol. °· Stopping illicit drug use. °· Physical activity as directed by your caregiver. °· Surgical treatment for heart failure may include: °· A procedure to open blocked arteries, repair damaged heart valves, or remove damaged heart muscle tissue. °· A pacemaker to improve heart muscle function and control certain abnormal heart rhythms. °· An internal cardioverter defibrillator to treat certain serious abnormal heart rhythms. °· A left ventricular assist device to assist the pumping ability of the heart. °HOME CARE INSTRUCTIONS  °· Take your medicine as directed by your caregiver. Medicines are important in reducing the workload of your heart, slowing the progression of heart failure, and improving your symptoms. °· Do not stop taking your medicine unless directed by your caregiver. °· Do not skip any dose of medicine. °· Refill your prescriptions before you run out of medicine. Your medicines are needed every day. °· Take over-the-counter  medicine only as directed by your caregiver or pharmacist. °· Engage in moderate physical activity if directed by your caregiver. Moderate physical activity can benefit some people. The elderly and people with severe heart failure should consult with a caregiver for physical activity recommendations. °· Eat heart healthy foods. Food choices should be free of trans fat and low in saturated fat, cholesterol, and salt (sodium). Healthy choices include fresh or frozen fruits and vegetables, fish, lean meats, legumes, fat-free or low-fat dairy products, and whole grain or high fiber foods. Talk to a dietitian to learn more about heart healthy foods. °· Limit sodium if directed by your caregiver. Sodium restriction may reduce symptoms of heart failure in some people. Talk to a dietitian to learn more about heart healthy seasonings. °· Use healthy cooking methods. Healthy cooking methods include roasting, grilling, broiling, baking, poaching, steaming, or stir-frying. Talk to a dietitian to learn more about healthy cooking methods. °· Limit fluids if directed by your caregiver. Fluid restriction may reduce symptoms of heart failure in some people. °· Weigh yourself every day. Daily weights are important in the early recognition of excess fluid. You should weigh yourself every morning after you urinate and before you eat breakfast. Wear the same amount of clothing each time you weigh yourself. Record your daily weight. Provide your caregiver with your weight record. °· Monitor and record your blood pressure if directed by your caregiver. °· Check your pulse if directed by your caregiver. °· Lose weight if directed   by your caregiver. Weight loss may reduce symptoms of heart failure in some people.  Stop smoking or chewing tobacco. Nicotine makes your heart work harder by causing your blood vessels to constrict. Do not use nicotine gum or patches before talking to your caregiver.  Schedule and attend follow-up visits as  directed by your caregiver. It is important to keep all your appointments.  Limit alcohol intake to no more than 1 drink per day for nonpregnant women and 2 drinks per day for men. Drinking more than that is harmful to your heart. Tell your caregiver if you drink alcohol several times a week. Talk with your caregiver about whether alcohol is safe for you. If your heart has already been damaged by alcohol or you have severe heart failure, drinking alcohol should be stopped completely.  Stop illicit drug use.  Stay up-to-date with immunizations. It is especially important to prevent respiratory infections through current pneumococcal and influenza immunizations.  Manage other health conditions such as hypertension, diabetes, thyroid disease, or abnormal heart rhythms as directed by your caregiver.  Learn to manage stress.  Plan rest periods when fatigued.  Learn strategies to manage high temperatures. If the weather is extremely hot:  Avoid vigorous physical activity.  Use air conditioning or fans or seek a cooler location.  Avoid caffeine and alcohol.  Wear loose-fitting, lightweight, and light-colored clothing.  Learn strategies to manage cold temperatures. If the weather is extremely cold:  Avoid vigorous physical activity.  Layer clothes.  Wear mittens or gloves, a hat, and a scarf when going outside.  Avoid alcohol.  Obtain ongoing education and support as needed.  Participate or seek rehabilitation as needed to maintain or improve independence and quality of life. SEEK MEDICAL CARE IF:   Your weight increases by 03 lb/1.4 kg in 1 day or 05 lb/2.3 kg in a week.  You have increasing shortness of breath that is unusual for you.  You are unable to participate in your usual physical activities.  You tire easily.  You cough more than normal, especially with physical activity.  You have any or more swelling in areas such as your hands, feet, ankles, or abdomen.  You  are unable to sleep because it is hard to breathe.  You feel like your heart is beating fast (palpitations).  You become dizzy or lightheaded upon standing up. SEEK IMMEDIATE MEDICAL CARE IF:   You have difficulty breathing.  There is a change in mental status such as decreased alertness or difficulty with concentration.  You have a pain or discomfort in your chest.  You have an episode of fainting (syncope). MAKE SURE YOU:   Understand these instructions.  Will watch your condition.  Will get help right away if you are not doing well or get worse. Document Released: 05/17/2005 Document Revised: 09/11/2012 Document Reviewed: 06/08/2012 [facility] Patient Information 2014 [place], [place].  Furosemide tablets What is this medicine? FUROSEMIDE (fyoor [place] se mide) is a diuretic. It helps you make more urine and to lose salt and excess water from your body. This medicine is used to treat high blood pressure, and edema or swelling from heart, kidney, or liver disease. This medicine may be used for other purposes; ask your health care provider or pharmacist if you have questions. COMMON BRAND NAME(S): Delone , Lasix What should I tell my health care provider before I take this medicine? They need to know if you have any of these conditions: -abnormal blood electrolytes -diarrhea or vomiting -gout -heart  disease -kidney disease, small amounts of urine, or difficulty passing urine -liver disease -an unusual or allergic reaction to furosemide, sulfa drugs, other medicines, foods, dyes, or preservatives -pregnant or trying to get pregnant -breast-feeding How should I use this medicine? Take this medicine by mouth with a glass of water. Follow the directions on the prescription label. You may take this medicine with or without food. If it upsets your stomach, take it with food or milk. Do not take your medicine more often than directed. Remember that you will need to pass more urine after  taking this medicine. Do not take your medicine at a time of day that will cause you problems. Do not take at bedtime. Talk to your pediatrician regarding the use of this medicine in children. While this drug may be prescribed for selected conditions, precautions do apply. Overdosage: If you think you have taken too much of this medicine contact a poison control center or emergency room at once. NOTE: This medicine is only for you. Do not share this medicine with others. What if I miss a dose? If you miss a dose, take it as soon as you can. If it is almost time for your next dose, take only that dose. Do not take double or extra doses. What may interact with this medicine? -aspirin and aspirin-like medicines -certain antibiotics -chloral hydrate -cisplatin -cyclosporine -digoxin -diuretics -laxatives -lithium -medicines for blood pressure -medicines that relax muscles for surgery -methotrexate -NSAIDs, medicines for pain and inflammation like ibuprofen, naproxen, or indomethacin -phenytoin -steroid medicines like prednisone or cortisone -sucralfate This list may not describe all possible interactions. Give your health care provider a list of all the medicines, herbs, non-prescription drugs, or dietary supplements you use. Also tell them if you smoke, drink alcohol, or use illegal drugs. Some items may interact with your medicine. What should I watch for while using this medicine? Visit your doctor or health care professional for regular checks on your progress. Check your blood pressure regularly. Ask your doctor or health care professional what your blood pressure should be, and when you should contact him or her. If you are a diabetic, check your blood sugar as directed. You may need to be on a special diet while taking this medicine. Check with your doctor. Also, ask how many glasses of fluid you need to drink a day. You must not get dehydrated. You may get drowsy or dizzy. Do not  drive, use machinery, or do anything that needs mental alertness until you know how this drug affects you. Do not stand or sit up quickly, especially if you are an older patient. This reduces the risk of dizzy or fainting spells. Alcohol can make you more drowsy and dizzy. Avoid alcoholic drinks. This medicine can make you more sensitive to the sun. Keep out of the sun. If you cannot avoid being in the sun, wear protective clothing and use sunscreen. Do not use sun lamps or tanning beds/booths. What side effects may I notice from receiving this medicine? Side effects that you should report to your doctor or health care professional as soon as possible: -blood in urine or stools -dry mouth -fever or chills -hearing loss or ringing in the ears -irregular heartbeat -muscle pain or weakness, cramps -skin rash -stomach upset, pain, or nausea -tingling or numbness in the hands or feet -unusually weak or tired -vomiting or diarrhea -yellowing of the eyes or skin Side effects that usually do not require medical attention (report to your doctor or  health care professional if they continue or are bothersome): -headache -loss of appetite -unusual bleeding or bruising This list may not describe all possible side effects. Call your doctor for medical advice about side effects. You may report side effects to FDA at 1-800-FDA-1088. Where should I keep my medicine? Keep out of the reach of children. Store at room temperature between 15 and 30 degrees C (59 and 86 degrees F). Protect from light. Throw away any unused medicine after the expiration date. NOTE: This sheet is a summary. It may not cover all possible information. If you have questions about this medicine, talk to your doctor, pharmacist, or health care provider.  2014, [employer]. (2009-05-05 16:24:50)

## 2013-06-10 NOTE — ED Provider Notes (Signed)
CSN: [id number]     Arrival date & time 06/10/13  0607 History   First MD Initiated Contact with Patient 06/10/13 [phone]     Chief Complaint  Patient presents with  . Shortness of Breath   (Consider location/radiation/quality/duration/timing/severity/associated sxs/prior Treatment) Patient is a 68 y.o. female presenting with shortness of breath. The history is provided by the patient.  Shortness of Breath She has been having dyspnea which started about 36 hours ago and got worse during the night last night. There's been a mil,d nonproductive cough. She denies fever, chills, sweats. She denies arthralgias or myalgias. She denies chest pain, heaviness, tightness, pressure. Dyspnea is worse when she lays flat and is better when she sits up. There's been no nausea or vomiting or diarrhea. She's not had any sick contacts.  Past Medical History  Diagnosis Date  . CHF (congestive heart failure)   . Diabetes mellitus   . Restless leg syndrome 07/25/2011  . Wound, open     right foot  . Diabetes mellitus with neuropathy 07/25/2011  . Cellulitis and abscess of foot 02/12/2012  . Osteomyelitis of right foot 02/14/2012  . Partial Achilles tendon tear 02/14/2012  . Dysrhythmia     tachy- takes Metoprolol  . OSA (obstructive sleep apnea) 07/25/2011    not using CPAP  . Arthritis     knees  . Anemia    Past Surgical History  Procedure Laterality Date  . Abdominal hysterectomy    . Achilles tendon repair    . Toe amputation      partial rt great toe  . Below knee leg amputation Right 11/25/2012    Dr Sharol Given  . Amputation Right 11/25/2012    Procedure: AMPUTATION BELOW KNEE;  Surgeon: Newt Minion, MD;  Location: [facility];  Service: Orthopedics;  Laterality: Right;  Right Below Knee Amputation  . Breast surgery Left     Lumpectomy non ca   Family History  Problem Relation Age of Onset  . Diabetes Sister    History  Substance Use Topics  . Smoking status: Never Smoker   . Smokeless tobacco: Never  Used  . Alcohol Use: No   OB History   Grav Para Term Preterm Abortions TAB SAB Ect Mult Living                 Review of Systems  Respiratory: Positive for shortness of breath.   All other systems reviewed and are negative.    Allergies  Review of patient's allergies indicates no known allergies.  Home Medications   Current Outpatient Rx  Name  Route  Sig  Dispense  Refill  . aspirin EC 81 MG tablet   Oral   Take 1 tablet (81 mg total) by mouth daily.   30 tablet   1   . cholecalciferol (VITAMIN D) 1000 UNITS tablet   Oral   Take 1,000 Units by mouth 2 (two) times daily.         . cilostazol (PLETAL) 100 MG tablet   Oral   Take 100 mg by mouth 3 (three) times daily.         . Dapagliflozin Propanediol (FARXIGA) 5 MG TABS   Oral   Take 5 mg by mouth daily.         Marland Kitchen doxycycline (VIBRAMYCIN) 100 MG capsule   Oral   Take 1 capsule (100 mg total) by mouth 2 (two) times daily. One po bid x 7 days   20 capsule  0   . ferrous sulfate 325 (65 FE) MG tablet   Oral   Take 125 mg by mouth every morning. 5 tablets taken by mouth every day         . gabapentin (NEURONTIN) 300 MG capsule   Oral   Take 600 mg by mouth 3 (three) times daily.         . insulin lispro protamine-lispro (HUMALOG 50/50) (50-50) 100 UNIT/ML SUSP injection   Subcutaneous   Inject 20-30 Units into the skin 3 (three) times daily with meals. 30 units in the morning, 20 units in the afternoon, and 30 units at bedtime.         . Magnesium 100 MG TABS   Oral   Take 100 mg by mouth daily.         . metoprolol (LOPRESSOR) 50 MG tablet   Oral   Take 1 tablet (50 mg total) by mouth 2 (two) times daily.   180 tablet   3     Please disregard script for Toprol XL 100 mg, as t ...   . Multiple Vitamin (MULTIVITAMIN WITH MINERALS) TABS   Oral   Take 1 tablet by mouth once a week.          . pravastatin (PRAVACHOL) 20 MG tablet   Oral   Take 20 mg by mouth at bedtime.          . Pyridoxine HCl (B-6 PO)   Oral   Take 1 tablet by mouth daily.         Marland Kitchen rOPINIRole (REQUIP) 2 MG tablet   Oral   Take 2 mg by mouth every evening.          Marland Kitchen spironolactone (ALDACTONE) 25 MG tablet   Oral   Take 25 mg by mouth daily.         . traMADol (ULTRAM) 50 MG tablet   Oral   Take 50 mg by mouth 4 (four) times daily as needed for pain.          . traZODone (DESYREL) 50 MG tablet   Oral   Take 50-100 mg by mouth at bedtime. 1-2 tablets depending on severity of RLS          BP 156/54  Pulse 81  Temp(Src) 97.7 F (36.5 C) (Oral)  Resp 28  Ht 5\' 10"  (1.778 m)  Wt 256 lb (116.121 kg)  BMI 36.73 kg/m2  SpO2 97%  LMP 07/25/2011 Physical Exam  Nursing note and vitals reviewed.  68 year old female, who appears dyspneic, but is in no acute distress. Vital signs are significant for tachypnea with respiratory rate of 28, and hypertension with blood pressure 156/54. Oxygen saturation is 97%, which is normal. Head is normocephalic and atraumatic. PERRLA, EOMI. Oropharynx is clear. Neck is nontender and supple without adenopathy or JVD. Back is nontender and there is no CVA tenderness. Lungs have bibasilar rales without wheezes or rhonchi. Chest is nontender. Heart has regular rate and rhythm without murmur. Abdomen is soft, flat, nontender without masses or hepatosplenomegaly and peristalsis is normoactive. Extremities have trace edema. Right BKA is present. Skin is warm and dry without rash. Neurologic: Mental status is normal, cranial nerves are intact, there are no motor or sensory deficits.  ED Course  Procedures (including critical care time) Labs Review Results for orders placed during the hospital encounter of 06/10/13  CBC WITH DIFFERENTIAL      Result Value Range   WBC 7.8  4.0 -  10.5 K/uL   RBC 3.35 (*) 3.87 - 5.11 MIL/uL   Hemoglobin 9.7 (*) 12.0 - 15.0 g/dL   HCT 28.7 (*) 36.0 - 46.0 %   MCV 85.7  78.0 - 100.0 fL   MCH 29.0  26.0 - 34.0 pg    MCHC 33.8  30.0 - 36.0 g/dL   RDW 14.2  11.5 - 15.5 %   Platelets 265  150 - 400 K/uL   Neutrophils Relative % 56  43 - 77 %   Neutro Abs 4.4  1.7 - 7.7 K/uL   Lymphocytes Relative 27  12 - 46 %   Lymphs Abs 2.1  0.7 - 4.0 K/uL   Monocytes Relative 12  3 - 12 %   Monocytes Absolute 0.9  0.1 - 1.0 K/uL   Eosinophils Relative 5  0 - 5 %   Eosinophils Absolute 0.4  0.0 - 0.7 K/uL   Basophils Relative 0  0 - 1 %   Basophils Absolute 0.0  0.0 - 0.1 K/uL  COMPREHENSIVE METABOLIC PANEL      Result Value Range   Sodium 140  137 - 147 mEq/L   Potassium 4.3  3.7 - 5.3 mEq/L   Chloride 103  96 - 112 mEq/L   CO2 26  19 - 32 mEq/L   Glucose, Bld 181 (*) 70 - 99 mg/dL   BUN 18  6 - 23 mg/dL   Creatinine, Ser 1.08  0.50 - 1.10 mg/dL   Calcium 8.3 (*) 8.4 - 10.5 mg/dL   Total Protein 6.7  6.0 - 8.3 g/dL   Albumin 3.1 (*) 3.5 - 5.2 g/dL   AST 16  0 - 37 U/L   ALT 14  0 - 35 U/L   Alkaline Phosphatase 86  39 - 117 U/L   Total Bilirubin 0.5  0.3 - 1.2 mg/dL   GFR calc non Af Amer 52 (*) >90 mL/min   GFR calc Af Amer 60 (*) >90 mL/min  PRO B NATRIURETIC PEPTIDE      Result Value Range   Pro B Natriuretic peptide (BNP) 1779.0 (*) 0 - 125 pg/mL  D-DIMER, QUANTITATIVE      Result Value Range   D-Dimer, Quant 1.80 (*) 0.00 - 0.48 ug/mL-FEU  TROPONIN I      Result Value Range   Troponin I <0.30  <0.30 ng/mL   Imaging Review Dg Chest Port 1 View  06/10/2013   CLINICAL DATA:  Shortness of breath worsening over last 24 hr. Cough.  EXAM: PORTABLE CHEST - 1 VIEW  COMPARISON:  Chest radiograph November 14, 2012  FINDINGS: Cardiac silhouette appears mildly enlarged, mediastinal silhouette is nonsuspicious. Mild central pulmonary vasculature congestion, similar mild interstitial prominence without pleural effusions or focal consolidations. Elevated right hemidiaphragm. No pneumothorax.  Multiple EKG lines overlie the patient and may obscure subtle underlying pathology. Calcification at right humeral head  may reflect calcific tendinopathy. Large body habitus.  IMPRESSION: Mild cardiomegaly and central pulmonary vasculature congestion. Mild chronic interstitial changes.   Electronically Signed   By: Elon Alas   On: 06/10/2013 06:58   Images viewed by me.  EKG Interpretation    Date/Time:  Sunday June 10 2013 06:21:49 EST Ventricular Rate:  80 PR Interval:  150 QRS Duration: 82 QT Interval:  388 QTC Calculation: 447 R Axis:   7 Text Interpretation:  Normal sinus rhythm Possible Anterior infarct (cited on or before 15-Jan-2012) Abnormal ECG When compared with ECG of 16-Mar-2013 20:19, Questionable change in  initial forces of Septal leads Confirmed by [facility]  MD, Laneka Mcgrory ([id number]) on 06/10/2013 6:34:10 AM            MDM   1. CHF exacerbation   2. Anemia    Dyspnea which seems most likely to be due 2 CHF. She denies any recent travel but CHF is a risk factor for pulmonary embolism so she will be screened with a d-dimer. Chest x-rays obtained to rule out pneumonia and laboratory workup will include electrolytes and BNP. Old records are reviewed and she has been treated for diastolic heart failure in the past.  Chest x-ray is consistent with congestive heart failure, and BNP is elevated over baseline. Anemia is noted not significantly changed from baseline. She is given a dose of furosemide. At this point, d-dimer is still pending. Case is signed out to Dr. Christy Gentles.   Delora Fuel, MD [phone]

## 2013-06-11 ENCOUNTER — Ambulatory Visit (HOSPITAL_COMMUNITY): Payer: Medicare Other | Admitting: Physical Therapy

## 2013-06-18 ENCOUNTER — Ambulatory Visit (HOSPITAL_COMMUNITY): Payer: Medicare Other | Admitting: Physical Therapy

## 2013-07-19 ENCOUNTER — Other Ambulatory Visit: Payer: Self-pay | Admitting: Adult Health

## 2013-08-22 ENCOUNTER — Other Ambulatory Visit: Payer: Self-pay | Admitting: Adult Health

## 2013-08-30 ENCOUNTER — Ambulatory Visit (INDEPENDENT_AMBULATORY_CARE_PROVIDER_SITE_OTHER): Payer: Medicare HMO | Admitting: Cardiology

## 2013-08-30 VITALS — BP 169/56 | HR 84 | Ht 70.0 in | Wt 294.0 lb

## 2013-08-30 DIAGNOSIS — I471 Supraventricular tachycardia, unspecified: Secondary | ICD-10-CM

## 2013-08-30 DIAGNOSIS — I10 Essential (primary) hypertension: Secondary | ICD-10-CM

## 2013-08-30 DIAGNOSIS — I5032 Chronic diastolic (congestive) heart failure: Secondary | ICD-10-CM

## 2013-08-30 DIAGNOSIS — R0989 Other specified symptoms and signs involving the circulatory and respiratory systems: Secondary | ICD-10-CM

## 2013-08-30 NOTE — Progress Notes (Signed)
Clinical Summary Ms. Youngdahl is a 68 y.o.female last seen by NP Purcell Nails, this is our first visit together. She is seen for the following medical problems.  1.PSVT - reports rare palpitations - she is compliant with metoprolol  2. Chronic diastolic heart failure -denies any significant DOE - no LE edema, can have occasional orthopnea and PND that occurs every few weeks.   3. HTN - checks at home occasionally - typically 110s/60s - not currently on ACE or ARB, I do not have this history. She states it was stopped at some time. She thinks b/c of high potassium.  4. Hyperlipidemia - reports recent panel by Dr Dorris Fetch  5. Carotid bruit - no significant disease by recent US 02/2013 Past Medical History  Diagnosis Date  . CHF (congestive heart failure)   . Diabetes mellitus   . Restless leg syndrome 07/25/2011  . Wound, open     right foot  . Diabetes mellitus with neuropathy 07/25/2011  . Cellulitis and abscess of foot 02/12/2012  . Osteomyelitis of right foot 02/14/2012  . Partial Achilles tendon tear 02/14/2012  . Dysrhythmia     tachy- takes Metoprolol  . OSA (obstructive sleep apnea) 07/25/2011    not using CPAP  . Arthritis     knees  . Anemia      No Known Allergies   Current Outpatient Prescriptions  Medication Sig Dispense Refill  . aspirin EC 81 MG tablet Take 1 tablet (81 mg total) by mouth daily.  30 tablet  1  . cholecalciferol (VITAMIN D) 1000 UNITS tablet Take 1,000 Units by mouth 2 (two) times daily.      . cilostazol (PLETAL) 100 MG tablet Take 100 mg by mouth 3 (three) times daily.      . Dapagliflozin Propanediol (FARXIGA) 5 MG TABS Take 5 mg by mouth daily.      . ferrous sulfate 325 (65 FE) MG tablet Take 125 mg by mouth every morning. 5 tablets taken by mouth every day      . furosemide (LASIX) 40 MG tablet Take 1 tablet (40 mg total) by mouth daily.  15 tablet  0  . gabapentin (NEURONTIN) 300 MG capsule Take 600 mg by mouth 3 (three) times daily.       . insulin lispro protamine-lispro (HUMALOG 50/50) (50-50) 100 UNIT/ML SUSP injection Inject 20-30 Units into the skin 3 (three) times daily with meals. 30 units in the morning, 20 units in the afternoon, and 30 units at bedtime.      Marland Kitchen levofloxacin (LEVAQUIN) 750 MG tablet Take 1 tablet (750 mg total) by mouth daily. X 7 days  7 tablet  0  . Magnesium 100 MG TABS Take 100 mg by mouth daily.      . metoprolol (LOPRESSOR) 50 MG tablet TAKE ONE TABLET BY MOUTH TWICE DAILY  60 tablet  0  . Multiple Vitamin (MULTIVITAMIN WITH MINERALS) TABS Take 1 tablet by mouth once a week.       . pravastatin (PRAVACHOL) 20 MG tablet Take 20 mg by mouth at bedtime.      . Pyridoxine HCl (B-6 PO) Take 1 tablet by mouth daily.      Marland Kitchen rOPINIRole (REQUIP) 2 MG tablet Take 2 mg by mouth every evening.       Marland Kitchen spironolactone (ALDACTONE) 25 MG tablet Take 25 mg by mouth daily.      . traMADol (ULTRAM) 50 MG tablet Take 50 mg by mouth 4 (four) times  daily as needed for pain.       . traZODone (DESYREL) 50 MG tablet Take 50-100 mg by mouth at bedtime. 1-2 tablets depending on severity of RLS       No current facility-administered medications for this visit.     Past Surgical History  Procedure Laterality Date  . Abdominal hysterectomy    . Achilles tendon repair    . Toe amputation      partial rt great toe  . Below knee leg amputation Right 11/25/2012    Dr Sharol Given  . Amputation Right 11/25/2012    Procedure: AMPUTATION BELOW KNEE;  Surgeon: Newt Minion, MD;  Location: [place];  Service: Orthopedics;  Laterality: Right;  Right Below Knee Amputation  . Breast surgery Left     Lumpectomy non ca     No Known Allergies    Family History  Problem Relation Age of Onset  . Diabetes Sister      Social History Ms. Stoldt reports that she has never smoked. She has never used smokeless tobacco. Ms. Luckman reports that she does not drink alcohol.   Review of Systems CONSTITUTIONAL: No weight loss, fever,  chills, weakness or fatigue.  HEENT: Eyes: No visual loss, blurred vision, double vision or yellow sclerae.No hearing loss, sneezing, congestion, runny nose or sore throat.  SKIN: No rash or itching.  CARDIOVASCULAR: per HPI RESPIRATORY: No shortness of breath, cough or sputum.  GASTROINTESTINAL: No anorexia, nausea, vomiting or diarrhea. No abdominal pain or blood.  GENITOURINARY: No burning on urination, no polyuria NEUROLOGICAL: No headache, dizziness, syncope, paralysis, ataxia, numbness or tingling in the extremities. No change in bowel or bladder control.  MUSCULOSKELETAL: No muscle, back pain, joint pain or stiffness.  LYMPHATICS: No enlarged nodes. No history of splenectomy.  PSYCHIATRIC: No history of depression or anxiety.  ENDOCRINOLOGIC: No reports of sweating, cold or heat intolerance. No polyuria or polydipsia.  Marland Kitchen   Physical Examination p 84 bp 140/70 Wt 294 lbs BMI 42 Gen: resting comfortably, no acute distress HEENT: no scleral icterus, pupils equal round and reactive, no palptable cervical adenopathy,  CV: RRR, no m/r/g, right carotid bruit Resp: Clear to auscultation bilaterally GI: abdomen is soft, non-tender, non-distended, normal bowel sounds, no hepatosplenomegaly MSK: extremities are warm, no edema.  Skin: warm, no rash Neuro:  no focal deficits Psych: appropriate affect   Diagnostic Studies 02/2013 Echo LVEF 55-60%, mild-mod LVH, grade I diastolic dysfunction.    02/2013 Cartoid US IMPRESSION: Less than 50% stenosis in the right and left internal carotid arteries. There is plaque bilaterally as described above.    Assessment and Plan  1. PSVT - no significant symptoms, continue current meds  2. Chronic diastolic heart failure - no current symptoms, continue current therapy  3. HTN - at goal, continue current meds  4. Hyperlipidemia - will request most recent panel from Dr Dorris Fetch, continue current statin for now  5. Carotid bruit - no history  of symptoms, no significant stenosis on recent US - continue to follow clinically      Arnoldo Lenis, M.D., F.A.C.C.

## 2013-08-30 NOTE — Patient Instructions (Signed)
Your physician wants you to follow-up in: 1 year You will receive a reminder letter in the mail two months in advance. If you don't receive a letter, please call our office to schedule the follow-up appointment.    Your physician recommends that you continue on your current medications as directed. Please refer to the Current Medication list given to you today.     Thank you for choosing [facility] !  

## 2013-09-18 ENCOUNTER — Other Ambulatory Visit (HOSPITAL_COMMUNITY): Payer: Self-pay | Admitting: Internal Medicine

## 2013-09-18 ENCOUNTER — Emergency Department (HOSPITAL_COMMUNITY)
Admission: EM | Admit: 2013-09-18 | Discharge: 2013-09-19 | Disposition: A | Discharge status: Home / Self Care | Payer: Medicare HMO | Attending: Emergency Medicine | Admitting: Emergency Medicine

## 2013-09-18 ENCOUNTER — Encounter (HOSPITAL_COMMUNITY): Payer: Self-pay | Admitting: Emergency Medicine

## 2013-09-18 DIAGNOSIS — M545 Low back pain, unspecified: Secondary | ICD-10-CM

## 2013-09-18 DIAGNOSIS — M79609 Pain in unspecified limb: Secondary | ICD-10-CM | POA: Insufficient documentation

## 2013-09-18 DIAGNOSIS — E119 Type 2 diabetes mellitus without complications: Secondary | ICD-10-CM | POA: Insufficient documentation

## 2013-09-18 DIAGNOSIS — S88119A Complete traumatic amputation at level between knee and ankle, unspecified lower leg, initial encounter: Secondary | ICD-10-CM | POA: Insufficient documentation

## 2013-09-18 DIAGNOSIS — M79604 Pain in right leg: Secondary | ICD-10-CM

## 2013-09-18 DIAGNOSIS — M79605 Pain in left leg: Secondary | ICD-10-CM

## 2013-09-18 DIAGNOSIS — Z872 Personal history of diseases of the skin and subcutaneous tissue: Secondary | ICD-10-CM | POA: Insufficient documentation

## 2013-09-18 DIAGNOSIS — Z87828 Personal history of other (healed) physical injury and trauma: Secondary | ICD-10-CM | POA: Insufficient documentation

## 2013-09-18 DIAGNOSIS — IMO0002 Reserved for concepts with insufficient information to code with codable children: Secondary | ICD-10-CM

## 2013-09-18 DIAGNOSIS — Z79899 Other long term (current) drug therapy: Secondary | ICD-10-CM | POA: Insufficient documentation

## 2013-09-18 DIAGNOSIS — Z7982 Long term (current) use of aspirin: Secondary | ICD-10-CM | POA: Insufficient documentation

## 2013-09-18 DIAGNOSIS — G2581 Restless legs syndrome: Secondary | ICD-10-CM | POA: Insufficient documentation

## 2013-09-18 DIAGNOSIS — M1710 Unilateral primary osteoarthritis, unspecified knee: Secondary | ICD-10-CM | POA: Insufficient documentation

## 2013-09-18 DIAGNOSIS — I509 Heart failure, unspecified: Secondary | ICD-10-CM | POA: Insufficient documentation

## 2013-09-18 DIAGNOSIS — Z794 Long term (current) use of insulin: Secondary | ICD-10-CM | POA: Insufficient documentation

## 2013-09-18 DIAGNOSIS — G8929 Other chronic pain: Secondary | ICD-10-CM | POA: Insufficient documentation

## 2013-09-18 DIAGNOSIS — D649 Anemia, unspecified: Secondary | ICD-10-CM | POA: Insufficient documentation

## 2013-09-18 MED ORDER — HYDROMORPHONE HCL PF 2 MG/ML IJ SOLN
2.0000 mg | Freq: Once | INTRAMUSCULAR | Status: AC
Start: 2013-09-18 — End: 2013-09-18
  Administered 2013-09-18: 2 mg via INTRAMUSCULAR
  Filled 2013-09-18: qty 1

## 2013-09-18 NOTE — ED Provider Notes (Signed)
CSN: [place]:[id number]     Arrival date & time 09/18/13  2045 History  This chart was scribed for Veryl Speak, MD by Jenne Campus, ED Scribe. This patient was seen in room APA09/APA09 and the patient's care was started at 11:08 PM.      Chief Complaint  Patient presents with  . Leg Pain     The history is provided by the patient. No language interpreter was used.    HPI Comments: Mary [name] is a 68 y.o. female with a h/o right BKA due to DM infection who presents to the Emergency Department complaining of chronic bilateral leg pain that starts in the lower lumbar region and radiates all the way down to the stump and foot for the past 2 years. She states the pain has no pattern to it but sometimes gets better when she ambulates. Husband states that the pt had been swimming in the pool with complete resolution in her symptoms; however, she has been unable tot for the past few months due to worsening of her pain. She states that she has been evaluated before for the same and was originally diagnosed with restless leg syndrome which was then changed and she now denies having any diagnosis for the symptoms. She also reports being tried on everything including ibuprofen, Mirapex, tramadol, gabapentin, trazodone and norco without improvement. She denies having decreased urine output or bowel or bladder incontinence as associated symptoms. She admits that she was seen by her PCP today and had an MRI scheduled but doesn't know the appointment time. She also admits that she has an appointment with [place], Neurologist, next week.   Past Medical History  Diagnosis Date  . CHF (congestive heart failure)   . Diabetes mellitus   . Restless leg syndrome 07/25/2011  . Wound, open     right foot  . Diabetes mellitus with neuropathy 07/25/2011  . Cellulitis and abscess of foot 02/12/2012  . Osteomyelitis of right foot 02/14/2012  . Partial Achilles tendon tear 02/14/2012  . Dysrhythmia     tachy- takes  Metoprolol  . OSA (obstructive sleep apnea) 07/25/2011    not using CPAP  . Arthritis     knees  . Anemia    Past Surgical History  Procedure Laterality Date  . Abdominal hysterectomy    . Achilles tendon repair    . Toe amputation      partial rt great toe  . Below knee leg amputation Right 11/25/2012    Dr Sharol Given  . Amputation Right 11/25/2012    Procedure: AMPUTATION BELOW KNEE;  Surgeon: Newt Minion, MD;  Location: [place];  Service: Orthopedics;  Laterality: Right;  Right Below Knee Amputation  . Breast surgery Left     Lumpectomy non ca   Family History  Problem Relation Age of Onset  . Diabetes Sister    History  Substance Use Topics  . Smoking status: Never Smoker   . Smokeless tobacco: Never Used  . Alcohol Use: No   No OB history provided.  Review of Systems  A complete 10 system review of systems was obtained and all systems are negative except as noted in the HPI and PMH.    Allergies  Review of patient's allergies indicates no known allergies.  Home Medications   Prior to Admission medications   Medication Sig Start Date End Date Taking? Authorizing Provider  aspirin EC 81 MG tablet Take 1 tablet (81 mg total) by mouth daily. 03/17/13  Yes Kathie Dike, MD  cholecalciferol (VITAMIN D) 1000 UNITS tablet Take 1,000 Units by mouth 2 (two) times daily.   Yes Historical Provider, MD  cilostazol (PLETAL) 100 MG tablet Take 100 mg by mouth 3 (three) times daily. 02/15/13  Yes Historical Provider, MD  cyclobenzaprine (FLEXERIL) 5 MG tablet Take 5 mg by mouth 2 (two) times daily as needed. For pain/muscle relanxt 06/20/13  Yes Historical Provider, MD  DULoxetine (CYMBALTA) 60 MG capsule Take 60 mg by mouth at bedtime as needed (for mood/nerves).  08/22/13  Yes Historical Provider, MD  Ferrous Fumarate-Vitamin C 65-125 MG TABS Take 125 mg by mouth 5 (five) times daily.   Yes Historical Provider, MD  gabapentin (NEURONTIN) 300 MG capsule Take 600 mg by mouth 3 (three)  times daily. 03/28/13  Yes Historical Provider, MD  ibuprofen (ADVIL,MOTRIN) 200 MG tablet Take 200 mg by mouth every 6 (six) hours as needed.   Yes Historical Provider, MD  insulin lispro protamine-lispro (HUMALOG 50/50) (50-50) 100 UNIT/ML SUSP injection Inject 55-90 Units into the skin 3 (three) times daily with meals. 80-90 units in the morning, 80-90 units in the afternoon, and 55 units at bedtime. Based on sliding scale   Yes Historical Provider, MD  Magnesium 100 MG TABS Take 100 mg by mouth daily.   Yes Historical Provider, MD  metoprolol (LOPRESSOR) 50 MG tablet Take 50 mg by mouth 2 (two) times daily.   Yes Historical Provider, MD  Multiple Vitamin (MULTIVITAMIN WITH MINERALS) TABS Take 1 tablet by mouth once a week.    Yes Historical Provider, MD  pravastatin (PRAVACHOL) 20 MG tablet Take 20 mg by mouth at bedtime. 03/01/13  Yes Historical Provider, MD  rOPINIRole (REQUIP) 2 MG tablet Take 2 mg by mouth every evening.    Yes Historical Provider, MD  spironolactone (ALDACTONE) 25 MG tablet Take 25 mg by mouth daily. 02/15/13  Yes Historical Provider, MD  traMADol (ULTRAM) 50 MG tablet Take 50 mg by mouth 4 (four) times daily as needed for pain.    Yes Historical Provider, MD   Triage Vitals: BP 153/54  Pulse 91  Resp 20  Ht 5\' 10"  (1.778 m)  Wt 290 lb (131.543 kg)  BMI 41.61 kg/m2  SpO2 97%  LMP 07/25/2011  Physical Exam  Nursing note and vitals reviewed. Constitutional: She is oriented to person, place, and time. She appears well-developed. No distress.  Morbidly obese  HENT:  Head: Normocephalic and atraumatic.  Eyes: Conjunctivae and EOM are normal.  Neck: Normal range of motion. Neck supple. No tracheal deviation present.  Cardiovascular: Normal rate, regular rhythm and normal heart sounds.   No murmur heard. Pulmonary/Chest: Effort normal and breath sounds normal. No respiratory distress. She has no wheezes. She has no rales.  Abdominal: Soft. Bowel sounds are normal.  There is no tenderness.  Musculoskeletal: Normal range of motion. She exhibits no edema.  Tenderness to palpation in the soft tissue of lumbar region.  Neurological: She is alert and oriented to person, place, and time. No cranial nerve deficit.  Patellar reflexes are trace and equal bilaterally. She is status post BKA of right leg, but strength appears normal and symmetrical otherwise  Skin: Skin is warm and dry.  Psychiatric: She has a normal mood and affect. Her behavior is normal.    ED Course  Procedures (including critical care time)  Medications  HYDROmorphone (DILAUDID) injection 2 mg (2 mg Intramuscular Given 09/18/13 2326)    DIAGNOSTIC STUDIES: Oxygen Saturation is 97% on RA, adequate by my  interpretation.    COORDINATION OF CARE: 11:15 PM-Discussed treatment plan which includes shot of pain medication and making her MRI tomorrow with pt at bedside and pt agreed to plan. Advised pt to call [place] and try to bump up appointment.    Labs Review Labs Reviewed - No data to display  Imaging Review No results found.   EKG Interpretation None      MDM   Final diagnoses:  None    Patient presents with complaints of shooting pains in both legs. She is a diabetic and I suspect her symptoms are related to an undiagnosed diabetic neuropathy. She was given an injection of Dilaudid and is now feeling much better. She will be discharged with pain medication and followup with her primary Dr. to further discuss her situation. There is nothing on the history or physical exam that would suggest any emergent imaging or surgical consultation is indicated.   I personally performed the services described in this documentation, which was scribed in my presence. The recorded information has been reviewed and is accurate.      Veryl Speak, MD 09/19/13 [phone]

## 2013-09-18 NOTE — ED Notes (Signed)
Patient reports bilateral leg pain for 20 years, worsened over the past year. Patient states, "My nerves are getting short and it makes me short of breath."

## 2013-09-19 MED ORDER — OXYCODONE-ACETAMINOPHEN 5-325 MG PO TABS: 2.0000 | ORAL_TABLET | ORAL | Status: DC | PRN

## 2013-09-19 NOTE — Discharge Instructions (Signed)
Percocet as prescribed as needed for pain.  Return for an MRI of your lumbar spine at the arranged time.   Musculoskeletal Pain Musculoskeletal pain is muscle and boney aches and pains. These pains can occur in any part of the body. Your caregiver may treat you without knowing the cause of the pain. They may treat you if blood or urine tests, X-rays, and other tests were normal.  CAUSES There is often not a definite cause or reason for these pains. These pains may be caused by a type of germ (virus). The discomfort may also come from overuse. Overuse includes working out too hard when your body is not fit. Boney aches also come from weather changes. Bone is sensitive to atmospheric pressure changes. HOME CARE INSTRUCTIONS   Ask when your test results will be ready. Make sure you get your test results.  Only take over-the-counter or prescription medicines for pain, discomfort, or fever as directed by your caregiver. If you were given medications for your condition, do not drive, operate machinery or power tools, or sign legal documents for 24 hours. Do not drink alcohol. Do not take sleeping pills or other medications that may interfere with treatment.  Continue all activities unless the activities cause more pain. When the pain lessens, slowly resume normal activities. Gradually increase the intensity and duration of the activities or exercise.  During periods of severe pain, bed rest may be helpful. Lay or sit in any position that is comfortable.  Putting ice on the injured area.  Put ice in a bag.  Place a towel between your skin and the bag.  Leave the ice on for 15 to 20 minutes, 3 to 4 times a day.  Follow up with your caregiver for continued problems and no reason can be found for the pain. If the pain becomes worse or does not go away, it may be necessary to repeat tests or do additional testing. Your caregiver may need to look further for a possible cause. SEEK IMMEDIATE MEDICAL  CARE IF:  You have pain that is getting worse and is not relieved by medications.  You develop chest pain that is associated with shortness or breath, sweating, feeling sick to your stomach (nauseous), or throw up (vomit).  Your pain becomes localized to the abdomen.  You develop any new symptoms that seem different or that concern you. MAKE SURE YOU:   Understand these instructions.  Will watch your condition.  Will get help right away if you are not doing well or get worse. Document Released: 05/17/2005 Document Revised: 08/09/2011 Document Reviewed: 01/19/2013 [facility] Patient Information 2014 [employer].

## 2013-09-19 NOTE — ED Notes (Signed)
Pt alert & oriented x4, stable gait. Patient given discharge instructions, paperwork & prescription(s). Patient  instructed to stop at the registration desk to finish any additional paperwork. Patient verbalized understanding. Pt left department w/ no further questions. 

## 2013-09-21 ENCOUNTER — Ambulatory Visit (HOSPITAL_COMMUNITY)
Admission: RE | Admit: 2013-09-21 | Discharge: 2013-09-21 | Disposition: A | Discharge status: Home / Self Care | Payer: Medicare HMO | Source: Ambulatory Visit | Attending: Internal Medicine | Admitting: Internal Medicine

## 2013-09-21 ENCOUNTER — Ambulatory Visit (HOSPITAL_COMMUNITY): Payer: Medicare HMO

## 2013-09-21 DIAGNOSIS — E119 Type 2 diabetes mellitus without complications: Secondary | ICD-10-CM | POA: Insufficient documentation

## 2013-09-21 DIAGNOSIS — IMO0001 Reserved for inherently not codable concepts without codable children: Secondary | ICD-10-CM | POA: Insufficient documentation

## 2013-09-21 DIAGNOSIS — I10 Essential (primary) hypertension: Secondary | ICD-10-CM | POA: Insufficient documentation

## 2013-09-21 DIAGNOSIS — S91109A Unspecified open wound of unspecified toe(s) without damage to nail, initial encounter: Secondary | ICD-10-CM | POA: Insufficient documentation

## 2013-09-21 NOTE — Progress Notes (Signed)
Physical Therapy - Wound Therapy  Evaluation   Patient Details  Name: Mary [name] MRN: [id number] Date of Birth: September 26, 1945  Today's Date: 09/21/2013 Time: [id number] Time Calculation (min): 25 min  Visit#: 1 of 8  Re-eval: 10/21/13  Subjective Subjective Assessment Subjective: Pt states that she had a wound come up on the anterior aspect of her Lt big toe last Saturday.  She states tha wound is not healing which made her nervous due to the fact that this is how her Rt toe started which lead to a toe ampuation and then a leg amputation . Patient and Family Stated Goals: wound to heal Date of Onset: 09/15/13 Prior Treatments: neosporin; betadine.  Pain Assessment Pain Assessment Pain Assessment: No/denies pain  Wound Therapy Wound 02/12/12 Diabetic ulcer Heel Posterior;Left Large open wound, foul smell, beefy red tissue with serosangious drainage.   (Active)     Wound 10/08/12 Diabetic ulcer Heel Right 3cmx4cm Diabetic ulcer (Active)     Wound 10/08/12 Other (Comment) Toe (Comment  which one) Right scabbed area - healing blister from wearing cast previously (Active)     Wound / Incision (Open or Dehisced) 09/21/13 Other (Comment) Other (Comment) Left Lt anterior toe (Active)  Dressing Type Honeycomb;Alginate 09/21/2013  5:02 PM  Dressing Changed New 09/21/2013  5:02 PM  Dressing Status Clean 09/21/2013  5:02 PM  Dressing Change Frequency Every 3 days 09/21/2013  5:02 PM  Site / Wound Assessment Clean 09/21/2013  5:02 PM  % Wound base Red or Granulating 30% 09/21/2013  5:02 PM  % Wound base Yellow 75% 09/21/2013  5:02 PM  % Wound base Black 0% 09/21/2013  5:02 PM  % Wound base Other (Comment) 0% 09/21/2013  5:02 PM  Peri-wound Assessment Intact 09/21/2013  5:02 PM  Wound Length (cm) 0.5 cm 09/21/2013  5:02 PM  Wound Width (cm) 1.2 cm 09/21/2013  5:02 PM  Wound Depth (cm) 0.2 cm 09/21/2013  5:02 PM  Margins Unattached edges (unapproximated) 09/21/2013  5:02 PM  Closure None 09/21/2013   5:02 PM  Drainage Amount Minimal 09/21/2013  5:02 PM  Drainage Description Serous 09/21/2013  5:02 PM  Non-staged Wound Description Not applicable [id number]  [id number] PM  Treatment Cleansed;Debridement (Selective) 09/21/2013  5:02 PM     Incision 11/25/12 Leg Right (Active)   Selective Debridement Selective Debridement - Location: entire wound bed Selective Debridement - Tools Used: Forceps Selective Debridement - Tissue Removed: slough   Physical Therapy Assessment and Plan Wound Therapy - Assess/Plan/Recommendations Wound Therapy - Clinical Statement: Pt is a 68 yo diabetic with a previous ampuation of her Rt leg who developed a wound on the anterior aspect of her toe which is not showing any signs for healing.  Due to hx of ampuation, uncontroled diabetic and decrease sensation pt will benefit from skilled therapy to ensure proper healing of wound. Wound Therapy - Functional Problem List: pt was getting in the pool at the [facility] to promote weight loss, activity and decrease back pain.  She is no longer able to do this. Factors Delaying/Impairing Wound Healing: Diabetes Mellitus;Multiple medical problems;Polypharmacy Hydrotherapy Plan: Debridement;Dressing change;Patient/family education Wound Therapy - Frequency:  (2x week) Wound Therapy - Current Recommendations: PT Wound Plan: Pt to be seen two times a week for four weeks for debriedment and dressing change.      Goals Wound Therapy Goals - Improve the function of patient's integumentary system by progressing the wound(s) through the phases of wound healing by: Decrease Necrotic Tissue to: 0 Decrease Necrotic  Tissue - Progress: Goal set today Increase Granulation Tissue to: 100 Increase Granulation Tissue - Progress: Goal set today Decrease Length/Width/Depth by (cm): healed Decrease Length/Width/Depth - Progress: Goal set today Improve Drainage Characteristics:  (none) Improve Drainage Characteristics - Progress: Goal set  today Patient/Family will be able to : verbalize the importance of proper fitting shoes.(diabetic) Additional Wound Therapy Goal: Pt to go back to pool therapy to decrease weight, decrease back pain and increase mobility Additional Wound Therapy Goal - Progress: Goal set today Time For Goal Achievement:  (4 weeks) Wound Therapy - Potential for Goals: Good  Problem List Patient Active Problem List   Diagnosis Date Noted  . Acute encephalopathy 03/16/2013  . Acute on chronic renal failure 03/16/2013  . Hypoglycemia 03/16/2013  . CHF (congestive heart failure)   . Unstable balance 01/16/2013  . Difficulty in walking(719.7) 01/16/2013  . Infected wound 01/16/2013  . Hx of right BKA 12/23/2012  . Candidiasis of breast 12/08/2012  . S/P bilateral BKA (below knee amputation) 11/30/2012  . Diabetes 11/30/2012  . Diabetic foot ulcer 10/08/2012  . UTI (lower urinary tract infection) 10/07/2012  . Bacteremia due to Staphylococcus aureus 10/07/2012  . Anxiety 02/17/2012  . Osteomyelitis of right foot 02/14/2012  . Hematuria, microscopic 02/13/2012  . Hyponatremia 02/13/2012  . PVD (peripheral vascular disease) 02/08/2012  . Open wound of heel 02/08/2012  . Hyperlipidemia 11/11/2011  . Hypertension 11/11/2011  . Morbid obesity 11/11/2011  . Tachycardia 08/04/2011  . Acute exacerbation of CHF (congestive heart failure) 07/25/2011  . Diabetes mellitus with neuropathy 07/25/2011  . OSA (obstructive sleep apnea) 07/25/2011  . Acute bronchitis 07/25/2011  . Generalized weakness 07/25/2011  . Acute respiratory failure 07/25/2011  . Restless leg syndrome 07/25/2011  . Anemia 07/25/2011    GP Functional Assessment Tool Used: clinical judgement Functional Limitation: Other PT primary Other PT Primary Current Status [id number]): At least 20 percent but less than 40 percent impaired, limited or restricted Other PT Primary Goal Status [id number]): 0 percent impaired, limited or restricted  Leeroy Cha 09/21/2013, 5:11 PM Your signature is required to indicate approval of the treatment plan as stated above.  Please sign and return making a copy for your files.  You may hard copy or send electronically.  Please check one: ___1.  Approve of this plan  ___2.  Approve of this plan with the following changes.   ____________________________                             _____________ Physician                                                                      Date

## 2013-09-22 ENCOUNTER — Emergency Department (HOSPITAL_COMMUNITY)
Admission: EM | Admit: 2013-09-22 | Discharge: 2013-09-22 | Disposition: A | Discharge status: Home / Self Care | Payer: Medicare HMO | Attending: Emergency Medicine | Admitting: Emergency Medicine

## 2013-09-22 ENCOUNTER — Encounter (HOSPITAL_COMMUNITY): Payer: Self-pay | Admitting: Emergency Medicine

## 2013-09-22 DIAGNOSIS — Z8669 Personal history of other diseases of the nervous system and sense organs: Secondary | ICD-10-CM | POA: Insufficient documentation

## 2013-09-22 DIAGNOSIS — I509 Heart failure, unspecified: Secondary | ICD-10-CM | POA: Insufficient documentation

## 2013-09-22 DIAGNOSIS — E1149 Type 2 diabetes mellitus with other diabetic neurological complication: Secondary | ICD-10-CM | POA: Insufficient documentation

## 2013-09-22 DIAGNOSIS — M129 Arthropathy, unspecified: Secondary | ICD-10-CM | POA: Insufficient documentation

## 2013-09-22 DIAGNOSIS — R000 Tachycardia, unspecified: Secondary | ICD-10-CM | POA: Insufficient documentation

## 2013-09-22 DIAGNOSIS — G4733 Obstructive sleep apnea (adult) (pediatric): Secondary | ICD-10-CM | POA: Insufficient documentation

## 2013-09-22 DIAGNOSIS — Z7982 Long term (current) use of aspirin: Secondary | ICD-10-CM | POA: Insufficient documentation

## 2013-09-22 DIAGNOSIS — M545 Low back pain, unspecified: Secondary | ICD-10-CM

## 2013-09-22 DIAGNOSIS — Z794 Long term (current) use of insulin: Secondary | ICD-10-CM | POA: Insufficient documentation

## 2013-09-22 DIAGNOSIS — D649 Anemia, unspecified: Secondary | ICD-10-CM | POA: Insufficient documentation

## 2013-09-22 DIAGNOSIS — Z79899 Other long term (current) drug therapy: Secondary | ICD-10-CM | POA: Insufficient documentation

## 2013-09-22 DIAGNOSIS — E1142 Type 2 diabetes mellitus with diabetic polyneuropathy: Secondary | ICD-10-CM | POA: Insufficient documentation

## 2013-09-22 DIAGNOSIS — G8929 Other chronic pain: Secondary | ICD-10-CM | POA: Insufficient documentation

## 2013-09-22 DIAGNOSIS — Z872 Personal history of diseases of the skin and subcutaneous tissue: Secondary | ICD-10-CM | POA: Insufficient documentation

## 2013-09-22 MED ORDER — ONDANSETRON 4 MG PO TBDP
4.0000 mg | ORAL_TABLET | Freq: Once | ORAL | Status: AC
  Administered 2013-09-22: 4 mg via ORAL
  Filled 2013-09-22: qty 1

## 2013-09-22 MED ORDER — OXYCODONE-ACETAMINOPHEN 5-325 MG PO TABS: 1.0000 | ORAL_TABLET | Freq: Four times a day (QID) | ORAL | Status: DC | PRN

## 2013-09-22 MED ORDER — HYDROMORPHONE HCL PF 1 MG/ML IJ SOLN
1.0000 mg | Freq: Once | INTRAMUSCULAR | Status: AC
  Administered 2013-09-22: 1 mg via INTRAMUSCULAR
  Filled 2013-09-22: qty 1

## 2013-09-22 NOTE — Discharge Instructions (Signed)
We are giving you medication until you can see Dr. Merlene Laughter on Thursday. Do not take the medication if you are doing any activity that may cause injury as the medication will make you sleepy.

## 2013-09-22 NOTE — ED Provider Notes (Signed)
Medical screening examination/treatment/procedure(s) were conducted as a shared visit with non-physician practitioner(s) and myself.  I personally evaluated the patient during the encounter.   EKG Interpretation None     Low back pain with no radicular symptoms.  No abdominal pain. Patient has followup with neurologist next week  Nat Christen, MD 09/22/13 [phone]

## 2013-09-22 NOTE — ED Provider Notes (Signed)
CSN: [id number]     Arrival date & time 09/22/13  0815 History   First MD Initiated Contact with Patient 09/22/13 [phone]     Chief Complaint  Patient presents with  . Back Pain     (Consider location/radiation/quality/duration/timing/severity/associated sxs/prior Treatment) Patient is a 68 y.o. female presenting with back pain. The history is provided by the patient.  Back Pain Location:  Unable to specify Quality:  Unable to specify Radiates to:  Does not radiate Pain severity:  Severe Pain is:  Same all the time Onset quality:  Gradual Duration:  2 weeks Timing:  Constant Progression:  Worsening Chronicity:  Chronic Relieved by:  Nothing Worsened by:  Ambulation Ineffective treatments: Tramadol. Associated symptoms: no abdominal pain, no bladder incontinence, no bowel incontinence, no chest pain, no dysuria, no fever and no headaches    Mary [name] is a 68 y.o. female who presents to the ED with low back pain that started 2 weeks ago. She has a history of chronic pain. She sees Dr. Nevada Crane for her general health. Dr. Nevada Crane has told the patient that he will not prescribe any more narcotics. She has an appointment to see Dr. Merlene Laughter for pain management but the appointment is not for 5 days. She is out of the 20 Percocet that she was given her 4 days ago.   Past Medical History  Diagnosis Date  . CHF (congestive heart failure)   . Diabetes mellitus   . Restless leg syndrome 07/25/2011  . Wound, open     right foot  . Diabetes mellitus with neuropathy 07/25/2011  . Cellulitis and abscess of foot 02/12/2012  . Osteomyelitis of right foot 02/14/2012  . Partial Achilles tendon tear 02/14/2012  . Dysrhythmia     tachy- takes Metoprolol  . OSA (obstructive sleep apnea) 07/25/2011    not using CPAP  . Arthritis     knees  . Anemia    Past Surgical History  Procedure Laterality Date  . Abdominal hysterectomy    . Achilles tendon repair    . Toe amputation      partial rt great  toe  . Below knee leg amputation Right 11/25/2012    Dr Sharol Given  . Amputation Right 11/25/2012    Procedure: AMPUTATION BELOW KNEE;  Surgeon: Newt Minion, MD;  Location: [facility];  Service: Orthopedics;  Laterality: Right;  Right Below Knee Amputation  . Breast surgery Left     Lumpectomy non ca   Family History  Problem Relation Age of Onset  . Diabetes Sister    History  Substance Use Topics  . Smoking status: Never Smoker   . Smokeless tobacco: Never Used  . Alcohol Use: No   OB History   Grav Para Term Preterm Abortions TAB SAB Ect Mult Living                 Review of Systems  Constitutional: Negative for fever and chills.  HENT: Negative.   Eyes: Negative for visual disturbance.  Respiratory: Negative for shortness of breath.   Cardiovascular: Negative for chest pain.  Gastrointestinal: Negative for nausea, vomiting, abdominal pain and bowel incontinence.  Genitourinary: Negative for bladder incontinence, dysuria, urgency, frequency and hematuria.  Musculoskeletal: Positive for back pain.  Skin: Negative for wound.  Neurological: Negative for headaches.  Psychiatric/Behavioral: Negative for confusion.      Allergies  Review of patient's allergies indicates no known allergies.  Home Medications   Prior to Admission medications   Medication Sig  Start Date End Date Taking? Authorizing Provider  aspirin EC 81 MG tablet Take 1 tablet (81 mg total) by mouth daily. 03/17/13   Kathie Dike, MD  cholecalciferol (VITAMIN D) 1000 UNITS tablet Take 1,000 Units by mouth 2 (two) times daily.    Historical Provider, MD  cilostazol (PLETAL) 100 MG tablet Take 100 mg by mouth 3 (three) times daily. 02/15/13   Historical Provider, MD  cyclobenzaprine (FLEXERIL) 5 MG tablet Take 5 mg by mouth 2 (two) times daily as needed. For pain/muscle relanxt 06/20/13   Historical Provider, MD  DULoxetine (CYMBALTA) 60 MG capsule Take 60 mg by mouth at bedtime as needed (for mood/nerves).  08/22/13    Historical Provider, MD  Ferrous Fumarate-Vitamin C 65-125 MG TABS Take 125 mg by mouth 5 (five) times daily.    Historical Provider, MD  gabapentin (NEURONTIN) 300 MG capsule Take 600 mg by mouth 3 (three) times daily. 03/28/13   Historical Provider, MD  ibuprofen (ADVIL,MOTRIN) 200 MG tablet Take 200 mg by mouth every 6 (six) hours as needed.    Historical Provider, MD  insulin lispro protamine-lispro (HUMALOG 50/50) (50-50) 100 UNIT/ML SUSP injection Inject 55-90 Units into the skin 3 (three) times daily with meals. 80-90 units in the morning, 80-90 units in the afternoon, and 55 units at bedtime. Based on sliding scale    Historical Provider, MD  Magnesium 100 MG TABS Take 100 mg by mouth daily.    Historical Provider, MD  metoprolol (LOPRESSOR) 50 MG tablet Take 50 mg by mouth 2 (two) times daily.    Historical Provider, MD  Multiple Vitamin (MULTIVITAMIN WITH MINERALS) TABS Take 1 tablet by mouth once a week.     Historical Provider, MD  oxyCODONE-acetaminophen (PERCOCET) 5-325 MG per tablet Take 2 tablets by mouth every 4 (four) hours as needed. 09/19/13   Veryl Speak, MD  pravastatin (PRAVACHOL) 20 MG tablet Take 20 mg by mouth at bedtime. 03/01/13   Historical Provider, MD  rOPINIRole (REQUIP) 2 MG tablet Take 2 mg by mouth every evening.     Historical Provider, MD  spironolactone (ALDACTONE) 25 MG tablet Take 25 mg by mouth daily. 02/15/13   Historical Provider, MD  traMADol (ULTRAM) 50 MG tablet Take 50 mg by mouth 4 (four) times daily as needed for pain.     Historical Provider, MD   BP 126/73  Pulse 102  Temp(Src) 98.7 F (37.1 C) (Oral)  Resp 20  Ht 5\' 10"  (1.778 m)  Wt 285 lb (129.275 kg)  BMI 40.89 kg/m2  SpO2 100%  LMP 07/25/2011 Physical Exam  Nursing note and vitals reviewed. Constitutional: She is oriented to person, place, and time. She appears well-developed and well-nourished. No distress.  HENT:  Head: Normocephalic and atraumatic.  Right Ear: Tympanic membrane  normal.  Left Ear: Tympanic membrane normal.  Nose: Nose normal.  Mouth/Throat: Uvula is midline, oropharynx is clear and moist and mucous membranes are normal.  Eyes: EOM are normal.  Neck: Normal range of motion. Neck supple.  Cardiovascular: Regular rhythm.  Tachycardia present.   Pulmonary/Chest: Effort normal. She has no wheezes. She has no rales.  Abdominal: Soft. Bowel sounds are normal. There is no tenderness.  Musculoskeletal: Normal range of motion.       Lumbar back: She exhibits tenderness, pain and spasm. She exhibits normal pulse.  Neurological: She is alert and oriented to person, place, and time. She has normal strength. No cranial nerve deficit or sensory deficit.  Reflex Scores:  Bicep reflexes are 2+ on the right side and 2+ on the left side.      Brachioradialis reflexes are 2+ on the right side and 2+ on the left side.      Patellar reflexes are 2+ on the left side.      Achilles reflexes are 2+ on the left side. BKA right   Skin: Skin is warm and dry.  Psychiatric: She has a normal mood and affect. Her behavior is normal.    ED Course  Procedures Dilaudid 1 mg. IM, Zofran 4 mg ODT  MDM  68 y.o. female with chronic low back pain. Stable for discharge to follow up with Dr. Merlene Laughter as schedule. Discussed with patient that we will give medication until she can follow up with Dr. Merlene Laughter on Thursday 09/27/2013. She agrees to plan. Stable for discharge.    Medication List    TAKE these medications       oxyCODONE-acetaminophen 5-325 MG per tablet  Commonly known as:  ROXICET  Take 1-2 tablets by mouth every 6 (six) hours as needed for severe pain.      ASK your doctor about these medications       aspirin EC 81 MG tablet  Take 1 tablet (81 mg total) by mouth daily.     cholecalciferol 1000 UNITS tablet  Commonly known as:  VITAMIN D  Take 1,000 Units by mouth 2 (two) times daily.     cilostazol 100 MG tablet  Commonly known as:  PLETAL  Take 100  mg by mouth 3 (three) times daily.     cyclobenzaprine 5 MG tablet  Commonly known as:  FLEXERIL  Take 5 mg by mouth 2 (two) times daily as needed. For pain/muscle relanxt     DULoxetine 60 MG capsule  Commonly known as:  CYMBALTA  Take 60 mg by mouth at bedtime as needed (for mood/nerves).     Ferrous Fumarate-Vitamin C 65-125 MG Tabs  Take 125 mg by mouth 5 (five) times daily.     gabapentin 300 MG capsule  Commonly known as:  NEURONTIN  Take 600 mg by mouth 3 (three) times daily.     ibuprofen 200 MG tablet  Commonly known as:  ADVIL,MOTRIN  Take 200 mg by mouth every 6 (six) hours as needed.     insulin lispro protamine-lispro (50-50) 100 UNIT/ML Susp injection  Commonly known as:  HUMALOG 50/50 MIX  Inject 55-90 Units into the skin 3 (three) times daily with meals. 80-90 units in the morning, 80-90 units in the afternoon, and 55 units at bedtime. Based on sliding scale     Magnesium 100 MG Tabs  Take 100 mg by mouth daily.     metoprolol 50 MG tablet  Commonly known as:  LOPRESSOR  Take 50 mg by mouth 2 (two) times daily.     multivitamin with minerals Tabs tablet  Take 1 tablet by mouth once a week.     pravastatin 20 MG tablet  Commonly known as:  PRAVACHOL  Take 20 mg by mouth at bedtime.     rOPINIRole 2 MG tablet  Commonly known as:  REQUIP  Take 2 mg by mouth every evening.     spironolactone 25 MG tablet  Commonly known as:  ALDACTONE  Take 25 mg by mouth daily.     traMADol 50 MG tablet  Commonly known as:  ULTRAM  Take 50 mg by mouth 4 (four) times daily as needed for pain.  [facility], [place] 09/22/13 1622

## 2013-09-22 NOTE — ED Notes (Signed)
Pt returns to er for further evaluation of back pain, was seen in er on Tuesday 09/19/2013 for back pain, has MRI scheduled but pt was canceled due to insurance refusing to cover MRI, has not spoken to Dr Merlene Laughter who pt was suppose to follow up with, has spoken to Dr Nevada Crane who is pt's pcp but was advised by Dr Nevada Crane that he would not write for additional narcotics, denies any new injury, denies any radiation to legs today,

## 2013-09-24 ENCOUNTER — Ambulatory Visit (HOSPITAL_COMMUNITY)
Admission: RE | Admit: 2013-09-24 | Discharge: 2013-09-24 | Disposition: A | Discharge status: Home / Self Care | Payer: Medicare HMO | Source: Ambulatory Visit | Attending: Internal Medicine | Admitting: Internal Medicine

## 2013-09-24 NOTE — Progress Notes (Signed)
Physical Therapy - Wound Therapy  Treatment   Patient Details  Name: Mary [name] MRN: [id number] Date of Birth: 1946/01/05  Today's Date: 09/24/2013 Time: 0805-0830 Time Calculation (min): 25 min Charges: Selective debridement (= or < 20 cm)   Visit#: 2 of 8  Re-eval: 10/21/13  Subjective Subjective Assessment Subjective: Pt states that bandage came off but she kept area covered with a band-aid.   Pain Assessment Pain Assessment Pain Assessment: No/denies pain  Wound Therapy Wound 02/12/12 Diabetic ulcer Heel Posterior;Left Large open wound, foul smell, beefy red tissue with serosangious drainage.   (Active)     Wound 10/08/12 Diabetic ulcer Heel Right 3cmx4cm Diabetic ulcer (Active)     Wound 10/08/12 Other (Comment) Toe (Comment  which one) Right scabbed area - healing blister from wearing cast previously (Active)     Wound / Incision (Open or Dehisced) 09/21/13 Other (Comment) Other (Comment) Left Lt anterior toe (Active)  Dressing Type Other (Comment) 09/24/2013  8:37 AM  Dressing Changed New 09/21/2013  5:02 PM  Dressing Status Clean 09/24/2013  8:37 AM  Dressing Change Frequency Every 3 days 09/24/2013  8:37 AM  Site / Wound Assessment Clean 09/24/2013  8:37 AM  % Wound base Red or Granulating 30% 09/24/2013  8:37 AM  % Wound base Yellow 75% 09/24/2013  8:37 AM  % Wound base Black 0% 09/24/2013  8:37 AM  % Wound base Other (Comment) 0% 09/24/2013  8:37 AM  Peri-wound Assessment Intact 09/24/2013  8:37 AM  Wound Length (cm) 0.5 cm 09/21/2013  5:02 PM  Wound Width (cm) 1.2 cm 09/21/2013  5:02 PM  Wound Depth (cm) 0.2 cm 09/21/2013  5:02 PM  Margins Unattached edges (unapproximated) 09/24/2013  8:37 AM  Closure None 09/24/2013  8:37 AM  Drainage Amount Minimal 09/24/2013  8:37 AM  Drainage Description Serous 09/24/2013  8:37 AM  Non-staged Wound Description Not applicable [id number]  [id number] AM  Treatment Cleansed;Debridement (Selective) 09/24/2013  8:37 AM     Incision 11/25/12  Leg Right (Active)   Selective Debridement Selective Debridement - Location: entire wound bed Selective Debridement - Tools Used: Forceps;Scalpel Selective Debridement - Tissue Removed: slough   Physical Therapy Assessment and Plan Wound Therapy - Assess/Plan/Recommendations Wound Therapy - Clinical Statement: Wound appears to be progressing well. Mild erythema noted at wound perimeter. Slough is adherent and difficult to remove. Pt tolerates debridement well. Vaseline applied to periwound to protect skin integrity. Wound Plan: Continue wound care per PT POC.  Problem List Patient Active Problem List   Diagnosis Date Noted  . Acute encephalopathy 03/16/2013  . Acute on chronic renal failure 03/16/2013  . Hypoglycemia 03/16/2013  . CHF (congestive heart failure)   . Unstable balance 01/16/2013  . Difficulty in walking(719.7) 01/16/2013  . Infected wound 01/16/2013  . Hx of right BKA 12/23/2012  . Candidiasis of breast 12/08/2012  . S/P bilateral BKA (below knee amputation) 11/30/2012  . Diabetes 11/30/2012  . Diabetic foot ulcer 10/08/2012  . UTI (lower urinary tract infection) 10/07/2012  . Bacteremia due to Staphylococcus aureus 10/07/2012  . Anxiety 02/17/2012  . Osteomyelitis of right foot 02/14/2012  . Hematuria, microscopic 02/13/2012  . Hyponatremia 02/13/2012  . PVD (peripheral vascular disease) 02/08/2012  . Open wound of heel 02/08/2012  . Hyperlipidemia 11/11/2011  . Hypertension 11/11/2011  . Morbid obesity 11/11/2011  . Tachycardia 08/04/2011  . Acute exacerbation of CHF (congestive heart failure) 07/25/2011  . Diabetes mellitus with neuropathy 07/25/2011  . OSA (obstructive sleep apnea) 07/25/2011  .  Acute bronchitis 07/25/2011  . Generalized weakness 07/25/2011  . Acute respiratory failure 07/25/2011  . Restless leg syndrome 07/25/2011  . Anemia 07/25/2011    Rachelle Hora, PTA  09/24/2013, 8:41 AM

## 2013-09-25 ENCOUNTER — Ambulatory Visit (HOSPITAL_COMMUNITY): Payer: Medicare HMO

## 2013-09-26 ENCOUNTER — Other Ambulatory Visit: Payer: Self-pay | Admitting: Adult Health

## 2013-09-27 ENCOUNTER — Encounter (HOSPITAL_COMMUNITY): Payer: Self-pay | Admitting: Pharmacy Technician

## 2013-10-01 ENCOUNTER — Other Ambulatory Visit: Payer: Self-pay | Admitting: Neurology

## 2013-10-01 ENCOUNTER — Ambulatory Visit (HOSPITAL_COMMUNITY): Payer: Medicare HMO | Admitting: *Deleted

## 2013-10-01 DIAGNOSIS — IMO0002 Reserved for concepts with insufficient information to code with codable children: Secondary | ICD-10-CM

## 2013-10-02 ENCOUNTER — Encounter (HOSPITAL_COMMUNITY): Payer: Self-pay

## 2013-10-02 ENCOUNTER — Encounter (HOSPITAL_COMMUNITY)
Admission: RE | Admit: 2013-10-02 | Discharge: 2013-10-02 | Disposition: A | Discharge status: Home / Self Care | Payer: Medicare HMO | Source: Ambulatory Visit | Attending: Ophthalmology | Admitting: Ophthalmology

## 2013-10-02 DIAGNOSIS — Z01812 Encounter for preprocedural laboratory examination: Secondary | ICD-10-CM | POA: Insufficient documentation

## 2013-10-02 HISTORY — DX: Other complications of anesthesia, initial encounter: T88.59XA

## 2013-10-02 HISTORY — DX: Adverse effect of unspecified anesthetic, initial encounter: T41.45XA

## 2013-10-02 LAB — HEMOGLOBIN AND HEMATOCRIT, BLOOD
HEMATOCRIT: 31.4 % — AB (ref 36.0–46.0)
Hemoglobin: 10.6 g/dL — ABNORMAL LOW (ref 12.0–15.0)

## 2013-10-02 LAB — BASIC METABOLIC PANEL
BUN: 31 mg/dL — ABNORMAL HIGH (ref 6–23)
CHLORIDE: 101 meq/L (ref 96–112)
CO2: 30 meq/L (ref 19–32)
Calcium: 9 mg/dL (ref 8.4–10.5)
Creatinine, Ser: 1.31 mg/dL — ABNORMAL HIGH (ref 0.50–1.10)
GFR calc Af Amer: 48 mL/min — ABNORMAL LOW (ref >90)
GFR calc non Af Amer: 41 mL/min — ABNORMAL LOW (ref >90)
Glucose, Bld: 118 mg/dL — ABNORMAL HIGH (ref 70–99)
Potassium: 4.8 mEq/L (ref 3.7–5.3)
SODIUM: 140 meq/L (ref 137–147)

## 2013-10-02 NOTE — Patient Instructions (Addendum)
Your procedure is scheduled on:  10/09/2013  Report to [facility] at   7:00   AM.  Call this number if you have problems the morning of surgery: [phone]   Remember:   Do not eat or drink :After Midnight.    Take these medicines the morning of surgery with A SIP OF WATER: Cymbalta and metoprolol. Gabapentin, Flexeril, Oxycodone and Tramadol if needed.   Do not wear jewelry, make-up or nail polish.  Do not wear lotions, powders, or perfumes. You may wear deodorant.  Do not shave 48 hours prior to surgery.  Do not bring valuables to the hospital.  Contacts, dentures or bridgework may not be worn into surgery.  Patients discharged the day of surgery will not be allowed to drive home.  Name and phone number of your driver:    Please read over the following fact sheets that you were given: Pain Booklet, Surgical Site Infection Prevention, Anesthesia Post-op Instructions and Care and Recovery After Surgery  Cataract Surgery  A cataract is a clouding of the lens of the eye. When a lens becomes cloudy, vision is reduced based on the degree and nature of the clouding. Surgery may be needed to improve vision. Surgery removes the cloudy lens and usually replaces it with a substitute lens (intraocular lens, IOL). LET YOUR EYE DOCTOR KNOW ABOUT:  Allergies to food or medicine.   Medicines taken including herbs, eyedrops, over-the-counter medicines, and creams.   Use of steroids (by mouth or creams).   Previous problems with anesthetics or numbing medicine.   History of bleeding problems or blood clots.   Previous surgery.   Other health problems, including diabetes and kidney problems.   Possibility of pregnancy, if this applies.  RISKS AND COMPLICATIONS  Infection.   Inflammation of the eyeball (endophthalmitis) that can spread to both eyes (sympathetic ophthalmia).   Poor wound healing.   If an IOL is inserted, it can later fall out of proper position. This is very uncommon.    Clouding of the part of your eye that holds an IOL in place. This is called an "after-cataract." These are uncommon, but easily treated.  BEFORE THE PROCEDURE  Do not eat or drink anything except small amounts of water for 8 to 12 before your surgery, or as directed by your caregiver.   Unless you are told otherwise, continue any eyedrops you have been prescribed.   Talk to your primary caregiver about all other medicines that you take (both prescription and non-prescription). In some cases, you may need to stop or change medicines near the time of your surgery. This is most important if you are taking blood-thinning medicine.Do not stop medicines unless you are told to do so.   Arrange for someone to drive you to and from the procedure.   Do not put contact lenses in either eye on the day of your surgery.  PROCEDURE There is more than one method for safely removing a cataract. Your doctor can explain the differences and help determine which is best for you. Phacoemulsification surgery is the most common form of cataract surgery.  An injection is given behind the eye or eyedrops are given to make this a painless procedure.   A small cut (incision) is made on the edge of the clear, dome-shaped surface that covers the front of the eye (cornea).   A tiny probe is painlessly inserted into the eye. This device gives off ultrasound waves that soften and break up  the cloudy center of the lens. This makes it easier for the cloudy lens to be removed by suction.   An IOL may be implanted.   The normal lens of the eye is covered by a clear capsule. Part of that capsule is intentionally left in the eye to support the IOL.   Your surgeon may or may not use stitches to close the incision.  There are other forms of cataract surgery that require a larger incision and stiches to close the eye. This approach is taken in cases where the doctor feels that the cataract cannot be easily removed using  phacoemulsification. AFTER THE PROCEDURE  When an IOL is implanted, it does not need care. It becomes a permanent part of your eye and cannot be seen or felt.   Your doctor will schedule follow-up exams to check on your progress.   Review your other medicines with your doctor to see which can be resumed after surgery.   Use eyedrops or take medicine as prescribed by your doctor.  Document Released: 05/06/2011 Document Reviewed: 05/03/2011 [facility] Patient Information 2012 [place].  .Cataract Surgery Care After Refer to this sheet in the next few weeks. These instructions provide you with information on caring for yourself after your procedure. Your caregiver may also give you more specific instructions. Your treatment has been planned according to current medical practices, but problems sometimes occur. Call your caregiver if you have any problems or questions after your procedure.  HOME CARE INSTRUCTIONS   Avoid strenuous activities as directed by your caregiver.   Ask your caregiver when you can resume driving.   Use eyedrops or other medicines to help healing and control pressure inside your eye as directed by your caregiver.   Only take over-the-counter or prescription medicines for pain, discomfort, or fever as directed by your caregiver.   Do not to touch or rub your eyes.   You may be instructed to use a protective shield during the first few days and nights after surgery. If not, wear sunglasses to protect your eyes. This is to protect the eye from pressure or from being accidentally bumped.   Keep the area around your eye clean and dry. Avoid swimming or allowing water to hit you directly in the face while showering. Keep soap and shampoo out of your eyes.   Do not bend or lift heavy objects. Bending increases pressure in the eye. You can walk, climb stairs, and do light household chores.   Do not put a contact lens into the eye that had surgery until your caregiver  says it is okay to do so.   Ask your doctor when you can return to work. This will depend on the kind of work that you do. If you work in a dusty environment, you may be advised to wear protective eyewear for a period of time.   Ask your caregiver when it will be safe to engage in sexual activity.   Continue with your regular eye exams as directed by your caregiver.  What to expect:  It is normal to feel itching and mild discomfort for a few days after cataract surgery. Some fluid discharge is also common, and your eye may be sensitive to light and touch.   After 1 to 2 days, even moderate discomfort should disappear. In most cases, healing will take about 6 weeks.   If you received an intraocular lens (IOL), you may notice that colors are very bright or have a blue tinge.  Also, if you have been in bright sunlight, everything may appear reddish for a few hours. If you see these color tinges, it is because your lens is clear and no longer cloudy. Within a few months after receiving an IOL, these extra colors should go away. When you have healed, you will probably need new glasses.  SEEK MEDICAL CARE IF:   You have increased bruising around your eye.   You have discomfort not helped by medicine.  SEEK IMMEDIATE MEDICAL CARE IF:   You have a fever.   You have a worsening or sudden vision loss.   You have redness, swelling, or increasing pain in the eye.   You have a thick discharge from the eye that had surgery.  MAKE SURE YOU:  Understand these instructions.   Will watch your condition.   Will get help right away if you are not doing well or get worse.  Document Released: 12/04/2004 Document Revised: 05/06/2011 Document Reviewed: 01/08/2011 [facility] Patient Information 2012 [place]. Monitored Anesthesia Care  Monitored anesthesia care is an anesthesia service for a medical procedure. Anesthesia is the loss of the ability to feel pain. It is produced by medications called  anesthetics. It may affect a small area of your body (local anesthesia), a large area of your body (regional anesthesia), or your entire body (general anesthesia). The need for monitored anesthesia care depends your procedure, your condition, and the potential need for regional or general anesthesia. It is often provided during procedures where:   General anesthesia may be needed if there are complications. This is because you need special care when you are under general anesthesia.   You will be under local or regional anesthesia. This is so that you are able to have higher levels of anesthesia if needed.   You will receive calming medications (sedatives). This is especially the case if sedatives are given to put you in a semi-conscious state of relaxation (deep sedation). This is because the amount of sedative needed to produce this state can be hard to predict. Too much of a sedative can produce general anesthesia. Monitored anesthesia care is performed by one or more caregivers who have special training in all types of anesthesia. You will need to meet with these caregivers before your procedure. During this meeting, they will ask you about your medical history. They will also give you instructions to follow. (For example, you will need to stop eating and drinking before your procedure. You may also need to stop or change medications you are taking.) During your procedure, your caregivers will stay with you. They will:   Watch your condition. This includes watching you blood pressure, breathing, and level of pain.   Diagnose and treat problems that occur.   Give medications if they are needed. These may include calming medications (sedatives) and anesthetics.   Make sure you are comfortable.  Having monitored anesthesia care does not necessarily mean that you will be under anesthesia. It does mean that your caregivers will be able to manage anesthesia if you need it or if it occurs. It also  means that you will be able to have a different type of anesthesia than you are having if you need it. When your procedure is complete, your caregivers will continue to watch your condition. They will make sure any medications wear off before you are allowed to go home.  Document Released: 02/10/2005 Document Revised: 09/11/2012 Document Reviewed: 06/28/2012 [facility] Patient Information 2014 [place], [place].

## 2013-10-04 ENCOUNTER — Encounter (HOSPITAL_COMMUNITY): Payer: Self-pay | Admitting: Pharmacy Technician

## 2013-10-04 ENCOUNTER — Telehealth (HOSPITAL_COMMUNITY): Payer: Self-pay

## 2013-10-04 ENCOUNTER — Ambulatory Visit (HOSPITAL_COMMUNITY): Payer: Medicare HMO | Admitting: *Deleted

## 2013-10-04 ENCOUNTER — Emergency Department (HOSPITAL_COMMUNITY): Payer: Medicare HMO

## 2013-10-04 ENCOUNTER — Emergency Department (HOSPITAL_COMMUNITY)
Admission: EM | Admit: 2013-10-04 | Discharge: 2013-10-04 | Disposition: A | Discharge status: Home / Self Care | Payer: Medicare HMO | Attending: Emergency Medicine | Admitting: Emergency Medicine

## 2013-10-04 ENCOUNTER — Encounter (HOSPITAL_COMMUNITY): Payer: Self-pay | Admitting: Emergency Medicine

## 2013-10-04 DIAGNOSIS — M869 Osteomyelitis, unspecified: Secondary | ICD-10-CM | POA: Insufficient documentation

## 2013-10-04 DIAGNOSIS — G8929 Other chronic pain: Secondary | ICD-10-CM | POA: Insufficient documentation

## 2013-10-04 DIAGNOSIS — Z90710 Acquired absence of both cervix and uterus: Secondary | ICD-10-CM | POA: Insufficient documentation

## 2013-10-04 DIAGNOSIS — Z794 Long term (current) use of insulin: Secondary | ICD-10-CM | POA: Insufficient documentation

## 2013-10-04 DIAGNOSIS — I509 Heart failure, unspecified: Secondary | ICD-10-CM | POA: Insufficient documentation

## 2013-10-04 DIAGNOSIS — M5430 Sciatica, unspecified side: Secondary | ICD-10-CM | POA: Insufficient documentation

## 2013-10-04 DIAGNOSIS — Z7982 Long term (current) use of aspirin: Secondary | ICD-10-CM | POA: Insufficient documentation

## 2013-10-04 DIAGNOSIS — E1142 Type 2 diabetes mellitus with diabetic polyneuropathy: Secondary | ICD-10-CM | POA: Insufficient documentation

## 2013-10-04 DIAGNOSIS — Z79899 Other long term (current) drug therapy: Secondary | ICD-10-CM | POA: Insufficient documentation

## 2013-10-04 DIAGNOSIS — G2581 Restless legs syndrome: Secondary | ICD-10-CM | POA: Insufficient documentation

## 2013-10-04 DIAGNOSIS — Z872 Personal history of diseases of the skin and subcutaneous tissue: Secondary | ICD-10-CM | POA: Insufficient documentation

## 2013-10-04 DIAGNOSIS — S88119A Complete traumatic amputation at level between knee and ankle, unspecified lower leg, initial encounter: Secondary | ICD-10-CM | POA: Insufficient documentation

## 2013-10-04 DIAGNOSIS — D649 Anemia, unspecified: Secondary | ICD-10-CM | POA: Insufficient documentation

## 2013-10-04 DIAGNOSIS — M129 Arthropathy, unspecified: Secondary | ICD-10-CM | POA: Insufficient documentation

## 2013-10-04 DIAGNOSIS — I499 Cardiac arrhythmia, unspecified: Secondary | ICD-10-CM | POA: Insufficient documentation

## 2013-10-04 DIAGNOSIS — G4733 Obstructive sleep apnea (adult) (pediatric): Secondary | ICD-10-CM | POA: Insufficient documentation

## 2013-10-04 LAB — URINALYSIS, ROUTINE W REFLEX MICROSCOPIC
Bilirubin Urine: NEGATIVE
GLUCOSE, UA: 250 mg/dL — AB
Ketones, ur: NEGATIVE mg/dL
LEUKOCYTES UA: NEGATIVE
Nitrite: NEGATIVE
Urobilinogen, UA: 0.2 mg/dL (ref 0.0–1.0)
pH: 5.5 (ref 5.0–8.0)

## 2013-10-04 LAB — CBG MONITORING, ED: Glucose-Capillary: 250 mg/dL — ABNORMAL HIGH (ref 70–99)

## 2013-10-04 LAB — URINE MICROSCOPIC-ADD ON

## 2013-10-04 MED ORDER — ONDANSETRON 4 MG PO TBDP
4.0000 mg | ORAL_TABLET | Freq: Once | ORAL | Status: AC
  Administered 2013-10-04: 4 mg via ORAL
  Filled 2013-10-04: qty 1

## 2013-10-04 MED ORDER — HYDROMORPHONE HCL PF 2 MG/ML IJ SOLN
2.0000 mg | Freq: Once | INTRAMUSCULAR | Status: AC
Start: 2013-10-04 — End: 2013-10-04
  Administered 2013-10-04: 2 mg via INTRAMUSCULAR
  Filled 2013-10-04: qty 1

## 2013-10-04 MED ORDER — CYCLOBENZAPRINE HCL 5 MG PO TABS: 5.0000 mg | ORAL_TABLET | Freq: Two times a day (BID) | ORAL | Status: DC

## 2013-10-04 NOTE — ED Notes (Signed)
Pt complain of being nausea. States she is diabetic and has not had anything to eat today

## 2013-10-04 NOTE — ED Provider Notes (Signed)
CSN: [id number]     Arrival date & time 10/04/13  [id number] History   First MD Initiated Contact with Patient 10/04/13 0825     Chief Complaint  Patient presents with  . Groin Pain  . Tailbone Pain     (Consider location/radiation/quality/duration/timing/severity/associated sxs/prior Treatment) Patient is a 68 y.o. female presenting with groin pain. The history is provided by the patient.  Groin Pain This is a new problem. The current episode started today. The problem occurs constantly. Pertinent negatives include no abdominal pain, anorexia, chest pain, chills, coughing, fever, headaches, nausea, rash, urinary symptoms or vomiting.   Mary [name] is a 68 y.o. female who presents to the ED with pain in the right buttock that radiates into the right groin and back of right leg and knee. She has had the pain for the past 4 days but she woke this am and the pain was severe. She took Tramadol at 3 am and went back to sleep until 6 am.  She took Percocet about 6:30 am and still has the pain. She fell 2 weeks ago but did not hit her tailbone. She is a patient of Dr. Nevada Crane but she has not talked with him about the pain. She has been followed by Dr. Merlene Laughter for her back pain. He has ordered MRI for 5/18. She is also scheduled for eye surgery 5/12. Patient here recently for same.   Past Medical History  Diagnosis Date  . CHF (congestive heart failure)   . Diabetes mellitus   . Restless leg syndrome 07/25/2011  . Wound, open     right foot  . Diabetes mellitus with neuropathy 07/25/2011  . Cellulitis and abscess of foot 02/12/2012  . Osteomyelitis of right foot 02/14/2012  . Partial Achilles tendon tear 02/14/2012  . Dysrhythmia     tachy- takes Metoprolol  . OSA (obstructive sleep apnea) 07/25/2011    not using CPAP  . Arthritis     knees  . Anemia   . Complication of anesthesia     pt states after her hysterectomy the doctor said she was "wild" when she woke up    Past Surgical History    Procedure Laterality Date  . Abdominal hysterectomy    . Achilles tendon repair    . Toe amputation      partial rt great toe  . Below knee leg amputation Right 11/25/2012    Dr Sharol Given  . Amputation Right 11/25/2012    Procedure: AMPUTATION BELOW KNEE;  Surgeon: Newt Minion, MD;  Location: [place];  Service: Orthopedics;  Laterality: Right;  Right Below Knee Amputation  . Breast surgery Left     Lumpectomy non ca   Family History  Problem Relation Age of Onset  . Diabetes Sister    History  Substance Use Topics  . Smoking status: Never Smoker   . Smokeless tobacco: Never Used  . Alcohol Use: No   OB History   Grav Para Term Preterm Abortions TAB SAB Ect Mult Living   1 1 1       1      Review of Systems  Constitutional: Negative for fever and chills.  HENT: Negative.   Respiratory: Negative for cough and shortness of breath.   Cardiovascular: Negative for chest pain.  Gastrointestinal: Negative for nausea, vomiting, abdominal pain and anorexia.  Genitourinary: Negative for dysuria, urgency and frequency.  Musculoskeletal: Positive for back pain.  Skin: Negative for rash.  Neurological: Negative for syncope and headaches.  Psychiatric/Behavioral: Negative for confusion. The patient is not nervous/anxious.       Allergies  Review of patient's allergies indicates no known allergies.  Home Medications   Prior to Admission medications   Medication Sig Start Date End Date Taking? Authorizing Provider  aspirin EC 81 MG tablet Take 1 tablet (81 mg total) by mouth daily. 03/17/13   Kathie Dike, MD  cholecalciferol (VITAMIN D) 1000 UNITS tablet Take 1,000 Units by mouth 2 (two) times daily.    Historical Provider, MD  cilostazol (PLETAL) 100 MG tablet Take 100 mg by mouth 3 (three) times daily. 02/15/13   Historical Provider, MD  cyclobenzaprine (FLEXERIL) 5 MG tablet Take 5 mg by mouth 2 (two) times daily as needed. For pain/muscle relanxt 06/20/13   Historical Provider, MD   DULoxetine (CYMBALTA) 60 MG capsule Take 60 mg by mouth at bedtime as needed (for mood/nerves).  08/22/13   Historical Provider, MD  Ferrous Fumarate-Vitamin C 65-125 MG TABS Take 125 mg by mouth 5 (five) times daily.    Historical Provider, MD  gabapentin (NEURONTIN) 300 MG capsule Take 600 mg by mouth 3 (three) times daily. 03/28/13   Historical Provider, MD  ibuprofen (ADVIL,MOTRIN) 200 MG tablet Take 200 mg by mouth every 6 (six) hours as needed.    Historical Provider, MD  insulin lispro protamine-lispro (HUMALOG 50/50) (50-50) 100 UNIT/ML SUSP injection Inject 55-90 Units into the skin 3 (three) times daily with meals. 80-90 units in the morning, 80-90 units in the afternoon, and 55 units at bedtime. Based on sliding scale    Historical Provider, MD  Magnesium 100 MG TABS Take 100 mg by mouth daily.    Historical Provider, MD  metoprolol (LOPRESSOR) 50 MG tablet Take 50 mg by mouth 2 (two) times daily.    Historical Provider, MD  Multiple Vitamin (MULTIVITAMIN WITH MINERALS) TABS Take 1 tablet by mouth once a week.     Historical Provider, MD  oxyCODONE-acetaminophen (ROXICET) 5-325 MG per tablet Take 1-2 tablets by mouth every 6 (six) hours as needed for severe pain. 09/22/13   Hope Bunnie Pion, NP  pravastatin (PRAVACHOL) 20 MG tablet Take 20 mg by mouth at bedtime. 03/01/13   Historical Provider, MD  rOPINIRole (REQUIP) 2 MG tablet Take 2 mg by mouth every evening.     Historical Provider, MD  spironolactone (ALDACTONE) 25 MG tablet Take 25 mg by mouth daily. 02/15/13   Historical Provider, MD  traMADol (ULTRAM) 50 MG tablet Take 50 mg by mouth 4 (four) times daily as needed for pain.     Historical Provider, MD   BP 163/82  Pulse 74  Temp(Src) 97.7 F (36.5 C) (Oral)  Resp 20  Ht 5\' 10"  (1.778 m)  Wt 269 lb (122.018 kg)  BMI 38.60 kg/m2  SpO2 98%  LMP 07/25/2011 Physical Exam  Nursing note and vitals reviewed. Constitutional: She is oriented to person, place, and time. No distress.   obese  HENT:  Head: Normocephalic and atraumatic.  Nose: Nose normal.  Eyes: Conjunctivae and EOM are normal.  Neck: Normal range of motion. Neck supple.  Cardiovascular: Normal rate and regular rhythm.   Pulmonary/Chest: Effort normal. She has no wheezes. She has no rales.  Abdominal: Soft. Bowel sounds are normal. There is no tenderness.  Musculoskeletal: Normal range of motion.       Lumbar back: She exhibits tenderness, pain and spasm. She exhibits no swelling.       Back:  Neurological: She is alert and  oriented to person, place, and time. She has normal strength. No cranial nerve deficit or sensory deficit. Gait normal.  Reflex Scores:      Bicep reflexes are 2+ on the left side.      Brachioradialis reflexes are 2+ on the left side.      Patellar reflexes are 2+ on the left side.      Achilles reflexes are 2+ on the left side. BKA right. Left pedal pulse strong, adequate circulation. Pain with palpation over right sciatic nerve.   Skin: Skin is warm and dry.  Psychiatric: She has a normal mood and affect. Her behavior is normal.    Dg Pelvis 1-2 Views  10/04/2013   CLINICAL DATA:  Right groin pain.  EXAM: PELVIS - 1-2 VIEW  COMPARISON:  CT abdomen and pelvis 02/04/2012.  FINDINGS: Both hips are located. No fracture is identified. There is only mild degenerative change about the hips. Sacroiliac joints appear normal. No focal bony lesion is identified.  IMPRESSION: Mild bilateral hip degenerative disease. The examination is otherwise negative.   Electronically Signed   By: Inge Rise M.D.   On: 10/04/2013 10:57    ED Course  Procedures  MDM  68 y.o. female with chronic low back pain. Will treat pain and patient will return for her MRI as scheduled by Dr. Merlene Laughter. Discussed chronic pain and need for management by Dr. Merlene Laughter. Patient voices understanding. Stable for discharge without further screening at this time    Medication List    STOP taking these medications        oxyCODONE-acetaminophen 5-325 MG per tablet  Commonly known as:  ROXICET     traMADol 50 MG tablet  Commonly known as:  ULTRAM      ASK your doctor about these medications       aspirin EC 81 MG tablet  Take 1 tablet (81 mg total) by mouth daily.     cholecalciferol 1000 UNITS tablet  Commonly known as:  VITAMIN D  Take 1,000 Units by mouth daily.     cilostazol 100 MG tablet  Commonly known as:  PLETAL  Take 100 mg by mouth 2 (two) times daily.     cyclobenzaprine 5 MG tablet  Commonly known as:  FLEXERIL  Take 5 mg by mouth 2 (two) times daily as needed. For pain/muscle relanxt  Ask about: Which instructions should I use?     cyclobenzaprine 5 MG tablet  Commonly known as:  FLEXERIL  Take 1 tablet (5 mg total) by mouth 2 (two) times daily with a meal.  Ask about: Which instructions should I use?     DULoxetine 60 MG capsule  Commonly known as:  CYMBALTA  Take 60 mg by mouth at bedtime as needed (for mood/nerves).     ferrous sulfate 325 (65 FE) MG tablet  Take 325 mg by mouth daily with breakfast.     gabapentin 300 MG capsule  Commonly known as:  NEURONTIN  Take 600 mg by mouth 3 (three) times daily.     insulin lispro protamine-lispro (50-50) 100 UNIT/ML Susp injection  Commonly known as:  HUMALOG 50/50 MIX  Inject 55-90 Units into the skin 3 (three) times daily with meals. 80-90 units in the morning, 80-90 units in the afternoon, and 55 units at bedtime. Based on sliding scale     Magnesium 100 MG Tabs  Take 100 mg by mouth daily.     metoprolol 50 MG tablet  Commonly known as:  LOPRESSOR  Take 50 mg by mouth 2 (two) times daily.     multivitamin with minerals Tabs tablet  Take 1 tablet by mouth once a week.     pravastatin 40 MG tablet  Commonly known as:  PRAVACHOL  Take 40 mg by mouth daily.     rOPINIRole 2 MG tablet  Commonly known as:  REQUIP  Take 2 mg by mouth every evening.     spironolactone 25 MG tablet  Commonly known as:   ALDACTONE  Take 25 mg by mouth daily.          [facility] Bunnie Pion, [place] 10/04/13 1728

## 2013-10-04 NOTE — Discharge Instructions (Signed)
Get the Rx for pain filled that Dr. Merlene Laughter gave you. We are giving you a muscle relaxant in addition. Do not take these medications if you are doing any activity that may cause injury because they will make you sleepy. Follow up for your MRI as scheduled.

## 2013-10-04 NOTE — ED Notes (Signed)
Patient c/o pain in right buttock that radiates into right groin and back of right leg to knee. Per patient no known injury, woke with the pain. Patient reports being unable to bear weight on right leg due to pain. Per patient taking tramadol with no relief.

## 2013-10-04 NOTE — ED Notes (Signed)
Pt states she is unable to obtain urine spec at present. States she want to wait until her pain shot kicks in

## 2013-10-08 ENCOUNTER — Ambulatory Visit (HOSPITAL_COMMUNITY): Payer: Medicare HMO | Admitting: *Deleted

## 2013-10-08 NOTE — ED Provider Notes (Signed)
Medical screening examination/treatment/procedure(s) were performed by non-physician practitioner and as supervising physician I was immediately available for consultation/collaboration.   EKG Interpretation None       Nat Christen, MD 10/08/13 1752

## 2013-10-09 ENCOUNTER — Ambulatory Visit (HOSPITAL_COMMUNITY)
Admission: RE | Admit: 2013-10-09 | Discharge: 2013-10-09 | Disposition: A | Discharge status: Home / Self Care | Payer: Medicare HMO | Source: Ambulatory Visit | Attending: Ophthalmology | Admitting: Ophthalmology

## 2013-10-09 ENCOUNTER — Encounter (HOSPITAL_COMMUNITY): Payer: Self-pay

## 2013-10-09 ENCOUNTER — Ambulatory Visit (HOSPITAL_COMMUNITY): Payer: Medicare HMO | Admitting: Anesthesiology

## 2013-10-09 ENCOUNTER — Encounter (HOSPITAL_COMMUNITY)
Admission: RE | Disposition: A | Discharge status: Home / Self Care | Payer: Self-pay | Source: Ambulatory Visit | Attending: Ophthalmology

## 2013-10-09 ENCOUNTER — Other Ambulatory Visit (HOSPITAL_COMMUNITY): Payer: Medicare HMO

## 2013-10-09 ENCOUNTER — Encounter (HOSPITAL_COMMUNITY): Payer: Medicare HMO | Admitting: Anesthesiology

## 2013-10-09 DIAGNOSIS — H2510 Age-related nuclear cataract, unspecified eye: Secondary | ICD-10-CM | POA: Insufficient documentation

## 2013-10-09 DIAGNOSIS — G4730 Sleep apnea, unspecified: Secondary | ICD-10-CM | POA: Insufficient documentation

## 2013-10-09 DIAGNOSIS — Z79899 Other long term (current) drug therapy: Secondary | ICD-10-CM | POA: Insufficient documentation

## 2013-10-09 DIAGNOSIS — N289 Disorder of kidney and ureter, unspecified: Secondary | ICD-10-CM | POA: Insufficient documentation

## 2013-10-09 DIAGNOSIS — D649 Anemia, unspecified: Secondary | ICD-10-CM | POA: Insufficient documentation

## 2013-10-09 DIAGNOSIS — F411 Generalized anxiety disorder: Secondary | ICD-10-CM | POA: Insufficient documentation

## 2013-10-09 DIAGNOSIS — E109 Type 1 diabetes mellitus without complications: Secondary | ICD-10-CM | POA: Insufficient documentation

## 2013-10-09 DIAGNOSIS — G709 Myoneural disorder, unspecified: Secondary | ICD-10-CM | POA: Insufficient documentation

## 2013-10-09 DIAGNOSIS — I739 Peripheral vascular disease, unspecified: Secondary | ICD-10-CM | POA: Insufficient documentation

## 2013-10-09 DIAGNOSIS — I509 Heart failure, unspecified: Secondary | ICD-10-CM | POA: Insufficient documentation

## 2013-10-09 DIAGNOSIS — I10 Essential (primary) hypertension: Secondary | ICD-10-CM | POA: Insufficient documentation

## 2013-10-09 DIAGNOSIS — I499 Cardiac arrhythmia, unspecified: Secondary | ICD-10-CM | POA: Insufficient documentation

## 2013-10-09 HISTORY — PX: CATARACT EXTRACTION W/PHACO: SHX586

## 2013-10-09 LAB — GLUCOSE, CAPILLARY: GLUCOSE-CAPILLARY: 190 mg/dL — AB (ref 70–99)

## 2013-10-09 SURGERY — PHACOEMULSIFICATION, CATARACT, WITH IOL INSERTION
Anesthesia: Monitor Anesthesia Care | Site: Eye | Laterality: Right

## 2013-10-09 MED ORDER — MIDAZOLAM HCL 5 MG/5ML IJ SOLN
INTRAMUSCULAR | Status: DC | PRN
  Administered 2013-10-09 (×2): 1 mg via INTRAVENOUS

## 2013-10-09 MED ORDER — FENTANYL CITRATE 0.05 MG/ML IJ SOLN
25.0000 ug | INTRAMUSCULAR | Status: AC
  Administered 2013-10-09 (×2): 25 ug via INTRAVENOUS

## 2013-10-09 MED ORDER — MIDAZOLAM HCL 2 MG/2ML IJ SOLN
1.0000 mg | INTRAMUSCULAR | Status: DC | PRN
  Administered 2013-10-09: 2 mg via INTRAVENOUS

## 2013-10-09 MED ORDER — CYCLOPENTOLATE-PHENYLEPHRINE 0.2-1 % OP SOLN
1.0000 [drp] | OPHTHALMIC | Status: AC
  Administered 2013-10-09 (×3): 1 [drp] via OPHTHALMIC

## 2013-10-09 MED ORDER — PHENYLEPHRINE HCL 2.5 % OP SOLN
OPHTHALMIC | Status: AC
Start: 2013-10-09 — End: 2013-10-09
  Filled 2013-10-09: qty 15

## 2013-10-09 MED ORDER — PHENYLEPHRINE HCL 2.5 % OP SOLN
1.0000 [drp] | OPHTHALMIC | Status: AC
  Administered 2013-10-09 (×3): 1 [drp] via OPHTHALMIC

## 2013-10-09 MED ORDER — BSS IO SOLN
INTRAOCULAR | Status: DC | PRN
  Administered 2013-10-09: 15 mL

## 2013-10-09 MED ORDER — LACTATED RINGERS IV SOLN
INTRAVENOUS | Status: DC
  Administered 2013-10-09: 08:00:00 via INTRAVENOUS

## 2013-10-09 MED ORDER — PROVISC 10 MG/ML IO SOLN
INTRAOCULAR | Status: DC | PRN
  Administered 2013-10-09: 0.85 mL via INTRAOCULAR

## 2013-10-09 MED ORDER — KETOROLAC TROMETHAMINE 0.5 % OP SOLN
1.0000 [drp] | OPHTHALMIC | Status: AC
  Administered 2013-10-09 (×3): 1 [drp] via OPHTHALMIC

## 2013-10-09 MED ORDER — MIDAZOLAM HCL 2 MG/2ML IJ SOLN
INTRAMUSCULAR | Status: AC
  Filled 2013-10-09: qty 2

## 2013-10-09 MED ORDER — KETOROLAC TROMETHAMINE 0.5 % OP SOLN
OPHTHALMIC | Status: AC
  Filled 2013-10-09: qty 5

## 2013-10-09 MED ORDER — LACTATED RINGERS IV SOLN
INTRAVENOUS | Status: DC | PRN
  Administered 2013-10-09: 08:00:00 via INTRAVENOUS

## 2013-10-09 MED ORDER — TETRACAINE HCL 0.5 % OP SOLN
1.0000 [drp] | OPHTHALMIC | Status: AC
  Administered 2013-10-09 (×3): 1 [drp] via OPHTHALMIC

## 2013-10-09 MED ORDER — EPINEPHRINE HCL 1 MG/ML IJ SOLN
INTRAOCULAR | Status: DC | PRN
  Administered 2013-10-09: 08:00:00

## 2013-10-09 MED ORDER — FENTANYL CITRATE 0.05 MG/ML IJ SOLN
INTRAMUSCULAR | Status: AC
  Filled 2013-10-09: qty 2

## 2013-10-09 MED ORDER — TETRACAINE HCL 0.5 % OP SOLN
OPHTHALMIC | Status: AC
  Filled 2013-10-09: qty 2

## 2013-10-09 MED ORDER — CYCLOPENTOLATE-PHENYLEPHRINE OP SOLN OPTIME - NO CHARGE
OPHTHALMIC | Status: AC
  Filled 2013-10-09: qty 2

## 2013-10-09 MED ORDER — TETRACAINE 0.5 % OP SOLN OPTIME - NO CHARGE
OPHTHALMIC | Status: DC | PRN
  Administered 2013-10-09: 1 [drp] via OPHTHALMIC

## 2013-10-09 SURGICAL SUPPLY — 26 items
CAPSULAR TENSION RING-AMO (OPHTHALMIC RELATED) IMPLANT
CLOTH BEACON ORANGE TIMEOUT ST (SAFETY) ×3 IMPLANT
EYE SHIELD UNIVERSAL CLEAR (GAUZE/BANDAGES/DRESSINGS) ×3 IMPLANT
GLOVE BIO SURGEON STRL SZ 6.5 (GLOVE) ×2 IMPLANT
GLOVE BIO SURGEONS STRL SZ 6.5 (GLOVE) ×1
GLOVE BIOGEL PI IND STRL 7.0 (GLOVE) ×1 IMPLANT
GLOVE BIOGEL PI INDICATOR 7.0 (GLOVE) ×2
GLOVE ECLIPSE 6.5 STRL STRAW (GLOVE) IMPLANT
GLOVE ECLIPSE 7.0 STRL STRAW (GLOVE) IMPLANT
GLOVE EXAM NITRILE LRG STRL (GLOVE) IMPLANT
GLOVE EXAM NITRILE MD LF STRL (GLOVE) IMPLANT
GLOVE SKINSENSE NS SZ6.5 (GLOVE)
GLOVE SKINSENSE STRL SZ6.5 (GLOVE) IMPLANT
HEALON 5 0.6 ML (INTRAOCULAR LENS) IMPLANT
KIT VITRECTOMY (OPHTHALMIC RELATED) IMPLANT
LENS IOL ACRYSOF IQ TORIC 15.0 ×3 IMPLANT
PAD ARMBOARD 7.5X6 YLW CONV (MISCELLANEOUS) ×3 IMPLANT
PROC W NO LENS (INTRAOCULAR LENS)
PROC W SPEC LENS (INTRAOCULAR LENS) ×3
PROCESS W NO LENS (INTRAOCULAR LENS) IMPLANT
PROCESS W SPEC LENS (INTRAOCULAR LENS) ×1 IMPLANT
RING MALYGIN (MISCELLANEOUS) IMPLANT
TAPE SURG TRANSPORE 1 IN (GAUZE/BANDAGES/DRESSINGS) ×1 IMPLANT
TAPE SURGICAL TRANSPORE 1 IN (GAUZE/BANDAGES/DRESSINGS) ×2
VISCOELASTIC ADDITIONAL (OPHTHALMIC RELATED) IMPLANT
WATER STERILE IRR 250ML POUR (IV SOLUTION) ×3 IMPLANT

## 2013-10-09 NOTE — Transfer of Care (Signed)
Immediate Anesthesia Transfer of Care Note  Patient: Mary [name]  Procedure(s) Performed: Procedure(s) with comments: CATARACT EXTRACTION PHACO AND INTRAOCULAR LENS PLACEMENT (IOC) (Right) - CDE:9.05  Patient Location: Short Stay  Anesthesia Type:MAC  Level of Consciousness: awake, alert , oriented and patient cooperative  Airway & Oxygen Therapy: Patient Spontanous Breathing  Post-op Assessment: Report given to PACU RN and Post -op Vital signs reviewed and stable  Post vital signs: Reviewed and stable  Complications: No apparent anesthesia complications

## 2013-10-09 NOTE — Anesthesia Postprocedure Evaluation (Signed)
  Anesthesia Post-op Note  Patient: Mary [name]  Procedure(s) Performed: Procedure(s) with comments: CATARACT EXTRACTION PHACO AND INTRAOCULAR LENS PLACEMENT (IOC) (Right) - CDE:9.05  Patient Location: Short Stay  Anesthesia Type:MAC  Level of Consciousness: awake, alert , oriented and patient cooperative  Airway and Oxygen Therapy: Patient Spontanous Breathing  Post-op Pain: none  Post-op Assessment: Post-op Vital signs reviewed, Patient's Cardiovascular Status Stable, Respiratory Function Stable, Patent Airway, No signs of Nausea or vomiting and Pain level controlled  Post-op Vital Signs: Reviewed and stable  Last Vitals:  Filed Vitals:   10/09/13 0800  BP: 166/81  Pulse:   Temp:   Resp: 18    Complications: No apparent anesthesia complications

## 2013-10-09 NOTE — Discharge Instructions (Signed)
Mary [name]  10/09/2013           [place] Instructions [place] [id number] [address]      1. Avoid closing eyes tightly. One often closes the eye tightly when laughing, talking, sneezing, coughing or if they feel irritated. At these times, you should be careful not to close your eyes tightly.  2. Instill eye drops as instructed. To instill drops in your eye, open it, look up and have someone gently pull the lower lid down and instill a couple of drops inside the lower lid.  3. Do not touch upper lid.  4. Take Advil or Tylenol for pain.  5. You may use either eye for near work, such as reading or sewing and you may watch television.  6. You may have your hair done at the beauty parlor at any time.  7. Wear dark glasses with or without your own glasses if you are in bright light.  8. Call our office at [phone] or [phone] if you have sharp pain in your eye or unusual symptoms.  9. Do not be concerned because vision in the operative eye is not good. It will not be good, no matter how successful the operation, until you get a special lens for it. Your old glasses will not be suited to the new eye that was operated on and you will not be ready for a new lens for about a month.  10. Follow up at the [facility] office.    I have received a copy of the above instructions and will follow them.

## 2013-10-09 NOTE — Anesthesia Preprocedure Evaluation (Signed)
Anesthesia Evaluation  Patient identified by MRN, date of birth, ID band Patient awake    Reviewed: Allergy & Precautions, H&P , NPO status , Patient's Chart, lab work & pertinent test results, reviewed documented beta blocker date and time   Airway Mallampati: II TM Distance: >3 FB Neck ROM: Full    Dental no notable dental hx. (+) Teeth Intact, Dental Advisory Given   Pulmonary sleep apnea ,  breath sounds clear to auscultation  Pulmonary exam normal       Cardiovascular hypertension, On Medications, On Home Beta Blockers and Pt. on medications + Peripheral Vascular Disease and +CHF + dysrhythmias Rhythm:Regular Rate:Normal     Neuro/Psych PSYCHIATRIC DISORDERS Anxiety  Neuromuscular disease negative psych ROS   GI/Hepatic negative GI ROS, Neg liver ROS,   Endo/Other  diabetes, Type 1, Insulin DependentMorbid obesity  Renal/GU Renal diseasenegative Renal ROS  negative genitourinary   Musculoskeletal   Abdominal   Peds  Hematology negative hematology ROS (+) Blood dyscrasia, anemia ,   Anesthesia Other Findings   Reproductive/Obstetrics negative OB ROS                           Anesthesia Physical Anesthesia Plan  ASA: III  Anesthesia Plan: MAC   Post-op Pain Management:    Induction: Intravenous  Airway Management Planned: Nasal Cannula  Additional Equipment:   Intra-op Plan:   Post-operative Plan:   Informed Consent: I have reviewed the patients History and Physical, chart, labs and discussed the procedure including the risks, benefits and alternatives for the proposed anesthesia with the patient or authorized representative who has indicated his/her understanding and acceptance.     Plan Discussed with:   Anesthesia Plan Comments:         Anesthesia Quick Evaluation

## 2013-10-09 NOTE — H&P (Signed)
The patient was re examined and there is no change in the patients condition since the original H and P. 

## 2013-10-09 NOTE — Anesthesia Procedure Notes (Signed)
Procedure Name: MAC Date/Time: 10/09/2013 8:15 AM Performed by: Andree Elk, AMY A Pre-anesthesia Checklist: Patient identified, Timeout performed, Emergency Drugs available, Suction available and Patient being monitored Oxygen Delivery Method: Nasal cannula

## 2013-10-09 NOTE — Op Note (Signed)
Patient brought to the operating room and prepped and draped in the usual manner.  Lid speculum inserted in right eye.  Stab incision made at the twelve o'clock position.  Provisc instilled in the anterior chamber.   A 2.4 mm. Stab incision was made temporally.  An anterior capsulotomy was done with a bent 25 gauge needle.  The nucleus was hydrodissected.  The Phaco tip was inserted in the anterior chamber and the nucleus was emulsified.  CDE was 9.05.  The cortical material was then removed with the I and A tip.  Posterior capsule was the polished.  The anterior chamber was deepened with Provisc.  A 16.5 Alcon [id number] Toric IOL was then inserted in the capsular bag. The Toric IOL was placed at the 53 degree position. Provisc was then removed with the I and A tip.  The wound was then hydrated.  Patient sent to the Recovery Room in good condition with follow up in my office.  Preoperative Diagnosis:  Nuclear Cataract OD Postoperative Diagnosis:  Same Procedure name: Kelman Phacoemulsification OD with IOL

## 2013-10-11 ENCOUNTER — Ambulatory Visit (HOSPITAL_COMMUNITY): Payer: Medicare HMO | Admitting: *Deleted

## 2013-10-11 NOTE — Progress Notes (Signed)
This encounter was created in error - please disregard.

## 2013-10-15 ENCOUNTER — Ambulatory Visit (HOSPITAL_COMMUNITY): Payer: Medicare HMO

## 2013-10-15 ENCOUNTER — Ambulatory Visit (HOSPITAL_COMMUNITY): Payer: Medicare HMO | Admitting: *Deleted

## 2013-10-16 ENCOUNTER — Encounter (HOSPITAL_COMMUNITY): Payer: Medicare HMO | Attending: Ophthalmology

## 2013-10-16 MED ORDER — FENTANYL CITRATE 0.05 MG/ML IJ SOLN: 25.0000 ug | INTRAMUSCULAR | Status: DC | PRN

## 2013-10-16 MED ORDER — ONDANSETRON HCL 4 MG/2ML IJ SOLN: 4.0000 mg | Freq: Once | INTRAMUSCULAR | Status: AC | PRN

## 2013-10-18 ENCOUNTER — Ambulatory Visit (HOSPITAL_COMMUNITY): Payer: Medicare HMO | Admitting: Physical Therapy

## 2013-10-23 ENCOUNTER — Ambulatory Visit (HOSPITAL_COMMUNITY): Payer: Medicare HMO | Admitting: Anesthesiology

## 2013-10-23 ENCOUNTER — Ambulatory Visit (HOSPITAL_COMMUNITY)
Admission: RE | Admit: 2013-10-23 | Discharge: 2013-10-23 | Disposition: A | Discharge status: Home / Self Care | Payer: Medicare HMO | Source: Ambulatory Visit | Attending: Ophthalmology | Admitting: Ophthalmology

## 2013-10-23 ENCOUNTER — Encounter (HOSPITAL_COMMUNITY): Payer: Self-pay | Admitting: *Deleted

## 2013-10-23 ENCOUNTER — Encounter (HOSPITAL_COMMUNITY)
Admission: RE | Disposition: A | Discharge status: Home / Self Care | Payer: Self-pay | Source: Ambulatory Visit | Attending: Ophthalmology

## 2013-10-23 DIAGNOSIS — H2510 Age-related nuclear cataract, unspecified eye: Secondary | ICD-10-CM | POA: Insufficient documentation

## 2013-10-23 DIAGNOSIS — G2581 Restless legs syndrome: Secondary | ICD-10-CM | POA: Insufficient documentation

## 2013-10-23 DIAGNOSIS — Z5309 Procedure and treatment not carried out because of other contraindication: Secondary | ICD-10-CM | POA: Insufficient documentation

## 2013-10-23 HISTORY — PX: CATARACT EXTRACTION W/PHACO: SHX586

## 2013-10-23 LAB — GLUCOSE, CAPILLARY: GLUCOSE-CAPILLARY: 157 mg/dL — AB (ref 70–99)

## 2013-10-23 SURGERY — PHACOEMULSIFICATION, CATARACT, WITH IOL INSERTION
Anesthesia: Monitor Anesthesia Care | Site: Eye | Laterality: Left

## 2013-10-23 MED ORDER — CYCLOPENTOLATE-PHENYLEPHRINE 0.2-1 % OP SOLN
1.0000 [drp] | OPHTHALMIC | Status: AC | PRN
  Administered 2013-10-23 (×3): 1 [drp] via OPHTHALMIC

## 2013-10-23 MED ORDER — FENTANYL CITRATE 0.05 MG/ML IJ SOLN
INTRAMUSCULAR | Status: AC
  Filled 2013-10-23: qty 2

## 2013-10-23 MED ORDER — PHENYLEPHRINE HCL 2.5 % OP SOLN
OPHTHALMIC | Status: AC
  Filled 2013-10-23: qty 15

## 2013-10-23 MED ORDER — FENTANYL CITRATE 0.05 MG/ML IJ SOLN
INTRAMUSCULAR | Status: DC | PRN
  Administered 2013-10-23 (×2): 25 ug via INTRAVENOUS

## 2013-10-23 MED ORDER — KETOROLAC TROMETHAMINE 0.5 % OP SOLN
1.0000 [drp] | OPHTHALMIC | Status: AC | PRN
  Administered 2013-10-23 (×3): 1 [drp] via OPHTHALMIC

## 2013-10-23 MED ORDER — LACTATED RINGERS IV SOLN
INTRAVENOUS | Status: DC
  Administered 2013-10-23: 07:00:00 via INTRAVENOUS

## 2013-10-23 MED ORDER — TETRACAINE HCL 0.5 % OP SOLN
OPHTHALMIC | Status: AC
  Filled 2013-10-23: qty 2

## 2013-10-23 MED ORDER — MIDAZOLAM HCL 2 MG/2ML IJ SOLN
INTRAMUSCULAR | Status: AC
  Filled 2013-10-23: qty 2

## 2013-10-23 MED ORDER — PROVISC 10 MG/ML IO SOLN
INTRAOCULAR | Status: DC | PRN
  Administered 2013-10-23: 0.85 mL via INTRAOCULAR

## 2013-10-23 MED ORDER — TETRACAINE 0.5 % OP SOLN OPTIME - NO CHARGE
OPHTHALMIC | Status: DC | PRN
  Administered 2013-10-23: 1 [drp] via OPHTHALMIC

## 2013-10-23 MED ORDER — BSS IO SOLN
INTRAOCULAR | Status: DC | PRN
  Administered 2013-10-23: 15 mL via INTRAOCULAR

## 2013-10-23 MED ORDER — PHENYLEPHRINE HCL 2.5 % OP SOLN
1.0000 [drp] | OPHTHALMIC | Status: AC | PRN
  Administered 2013-10-23 (×3): 1 [drp] via OPHTHALMIC

## 2013-10-23 MED ORDER — FENTANYL CITRATE 0.05 MG/ML IJ SOLN
25.0000 ug | INTRAMUSCULAR | Status: AC
  Administered 2013-10-23 (×2): 25 ug via INTRAVENOUS

## 2013-10-23 MED ORDER — TETRACAINE HCL 0.5 % OP SOLN
1.0000 [drp] | OPHTHALMIC | Status: AC | PRN
  Administered 2013-10-23 (×3): 1 [drp] via OPHTHALMIC

## 2013-10-23 MED ORDER — MIDAZOLAM HCL 2 MG/2ML IJ SOLN
1.0000 mg | INTRAMUSCULAR | Status: DC | PRN
  Administered 2013-10-23 (×2): 1 mg via INTRAVENOUS

## 2013-10-23 MED ORDER — EPINEPHRINE HCL 1 MG/ML IJ SOLN
INTRAMUSCULAR | Status: AC
  Filled 2013-10-23: qty 1

## 2013-10-23 MED ORDER — KETOROLAC TROMETHAMINE 0.5 % OP SOLN
OPHTHALMIC | Status: AC
  Filled 2013-10-23: qty 5

## 2013-10-23 MED ORDER — BSS IO SOLN
INTRAOCULAR | Status: DC | PRN
  Administered 2013-10-23: 08:00:00

## 2013-10-23 MED ORDER — MIDAZOLAM HCL 5 MG/5ML IJ SOLN
INTRAMUSCULAR | Status: DC | PRN
  Administered 2013-10-23 (×2): 1 mg via INTRAVENOUS

## 2013-10-23 MED ORDER — CYCLOPENTOLATE-PHENYLEPHRINE OP SOLN OPTIME - NO CHARGE
OPHTHALMIC | Status: AC
  Filled 2013-10-23: qty 2

## 2013-10-23 SURGICAL SUPPLY — 24 items
CAPSULAR TENSION RING-AMO (OPHTHALMIC RELATED) IMPLANT
CLOTH BEACON ORANGE TIMEOUT ST (SAFETY) ×3 IMPLANT
EYE SHIELD UNIVERSAL CLEAR (GAUZE/BANDAGES/DRESSINGS) ×3 IMPLANT
GLOVE BIO SURGEON STRL SZ 6.5 (GLOVE) ×2 IMPLANT
GLOVE BIO SURGEONS STRL SZ 6.5 (GLOVE) ×1
GLOVE ECLIPSE 6.5 STRL STRAW (GLOVE) IMPLANT
GLOVE ECLIPSE 7.0 STRL STRAW (GLOVE) IMPLANT
GLOVE EXAM NITRILE LRG STRL (GLOVE) IMPLANT
GLOVE EXAM NITRILE MD LF STRL (GLOVE) IMPLANT
GLOVE SKINSENSE NS SZ6.5 (GLOVE)
GLOVE SKINSENSE STRL SZ6.5 (GLOVE) IMPLANT
GLOVE SS N UNI LF 7.0 STRL (GLOVE) ×3 IMPLANT
HEALON 5 0.6 ML (INTRAOCULAR LENS) IMPLANT
KIT VITRECTOMY (OPHTHALMIC RELATED) IMPLANT
PAD ARMBOARD 7.5X6 YLW CONV (MISCELLANEOUS) ×3 IMPLANT
PROC W NO LENS (INTRAOCULAR LENS) ×3
PROC W SPEC LENS (INTRAOCULAR LENS)
PROCESS W NO LENS (INTRAOCULAR LENS) ×1 IMPLANT
PROCESS W SPEC LENS (INTRAOCULAR LENS) IMPLANT
RING MALYGIN (MISCELLANEOUS) IMPLANT
TAPE SURG TRANSPORE 1 IN (GAUZE/BANDAGES/DRESSINGS) ×1 IMPLANT
TAPE SURGICAL TRANSPORE 1 IN (GAUZE/BANDAGES/DRESSINGS) ×2
VISCOELASTIC ADDITIONAL (OPHTHALMIC RELATED) IMPLANT
WATER STERILE IRR 250ML POUR (IV SOLUTION) ×3 IMPLANT

## 2013-10-23 NOTE — Anesthesia Postprocedure Evaluation (Signed)
  Anesthesia Post-op Note  Patient: Mary [name]  Procedure(s) Performed: Procedure(s) with comments: ATTEMPTED CATARACT EXTRACTION PHACO AND INTRAOCULAR LENS PLACEMENT (Left) - CDE:  4.41  Patient Location: Short Stay  Anesthesia Type:MAC  Level of Consciousness: awake, alert , oriented and patient cooperative  Airway and Oxygen Therapy: Patient Spontanous Breathing  Post-op Pain: none  Post-op Assessment: Post-op Vital signs reviewed, Patient's Cardiovascular Status Stable, Respiratory Function Stable, Patent Airway, No signs of Nausea or vomiting and Pain level controlled  Post-op Vital Signs: Reviewed and stable  Last Vitals:  Filed Vitals:   10/23/13 0745  BP: 150/68  Pulse:   Temp:   Resp: 26    Complications: No apparent anesthesia complications

## 2013-10-23 NOTE — Transfer of Care (Signed)
Immediate Anesthesia Transfer of Care Note  Patient: Mary [name]  Procedure(s) Performed: Procedure(s) with comments: ATTEMPTED CATARACT EXTRACTION PHACO AND INTRAOCULAR LENS PLACEMENT (Left) - CDE:  4.41  Patient Location: Short Stay  Anesthesia Type:MAC  Level of Consciousness: awake and patient cooperative  Airway & Oxygen Therapy: Patient Spontanous Breathing  Post-op Assessment: Report given to PACU RN, Post -op Vital signs reviewed and stable and Patient moving all extremities  Post vital signs: Reviewed and stable  Complications: No apparent anesthesia complications

## 2013-10-23 NOTE — H&P (Signed)
The patient was re examined and there is no change in the patients condition since the original H and P. 

## 2013-10-23 NOTE — Discharge Instructions (Signed)
PATIENT INSTRUCTIONS POST-ANESTHESIA  IMMEDIATELY FOLLOWING SURGERY:  Do not drive or operate machinery for the first twenty four hours after surgery.  Do not make any important decisions for twenty four hours after surgery or while taking narcotic pain medications or sedatives.  If you develop intractable nausea and vomiting or a severe headache please notify your doctor immediately.  FOLLOW-UP:  Please make an appointment with your surgeon as instructed. You do not need to follow up with anesthesia unless specifically instructed to do so.  WOUND CARE INSTRUCTIONS (if applicable):  Keep a dry clean dressing on the anesthesia/puncture wound site if there is drainage.  Once the wound has quit draining you may leave it open to air.  Generally you should leave the bandage intact for twenty four hours unless there is drainage.  If the epidural site drains for more than 36-48 hours please call the anesthesia department.  QUESTIONS?:  Please feel free to call your physician or the hospital operator if you have any questions, and they will be happy to assist you    Mary [name]  10/23/2013           [facility] Instructions [place] [id number]      1. Avoid closing eyes tightly. One often closes the eye tightly when laughing, talking, sneezing, coughing or if they feel irritated. At these times, you should be careful not to close your eyes tightly.  2. Instill eye drops as instructed. To instill drops in your eye, open it, look up and have someone gently pull the lower lid down and instill a couple of drops inside the lower lid.  3. Do not touch upper lid.  4. Take Advil or Tylenol for pain.  5. You may use either eye for near work, such as reading or sewing and you may watch television.  6. You may have your hair done at the beauty parlor at any time.  7. Wear dark glasses with or without your own glasses if you are in bright light.  8.  Call our office at [phone] or [phone] if you have sharp pain in your eye or unusual symptoms.  9. Do not be concerned because vision in the operative eye is not good. It will not be good, no matter how successful the operation, until you get a special lens for it. Your old glasses will not be suited to the new eye that was operated on and you will not be ready for a new lens for about a month.  10. Follow up at the [facility] office.    I have received a copy of the above instructions and will follow them.      Marland Kitchen

## 2013-10-23 NOTE — Anesthesia Preprocedure Evaluation (Signed)
Anesthesia Evaluation  Patient identified by MRN, date of birth, ID band Patient awake    Reviewed: Allergy & Precautions, H&P , NPO status , Patient's Chart, lab work & pertinent test results, reviewed documented beta blocker date and time   Airway Mallampati: II TM Distance: >3 FB Neck ROM: Full    Dental no notable dental hx. (+) Teeth Intact, Dental Advisory Given   Pulmonary sleep apnea ,  breath sounds clear to auscultation  Pulmonary exam normal       Cardiovascular hypertension, On Medications, On Home Beta Blockers and Pt. on medications + Peripheral Vascular Disease and +CHF + dysrhythmias Rhythm:Regular Rate:Normal     Neuro/Psych PSYCHIATRIC DISORDERS Anxiety  Neuromuscular disease negative psych ROS   GI/Hepatic negative GI ROS, Neg liver ROS,   Endo/Other  diabetes, Type 1, Insulin DependentMorbid obesity  Renal/GU Renal diseasenegative Renal ROS  negative genitourinary   Musculoskeletal   Abdominal   Peds  Hematology negative hematology ROS (+) Blood dyscrasia, anemia ,   Anesthesia Other Findings   Reproductive/Obstetrics negative OB ROS                           Anesthesia Physical Anesthesia Plan  ASA: III  Anesthesia Plan: MAC   Post-op Pain Management:    Induction: Intravenous  Airway Management Planned: Nasal Cannula  Additional Equipment:   Intra-op Plan:   Post-operative Plan:   Informed Consent: I have reviewed the patients History and Physical, chart, labs and discussed the procedure including the risks, benefits and alternatives for the proposed anesthesia with the patient or authorized representative who has indicated his/her understanding and acceptance.     Plan Discussed with:   Anesthesia Plan Comments:         Anesthesia Quick Evaluation

## 2013-10-23 NOTE — Op Note (Signed)
Patient brought to the operating room and prepped and draped in the usual manner.  Lid speculum inserted in right eye.  Stab incision made at the twelve o'clock position.  Provisc instilled in the anterior chamber.   A 2.4 mm. Stab incision was made temporally.  An anterior capsulotomy was done with a bent 25 gauge needle.  The nucleus was attempted to be hydrodissected.  The patient has severe restless leg syndrome and could not be still.  After several attempts to remove the nucleus, it was elected to stop the case.  The posterior capsule remained intact.  The patient will be referred to [facility] to attempt to finish the case.  Patient sent to the Recovery Room in good condition with follow up in my office.  Preoperative Diagnosis:  Nuclear Cataract OS  Postoperative Diagnosis:  Same Procedure name: Attempted Kelman Phacoemulsification OS.  Case aborted mid case

## 2013-10-24 ENCOUNTER — Encounter (HOSPITAL_COMMUNITY): Payer: Self-pay | Admitting: Ophthalmology

## 2013-11-14 IMAGING — CR DG FOOT 2V*R*
2 series · 2 of 2 positions shown · non-contrast
Comparison: Calcaneus views 01/12/2012.

CLINICAL DATA: 66-year-old female with diabetic ulcer on the bottom
of the right foot.

RIGHT FOOT - 2 VIEW

[view not recorded (1 of 2)]
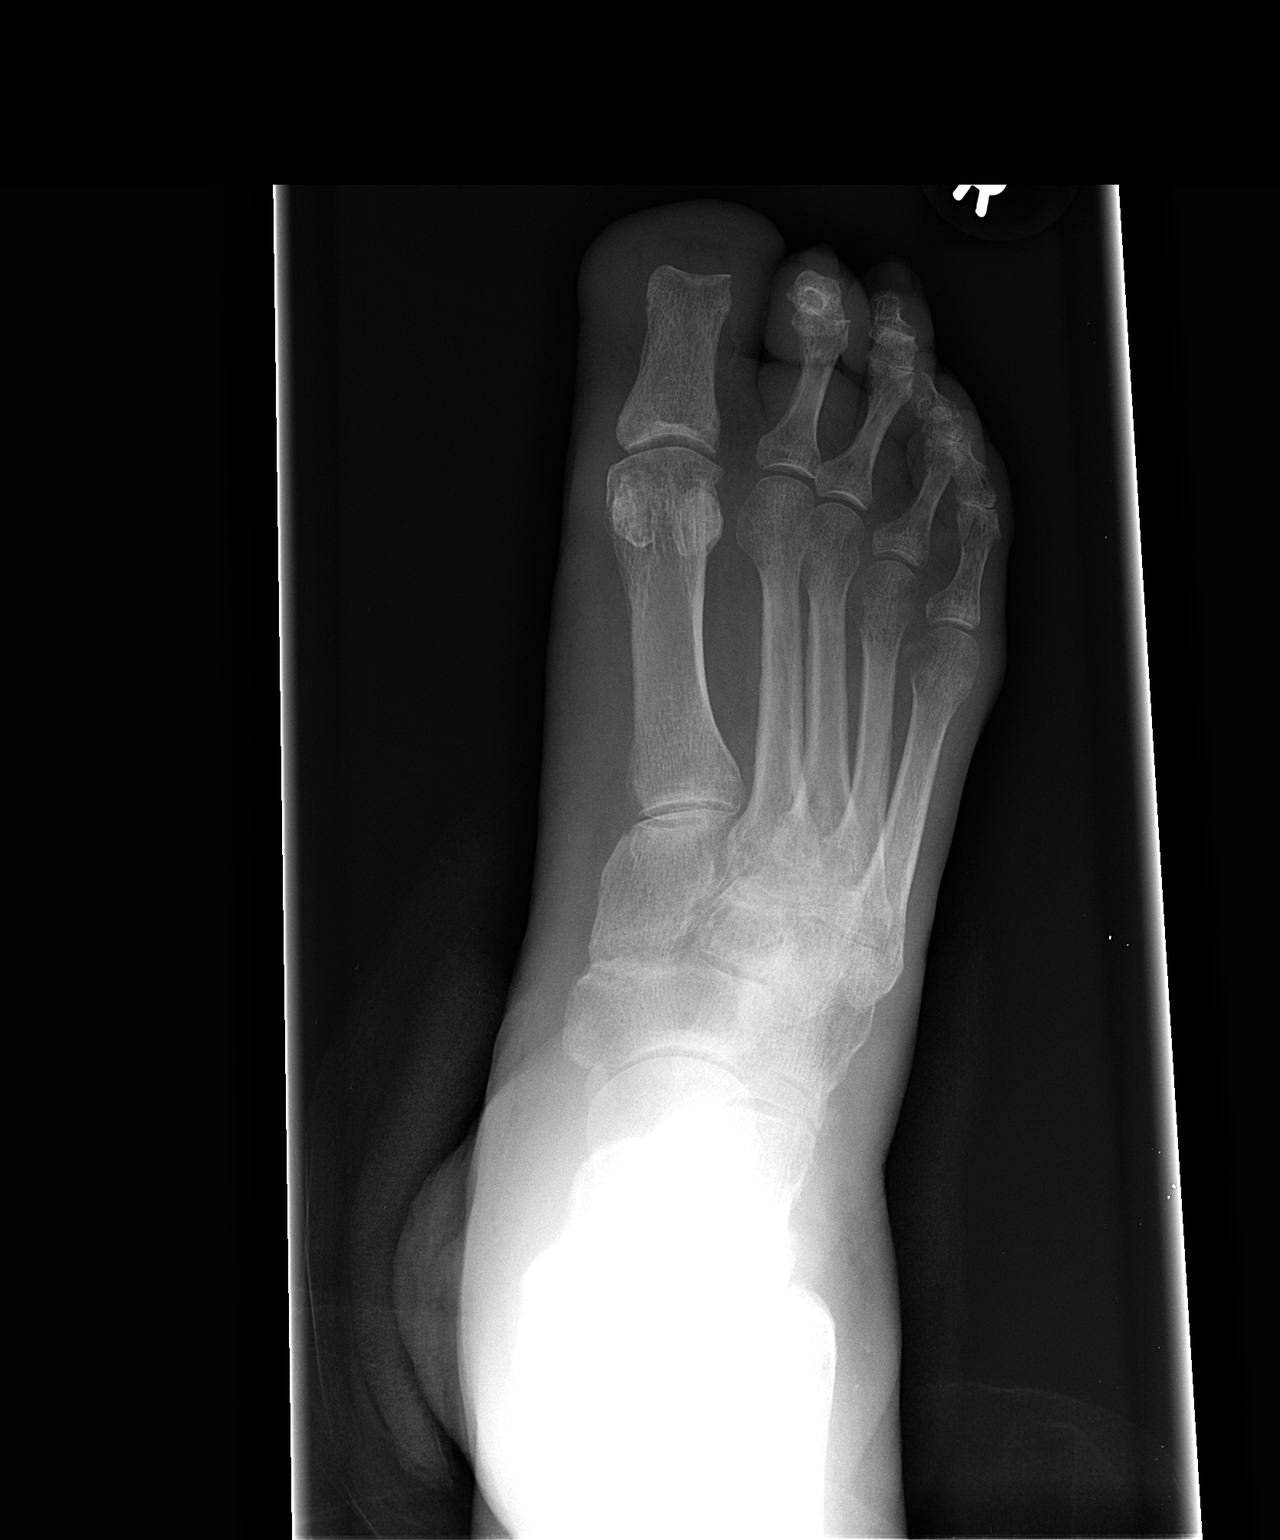

[view not recorded (2 of 2)]
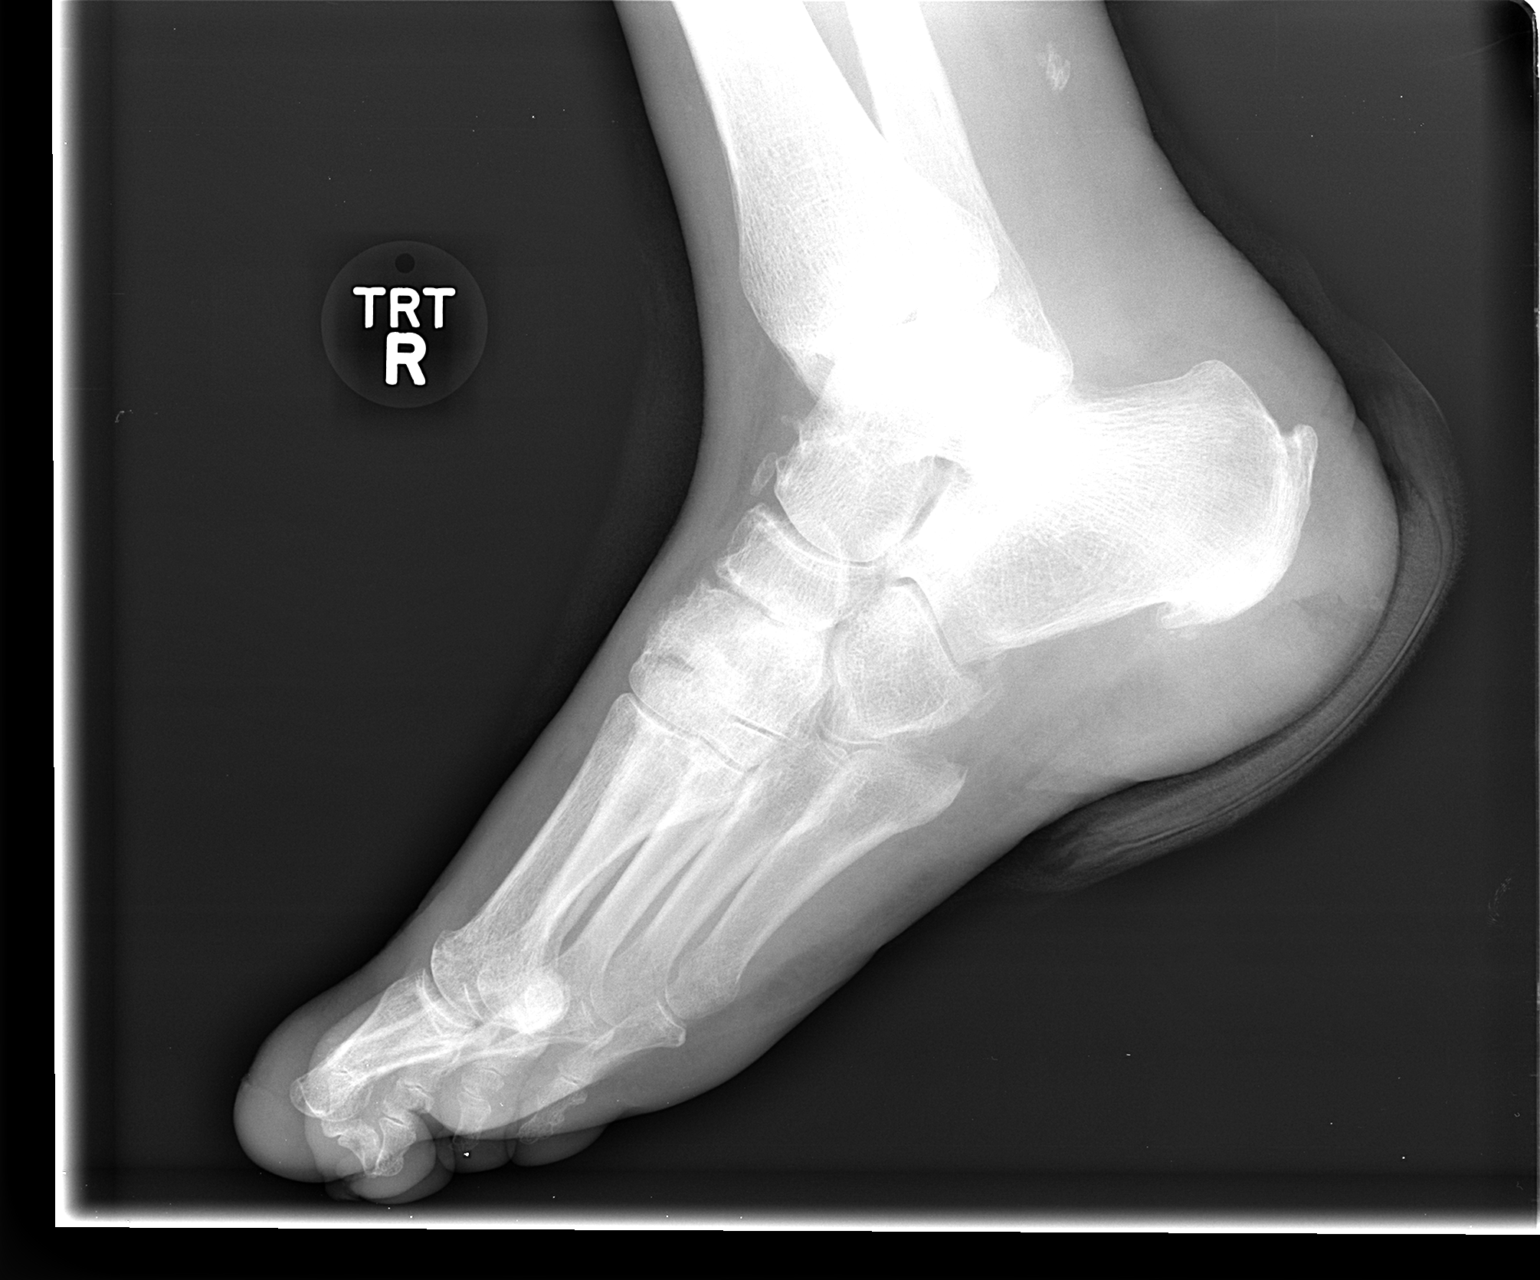

[2 of 2 positions shown; findings below may reference images not displayed]

FINDINGS: Dressing material overlying the right heel.  Increased
soft tissue defect since the comparison.  The mineralization of the
calcaneus underlying this level is stable.  Degenerative spurring
again noted.  Previous amputation of the distal phalanx of the
first toe.  No definite osteolysis or acute fracture or
dislocation.  Degenerative changes again noted at the talonavicular
articulation.
IMPRESSION: Soft tissue defect overlying the calcaneus.  No plain radiographic
evidence of acute osteomyelitis.

## 2013-11-19 ENCOUNTER — Ambulatory Visit (HOSPITAL_COMMUNITY)
Admission: RE | Admit: 2013-11-19 | Discharge: 2013-11-19 | Disposition: A | Discharge status: Home / Self Care | Payer: Medicare HMO | Source: Ambulatory Visit | Attending: Neurology | Admitting: Neurology

## 2013-11-19 ENCOUNTER — Other Ambulatory Visit: Payer: Self-pay | Admitting: Neurology

## 2013-11-19 DIAGNOSIS — M545 Low back pain: Secondary | ICD-10-CM

## 2013-11-19 DIAGNOSIS — M129 Arthropathy, unspecified: Secondary | ICD-10-CM | POA: Insufficient documentation

## 2013-11-19 DIAGNOSIS — I700 Atherosclerosis of aorta: Secondary | ICD-10-CM | POA: Insufficient documentation

## 2013-12-05 ENCOUNTER — Emergency Department (HOSPITAL_COMMUNITY)
Admission: EM | Admit: 2013-12-05 | Discharge: 2013-12-05 | Disposition: A | Discharge status: Home / Self Care | Payer: Medicare HMO | Attending: Emergency Medicine | Admitting: Emergency Medicine

## 2013-12-05 ENCOUNTER — Encounter (HOSPITAL_COMMUNITY): Payer: Self-pay | Admitting: Emergency Medicine

## 2013-12-05 ENCOUNTER — Emergency Department (HOSPITAL_COMMUNITY): Payer: Medicare HMO

## 2013-12-05 DIAGNOSIS — G44209 Tension-type headache, unspecified, not intractable: Secondary | ICD-10-CM | POA: Insufficient documentation

## 2013-12-05 DIAGNOSIS — M129 Arthropathy, unspecified: Secondary | ICD-10-CM | POA: Insufficient documentation

## 2013-12-05 DIAGNOSIS — IMO0001 Reserved for inherently not codable concepts without codable children: Secondary | ICD-10-CM | POA: Insufficient documentation

## 2013-12-05 DIAGNOSIS — D649 Anemia, unspecified: Secondary | ICD-10-CM | POA: Insufficient documentation

## 2013-12-05 DIAGNOSIS — Z794 Long term (current) use of insulin: Secondary | ICD-10-CM | POA: Insufficient documentation

## 2013-12-05 DIAGNOSIS — E1149 Type 2 diabetes mellitus with other diabetic neurological complication: Secondary | ICD-10-CM | POA: Insufficient documentation

## 2013-12-05 DIAGNOSIS — Z79899 Other long term (current) drug therapy: Secondary | ICD-10-CM | POA: Insufficient documentation

## 2013-12-05 DIAGNOSIS — Z7982 Long term (current) use of aspirin: Secondary | ICD-10-CM | POA: Insufficient documentation

## 2013-12-05 DIAGNOSIS — G2581 Restless legs syndrome: Secondary | ICD-10-CM | POA: Insufficient documentation

## 2013-12-05 DIAGNOSIS — M549 Dorsalgia, unspecified: Secondary | ICD-10-CM | POA: Insufficient documentation

## 2013-12-05 DIAGNOSIS — H53149 Visual discomfort, unspecified: Secondary | ICD-10-CM | POA: Insufficient documentation

## 2013-12-05 DIAGNOSIS — I509 Heart failure, unspecified: Secondary | ICD-10-CM | POA: Insufficient documentation

## 2013-12-05 DIAGNOSIS — G4733 Obstructive sleep apnea (adult) (pediatric): Secondary | ICD-10-CM | POA: Insufficient documentation

## 2013-12-05 DIAGNOSIS — R110 Nausea: Secondary | ICD-10-CM | POA: Insufficient documentation

## 2013-12-05 DIAGNOSIS — I499 Cardiac arrhythmia, unspecified: Secondary | ICD-10-CM | POA: Insufficient documentation

## 2013-12-05 DIAGNOSIS — I10 Essential (primary) hypertension: Secondary | ICD-10-CM | POA: Insufficient documentation

## 2013-12-05 LAB — CBC WITH DIFFERENTIAL/PLATELET
BASOS ABS: 0 10*3/uL (ref 0.0–0.1)
BASOS PCT: 0 % (ref 0–1)
EOS PCT: 2 % (ref 0–5)
Eosinophils Absolute: 0.2 10*3/uL (ref 0.0–0.7)
HCT: 37.9 % (ref 36.0–46.0)
Hemoglobin: 12.8 g/dL (ref 12.0–15.0)
LYMPHS ABS: 1.7 10*3/uL (ref 0.7–4.0)
Lymphocytes Relative: 18 % (ref 12–46)
MCH: 28.3 pg (ref 26.0–34.0)
MCHC: 33.8 g/dL (ref 30.0–36.0)
MCV: 83.7 fL (ref 78.0–100.0)
Monocytes Absolute: 0.9 10*3/uL (ref 0.1–1.0)
Monocytes Relative: 9 % (ref 3–12)
NEUTROS PCT: 71 % (ref 43–77)
Neutro Abs: 6.8 10*3/uL (ref 1.7–7.7)
Platelets: 242 10*3/uL (ref 150–400)
RBC: 4.53 MIL/uL (ref 3.87–5.11)
RDW: 14 % (ref 11.5–15.5)
WBC: 9.7 10*3/uL (ref 4.0–10.5)

## 2013-12-05 LAB — COMPREHENSIVE METABOLIC PANEL
ALBUMIN: 3.5 g/dL (ref 3.5–5.2)
ALK PHOS: 95 U/L (ref 39–117)
ALT: 16 U/L (ref 0–35)
AST: 20 U/L (ref 0–37)
Anion gap: 11 (ref 5–15)
BUN: 19 mg/dL (ref 6–23)
CALCIUM: 9 mg/dL (ref 8.4–10.5)
CO2: 29 mEq/L (ref 19–32)
Chloride: 101 mEq/L (ref 96–112)
Creatinine, Ser: 0.95 mg/dL (ref 0.50–1.10)
GFR calc Af Amer: 70 mL/min — ABNORMAL LOW (ref >90)
GFR calc non Af Amer: 61 mL/min — ABNORMAL LOW (ref >90)
GLUCOSE: 203 mg/dL — AB (ref 70–99)
POTASSIUM: 5 meq/L (ref 3.7–5.3)
SODIUM: 141 meq/L (ref 137–147)
TOTAL PROTEIN: 6.9 g/dL (ref 6.0–8.3)
Total Bilirubin: 0.4 mg/dL (ref 0.3–1.2)

## 2013-12-05 LAB — SEDIMENTATION RATE: Sed Rate: 30 mm/hr — ABNORMAL HIGH (ref 0–22)

## 2013-12-05 MED ORDER — METOCLOPRAMIDE HCL 10 MG PO TABS: 10.0000 mg | ORAL_TABLET | Freq: Four times a day (QID) | ORAL | Status: DC | PRN

## 2013-12-05 MED ORDER — MORPHINE SULFATE 2 MG/ML IJ SOLN
2.0000 mg | Freq: Once | INTRAMUSCULAR | Status: AC
  Administered 2013-12-05: 2 mg via INTRAVENOUS
  Filled 2013-12-05: qty 1

## 2013-12-05 MED ORDER — LABETALOL HCL 5 MG/ML IV SOLN
10.0000 mg | Freq: Once | INTRAVENOUS | Status: AC
  Administered 2013-12-05: 10 mg via INTRAVENOUS
  Filled 2013-12-05: qty 4

## 2013-12-05 MED ORDER — OXYCODONE-ACETAMINOPHEN 5-325 MG PO TABS
1.0000 | ORAL_TABLET | Freq: Once | ORAL | Status: AC
  Administered 2013-12-05: 1 via ORAL
  Filled 2013-12-05: qty 1

## 2013-12-05 MED ORDER — IBUPROFEN 400 MG PO TABS: 400.0000 mg | ORAL_TABLET | Freq: Four times a day (QID) | ORAL | Status: DC | PRN

## 2013-12-05 MED ORDER — TRAMADOL HCL 50 MG PO TABS: 50.0000 mg | ORAL_TABLET | Freq: Four times a day (QID) | ORAL | Status: DC | PRN

## 2013-12-05 MED ORDER — METHYLPREDNISOLONE SODIUM SUCC 125 MG IJ SOLR
125.0000 mg | Freq: Once | INTRAMUSCULAR | Status: AC
  Administered 2013-12-05: 125 mg via INTRAVENOUS
  Filled 2013-12-05: qty 2

## 2013-12-05 MED ORDER — KETOROLAC TROMETHAMINE 30 MG/ML IJ SOLN
30.0000 mg | Freq: Once | INTRAMUSCULAR | Status: AC
  Administered 2013-12-05: 30 mg via INTRAVENOUS
  Filled 2013-12-05: qty 1

## 2013-12-05 MED ORDER — LORAZEPAM 2 MG/ML IJ SOLN
0.5000 mg | Freq: Once | INTRAMUSCULAR | Status: AC
  Administered 2013-12-05: 0.5 mg via INTRAVENOUS
  Filled 2013-12-05: qty 1

## 2013-12-05 MED ORDER — METOCLOPRAMIDE HCL 5 MG/ML IJ SOLN
10.0000 mg | Freq: Once | INTRAMUSCULAR | Status: AC
  Administered 2013-12-05: 10 mg via INTRAVENOUS
  Filled 2013-12-05: qty 2

## 2013-12-05 NOTE — Discharge Instructions (Signed)

## 2013-12-05 NOTE — ED Provider Notes (Signed)
CSN: [id number]     Arrival date & time 12/05/13  0522 History   First MD Initiated Contact with Patient 12/05/13 0536     Chief Complaint  Patient presents with  . Headache    Three Days     (Consider location/radiation/quality/duration/timing/severity/associated sxs/prior Treatment) HPI Patient presents with gradual onset bitemporal headaches for the past 3 days. Is worsened overnight. She states is associated with photophobia and mild nausea. She's had no visual changes. She's had no focal weakness or numbness. Denies any neck pain or stiffness. He's had no fever or chills. Past Medical History  Diagnosis Date  . CHF (congestive heart failure)   . Diabetes mellitus   . Restless leg syndrome 07/25/2011  . Wound, open     right foot  . Diabetes mellitus with neuropathy 07/25/2011  . Cellulitis and abscess of foot 02/12/2012  . Osteomyelitis of right foot 02/14/2012  . Partial Achilles tendon tear 02/14/2012  . Dysrhythmia     tachy- takes Metoprolol  . OSA (obstructive sleep apnea) 07/25/2011    not using CPAP  . Arthritis     knees  . Anemia   . Complication of anesthesia     pt states after her hysterectomy the doctor said she was "wild" when she woke up    Past Surgical History  Procedure Laterality Date  . Abdominal hysterectomy    . Achilles tendon repair    . Toe amputation      partial rt great toe  . Below knee leg amputation Right 11/25/2012    Dr Sharol Given  . Amputation Right 11/25/2012    Procedure: AMPUTATION BELOW KNEE;  Surgeon: Newt Minion, MD;  Location: [place];  Service: Orthopedics;  Laterality: Right;  Right Below Knee Amputation  . Breast surgery Left     Lumpectomy non ca  . Cataract extraction w/phaco Right 10/09/2013    Procedure: CATARACT EXTRACTION PHACO AND INTRAOCULAR LENS PLACEMENT (IOC);  Surgeon: Elta Guadeloupe T. Gershon Crane, MD;  Location: [facility];  Service: Ophthalmology;  Laterality: Right;  CDE:9.05  . Cataract extraction w/phaco Left 10/23/2013    Procedure:  ATTEMPTED CATARACT EXTRACTION PHACO AND INTRAOCULAR LENS PLACEMENT;  Surgeon: Elta Guadeloupe T. Gershon Crane, MD;  Location: [facility];  Service: Ophthalmology;  Laterality: Left;  CDE:  4.41   Family History  Problem Relation Age of Onset  . Diabetes Sister    History  Substance Use Topics  . Smoking status: Never Smoker   . Smokeless tobacco: Never Used  . Alcohol Use: No   OB History   Grav Para Term Preterm Abortions TAB SAB Ect Mult Living   1 1 1       1      Review of Systems  Constitutional: Negative for fever and chills.  HENT: Negative for congestion, facial swelling, rhinorrhea, sinus pressure and sore throat.   Eyes: Positive for photophobia. Negative for visual disturbance.  Respiratory: Negative for shortness of breath.   Cardiovascular: Negative for chest pain.  Gastrointestinal: Positive for nausea. Negative for vomiting and abdominal pain.  Musculoskeletal: Positive for back pain and myalgias. Negative for neck pain and neck stiffness.  Skin: Negative for rash and wound.  Neurological: Positive for headaches. Negative for dizziness, weakness, light-headedness and numbness.  All other systems reviewed and are negative.     Allergies  Review of patient's allergies indicates no known allergies.  Home Medications   Prior to Admission medications   Medication Sig Start Date End Date Taking? Authorizing Provider  aspirin  EC 81 MG tablet Take 1 tablet (81 mg total) by mouth daily. 03/17/13  Yes Kathie Dike, MD  cilostazol (PLETAL) 100 MG tablet Take 100 mg by mouth 2 (two) times daily.  02/15/13  Yes Historical Provider, MD  cyclobenzaprine (FLEXERIL) 5 MG tablet Take 5 mg by mouth 2 (two) times daily as needed. For pain/muscle relanxt 06/20/13  Yes Historical Provider, MD  cyclobenzaprine (FLEXERIL) 5 MG tablet Take 1 tablet (5 mg total) by mouth 2 (two) times daily with a meal. 10/04/13  Yes Hope Bunnie Pion, NP  DULoxetine (CYMBALTA) 60 MG capsule Take 60 mg by mouth at bedtime as  needed (for mood/nerves).  08/22/13  Yes Historical Provider, MD  ferrous sulfate 325 (65 FE) MG tablet Take 325 mg by mouth daily with breakfast.   Yes Historical Provider, MD  gabapentin (NEURONTIN) 300 MG capsule Take 600 mg by mouth 3 (three) times daily. 03/28/13  Yes Historical Provider, MD  insulin lispro protamine-lispro (HUMALOG 50/50) (50-50) 100 UNIT/ML SUSP injection Inject 55-90 Units into the skin 3 (three) times daily with meals. 80-90 units in the morning, 80-90 units in the afternoon, and 55 units at bedtime. Based on sliding scale   Yes Historical Provider, MD  Magnesium 100 MG TABS Take 100 mg by mouth daily.   Yes Historical Provider, MD  metoprolol (LOPRESSOR) 50 MG tablet Take 50 mg by mouth 2 (two) times daily.   Yes Historical Provider, MD  Multiple Vitamin (MULTIVITAMIN WITH MINERALS) TABS Take 1 tablet by mouth once a week.    Yes Historical Provider, MD  pravastatin (PRAVACHOL) 40 MG tablet Take 40 mg by mouth daily.   Yes Historical Provider, MD  rOPINIRole (REQUIP) 2 MG tablet Take 2 mg by mouth every evening.    Yes Historical Provider, MD  spironolactone (ALDACTONE) 25 MG tablet Take 25 mg by mouth daily. 02/15/13  Yes Historical Provider, MD  cholecalciferol (VITAMIN D) 1000 UNITS tablet Take 1,000 Units by mouth daily.     Historical Provider, MD   BP 189/90  Pulse 64  Temp(Src) 97.8 F (36.6 C) (Oral)  Resp 18  Ht 5\' 10"  (1.778 m)  Wt 289 lb (131.09 kg)  BMI 41.47 kg/m2  SpO2 98%  LMP 06/01/1983 Physical Exam  Nursing note and vitals reviewed. Constitutional: She is oriented to person, place, and time. She appears well-developed and well-nourished. No distress.  Patient appears mildly uncomfortable.  HENT:  Head: Normocephalic and atraumatic.  Mouth/Throat: Oropharynx is clear and moist.  No sinus tenderness to percussion. Patient does have mild tenderness with palpation over the bilateral temporal region.   Eyes: EOM are normal. Pupils are equal, round,  and reactive to light.  Neck: Normal range of motion. Neck supple.  No meningismus  Cardiovascular: Normal rate and regular rhythm.   Pulmonary/Chest: Effort normal and breath sounds normal. No respiratory distress. She has no wheezes. She has no rales.  Abdominal: Soft. Bowel sounds are normal. She exhibits no distension and no mass. There is no tenderness. There is no rebound and no guarding.  Musculoskeletal: Normal range of motion. She exhibits no edema and no tenderness.  Neurological: She is alert and oriented to person, place, and time.  Patient is alert and oriented x3 with clear, goal oriented speech. Patient has 5/5 motor in all extremities. Sensation is intact to light touch. Bilateral finger-to-nose is normal with no signs of dysmetria.    Skin: Skin is warm and dry. No rash noted. No erythema.  Psychiatric:  She has a normal mood and affect. Her behavior is normal.    ED Course  Procedures (including critical care time) Labs Review Labs Reviewed  CBC WITH DIFFERENTIAL  COMPREHENSIVE METABOLIC PANEL  SEDIMENTATION RATE    Imaging Review No results found.   EKG Interpretation None      MDM   Final diagnoses:  None    Patient presents with 3 days of headache of gradual onset. Is bitemporal. Patient does have some mild tenderness to palpation. She has a normal neurologic exam.  Patient states she continues to have pain despite medication. She's quite anxious appearing. Patient's symptoms likely exacerbated by anxiety.   Patient signed out to oncoming emergency physician pending sedimentation rate. I think temporal arteritis is low likelihood in this patient.  Julianne Rice, MD 12/06/13 [phone]

## 2013-12-05 NOTE — ED Notes (Signed)
Patient complaining of headache for 3 days. Patient states she has taken medication at home with no relief. Per EMS patient was hypertensive PTA. #20g IV placed via EMS RAC

## 2013-12-05 NOTE — ED Provider Notes (Signed)
Pt received at change of shift with sed rate pending. Sed rate results normal for age. Pt wants to go home now, is requesting another dose of pain meds before d/c. Will re-medicate. Dx and testing d/w pt and family.  Questions answered.  Verb understanding, agreeable to d/c home with outpt f/u.   Alfonzo Feller, DO 12/05/13 [phone]

## 2013-12-05 NOTE — ED Notes (Signed)
Patient ambulatory back to room at this time escorted by spouse

## 2013-12-05 NOTE — ED Notes (Signed)
Patient ambulatory to restroom, escorted by spouse

## 2014-01-12 ENCOUNTER — Inpatient Hospital Stay (HOSPITAL_COMMUNITY)
Admission: EM | Admit: 2014-01-12 | Discharge: 2014-01-15 | DRG: 091 | Disposition: A | Discharge status: Home / Self Care | Payer: Medicare HMO | Attending: Internal Medicine | Admitting: Internal Medicine

## 2014-01-12 DIAGNOSIS — R4701 Aphasia: Secondary | ICD-10-CM | POA: Diagnosis present

## 2014-01-12 DIAGNOSIS — Z91199 Patient's noncompliance with other medical treatment and regimen due to unspecified reason: Secondary | ICD-10-CM

## 2014-01-12 DIAGNOSIS — E119 Type 2 diabetes mellitus without complications: Secondary | ICD-10-CM | POA: Diagnosis present

## 2014-01-12 DIAGNOSIS — J9600 Acute respiratory failure, unspecified whether with hypoxia or hypercapnia: Secondary | ICD-10-CM | POA: Diagnosis present

## 2014-01-12 DIAGNOSIS — I509 Heart failure, unspecified: Secondary | ICD-10-CM | POA: Diagnosis present

## 2014-01-12 DIAGNOSIS — Z794 Long term (current) use of insulin: Secondary | ICD-10-CM

## 2014-01-12 DIAGNOSIS — E1165 Type 2 diabetes mellitus with hyperglycemia: Secondary | ICD-10-CM

## 2014-01-12 DIAGNOSIS — G92 Toxic encephalopathy: Secondary | ICD-10-CM | POA: Diagnosis not present

## 2014-01-12 DIAGNOSIS — Z9119 Patient's noncompliance with other medical treatment and regimen: Secondary | ICD-10-CM

## 2014-01-12 DIAGNOSIS — J9801 Acute bronchospasm: Secondary | ICD-10-CM

## 2014-01-12 DIAGNOSIS — G4733 Obstructive sleep apnea (adult) (pediatric): Secondary | ICD-10-CM | POA: Diagnosis present

## 2014-01-12 DIAGNOSIS — Z89511 Acquired absence of right leg below knee: Secondary | ICD-10-CM

## 2014-01-12 DIAGNOSIS — G4700 Insomnia, unspecified: Secondary | ICD-10-CM | POA: Diagnosis present

## 2014-01-12 DIAGNOSIS — Z833 Family history of diabetes mellitus: Secondary | ICD-10-CM

## 2014-01-12 DIAGNOSIS — Z7982 Long term (current) use of aspirin: Secondary | ICD-10-CM

## 2014-01-12 DIAGNOSIS — G929 Unspecified toxic encephalopathy: Principal | ICD-10-CM | POA: Diagnosis present

## 2014-01-12 DIAGNOSIS — G9340 Encephalopathy, unspecified: Secondary | ICD-10-CM | POA: Diagnosis present

## 2014-01-12 DIAGNOSIS — Z6841 Body Mass Index (BMI) 40.0 and over, adult: Secondary | ICD-10-CM

## 2014-01-12 DIAGNOSIS — R739 Hyperglycemia, unspecified: Secondary | ICD-10-CM

## 2014-01-12 DIAGNOSIS — G2581 Restless legs syndrome: Secondary | ICD-10-CM

## 2014-01-12 DIAGNOSIS — Z79899 Other long term (current) drug therapy: Secondary | ICD-10-CM

## 2014-01-12 DIAGNOSIS — E669 Obesity, unspecified: Secondary | ICD-10-CM | POA: Diagnosis present

## 2014-01-12 DIAGNOSIS — F23 Brief psychotic disorder: Secondary | ICD-10-CM

## 2014-01-12 DIAGNOSIS — R410 Disorientation, unspecified: Secondary | ICD-10-CM

## 2014-01-12 DIAGNOSIS — E1149 Type 2 diabetes mellitus with other diabetic neurological complication: Secondary | ICD-10-CM | POA: Diagnosis present

## 2014-01-12 DIAGNOSIS — T380X5A Adverse effect of glucocorticoids and synthetic analogues, initial encounter: Secondary | ICD-10-CM | POA: Diagnosis present

## 2014-01-12 DIAGNOSIS — M129 Arthropathy, unspecified: Secondary | ICD-10-CM | POA: Diagnosis present

## 2014-01-12 DIAGNOSIS — S88119A Complete traumatic amputation at level between knee and ankle, unspecified lower leg, initial encounter: Secondary | ICD-10-CM

## 2014-01-12 DIAGNOSIS — R404 Transient alteration of awareness: Secondary | ICD-10-CM | POA: Diagnosis not present

## 2014-01-12 DIAGNOSIS — I5033 Acute on chronic diastolic (congestive) heart failure: Secondary | ICD-10-CM

## 2014-01-12 DIAGNOSIS — R0902 Hypoxemia: Secondary | ICD-10-CM

## 2014-01-12 DIAGNOSIS — J9601 Acute respiratory failure with hypoxia: Secondary | ICD-10-CM

## 2014-01-12 DIAGNOSIS — R45850 Homicidal ideations: Secondary | ICD-10-CM

## 2014-01-12 DIAGNOSIS — F29 Unspecified psychosis not due to a substance or known physiological condition: Secondary | ICD-10-CM | POA: Diagnosis present

## 2014-01-12 DIAGNOSIS — E1142 Type 2 diabetes mellitus with diabetic polyneuropathy: Secondary | ICD-10-CM | POA: Diagnosis present

## 2014-01-12 DIAGNOSIS — I5032 Chronic diastolic (congestive) heart failure: Secondary | ICD-10-CM | POA: Diagnosis present

## 2014-01-12 DIAGNOSIS — I10 Essential (primary) hypertension: Secondary | ICD-10-CM | POA: Diagnosis present

## 2014-01-12 DIAGNOSIS — Z66 Do not resuscitate: Secondary | ICD-10-CM | POA: Diagnosis present

## 2014-01-12 MED ORDER — SODIUM CHLORIDE 0.9 % IV SOLN
1000.0000 mL | Freq: Once | INTRAVENOUS | Status: AC
  Administered 2014-01-13: 1000 mL via INTRAVENOUS

## 2014-01-12 MED ORDER — METOCLOPRAMIDE HCL 5 MG/ML IJ SOLN
10.0000 mg | Freq: Once | INTRAMUSCULAR | Status: AC
  Administered 2014-01-13: 10 mg via INTRAVENOUS
  Filled 2014-01-12: qty 2

## 2014-01-12 MED ORDER — DIPHENHYDRAMINE HCL 50 MG/ML IJ SOLN
25.0000 mg | Freq: Once | INTRAMUSCULAR | Status: AC
Start: 2014-01-12 — End: 2014-01-13
  Administered 2014-01-13: 25 mg via INTRAVENOUS
  Filled 2014-01-12: qty 1

## 2014-01-12 MED ORDER — SODIUM CHLORIDE 0.9 % IV SOLN
1000.0000 mL | INTRAVENOUS | Status: DC
  Administered 2014-01-13: 1000 mL via INTRAVENOUS

## 2014-01-12 NOTE — ED Notes (Signed)
Pt brought in by [facility] for c/o headache and hyperglycemia; pt's CBG with ems was 548; pt crying and moaning upon arrival and during triage

## 2014-01-12 NOTE — ED Provider Notes (Addendum)
CSN: [id number]     Arrival date & time 01/12/14  2323 History  This chart was scribed for Mary Fuel, MD by Jeanell Sparrow, ED Scribe. This patient was seen in room APA04/APA04 and the patient's care was started at 11:42 PM.   Chief Complaint  Patient presents with  . Headache  . Hyperglycemia   Level 5 Caveat due to distress  Patient is a 68 y.o. female presenting with hyperglycemia. The history is provided by the patient. No language interpreter was used.  Hyperglycemia Associated symptoms: no nausea and no vomiting    HPI Comments: ELLIZA [name] is a 68 y.o. female who presents to the Emergency Department complaining of constant severe frontal headache. Pt denies any nausea or emesis. EMS brought her into the ED for headache and hyperglycemia. Pt is crying and moaning upon arrival.   Past Medical History  Diagnosis Date  . CHF (congestive heart failure)   . Diabetes mellitus   . Restless leg syndrome 07/25/2011  . Wound, open     right foot  . Diabetes mellitus with neuropathy 07/25/2011  . Cellulitis and abscess of foot 02/12/2012  . Osteomyelitis of right foot 02/14/2012  . Partial Achilles tendon tear 02/14/2012  . Dysrhythmia     tachy- takes Metoprolol  . OSA (obstructive sleep apnea) 07/25/2011    not using CPAP  . Arthritis     knees  . Anemia   . Complication of anesthesia     pt states after her hysterectomy the doctor said she was "wild" when she woke up    Past Surgical History  Procedure Laterality Date  . Abdominal hysterectomy    . Achilles tendon repair    . Toe amputation      partial rt great toe  . Below knee leg amputation Right 11/25/2012    Dr Sharol Given  . Amputation Right 11/25/2012    Procedure: AMPUTATION BELOW KNEE;  Surgeon: Newt Minion, MD;  Location: [place];  Service: Orthopedics;  Laterality: Right;  Right Below Knee Amputation  . Breast surgery Left     Lumpectomy non ca  . Cataract extraction w/phaco Right 10/09/2013    Procedure: CATARACT  EXTRACTION PHACO AND INTRAOCULAR LENS PLACEMENT (IOC);  Surgeon: Elta Guadeloupe T. Gershon Crane, MD;  Location: [facility];  Service: Ophthalmology;  Laterality: Right;  CDE:9.05  . Cataract extraction w/phaco Left 10/23/2013    Procedure: ATTEMPTED CATARACT EXTRACTION PHACO AND INTRAOCULAR LENS PLACEMENT;  Surgeon: Elta Guadeloupe T. Gershon Crane, MD;  Location: [facility];  Service: Ophthalmology;  Laterality: Left;  CDE:  4.41   Family History  Problem Relation Age of Onset  . Diabetes Sister    History  Substance Use Topics  . Smoking status: Never Smoker   . Smokeless tobacco: Never Used  . Alcohol Use: No   OB History   Grav Para Term Preterm Abortions TAB SAB Ect Mult Living   1 1 1       1      Review of Systems  Unable to perform ROS: Acuity of condition  Gastrointestinal: Negative for nausea and vomiting.  Neurological: Positive for headaches.  All other systems reviewed and are negative.   Allergies  Review of patient's allergies indicates no known allergies.  Home Medications   Prior to Admission medications   Medication Sig Start Date End Date Taking? Authorizing Provider  aspirin EC 81 MG tablet Take 1 tablet (81 mg total) by mouth daily. 03/17/13   Kathie Dike, MD  cholecalciferol (VITAMIN D)  1000 UNITS tablet Take 1,000 Units by mouth daily.     Historical Provider, MD  cilostazol (PLETAL) 100 MG tablet Take 100 mg by mouth 2 (two) times daily.  02/15/13   Historical Provider, MD  cyclobenzaprine (FLEXERIL) 5 MG tablet Take 5 mg by mouth 2 (two) times daily as needed. For pain/muscle relanxt 06/20/13   Historical Provider, MD  cyclobenzaprine (FLEXERIL) 5 MG tablet Take 1 tablet (5 mg total) by mouth 2 (two) times daily with a meal. 10/04/13   Hope Bunnie Pion, NP  DULoxetine (CYMBALTA) 60 MG capsule Take 60 mg by mouth at bedtime as needed (for mood/nerves).  08/22/13   Historical Provider, MD  ferrous sulfate 325 (65 FE) MG tablet Take 325 mg by mouth daily with breakfast.    Historical Provider, MD   gabapentin (NEURONTIN) 300 MG capsule Take 600 mg by mouth 3 (three) times daily. 03/28/13   Historical Provider, MD  ibuprofen (ADVIL,MOTRIN) 400 MG tablet Take 1 tablet (400 mg total) by mouth every 6 (six) hours as needed. 12/05/13   Julianne Rice, MD  insulin lispro protamine-lispro (HUMALOG 50/50) (50-50) 100 UNIT/ML SUSP injection Inject 55-90 Units into the skin 3 (three) times daily with meals. 80-90 units in the morning, 80-90 units in the afternoon, and 55 units at bedtime. Based on sliding scale    Historical Provider, MD  Magnesium 100 MG TABS Take 100 mg by mouth daily.    Historical Provider, MD  metoCLOPramide (REGLAN) 10 MG tablet Take 1 tablet (10 mg total) by mouth every 6 (six) hours as needed for nausea (nausea/headache). 12/05/13   Julianne Rice, MD  metoprolol (LOPRESSOR) 50 MG tablet Take 50 mg by mouth 2 (two) times daily.    Historical Provider, MD  Multiple Vitamin (MULTIVITAMIN WITH MINERALS) TABS Take 1 tablet by mouth once a week.     Historical Provider, MD  pravastatin (PRAVACHOL) 40 MG tablet Take 40 mg by mouth daily.    Historical Provider, MD  rOPINIRole (REQUIP) 2 MG tablet Take 2 mg by mouth every evening.     Historical Provider, MD  spironolactone (ALDACTONE) 25 MG tablet Take 25 mg by mouth daily. 02/15/13   Historical Provider, MD  traMADol (ULTRAM) 50 MG tablet Take 1 tablet (50 mg total) by mouth every 6 (six) hours as needed. 12/05/13   Julianne Rice, MD   BP 191/87  Pulse 94  Temp(Src) 98 F (36.7 C) (Oral)  Resp 22  Ht 5\' 10"  (1.778 m)  Wt 245 lb (111.131 kg)  BMI 35.15 kg/m2  SpO2 98%  LMP 06/01/1983 Physical Exam  Nursing note and vitals reviewed. Constitutional: She is oriented to person, place, and time. She appears well-developed and well-nourished.  Appears to be in pain. Thrashing about in bed.   HENT:  Head: Normocephalic and atraumatic.  Fundi are normal. Moderate TTP over temporalis muscles.   Eyes: EOM are normal. Pupils are  equal, round, and reactive to light.  Neck: Normal range of motion. Neck supple. No JVD present. No tracheal deviation present.  Cardiovascular: Normal rate, regular rhythm and normal heart sounds.   No murmur heard. Pulmonary/Chest: Effort normal and breath sounds normal. No respiratory distress. She has no wheezes. She has no rales.  Abdominal: Soft. Bowel sounds are normal. She exhibits no distension and no mass. There is no tenderness.  Musculoskeletal: Normal range of motion.  Right below the knee amputation.   Lymphadenopathy:    She has no cervical adenopathy.  Neurological: She  is alert and oriented to person, place, and time. No cranial nerve deficit.  Skin: Skin is warm and dry. No rash noted.  Psychiatric:  Agitated and restless    ED Course  Procedures (including critical care time) DIAGNOSTIC STUDIES: Oxygen Saturation is 98% on RA, normal by my interpretation.    COORDINATION OF CARE: 11:48 PM- Pt advised of plan for treatment which includes medication and labs and pt agrees.  Labs Review Results for orders placed during the hospital encounter of 01/12/14  CBC WITH DIFFERENTIAL      Result Value Ref Range   WBC 10.4  4.0 - 10.5 K/uL   RBC 3.99  3.87 - 5.11 MIL/uL   Hemoglobin 11.2 (*) 12.0 - 15.0 g/dL   HCT 33.5 (*) 36.0 - 46.0 %   MCV 84.0  78.0 - 100.0 fL   MCH 28.1  26.0 - 34.0 pg   MCHC 33.4  30.0 - 36.0 g/dL   RDW 14.1  11.5 - 15.5 %   Platelets 269  150 - 400 K/uL   Neutrophils Relative % 79 (*) 43 - 77 %   Neutro Abs 8.2 (*) 1.7 - 7.7 K/uL   Lymphocytes Relative 11 (*) 12 - 46 %   Lymphs Abs 1.1  0.7 - 4.0 K/uL   Monocytes Relative 10  3 - 12 %   Monocytes Absolute 1.0  0.1 - 1.0 K/uL   Eosinophils Relative 0  0 - 5 %   Eosinophils Absolute 0.0  0.0 - 0.7 K/uL   Basophils Relative 0  0 - 1 %   Basophils Absolute 0.0  0.0 - 0.1 K/uL  BASIC METABOLIC PANEL      Result Value Ref Range   Sodium 134 (*) 137 - 147 mEq/L   Potassium 4.6  3.7 - 5.3 mEq/L    Chloride 97  96 - 112 mEq/L   CO2 25  19 - 32 mEq/L   Glucose, Bld 415 (*) 70 - 99 mg/dL   BUN 28 (*) 6 - 23 mg/dL   Creatinine, Ser 1.01  0.50 - 1.10 mg/dL   Calcium 8.3 (*) 8.4 - 10.5 mg/dL   GFR calc non Af Amer 56 (*) >90 mL/min   GFR calc Af Amer 65 (*) >90 mL/min   Anion gap 12  5 - 15  ETHANOL      Result Value Ref Range   Alcohol, Ethyl (B) <11  0 - 11 mg/dL  URINE RAPID DRUG SCREEN (HOSP PERFORMED)      Result Value Ref Range   Opiates NONE DETECTED  NONE DETECTED   Cocaine NONE DETECTED  NONE DETECTED   Benzodiazepines NONE DETECTED  NONE DETECTED   Amphetamines NONE DETECTED  NONE DETECTED   Tetrahydrocannabinol NONE DETECTED  NONE DETECTED   Barbiturates NONE DETECTED  NONE DETECTED  URINALYSIS, ROUTINE W REFLEX MICROSCOPIC      Result Value Ref Range   Color, Urine YELLOW  YELLOW   APPearance CLEAR  CLEAR   Specific Gravity, Urine 1.010  1.005 - 1.030   pH 7.0  5.0 - 8.0   Glucose, UA >1000 (*) NEGATIVE mg/dL   Hgb urine dipstick MODERATE (*) NEGATIVE   Bilirubin Urine NEGATIVE  NEGATIVE   Ketones, ur NEGATIVE  NEGATIVE mg/dL   Protein, ur 30 (*) NEGATIVE mg/dL   Urobilinogen, UA 0.2  0.0 - 1.0 mg/dL   Nitrite NEGATIVE  NEGATIVE   Leukocytes, UA NEGATIVE  NEGATIVE  URINE MICROSCOPIC-ADD ON  Result Value Ref Range   Squamous Epithelial / LPF RARE  RARE   RBC / HPF 7-10  <3 RBC/hpf  CBG MONITORING, ED      Result Value Ref Range   Glucose-Capillary 254 (*) 70 - 99 mg/dL  CBG MONITORING, ED      Result Value Ref Range   Glucose-Capillary 226 (*) 70 - 99 mg/dL   Comment 1 Documented in Chart     Comment 2 Notify RN     Imaging Review Ct Head Wo Contrast  01/13/2014   CLINICAL DATA:  Headache.  Hyperglycemia.  Altered mental status.  EXAM: CT HEAD WITHOUT CONTRAST  TECHNIQUE: Contiguous axial images were obtained from the base of the skull through the vertex without intravenous contrast.  COMPARISON:  CT of the head performed 12/05/2013  FINDINGS: There  is no evidence of acute infarction, mass lesion, or intra- or extra-axial hemorrhage on CT. Evaluation is suboptimal due to motion artifact.  Mild periventricular white matter change may reflect small vessel ischemic microangiopathy. Mild chronic ischemic change is noted at the anterior limb of the right internal capsule, unchanged from prior studies.  The posterior fossa, including the cerebellum, brainstem and fourth ventricle, is within normal limits. The third and lateral ventricles are unremarkable in appearance. The cerebral hemispheres are symmetric in appearance, with normal gray-white differentiation. No mass effect or midline shift is seen.  There is no evidence of fracture; visualized osseous structures are unremarkable in appearance. The visualized portions of the orbits are within normal limits. The paranasal sinuses and mastoid air cells are well-aerated. No significant soft tissue abnormalities are seen.  IMPRESSION: 1. No acute intracranial pathology seen on CT. 2. Mild small vessel ischemic microangiopathy, with mild chronic ischemic change at the anterior limb of the right internal capsule.   Electronically Signed   By: Garald Balding M.D.   On: 01/13/2014 04:34   CRITICAL CARE Performed by: [id number] Total critical care time: 35 minutes Critical care time was exclusive of separately billable procedures and treating other patients. Critical care was necessary to treat or prevent imminent or life-threatening deterioration. Critical care was time spent personally by me on the following activities: development of treatment plan with patient and/or surrogate as well as nursing, discussions with consultants, evaluation of patient's response to treatment, examination of patient, obtaining history from patient or surrogate, ordering and performing treatments and interventions, ordering and review of laboratory studies, ordering and review of radiographic studies, pulse oximetry and re-evaluation  of patient's condition.  MDM   Final diagnoses:  Acute psychosis  Bronchospasm  Hyperglycemia    Headache of uncertain cause. Patient is very agitated and restless and unable to give an adequate history. Old records are reviewed and she has a prior ED visits for headaches. She will be given a headache cocktail and reassess.  Following IV fluids, metoclopramide, and diphenhydramine there was little improvement. She is given a dose of ketorolac.  Following control IV ketorolac, headache is much better and she states he wants to go home. However, her husband states that she has been threatening to hurt him. RN heard her say that she wanted to get her gun. When I asked her about homicidal and suicidal ideation, patient adamantly denied them but consultation will be obtained with TTS to assess her psychiatric stability. Upon hearing that she could not go home, she became very agitated and she was given a dose of ziprasidone.  TTS notes scattered throughout thought process and recommends she be  held for psychiatric evaluation.  Patient had recurrence of severe agitation requiring a second injection of ziprasidone. She also developed wheezing and a brief episode of hypoxia with oxygen saturation going down to 90%. This is due to nebulizer treatment with albuterol and ipratropium. Urinalysis does not show any evidence of infection. I do not find a medical cause for her condition.  I personally performed the services described in this documentation, which was scribed in my presence. The recorded information has been reviewed and is accurate.      Mary Fuel, MD [id number]  Mary Fuel, MD [id number]  Shaheen Mende, MD [id number]

## 2014-01-13 ENCOUNTER — Emergency Department (HOSPITAL_COMMUNITY): Payer: Medicare HMO

## 2014-01-13 ENCOUNTER — Encounter (HOSPITAL_COMMUNITY): Payer: Self-pay | Admitting: *Deleted

## 2014-01-13 DIAGNOSIS — Z66 Do not resuscitate: Secondary | ICD-10-CM | POA: Diagnosis present

## 2014-01-13 DIAGNOSIS — R410 Disorientation, unspecified: Secondary | ICD-10-CM | POA: Diagnosis present

## 2014-01-13 DIAGNOSIS — I5032 Chronic diastolic (congestive) heart failure: Secondary | ICD-10-CM | POA: Diagnosis present

## 2014-01-13 DIAGNOSIS — Z794 Long term (current) use of insulin: Secondary | ICD-10-CM | POA: Diagnosis not present

## 2014-01-13 DIAGNOSIS — E1149 Type 2 diabetes mellitus with other diabetic neurological complication: Secondary | ICD-10-CM | POA: Diagnosis present

## 2014-01-13 DIAGNOSIS — R4701 Aphasia: Secondary | ICD-10-CM | POA: Diagnosis present

## 2014-01-13 DIAGNOSIS — E119 Type 2 diabetes mellitus without complications: Secondary | ICD-10-CM

## 2014-01-13 DIAGNOSIS — I509 Heart failure, unspecified: Secondary | ICD-10-CM

## 2014-01-13 DIAGNOSIS — Z6841 Body Mass Index (BMI) 40.0 and over, adult: Secondary | ICD-10-CM | POA: Diagnosis not present

## 2014-01-13 DIAGNOSIS — I10 Essential (primary) hypertension: Secondary | ICD-10-CM | POA: Diagnosis present

## 2014-01-13 DIAGNOSIS — J9600 Acute respiratory failure, unspecified whether with hypoxia or hypercapnia: Secondary | ICD-10-CM | POA: Diagnosis present

## 2014-01-13 DIAGNOSIS — M129 Arthropathy, unspecified: Secondary | ICD-10-CM | POA: Diagnosis present

## 2014-01-13 DIAGNOSIS — Z79899 Other long term (current) drug therapy: Secondary | ICD-10-CM | POA: Diagnosis not present

## 2014-01-13 DIAGNOSIS — G2581 Restless legs syndrome: Secondary | ICD-10-CM | POA: Diagnosis present

## 2014-01-13 DIAGNOSIS — E669 Obesity, unspecified: Secondary | ICD-10-CM | POA: Diagnosis present

## 2014-01-13 DIAGNOSIS — R404 Transient alteration of awareness: Secondary | ICD-10-CM | POA: Diagnosis present

## 2014-01-13 DIAGNOSIS — G929 Unspecified toxic encephalopathy: Secondary | ICD-10-CM | POA: Diagnosis present

## 2014-01-13 DIAGNOSIS — T380X5A Adverse effect of glucocorticoids and synthetic analogues, initial encounter: Secondary | ICD-10-CM | POA: Diagnosis present

## 2014-01-13 DIAGNOSIS — E1142 Type 2 diabetes mellitus with diabetic polyneuropathy: Secondary | ICD-10-CM | POA: Diagnosis present

## 2014-01-13 DIAGNOSIS — Z91199 Patient's noncompliance with other medical treatment and regimen due to unspecified reason: Secondary | ICD-10-CM | POA: Diagnosis not present

## 2014-01-13 DIAGNOSIS — S88119A Complete traumatic amputation at level between knee and ankle, unspecified lower leg, initial encounter: Secondary | ICD-10-CM | POA: Diagnosis not present

## 2014-01-13 DIAGNOSIS — G92 Toxic encephalopathy: Secondary | ICD-10-CM | POA: Diagnosis present

## 2014-01-13 DIAGNOSIS — G9340 Encephalopathy, unspecified: Secondary | ICD-10-CM

## 2014-01-13 DIAGNOSIS — G4700 Insomnia, unspecified: Secondary | ICD-10-CM | POA: Diagnosis present

## 2014-01-13 DIAGNOSIS — R0902 Hypoxemia: Secondary | ICD-10-CM

## 2014-01-13 DIAGNOSIS — R45850 Homicidal ideations: Secondary | ICD-10-CM | POA: Diagnosis not present

## 2014-01-13 DIAGNOSIS — G4733 Obstructive sleep apnea (adult) (pediatric): Secondary | ICD-10-CM | POA: Diagnosis present

## 2014-01-13 DIAGNOSIS — I5033 Acute on chronic diastolic (congestive) heart failure: Secondary | ICD-10-CM | POA: Diagnosis present

## 2014-01-13 DIAGNOSIS — F29 Unspecified psychosis not due to a substance or known physiological condition: Secondary | ICD-10-CM

## 2014-01-13 DIAGNOSIS — Z7982 Long term (current) use of aspirin: Secondary | ICD-10-CM | POA: Diagnosis not present

## 2014-01-13 DIAGNOSIS — Z833 Family history of diabetes mellitus: Secondary | ICD-10-CM | POA: Diagnosis not present

## 2014-01-13 LAB — BLOOD GAS, ARTERIAL
ACID-BASE EXCESS: 1.4 mmol/L (ref 0.0–2.0)
Bicarbonate: 25.7 mEq/L — ABNORMAL HIGH (ref 20.0–24.0)
Drawn by: 23534
FIO2: 0.21 %
O2 CONTENT: 21 L/min
O2 Saturation: 89.3 %
PATIENT TEMPERATURE: 37
PCO2 ART: 42.2 mmHg (ref 35.0–45.0)
PH ART: 7.401 (ref 7.350–7.450)
TCO2: 22.9 mmol/L (ref 0–100)
pO2, Arterial: 58.4 mmHg — ABNORMAL LOW (ref 80.0–100.0)

## 2014-01-13 LAB — RAPID URINE DRUG SCREEN, HOSP PERFORMED
AMPHETAMINES: NOT DETECTED
BARBITURATES: NOT DETECTED
BENZODIAZEPINES: NOT DETECTED
Cocaine: NOT DETECTED
Opiates: NOT DETECTED
Tetrahydrocannabinol: NOT DETECTED

## 2014-01-13 LAB — URINE MICROSCOPIC-ADD ON

## 2014-01-13 LAB — HEMOGLOBIN A1C
Hgb A1c MFr Bld: 10.9 % — ABNORMAL HIGH (ref <5.7)
Mean Plasma Glucose: 266 mg/dL — ABNORMAL HIGH (ref <117)

## 2014-01-13 LAB — CBC WITH DIFFERENTIAL/PLATELET
BASOS ABS: 0 10*3/uL (ref 0.0–0.1)
Basophils Relative: 0 % (ref 0–1)
EOS ABS: 0 10*3/uL (ref 0.0–0.7)
Eosinophils Relative: 0 % (ref 0–5)
HEMATOCRIT: 33.5 % — AB (ref 36.0–46.0)
Hemoglobin: 11.2 g/dL — ABNORMAL LOW (ref 12.0–15.0)
LYMPHS PCT: 11 % — AB (ref 12–46)
Lymphs Abs: 1.1 10*3/uL (ref 0.7–4.0)
MCH: 28.1 pg (ref 26.0–34.0)
MCHC: 33.4 g/dL (ref 30.0–36.0)
MCV: 84 fL (ref 78.0–100.0)
MONO ABS: 1 10*3/uL (ref 0.1–1.0)
Monocytes Relative: 10 % (ref 3–12)
Neutro Abs: 8.2 10*3/uL — ABNORMAL HIGH (ref 1.7–7.7)
Neutrophils Relative %: 79 % — ABNORMAL HIGH (ref 43–77)
Platelets: 269 10*3/uL (ref 150–400)
RBC: 3.99 MIL/uL (ref 3.87–5.11)
RDW: 14.1 % (ref 11.5–15.5)
WBC: 10.4 10*3/uL (ref 4.0–10.5)

## 2014-01-13 LAB — GLUCOSE, CAPILLARY
GLUCOSE-CAPILLARY: 79 mg/dL (ref 70–99)
Glucose-Capillary: 82 mg/dL (ref 70–99)
Glucose-Capillary: 103 mg/dL — ABNORMAL HIGH (ref 70–99)
Glucose-Capillary: 172 mg/dL — ABNORMAL HIGH (ref 70–99)

## 2014-01-13 LAB — URINALYSIS, ROUTINE W REFLEX MICROSCOPIC
Bilirubin Urine: NEGATIVE
Ketones, ur: NEGATIVE mg/dL
Leukocytes, UA: NEGATIVE
Nitrite: NEGATIVE
PROTEIN: 30 mg/dL — AB
Specific Gravity, Urine: 1.01 (ref 1.005–1.030)
Urobilinogen, UA: 0.2 mg/dL (ref 0.0–1.0)
pH: 7 (ref 5.0–8.0)

## 2014-01-13 LAB — BASIC METABOLIC PANEL
Anion gap: 12 (ref 5–15)
BUN: 28 mg/dL — ABNORMAL HIGH (ref 6–23)
CALCIUM: 8.3 mg/dL — AB (ref 8.4–10.5)
CO2: 25 meq/L (ref 19–32)
Chloride: 97 mEq/L (ref 96–112)
Creatinine, Ser: 1.01 mg/dL (ref 0.50–1.10)
GFR calc Af Amer: 65 mL/min — ABNORMAL LOW (ref >90)
GFR, EST NON AFRICAN AMERICAN: 56 mL/min — AB (ref >90)
Glucose, Bld: 415 mg/dL — ABNORMAL HIGH (ref 70–99)
Potassium: 4.6 mEq/L (ref 3.7–5.3)
Sodium: 134 mEq/L — ABNORMAL LOW (ref 137–147)

## 2014-01-13 LAB — ETHANOL: Alcohol, Ethyl (B): <11 mg/dL (ref 0–11)

## 2014-01-13 LAB — D-DIMER, QUANTITATIVE: D-Dimer, Quant: 0.44 ug/mL-FEU (ref 0.00–0.48)

## 2014-01-13 LAB — TSH: TSH: 2.66 u[IU]/mL (ref 0.350–4.500)

## 2014-01-13 LAB — CBG MONITORING, ED
GLUCOSE-CAPILLARY: 226 mg/dL — AB (ref 70–99)
Glucose-Capillary: 254 mg/dL — ABNORMAL HIGH (ref 70–99)
Glucose-Capillary: 172 mg/dL — ABNORMAL HIGH (ref 70–99)
Glucose-Capillary: 147 mg/dL — ABNORMAL HIGH (ref 70–99)
Glucose-Capillary: 96 mg/dL (ref 70–99)

## 2014-01-13 LAB — PRO B NATRIURETIC PEPTIDE: Pro B Natriuretic peptide (BNP): 4362 pg/mL — ABNORMAL HIGH (ref 0–125)

## 2014-01-13 LAB — AMMONIA: AMMONIA: 38 umol/L (ref 11–60)

## 2014-01-13 LAB — TROPONIN I

## 2014-01-13 MED ORDER — DEXTROSE 50 % IV SOLN
INTRAVENOUS | Status: AC
  Filled 2014-01-13: qty 50

## 2014-01-13 MED ORDER — ZOLPIDEM TARTRATE 5 MG PO TABS: 5.0000 mg | ORAL_TABLET | Freq: Every evening | ORAL | Status: DC | PRN

## 2014-01-13 MED ORDER — INSULIN ASPART 100 UNIT/ML ~~LOC~~ SOLN
0.0000 [IU] | SUBCUTANEOUS | Status: DC
  Administered 2014-01-14 (×2): 5 [IU] via SUBCUTANEOUS
  Administered 2014-01-14 – 2014-01-15 (×2): 2 [IU] via SUBCUTANEOUS

## 2014-01-13 MED ORDER — KETOROLAC TROMETHAMINE 30 MG/ML IJ SOLN
30.0000 mg | Freq: Once | INTRAMUSCULAR | Status: AC
  Administered 2014-01-13: 30 mg via INTRAVENOUS
  Filled 2014-01-13: qty 1

## 2014-01-13 MED ORDER — ASPIRIN EC 81 MG PO TBEC
81.0000 mg | DELAYED_RELEASE_TABLET | Freq: Every day | ORAL | Status: DC
  Administered 2014-01-13: 81 mg via ORAL
  Filled 2014-01-13 (×2): qty 1

## 2014-01-13 MED ORDER — STERILE WATER FOR INJECTION IJ SOLN
INTRAMUSCULAR | Status: AC
  Filled 2014-01-13: qty 10

## 2014-01-13 MED ORDER — FUROSEMIDE 10 MG/ML IJ SOLN
40.0000 mg | Freq: Two times a day (BID) | INTRAMUSCULAR | Status: DC
  Administered 2014-01-14: 40 mg via INTRAVENOUS
  Filled 2014-01-13: qty 4

## 2014-01-13 MED ORDER — ROPINIROLE HCL 1 MG PO TABS
ORAL_TABLET | ORAL | Status: AC
  Filled 2014-01-13: qty 2

## 2014-01-13 MED ORDER — LORAZEPAM 2 MG/ML IJ SOLN: 1.0000 mg | INTRAMUSCULAR | Status: DC | PRN

## 2014-01-13 MED ORDER — ENOXAPARIN SODIUM 40 MG/0.4ML ~~LOC~~ SOLN: 40.0000 mg | SUBCUTANEOUS | Status: DC

## 2014-01-13 MED ORDER — ZIPRASIDONE MESYLATE 20 MG IM SOLR
INTRAMUSCULAR | Status: AC
  Filled 2014-01-13: qty 20

## 2014-01-13 MED ORDER — FUROSEMIDE 10 MG/ML IJ SOLN
40.0000 mg | Freq: Once | INTRAMUSCULAR | Status: AC
  Administered 2014-01-13: 40 mg via INTRAVENOUS
  Filled 2014-01-13: qty 4

## 2014-01-13 MED ORDER — SPIRONOLACTONE 25 MG PO TABS
25.0000 mg | ORAL_TABLET | Freq: Every day | ORAL | Status: DC
  Administered 2014-01-13 – 2014-01-15 (×3): 25 mg via ORAL
  Filled 2014-01-13 (×3): qty 1

## 2014-01-13 MED ORDER — INSULIN ASPART PROT & ASPART (70-30 MIX) 100 UNIT/ML ~~LOC~~ SUSP
80.0000 [IU] | Freq: Every day | SUBCUTANEOUS | Status: DC
  Administered 2014-01-13: 80 [IU] via SUBCUTANEOUS
  Filled 2014-01-13: qty 10

## 2014-01-13 MED ORDER — ROPINIROLE HCL 1 MG PO TABS
2.0000 mg | ORAL_TABLET | Freq: Every evening | ORAL | Status: DC
  Administered 2014-01-13 – 2014-01-14 (×2): 2 mg via ORAL
  Filled 2014-01-13 (×3): qty 2

## 2014-01-13 MED ORDER — ALUM & MAG HYDROXIDE-SIMETH 200-200-20 MG/5ML PO SUSP: 30.0000 mL | ORAL | Status: DC | PRN

## 2014-01-13 MED ORDER — INSULIN ASPART 100 UNIT/ML ~~LOC~~ SOLN
10.0000 [IU] | Freq: Once | SUBCUTANEOUS | Status: AC
  Administered 2014-01-13: 10 [IU] via INTRAVENOUS
  Filled 2014-01-13: qty 1

## 2014-01-13 MED ORDER — ADULT MULTIVITAMIN W/MINERALS CH: 1.0000 | ORAL_TABLET | ORAL | Status: DC

## 2014-01-13 MED ORDER — METOPROLOL TARTRATE 50 MG PO TABS
50.0000 mg | ORAL_TABLET | Freq: Two times a day (BID) | ORAL | Status: DC
  Administered 2014-01-13 – 2014-01-15 (×4): 50 mg via ORAL
  Filled 2014-01-13 (×5): qty 1

## 2014-01-13 MED ORDER — DEXTROSE-NACL 5-0.9 % IV SOLN
INTRAVENOUS | Status: DC
  Administered 2014-01-13 – 2014-01-15 (×2): via INTRAVENOUS

## 2014-01-13 MED ORDER — ALBUTEROL SULFATE (2.5 MG/3ML) 0.083% IN NEBU: 2.5000 mg | INHALATION_SOLUTION | RESPIRATORY_TRACT | Status: DC | PRN

## 2014-01-13 MED ORDER — CILOSTAZOL 100 MG PO TABS
100.0000 mg | ORAL_TABLET | Freq: Two times a day (BID) | ORAL | Status: DC
  Administered 2014-01-13 – 2014-01-15 (×5): 100 mg via ORAL
  Filled 2014-01-13 (×7): qty 1

## 2014-01-13 MED ORDER — SIMVASTATIN 20 MG PO TABS
20.0000 mg | ORAL_TABLET | Freq: Every day | ORAL | Status: DC
  Administered 2014-01-14: 20 mg via ORAL
  Filled 2014-01-13 (×2): qty 1

## 2014-01-13 MED ORDER — ZIPRASIDONE MESYLATE 20 MG IM SOLR
10.0000 mg | Freq: Once | INTRAMUSCULAR | Status: AC
  Administered 2014-01-13: 10 mg via INTRAMUSCULAR
  Filled 2014-01-13: qty 20

## 2014-01-13 MED ORDER — LORAZEPAM 1 MG PO TABS
1.0000 mg | ORAL_TABLET | Freq: Three times a day (TID) | ORAL | Status: DC | PRN
  Administered 2014-01-13: 1 mg via ORAL
  Filled 2014-01-13: qty 1

## 2014-01-13 MED ORDER — INSULIN ASPART 100 UNIT/ML ~~LOC~~ SOLN
0.0000 [IU] | Freq: Three times a day (TID) | SUBCUTANEOUS | Status: DC
  Administered 2014-01-13: 3 [IU] via SUBCUTANEOUS
  Administered 2014-01-13: 2 [IU] via SUBCUTANEOUS
  Filled 2014-01-13 (×2): qty 1

## 2014-01-13 MED ORDER — DULOXETINE HCL 60 MG PO CPEP
60.0000 mg | ORAL_CAPSULE | Freq: Every evening | ORAL | Status: DC | PRN
  Filled 2014-01-13: qty 1

## 2014-01-13 MED ORDER — INSULIN ASPART PROT & ASPART (70-30 MIX) 100 UNIT/ML ~~LOC~~ SUSP
55.0000 [IU] | Freq: Every day | SUBCUTANEOUS | Status: DC
  Administered 2014-01-13: 55 [IU] via SUBCUTANEOUS

## 2014-01-13 MED ORDER — IPRATROPIUM BROMIDE 0.02 % IN SOLN: 0.5000 mg | Freq: Once | RESPIRATORY_TRACT | Status: DC

## 2014-01-13 MED ORDER — MAGNESIUM 100 MG PO TABS: 100.0000 mg | ORAL_TABLET | Freq: Every day | ORAL | Status: DC

## 2014-01-13 MED ORDER — LISINOPRIL 5 MG PO TABS
2.5000 mg | ORAL_TABLET | Freq: Every day | ORAL | Status: DC
  Administered 2014-01-14: 2.5 mg via ORAL
  Filled 2014-01-13: qty 1

## 2014-01-13 MED ORDER — FERROUS SULFATE 325 (65 FE) MG PO TABS
325.0000 mg | ORAL_TABLET | Freq: Every day | ORAL | Status: DC
Start: 2014-01-13 — End: 2014-01-13
  Administered 2014-01-13: 325 mg via ORAL
  Filled 2014-01-13 (×2): qty 1

## 2014-01-13 MED ORDER — ONDANSETRON HCL 4 MG PO TABS: 4.0000 mg | ORAL_TABLET | Freq: Three times a day (TID) | ORAL | Status: DC | PRN

## 2014-01-13 MED ORDER — INSULIN ASPART PROT & ASPART (70-30 MIX) 100 UNIT/ML ~~LOC~~ SUSP: 80.0000 [IU] | Freq: Every day | SUBCUTANEOUS | Status: DC

## 2014-01-13 MED ORDER — STROKE: EARLY STAGES OF RECOVERY BOOK
Freq: Once | Status: AC
  Administered 2014-01-14: 10:00:00
  Filled 2014-01-13: qty 1

## 2014-01-13 MED ORDER — VITAMIN D 1000 UNITS PO TABS
1000.0000 [IU] | ORAL_TABLET | Freq: Every day | ORAL | Status: DC
  Administered 2014-01-13 – 2014-01-15 (×3): 1000 [IU] via ORAL
  Filled 2014-01-13 (×3): qty 1

## 2014-01-13 MED ORDER — IPRATROPIUM-ALBUTEROL 0.5-2.5 (3) MG/3ML IN SOLN
3.0000 mL | Freq: Once | RESPIRATORY_TRACT | Status: AC
  Administered 2014-01-13: 3 mL via RESPIRATORY_TRACT
  Filled 2014-01-13: qty 3

## 2014-01-13 MED ORDER — ZIPRASIDONE MESYLATE 20 MG IM SOLR
10.0000 mg | Freq: Once | INTRAMUSCULAR | Status: AC
  Administered 2014-01-13: 10 mg via INTRAMUSCULAR

## 2014-01-13 MED ORDER — ACETAMINOPHEN 325 MG PO TABS: 650.0000 mg | ORAL_TABLET | ORAL | Status: DC | PRN

## 2014-01-13 MED ORDER — ALBUTEROL SULFATE (2.5 MG/3ML) 0.083% IN NEBU
2.5000 mg | INHALATION_SOLUTION | Freq: Once | RESPIRATORY_TRACT | Status: AC
  Administered 2014-01-13: 2.5 mg via RESPIRATORY_TRACT
  Filled 2014-01-13: qty 3

## 2014-01-13 MED ORDER — GABAPENTIN 300 MG PO CAPS
600.0000 mg | ORAL_CAPSULE | Freq: Three times a day (TID) | ORAL | Status: DC
  Administered 2014-01-13 – 2014-01-15 (×7): 600 mg via ORAL
  Filled 2014-01-13 (×9): qty 2

## 2014-01-13 MED ORDER — ASPIRIN 325 MG PO TABS
325.0000 mg | ORAL_TABLET | Freq: Every day | ORAL | Status: DC
  Administered 2014-01-14 – 2014-01-15 (×2): 325 mg via ORAL
  Filled 2014-01-13 (×2): qty 1

## 2014-01-13 MED ORDER — IBUPROFEN 600 MG PO TABS: 600.0000 mg | ORAL_TABLET | Freq: Three times a day (TID) | ORAL | Status: DC | PRN

## 2014-01-13 MED ORDER — FUROSEMIDE 10 MG/ML IJ SOLN
60.0000 mg | Freq: Once | INTRAMUSCULAR | Status: AC
  Administered 2014-01-13: 60 mg via INTRAVENOUS
  Filled 2014-01-13: qty 6

## 2014-01-13 MED ORDER — DEXTROSE 50 % IV SOLN
25.0000 mL | Freq: Once | INTRAVENOUS | Status: AC
  Administered 2014-01-13: 25 mL via INTRAVENOUS

## 2014-01-13 MED ORDER — ALBUTEROL SULFATE (2.5 MG/3ML) 0.083% IN NEBU
5.0000 mg | INHALATION_SOLUTION | Freq: Once | RESPIRATORY_TRACT | Status: DC
Start: 2014-01-13 — End: 2014-01-13

## 2014-01-13 NOTE — ED Notes (Addendum)
Spoke with Raquel Sarna at [facility], reports that psychiatrist has not come into work yet. Once psychiatrist comes in pt will be evaluated via telepsych. EDP aware no new orders given.

## 2014-01-13 NOTE — ED Notes (Signed)
MD at bedside. Pt asleep but arousable and drowsy at time of MD assessment.

## 2014-01-13 NOTE — BH Assessment (Signed)
Assessment Note  Mary [name] is an 68 y.o. married female BIB to ED by her husband due to complaints of headache, high blood pressure, high blood sugar, and marked change in temperament. Pt has a history of being treated in ED for headaches, but sts no prior mental health history. She denies SI/HI, self-harm, and a/v hallucinations. Pt denies history of substance use. Pt has a history of congestive heart failure, diabetes, and other medical ailments.   Pt was initially treated for the headache and then her husband reported he could not take her home as she was threatening to kill him. Nurses overheard pt making comments about going to get her gun.   Pt is drowsy, and agitated. She is oriented to person, and place, but is unsure time and unclear about situation. As the interviewed progressed pt had difficulties with speech, as her speech pattern became disorganized and incoherent at times. She became frustrated with the difficulty in communicating what she wanted to say and then began to refuse to participate in the assessment. Information obtained was limited by the state of the patient. Pt stated she wanted to go home and then began yelling, "Please don't let me go home, " repeatedly she denied this when asked for clarification stating she only said she wanted to go home.  When asked why she was in the ER, pt stated angrily that her husband made her come because she could not speak the way she wanted to. Pt repeatedly try to communicate why she had been upset with her husband and was unable to do so, until husband reminded her that she had wanted him to call the preacher. Pt confirmed she wanted husband to call preacher and became upset when he did not. Husband reports pt had a headache, high blood pressure, and high blood sugar. They attempted to treat this at home but it did not improve and then pt became very angry and irritable. "She is normally very sweet," "I have never seen her like this  before, " "She was very irritated." Medical records do not indicate a history of confusion or temperament change with headaches in the past.   When asked about mental health symptoms pt denied any symptoms both now or in the past. When asked if she remembered making any threats to harm anyone today, or talking about a gun she stated she did not. She reports she does not know where he husband keeps her gun.    Axis I:  294.90 Unspecified Mental Disorder Due to Another Medical Condition Rule Out Axis II: deferred  Axis III: diabetes, CHF, restless leg syndrome, multiple medical ailments Axis IV: problems with primary support group Axis V: 31-40 some impairment in reality testing      Past Medical History:  Past Medical History  Diagnosis Date  . CHF (congestive heart failure)   . Diabetes mellitus   . Restless leg syndrome 07/25/2011  . Wound, open     right foot  . Diabetes mellitus with neuropathy 07/25/2011  . Cellulitis and abscess of foot 02/12/2012  . Osteomyelitis of right foot 02/14/2012  . Partial Achilles tendon tear 02/14/2012  . Dysrhythmia     tachy- takes Metoprolol  . OSA (obstructive sleep apnea) 07/25/2011    not using CPAP  . Arthritis     knees  . Anemia   . Complication of anesthesia     pt states after her hysterectomy the doctor said she was "wild" when she woke up  Past Surgical History  Procedure Laterality Date  . Abdominal hysterectomy    . Achilles tendon repair    . Toe amputation      partial rt great toe  . Below knee leg amputation Right 11/25/2012    Dr Sharol Given  . Amputation Right 11/25/2012    Procedure: AMPUTATION BELOW KNEE;  Surgeon: Newt Minion, MD;  Location: [place];  Service: Orthopedics;  Laterality: Right;  Right Below Knee Amputation  . Breast surgery Left     Lumpectomy non ca  . Cataract extraction w/phaco Right 10/09/2013    Procedure: CATARACT EXTRACTION PHACO AND INTRAOCULAR LENS PLACEMENT (IOC);  Surgeon: Elta Guadeloupe T. Gershon Crane, MD;   Location: [facility];  Service: Ophthalmology;  Laterality: Right;  CDE:9.05  . Cataract extraction w/phaco Left 10/23/2013    Procedure: ATTEMPTED CATARACT EXTRACTION PHACO AND INTRAOCULAR LENS PLACEMENT;  Surgeon: Elta Guadeloupe T. Gershon Crane, MD;  Location: [facility];  Service: Ophthalmology;  Laterality: Left;  CDE:  4.41    Family History:  Family History  Problem Relation Age of Onset  . Diabetes Sister     Social History:  reports that she has never smoked. She has never used smokeless tobacco. She reports that she does not drink alcohol or use illicit drugs.  Additional Social History:  Alcohol / Drug Use Pain Medications: See MAR Prescriptions: see MAR Over the Counter: see MAR History of alcohol / drug use?: No history of alcohol / drug abuse (denies and use of alcohol or drugs) Longest period of sobriety (when/how long): n/a  CIWA: CIWA-Ar BP: 191/87 mmHg Pulse Rate: 94 COWS:    Allergies: No Known Allergies  Home Medications:  (Not in a hospital admission)  OB/GYN Status:  Patient's last menstrual period was 06/01/1983.  General Assessment Data Location of Assessment: [facility] Is this a Tele or Face-to-Face Assessment?: Tele Assessment Is this an Initial Assessment or a Re-assessment for this encounter?: Initial Assessment Living Arrangements: Spouse/significant other Mary [name]) Can pt return to current living arrangement?: Yes Admission Status:  (IVC is being sought) Is patient capable of signing voluntary admission?: No Transfer from: Home Referral Source: Self/Family/Friend     [place] Living Arrangements: Spouse/significant other Mary [name]) Name of Psychiatrist: none Name of Therapist: none  Education Status Is patient currently in school?: No Current Grade: n/a Name of school: n/a Contact person: n/a  Risk to self with the past 6 months Suicidal Ideation: No Suicidal Intent: No Is patient at risk for suicide?: No Suicidal Plan?: No Access to  Means: No What has been your use of drugs/alcohol within the last 12 months?: none Previous Attempts/Gestures: No How many times?: 0 Other Self Harm Risks: none Triggers for Past Attempts: None known Intentional Self Injurious Behavior: None Family Suicide History: No Recent stressful life event(s):  (reports none) Persecutory voices/beliefs?: No Depression: No Depression Symptoms: Feeling angry/irritable Substance abuse history and/or treatment for substance abuse?: No Suicide prevention information given to non-admitted patients: Not applicable  Risk to Others within the past 6 months Homicidal Ideation: Yes-Currently Present Thoughts of Harm to Others: No Current Homicidal Intent:  (pt denies but was overheard stating she wants to get gun) Current Homicidal Plan: Yes-Currently Present Describe Current Homicidal Plan: shooting Access to Homicidal Means:  (gun in home, pt denies knowing where it is) Identified Victim: Husband Mary [name] per his report History of harm to others?: No Assessment of Violence: On admission Violent Behavior Description: husband reports she has been threatening to kill  him Does patient have access to weapons?: Yes (Comment) Criminal Charges Pending?: No Does patient have a court date: No  Psychosis Hallucinations: None noted Delusions: None noted  Mental Status Report Appear/Hygiene: Disheveled Eye Contact: Poor Motor Activity: Agitation Speech: Word salad (with periods of irritated coherence) Level of Consciousness: Drowsy Mood: Irritable Affect:  (consistent with mood) Anxiety Level: None Thought Processes: Unable to Assess Judgement: Impaired Orientation: Person;Place Obsessive Compulsive Thoughts/Behaviors: None  Cognitive Functioning Concentration: Decreased Memory: Recent Impaired;Remote Intact IQ: Average Insight: Fair Impulse Control: Unable to Assess Appetite: Good Weight Loss: 0 Weight Gain: 0 Sleep: No Change Total  Hours of Sleep: 8 Vegetative Symptoms: None  ADLScreening [facility] Assessment Services) Patient's cognitive ability adequate to safely complete daily activities?: Yes Patient able to express need for assistance with ADLs?: Yes Independently performs ADLs?: Yes (appropriate for developmental age)  Prior Inpatient Therapy Prior Inpatient Therapy: No Prior Therapy Dates: n/a Prior Therapy Facilty/Provider(s): n/a Reason for Treatment: n/a  Prior Outpatient Therapy Prior Outpatient Therapy: No Prior Therapy Dates: n/a Prior Therapy Facilty/Provider(s): n/a Reason for Treatment: n/a  ADL Screening (condition at time of admission) Patient's cognitive ability adequate to safely complete daily activities?: Yes Is the patient deaf or have difficulty hearing?: No Does the patient have difficulty seeing, even when wearing glasses/contacts?: No Does the patient have difficulty concentrating, remembering, or making decisions?: No Patient able to express need for assistance with ADLs?: Yes Does the patient have difficulty dressing or bathing?: No Independently performs ADLs?: Yes (appropriate for developmental age)       Abuse/Neglect Assessment (Assessment to be complete while patient is alone) Physical Abuse: Denies Verbal Abuse: Denies Sexual Abuse: Denies Exploitation of patient/patient's resources: Denies Self-Neglect: Denies Values / Beliefs Cultural Requests During Hospitalization: None Spiritual Requests During Hospitalization: None     Nutrition Screen- [facility] Patient's home diet:  (pt is diabetic)  Additional Information 1:1 In Past 12 Months?: No CIRT Risk: Yes Elopement Risk: Yes Does patient have medical clearance?: No     Disposition:  Per Dr. Roxanne Mins, EDP he has filed IVC paperwork, and is continuing medical clearance. Pt will need to be evaluated by psychiatry in the AM to uphold or rescind IVC.   Lear Ng, [facility] Triage Specialist 01/13/2014 4:08  AM  On Site Evaluation by:   Reviewed with Physician:    Rhona Raider 01/13/2014 3:48 AM

## 2014-01-13 NOTE — ED Notes (Signed)
TSS machine placed in Room 16.

## 2014-01-13 NOTE — ED Notes (Signed)
Consulted with EDP concerning restarting IV access that pt removed prior to shift change. EDP reported to wait for results of ABG and Chest XRAY. If results are negative then IV site does not need to be restarted per EDP.

## 2014-01-13 NOTE — ED Notes (Signed)
Pt's husband out at nurses' desk and states he can not take pt home because she is threatening to kill him when they get home; said nurse did hear pt state she wanted to get her gun when she was discharged. Dr. Roxanne Mins  Notified and new orders given.

## 2014-01-13 NOTE — ED Notes (Signed)
Put 3XL maroon paper pants on pt with she is pulling off, cannot keep pt covered up secondary to restlessness

## 2014-01-13 NOTE — ED Notes (Signed)
EDP reported that pt is to be admitted. [facility] called. No answer at this time. Will try again later.

## 2014-01-13 NOTE — ED Provider Notes (Signed)
8:09 AM Upon recheck, patient is difficult to rouse.  Patient reports her breathing has improved.  Patient is aware she is at [facility].  Patient reports she was brought to ED because her "husband was scared of her" but denies she threatened her husband with a gun or threatened to hit him. Patient states she became frustrated last night when she "couldn't say what I wanted to say".  She states this happened several weeks ago but resolved spontaneously and only lasted a few minutes.   She denies difficulty breathing currently.  Patient denies previous treatment for depression, previous admissions to psychiatric facility, previous and current suicidal ideation, previous and current homicidal ideation. She denies history of smoking or her husband smoking.      PE:  Difficult to wake. Audible wheezing at times.  Restless.  Trying to take off gown for no reason.    Pt may have had 2 TIA's per her description. She had a CT of her head last night showing ischemic changes in the right internal capsule.  Pt is pending TSS consult today with psychiatrist. Currently it is difficult to keep her awake and focused for interview. Will repeat her nebulizer and get ABG.   10:00 after patient's nebulizer treatment she had ABG done which showed she was hypoxic with O2 saturation of 89%. Her chest x-ray was unrevealing for etiology of her hypoxia. I am adding a d-dimer, troponin, and BMP. She was placed on nasal cannula oxygen.  Pt's BNP is significantly more elevated than earlier this year. Will give IV lasix.   14:32 Dr Roderic Palau, will come evaluate patient. She has possible exacerbation of her CHF to explain her hypoxia, ? TIA with expressive aphasia, unclear at this time if she needs medical or psychiatric admission. Has not had the psychiatric TSS evaluation today yet.   15:00 Dr Roderic Palau, is going to admit, has seen patient before and states this is a marked change in her mental status, tele, team 2   ABG     Component Value Date/Time   PHART 7.401 01/13/2014 0830   PCO2ART 42.2 01/13/2014 0830   PO2ART 58.4* 01/13/2014 0830   HCO3 25.7* 01/13/2014 0830   TCO2 22.9 01/13/2014 0830   O2SAT 89.3 01/13/2014 0830   Pt has hypoxia with pulse ox 89%  Ddimer 0.44 normal  Pro B Natriuretic peptide (BNP)  Date Value Ref Range Status  01/13/2014 4362.0* 0 - 125 pg/mL Final  06/10/2013 1779.0* 0 - 125 pg/mL Final  01/15/2012 454.6* 0 - 125 pg/mL Final  07/25/2011 586.6* 0 - 125 pg/mL Final   markder increase in her BNP  Troponin < 0.30  Dg Chest 2 View  01/13/2014   CLINICAL DATA:  Short of breath.  EXAM: CHEST  2 VIEW  COMPARISON:  06/10/2013  FINDINGS: Exam limited by low lung volumes and supine patient positioning as well as the patient's body habitus.  Cardiac silhouette is mildly enlarged. No mediastinal or hilar masses. Mildly prominent interstitial markings. No lung consolidation. No convincing edema.  Bony thorax is grossly intact.  IMPRESSION: Limited exam.  No acute cardiopulmonary disease.   Electronically Signed   By: Lajean Manes M.D.   On: 01/13/2014 09:13    Ct Head Wo Contrast  01/13/2014   CLINICAL DATA:  Headache.  Hyperglycemia.  Altered mental status. .  IMPRESSION: 1. No acute intracranial pathology seen on CT. 2. Mild small vessel ischemic microangiopathy, with mild chronic ischemic change at the anterior limb of the  right internal capsule.   Electronically Signed   By: Garald Balding M.D.   On: 01/13/2014 04:34    Mar 16, 2013 CLINICAL DATA: 68 year old female with confusion, memory loss, off  balance.  EXAM:  MRA HEAD WITHOUT CONTRAST  TECHNIQUE:  MRA HEAD WITHOUT CONTRAST  COMPARISON: Brain MRI 03/16/2013.  IMPRESSION:  1. Evidence of hemodynamically significant atherosclerotic stenosis  of the left ICA siphon at the anterior genu.  2. Otherwise mild anterior circulation atherosclerosis with no  additional stenosis and no major branch occlusion.  3. Negative posterior  circulation.  Electronically Signed  By: Lars Pinks M.D.  On: 03/16/2013 18:28    Date: 01/13/2014  Rate: 88  Rhythm: normal sinus rhythm  QRS Axis: normal  Intervals: normal  ST/T Wave abnormalities: normal  Conduction Disutrbances:none  Narrative Interpretation: baseline wander `  Old EKG Reviewed: none available   Diagnoses that have been ruled out:  None  Diagnoses that are still under consideration:  None  Final diagnoses:  Acute psychosis  Bronchospasm  Hyperglycemia  Delirium  Hypoxia       I personally performed the services described in this documentation, which was scribed in my presence. The recorded information has been reviewed and considered.  Rolland Porter, MD, Abram Sander   Janice Norrie, MD 01/13/14 [phone]

## 2014-01-13 NOTE — ED Notes (Signed)
Slightly less restless, still tossing and turning on bed.

## 2014-01-13 NOTE — ED Notes (Addendum)
Remains restless, audible expiratory wheezing note when talking. Right sided expiratory wheezing noted with auscultation

## 2014-01-13 NOTE — ED Notes (Signed)
Restlessness and agitation increasing. Security called to bedside and ERMD aware

## 2014-01-13 NOTE — ED Notes (Signed)
Pt alert and anxious. Attempted to EKG.pt not able to remain still or calm enough to complete EKG. EDP aware and reported to leave monitor leads on pt and attempt to get EKG when pt sleeping/resting.

## 2014-01-13 NOTE — Progress Notes (Signed)
01/13/14 1642 Environmental room search completed on patient arrival to unit. Sitter at bedside for safety. Patient alert and oriented to self and place on arrival to unit. On reassessment, pt asleep, arousable with sternal rub, opens eyes and goes back to sleep. Unable to complete stroke swallow evaluation while patient asleep. Unable to obtain complete neuro check while patient asleep. Notified Dr. Roderic Palau. CBG: 82 this afternoon. Received morning dose of insulin 70/30 at lunch time per ER nurse report. Evening dose held, see MAR. Notified Dr. Roderic Palau. Stated okay, nursing to monitor for hypoglycemia. lovenox d/c'd per MD order pending neuro consult. SCDs ordered. NPO at this time. Po medications not to be given until pt passes swallow screen. See MAR. Sitter at bedside. Donavan Foil, RN

## 2014-01-13 NOTE — ED Notes (Signed)
Pt's spouse came out to desk to state pt is getting dressed. Security called and security and [employer] at pt's door.

## 2014-01-13 NOTE — BH Assessment (Signed)
Discussed results of assessment with Dr. Roxanne Mins, EDP, and asked if IVC is being sought. Dr. Roxanne Mins is continuing to work on medical clearance and has sought IVC.   Spoke with Otila Kluver, RN who reports pt was overheard saying she wanted to get her gun, and has been having difficulty with verbal communication throughout her time in the ED.   Informed Dr. Roxanne Mins of the pt's struggles to communicate and disorganized speech pattern during assessment.   Pt will need to be evaluated by psychiatry in the morning to uphold or rescind IVC.  Lear Ng, [facility] Triage Specialist 01/13/2014 3:33 AM

## 2014-01-13 NOTE — ED Notes (Signed)
Spoke with Santiago Glad at [employer]. [facility] inquired about pt census information.

## 2014-01-13 NOTE — ED Notes (Addendum)
Remains excessively restless requiring 1:1 care. Pt has been placed in a regular hospital bed for comfort and safety reasons. mildly diaphoretic, expiratory wheezing. ERMD aware.

## 2014-01-13 NOTE — ED Notes (Signed)
Some of pt's 8AM and 10AM medication not available in override or in pt med holding bin. Pharmacy aware and reported would bring doses down.

## 2014-01-13 NOTE — ED Notes (Signed)
Patient placed in wine scrub pants at this time, patient pulling clothes up. Has torn pants off.

## 2014-01-13 NOTE — ED Notes (Signed)
Other morning meds not given at scheduled time due to not being stocked in unit pyxis at this time.

## 2014-01-13 NOTE — ED Notes (Signed)
"  I can't breath"

## 2014-01-13 NOTE — ED Notes (Signed)
Hospitalist at bedside 

## 2014-01-13 NOTE — ED Notes (Signed)
Received care, pt has been moved from rm4 to rm 16. Very restless, c/o "restless legs" agrees to ativan PO.Pt can converse if she is initiating the conversation ie: "when are they going to let me go home? I want to sleep in my own bed" but when asked a question, ie: what is today's date? Pt becomes very frustrated and says " I know this but I can't say it". Same response when asked her name. Then she coherently asked to go to the bathroom. Pt then assisted to bedside commode.

## 2014-01-13 NOTE — ED Notes (Signed)
Pt has telepsych appointment at approx 1300 per [facility]

## 2014-01-13 NOTE — ED Notes (Addendum)
Pt awake,alert and sitting upright in bed. Pt very anxious and intermittent expressive aphasia noted. Pt requesting to sit up on side of bed at time of rounding to help restless leg syndrome. Pt aided to side of bed. Pt anxiety improved, pt ate approximately 30% of breakfast. Auditory wheezing still heard. RT aware of order for duoneb.

## 2014-01-13 NOTE — Progress Notes (Signed)
01/13/14 1804 Patient remains sleepy, arousable with stimulation/repositioning. Grips equal once awakened, no drift noted to limbs. Pt goes back to sleep quickly. CBG: 79. Notified Dr. Roderic Palau. Orders received for 1/2 amp of D50, start IVF D5NS at 50 ml/hour. Check CBGs every 2 hours. Nursing to monitor. Donavan Foil, RN

## 2014-01-13 NOTE — ED Notes (Signed)
Conrad from [facility] reported that due to pt being admitted for delirium that the telepsych at this time would not be accurate. Heloise Purpura reported for receiving unit to call [facility] once delirium clears up.

## 2014-01-13 NOTE — ED Notes (Signed)
Medicated pt with geodon IM w/o incident.

## 2014-01-13 NOTE — H&P (Signed)
[facility] History and Physical  Mary [name] [id number] DOB: May 08, 1946 DOA: 01/12/2014  Referring physician: Dr. Eliane Decree PCP: Delphina Cahill, MD   Chief Complaint: altered mental status  HPI: Mary [name] is a 68 y.o. female with history of hypertension, diabetes, restless leg syndrome, chronic diastolic congestive heart failure. Patient is unable to provide any history at this time due to her mental status. History is obtained from the medical record, as well as discussing with her husband. Patient was apparently her usual state of health yesterday, when she developed significant headache. Her husband checked her blood sugar was noted to be significantly elevated. He proceeded to give her insulin, she continued to complain of headache. He checked her blood pressure was noted to be significantly elevated with systolic blood pressures greater than 200. Patient became somewhat confused intermittently brought to the emergency room. After evaluation in the emergency room, he was noted that a CT head was negative for acute process, urinalysis did not show any signs of infection. Chest x-ray demonstrated pneumonia. Chemistries are relatively unremarkable. Patient received several medications including Reglan, Benadryl and finally Toradol that improved her headache. Plans were to discharge the patient home, as she threatened to hurt her husband and upon returning home. There was concern for his safety and therefore the patient was detained in the emergency room. Plans were for psychiatric evaluation. Unfortunately the patient became increasingly agitated and confused. She repeatedly said that she was unable to say what she wanted to say. She received several doses of sedative medications including Ativan and Geodon. He then complained of significant shortness of breath. D-dimer was found to be negative. Cardiac enzymes also negative. Her BNP was noted to be elevated about prior levels. She  did receive a dose of IV Lasix as well as nebulizer treatments. She still remains delirious at this time which appears relatively acute onset. She'll be admitted to the hospital to rule out any underlying medical reason.   Review of Systems:  Unable to assess due to mental status  Past Medical History  Diagnosis Date  . CHF (congestive heart failure)   . Diabetes mellitus   . Restless leg syndrome 07/25/2011  . Wound, open     right foot  . Diabetes mellitus with neuropathy 07/25/2011  . Cellulitis and abscess of foot 02/12/2012  . Osteomyelitis of right foot 02/14/2012  . Partial Achilles tendon tear 02/14/2012  . Dysrhythmia     tachy- takes Metoprolol  . OSA (obstructive sleep apnea) 07/25/2011    not using CPAP  . Arthritis     knees  . Anemia   . Complication of anesthesia     pt states after her hysterectomy the doctor said she was "wild" when she woke up    Past Surgical History  Procedure Laterality Date  . Abdominal hysterectomy    . Achilles tendon repair    . Toe amputation      partial rt great toe  . Below knee leg amputation Right 11/25/2012    Dr Sharol Given  . Amputation Right 11/25/2012    Procedure: AMPUTATION BELOW KNEE;  Surgeon: Newt Minion, MD;  Location: [facility];  Service: Orthopedics;  Laterality: Right;  Right Below Knee Amputation  . Breast surgery Left     Lumpectomy non ca  . Cataract extraction w/phaco Right 10/09/2013    Procedure: CATARACT EXTRACTION PHACO AND INTRAOCULAR LENS PLACEMENT (IOC);  Surgeon: Elta Guadeloupe T. Gershon Crane, MD;  Location: [facility];  Service: Ophthalmology;  Laterality:  Right;  CDE:9.05  . Cataract extraction w/phaco Left 10/23/2013    Procedure: ATTEMPTED CATARACT EXTRACTION PHACO AND INTRAOCULAR LENS PLACEMENT;  Surgeon: Elta Guadeloupe T. Gershon Crane, MD;  Location: [facility];  Service: Ophthalmology;  Laterality: Left;  CDE:  4.41   Social History:  reports that she has never smoked. She has never used smokeless tobacco. She reports that she does not drink  alcohol or use illicit drugs.  No Known Allergies  Family History  Problem Relation Age of Onset  . Diabetes Sister      Prior to Admission medications   Medication Sig Start Date End Date Taking? Authorizing Provider  aspirin EC 81 MG tablet Take 1 tablet (81 mg total) by mouth daily. 03/17/13  Yes Kathie Dike, MD  cholecalciferol (VITAMIN D) 1000 UNITS tablet Take 1,000 Units by mouth daily.    Yes Historical Provider, MD  cilostazol (PLETAL) 100 MG tablet Take 100 mg by mouth 2 (two) times daily.  02/15/13  Yes Historical Provider, MD  cyclobenzaprine (FLEXERIL) 5 MG tablet Take 5 mg by mouth 2 (two) times daily as needed for muscle spasms.  06/20/13  Yes Historical Provider, MD  DULoxetine (CYMBALTA) 60 MG capsule Take 60 mg by mouth at bedtime as needed (nerve pain).  08/22/13  Yes Historical Provider, MD  gabapentin (NEURONTIN) 300 MG capsule Take 600 mg by mouth 3 (three) times daily. 03/28/13  Yes Historical Provider, MD  ibuprofen (ADVIL,MOTRIN) 400 MG tablet Take 400 mg by mouth every 6 (six) hours as needed for mild pain. 12/05/13  Yes Julianne Rice, MD  insulin lispro protamine-lispro (HUMALOG 50/50) (50-50) 100 UNIT/ML SUSP injection Inject 55-90 Units into the skin 3 (three) times daily with meals. 80-90 units in the morning, 80-90 units in the afternoon, and 55 units at bedtime. Based on sliding scale   Yes Historical Provider, MD  IRON PO Take 5 tablets by mouth daily.   Yes Historical Provider, MD  Magnesium 100 MG TABS Take 100 mg by mouth daily.   Yes Historical Provider, MD  metoprolol (LOPRESSOR) 50 MG tablet Take 50 mg by mouth 2 (two) times daily.   Yes Historical Provider, MD  Multiple Vitamin (MULTIVITAMIN WITH MINERALS) TABS Take 1 tablet by mouth once a week.    Yes Historical Provider, MD  OVER THE COUNTER MEDICATION Take 2 tablets by mouth every 6 (six) hours as needed (Restful Legs OTC Medication).   Yes Historical Provider, MD  pravastatin (PRAVACHOL) 40 MG  tablet Take 40 mg by mouth daily.   Yes Historical Provider, MD  rOPINIRole (REQUIP) 2 MG tablet Take 2 mg by mouth every evening.    Yes Historical Provider, MD  spironolactone (ALDACTONE) 25 MG tablet Take 25 mg by mouth daily. 02/15/13  Yes Historical Provider, MD  traMADol (ULTRAM) 50 MG tablet Take 1 tablet (50 mg total) by mouth every 6 (six) hours as needed. 12/05/13  Yes Julianne Rice, MD   Physical Exam: Filed Vitals:   01/13/14 0952 01/13/14 1430 01/13/14 1432 01/13/14 1500  BP: 139/84 143/66  157/76  Pulse: 114 79    Temp:      TempSrc:      Resp:   22   Height:      Weight:      SpO2:  98%      Wt Readings from Last 3 Encounters:  01/12/14 111.131 kg (245 lb)  12/05/13 131.09 kg (289 lb)  10/04/13 122.018 kg (269 lb)    General:  Patient falls  asleep during conversation, speech is incoherent, very restless Eyes: PERRL, normal lids, irises & conjunctiva ENT: grossly normal hearing, lips & tongue Neck: no LAD, masses or thyromegaly Cardiovascular: RRR, no m/r/g. No LE edema. Respiratory: CTA bilaterally, no w/r/r. Normal respiratory effort. Abdomen: soft, obese, nt, bs+ Skin: no rash or induration seen on limited exam Musculoskeletal: grossly normal tone BUE/BLE Psychiatric: appears delirious Neurologic: moving all extremities spontaneously, pupils are equal and react bilaterally, no facial asymmetry.  Difficult exam since patient cannot cooperate          Labs on Admission:  Basic Metabolic Panel:  Recent Labs Lab 01/12/14 2359  NA 134*  K 4.6  CL 97  CO2 25  GLUCOSE 415*  BUN 28*  CREATININE 1.01  CALCIUM 8.3*   Liver Function Tests: No results found for this basename: AST, ALT, ALKPHOS, BILITOT, PROT, ALBUMIN,  in the last 168 hours No results found for this basename: LIPASE, AMYLASE,  in the last 168 hours No results found for this basename: AMMONIA,  in the last 168 hours CBC:  Recent Labs Lab 01/12/14 2359  WBC 10.4  NEUTROABS 8.2*  HGB  11.2*  HCT 33.5*  MCV 84.0  PLT 269   Cardiac Enzymes:  Recent Labs Lab 01/13/14 1010  TROPONINI <0.30    BNP (last 3 results)  Recent Labs  06/10/13 0630 01/13/14 1010  PROBNP 1779.0* 4362.0*   CBG:  Recent Labs Lab 01/13/14 0205 01/13/14 0415 01/13/14 0722 01/13/14 1301  GLUCAP 254* 226* 172* 147*    Radiological Exams on Admission: Dg Chest 2 View  01/13/2014   CLINICAL DATA:  Short of breath.  EXAM: CHEST  2 VIEW  COMPARISON:  06/10/2013  FINDINGS: Exam limited by low lung volumes and supine patient positioning as well as the patient's body habitus.  Cardiac silhouette is mildly enlarged. No mediastinal or hilar masses. Mildly prominent interstitial markings. No lung consolidation. No convincing edema.  Bony thorax is grossly intact.  IMPRESSION: Limited exam.  No acute cardiopulmonary disease.   Electronically Signed   By: Lajean Manes M.D.   On: 01/13/2014 09:13   Ct Head Wo Contrast  01/13/2014   CLINICAL DATA:  Headache.  Hyperglycemia.  Altered mental status.  EXAM: CT HEAD WITHOUT CONTRAST  TECHNIQUE: Contiguous axial images were obtained from the base of the skull through the vertex without intravenous contrast.  COMPARISON:  CT of the head performed 12/05/2013  FINDINGS: There is no evidence of acute infarction, mass lesion, or intra- or extra-axial hemorrhage on CT. Evaluation is suboptimal due to motion artifact.  Mild periventricular white matter change may reflect small vessel ischemic microangiopathy. Mild chronic ischemic change is noted at the anterior limb of the right internal capsule, unchanged from prior studies.  The posterior fossa, including the cerebellum, brainstem and fourth ventricle, is within normal limits. The third and lateral ventricles are unremarkable in appearance. The cerebral hemispheres are symmetric in appearance, with normal gray-white differentiation. No mass effect or midline shift is seen.  There is no evidence of fracture;  visualized osseous structures are unremarkable in appearance. The visualized portions of the orbits are within normal limits. The paranasal sinuses and mastoid air cells are well-aerated. No significant soft tissue abnormalities are seen.  IMPRESSION: 1. No acute intracranial pathology seen on CT. 2. Mild small vessel ischemic microangiopathy, with mild chronic ischemic change at the anterior limb of the right internal capsule.   Electronically Signed   By: Francoise Schaumann.D.  On: 01/13/2014 04:34    Assessment/Plan Principal Problem:   Acute encephalopathy Active Problems:   Diabetes mellitus with neuropathy   Acute respiratory failure   Restless leg syndrome   Hypertension   Hx of right BKA   Delirium   Diastolic CHF, acute on chronic   Expressive aphasia   1. Acute delirium. Etiology is not entirely clear at this point. She was noted to be hypoxic which could be playing a role. She has not been started on any new medications per her husband. She does not have any fevers or leukocytosis. Question if this is related to a hypertensive encephalopathy since she reported that she had significantly high blood pressures prior to admission. She does not have any reported history of mental health issues. Once her mental status to stabilize, then she will need reevaluation by psychiatry. In the meantime, she'll be admitted to a telemetry bed. We will use benzodiazepines when necessary. We will check ammonia, TSH, provide oxygen for hypoxia. She will also undergo further imaging with MRI brain. We'll request neurology consultation since she is known to Dr. Merlene Laughter and recently saw him on 8/14 for a "spinal injection" per husband. She'll be placed on sitter precautions for her safety. 2. Possible expressive aphasia. Very TIA. Patient does have a history of cerebrovascular disease. We'll continue her on aspirin. We'll try and obtain MRI of the brain as her clinical condition will allow. It is unclear if  this is a true expressive aphasia or this is just a manifestation of her delirium. Neurology has been consulted 3. Acute on chronic diastolic congestive heart failure. Patient did receive significant IV fluids in the emergency room. Her BNP is noted to be above baseline. We will start her on intravenous Lasix and monitor her volume status. 4. Diabetes. Continue home regimen of NPH. This will need to be adjusted if she is unable to take any food by mouth. Sliding scale insulin. 5. Restless leg syndrome. Continue Requip.  Code Status: DNR DVT Prophylaxis: SCD Family Communication: discussed with husband over the phone Disposition Plan: discharge home once improved  Time spent: 65mins  Candelaria Pies [facility] Pager [phone]  **Disclaimer: This note may have been dictated with voice recognition software. Similar sounding words can inadvertently be transcribed and this note may contain transcription errors which may not have been corrected upon publication of note.**

## 2014-01-13 NOTE — ED Notes (Signed)
IVC paperwork and holding meds sent to floor with pt.

## 2014-01-14 ENCOUNTER — Inpatient Hospital Stay (HOSPITAL_COMMUNITY): Payer: Medicare HMO

## 2014-01-14 DIAGNOSIS — G2581 Restless legs syndrome: Secondary | ICD-10-CM

## 2014-01-14 DIAGNOSIS — R7309 Other abnormal glucose: Secondary | ICD-10-CM

## 2014-01-14 DIAGNOSIS — R404 Transient alteration of awareness: Secondary | ICD-10-CM

## 2014-01-14 DIAGNOSIS — I059 Rheumatic mitral valve disease, unspecified: Secondary | ICD-10-CM

## 2014-01-14 LAB — BASIC METABOLIC PANEL
ANION GAP: 15 (ref 5–15)
BUN: 29 mg/dL — ABNORMAL HIGH (ref 6–23)
CALCIUM: 8.2 mg/dL — AB (ref 8.4–10.5)
CO2: 28 mEq/L (ref 19–32)
Chloride: 100 mEq/L (ref 96–112)
Creatinine, Ser: 1.17 mg/dL — ABNORMAL HIGH (ref 0.50–1.10)
GFR calc Af Amer: 54 mL/min — ABNORMAL LOW (ref >90)
GFR, EST NON AFRICAN AMERICAN: 47 mL/min — AB (ref >90)
Glucose, Bld: 118 mg/dL — ABNORMAL HIGH (ref 70–99)
POTASSIUM: 3.9 meq/L (ref 3.7–5.3)
Sodium: 143 mEq/L (ref 137–147)

## 2014-01-14 LAB — GLUCOSE, CAPILLARY
GLUCOSE-CAPILLARY: 109 mg/dL — AB (ref 70–99)
GLUCOSE-CAPILLARY: 31 mg/dL — AB (ref 70–99)
GLUCOSE-CAPILLARY: 404 mg/dL — AB (ref 70–99)
Glucose-Capillary: 109 mg/dL — ABNORMAL HIGH (ref 70–99)
Glucose-Capillary: 61 mg/dL — ABNORMAL LOW (ref 70–99)
Glucose-Capillary: 72 mg/dL (ref 70–99)
Glucose-Capillary: 113 mg/dL — ABNORMAL HIGH (ref 70–99)
Glucose-Capillary: 93 mg/dL (ref 70–99)
Glucose-Capillary: 122 mg/dL — ABNORMAL HIGH (ref 70–99)
Glucose-Capillary: 123 mg/dL — ABNORMAL HIGH (ref 70–99)
Glucose-Capillary: 140 mg/dL — ABNORMAL HIGH (ref 70–99)
Glucose-Capillary: 112 mg/dL — ABNORMAL HIGH (ref 70–99)
Glucose-Capillary: 185 mg/dL — ABNORMAL HIGH (ref 70–99)
Glucose-Capillary: 218 mg/dL — ABNORMAL HIGH (ref 70–99)
Glucose-Capillary: 265 mg/dL — ABNORMAL HIGH (ref 70–99)
Glucose-Capillary: 246 mg/dL — ABNORMAL HIGH (ref 70–99)
Glucose-Capillary: 152 mg/dL — ABNORMAL HIGH (ref 70–99)

## 2014-01-14 LAB — LIPID PANEL
Cholesterol: 149 mg/dL (ref 0–200)
HDL: 50 mg/dL (ref >39)
LDL CALC: 69 mg/dL (ref 0–99)
Total CHOL/HDL Ratio: 3 RATIO
Triglycerides: 148 mg/dL (ref <150)
VLDL: 30 mg/dL (ref 0–40)

## 2014-01-14 MED ORDER — INSULIN ASPART PROT & ASPART (70-30 MIX) 100 UNIT/ML ~~LOC~~ SUSP
40.0000 [IU] | Freq: Every day | SUBCUTANEOUS | Status: DC
  Administered 2014-01-14 – 2014-01-15 (×3): 40 [IU] via SUBCUTANEOUS

## 2014-01-14 MED ORDER — GLUCOSE 40 % PO GEL
ORAL | Status: AC
  Filled 2014-01-14: qty 1

## 2014-01-14 MED ORDER — INSULIN ASPART PROT & ASPART (70-30 MIX) 100 UNIT/ML ~~LOC~~ SUSP: 60.0000 [IU] | Freq: Two times a day (BID) | SUBCUTANEOUS | Status: DC

## 2014-01-14 MED ORDER — FUROSEMIDE 20 MG PO TABS
20.0000 mg | ORAL_TABLET | Freq: Every day | ORAL | Status: DC
  Administered 2014-01-14 – 2014-01-15 (×2): 20 mg via ORAL
  Filled 2014-01-14 (×2): qty 1

## 2014-01-14 MED ORDER — GLUCOSE 40 % PO GEL
1.0000 | ORAL | Status: DC | PRN
  Administered 2014-01-14: 37.5 g via ORAL

## 2014-01-14 MED ORDER — DEXTROSE 50 % IV SOLN
25.0000 mL | Freq: Once | INTRAVENOUS | Status: AC | PRN
  Administered 2014-01-14: 25 mL via INTRAVENOUS

## 2014-01-14 NOTE — Progress Notes (Signed)
Inpatient Diabetes Program Recommendations  AACE/ADA: New Consensus Statement on Inpatient Glycemic Control (2013)  Target Ranges:  Prepandial:   less than 140 mg/dL      Peak postprandial:   less than 180 mg/dL (1-2 hours)      Critically ill patients:  140 - 180 mg/dL   Results for Mary [name], Mary [name] (MRN [id number]) as of 01/15/2014 08:20  Ref. Range 01/14/2014 09:56 01/14/2014 11:41 01/14/2014 14:27 01/14/2014 16:07 01/14/2014 18:25 01/14/2014 20:01 01/14/2014 21:57 01/15/2014 00:29 01/15/2014 02:32 01/15/2014 04:31 01/15/2014 06:23  Glucose-Capillary Latest Range: 70-99 mg/dL 112 (H) 109 (H) 185 (H) 218 (H) 265 (H) 246 (H) 152 (H) 144 (H) 132 (H) 77 92    Diabetes history: DM2  Outpatient Diabetes medications: Humalog 50/50 80-90 units QAM, Humalog 50/50 80-90 units QPM, Humalog 50/50 55 units QHS  Current orders for Inpatient glycemic control: 70/30 60 units with breakfast, 70/30 60 units with lunch, 70/30 40 units with supper, Novolog 0-15 units Q4H  Inpatient Diabetes Program Recommendations Insulin - Basal: Noted the only 70/30 patient received on 8/17 was 70/30 40 units at 18:39 pm. Glucose this morning at 4:31 am was noted to be 77 mg/dl. May want to consider lowering 70/30 dosages with meals to prevent hypoglycemia as an inpatient.  HgbA1C: A1C 10.9% on 01/13/14 indicating poor glycemic control over the past 2-3 months.   Thanks, Barnie Alderman, RN, MSN, CCRN Diabetes Coordinator Inpatient Diabetes Program [phone] (Team Pager) [phone] ([facility] office) [phone] [facility] office)

## 2014-01-14 NOTE — Progress Notes (Signed)
UR chart review completed.  

## 2014-01-14 NOTE — Progress Notes (Signed)
BP 100/68 HR 83. Notified Dr Baltazar Najjar regarding BP & asked whether or not HS dose BP med(metoprolol 50 mg) should be given or held. Waiting for physician to contact nurse for further orders. Will continue to monitor pt throughout night. Pt resting comfortably in bed at lowest position & call bell within reach.

## 2014-01-14 NOTE — Consult Note (Signed)
[place] A. Merlene Laughter, MD     [email or web address]          Mary [name] is an 68 y.o. female.   ASSESSMENT/PLAN: 1. Acute confusional state most likely due to steroid psychosis compounded with the multiple psychotropic medications used to treat headaches and insomnia. The confusion was likely compounded by the patient taking over-the-counter medications for insomnia. Sedatives used to treat headaches also is a confounding issue. No further workup is suggested at this time as she has improved significantly.  2. Chronic radicular low back pain. Status post caudal epidural injection.  3. Headaches possibly related to medication use to treat insomnia.  The patient is 68 year old White female who presents with headaches The day after she underwent a caudal epidural injection in the office. She has had these injections previously and did well without any untoward side effects or issues. She did tell me that she took an over-the-counter medications and possibly valrean root for insomnia. She had taken this in the past episodically without any issues. The patient was treated in the emergency room for headaches with various medications. However, she became agitated after she was treated. She became quite belligerent and confrontation with her husband. Patient was admitted for further evaluation. Other workup has been unrevealing. She reports being amnestic to the events over the past couple days. She has improved significantly and appears to be close to baseline. She does not report having any focal neurological deficit. Headaches have resolved. She reports responding well to the epidural injections with significant improvement of back and leg pain. No fevers are reported. Review of systems otherwise negative.  GENERAL: She is in no acute distress.  HEENT: Supple. Atraumatic normocephalic.   ABDOMEN: soft  EXTREMITIES: No edema. Status post Right BKA.   BACK:  Normal.  SKIN: Normal by inspection.    MENTAL STATUS: Alert and oriented. Speech, language and cognition are generally intact. Judgment and insight normal.   CRANIAL NERVES: Pupils are equal, round and reactive to light and accommodation; extra ocular movements are full, there is no significant nystagmus; visual fields are full; upper and lower facial muscles are normal in strength and symmetric, there is no flattening of the nasolabial folds; tongue is midline; uvula is midline; shoulder elevation is normal.  MOTOR: Normal tone, bulk and strength; no pronator drift.  COORDINATION: Left finger to nose is normal, right finger to nose is normal, No rest tremor; no intention tremor; no postural tremor; no bradykinesia.  REFLEXES: Deep tendon reflexes are symmetrical and normal - But absent at the knees.    SENSATION: Normal to light touch.        Past Medical History  Diagnosis Date  . CHF (congestive heart failure)   . Diabetes mellitus   . Restless leg syndrome 07/25/2011  . Wound, open     right foot  . Diabetes mellitus with neuropathy 07/25/2011  . Cellulitis and abscess of foot 02/12/2012  . Osteomyelitis of right foot 02/14/2012  . Partial Achilles tendon tear 02/14/2012  . Dysrhythmia     tachy- takes Metoprolol  . OSA (obstructive sleep apnea) 07/25/2011    not using CPAP  . Arthritis     knees  . Anemia   . Complication of anesthesia     pt states after her hysterectomy the doctor said she was "wild" when she woke up     Past Surgical History  Procedure Laterality Date  . Abdominal hysterectomy    . Achilles tendon repair    .  Toe amputation      partial rt great toe  . Below knee leg amputation Right 11/25/2012    Dr Sharol Given  . Amputation Right 11/25/2012    Procedure: AMPUTATION BELOW KNEE;  Surgeon: Newt Minion, MD;  Location: [facility];  Service: Orthopedics;  Laterality: Right;  Right Below Knee Amputation  . Breast surgery Left     Lumpectomy non ca  .  Cataract extraction w/phaco Right 10/09/2013    Procedure: CATARACT EXTRACTION PHACO AND INTRAOCULAR LENS PLACEMENT (IOC);  Surgeon: Elta Guadeloupe T. Gershon Crane, MD;  Location: [facility];  Service: Ophthalmology;  Laterality: Right;  CDE:9.05  . Cataract extraction w/phaco Left 10/23/2013    Procedure: ATTEMPTED CATARACT EXTRACTION PHACO AND INTRAOCULAR LENS PLACEMENT;  Surgeon: Elta Guadeloupe T. Gershon Crane, MD;  Location: [facility];  Service: Ophthalmology;  Laterality: Left;  CDE:  4.41    Family History  Problem Relation Age of Onset  . Diabetes Sister     Social History:  reports that she has never smoked. She has never used smokeless tobacco. She reports that she does not drink alcohol or use illicit drugs.  Allergies: No Known Allergies  Medications: Prior to Admission medications   Medication Sig Start Date End Date Taking? Authorizing Provider  aspirin EC 81 MG tablet Take 1 tablet (81 mg total) by mouth daily. 03/17/13  Yes Kathie Dike, MD  cholecalciferol (VITAMIN D) 1000 UNITS tablet Take 1,000 Units by mouth daily.    Yes Historical Provider, MD  cilostazol (PLETAL) 100 MG tablet Take 100 mg by mouth 2 (two) times daily.  02/15/13  Yes Historical Provider, MD  cyclobenzaprine (FLEXERIL) 5 MG tablet Take 5 mg by mouth 2 (two) times daily as needed for muscle spasms.  06/20/13  Yes Historical Provider, MD  DULoxetine (CYMBALTA) 60 MG capsule Take 60 mg by mouth at bedtime as needed (nerve pain).  08/22/13  Yes Historical Provider, MD  gabapentin (NEURONTIN) 300 MG capsule Take 600 mg by mouth 3 (three) times daily. 03/28/13  Yes Historical Provider, MD  ibuprofen (ADVIL,MOTRIN) 400 MG tablet Take 400 mg by mouth every 6 (six) hours as needed for mild pain. 12/05/13  Yes Julianne Rice, MD  insulin lispro protamine-lispro (HUMALOG 50/50) (50-50) 100 UNIT/ML SUSP injection Inject 55-90 Units into the skin 3 (three) times daily with meals. 80-90 units in the morning, 80-90 units in the afternoon, and 55 units at  bedtime. Based on sliding scale   Yes Historical Provider, MD  IRON PO Take 5 tablets by mouth daily.   Yes Historical Provider, MD  Magnesium 100 MG TABS Take 100 mg by mouth daily.   Yes Historical Provider, MD  metoprolol (LOPRESSOR) 50 MG tablet Take 50 mg by mouth 2 (two) times daily.   Yes Historical Provider, MD  Multiple Vitamin (MULTIVITAMIN WITH MINERALS) TABS Take 1 tablet by mouth once a week.    Yes Historical Provider, MD  OVER THE COUNTER MEDICATION Take 2 tablets by mouth every 6 (six) hours as needed (Restful Legs OTC Medication).   Yes Historical Provider, MD  pravastatin (PRAVACHOL) 40 MG tablet Take 40 mg by mouth daily.   Yes Historical Provider, MD  rOPINIRole (REQUIP) 2 MG tablet Take 2 mg by mouth every evening.    Yes Historical Provider, MD  spironolactone (ALDACTONE) 25 MG tablet Take 25 mg by mouth daily. 02/15/13  Yes Historical Provider, MD  traMADol (ULTRAM) 50 MG tablet Take 1 tablet (50 mg total) by mouth every 6 (six) hours as  needed. 12/05/13  Yes Julianne Rice, MD    Scheduled Meds: . aspirin  325 mg Oral Daily  . cholecalciferol  1,000 Units Oral Daily  . cilostazol  100 mg Oral BID  . furosemide  20 mg Oral Daily  . gabapentin  600 mg Oral TID  . insulin aspart  0-15 Units Subcutaneous Q4H  . insulin aspart protamine- aspart  40 Units Subcutaneous Q supper  . [START ON 01/15/2014] insulin aspart protamine- aspart  60 Units Subcutaneous BID WC  . metoprolol  50 mg Oral BID  . rOPINIRole  2 mg Oral QPM  . simvastatin  20 mg Oral q1800  . spironolactone  25 mg Oral Daily   Continuous Infusions: . dextrose 5 % and 0.9% NaCl 50 mL/hr at 01/13/14 1823   PRN Meds:.acetaminophen, albuterol, dextrose, LORazepam   Blood pressure 120/46, pulse 80, temperature 98.3 F (36.8 C), temperature source Oral, resp. rate 18, height $RemoveBe'5\' 10"'gaIsGazIS$  (1.778 m), weight 129.729 kg (286 lb), last menstrual period 06/01/1983, SpO2 100.00%.   Results for orders placed during the  hospital encounter of 01/12/14 (from the past 48 hour(s))  GLUCOSE, CAPILLARY     Status: Abnormal   Collection Time    01/12/14 11:30 PM      Result Value Ref Range   Glucose-Capillary 404 (*) 70 - 99 mg/dL  CBC WITH DIFFERENTIAL     Status: Abnormal   Collection Time    01/12/14 11:59 PM      Result Value Ref Range   WBC 10.4  4.0 - 10.5 K/uL   RBC 3.99  3.87 - 5.11 MIL/uL   Hemoglobin 11.2 (*) 12.0 - 15.0 g/dL   HCT 33.5 (*) 36.0 - 46.0 %   MCV 84.0  78.0 - 100.0 fL   MCH 28.1  26.0 - 34.0 pg   MCHC 33.4  30.0 - 36.0 g/dL   RDW 14.1  11.5 - 15.5 %   Platelets 269  150 - 400 K/uL   Neutrophils Relative % 79 (*) 43 - 77 %   Neutro Abs 8.2 (*) 1.7 - 7.7 K/uL   Lymphocytes Relative 11 (*) 12 - 46 %   Lymphs Abs 1.1  0.7 - 4.0 K/uL   Monocytes Relative 10  3 - 12 %   Monocytes Absolute 1.0  0.1 - 1.0 K/uL   Eosinophils Relative 0  0 - 5 %   Eosinophils Absolute 0.0  0.0 - 0.7 K/uL   Basophils Relative 0  0 - 1 %   Basophils Absolute 0.0  0.0 - 0.1 K/uL  BASIC METABOLIC PANEL     Status: Abnormal   Collection Time    01/12/14 11:59 PM      Result Value Ref Range   Sodium 134 (*) 137 - 147 mEq/L   Potassium 4.6  3.7 - 5.3 mEq/L   Chloride 97  96 - 112 mEq/L   CO2 25  19 - 32 mEq/L   Glucose, Bld 415 (*) 70 - 99 mg/dL   BUN 28 (*) 6 - 23 mg/dL   Creatinine, Ser 1.01  0.50 - 1.10 mg/dL   Calcium 8.3 (*) 8.4 - 10.5 mg/dL   GFR calc non Af Amer 56 (*) >90 mL/min   GFR calc Af Amer 65 (*) >90 mL/min   Comment: (NOTE)     The eGFR has been calculated using the CKD EPI equation.     This calculation has not been validated in all clinical situations.  eGFR's persistently <90 mL/min signify possible Chronic Kidney     Disease.   Anion gap 12  5 - 15  ETHANOL     Status: None   Collection Time    01/12/14 11:59 PM      Result Value Ref Range   Alcohol, Ethyl (B) <11  0 - 11 mg/dL   Comment:            LOWEST DETECTABLE LIMIT FOR     SERUM ALCOHOL IS 11 mg/dL     FOR  MEDICAL PURPOSES ONLY  CBG MONITORING, ED     Status: Abnormal   Collection Time    01/13/14  2:05 AM      Result Value Ref Range   Glucose-Capillary 254 (*) 70 - 99 mg/dL  URINE RAPID DRUG SCREEN (HOSP PERFORMED)     Status: None   Collection Time    01/13/14  2:32 AM      Result Value Ref Range   Opiates NONE DETECTED  NONE DETECTED   Cocaine NONE DETECTED  NONE DETECTED   Benzodiazepines NONE DETECTED  NONE DETECTED   Amphetamines NONE DETECTED  NONE DETECTED   Tetrahydrocannabinol NONE DETECTED  NONE DETECTED   Barbiturates NONE DETECTED  NONE DETECTED   Comment:            DRUG SCREEN FOR MEDICAL PURPOSES     ONLY.  IF CONFIRMATION IS NEEDED     FOR ANY PURPOSE, NOTIFY LAB     WITHIN 5 DAYS.                LOWEST DETECTABLE LIMITS     FOR URINE DRUG SCREEN     Drug Class       Cutoff (ng/mL)     Amphetamine      1000     Barbiturate      200     Benzodiazepine   448     Tricyclics       185     Opiates          300     Cocaine          300     THC              50  URINALYSIS, ROUTINE W REFLEX MICROSCOPIC     Status: Abnormal   Collection Time    01/13/14  2:32 AM      Result Value Ref Range   Color, Urine YELLOW  YELLOW   APPearance CLEAR  CLEAR   Specific Gravity, Urine 1.010  1.005 - 1.030   pH 7.0  5.0 - 8.0   Glucose, UA >1000 (*) NEGATIVE mg/dL   Hgb urine dipstick MODERATE (*) NEGATIVE   Bilirubin Urine NEGATIVE  NEGATIVE   Ketones, ur NEGATIVE  NEGATIVE mg/dL   Protein, ur 30 (*) NEGATIVE mg/dL   Urobilinogen, UA 0.2  0.0 - 1.0 mg/dL   Nitrite NEGATIVE  NEGATIVE   Leukocytes, UA NEGATIVE  NEGATIVE  URINE MICROSCOPIC-ADD ON     Status: None   Collection Time    01/13/14  2:32 AM      Result Value Ref Range   Squamous Epithelial / LPF RARE  RARE   RBC / HPF 7-10  <3 RBC/hpf  CBG MONITORING, ED     Status: Abnormal   Collection Time    01/13/14  4:15 AM      Result Value Ref Range   Glucose-Capillary 226 (*)  70 - 99 mg/dL   Comment 1 Documented in  Chart     Comment 2 Notify RN    CBG MONITORING, ED     Status: Abnormal   Collection Time    01/13/14  7:22 AM      Result Value Ref Range   Glucose-Capillary 172 (*) 70 - 99 mg/dL  BLOOD GAS, ARTERIAL     Status: Abnormal   Collection Time    01/13/14  8:30 AM      Result Value Ref Range   FIO2 0.21     O2 Content 21.0     Delivery systems ROOM AIR     pH, Arterial 7.401  7.350 - 7.450   pCO2 arterial 42.2  35.0 - 45.0 mmHg   pO2, Arterial 58.4 (*) 80.0 - 100.0 mmHg   Bicarbonate 25.7 (*) 20.0 - 24.0 mEq/L   TCO2 22.9  0 - 100 mmol/L   Acid-Base Excess 1.4  0.0 - 2.0 mmol/L   O2 Saturation 89.3     Patient temperature 37.0     Collection site LEFT RADIAL     Drawn by [id number]     Sample type ARTERIAL     Allens test (pass/fail) PASS  PASS  D-DIMER, QUANTITATIVE     Status: None   Collection Time    01/13/14 10:10 AM      Result Value Ref Range   D-Dimer, Quant 0.44  0.00 - 0.48 ug/mL-FEU   Comment:            AT THE INHOUSE ESTABLISHED CUTOFF     VALUE OF 0.48 ug/mL FEU,     THIS ASSAY HAS BEEN DOCUMENTED     IN THE LITERATURE TO HAVE     A SENSITIVITY AND NEGATIVE     PREDICTIVE VALUE OF AT LEAST     98 TO 99%.  THE TEST RESULT     SHOULD BE CORRELATED WITH     AN ASSESSMENT OF THE CLINICAL     PROBABILITY OF DVT / VTE.  PRO B NATRIURETIC PEPTIDE     Status: Abnormal   Collection Time    01/13/14 10:10 AM      Result Value Ref Range   Pro B Natriuretic peptide (BNP) 4362.0 (*) 0 - 125 pg/mL  TROPONIN I     Status: None   Collection Time    01/13/14 10:10 AM      Result Value Ref Range   Troponin I <0.30  <0.30 ng/mL   Comment:            Due to the release kinetics of cTnI,     a negative result within the first hours     of the onset of symptoms does not rule out     myocardial infarction with certainty.     If myocardial infarction is still suspected,     repeat the test at appropriate intervals.  CBG MONITORING, ED     Status: Abnormal   Collection Time     01/13/14  1:01 PM      Result Value Ref Range   Glucose-Capillary 147 (*) 70 - 99 mg/dL  CBG MONITORING, ED     Status: None   Collection Time    01/13/14  3:44 PM      Result Value Ref Range   Glucose-Capillary 96  70 - 99 mg/dL  GLUCOSE, CAPILLARY     Status: None   Collection Time    01/13/14  4:29 PM      Result Value Ref Range   Glucose-Capillary 82  70 - 99 mg/dL   Comment 1 Notify RN     Comment 2 Documented in Chart    HEMOGLOBIN A1C     Status: Abnormal   Collection Time    01/13/14  4:40 PM      Result Value Ref Range   Hemoglobin A1C 10.9 (*) <5.7 %   Comment: (NOTE)                                                                               According to the ADA Clinical Practice Recommendations for 2011, when     HbA1c is used as a screening test:      >=6.5%   Diagnostic of Diabetes Mellitus               (if abnormal result is confirmed)     5.7-6.4%   Increased risk of developing Diabetes Mellitus     References:Diagnosis and Classification of Diabetes Mellitus,Diabetes     ZOXW,9604,54(UJWJX 1):S62-S69 and Standards of Medical Care in             Diabetes - 2011,Diabetes Care,2011,34 (Suppl 1):S11-S61.   Mean Plasma Glucose 266 (*) <117 mg/dL   Comment: Performed at [facility]  TSH     Status: None   Collection Time    01/13/14  4:40 PM      Result Value Ref Range   TSH 2.660  0.350 - 4.500 uIU/mL   Comment: Performed at [facility]     Status: None   Collection Time    01/13/14  4:40 PM      Result Value Ref Range   Ammonia 38  11 - 60 umol/L  GLUCOSE, CAPILLARY     Status: None   Collection Time    01/13/14  5:57 PM      Result Value Ref Range   Glucose-Capillary 79  70 - 99 mg/dL   Comment 1 Notify RN     Comment 2 Documented in Chart    GLUCOSE, CAPILLARY     Status: Abnormal   Collection Time    01/13/14  8:17 PM      Result Value Ref Range   Glucose-Capillary 103 (*) 70 - 99 mg/dL   Comment 1 Notify RN      Comment 2 Documented in Chart    GLUCOSE, CAPILLARY     Status: Abnormal   Collection Time    01/13/14 10:20 PM      Result Value Ref Range   Glucose-Capillary 172 (*) 70 - 99 mg/dL   Comment 1 Notify RN     Comment 2 Documented in Chart    GLUCOSE, CAPILLARY     Status: Abnormal   Collection Time    01/14/14 12:11 AM      Result Value Ref Range   Glucose-Capillary 109 (*) 70 - 99 mg/dL  GLUCOSE, CAPILLARY     Status: Abnormal   Collection Time    01/14/14  1:32 AM      Result Value Ref Range   Glucose-Capillary 61 (*) 70 - 99 mg/dL  Comment 1 Notify RN     Comment 2 Documented in Chart    GLUCOSE, CAPILLARY     Status: None   Collection Time    01/14/14  2:12 AM      Result Value Ref Range   Glucose-Capillary 72  70 - 99 mg/dL  GLUCOSE, CAPILLARY     Status: Abnormal   Collection Time    01/14/14  3:42 AM      Result Value Ref Range   Glucose-Capillary 34 (*) 70 - 99 mg/dL   Comment 1 Notify RN     Comment 2 Documented in Chart     Comment 3 Repeat Test    GLUCOSE, CAPILLARY     Status: Abnormal   Collection Time    01/14/14  3:43 AM      Result Value Ref Range   Glucose-Capillary 31 (*) 70 - 99 mg/dL   Comment 1 Notify RN     Comment 2 Documented in Chart    GLUCOSE, CAPILLARY     Status: None   Collection Time    01/14/14  4:07 AM      Result Value Ref Range   Glucose-Capillary 93  70 - 99 mg/dL  GLUCOSE, CAPILLARY     Status: Abnormal   Collection Time    01/14/14  4:30 AM      Result Value Ref Range   Glucose-Capillary 113 (*) 70 - 99 mg/dL  GLUCOSE, CAPILLARY     Status: Abnormal   Collection Time    01/14/14  5:34 AM      Result Value Ref Range   Glucose-Capillary 122 (*) 70 - 99 mg/dL  BASIC METABOLIC PANEL     Status: Abnormal   Collection Time    01/14/14  5:48 AM      Result Value Ref Range   Sodium 143  137 - 147 mEq/L   Comment: DELTA CHECK NOTED   Potassium 3.9  3.7 - 5.3 mEq/L   Chloride 100  96 - 112 mEq/L   CO2 28  19 - 32 mEq/L    Glucose, Bld 118 (*) 70 - 99 mg/dL   BUN 29 (*) 6 - 23 mg/dL   Creatinine, Ser 1.17 (*) 0.50 - 1.10 mg/dL   Calcium 8.2 (*) 8.4 - 10.5 mg/dL   GFR calc non Af Amer 47 (*) >90 mL/min   GFR calc Af Amer 54 (*) >90 mL/min   Comment: (NOTE)     The eGFR has been calculated using the CKD EPI equation.     This calculation has not been validated in all clinical situations.     eGFR's persistently <90 mL/min signify possible Chronic Kidney     Disease.   Anion gap 15  5 - 15  LIPID PANEL     Status: None   Collection Time    01/14/14  5:48 AM      Result Value Ref Range   Cholesterol 149  0 - 200 mg/dL   Triglycerides 148  <150 mg/dL   HDL 50  >39 mg/dL   Total CHOL/HDL Ratio 3.0     VLDL 30  0 - 40 mg/dL   LDL Cholesterol 69  0 - 99 mg/dL   Comment:            Total Cholesterol/HDL:CHD Risk     Coronary Heart Disease Risk Table  Men   Women      1/2 Average Risk   3.4   3.3      Average Risk       5.0   4.4      2 X Average Risk   9.6   7.1      3 X Average Risk  23.4   11.0                Use the calculated Patient Ratio     above and the CHD Risk Table     to determine the patient's CHD Risk.                ATP III CLASSIFICATION (LDL):      <100     mg/dL   Optimal      100-129  mg/dL   Near or Above                        Optimal      130-159  mg/dL   Borderline      160-189  mg/dL   High      >190     mg/dL   Very High  GLUCOSE, CAPILLARY     Status: Abnormal   Collection Time    01/14/14  6:23 AM      Result Value Ref Range   Glucose-Capillary 123 (*) 70 - 99 mg/dL  GLUCOSE, CAPILLARY     Status: Abnormal   Collection Time    01/14/14  7:26 AM      Result Value Ref Range   Glucose-Capillary 140 (*) 70 - 99 mg/dL   Comment 1 Notify RN    GLUCOSE, CAPILLARY     Status: Abnormal   Collection Time    01/14/14  9:56 AM      Result Value Ref Range   Glucose-Capillary 112 (*) 70 - 99 mg/dL   Comment 1 Notify RN    GLUCOSE, CAPILLARY      Status: Abnormal   Collection Time    01/14/14 11:41 AM      Result Value Ref Range   Glucose-Capillary 109 (*) 70 - 99 mg/dL   Comment 1 Notify RN    GLUCOSE, CAPILLARY     Status: Abnormal   Collection Time    01/14/14  2:27 PM      Result Value Ref Range   Glucose-Capillary 185 (*) 70 - 99 mg/dL   Comment 1 Notify RN    GLUCOSE, CAPILLARY     Status: Abnormal   Collection Time    01/14/14  4:07 PM      Result Value Ref Range   Glucose-Capillary 218 (*) 70 - 99 mg/dL   Comment 1 Notify RN    GLUCOSE, CAPILLARY     Status: Abnormal   Collection Time    01/14/14  6:25 PM      Result Value Ref Range   Glucose-Capillary 265 (*) 70 - 99 mg/dL   Comment 1 Notify RN      Dg Chest 2 View  01/13/2014   CLINICAL DATA:  Short of breath.  EXAM: CHEST  2 VIEW  COMPARISON:  06/10/2013  FINDINGS: Exam limited by low lung volumes and supine patient positioning as well as the patient's body habitus.  Cardiac silhouette is mildly enlarged. No mediastinal or hilar masses. Mildly prominent interstitial markings. No lung consolidation. No convincing edema.  Bony thorax is grossly intact.  IMPRESSION:  Limited exam.  No acute cardiopulmonary disease.   Electronically Signed   By: Lajean Manes M.D.   On: 01/13/2014 09:13   Ct Head Wo Contrast  01/13/2014   CLINICAL DATA:  Headache.  Hyperglycemia.  Altered mental status.  EXAM: CT HEAD WITHOUT CONTRAST  TECHNIQUE: Contiguous axial images were obtained from the base of the skull through the vertex without intravenous contrast.  COMPARISON:  CT of the head performed 12/05/2013  FINDINGS: There is no evidence of acute infarction, mass lesion, or intra- or extra-axial hemorrhage on CT. Evaluation is suboptimal due to motion artifact.  Mild periventricular white matter change may reflect small vessel ischemic microangiopathy. Mild chronic ischemic change is noted at the anterior limb of the right internal capsule, unchanged from prior studies.  The posterior  fossa, including the cerebellum, brainstem and fourth ventricle, is within normal limits. The third and lateral ventricles are unremarkable in appearance. The cerebral hemispheres are symmetric in appearance, with normal gray-white differentiation. No mass effect or midline shift is seen.  There is no evidence of fracture; visualized osseous structures are unremarkable in appearance. The visualized portions of the orbits are within normal limits. The paranasal sinuses and mastoid air cells are well-aerated. No significant soft tissue abnormalities are seen.  IMPRESSION: 1. No acute intracranial pathology seen on CT. 2. Mild small vessel ischemic microangiopathy, with mild chronic ischemic change at the anterior limb of the right internal capsule.   Electronically Signed   By: Garald Balding M.D.   On: 01/13/2014 04:34   Mr [name] Wo Contrast  01/14/2014   CLINICAL DATA:  68 year old female with weakness, headache, possible expressive aphasia. Initial encounter.  EXAM: MRI HEAD WITHOUT CONTRAST  MRA HEAD WITHOUT CONTRAST  TECHNIQUE: Multiplanar, multiecho pulse sequences of the brain and surrounding structures were obtained without intravenous contrast. Angiographic images of the head were obtained using MRA technique without contrast.  COMPARISON:  Head CT without contrast 01/13/2014. Brain MRI and MRA 03/16/2013.  FINDINGS: MRI HEAD FINDINGS  Mildly degraded by motion artifact despite repeated imaging attempts. Stable cerebral volume. Major intracranial vascular flow voids are stable. No restricted diffusion to suggest acute infarction. No midline shift, mass effect, evidence of mass lesion, ventriculomegaly, extra-axial collection or acute intracranial hemorrhage. Cervicomedullary junction and pituitary are within normal limits. Negative visualized cervical spine.  Nonspecific periventricular and scattered cerebral white matter sometimes Patchy and confluent T2 and FLAIR hyperintensity, not significantly  changed since 2014. No superimposed cortical encephalomalacia identified. Deep gray matter nuclei appear stable and within normal limits for age. Brainstem and cerebellum appear stable and within normal limits.  Visible internal auditory structures appear normal. Bilateral mastoid effusions have resolved. Minimal paranasal sinus mucosal thickening. Interval postoperative changes to both globes. Visualized scalp soft tissues are within normal limits. Normal bone marrow signal.  MRA HEAD FINDINGS  Mildly degraded by motion artifact despite repeated imaging attempts.  Stable antegrade flow in the posterior circulation with relatively codominant distal vertebral arteries, but the right functionally terminating in PICA. Stable and normal PICA origins. Stable basilar artery patency without stenosis. Fetal type left PCA origin re- identified. Stable posterior communicating arteries. Bilateral PCA branches are stable.  Stable antegrade flow in both ICA siphons. Stable signal loss in the left ICA siphon anterior genu suggesting hemodynamically significant stenosis. Both ICA termini remain patent, with diminutive or absent left ACA A1 segment as before. Anterior communicating artery and visualized bilateral ACA branches remain within normal limits. Both MCA origins remain normal. Visualized  bilateral MCA branches are stable and within normal limits.  IMPRESSION: 1. No acute intracranial abnormality. Stable non contrast MRI appearance of the brain since 2014. 2. Stable intracranial MRA, negative except for chronic hemodynamically significant stenosis at the left ICA siphon anterior genu. 3. Interval resolved mastoid effusions.   Electronically Signed   By: Lars Pinks M.D.   On: 01/14/2014 11:36   Mri Brain Without Contrast  01/14/2014   CLINICAL DATA:  68 year old female with weakness, headache, possible expressive aphasia. Initial encounter.  EXAM: MRI HEAD WITHOUT CONTRAST  MRA HEAD WITHOUT CONTRAST  TECHNIQUE:  Multiplanar, multiecho pulse sequences of the brain and surrounding structures were obtained without intravenous contrast. Angiographic images of the head were obtained using MRA technique without contrast.  COMPARISON:  Head CT without contrast 01/13/2014. Brain MRI and MRA 03/16/2013.  FINDINGS: MRI HEAD FINDINGS  Mildly degraded by motion artifact despite repeated imaging attempts. Stable cerebral volume. Major intracranial vascular flow voids are stable. No restricted diffusion to suggest acute infarction. No midline shift, mass effect, evidence of mass lesion, ventriculomegaly, extra-axial collection or acute intracranial hemorrhage. Cervicomedullary junction and pituitary are within normal limits. Negative visualized cervical spine.  Nonspecific periventricular and scattered cerebral white matter sometimes Patchy and confluent T2 and FLAIR hyperintensity, not significantly changed since 2014. No superimposed cortical encephalomalacia identified. Deep gray matter nuclei appear stable and within normal limits for age. Brainstem and cerebellum appear stable and within normal limits.  Visible internal auditory structures appear normal. Bilateral mastoid effusions have resolved. Minimal paranasal sinus mucosal thickening. Interval postoperative changes to both globes. Visualized scalp soft tissues are within normal limits. Normal bone marrow signal.  MRA HEAD FINDINGS  Mildly degraded by motion artifact despite repeated imaging attempts.  Stable antegrade flow in the posterior circulation with relatively codominant distal vertebral arteries, but the right functionally terminating in PICA. Stable and normal PICA origins. Stable basilar artery patency without stenosis. Fetal type left PCA origin re- identified. Stable posterior communicating arteries. Bilateral PCA branches are stable.  Stable antegrade flow in both ICA siphons. Stable signal loss in the left ICA siphon anterior genu suggesting hemodynamically  significant stenosis. Both ICA termini remain patent, with diminutive or absent left ACA A1 segment as before. Anterior communicating artery and visualized bilateral ACA branches remain within normal limits. Both MCA origins remain normal. Visualized bilateral MCA branches are stable and within normal limits.  IMPRESSION: 1. No acute intracranial abnormality. Stable non contrast MRI appearance of the brain since 2014. 2. Stable intracranial MRA, negative except for chronic hemodynamically significant stenosis at the left ICA siphon anterior genu. 3. Interval resolved mastoid effusions.   Electronically Signed   By: Lars Pinks M.D.   On: 01/14/2014 11:36   US Carotid Duplex Bilateral  01/14/2014   CLINICAL DATA:  TIA  EXAM: BILATERAL CAROTID DUPLEX ULTRASOUND  TECHNIQUE: Pearline Cables scale imaging, color Doppler and duplex ultrasound was performed of bilateral carotid and vertebral arteries in the neck.  COMPARISON:  03/16/2013  REVIEW OF SYSTEMS: Quantification of carotid stenosis is based on velocity parameters that correlate the residual internal carotid diameter with NASCET-based stenosis levels, using the diameter of the distal internal carotid lumen as the denominator for stenosis measurement.  The following velocity measurements were obtained:  PEAK SYSTOLIC/END DIASTOLIC  RIGHT  ICA:                     101/31cm/sec  CCA:  86/82BR/KVT  SYSTOLIC ICA/CCA RATIO:  5.52  DIASTOLIC ICA/CCA RATIO: 1.74  ECA:                     77cm/sec  LEFT  ICA:                     99/24cm/sec  CCA:                     71/59BZ/XYD  SYSTOLIC ICA/CCA RATIO:  2.89  DIASTOLIC ICA/CCA RATIO: 7.91  ECA:                     56cm/sec  FINDINGS: RIGHT CAROTID ARTERY: Scattered eccentric nonocclusive plaque in the common carotid artery. Circumferential partially calcified plaque in the carotid bulb extending into proximal internal and external carotid arteries without high-grade stenosis. High bifurcation. Normal waveforms and  color Doppler signal.  RIGHT VERTEBRAL ARTERY:  Normal flow direction and waveform.  LEFT CAROTID ARTERY: Thyroid lesion incidentally noted. Mild plaque in the carotid bulb and in the proximal ICA without high-grade stenosis. High bifurcation. Normal waveforms and color Doppler signal.  LEFT VERTEBRAL ARTERY: Normal flow direction and waveform.  IMPRESSION: 1. Mild bilateral carotid bifurcation and proximal ICA plaque resulting in less than 50% diameter stenosis. The exam does not exclude plaque ulceration or embolization. Continued surveillance recommended. 2. Left thyroid lesion, incompletely characterized. If clinically relevant, thyroid ultrasound may be useful for further assessment.   Electronically Signed   By: Arne Cleveland M.D.   On: 01/14/2014 10:53        Ahkeem Goede A. Merlene Laughter, M.D.  Diplomate, Tax adviser of Psychiatry and Neurology ( Neurology). 01/14/2014, 6:42 PM

## 2014-01-14 NOTE — Care Management Note (Addendum)
    Page 1 of 2   01/15/2014     2:02:23 PM CARE MANAGEMENT NOTE 01/15/2014  Patient:  ADDY, IREY   Account Number:  [id number]  Date Initiated:  01/14/2014  Documentation initiated by:  Theophilus Kinds  Subjective/Objective Assessment:   Pt admitted from home with encephalopathy. Pt lives with her husband and will return home at discharge. Pt very alert and oriented at this time. Pt stated that she has 2 canes, walker,right leg prosthesis and motorized scooter for home use     Action/Plan:   Will continue to follow for discharge planning needs. May need HH at discharge.   Anticipated DC Date:  01/15/2014   Anticipated DC Plan:  [place]  CM consult      [facility] Choice  HOME HEALTH   Choice offered to / List presented to:  C-1 Patient        [place] arranged  HH-1 RN      [place].   Status of service:  Completed, signed off Medicare Important Message given?  YES (If response is "NO", the following Medicare IM given date fields will be blank) Date Medicare IM given:  01/15/2014 Medicare IM given by:  Theophilus Kinds Date Additional Medicare IM given:   Additional Medicare IM given by:    Discharge Disposition:  [place]  Per UR Regulation:    If discussed at Long Length of Stay Meetings, dates discussed:    Comments:  01/15/14 [place], RN BSN CM Pt discharged home today with [facility] RN (per pts choice). Romualdo Bolk of [facility] is aware and will collect the pts information from the chart. [place] services to start within 48 hours of discharge. No DME needs noted. Pt and pts nurse aware of discharge arrangements.  01/14/14 [place], RN BSN CM

## 2014-01-14 NOTE — Clinical Social Work Note (Addendum)
CSW received consult. Pt to have repeat assessment today per note yesterday. CSW called [place] to follow up and Gershon Mussel reports they will complete. Will follow up as needed.   Benay Pike, [place]

## 2014-01-14 NOTE — Progress Notes (Signed)
Inpatient Diabetes Program Recommendations  AACE/ADA: New Consensus Statement on Inpatient Glycemic Control (2013)  Target Ranges:  Prepandial:   less than 140 mg/dL      Peak postprandial:   less than 180 mg/dL (1-2 hours)      Critically ill patients:  140 - 180 mg/dL   Results for Mary [name], Mary [name] (MRN [id number]) as of 01/14/2014 08:31  Ref. Range 01/13/2014 16:40  Hemoglobin A1C Latest Range: <5.7 % 10.9 (H)   Results for Mary [name], Mary [name] (MRN [id number]) as of 01/14/2014 08:31  Ref. Range 01/14/2014 00:11 01/14/2014 01:32 01/14/2014 02:12 01/14/2014 03:42 01/14/2014 03:43 01/14/2014 04:07 01/14/2014 04:30 01/14/2014 05:34 01/14/2014 06:23 01/14/2014 07:26  Glucose-Capillary Latest Range: 70-99 mg/dL 109 (H) 61 (L) 72 34 (LL) 31 (LL) 93 113 (H) 122 (H) 123 (H) 140 (H)   Diabetes history: DM2 Outpatient Diabetes medications: Humalog 50/50 80-90 units QAM, Humalog 50/50 80-90 units QPM, Humalog 50/50 55 units QHS Current orders for Inpatient glycemic control: Novolog 0-15 units Q4H  Inpatient Diabetes Program Recommendations Insulin - Basal: Please consider order low dose basal insulin once CBGs stablized greater than 180 mg/dl without hypoglycemic treatment. Would recommend starting with Levemir 25 units Q24H (based on 129 kg x 0.2 units). HgbA1C: A1C 10.9% on 01/13/14 indicating poor glycemic control over the past 2-3 months.  Thanks, Barnie Alderman, RN, MSN, CCRN Diabetes Coordinator Inpatient Diabetes Program [phone] (Team Pager) [phone] ([facility] office) [phone] [facility] office)

## 2014-01-14 NOTE — Plan of Care (Signed)
Psych called to set up re-assessment via tele psych consult.  Waiting on appointment on schedule.  Said they'd call about 1/2 hour prior to appointment when they know.

## 2014-01-14 NOTE — Progress Notes (Signed)
  Echocardiogram 2D Echocardiogram has been performed.  [place], [place] 01/14/2014, 4:03 PM

## 2014-01-14 NOTE — Progress Notes (Signed)
Hypoglycemic Event  CBG: 61  Treatment: D50 IV 25 mL  Symptoms: None  Follow-up CBG: Time:0212 CBG Result:072  Possible Reasons for Event: Inadequate meal intake  Comments/MD notified: followed hypoglycemic protocol    Corrinne Eagle  Remember to initiate Hypoglycemia Order Set & complete

## 2014-01-14 NOTE — Progress Notes (Signed)
Hypoglycemic Event  CBG: 34  Treatment: 15 GM gel  Symptoms: Sweaty  Follow-up CBG: [id number] CBG Result:93  Possible Reasons for Event: Inadequate meal intake  Comments/MD notified:Dr Stinson  Hypoglycemic protocol followed  No new orders given at this time    Corrinne Eagle  Remember to initiate Hypoglycemia Order Set & complete

## 2014-01-14 NOTE — Progress Notes (Signed)
[facility] PROGRESS NOTE  Mary [name] [id number] DOB: 1946/02/19 DOA: 01/12/2014 PCP: Delphina Cahill, MD  Assessment/Plan: 1. Acute delirium. Etiology is not entirely clear, but symptoms appear to have resolved. The patient is awake, alert, behaving appropriately. Workup has been unremarkable. MRI of the brain did not show any acute process. Neurology consultation is pending. The patient does report that she took some over-the-counter herbal medication, Valerian root, to help her sleep. Her due to side effects include headache, restlessness, excitability. This may be due to her current presentation. We'll await further recommendations from neurology. She will also need repeat psychiatry evaluation prior to discharge since she did express threatening behavior towards her husband. 2. Possible expressive aphasia. This may have been related to her delirium, rather than a true TIA. Workup has been unremarkable. 3. Acute on chronic diastolic congestive heart failure. Improved with IV Lasix. Will transition to by mouth. She appears to be euvolemic 4. Diabetes. Patient had hypoglycemia this morning. She had been n.p.o. Since she has been restarted on her diet, her blood sugars appears to be improving. She has a fairly large insulin requirement. She reports noncompliance with her diet at home. We'll restart her insulin regimen, a lower dose. Monitor blood sugars. 5. Restless leg syndrome. Continue Requip  Code Status: DNR Family Communication: discussed with patient and husband at the bedside Disposition Plan: discharge home, possibly in am   Consultants:  Neurology consult pending  Psychiatry consult pending  Procedures:    Antibiotics:    HPI/Subjective: Patient is awake, alert, oriented. Does not recall her ER course. Does not have any complaints at this time  Objective: Filed Vitals:   01/14/14 1613  BP: 142/62  Pulse:   Temp:   Resp:     Intake/Output Summary (Last  24 hours) at 01/14/14 1733 Last data filed at 01/14/14 1336  Gross per 24 hour  Intake  987.5 ml  Output   1950 ml  Net -962.5 ml   Filed Weights   01/12/14 2332 01/13/14 1608  Weight: 111.131 kg (245 lb) 129.729 kg (286 lb)    Exam:   General:  NAD, awake, alert, oriented  Cardiovascular: s1, s2, rrr  Respiratory: CTA B  Abdomen: soft, nt, nd bs+  Musculoskeletal: no edema b/l   Data Reviewed: Basic Metabolic Panel:  Recent Labs Lab 01/12/14 2359 01/14/14 0548  NA 134* 143  K 4.6 3.9  CL 97 100  CO2 25 28  GLUCOSE 415* 118*  BUN 28* 29*  CREATININE 1.01 1.17*  CALCIUM 8.3* 8.2*   Liver Function Tests: No results found for this basename: AST, ALT, ALKPHOS, BILITOT, PROT, ALBUMIN,  in the last 168 hours No results found for this basename: LIPASE, AMYLASE,  in the last 168 hours  Recent Labs Lab 01/13/14 1640  AMMONIA 38   CBC:  Recent Labs Lab 01/12/14 2359  WBC 10.4  NEUTROABS 8.2*  HGB 11.2*  HCT 33.5*  MCV 84.0  PLT 269   Cardiac Enzymes:  Recent Labs Lab 01/13/14 1010  TROPONINI <0.30   BNP (last 3 results)  Recent Labs  06/10/13 0630 01/13/14 1010  PROBNP 1779.0* 4362.0*   CBG:  Recent Labs Lab 01/14/14 0726 01/14/14 0956 01/14/14 1141 01/14/14 1427 01/14/14 1607  GLUCAP 140* 112* 109* 185* 218*    No results found for this or any previous visit (from the past 240 hour(s)).   Studies: Dg Chest 2 View  01/13/2014   CLINICAL DATA:  Short of breath.  EXAM:  CHEST  2 VIEW  COMPARISON:  06/10/2013  FINDINGS: Exam limited by low lung volumes and supine patient positioning as well as the patient's body habitus.  Cardiac silhouette is mildly enlarged. No mediastinal or hilar masses. Mildly prominent interstitial markings. No lung consolidation. No convincing edema.  Bony thorax is grossly intact.  IMPRESSION: Limited exam.  No acute cardiopulmonary disease.   Electronically Signed   By: Lajean Manes M.D.   On: 01/13/2014 09:13    Ct Head Wo Contrast  01/13/2014   CLINICAL DATA:  Headache.  Hyperglycemia.  Altered mental status.  EXAM: CT HEAD WITHOUT CONTRAST  TECHNIQUE: Contiguous axial images were obtained from the base of the skull through the vertex without intravenous contrast.  COMPARISON:  CT of the head performed 12/05/2013  FINDINGS: There is no evidence of acute infarction, mass lesion, or intra- or extra-axial hemorrhage on CT. Evaluation is suboptimal due to motion artifact.  Mild periventricular white matter change may reflect small vessel ischemic microangiopathy. Mild chronic ischemic change is noted at the anterior limb of the right internal capsule, unchanged from prior studies.  The posterior fossa, including the cerebellum, brainstem and fourth ventricle, is within normal limits. The third and lateral ventricles are unremarkable in appearance. The cerebral hemispheres are symmetric in appearance, with normal gray-white differentiation. No mass effect or midline shift is seen.  There is no evidence of fracture; visualized osseous structures are unremarkable in appearance. The visualized portions of the orbits are within normal limits. The paranasal sinuses and mastoid air cells are well-aerated. No significant soft tissue abnormalities are seen.  IMPRESSION: 1. No acute intracranial pathology seen on CT. 2. Mild small vessel ischemic microangiopathy, with mild chronic ischemic change at the anterior limb of the right internal capsule.   Electronically Signed   By: Garald Balding M.D.   On: 01/13/2014 04:34   Mr [name] Wo Contrast  01/14/2014   CLINICAL DATA:  68 year old female with weakness, headache, possible expressive aphasia. Initial encounter.  EXAM: MRI HEAD WITHOUT CONTRAST  MRA HEAD WITHOUT CONTRAST  TECHNIQUE: Multiplanar, multiecho pulse sequences of the brain and surrounding structures were obtained without intravenous contrast. Angiographic images of the head were obtained using MRA technique without  contrast.  COMPARISON:  Head CT without contrast 01/13/2014. Brain MRI and MRA 03/16/2013.  FINDINGS: MRI HEAD FINDINGS  Mildly degraded by motion artifact despite repeated imaging attempts. Stable cerebral volume. Major intracranial vascular flow voids are stable. No restricted diffusion to suggest acute infarction. No midline shift, mass effect, evidence of mass lesion, ventriculomegaly, extra-axial collection or acute intracranial hemorrhage. Cervicomedullary junction and pituitary are within normal limits. Negative visualized cervical spine.  Nonspecific periventricular and scattered cerebral white matter sometimes Patchy and confluent T2 and FLAIR hyperintensity, not significantly changed since 2014. No superimposed cortical encephalomalacia identified. Deep gray matter nuclei appear stable and within normal limits for age. Brainstem and cerebellum appear stable and within normal limits.  Visible internal auditory structures appear normal. Bilateral mastoid effusions have resolved. Minimal paranasal sinus mucosal thickening. Interval postoperative changes to both globes. Visualized scalp soft tissues are within normal limits. Normal bone marrow signal.  MRA HEAD FINDINGS  Mildly degraded by motion artifact despite repeated imaging attempts.  Stable antegrade flow in the posterior circulation with relatively codominant distal vertebral arteries, but the right functionally terminating in PICA. Stable and normal PICA origins. Stable basilar artery patency without stenosis. Fetal type left PCA origin re- identified. Stable posterior communicating arteries. Bilateral PCA branches are  stable.  Stable antegrade flow in both ICA siphons. Stable signal loss in the left ICA siphon anterior genu suggesting hemodynamically significant stenosis. Both ICA termini remain patent, with diminutive or absent left ACA A1 segment as before. Anterior communicating artery and visualized bilateral ACA branches remain within normal  limits. Both MCA origins remain normal. Visualized bilateral MCA branches are stable and within normal limits.  IMPRESSION: 1. No acute intracranial abnormality. Stable non contrast MRI appearance of the brain since 2014. 2. Stable intracranial MRA, negative except for chronic hemodynamically significant stenosis at the left ICA siphon anterior genu. 3. Interval resolved mastoid effusions.   Electronically Signed   By: Lars Pinks M.D.   On: 01/14/2014 11:36   Mri Brain Without Contrast  01/14/2014   CLINICAL DATA:  68 year old female with weakness, headache, possible expressive aphasia. Initial encounter.  EXAM: MRI HEAD WITHOUT CONTRAST  MRA HEAD WITHOUT CONTRAST  TECHNIQUE: Multiplanar, multiecho pulse sequences of the brain and surrounding structures were obtained without intravenous contrast. Angiographic images of the head were obtained using MRA technique without contrast.  COMPARISON:  Head CT without contrast 01/13/2014. Brain MRI and MRA 03/16/2013.  FINDINGS: MRI HEAD FINDINGS  Mildly degraded by motion artifact despite repeated imaging attempts. Stable cerebral volume. Major intracranial vascular flow voids are stable. No restricted diffusion to suggest acute infarction. No midline shift, mass effect, evidence of mass lesion, ventriculomegaly, extra-axial collection or acute intracranial hemorrhage. Cervicomedullary junction and pituitary are within normal limits. Negative visualized cervical spine.  Nonspecific periventricular and scattered cerebral white matter sometimes Patchy and confluent T2 and FLAIR hyperintensity, not significantly changed since 2014. No superimposed cortical encephalomalacia identified. Deep gray matter nuclei appear stable and within normal limits for age. Brainstem and cerebellum appear stable and within normal limits.  Visible internal auditory structures appear normal. Bilateral mastoid effusions have resolved. Minimal paranasal sinus mucosal thickening. Interval  postoperative changes to both globes. Visualized scalp soft tissues are within normal limits. Normal bone marrow signal.  MRA HEAD FINDINGS  Mildly degraded by motion artifact despite repeated imaging attempts.  Stable antegrade flow in the posterior circulation with relatively codominant distal vertebral arteries, but the right functionally terminating in PICA. Stable and normal PICA origins. Stable basilar artery patency without stenosis. Fetal type left PCA origin re- identified. Stable posterior communicating arteries. Bilateral PCA branches are stable.  Stable antegrade flow in both ICA siphons. Stable signal loss in the left ICA siphon anterior genu suggesting hemodynamically significant stenosis. Both ICA termini remain patent, with diminutive or absent left ACA A1 segment as before. Anterior communicating artery and visualized bilateral ACA branches remain within normal limits. Both MCA origins remain normal. Visualized bilateral MCA branches are stable and within normal limits.  IMPRESSION: 1. No acute intracranial abnormality. Stable non contrast MRI appearance of the brain since 2014. 2. Stable intracranial MRA, negative except for chronic hemodynamically significant stenosis at the left ICA siphon anterior genu. 3. Interval resolved mastoid effusions.   Electronically Signed   By: Lars Pinks M.D.   On: 01/14/2014 11:36   US Carotid Duplex Bilateral  01/14/2014   CLINICAL DATA:  TIA  EXAM: BILATERAL CAROTID DUPLEX ULTRASOUND  TECHNIQUE: Pearline Cables scale imaging, color Doppler and duplex ultrasound was performed of bilateral carotid and vertebral arteries in the neck.  COMPARISON:  03/16/2013  REVIEW OF SYSTEMS: Quantification of carotid stenosis is based on velocity parameters that correlate the residual internal carotid diameter with NASCET-based stenosis levels, using the diameter of the distal internal carotid  lumen as the denominator for stenosis measurement.  The following velocity measurements were  obtained:  PEAK SYSTOLIC/END DIASTOLIC  RIGHT  ICA:                     101/31cm/sec  CCA:                     [id number]  SYSTOLIC ICA/CCA RATIO:  [id number]  DIASTOLIC ICA/CCA RATIO: [id number]  ECA:                     77cm/sec  LEFT  ICA:                     99/24cm/sec  CCA:                     [id number]  SYSTOLIC ICA/CCA RATIO:  [id number]  DIASTOLIC ICA/CCA RATIO: [id number]  ECA:                     56cm/sec  FINDINGS: RIGHT CAROTID ARTERY: Scattered eccentric nonocclusive plaque in the common carotid artery. Circumferential partially calcified plaque in the carotid bulb extending into proximal internal and external carotid arteries without high-grade stenosis. High bifurcation. Normal waveforms and color Doppler signal.  RIGHT VERTEBRAL ARTERY:  Normal flow direction and waveform.  LEFT CAROTID ARTERY: Thyroid lesion incidentally noted. Mild plaque in the carotid bulb and in the proximal ICA without high-grade stenosis. High bifurcation. Normal waveforms and color Doppler signal.  LEFT VERTEBRAL ARTERY: Normal flow direction and waveform.  IMPRESSION: 1. Mild bilateral carotid bifurcation and proximal ICA plaque resulting in less than 50% diameter stenosis. The exam does not exclude plaque ulceration or embolization. Continued surveillance recommended. 2. Left thyroid lesion, incompletely characterized. If clinically relevant, thyroid ultrasound may be useful for further assessment.   Electronically Signed   By: Arne Cleveland M.D.   On: 01/14/2014 10:53    Scheduled Meds: . aspirin  325 mg Oral Daily  . cholecalciferol  1,000 Units Oral Daily  . cilostazol  100 mg Oral BID  . furosemide  20 mg Oral Daily  . gabapentin  600 mg Oral TID  . insulin aspart  0-15 Units Subcutaneous Q4H  . metoprolol  50 mg Oral BID  . rOPINIRole  2 mg Oral QPM  . simvastatin  20 mg Oral q1800  . spironolactone  25 mg Oral Daily   Continuous Infusions: . dextrose 5 % and 0.9% NaCl 50 mL/hr at 01/13/14 1823    Principal Problem:    Acute encephalopathy Active Problems:   Diabetes mellitus with neuropathy   Acute respiratory failure   Restless leg syndrome   Hypertension   Hx of right BKA   Delirium   Diastolic CHF, acute on chronic   Expressive aphasia    Time spent: 82mins    Savva Beamer  [facility] Pager [phone]. If 7PM-7AM, please contact night-coverage at [email or web address], password [email or web address] 01/14/2014, 5:33 PM  LOS: 2 days

## 2014-01-15 LAB — BASIC METABOLIC PANEL
Anion gap: 8 (ref 5–15)
BUN: 36 mg/dL — AB (ref 6–23)
CHLORIDE: 105 meq/L (ref 96–112)
CO2: 32 mEq/L (ref 19–32)
CREATININE: 1.31 mg/dL — AB (ref 0.50–1.10)
Calcium: 8 mg/dL — ABNORMAL LOW (ref 8.4–10.5)
GFR calc Af Amer: 47 mL/min — ABNORMAL LOW (ref >90)
GFR calc non Af Amer: 41 mL/min — ABNORMAL LOW (ref >90)
GLUCOSE: 81 mg/dL (ref 70–99)
POTASSIUM: 4.5 meq/L (ref 3.7–5.3)
Sodium: 145 mEq/L (ref 137–147)

## 2014-01-15 LAB — GLUCOSE, CAPILLARY
GLUCOSE-CAPILLARY: 132 mg/dL — AB (ref 70–99)
GLUCOSE-CAPILLARY: 144 mg/dL — AB (ref 70–99)
GLUCOSE-CAPILLARY: 77 mg/dL (ref 70–99)
Glucose-Capillary: 34 mg/dL — CL (ref 70–99)
Glucose-Capillary: 92 mg/dL (ref 70–99)
Glucose-Capillary: 100 mg/dL — ABNORMAL HIGH (ref 70–99)
Glucose-Capillary: 237 mg/dL — ABNORMAL HIGH (ref 70–99)
Glucose-Capillary: 173 mg/dL — ABNORMAL HIGH (ref 70–99)

## 2014-01-15 LAB — CBC
HEMATOCRIT: 32.1 % — AB (ref 36.0–46.0)
HEMOGLOBIN: 10.7 g/dL — AB (ref 12.0–15.0)
MCH: 28.8 pg (ref 26.0–34.0)
MCHC: 33.3 g/dL (ref 30.0–36.0)
MCV: 86.3 fL (ref 78.0–100.0)
Platelets: 243 10*3/uL (ref 150–400)
RBC: 3.72 MIL/uL — AB (ref 3.87–5.11)
RDW: 14.4 % (ref 11.5–15.5)
WBC: 7.2 10*3/uL (ref 4.0–10.5)

## 2014-01-15 MED ORDER — INSULIN LISPRO PROT & LISPRO (50-50 MIX) 100 UNIT/ML ~~LOC~~ SUSP: 40.0000 [IU] | Freq: Three times a day (TID) | SUBCUTANEOUS | Status: DC

## 2014-01-15 MED ORDER — INSULIN ASPART PROT & ASPART (70-30 MIX) 100 UNIT/ML ~~LOC~~ SUSP: 40.0000 [IU] | Freq: Once | SUBCUTANEOUS | Status: AC

## 2014-01-15 MED ORDER — INSULIN ASPART PROT & ASPART (70-30 MIX) 100 UNIT/ML ~~LOC~~ SUSP: 40.0000 [IU] | Freq: Once | SUBCUTANEOUS | Status: DC

## 2014-01-15 NOTE — Clinical Social Work Note (Signed)
Pt no longer requiring inpatient treatment. MD rescinded IVC and this was faxed to clerk of court. Pt to d/c home today per MD.   Benay Pike, [place]

## 2014-01-15 NOTE — Discharge Instructions (Signed)

## 2014-01-15 NOTE — Consult Note (Signed)
Telepsych Consultation   Reason for Consult:  Possible psychosis vs. Delirium Referring Physician: TRH KEMBA [name] is an 68 y.o. female.  Assessment: AXIS I:  Acute psychosis (likely medical etiology) AXIS II:  Deferred AXIS III:   Past Medical History  Diagnosis Date  . CHF (congestive heart failure)   . Diabetes mellitus   . Restless leg syndrome 07/25/2011  . Wound, open     right foot  . Diabetes mellitus with neuropathy 07/25/2011  . Cellulitis and abscess of foot 02/12/2012  . Osteomyelitis of right foot 02/14/2012  . Partial Achilles tendon tear 02/14/2012  . Dysrhythmia     tachy- takes Metoprolol  . OSA (obstructive sleep apnea) 07/25/2011    not using CPAP  . Arthritis     knees  . Anemia   . Complication of anesthesia     pt states after her hysterectomy the doctor said she was "wild" when she woke up    AXIS IV:  other psychosocial or environmental problems and problems related to social environment AXIS V:  61-70 mild symptoms  Plan:  No evidence of imminent risk to self or others at present.   Patient does not meet criteria for psychiatric inpatient admission. Supportive therapy provided about ongoing stressors.  Subjective:   Mary [name] is a 68 y.o. female patient admitted with possible acute psychosis. Pt reportedly made statements about getting a gun. Pt has no memory of this and was likely in acute delirium when these barely coherent (per nursing report) statements were made. Pt's CBG was mid 500's at the time and she had just taken a sleeping pill as well. Pt denies SI, HI, and AVH, contracts for safety. Consults for this patient from other services also opine that this pt's acute psychosis is likely secondary to acute delirium, medical in origin. Pt presents as stable, at baseline, alert/oriented x 4, and answering questions appropriately.  HPI:  Pt presente dto the ED with elevated CBG and also had just taken a sleeping pill.   HPI Elements:    Location:  Generalized, AP. Quality:  Stable. Severity:  Moderate. Timing:  Transient. Duration:  Transient. Context:  Pt had CBG of mid 500's and had just taken a sleeping pill to go to bed.  Past Psychiatric History: Past Medical History  Diagnosis Date  . CHF (congestive heart failure)   . Diabetes mellitus   . Restless leg syndrome 07/25/2011  . Wound, open     right foot  . Diabetes mellitus with neuropathy 07/25/2011  . Cellulitis and abscess of foot 02/12/2012  . Osteomyelitis of right foot 02/14/2012  . Partial Achilles tendon tear 02/14/2012  . Dysrhythmia     tachy- takes Metoprolol  . OSA (obstructive sleep apnea) 07/25/2011    not using CPAP  . Arthritis     knees  . Anemia   . Complication of anesthesia     pt states after her hysterectomy the doctor said she was "wild" when she woke up     reports that she has never smoked. She has never used smokeless tobacco. She reports that she does not drink alcohol or use illicit drugs. Family History  Problem Relation Age of Onset  . Diabetes Sister    Family History Substance Abuse: No Family Supports: Yes, List: (husband) Living Arrangements: Spouse/significant other Can pt return to current living arrangement?: Yes Allergies:  No Known Allergies  ACT Assessment Complete:  Yes:    Educational Status    Risk  to Self: Risk to self with the past 6 months Suicidal Ideation: No Suicidal Intent: No Is patient at risk for suicide?: No (per report, sitter at bedside for safety) Suicidal Plan?: No Access to Means: No What has been your use of drugs/alcohol within the last 12 months?: none Previous Attempts/Gestures: No How many times?: 0 Other Self Harm Risks: none Triggers for Past Attempts: None known Intentional Self Injurious Behavior: None Family Suicide History: No Recent stressful life event(s):  (reports none) Persecutory voices/beliefs?: No Depression: No Depression Symptoms: Feeling  angry/irritable Substance abuse history and/or treatment for substance abuse?: No Suicide prevention information given to non-admitted patients: Not applicable  Risk to Others: Risk to Others within the past 6 months Homicidal Ideation: Yes-Currently Present Thoughts of Harm to Others: No Current Homicidal Intent:  (pt denies but was overheard stating she wants to get gun) Current Homicidal Plan: Yes-Currently Present Describe Current Homicidal Plan: shooting Access to Homicidal Means:  (gun in home, pt denies knowing where it is) Identified Victim: Husband Mary [name] per his report History of harm to others?: No Assessment of Violence: On admission Violent Behavior Description: husband reports she has been threatening to kill him Does patient have access to weapons?: Yes (Comment) Criminal Charges Pending?: No Does patient have a court date: No  Abuse: Abuse/Neglect Assessment (Assessment to be complete while patient is alone) Physical Abuse: Denies Verbal Abuse: Denies Sexual Abuse: Denies Exploitation of patient/patient's resources: Denies Self-Neglect: Denies  Prior Inpatient Therapy: Prior Inpatient Therapy Prior Inpatient Therapy: No Prior Therapy Dates: n/a Prior Therapy Facilty/Provider(s): n/a Reason for Treatment: n/a  Prior Outpatient Therapy: Prior Outpatient Therapy Prior Outpatient Therapy: No Prior Therapy Dates: n/a Prior Therapy Facilty/Provider(s): n/a Reason for Treatment: n/a  Additional Information: Additional Information 1:1 In Past 12 Months?: No CIRT Risk: Yes Elopement Risk: Yes Does patient have medical clearance?: No                  Objective: Blood pressure 152/64, pulse 80, temperature 98.8 F (37.1 C), temperature source Oral, resp. rate 20, height _0  (1.778 m), weight 134.446 kg (296 lb 6.4 oz), last menstrual period 06/01/1983, SpO2 94.00%.Body mass index is 42.53 kg/(m^2). Results for orders placed during the hospital  encounter of 01/12/14 (from the past 72 hour(s))  GLUCOSE, CAPILLARY     Status: Abnormal   Collection Time    01/12/14 11:30 PM      Result Value Ref Range   Glucose-Capillary 404 (*) 70 - 99 mg/dL  CBC WITH DIFFERENTIAL     Status: Abnormal   Collection Time    01/12/14 11:59 PM      Result Value Ref Range   WBC 10.4  4.0 - 10.5 K/uL   RBC 3.99  3.87 - 5.11 MIL/uL   Hemoglobin 11.2 (*) 12.0 - 15.0 g/dL   HCT 33.5 (*) 36.0 - 46.0 %   MCV 84.0  78.0 - 100.0 fL   MCH 28.1  26.0 - 34.0 pg   MCHC 33.4  30.0 - 36.0 g/dL   RDW 14.1  11.5 - 15.5 %   Platelets 269  150 - 400 K/uL   Neutrophils Relative % 79 (*) 43 - 77 %   Neutro Abs 8.2 (*) 1.7 - 7.7 K/uL   Lymphocytes Relative 11 (*) 12 - 46 %   Lymphs Abs 1.1  0.7 - 4.0 K/uL   Monocytes Relative 10  3 - 12 %   Monocytes Absolute 1.0  0.1 - 1.0  K/uL   Eosinophils Relative 0  0 - 5 %   Eosinophils Absolute 0.0  0.0 - 0.7 K/uL   Basophils Relative 0  0 - 1 %   Basophils Absolute 0.0  0.0 - 0.1 K/uL  BASIC METABOLIC PANEL     Status: Abnormal   Collection Time    01/12/14 11:59 PM      Result Value Ref Range   Sodium 134 (*) 137 - 147 mEq/L   Potassium 4.6  3.7 - 5.3 mEq/L   Chloride 97  96 - 112 mEq/L   CO2 25  19 - 32 mEq/L   Glucose, Bld 415 (*) 70 - 99 mg/dL   BUN 28 (*) 6 - 23 mg/dL   Creatinine, Ser 1.01  0.50 - 1.10 mg/dL   Calcium 8.3 (*) 8.4 - 10.5 mg/dL   GFR calc non Af Amer 56 (*) >90 mL/min   GFR calc Af Amer 65 (*) >90 mL/min   Comment: (NOTE)     The eGFR has been calculated using the CKD EPI equation.     This calculation has not been validated in all clinical situations.     eGFR's persistently <90 mL/min signify possible Chronic Kidney     Disease.   Anion gap 12  5 - 15  ETHANOL     Status: None   Collection Time    01/12/14 11:59 PM      Result Value Ref Range   Alcohol, Ethyl (B) <11  0 - 11 mg/dL   Comment:            LOWEST DETECTABLE LIMIT FOR     SERUM ALCOHOL IS 11 mg/dL     FOR MEDICAL  PURPOSES ONLY  CBG MONITORING, ED     Status: Abnormal   Collection Time    01/13/14  2:05 AM      Result Value Ref Range   Glucose-Capillary 254 (*) 70 - 99 mg/dL  URINE RAPID DRUG SCREEN (HOSP PERFORMED)     Status: None   Collection Time    01/13/14  2:32 AM      Result Value Ref Range   Opiates NONE DETECTED  NONE DETECTED   Cocaine NONE DETECTED  NONE DETECTED   Benzodiazepines NONE DETECTED  NONE DETECTED   Amphetamines NONE DETECTED  NONE DETECTED   Tetrahydrocannabinol NONE DETECTED  NONE DETECTED   Barbiturates NONE DETECTED  NONE DETECTED   Comment:            DRUG SCREEN FOR MEDICAL PURPOSES     ONLY.  IF CONFIRMATION IS NEEDED     FOR ANY PURPOSE, NOTIFY LAB     WITHIN 5 DAYS.                LOWEST DETECTABLE LIMITS     FOR URINE DRUG SCREEN     Drug Class       Cutoff (ng/mL)     Amphetamine      1000     Barbiturate      200     Benzodiazepine   962     Tricyclics       229     Opiates          300     Cocaine          300     THC              50  URINALYSIS, ROUTINE W REFLEX MICROSCOPIC  Status: Abnormal   Collection Time    01/13/14  2:32 AM      Result Value Ref Range   Color, Urine YELLOW  YELLOW   APPearance CLEAR  CLEAR   Specific Gravity, Urine 1.010  1.005 - 1.030   pH 7.0  5.0 - 8.0   Glucose, UA >1000 (*) NEGATIVE mg/dL   Hgb urine dipstick MODERATE (*) NEGATIVE   Bilirubin Urine NEGATIVE  NEGATIVE   Ketones, ur NEGATIVE  NEGATIVE mg/dL   Protein, ur 30 (*) NEGATIVE mg/dL   Urobilinogen, UA 0.2  0.0 - 1.0 mg/dL   Nitrite NEGATIVE  NEGATIVE   Leukocytes, UA NEGATIVE  NEGATIVE  URINE MICROSCOPIC-ADD ON     Status: None   Collection Time    01/13/14  2:32 AM      Result Value Ref Range   Squamous Epithelial / LPF RARE  RARE   RBC / HPF 7-10  <3 RBC/hpf  CBG MONITORING, ED     Status: Abnormal   Collection Time    01/13/14  4:15 AM      Result Value Ref Range   Glucose-Capillary 226 (*) 70 - 99 mg/dL   Comment 1 Documented in Chart      Comment 2 Notify RN    CBG MONITORING, ED     Status: Abnormal   Collection Time    01/13/14  7:22 AM      Result Value Ref Range   Glucose-Capillary 172 (*) 70 - 99 mg/dL  BLOOD GAS, ARTERIAL     Status: Abnormal   Collection Time    01/13/14  8:30 AM      Result Value Ref Range   FIO2 0.21     O2 Content 21.0     Delivery systems ROOM AIR     pH, Arterial 7.401  7.350 - 7.450   pCO2 arterial 42.2  35.0 - 45.0 mmHg   pO2, Arterial 58.4 (*) 80.0 - 100.0 mmHg   Bicarbonate 25.7 (*) 20.0 - 24.0 mEq/L   TCO2 22.9  0 - 100 mmol/L   Acid-Base Excess 1.4  0.0 - 2.0 mmol/L   O2 Saturation 89.3     Patient temperature 37.0     Collection site LEFT RADIAL     Drawn by [id number]     Sample type ARTERIAL     Allens test (pass/fail) PASS  PASS  D-DIMER, QUANTITATIVE     Status: None   Collection Time    01/13/14 10:10 AM      Result Value Ref Range   D-Dimer, Quant 0.44  0.00 - 0.48 ug/mL-FEU   Comment:            AT THE INHOUSE ESTABLISHED CUTOFF     VALUE OF 0.48 ug/mL FEU,     THIS ASSAY HAS BEEN DOCUMENTED     IN THE LITERATURE TO HAVE     A SENSITIVITY AND NEGATIVE     PREDICTIVE VALUE OF AT LEAST     98 TO 99%.  THE TEST RESULT     SHOULD BE CORRELATED WITH     AN ASSESSMENT OF THE CLINICAL     PROBABILITY OF DVT / VTE.  PRO B NATRIURETIC PEPTIDE     Status: Abnormal   Collection Time    01/13/14 10:10 AM      Result Value Ref Range   Pro B Natriuretic peptide (BNP) 4362.0 (*) 0 - 125 pg/mL  TROPONIN I     Status:  None   Collection Time    01/13/14 10:10 AM      Result Value Ref Range   Troponin I <0.30  <0.30 ng/mL   Comment:            Due to the release kinetics of cTnI,     a negative result within the first hours     of the onset of symptoms does not rule out     myocardial infarction with certainty.     If myocardial infarction is still suspected,     repeat the test at appropriate intervals.  CBG MONITORING, ED     Status: Abnormal   Collection Time     01/13/14  1:01 PM      Result Value Ref Range   Glucose-Capillary 147 (*) 70 - 99 mg/dL  CBG MONITORING, ED     Status: None   Collection Time    01/13/14  3:44 PM      Result Value Ref Range   Glucose-Capillary 96  70 - 99 mg/dL  GLUCOSE, CAPILLARY     Status: None   Collection Time    01/13/14  4:29 PM      Result Value Ref Range   Glucose-Capillary 82  70 - 99 mg/dL   Comment 1 Notify RN     Comment 2 Documented in Chart    HEMOGLOBIN A1C     Status: Abnormal   Collection Time    01/13/14  4:40 PM      Result Value Ref Range   Hemoglobin A1C 10.9 (*) <5.7 %   Comment: (NOTE)                                                                               According to the ADA Clinical Practice Recommendations for 2011, when     HbA1c is used as a screening test:      >=6.5%   Diagnostic of Diabetes Mellitus               (if abnormal result is confirmed)     5.7-6.4%   Increased risk of developing Diabetes Mellitus     References:Diagnosis and Classification of Diabetes Mellitus,Diabetes     TIRW,4315,40(GQQPY 1):S62-S69 and Standards of Medical Care in             Diabetes - 2011,Diabetes Care,2011,34 (Suppl 1):S11-S61.   Mean Plasma Glucose 266 (*) <117 mg/dL   Comment: Performed at [facility]  TSH     Status: None   Collection Time    01/13/14  4:40 PM      Result Value Ref Range   TSH 2.660  0.350 - 4.500 uIU/mL   Comment: Performed at [facility]     Status: None   Collection Time    01/13/14  4:40 PM      Result Value Ref Range   Ammonia 38  11 - 60 umol/L  GLUCOSE, CAPILLARY     Status: None   Collection Time    01/13/14  5:57 PM      Result Value Ref Range   Glucose-Capillary 79  70 - 99 mg/dL   Comment 1  Notify RN     Comment 2 Documented in Chart    GLUCOSE, CAPILLARY     Status: Abnormal   Collection Time    01/13/14  8:17 PM      Result Value Ref Range   Glucose-Capillary 103 (*) 70 - 99 mg/dL   Comment 1 Notify RN      Comment 2 Documented in Chart    GLUCOSE, CAPILLARY     Status: Abnormal   Collection Time    01/13/14 10:20 PM      Result Value Ref Range   Glucose-Capillary 172 (*) 70 - 99 mg/dL   Comment 1 Notify RN     Comment 2 Documented in Chart    GLUCOSE, CAPILLARY     Status: Abnormal   Collection Time    01/14/14 12:11 AM      Result Value Ref Range   Glucose-Capillary 109 (*) 70 - 99 mg/dL  GLUCOSE, CAPILLARY     Status: Abnormal   Collection Time    01/14/14  1:32 AM      Result Value Ref Range   Glucose-Capillary 61 (*) 70 - 99 mg/dL   Comment 1 Notify RN     Comment 2 Documented in Chart    GLUCOSE, CAPILLARY     Status: None   Collection Time    01/14/14  2:12 AM      Result Value Ref Range   Glucose-Capillary 72  70 - 99 mg/dL  GLUCOSE, CAPILLARY     Status: Abnormal   Collection Time    01/14/14  3:42 AM      Result Value Ref Range   Glucose-Capillary 34 (*) 70 - 99 mg/dL   Comment 1 Notify RN     Comment 2 Documented in Chart     Comment 3 Repeat Test    GLUCOSE, CAPILLARY     Status: Abnormal   Collection Time    01/14/14  3:43 AM      Result Value Ref Range   Glucose-Capillary 31 (*) 70 - 99 mg/dL   Comment 1 Notify RN     Comment 2 Documented in Chart    GLUCOSE, CAPILLARY     Status: None   Collection Time    01/14/14  4:07 AM      Result Value Ref Range   Glucose-Capillary 93  70 - 99 mg/dL  GLUCOSE, CAPILLARY     Status: Abnormal   Collection Time    01/14/14  4:30 AM      Result Value Ref Range   Glucose-Capillary 113 (*) 70 - 99 mg/dL  GLUCOSE, CAPILLARY     Status: Abnormal   Collection Time    01/14/14  5:34 AM      Result Value Ref Range   Glucose-Capillary 122 (*) 70 - 99 mg/dL  BASIC METABOLIC PANEL     Status: Abnormal   Collection Time    01/14/14  5:48 AM      Result Value Ref Range   Sodium 143  137 - 147 mEq/L   Comment: DELTA CHECK NOTED   Potassium 3.9  3.7 - 5.3 mEq/L   Chloride 100  96 - 112 mEq/L   CO2 28  19 - 32 mEq/L    Glucose, Bld 118 (*) 70 - 99 mg/dL   BUN 29 (*) 6 - 23 mg/dL   Creatinine, Ser 1.17 (*) 0.50 - 1.10 mg/dL   Calcium 8.2 (*) 8.4 - 10.5 mg/dL   GFR calc  non Af Amer 47 (*) >90 mL/min   GFR calc Af Amer 54 (*) >90 mL/min   Comment: (NOTE)     The eGFR has been calculated using the CKD EPI equation.     This calculation has not been validated in all clinical situations.     eGFR's persistently <90 mL/min signify possible Chronic Kidney     Disease.   Anion gap 15  5 - 15  LIPID PANEL     Status: None   Collection Time    01/14/14  5:48 AM      Result Value Ref Range   Cholesterol 149  0 - 200 mg/dL   Triglycerides 148  <150 mg/dL   HDL 50  >39 mg/dL   Total CHOL/HDL Ratio 3.0     VLDL 30  0 - 40 mg/dL   LDL Cholesterol 69  0 - 99 mg/dL   Comment:            Total Cholesterol/HDL:CHD Risk     Coronary Heart Disease Risk Table                         Men   Women      1/2 Average Risk   3.4   3.3      Average Risk       5.0   4.4      2 X Average Risk   9.6   7.1      3 X Average Risk  23.4   11.0                Use the calculated Patient Ratio     above and the CHD Risk Table     to determine the patient's CHD Risk.                ATP III CLASSIFICATION (LDL):      <100     mg/dL   Optimal      100-129  mg/dL   Near or Above                        Optimal      130-159  mg/dL   Borderline      160-189  mg/dL   High      >190     mg/dL   Very High  GLUCOSE, CAPILLARY     Status: Abnormal   Collection Time    01/14/14  6:23 AM      Result Value Ref Range   Glucose-Capillary 123 (*) 70 - 99 mg/dL  GLUCOSE, CAPILLARY     Status: Abnormal   Collection Time    01/14/14  7:26 AM      Result Value Ref Range   Glucose-Capillary 140 (*) 70 - 99 mg/dL   Comment 1 Notify RN    GLUCOSE, CAPILLARY     Status: Abnormal   Collection Time    01/14/14  9:56 AM      Result Value Ref Range   Glucose-Capillary 112 (*) 70 - 99 mg/dL   Comment 1 Notify RN    GLUCOSE, CAPILLARY      Status: Abnormal   Collection Time    01/14/14 11:41 AM      Result Value Ref Range   Glucose-Capillary 109 (*) 70 - 99 mg/dL   Comment 1 Notify RN    GLUCOSE, CAPILLARY     Status: Abnormal  Collection Time    01/14/14  2:27 PM      Result Value Ref Range   Glucose-Capillary 185 (*) 70 - 99 mg/dL   Comment 1 Notify RN    GLUCOSE, CAPILLARY     Status: Abnormal   Collection Time    01/14/14  4:07 PM      Result Value Ref Range   Glucose-Capillary 218 (*) 70 - 99 mg/dL   Comment 1 Notify RN    GLUCOSE, CAPILLARY     Status: Abnormal   Collection Time    01/14/14  6:25 PM      Result Value Ref Range   Glucose-Capillary 265 (*) 70 - 99 mg/dL   Comment 1 Notify RN    GLUCOSE, CAPILLARY     Status: Abnormal   Collection Time    01/14/14  8:01 PM      Result Value Ref Range   Glucose-Capillary 246 (*) 70 - 99 mg/dL   Comment 1 Notify RN    GLUCOSE, CAPILLARY     Status: Abnormal   Collection Time    01/14/14  9:57 PM      Result Value Ref Range   Glucose-Capillary 152 (*) 70 - 99 mg/dL   Comment 1 Notify RN    GLUCOSE, CAPILLARY     Status: Abnormal   Collection Time    01/15/14 12:29 AM      Result Value Ref Range   Glucose-Capillary 144 (*) 70 - 99 mg/dL   Comment 1 Notify RN    GLUCOSE, CAPILLARY     Status: Abnormal   Collection Time    01/15/14  2:32 AM      Result Value Ref Range   Glucose-Capillary 132 (*) 70 - 99 mg/dL   Comment 1 Notify RN    GLUCOSE, CAPILLARY     Status: None   Collection Time    01/15/14  4:31 AM      Result Value Ref Range   Glucose-Capillary 77  70 - 99 mg/dL  BASIC METABOLIC PANEL     Status: Abnormal   Collection Time    01/15/14  5:45 AM      Result Value Ref Range   Sodium 145  137 - 147 mEq/L   Potassium 4.5  3.7 - 5.3 mEq/L   Chloride 105  96 - 112 mEq/L   CO2 32  19 - 32 mEq/L   Glucose, Bld 81  70 - 99 mg/dL   BUN 36 (*) 6 - 23 mg/dL   Creatinine, Ser 1.31 (*) 0.50 - 1.10 mg/dL   Calcium 8.0 (*) 8.4 - 10.5 mg/dL   GFR  calc non Af Amer 41 (*) >90 mL/min   GFR calc Af Amer 47 (*) >90 mL/min   Comment: (NOTE)     The eGFR has been calculated using the CKD EPI equation.     This calculation has not been validated in all clinical situations.     eGFR's persistently <90 mL/min signify possible Chronic Kidney     Disease.   Anion gap 8  5 - 15  CBC     Status: Abnormal   Collection Time    01/15/14  5:45 AM      Result Value Ref Range   WBC 7.2  4.0 - 10.5 K/uL   RBC 3.72 (*) 3.87 - 5.11 MIL/uL   Hemoglobin 10.7 (*) 12.0 - 15.0 g/dL   HCT 32.1 (*) 36.0 - 46.0 %   MCV 86.3  78.0 - 100.0 fL   MCH 28.8  26.0 - 34.0 pg   MCHC 33.3  30.0 - 36.0 g/dL   RDW 14.4  11.5 - 15.5 %   Platelets 243  150 - 400 K/uL  GLUCOSE, CAPILLARY     Status: None   Collection Time    01/15/14  6:23 AM      Result Value Ref Range   Glucose-Capillary 92  70 - 99 mg/dL   Comment 1 Notify RN     Labs are reviewed and are pertinent for N/A  Current Facility-Administered Medications  Medication Dose Route Frequency Provider Last Rate Last Dose  . acetaminophen (TYLENOL) tablet 650 mg  650 mg Oral Q4H PRN Kathie Dike, MD      . albuterol (PROVENTIL) (2.5 MG/3ML) 0.083% nebulizer solution 2.5 mg  2.5 mg Nebulization Q2H PRN Kathie Dike, MD      . aspirin tablet 325 mg  325 mg Oral Daily Kathie Dike, MD   325 mg at 01/14/14 1016  . cholecalciferol (VITAMIN D) tablet 1,000 Units  1,000 Units Oral Daily Delora Fuel, MD   4,174 Units at 01/14/14 1017  . cilostazol (PLETAL) tablet 100 mg  100 mg Oral BID Delora Fuel, MD   081 mg at 01/14/14 2140  . dextrose (GLUTOSE) 40 % oral gel 37.5 g  1 Tube Oral PRN Kathie Dike, MD   37.5 g at 01/14/14 0411  . dextrose 5 %-0.9 % sodium chloride infusion   Intravenous Continuous Kathie Dike, MD 50 mL/hr at 01/15/14 0155    . furosemide (LASIX) tablet 20 mg  20 mg Oral Daily Kathie Dike, MD   20 mg at 01/14/14 1338  . gabapentin (NEURONTIN) capsule 600 mg  600 mg Oral TID Delora Fuel, MD   448 mg at 01/14/14 2141  . insulin aspart (novoLOG) injection 0-15 Units  0-15 Units Subcutaneous Q4H Kathie Dike, MD   2 Units at 01/15/14 0154  . insulin aspart protamine- aspart (NOVOLOG MIX 70/30) injection 40 Units  40 Units Subcutaneous Q supper Kathie Dike, MD   40 Units at 01/14/14 1839  . insulin aspart protamine- aspart (NOVOLOG MIX 70/30) injection 40 Units  40 Units Subcutaneous Once Kathie Dike, MD      . insulin aspart protamine- aspart (NOVOLOG MIX 70/30) injection 60 Units  60 Units Subcutaneous BID WC Kathie Dike, MD      . LORazepam (ATIVAN) injection 1 mg  1 mg Intravenous Q4H PRN Kathie Dike, MD      . metoprolol (LOPRESSOR) tablet 50 mg  50 mg Oral BID Delora Fuel, MD   50 mg at 18/56/31 1017  . rOPINIRole (REQUIP) tablet 2 mg  2 mg Oral QPM Delora Fuel, MD   2 mg at 49/70/26 1839  . simvastatin (ZOCOR) tablet 20 mg  20 mg Oral [id number] Delora Fuel, MD   20 mg at 85/02/77 1839  . spironolactone (ALDACTONE) tablet 25 mg  25 mg Oral Daily Delora Fuel, MD   25 mg at 41/28/78 1017    Psychiatric Specialty Exam:     Blood pressure 152/64, pulse 80, temperature 98.8 F (37.1 C), temperature source Oral, resp. rate 20, height _0  (1.778 m), weight 134.446 kg (296 lb 6.4 oz), last menstrual period 06/01/1983, SpO2 94.00%.Body mass index is 42.53 kg/(m^2).  General Appearance: Casual  Eye Contact::  Good  Speech:  Clear and Coherent  Volume:  Normal  Mood:  Euthymic  Affect:  Appropriate and Congruent  Thought Process:  Coherent and Goal Directed  Orientation:  Full (Time, Place, and Person)  Thought Content:  WDL  Suicidal Thoughts:  No  Homicidal Thoughts:  No  Memory:  Immediate;   Fair Recent;   Fair Remote;   Fair  Judgement:  Fair  Insight:  Fair  Psychomotor Activity:  Normal  Concentration:  Good  Recall:  Fair  Akathisia:  No  Handed:  Right  AIMS (if indicated):     Assets:  Communication Skills Desire for  Improvement Resilience  Sleep:      Treatment Plan Summary: See below  Disposition: Discharge home when medically cleared; Pt is clear from psychiatry standpoint.  Disposition Initial Assessment Completed for this Encounter: Yes Disposition of Patient: Other dispositions   Benjamine Mola, FNP-BC 01/14/2014 6:04 PM          Patient seen, evaluated and I agree with notes by Nurse Practitioner. Corena Pilgrim, MD

## 2014-01-15 NOTE — Plan of Care (Signed)
Problem: Discharge/Transitional Outcomes Goal: PCP appointment made and transportation plan in place Outcome: Not Applicable Date Met:  90/21/11 01/15/14 1413 Patient and husband state they will call to schedule follow-up appointment.

## 2014-01-15 NOTE — Plan of Care (Signed)
Problem: Discharge Progression Outcomes Goal: Complications resolved/controlled Outcome: Completed/Met Date Met:  01/15/14 01/15/14 1414 patient admitted with delirium, stroke education booklet provided on admission since patient being worked up for possible TIA/stroke. Pt verbalizes understanding of stroke symptoms, when to seek medical attention. Donavan Foil, RN

## 2014-01-15 NOTE — Discharge Summary (Signed)
Physician Discharge Summary  Mary [name] [id number] DOB: Feb 15, 1946 DOA: 01/12/2014  PCP: Delphina Cahill, MD  Admit date: 01/12/2014 Discharge date: 01/15/2014  Time spent: 40 minutes  Recommendations for Outpatient Follow-up:  1. Patient will follow up with primary care physician next 1-2 weeks  Discharge Diagnoses:  Principal Problem:   Acute encephalopathy Active Problems:   Diabetes mellitus with neuropathy   Acute respiratory failure   Restless leg syndrome   Hypertension   Hx of right BKA   Delirium   Diastolic CHF, acute on chronic     Discharge Condition: Improved  Diet recommendation: Low salt, low carb  Filed Weights   01/12/14 2332 01/13/14 1608 01/15/14 0100  Weight: 111.131 kg (245 lb) 129.729 kg (286 lb) 134.446 kg (296 lb 6.4 oz)    History of present illness and hospital course:  This patient initially presents to the hospital with complaints of headache. She was noted to have significant hyperglycemia as well as hypertension. She was treated in the emergency room with initial improvement in her symptoms. She subsequently became confused, delirious and was threatening her husband. Initial plans were for psychiatric evaluation, patient's mental status continued to decline. She became increasingly delirious and lethargic. She also became short of breath and was noted to be wheezing. Evaluation indicated increased BNP and possible or edema. She was admitted to the hospital for further evaluation. The patient was given intravenous Lasix which improved her symptoms. Echocardiogram showed a preserved ejection fraction. Her volume overload was likely related to aggressive IV hydration received in the emergency room. Regarding her delirium, it was felt this was likely related to polypharmacy. The patient was taking over-the-counter sleep agents which could have been contributing to her symptoms. She was seen by neurology who felt that she likely had a  metabolic/polypharmacy reason for her mental status changes. It was not felt that further workup is needed. The following day, the patient's mental status had dramatically improved she was back to her normal self. She was watched another 24 hours and remained completely stable. It was likely that we'll indication playing a role in her symptoms. The patient was continued on insulin for diabetes management. She was counseled on the importance of diet regarding management of blood sugars. Her insulin dosage was significantly reduced due to borderline/normal blood sugars. She relates further titration of her blood sugars as an outpatient. Patient is otherwise stable for discharge  Procedures: Echo:- Moderate LVH with LVEF 0000000, grade 1 diastolic dysfunction with increased filling pressures. Mild mitral regurgitation. Atrial septum bows from left to right, no obvious PFO or ASD. Upper normal PASP 36 mmHg. Trivial pericardial effusion.    Consultations:  Neurology  Discharge Exam: Filed Vitals:   01/15/14 0933  BP: 134/59  Pulse: 86  Temp: 98.2 F (36.8 C)  Resp: 20    General: No acute distress, awake, alert, answering questions appropriately Cardiovascular: S1, S2, regular rate and rhythm Respiratory: Clear to auscultation bilaterally  Discharge Instructions You were cared for by a hospitalist during your hospital stay. If you have any questions about your discharge medications or the care you received while you were in the hospital after you are discharged, you can call the unit and asked to speak with the hospitalist on call if the hospitalist that took care of you is not available. Once you are discharged, your primary care physician will handle any further medical issues. Please note that NO REFILLS for any discharge medications will be authorized once you are discharged,  as it is imperative that you return to your primary care physician (or establish a relationship with a primary care  physician if you do not have one) for your aftercare needs so that they can reassess your need for medications and monitor your lab values.  Discharge Instructions   Call MD for:    Complete by:  As directed   confusion     Diet - low sodium heart healthy    Complete by:  As directed      Diet Carb Modified    Complete by:  As directed      Face-to-face encounter (required for Medicare/Medicaid patients)    Complete by:  As directed   I MEMON,JEHANZEB certify that this patient is under my care and that I, or a nurse practitioner or physician's assistant working with me, had a face-to-face encounter that meets the physician face-to-face encounter requirements with this patient on 01/15/2014. The encounter with the patient was in whole, or in part for the following medical condition(s) which is the primary reason for home health care (List medical condition): admitted with uncontrolled diabetes. Needs home health RN to assess glucose management at home  The encounter with the patient was in whole, or in part, for the following medical condition, which is the primary reason for home health care:  acute delirium, uncontrolled diabetes  I certify that, based on my findings, the following services are medically necessary home health services:  Nursing  My clinical findings support the need for the above services:  Shortness of breath with activity  Further, I certify that my clinical findings support that this patient is homebound due to:  Unable to leave home safely without assistance  Reason for Medically Necessary Home Health Services:  Skilled Nursing- Skilled Assessment/Observation     Home Health    Complete by:  As directed   To provide the following care/treatments:  RN     Increase activity slowly    Complete by:  As directed             Medication List    STOP taking these medications       ibuprofen 400 MG tablet  Commonly known as:  ADVIL,MOTRIN     OVER THE COUNTER MEDICATION       TAKE these medications       aspirin EC 81 MG tablet  Take 1 tablet (81 mg total) by mouth daily.     cholecalciferol 1000 UNITS tablet  Commonly known as:  VITAMIN D  Take 1,000 Units by mouth daily.     cilostazol 100 MG tablet  Commonly known as:  PLETAL  Take 100 mg by mouth 2 (two) times daily.     cyclobenzaprine 5 MG tablet  Commonly known as:  FLEXERIL  Take 5 mg by mouth 2 (two) times daily as needed for muscle spasms.     DULoxetine 60 MG capsule  Commonly known as:  CYMBALTA  Take 60 mg by mouth at bedtime as needed (nerve pain).     gabapentin 300 MG capsule  Commonly known as:  NEURONTIN  Take 600 mg by mouth 3 (three) times daily.     insulin lispro protamine-lispro (50-50) 100 UNIT/ML Susp injection  Commonly known as:  HUMALOG 50/50 MIX  Inject 0.4-0.6 mLs (40-60 Units total) into the skin 3 (three) times daily with meals. 60 units in the morning, 60 units in the afternoon, and 40 units at bedtime.     IRON PO  Take 5 tablets by mouth daily.     Magnesium 100 MG Tabs  Take 100 mg by mouth daily.     metoprolol 50 MG tablet  Commonly known as:  LOPRESSOR  Take 50 mg by mouth 2 (two) times daily.     multivitamin with minerals Tabs tablet  Take 1 tablet by mouth once a week.     pravastatin 40 MG tablet  Commonly known as:  PRAVACHOL  Take 40 mg by mouth daily.     rOPINIRole 2 MG tablet  Commonly known as:  REQUIP  Take 2 mg by mouth every evening.     spironolactone 25 MG tablet  Commonly known as:  ALDACTONE  Take 25 mg by mouth daily.     traMADol 50 MG tablet  Commonly known as:  ULTRAM  Take 1 tablet (50 mg total) by mouth every 6 (six) hours as needed.       No Known Allergies     Follow-up Information   Follow up with [facility].   Contact information:   [address] [place] [address] [phone]       Follow up with Delphina Cahill, MD. Schedule an appointment as soon as possible for a  visit in 2 weeks.   Specialty:  Internal Medicine   Contact information:    [place] [address] [phone]        The results of significant diagnostics from this hospitalization (including imaging, microbiology, ancillary and laboratory) are listed below for reference.    Significant Diagnostic Studies: Dg Chest 2 View  01/13/2014   CLINICAL DATA:  Short of breath.  EXAM: CHEST  2 VIEW  COMPARISON:  06/10/2013  FINDINGS: Exam limited by low lung volumes and supine patient positioning as well as the patient's body habitus.  Cardiac silhouette is mildly enlarged. No mediastinal or hilar masses. Mildly prominent interstitial markings. No lung consolidation. No convincing edema.  Bony thorax is grossly intact.  IMPRESSION: Limited exam.  No acute cardiopulmonary disease.   Electronically Signed   By: Lajean Manes M.D.   On: 01/13/2014 09:13   Ct Head Wo Contrast  01/13/2014   CLINICAL DATA:  Headache.  Hyperglycemia.  Altered mental status.  EXAM: CT HEAD WITHOUT CONTRAST  TECHNIQUE: Contiguous axial images were obtained from the base of the skull through the vertex without intravenous contrast.  COMPARISON:  CT of the head performed 12/05/2013  FINDINGS: There is no evidence of acute infarction, mass lesion, or intra- or extra-axial hemorrhage on CT. Evaluation is suboptimal due to motion artifact.  Mild periventricular white matter change may reflect small vessel ischemic microangiopathy. Mild chronic ischemic change is noted at the anterior limb of the right internal capsule, unchanged from prior studies.  The posterior fossa, including the cerebellum, brainstem and fourth ventricle, is within normal limits. The third and lateral ventricles are unremarkable in appearance. The cerebral hemispheres are symmetric in appearance, with normal gray-white differentiation. No mass effect or midline shift is seen.  There is no evidence of fracture; visualized osseous structures are  unremarkable in appearance. The visualized portions of the orbits are within normal limits. The paranasal sinuses and mastoid air cells are well-aerated. No significant soft tissue abnormalities are seen.  IMPRESSION: 1. No acute intracranial pathology seen on CT. 2. Mild small vessel ischemic microangiopathy, with mild chronic ischemic change at the anterior limb of the right internal capsule.   Electronically Signed   By:  Garald Balding M.D.   On: 01/13/2014 04:34   Mr [name] Wo Contrast  01/14/2014   CLINICAL DATA:  68 year old female with weakness, headache, possible expressive aphasia. Initial encounter.  EXAM: MRI HEAD WITHOUT CONTRAST  MRA HEAD WITHOUT CONTRAST  TECHNIQUE: Multiplanar, multiecho pulse sequences of the brain and surrounding structures were obtained without intravenous contrast. Angiographic images of the head were obtained using MRA technique without contrast.  COMPARISON:  Head CT without contrast 01/13/2014. Brain MRI and MRA 03/16/2013.  FINDINGS: MRI HEAD FINDINGS  Mildly degraded by motion artifact despite repeated imaging attempts. Stable cerebral volume. Major intracranial vascular flow voids are stable. No restricted diffusion to suggest acute infarction. No midline shift, mass effect, evidence of mass lesion, ventriculomegaly, extra-axial collection or acute intracranial hemorrhage. Cervicomedullary junction and pituitary are within normal limits. Negative visualized cervical spine.  Nonspecific periventricular and scattered cerebral white matter sometimes Patchy and confluent T2 and FLAIR hyperintensity, not significantly changed since 2014. No superimposed cortical encephalomalacia identified. Deep gray matter nuclei appear stable and within normal limits for age. Brainstem and cerebellum appear stable and within normal limits.  Visible internal auditory structures appear normal. Bilateral mastoid effusions have resolved. Minimal paranasal sinus mucosal thickening. Interval  postoperative changes to both globes. Visualized scalp soft tissues are within normal limits. Normal bone marrow signal.  MRA HEAD FINDINGS  Mildly degraded by motion artifact despite repeated imaging attempts.  Stable antegrade flow in the posterior circulation with relatively codominant distal vertebral arteries, but the right functionally terminating in PICA. Stable and normal PICA origins. Stable basilar artery patency without stenosis. Fetal type left PCA origin re- identified. Stable posterior communicating arteries. Bilateral PCA branches are stable.  Stable antegrade flow in both ICA siphons. Stable signal loss in the left ICA siphon anterior genu suggesting hemodynamically significant stenosis. Both ICA termini remain patent, with diminutive or absent left ACA A1 segment as before. Anterior communicating artery and visualized bilateral ACA branches remain within normal limits. Both MCA origins remain normal. Visualized bilateral MCA branches are stable and within normal limits.  IMPRESSION: 1. No acute intracranial abnormality. Stable non contrast MRI appearance of the brain since 2014. 2. Stable intracranial MRA, negative except for chronic hemodynamically significant stenosis at the left ICA siphon anterior genu. 3. Interval resolved mastoid effusions.   Electronically Signed   By: Lars Pinks M.D.   On: 01/14/2014 11:36   Mri Brain Without Contrast  01/14/2014   CLINICAL DATA:  68 year old female with weakness, headache, possible expressive aphasia. Initial encounter.  EXAM: MRI HEAD WITHOUT CONTRAST  MRA HEAD WITHOUT CONTRAST  TECHNIQUE: Multiplanar, multiecho pulse sequences of the brain and surrounding structures were obtained without intravenous contrast. Angiographic images of the head were obtained using MRA technique without contrast.  COMPARISON:  Head CT without contrast 01/13/2014. Brain MRI and MRA 03/16/2013.  FINDINGS: MRI HEAD FINDINGS  Mildly degraded by motion artifact despite repeated  imaging attempts. Stable cerebral volume. Major intracranial vascular flow voids are stable. No restricted diffusion to suggest acute infarction. No midline shift, mass effect, evidence of mass lesion, ventriculomegaly, extra-axial collection or acute intracranial hemorrhage. Cervicomedullary junction and pituitary are within normal limits. Negative visualized cervical spine.  Nonspecific periventricular and scattered cerebral white matter sometimes Patchy and confluent T2 and FLAIR hyperintensity, not significantly changed since 2014. No superimposed cortical encephalomalacia identified. Deep gray matter nuclei appear stable and within normal limits for age. Brainstem and cerebellum appear stable and within normal limits.  Visible internal auditory structures  appear normal. Bilateral mastoid effusions have resolved. Minimal paranasal sinus mucosal thickening. Interval postoperative changes to both globes. Visualized scalp soft tissues are within normal limits. Normal bone marrow signal.  MRA HEAD FINDINGS  Mildly degraded by motion artifact despite repeated imaging attempts.  Stable antegrade flow in the posterior circulation with relatively codominant distal vertebral arteries, but the right functionally terminating in PICA. Stable and normal PICA origins. Stable basilar artery patency without stenosis. Fetal type left PCA origin re- identified. Stable posterior communicating arteries. Bilateral PCA branches are stable.  Stable antegrade flow in both ICA siphons. Stable signal loss in the left ICA siphon anterior genu suggesting hemodynamically significant stenosis. Both ICA termini remain patent, with diminutive or absent left ACA A1 segment as before. Anterior communicating artery and visualized bilateral ACA branches remain within normal limits. Both MCA origins remain normal. Visualized bilateral MCA branches are stable and within normal limits.  IMPRESSION: 1. No acute intracranial abnormality. Stable non  contrast MRI appearance of the brain since 2014. 2. Stable intracranial MRA, negative except for chronic hemodynamically significant stenosis at the left ICA siphon anterior genu. 3. Interval resolved mastoid effusions.   Electronically Signed   By: Lars Pinks M.D.   On: 01/14/2014 11:36   US Carotid Duplex Bilateral  01/14/2014   CLINICAL DATA:  TIA  EXAM: BILATERAL CAROTID DUPLEX ULTRASOUND  TECHNIQUE: Pearline Cables scale imaging, color Doppler and duplex ultrasound was performed of bilateral carotid and vertebral arteries in the neck.  COMPARISON:  03/16/2013  REVIEW OF SYSTEMS: Quantification of carotid stenosis is based on velocity parameters that correlate the residual internal carotid diameter with NASCET-based stenosis levels, using the diameter of the distal internal carotid lumen as the denominator for stenosis measurement.  The following velocity measurements were obtained:  PEAK SYSTOLIC/END DIASTOLIC  RIGHT  ICA:                     101/31cm/sec  CCA:                     [id number]  SYSTOLIC ICA/CCA RATIO:  [id number]  DIASTOLIC ICA/CCA RATIO: [id number]  ECA:                     77cm/sec  LEFT  ICA:                     99/24cm/sec  CCA:                     [id number]  SYSTOLIC ICA/CCA RATIO:  [id number]  DIASTOLIC ICA/CCA RATIO: [id number]  ECA:                     56cm/sec  FINDINGS: RIGHT CAROTID ARTERY: Scattered eccentric nonocclusive plaque in the common carotid artery. Circumferential partially calcified plaque in the carotid bulb extending into proximal internal and external carotid arteries without high-grade stenosis. High bifurcation. Normal waveforms and color Doppler signal.  RIGHT VERTEBRAL ARTERY:  Normal flow direction and waveform.  LEFT CAROTID ARTERY: Thyroid lesion incidentally noted. Mild plaque in the carotid bulb and in the proximal ICA without high-grade stenosis. High bifurcation. Normal waveforms and color Doppler signal.  LEFT VERTEBRAL ARTERY: Normal flow direction and waveform.  IMPRESSION: 1. Mild  bilateral carotid bifurcation and proximal ICA plaque resulting in less than 50% diameter stenosis. The exam does not exclude plaque ulceration or embolization. Continued surveillance recommended. 2. Left thyroid lesion, incompletely characterized. If  clinically relevant, thyroid ultrasound may be useful for further assessment.   Electronically Signed   By: Arne Cleveland M.D.   On: 01/14/2014 10:53    Microbiology: No results found for this or any previous visit (from the past 240 hour(s)).   Labs: Basic Metabolic Panel:  Recent Labs Lab 01/12/14 2359 01/14/14 0548 01/15/14 0545  NA 134* 143 145  K 4.6 3.9 4.5  CL 97 100 105  CO2 25 28 32  GLUCOSE 415* 118* 81  BUN 28* 29* 36*  CREATININE 1.01 1.17* 1.31*  CALCIUM 8.3* 8.2* 8.0*   Liver Function Tests: No results found for this basename: AST, ALT, ALKPHOS, BILITOT, PROT, ALBUMIN,  in the last 168 hours No results found for this basename: LIPASE, AMYLASE,  in the last 168 hours  Recent Labs Lab 01/13/14 1640  AMMONIA 38   CBC:  Recent Labs Lab 01/12/14 2359 01/15/14 0545  WBC 10.4 7.2  NEUTROABS 8.2*  --   HGB 11.2* 10.7*  HCT 33.5* 32.1*  MCV 84.0 86.3  PLT 269 243   Cardiac Enzymes:  Recent Labs Lab 01/13/14 1010  TROPONINI <0.30   BNP: BNP (last 3 results)  Recent Labs  06/10/13 0630 01/13/14 1010  PROBNP 1779.0* 4362.0*   CBG:  Recent Labs Lab 01/15/14 0431 01/15/14 0623 01/15/14 0751 01/15/14 1034 01/15/14 1130  GLUCAP 77 92 100* 237* 173*       Signed:  MEMON,JEHANZEB  [facility] 01/15/2014, 9:07 PM

## 2014-01-15 NOTE — Progress Notes (Signed)
01/15/14 1310 CBG: 173 before lunch. Ate lunch, tolerated well. Orders for sliding scale insulin coverage, 3 units novolog and novolog 70/30 60 units. Notified Dr. Roderic Palau. Stated do not give sliding scale insulin, change novolog 70/30 to 40 units SQ now since she ate lunch. See MAR. Donavan Foil, RN

## 2014-01-15 NOTE — Progress Notes (Signed)
01/15/14 1417 Reviewed discharge instructions with patient, husband at bedside. Given copy of instructions, f/u appointment information. Discussed discharge instructions for altered mental status, when to seek medical attention. Noted when medications next due on list. Verbalized understanding. Pt and husband verbalize s/s stroke, when to seek medical attention. IV site d/c'd within normal limits. Home health arranged per case management. Pt aware, contact information provided on AVS. Pt left floor in stable condition via w/c accompanied by nurse tech. Donavan Foil, RN

## 2014-04-01 ENCOUNTER — Encounter (HOSPITAL_COMMUNITY): Payer: Self-pay | Admitting: *Deleted

## 2014-05-02 ENCOUNTER — Ambulatory Visit (HOSPITAL_COMMUNITY)
Admission: RE | Admit: 2014-05-02 | Discharge: 2014-05-02 | Disposition: A | Discharge status: Home / Self Care | Payer: Medicare HMO | Source: Ambulatory Visit | Attending: Internal Medicine | Admitting: Internal Medicine

## 2014-05-02 DIAGNOSIS — S91001A Unspecified open wound, right ankle, initial encounter: Secondary | ICD-10-CM

## 2014-05-02 DIAGNOSIS — S81801A Unspecified open wound, right lower leg, initial encounter: Secondary | ICD-10-CM

## 2014-05-02 DIAGNOSIS — S81001A Unspecified open wound, right knee, initial encounter: Secondary | ICD-10-CM | POA: Insufficient documentation

## 2014-05-02 DIAGNOSIS — Z89511 Acquired absence of right leg below knee: Secondary | ICD-10-CM | POA: Insufficient documentation

## 2014-05-02 DIAGNOSIS — E119 Type 2 diabetes mellitus without complications: Secondary | ICD-10-CM | POA: Insufficient documentation

## 2014-05-02 NOTE — Therapy (Signed)
[facility] [address] [place], [place], [address] Phone: [phone]   Fax:  [phone]  Physical Therapy Evaluation  Patient Details  Name: Mary [name] MRN: [id number] Date of Birth: 04-Oct-1945  Encounter Date: 05/02/2014      PT End of Session - 05/02/14 1227    Visit Number 1   Number of Visits 8   Date for PT Re-Evaluation 06/01/14   Authorization Type [employer] Medicare   Authorization - Visit Number 1   Authorization - Number of Visits 8   PT Start Time 1100   PT Stop Time 1130   PT Time Calculation (min) 30 min   Activity Tolerance Patient tolerated treatment well   Behavior During Therapy [facility] for tasks assessed/performed      Past Medical History  Diagnosis Date  . CHF (congestive heart failure)   . Diabetes mellitus   . Restless leg syndrome 07/25/2011  . Wound, open     right foot  . Diabetes mellitus with neuropathy 07/25/2011  . Cellulitis and abscess of foot 02/12/2012  . Osteomyelitis of right foot 02/14/2012  . Partial Achilles tendon tear 02/14/2012  . Dysrhythmia     tachy- takes Metoprolol  . OSA (obstructive sleep apnea) 07/25/2011    not using CPAP  . Arthritis     knees  . Anemia   . Complication of anesthesia     pt states after her hysterectomy the doctor said she was "wild" when she woke up     Past Surgical History  Procedure Laterality Date  . Abdominal hysterectomy    . Achilles tendon repair    . Toe amputation      partial rt great toe  . Below knee leg amputation Right 11/25/2012    Dr Sharol Given  . Amputation Right 11/25/2012    Procedure: AMPUTATION BELOW KNEE;  Surgeon: Newt Minion, MD;  Location: [place];  Service: Orthopedics;  Laterality: Right;  Right Below Knee Amputation  . Breast surgery Left     Lumpectomy non ca  . Cataract extraction w/phaco Right 10/09/2013    Procedure: CATARACT EXTRACTION PHACO AND INTRAOCULAR LENS PLACEMENT (IOC);  Surgeon: Elta Guadeloupe T. Gershon Crane, MD;  Location: [facility];   Service: Ophthalmology;  Laterality: Right;  CDE:9.05  . Cataract extraction w/phaco Left 10/23/2013    Procedure: ATTEMPTED CATARACT EXTRACTION PHACO AND INTRAOCULAR LENS PLACEMENT;  Surgeon: Elta Guadeloupe T. Gershon Crane, MD;  Location: [facility];  Service: Ophthalmology;  Laterality: Left;  CDE:  4.41    LMP 06/01/1983  Visit Diagnosis:  Open wound of knee, leg (except thigh), and ankle, complicated, right, initial encounter      Subjective Assessment - 05/02/14 1216    Symptoms Patient states she isunsure of when the wound began, notes that her husband first noticed wit about 2 weeks ago. and they have been bandaging it them selves. She notes when she first thoguht somethign may be wrong she hasd sweated a lot and her prostetic sock was full of water. States No pain.   Pertinent History Rt residual limb wound on below knee amputation.    Limitations Standing   How long can you stand comfortably? <4minutes          [facility] PT Assessment - 05/02/14 0001    Assessment   Medical Diagnosis Patient states she isunsure of when the wound began, notes that her husband first noticed wit about 2 weeks ago. and they have been bandaging it them selves. She notes when she  first thoguht somethign may be wrong she hasd sweated a lot and her prostetic sock was full of water. States No pain.   Onset Date 2014-05-18           G-Codes - Jun 01, 2014 09-22-27    Functional Assessment Tool Used Clinical judgement   Functional Limitation Other PT primary   Other PT Primary Current Status [phone]) At least 40 percent but less than 60 percent impaired, limited or restricted   Other PT Primary Goal Status [id number]) At least 20 percent but less than 40 percent impaired, limited or restricted          Wound Therapy - Jun 01, 2014 1231    Subjective Patient states she isunsure of when the wound began, notes that her husband first noticed wit about 2 weeks ago. and they have been bandaging it them selves. She notes when she first  thoguht somethign may be wrong she hasd sweated a lot and her prostetic sock was full of water. States No pain.   Patient and Family Stated Goals to heal wound   Date of Onset May 18, 2014   Prior Treatments self treatment only   Evaluation and Treatment Procedures Explained to Patient/Family Yes   Evaluation and Treatment Procedures agreed to   Wound Properties Date First Assessed: 06-01-14 Time First Assessed: 1114 Wound Type: Other (Comment) , maceration due to wet environement.  Location: Knee Location Orientation: Right;Distal Wound Description (Comments): wound is on distal end of residual limb. Looks macerated secondary to excessively wet enironment.  Present on Admission: Yes   Dressing Type Gauze (Comment)  coband, emphasis on dry wound environemnt.    Dressing Status Clean   Dressing Change Frequency Every 3 days   Site / Wound Assessment Clean;Friable;Pink   % Wound base Red or Granulating 50%   % Wound base Yellow 0%   % Wound base Black 0%   % Wound base Other (Comment) 50%  white   Peri-wound Assessment Maceration   Margins Unattached edges (unapproximated)   Closure None   Drainage Amount Moderate  water   Drainage Description Serous  water/sweat/blood   Selective Debridement - Location Rt distal knee   Selective Debridement - Tools Used Scalpel;Forceps   Selective Debridement - Tissue Removed dead skin, and macerated tissue   Wound Therapy - Clinical Statement Patient displays mwet macerated skin attributed to prolonged wear and sweatign with use of prosthetic. Patient states she has a new prosthetic but can wear it because it turns around backwards. Patient will benefit from skilled phsyical therapy to improve wound healuing environment to a drier environment to decrease maceration/fraying of skin. Good blood flow noted following debridment   Wound Therapy - Functional Problem List difficulty walking   Factors Delaying/Impairing Wound Healing --  difficulty view wound area  by patient.    Wound Therapy - Frequency --  2x a week   Wound Plan Increase dryness of wound enviroenment and wrap as needed to maintain proper wound healing environment.    Decrease Necrotic Tissue to 0%   Increase Granulation Tissue to 100%   Decrease Length/Width/Depth by (cm) 2cm x 2cm   Improve Drainage Characteristics Min   Patient/Family will be able to  change dressing independently   Time For Goal Achievement 2 weeks   Wound Therapy - Potential for Goals Good         Problem List Patient Active Problem List   Diagnosis Date Noted  . Delirium 01/13/2014  . Diastolic CHF, acute on chronic 01/13/2014  .  Expressive aphasia 01/13/2014  . Acute encephalopathy 03/16/2013  . Acute on chronic renal failure 03/16/2013  . Hypoglycemia 03/16/2013  . CHF (congestive heart failure)   . Unstable balance 01/16/2013  . Difficulty in walking(719.7) 01/16/2013  . Infected wound 01/16/2013  . Hx of right BKA 12/23/2012  . Candidiasis of breast 12/08/2012  . S/P bilateral BKA (below knee amputation) 11/30/2012  . Diabetes 11/30/2012  . Diabetic foot ulcer 10/08/2012  . UTI (lower urinary tract infection) 10/07/2012  . Bacteremia due to Staphylococcus aureus 10/07/2012  . Anxiety 02/17/2012  . Osteomyelitis of right foot 02/14/2012  . Hematuria, microscopic 02/13/2012  . Hyponatremia 02/13/2012  . PVD (peripheral vascular disease) 02/08/2012  . Open wound of heel 02/08/2012  . Hyperlipidemia 11/11/2011  . Hypertension 11/11/2011  . Morbid obesity 11/11/2011  . Tachycardia 08/04/2011  . Acute exacerbation of CHF (congestive heart failure) 07/25/2011  . Diabetes mellitus with neuropathy 07/25/2011  . OSA (obstructive sleep apnea) 07/25/2011  . Acute bronchitis 07/25/2011  . Generalized weakness 07/25/2011  . Acute respiratory failure 07/25/2011  . Restless leg syndrome 07/25/2011  . Anemia 07/25/2011   Devona Konig PT DPT [phone]

## 2014-05-06 ENCOUNTER — Ambulatory Visit (HOSPITAL_COMMUNITY)
Admission: RE | Admit: 2014-05-06 | Discharge: 2014-05-06 | Disposition: A | Discharge status: Home / Self Care | Payer: Medicare HMO | Source: Ambulatory Visit | Attending: Internal Medicine | Admitting: Internal Medicine

## 2014-05-06 DIAGNOSIS — T798XXS Other early complications of trauma, sequela: Secondary | ICD-10-CM

## 2014-05-06 DIAGNOSIS — S91001A Unspecified open wound, right ankle, initial encounter: Principal | ICD-10-CM

## 2014-05-06 DIAGNOSIS — S81801A Unspecified open wound, right lower leg, initial encounter: Principal | ICD-10-CM

## 2014-05-06 DIAGNOSIS — R2689 Other abnormalities of gait and mobility: Secondary | ICD-10-CM

## 2014-05-06 DIAGNOSIS — S81001A Unspecified open wound, right knee, initial encounter: Secondary | ICD-10-CM | POA: Diagnosis not present

## 2014-05-06 NOTE — Therapy (Signed)
[facility] [address] [place], [place], [address] Phone: [phone]   Fax:  [phone]  Wound Care Therapy  Patient Details  Name: Mary [name] MRN: [id number] Date of Birth: 27-Feb-1946  Encounter Date: 05/06/2014      PT End of Session - 05/06/14 1644    Visit Number 2   Number of Visits 8   Date for PT Re-Evaluation 06/01/14   Authorization Type [employer] Medicare   Authorization - Visit Number 2   Authorization - Number of Visits 8   PT Start Time [id number]   PT Stop Time 1455   PT Time Calculation (min) 21 min   Activity Tolerance Patient tolerated treatment well   Behavior During Therapy [facility] for tasks assessed/performed      Past Medical History  Diagnosis Date  . CHF (congestive heart failure)   . Diabetes mellitus   . Restless leg syndrome 07/25/2011  . Wound, open     right foot  . Diabetes mellitus with neuropathy 07/25/2011  . Cellulitis and abscess of foot 02/12/2012  . Osteomyelitis of right foot 02/14/2012  . Partial Achilles tendon tear 02/14/2012  . Dysrhythmia     tachy- takes Metoprolol  . OSA (obstructive sleep apnea) 07/25/2011    not using CPAP  . Arthritis     knees  . Anemia   . Complication of anesthesia     pt states after her hysterectomy the doctor said she was "wild" when she woke up     Past Surgical History  Procedure Laterality Date  . Abdominal hysterectomy    . Achilles tendon repair    . Toe amputation      partial rt great toe  . Below knee leg amputation Right 11/25/2012    Dr Sharol Given  . Amputation Right 11/25/2012    Procedure: AMPUTATION BELOW KNEE;  Surgeon: Newt Minion, MD;  Location: [place];  Service: Orthopedics;  Laterality: Right;  Right Below Knee Amputation  . Breast surgery Left     Lumpectomy non ca  . Cataract extraction w/phaco Right 10/09/2013    Procedure: CATARACT EXTRACTION PHACO AND INTRAOCULAR LENS PLACEMENT (IOC);  Surgeon: Elta Guadeloupe T. Gershon Crane, MD;  Location: [facility];  Service:  Ophthalmology;  Laterality: Right;  CDE:9.05  . Cataract extraction w/phaco Left 10/23/2013    Procedure: ATTEMPTED CATARACT EXTRACTION PHACO AND INTRAOCULAR LENS PLACEMENT;  Surgeon: Elta Guadeloupe T. Gershon Crane, MD;  Location: [facility];  Service: Ophthalmology;  Laterality: Left;  CDE:  4.41    LMP 06/01/1983  Visit Diagnosis:  Open wound of knee, leg (except thigh), and ankle, complicated, right, initial encounter  Infected wound, sequela  Unstable balance         Wound Therapy - 05/06/14 1639    Subjective Pt reports her husband is changing her bandages every other day.  States she has no pain and feels like it is getting better.   Patient and Family Stated Goals to heal wound   Date of Onset 04/18/14   Prior Treatments self treatment only   Evaluation and Treatment Procedures Explained to Patient/Family Yes   Evaluation and Treatment Procedures agreed to   Wound Properties Date First Assessed: 05/02/14 Time First Assessed: 1114 Wound Type: Other (Comment) , maceration due to wet environement.  Location: Knee Location Orientation: Right;Distal Wound Description (Comments): wound is on distal end of residual limb. Looks macerated secondary to excessively wet enironment.  Present on Admission: Yes   Dressing Type Gauze (Comment)  dry gauze  and medipore    Dressing Changed Changed   Dressing Status Clean   Dressing Change Frequency Every 3 days   Site / Wound Assessment Clean;Friable;Pink   % Wound base Red or Granulating 75%   % Wound base Yellow 0%   % Wound base Black 0%   % Wound base Other (Comment) 25%  white   Peri-wound Assessment Intact   Margins Attached edges (approximated)   Closure None   Drainage Amount Minimal   Drainage Description Serous  water/sweat/blood   Selective Debridement - Location Rt distal knee   Selective Debridement - Tools Used Other (comment)  gauze   Selective Debridement - Tissue Removed dead skin   Wound Therapy - Clinical Statement Overall  improvement as compared to initial visit.  Recommended patient contract prosthetist as prosthesis is not fitting correct.  Currently wearing 3 socks but still loose around residual limb.  Decreased visits to 1X week as wound requires minimal debridement.  Pt and spouse are taking good care of wound.     Wound Therapy - Functional Problem List difficulty walking   Factors Delaying/Impairing Wound Healing --  difficulty view wound area by patient.    Wound Therapy - Frequency --  1x a week   Wound Plan Increase dryness of wound environment and wrap as needed to maintain proper wound healing environment.    Decrease Necrotic Tissue to 0%   Decrease Necrotic Tissue - Progress Progressing toward goal   Increase Granulation Tissue to 100%   Increase Granulation Tissue - Progress Progressing toward goal   Decrease Length/Width/Depth by (cm) 2cm x 2cm   Decrease Length/Width/Depth - Progress Progressing toward goal   Improve Drainage Characteristics Min   Improve Drainage Characteristics - Progress Progressing toward goal   Patient/Family will be able to  change dressing independently   Patient/Family Instruction Goal - Progress Progressing toward goal   Time For Goal Achievement 2 weeks   Wound Therapy - Potential for Goals Good                                       Problem List Patient Active Problem List   Diagnosis Date Noted  . Delirium 01/13/2014  . Diastolic CHF, acute on chronic 01/13/2014  . Expressive aphasia 01/13/2014  . Acute encephalopathy 03/16/2013  . Acute on chronic renal failure 03/16/2013  . Hypoglycemia 03/16/2013  . CHF (congestive heart failure)   . Unstable balance 01/16/2013  . Difficulty in walking(719.7) 01/16/2013  . Infected wound 01/16/2013  . Hx of right BKA 12/23/2012  . Candidiasis of breast 12/08/2012  . S/P bilateral BKA (below knee amputation) 11/30/2012  . Diabetes 11/30/2012  . Diabetic foot ulcer 10/08/2012  . UTI  (lower urinary tract infection) 10/07/2012  . Bacteremia due to Staphylococcus aureus 10/07/2012  . Anxiety 02/17/2012  . Osteomyelitis of right foot 02/14/2012  . Hematuria, microscopic 02/13/2012  . Hyponatremia 02/13/2012  . PVD (peripheral vascular disease) 02/08/2012  . Open wound of heel 02/08/2012  . Hyperlipidemia 11/11/2011  . Hypertension 11/11/2011  . Morbid obesity 11/11/2011  . Tachycardia 08/04/2011  . Acute exacerbation of CHF (congestive heart failure) 07/25/2011  . Diabetes mellitus with neuropathy 07/25/2011  . OSA (obstructive sleep apnea) 07/25/2011  . Acute bronchitis 07/25/2011  . Generalized weakness 07/25/2011  . Acute respiratory failure 07/25/2011  . Restless leg syndrome 07/25/2011  . Anemia 07/25/2011    Tiran Sauseda B  Mare Ferrari, PTA/CLT [phone] 05/06/2014, 4:46 PM

## 2014-05-08 ENCOUNTER — Encounter (HOSPITAL_COMMUNITY): Payer: Self-pay | Admitting: Physical Therapy

## 2014-05-10 ENCOUNTER — Ambulatory Visit (HOSPITAL_COMMUNITY): Payer: Self-pay | Admitting: Physical Therapy

## 2014-05-13 ENCOUNTER — Ambulatory Visit (HOSPITAL_COMMUNITY)
Admission: RE | Admit: 2014-05-13 | Discharge: 2014-05-13 | Disposition: A | Discharge status: Home / Self Care | Payer: Medicare HMO | Source: Ambulatory Visit | Attending: Internal Medicine | Admitting: Internal Medicine

## 2014-05-13 DIAGNOSIS — T798XXS Other early complications of trauma, sequela: Secondary | ICD-10-CM

## 2014-05-13 DIAGNOSIS — T798XXD Other early complications of trauma, subsequent encounter: Secondary | ICD-10-CM

## 2014-05-13 DIAGNOSIS — R2689 Other abnormalities of gait and mobility: Secondary | ICD-10-CM

## 2014-05-13 DIAGNOSIS — S81001A Unspecified open wound, right knee, initial encounter: Secondary | ICD-10-CM

## 2014-05-13 DIAGNOSIS — S81801A Unspecified open wound, right lower leg, initial encounter: Principal | ICD-10-CM

## 2014-05-13 DIAGNOSIS — S91001A Unspecified open wound, right ankle, initial encounter: Principal | ICD-10-CM

## 2014-05-13 NOTE — Therapy (Signed)
[facility] [place], [place], [address] Phone: [phone]   Fax:  [phone]  Wound Care Therapy  Patient Details  Name: Mary [name] MRN: [id number] Date of Birth: 1945-11-23  Encounter Date: 05/13/2014      PT End of Session - 05/13/14 1740    Visit Number 3   Number of Visits 8   Date for PT Re-Evaluation 06/01/14   Authorization Type [employer] Medicare   Authorization - Visit Number 3   Authorization - Number of Visits 8   PT Start Time 1150   PT Stop Time 1230   PT Time Calculation (min) 40 min   Activity Tolerance Patient tolerated treatment well   Behavior During Therapy [facility] for tasks assessed/performed      Past Medical History  Diagnosis Date  . CHF (congestive heart failure)   . Diabetes mellitus   . Restless leg syndrome 07/25/2011  . Wound, open     right foot  . Diabetes mellitus with neuropathy 07/25/2011  . Cellulitis and abscess of foot 02/12/2012  . Osteomyelitis of right foot 02/14/2012  . Partial Achilles tendon tear 02/14/2012  . Dysrhythmia     tachy- takes Metoprolol  . OSA (obstructive sleep apnea) 07/25/2011    not using CPAP  . Arthritis     knees  . Anemia   . Complication of anesthesia     pt states after her hysterectomy the doctor said she was "wild" when she woke up     Past Surgical History  Procedure Laterality Date  . Abdominal hysterectomy    . Achilles tendon repair    . Toe amputation      partial rt great toe  . Below knee leg amputation Right 11/25/2012    Dr Sharol Given  . Amputation Right 11/25/2012    Procedure: AMPUTATION BELOW KNEE;  Surgeon: Newt Minion, MD;  Location: [place];  Service: Orthopedics;  Laterality: Right;  Right Below Knee Amputation  . Breast surgery Left     Lumpectomy non ca  . Cataract extraction w/phaco Right 10/09/2013    Procedure: CATARACT EXTRACTION PHACO AND INTRAOCULAR LENS PLACEMENT (IOC);  Surgeon: Elta Guadeloupe T. Gershon Crane, MD;  [facility];  Service:  Ophthalmology;  Laterality: Right;  CDE:9.05  . Cataract extraction w/phaco Left 10/23/2013    Procedure: ATTEMPTED CATARACT EXTRACTION PHACO AND INTRAOCULAR LENS PLACEMENT;  Surgeon: Elta Guadeloupe T. Gershon Crane, MD;  Location: [facility];  Service: Ophthalmology;  Laterality: Left;  CDE:  4.41    LMP 06/01/1983  Visit Diagnosis:  Open wound of knee, leg (except thigh), and ankle, complicated, right, initial encounter  Infected wound, sequela  Unstable balance  Infected wound, subsequent encounter         Wound Therapy - 05/13/14 1736    Subjective Pt states she has not touched the bandage since last visit.  States she has 3 large "boils" that appeared in her inferior groin region on her Rt upper thigh.   Patient and Family Stated Goals to heal wound   Date of Onset 04/18/14   Prior Treatments self treatment only   Evaluation and Treatment Procedures Explained to Patient/Family Yes   Evaluation and Treatment Procedures agreed to   Wound Properties Date First Assessed: 05/02/14 Time First Assessed: 1114 Wound Type: Other (Comment) , maceration due to wet environement.  Location: Knee Location Orientation: Right;Distal Wound Description (Comments): wound is on distal end of residual limb. Looks macerated secondary to excessively wet enironment.  Present on  Admission: Yes   Dressing Type Gauze (Comment);Silver hydrofiber  dry gauze and medipore    Dressing Changed New   Dressing Status Clean   Dressing Change Frequency Every 3 days   Site / Wound Assessment Clean;Friable;Pink   % Wound base Red or Granulating 75%   % Wound base Yellow 0%   % Wound base Black 0%   % Wound base Other (Comment) 25%  white   Peri-wound Assessment Intact;Maceration   Margins Attached edges (approximated)   Closure None   Drainage Amount Minimal   Drainage Description Serous  water/sweat/blood   Treatment Cleansed;Debridement (Selective)   Selective Debridement - Location Rt distal knee   Selective Debridement -  Tools Used Other (comment)  gauze   Selective Debridement - Tissue Removed dead skin   Wound Therapy - Clinical Statement Wound with increased maceration today, however continues to heal.  Debrided dry skin from perimeter.  Added silver hydrofiber to additionallly absorb the moisture from the wound.  Instructed patient to return to Dr. [place] Crane regarding new areas and get further instructions for wound care versus lancing in office.  Pt and spouse verbalized understanding.  Placed gauze and secured with medipore over the three areas to prevent futher infection.    Wound Therapy - Functional Problem List difficulty walking   Factors Delaying/Impairing Wound Healing --  difficulty view wound area by patient.    Wound Therapy - Frequency --  1x a week   Wound Plan Increase dryness of wound environment and wrap as needed to maintain proper wound healing environment.  Check on Upper thigh next visit.     Decrease Necrotic Tissue to 0%   Increase Granulation Tissue to 100%   Decrease Length/Width/Depth by (cm) 2cm x 2cm   Improve Drainage Characteristics Min   Patient/Family will be able to  change dressing independently   Time For Goal Achievement 2 weeks   Wound Therapy - Potential for Goals Good        Problem List Patient Active Problem List   Diagnosis Date Noted  . Delirium 01/13/2014  . Diastolic CHF, acute on chronic 01/13/2014  . Expressive aphasia 01/13/2014  . Acute encephalopathy 03/16/2013  . Acute on chronic renal failure 03/16/2013  . Hypoglycemia 03/16/2013  . CHF (congestive heart failure)   . Unstable balance 01/16/2013  . Difficulty in walking(719.7) 01/16/2013  . Infected wound 01/16/2013  . Hx of right BKA 12/23/2012  . Candidiasis of breast 12/08/2012  . S/P bilateral BKA (below knee amputation) 11/30/2012  . Diabetes 11/30/2012  . Diabetic foot ulcer 10/08/2012  . UTI (lower urinary tract infection) 10/07/2012  . Bacteremia due to Staphylococcus aureus 10/07/2012   . Anxiety 02/17/2012  . Osteomyelitis of right foot 02/14/2012  . Hematuria, microscopic 02/13/2012  . Hyponatremia 02/13/2012  . PVD (peripheral vascular disease) 02/08/2012  . Open wound of heel 02/08/2012  . Hyperlipidemia 11/11/2011  . Hypertension 11/11/2011  . Morbid obesity 11/11/2011  . Tachycardia 08/04/2011  . Acute exacerbation of CHF (congestive heart failure) 07/25/2011  . Diabetes mellitus with neuropathy 07/25/2011  . OSA (obstructive sleep apnea) 07/25/2011  . Acute bronchitis 07/25/2011  . Generalized weakness 07/25/2011  . Acute respiratory failure 07/25/2011  . Restless leg syndrome 07/25/2011  . Anemia 07/25/2011    Teena Irani, PTA/CLT [phone] 05/13/2014, 5:42 PM

## 2014-05-14 ENCOUNTER — Ambulatory Visit (HOSPITAL_COMMUNITY): Payer: Self-pay | Admitting: Physical Therapy

## 2014-05-16 ENCOUNTER — Ambulatory Visit (HOSPITAL_COMMUNITY): Payer: Medicare HMO | Admitting: Physical Therapy

## 2014-05-21 ENCOUNTER — Ambulatory Visit (HOSPITAL_COMMUNITY): Payer: Medicare HMO

## 2014-05-23 ENCOUNTER — Ambulatory Visit (HOSPITAL_COMMUNITY): Payer: Medicare HMO | Admitting: Physical Therapy

## 2014-07-31 IMAGING — CR DG CHEST 1V PORT
1 series · 1 of 1 positions shown · non-contrast
Comparison: 10/07/2012 and earlier.

CLINICAL DATA: 66-year-old female PICC line placement.

PORTABLE CHEST - 1 VIEW

[view not recorded]
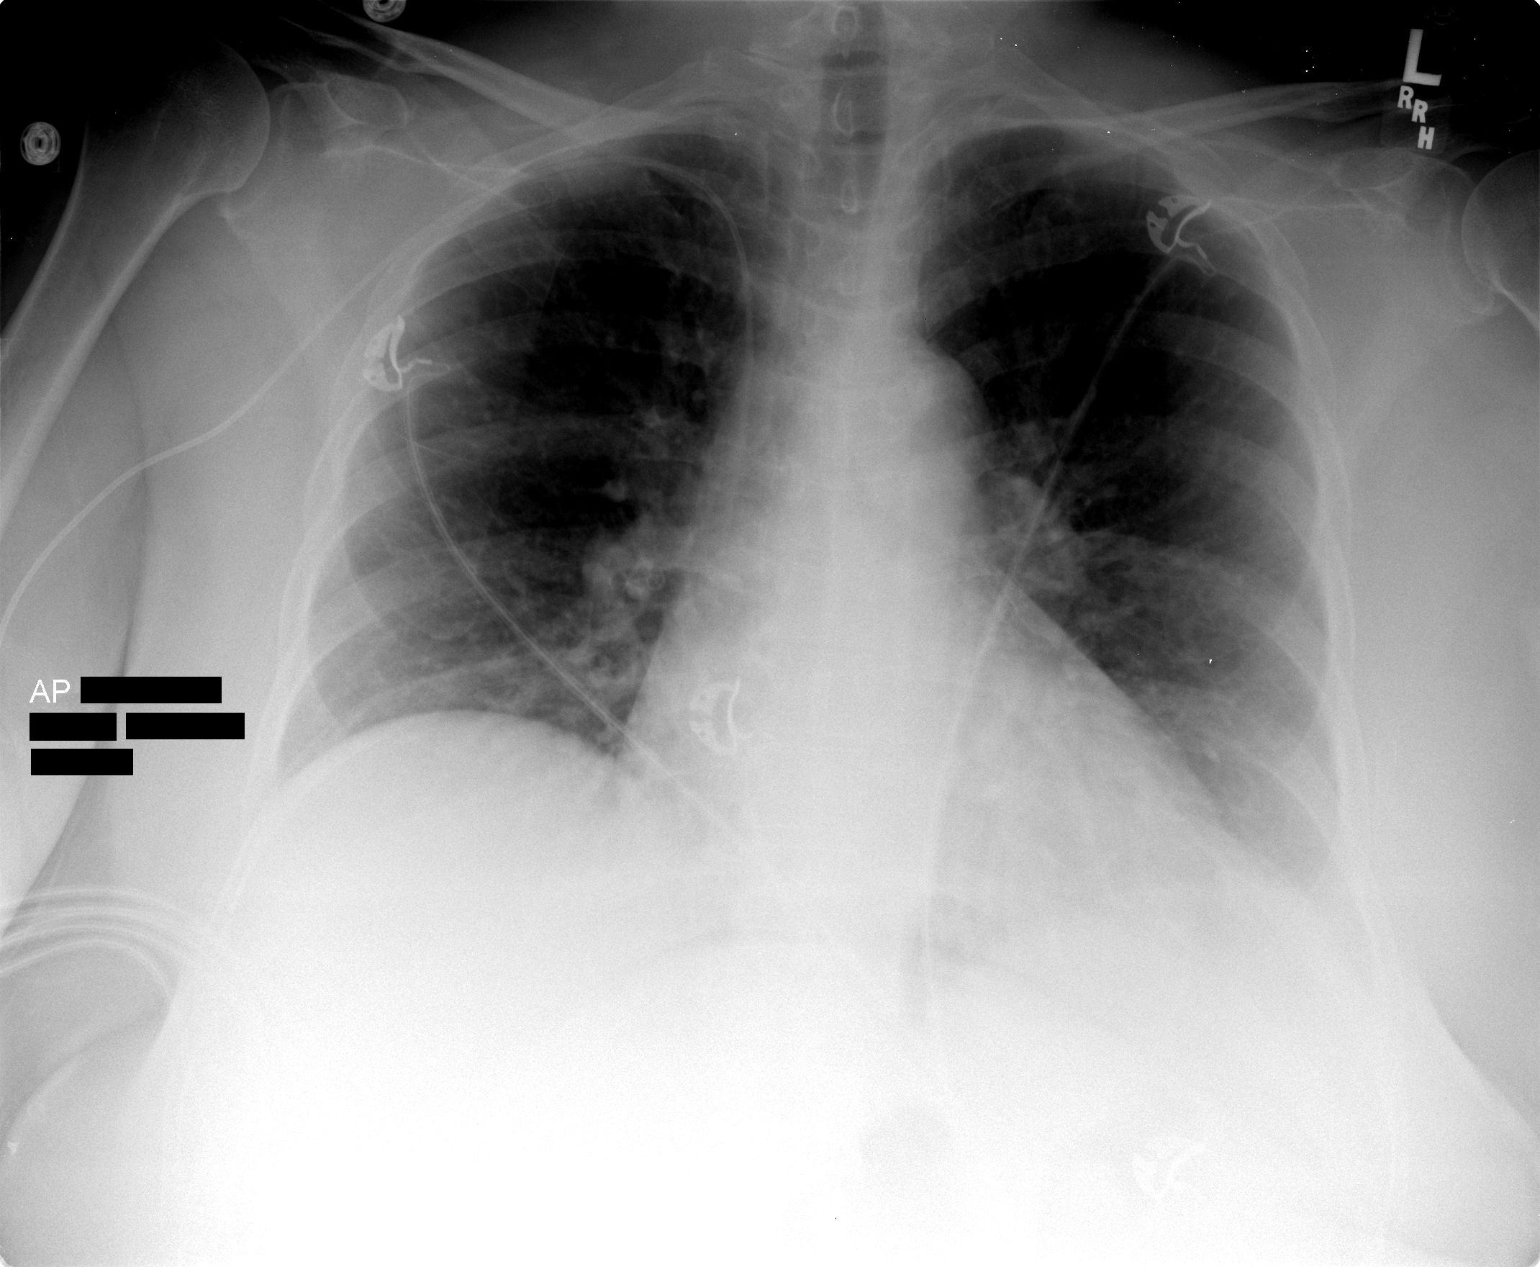

[1 of 1 positions shown; findings below may reference images not displayed]

FINDINGS: Portable upright AP view at 6355 hours.  Right side PICC
line placed.  Tip at the level of the lower SVC or just at the
superior cavoatrial junction.  Mildly lower lung volumes for this
image.  Mildly increased streaky bibasilar opacity.  The  Stable
cardiac size and mediastinal contours.  Visualized tracheal air
column is within normal limits.  No other new pulmonary opacity.
IMPRESSION: Right side PICC line, tip at the lower SVC level.

## 2014-10-18 ENCOUNTER — Other Ambulatory Visit: Payer: Self-pay | Admitting: Cardiology

## 2015-01-02 ENCOUNTER — Other Ambulatory Visit (HOSPITAL_COMMUNITY): Payer: Self-pay | Admitting: Internal Medicine

## 2015-01-02 DIAGNOSIS — M545 Low back pain, unspecified: Secondary | ICD-10-CM

## 2015-01-09 ENCOUNTER — Ambulatory Visit (HOSPITAL_COMMUNITY): Payer: PPO

## 2015-01-10 ENCOUNTER — Ambulatory Visit (HOSPITAL_COMMUNITY)
Admission: RE | Admit: 2015-01-10 | Discharge: 2015-01-10 | Disposition: A | Discharge status: Home / Self Care | Payer: PPO | Source: Ambulatory Visit | Attending: Internal Medicine | Admitting: Internal Medicine

## 2015-01-10 DIAGNOSIS — M545 Low back pain, unspecified: Secondary | ICD-10-CM

## 2015-01-22 ENCOUNTER — Inpatient Hospital Stay (HOSPITAL_COMMUNITY): Admission: RE | Admit: 2015-01-22 | Payer: Self-pay | Source: Ambulatory Visit

## 2015-03-06 ENCOUNTER — Other Ambulatory Visit: Payer: Self-pay | Admitting: Cardiology

## 2015-03-10 ENCOUNTER — Ambulatory Visit (HOSPITAL_COMMUNITY): Payer: PPO | Attending: Internal Medicine | Admitting: Physical Therapy

## 2015-03-10 DIAGNOSIS — S81001A Unspecified open wound, right knee, initial encounter: Secondary | ICD-10-CM | POA: Insufficient documentation

## 2015-03-10 DIAGNOSIS — T798XXS Other early complications of trauma, sequela: Secondary | ICD-10-CM | POA: Diagnosis present

## 2015-03-10 DIAGNOSIS — S81801A Unspecified open wound, right lower leg, initial encounter: Secondary | ICD-10-CM | POA: Insufficient documentation

## 2015-03-10 DIAGNOSIS — R2689 Other abnormalities of gait and mobility: Secondary | ICD-10-CM | POA: Insufficient documentation

## 2015-03-10 DIAGNOSIS — T148XXA Other injury of unspecified body region, initial encounter: Secondary | ICD-10-CM

## 2015-03-10 DIAGNOSIS — T798XXD Other early complications of trauma, subsequent encounter: Secondary | ICD-10-CM | POA: Insufficient documentation

## 2015-03-10 DIAGNOSIS — T148 Other injury of unspecified body region: Secondary | ICD-10-CM | POA: Diagnosis present

## 2015-03-10 DIAGNOSIS — S91001A Unspecified open wound, right ankle, initial encounter: Secondary | ICD-10-CM | POA: Insufficient documentation

## 2015-03-10 DIAGNOSIS — R600 Localized edema: Secondary | ICD-10-CM | POA: Diagnosis present

## 2015-03-10 DIAGNOSIS — X58XXXD Exposure to other specified factors, subsequent encounter: Secondary | ICD-10-CM | POA: Diagnosis not present

## 2015-03-10 NOTE — Therapy (Signed)
[place], [place], [address] Phone: [phone]   Fax:  [phone]  Wound Care Evaluation  Patient Details  Name: Mary [name] MRN: [id number] Date of Birth: Jun 07, 1945 Referring Provider:  Celene Squibb, MD  Encounter Date: 03/10/2015      PT End of Session - 03/10/15 1135    Visit Number 1   Number of Visits 13   Date for PT Re-Evaluation 04/09/15   Authorization Type [employer] Medicare   Authorization - Visit Number 1   Authorization - Number of Visits 10   PT Start Time 0930   PT Stop Time 1015   PT Time Calculation (min) 45 min   Activity Tolerance Patient tolerated treatment well      Past Medical History  Diagnosis Date  . CHF (congestive heart failure)   . Diabetes mellitus   . Restless leg syndrome 07/25/2011  . Wound, open     right foot  . Diabetes mellitus with neuropathy 07/25/2011  . Cellulitis and abscess of foot 02/12/2012  . Osteomyelitis of right foot 02/14/2012  . Partial Achilles tendon tear 02/14/2012  . Dysrhythmia     tachy- takes Metoprolol  . OSA (obstructive sleep apnea) 07/25/2011    not using CPAP  . Arthritis     knees  . Anemia   . Complication of anesthesia     pt states after her hysterectomy the doctor said she was "wild" when she woke up     Past Surgical History  Procedure Laterality Date  . Abdominal hysterectomy    . Achilles tendon repair    . Toe amputation      partial rt great toe  . Below knee leg amputation Right 11/25/2012    Dr Sharol Given  . Amputation Right 11/25/2012    Procedure: AMPUTATION BELOW KNEE;  Surgeon: Newt Minion, MD;  Location: [facility];  Service: Orthopedics;  Laterality: Right;  Right Below Knee Amputation  . Breast surgery Left     Lumpectomy non ca  . Cataract extraction w/phaco Right 10/09/2013    Procedure: CATARACT EXTRACTION PHACO AND INTRAOCULAR LENS PLACEMENT (IOC);  Surgeon: Elta Guadeloupe T. Gershon Crane, MD;  Location: [facility];  Service:  Ophthalmology;  Laterality: Right;  CDE:9.05  . Cataract extraction w/phaco Left 10/23/2013    Procedure: ATTEMPTED CATARACT EXTRACTION PHACO AND INTRAOCULAR LENS PLACEMENT;  Surgeon: Elta Guadeloupe T. Gershon Crane, MD;  Location: [facility];  Service: Ophthalmology;  Laterality: Left;  CDE:  4.41    There were no vitals filed for this visit.  Visit Diagnosis:  Nonhealing nonsurgical wound  Infected wound, sequela  Edema of foot         Wound Therapy - 03/10/15 1102    Subjective Pt states that a small wound came up on her  Lt great toe about three weeks ago.  It was very small.  Her husband has been completing dressing changes but the wound continues to deteriorate.  She went to the MD on Thursday who placed her on antibiotics and referred her to therapy for wound care.  The patient states that in the mornings her blood sugar is fine but if she eats something that she is not suppose to have her blood sugars will go into the 200's.  PT Has a BKA on her Rt LE and ambulates with a walker.    Patient and Family Stated Goals wound to heal    Date of Onset 02/19/15   Prior Treatments self  treat, now on antibiotics.    Evaluation and Treatment Procedures Explained to Patient/Family Yes   Evaluation and Treatment Procedures agreed to   Wound Properties Date First Assessed: 03/10/15 Time First Assessed: 0938 Wound Type: Diabetic ulcer Location: Foot , plantar aspect of 1st MT winds into plantar of Great toe.     Dressing Type Gauze (Comment);Silver hydrofiber   Dressing Changed Changed   Dressing Status Old drainage   Dressing Change Frequency PRN   Site / Wound Assessment Brown;Dusky   % Wound base Red or Granulating 0%   % Wound base Other (Comment) 100%  thin brown film    Peri-wound Assessment Edema;Erythema (blanchable);Maceration   Wound Length (cm) 1.3 cm   Wound Width (cm) 2.3 cm   Wound Depth (cm) 0.3 cm   Margins Epibole (rolled edges)   Drainage Amount Moderate   Drainage Description  Serous;Odor   Treatment Cleansed;Debridement (Selective);Other (Comment)   Wound Properties Date First Assessed: 03/10/15 Time First Assessed: 0935 Wound Type: Diabetic ulcer Location: Toe (Comment  which one) , 4th  Location Orientation: Left , plantar aspect   Present on Admission: No   Dressing Type Silver hydrofiber   Dressing Changed Changed   Dressing Status Old drainage   Dressing Change Frequency PRN   Site / Wound Assessment Dusky   % Wound base Red or Granulating 20%   % Wound base Yellow 80%   Peri-wound Assessment Edema;Erythema (blanchable);Maceration   Wound Length (cm) 0.7 cm   Wound Width (cm) 0.7 cm   Wound Depth (cm) 0.2 cm   Undermining (cm) .4  both mediallly and inferiorly   Drainage Amount Moderate   Drainage Description Serous;Odor   Treatment Cleansed;Debridement (Selective)   Selective Debridement - Location Both wounds   Selective Debridement - Tools Used Forceps;Scissors   Selective Debridement - Tissue Removed slough and callous   Wound Therapy - Clinical Statement Mary [name] is a 69 yo diabetic who has had a BKA on her Rt LE.  She comes to the department for wound care on her Lt foot.  She is currently on antibiotics.  Her wounds are prorgressive in nature.  She will benefit from skilled physical therapy to create a proper environment for wound healing.    Wound Therapy - Functional Problem List dificulty walking    Factors Delaying/Impairing Wound Healing Altered sensation;Diabetes Mellitus;Infection - systemic/local;Multiple medical problems;Vascular compromise   Hydrotherapy Plan Debridement;Dressing change;Patient/family education   Wound Therapy - Frequency 3X / week  for the first week then decrease to two times a week    Wound Therapy - Current Recommendations PT   Wound Plan Pt to be seen three times a week for the first week; if maceration is gone decrease to twice a week for five addictional weeks.    Dressing  vasiline to perimeter followed by  silver hydrofiber to wound sites; 2x2 followed by 2" kling.    Decrease Necrotic Tissue to STG:   2 weeks 0   Decrease Necrotic Tissue - Progress Goal set today   Increase Granulation Tissue to STG:  2 weeks 100   Increase Granulation Tissue - Progress Goal set today   Decrease Length/Width/Depth by (cm) STG:  3 weeks:  decrease both wound sites by 50%;  LTG:  6 weeks:  Decrease both wound sites by 75 %    Decrease Length/Width/Depth - Progress Goal set today   Improve Drainage Characteristics Min   Improve Drainage Characteristics - Progress Goal set today  STG:  2weeks minimum:  LTG: 3 weeks scant    Patient/Family will be able to  I in dressing change   Patient/Family Instruction Goal - Progress Goal set today   Additional Wound Therapy Goal PT to verbalize the importance of maintaining good blood sugars for optimal healing.    Additional Wound Therapy Goal - Progress Goal set today   Time For Goal Achievement --  6 weeks    Wound Therapy - Potential for Goals Good              PT Education - 2015/03/24 1133    Education provided Yes   Education Details The importance of keeping blood sugars under control   Person(s) Educated Patient   Methods Explanation                G-Codes - 24-Mar-2015 1140    Functional Assessment Tool Used wound granulation; maceration of surrounding skin; undermining    Functional Limitation Other PT primary   Other PT Primary Current Status [id number]) At least 80 percent but less than 100 percent impaired, limited or restricted   Other PT Primary Goal Status [id number]) At least 1 percent but less than 20 percent impaired, limited or restricted      Problem List Patient Active Problem List   Diagnosis Date Noted  . Delirium 01/13/2014  . Diastolic CHF, acute on chronic (HCC) 01/13/2014  . Expressive aphasia 01/13/2014  . Acute encephalopathy 03/16/2013  . Acute on chronic renal failure ([place]) 03/16/2013  . Hypoglycemia 03/16/2013  . CHF  (congestive heart failure) ([place])   . Unstable balance 01/16/2013  . Difficulty in walking(719.7) 01/16/2013  . Infected wound ([place]) 01/16/2013  . Hx of right BKA ([place]) 12/23/2012  . Candidiasis of breast 12/08/2012  . S/P bilateral BKA (below knee amputation) ([facility]) 11/30/2012  . Diabetes ([place]) 11/30/2012  . Diabetic foot ulcer ([place]) 10/08/2012  . UTI (lower urinary tract infection) 10/07/2012  . Bacteremia due to Staphylococcus aureus 10/07/2012  . Anxiety 02/17/2012  . Osteomyelitis of right foot ([place]) 02/14/2012  . Hematuria, microscopic 02/13/2012  . Hyponatremia 02/13/2012  . PVD (peripheral vascular disease) ([place]) 02/08/2012  . Open wound of heel 02/08/2012  . Hyperlipidemia 11/11/2011  . Hypertension 11/11/2011  . Morbid obesity ([place]) 11/11/2011  . Tachycardia 08/04/2011  . Acute exacerbation of CHF (congestive heart failure) ([place]) 07/25/2011  . Diabetes mellitus with neuropathy 07/25/2011  . OSA (obstructive sleep apnea) 07/25/2011  . Acute bronchitis 07/25/2011  . Generalized weakness 07/25/2011  . Acute respiratory failure ([place]) 07/25/2011  . Restless leg syndrome 07/25/2011  . Anemia 07/25/2011  Rayetta Humphrey, PT CLT [phone]  24-Mar-2015, 11:41 AM  [place], [place], [address] Phone: [phone]   Fax:  [phone]

## 2015-03-12 ENCOUNTER — Ambulatory Visit (HOSPITAL_COMMUNITY): Payer: PPO

## 2015-03-12 DIAGNOSIS — T798XXS Other early complications of trauma, sequela: Secondary | ICD-10-CM

## 2015-03-12 DIAGNOSIS — R2689 Other abnormalities of gait and mobility: Secondary | ICD-10-CM

## 2015-03-12 DIAGNOSIS — T798XXD Other early complications of trauma, subsequent encounter: Secondary | ICD-10-CM

## 2015-03-12 DIAGNOSIS — T148 Other injury of unspecified body region: Secondary | ICD-10-CM | POA: Diagnosis not present

## 2015-03-12 DIAGNOSIS — S81801A Unspecified open wound, right lower leg, initial encounter: Secondary | ICD-10-CM

## 2015-03-12 DIAGNOSIS — T148XXA Other injury of unspecified body region, initial encounter: Secondary | ICD-10-CM

## 2015-03-12 DIAGNOSIS — S81001A Unspecified open wound, right knee, initial encounter: Secondary | ICD-10-CM

## 2015-03-12 DIAGNOSIS — S91001A Unspecified open wound, right ankle, initial encounter: Secondary | ICD-10-CM

## 2015-03-12 DIAGNOSIS — R600 Localized edema: Secondary | ICD-10-CM

## 2015-03-12 NOTE — Therapy (Signed)
[place], [place], [address] Phone: [phone]   Fax:  [phone]  Wound Care Therapy  Patient Details  Name: Mary [name] MRN: [id number] Date of Birth: 10/01/1945 Referring Provider:  Celene Squibb, MD  Encounter Date: 03/12/2015      PT End of Session - 03/12/15 0853    Visit Number 2   Number of Visits 13   Date for PT Re-Evaluation 04/09/15   Authorization Type [employer]na Medicare   Authorization - Visit Number 2   Authorization - Number of Visits 10   PT Start Time 0808   PT Stop Time 0839   PT Time Calculation (min) 31 min   Activity Tolerance Patient tolerated treatment well   Behavior During Therapy [facility] for tasks assessed/performed      Past Medical History  Diagnosis Date  . CHF (congestive heart failure)   . Diabetes mellitus   . Restless leg syndrome 07/25/2011  . Wound, open     right foot  . Diabetes mellitus with neuropathy 07/25/2011  . Cellulitis and abscess of foot 02/12/2012  . Osteomyelitis of right foot 02/14/2012  . Partial Achilles tendon tear 02/14/2012  . Dysrhythmia     tachy- takes Metoprolol  . OSA (obstructive sleep apnea) 07/25/2011    not using CPAP  . Arthritis     knees  . Anemia   . Complication of anesthesia     pt states after her hysterectomy the doctor said she was "wild" when she woke up     Past Surgical History  Procedure Laterality Date  . Abdominal hysterectomy    . Achilles tendon repair    . Toe amputation      partial rt great toe  . Below knee leg amputation Right 11/25/2012    Dr Sharol Given  . Amputation Right 11/25/2012    Procedure: AMPUTATION BELOW KNEE;  Surgeon: Newt Minion, MD;  Location: [facility];  Service: Orthopedics;  Laterality: Right;  Right Below Knee Amputation  . Breast surgery Left     Lumpectomy non ca  . Cataract extraction w/phaco Right 10/09/2013    Procedure: CATARACT EXTRACTION PHACO AND INTRAOCULAR LENS PLACEMENT (IOC);  Surgeon:  Elta Guadeloupe T. Gershon Crane, MD;  Location: [facility];  Service: Ophthalmology;  Laterality: Right;  CDE:9.05  . Cataract extraction w/phaco Left 10/23/2013    Procedure: ATTEMPTED CATARACT EXTRACTION PHACO AND INTRAOCULAR LENS PLACEMENT;  Surgeon: Elta Guadeloupe T. Gershon Crane, MD;  Location[facility];  Service: Ophthalmology;  Laterality: Left;  CDE:  4.41    There were no vitals filed for this visit.  Visit Diagnosis:  Nonhealing nonsurgical wound  Infected wound, sequela  Edema of foot  Open wound of knee, leg (except thigh), and ankle, complicated, right, initial encounter  Unstable balance  Infected wound, subsequent encounter      Subjective Assessment - 03/12/15 0845    Subjective Pt stated currently pain free, ate half a burger last night and blood sugar levels at 94 which pt. stated was low for her so ate 2 tablespoons of browsugar,  Dressings are intact   Currently in Pain? No/denies           Wound Therapy - 03/12/15 0846    Subjective Pt stated currently pain free, ate half a burger last night and blood sugar levels at 94 which pt. stated was low for her so ate 2 tablespoons of browsugar,  Dressings are intact   Patient and Family Stated Goals  wound to heal    Date of Onset 02/19/15   Prior Treatments self treat, now on antibiotics.    Evaluation and Treatment Procedures Explained to Patient/Family Yes   Evaluation and Treatment Procedures agreed to   Wound Properties Date First Assessed: 03/10/15 Time First Assessed: 0938 Wound Type: Diabetic ulcer Location: Foot , plantar aspect of 1st MT winds into plantar of Great toe.     Dressing Type Gauze (Comment);Silver hydrofiber   Dressing Changed Changed   Dressing Status Dry;Intact   Dressing Change Frequency PRN   Site / Wound Assessment Brown;Dusky   % Wound base Red or Granulating 10%   % Wound base Other (Comment) 90%  thin brown film   Margins Epibole (rolled edges)   Drainage Amount Minimal   Drainage Description Serous;Odor    Treatment Cleansed;Debridement (Selective)   Wound Properties Date First Assessed: 03/10/15 Time First Assessed: 0935 Wound Type: Diabetic ulcer Location: Toe (Comment  which one) , 4th  Location Orientation: Left , plantar aspect   Present on Admission: No   Dressing Type Silver hydrofiber;Gauze (Comment)  vaseline perimeter of wound   Dressing Changed Changed   Dressing Status Dry;Intact   Dressing Change Frequency PRN   Site / Wound Assessment Dusky   % Wound base Red or Granulating 30%   % Wound base Yellow 70%   Peri-wound Assessment Edema;Erythema (blanchable);Maceration   Drainage Amount Minimal   Drainage Description Serous;Odor   Treatment Cleansed;Debridement (Selective)   Selective Debridement - Location Both wounds   Selective Debridement - Tools Used Forceps;Scissors   Selective Debridement - Tissue Removed slough and callous   Wound Therapy - Clinical Statement Selective debridement for removal of macerated tissue distal wound on plantarsurface, removal of callous 2nd toe the perimeter of wound both to promote healing.  Continued with silverhydrofiber, 2x2 and gauze with netting.  Pt instructed to leave dressings intact until next session unless noted drainage through dressings.  Copy of evaluation given to pt.   Wound Therapy - Functional Problem List dificulty walking    Factors Delaying/Impairing Wound Healing Altered sensation;Diabetes Mellitus;Infection - systemic/local;Multiple medical problems;Vascular compromise   Hydrotherapy Plan Debridement;Dressing change;Patient/family education   Wound Therapy - Frequency 3X / week   Wound Therapy - Current Recommendations PT   Wound Plan Pt to be seen three times a week for the first week; if maceration is gone decrease to twice a week for five addictional weeks.    Dressing  vasiline to perimeter followed by silver hydrofiber to wound sites; 2x2 followed by 2" kling.          Problem List Patient Active Problem List    Diagnosis Date Noted  . Delirium 01/13/2014  . Diastolic CHF, acute on chronic (HCC) 01/13/2014  . Expressive aphasia 01/13/2014  . Acute encephalopathy 03/16/2013  . Acute on chronic renal failure ([place]) 03/16/2013  . Hypoglycemia 03/16/2013  . CHF (congestive heart failure) ([place])   . Unstable balance 01/16/2013  . Difficulty in walking(719.7) 01/16/2013  . Infected wound ([place]) 01/16/2013  . Hx of right BKA ([place]) 12/23/2012  . Candidiasis of breast 12/08/2012  . S/P bilateral BKA (below knee amputation) ([place]) 11/30/2012  . Diabetes ([place]) 11/30/2012  . Diabetic foot ulcer ([place]) 10/08/2012  . UTI (lower urinary tract infection) 10/07/2012  . Bacteremia due to Staphylococcus aureus 10/07/2012  . Anxiety 02/17/2012  . Osteomyelitis of right foot ([place]) 02/14/2012  . Hematuria, microscopic 02/13/2012  . Hyponatremia 02/13/2012  . PVD (peripheral vascular disease) ([place])  02/08/2012  . Open wound of heel 02/08/2012  . Hyperlipidemia 11/11/2011  . Hypertension 11/11/2011  . Morbid obesity ([place]) 11/11/2011  . Tachycardia 08/04/2011  . Acute exacerbation of CHF (congestive heart failure) ([place]) 07/25/2011  . Diabetes mellitus with neuropathy 07/25/2011  . OSA (obstructive sleep apnea) 07/25/2011  . Acute bronchitis 07/25/2011  . Generalized weakness 07/25/2011  . Acute respiratory failure ([place]) 07/25/2011  . Restless leg syndrome 07/25/2011  . Anemia 07/25/2011   Ihor Austin, [place]; [place]  Aldona Lento 03/12/2015, 8:54 AM  [place], [place], [address] Phone: [phone]   Fax:  [phone]

## 2015-03-14 ENCOUNTER — Ambulatory Visit (HOSPITAL_COMMUNITY): Payer: PPO

## 2015-03-14 DIAGNOSIS — T798XXD Other early complications of trauma, subsequent encounter: Secondary | ICD-10-CM

## 2015-03-14 DIAGNOSIS — R600 Localized edema: Secondary | ICD-10-CM

## 2015-03-14 DIAGNOSIS — T148 Other injury of unspecified body region: Secondary | ICD-10-CM | POA: Diagnosis not present

## 2015-03-14 DIAGNOSIS — S91001A Unspecified open wound, right ankle, initial encounter: Secondary | ICD-10-CM

## 2015-03-14 DIAGNOSIS — T148XXA Other injury of unspecified body region, initial encounter: Secondary | ICD-10-CM

## 2015-03-14 DIAGNOSIS — T798XXS Other early complications of trauma, sequela: Secondary | ICD-10-CM

## 2015-03-14 DIAGNOSIS — S81801A Unspecified open wound, right lower leg, initial encounter: Secondary | ICD-10-CM

## 2015-03-14 DIAGNOSIS — R2689 Other abnormalities of gait and mobility: Secondary | ICD-10-CM

## 2015-03-14 DIAGNOSIS — S81001A Unspecified open wound, right knee, initial encounter: Secondary | ICD-10-CM

## 2015-03-14 NOTE — Therapy (Signed)
[place], [place], [address] Phone: [phone]   Fax:  [phone]  Wound Care Therapy  Patient Details  Name: Mary [name] MRN: [id number] Date of Birth: 1945/09/24 No Data Recorded  Encounter Date: 03/14/2015      PT End of Session - 03/14/15 1202    Visit Number 3   Number of Visits 13   Date for PT Re-Evaluation 04/09/15   Authorization Type Aetna Medicare   Authorization - Visit Number 3   Authorization - Number of Visits 10   PT Start Time 1103   PT Stop Time 1142   PT Time Calculation (min) 39 min   Activity Tolerance Patient tolerated treatment well   Behavior During Therapy [facility] for tasks assessed/performed      Past Medical History  Diagnosis Date  . CHF (congestive heart failure)   . Diabetes mellitus   . Restless leg syndrome 07/25/2011  . Wound, open     right foot  . Diabetes mellitus with neuropathy 07/25/2011  . Cellulitis and abscess of foot 02/12/2012  . Osteomyelitis of right foot 02/14/2012  . Partial Achilles tendon tear 02/14/2012  . Dysrhythmia     tachy- takes Metoprolol  . OSA (obstructive sleep apnea) 07/25/2011    not using CPAP  . Arthritis     knees  . Anemia   . Complication of anesthesia     pt states after her hysterectomy the doctor said she was "wild" when she woke up     Past Surgical History  Procedure Laterality Date  . Abdominal hysterectomy    . Achilles tendon repair    . Toe amputation      partial rt great toe  . Below knee leg amputation Right 11/25/2012    Dr Sharol Given  . Amputation Right 11/25/2012    Procedure: AMPUTATION BELOW KNEE;  Surgeon: Newt Minion, MD;  Location: [facility];  Service: Orthopedics;  Laterality: Right;  Right Below Knee Amputation  . Breast surgery Left     Lumpectomy non ca  . Cataract extraction w/phaco Right 10/09/2013    Procedure: CATARACT EXTRACTION PHACO AND INTRAOCULAR LENS PLACEMENT (IOC);  Surgeon: Elta Guadeloupe T. Gershon Crane, MD;   Location: [facility];  Service: Ophthalmology;  Laterality: Right;  CDE:9.05  . Cataract extraction w/phaco Left 10/23/2013    Procedure: ATTEMPTED CATARACT EXTRACTION PHACO AND INTRAOCULAR LENS PLACEMENT;  Surgeon: Elta Guadeloupe T. Gershon Crane, MD;  Location: [facility];  Service: Ophthalmology;  Laterality: Left;  CDE:  4.41    There were no vitals filed for this visit.  Visit Diagnosis:  Nonhealing nonsurgical wound  Infected wound, sequela  Edema of foot  Open wound of knee, leg (except thigh), and ankle, complicated, right, initial encounter  Unstable balance  Infected wound, subsequent encounter      Subjective Assessment - 03/14/15 1155    Subjective Pt reported dressing came off in bed last night, husband applied new dressing.  Has been checking blood sugar levels daily.  No reports of pain/   Currently in Pain? No/denies           Wound Therapy - 03/14/15 1156    Subjective Pt reported dressing came off in bed last night, husband applied new dressing.  Has been checking blood sugar levels daily.  No reports of pain/Pt reported dressing came off in bed last night, husband applied new dressing.  Has been checking blood sugar levels daily.  No reports of pain/  Patient and Family Stated Goals wound to heal    Date of Onset 02/19/15   Prior Treatments self treat, now on antibiotics.    Pain Assessment No/denies pain   Evaluation and Treatment Procedures Explained to Patient/Family Yes   Evaluation and Treatment Procedures agreed to   Wound Properties Date First Assessed: 03/10/15 Time First Assessed: 0938 Wound Type: Diabetic ulcer Location: Foot , plantar aspect of 1st MT winds into plantar of Great toe.     Dressing Type Gauze (Comment);Silver hydrofiber   Dressing Changed Changed   Dressing Status Dry;Intact   Dressing Change Frequency PRN   Site / Wound Assessment Brown;Dusky;Granulation tissue   % Wound base Red or Granulating 35%   % Wound base Other (Comment) 65%  thin brown  film   Wound Depth (cm) 0.2 cm   Margins Epibole (rolled edges)   Drainage Amount Minimal   Drainage Description Serous;Odor   Treatment Cleansed;Debridement (Selective)   Wound Properties Date First Assessed: 03/10/15 Time First Assessed: 0935 Wound Type: Diabetic ulcer Location: Toe (Comment  which one) , 4th  Location Orientation: Left , plantar aspect   Present on Admission: No   Dressing Type Silver hydrofiber;Gauze (Comment)   Dressing Changed Changed   Dressing Status Dry;Intact   Dressing Change Frequency PRN   Site / Wound Assessment Dusky   % Wound base Red or Granulating 35%   % Wound base Yellow 65%   Wound Depth (cm) 0.2 cm   Drainage Amount Scant   Drainage Description Serous;Odor   Treatment Cleansed;Debridement (Selective)   Selective Debridement - Location Both wounds   Selective Debridement - Tools Used Forceps;Scalpel   Selective Debridement - Tissue Removed slough and callous   Wound Therapy - Clinical Statement Removal of callous, maceration tissue on metatarsal region and dry skin surrounding wound to promote healing with decreased depth to toe.  Continued with silver hydrofiber dressings with vaseline perimeter of wound.  No reports of pain through session.   Wound Therapy - Functional Problem List dificulty walking    Factors Delaying/Impairing Wound Healing Altered sensation;Diabetes Mellitus;Infection - systemic/local;Multiple medical problems;Vascular compromise   Hydrotherapy Plan Debridement;Dressing change;Patient/family education   Wound Therapy - Frequency 3X / week   Wound Therapy - Current Recommendations PT   Wound Plan Pt to be seen three times a week for the first week; if maceration is gone decrease to twice a week for five addictional weeks.    Dressing  vasiline to perimeter followed by silver hydrofiber to wound sites; 2x2 followed by 2" kling.         Problem List Patient Active Problem List   Diagnosis Date Noted  . Delirium 01/13/2014   . Diastolic CHF, acute on chronic (HCC) 01/13/2014  . Expressive aphasia 01/13/2014  . Acute encephalopathy 03/16/2013  . Acute on chronic renal failure ([place]) 03/16/2013  . Hypoglycemia 03/16/2013  . CHF (congestive heart failure) ([place])   . Unstable balance 01/16/2013  . Difficulty in walking(719.7) 01/16/2013  . Infected wound ([place]) 01/16/2013  . Hx of right BKA ([place]) 12/23/2012  . Candidiasis of breast 12/08/2012  . S/P bilateral BKA (below knee amputation) ([place]) 11/30/2012  . Diabetes ([place]) 11/30/2012  . Diabetic foot ulcer ([place]) 10/08/2012  . UTI (lower urinary tract infection) 10/07/2012  . Bacteremia due to Staphylococcus aureus 10/07/2012  . Anxiety 02/17/2012  . Osteomyelitis of right foot ([place]) 02/14/2012  . Hematuria, microscopic 02/13/2012  . Hyponatremia 02/13/2012  . PVD (peripheral vascular disease) ([place]) 02/08/2012  .  Open wound of heel 02/08/2012  . Hyperlipidemia 11/11/2011  . Hypertension 11/11/2011  . Morbid obesity ([place]) 11/11/2011  . Tachycardia 08/04/2011  . Acute exacerbation of CHF (congestive heart failure) ([place]) 07/25/2011  . Diabetes mellitus with neuropathy 07/25/2011  . OSA (obstructive sleep apnea) 07/25/2011  . Acute bronchitis 07/25/2011  . Generalized weakness 07/25/2011  . Acute respiratory failure ([place]) 07/25/2011  . Restless leg syndrome 07/25/2011  . Anemia 07/25/2011   Ihor Austin, [place]; [place]  Aldona Lento 03/14/2015, 12:02 PM  [place], [place], [address] Phone: [phone]   Fax:  [phone]  Name: Mary [name] MRN: [id number] Date of Birth: [birth year]

## 2015-03-18 ENCOUNTER — Ambulatory Visit (HOSPITAL_COMMUNITY): Payer: PPO | Admitting: Physical Therapy

## 2015-03-18 DIAGNOSIS — R2689 Other abnormalities of gait and mobility: Secondary | ICD-10-CM

## 2015-03-18 DIAGNOSIS — T148 Other injury of unspecified body region: Secondary | ICD-10-CM | POA: Diagnosis not present

## 2015-03-18 DIAGNOSIS — S81001A Unspecified open wound, right knee, initial encounter: Secondary | ICD-10-CM

## 2015-03-18 DIAGNOSIS — S91001A Unspecified open wound, right ankle, initial encounter: Secondary | ICD-10-CM

## 2015-03-18 DIAGNOSIS — T798XXS Other early complications of trauma, sequela: Secondary | ICD-10-CM

## 2015-03-18 DIAGNOSIS — T148XXA Other injury of unspecified body region, initial encounter: Secondary | ICD-10-CM

## 2015-03-18 DIAGNOSIS — T798XXD Other early complications of trauma, subsequent encounter: Secondary | ICD-10-CM

## 2015-03-18 DIAGNOSIS — S81801A Unspecified open wound, right lower leg, initial encounter: Secondary | ICD-10-CM

## 2015-03-18 DIAGNOSIS — R600 Localized edema: Secondary | ICD-10-CM

## 2015-03-18 NOTE — Therapy (Signed)
[facility], [place], [address] Phone: [phone]   Fax:  [phone]  Wound Care Therapy  Patient Details  Name: Mary [name] MRN: [id number] Date of Birth: Jul 08, 1945 No Data Recorded  Encounter Date: 03/18/2015    Past Medical History  Diagnosis Date  . CHF (congestive heart failure)   . Diabetes mellitus   . Restless leg syndrome 07/25/2011  . Wound, open     right foot  . Diabetes mellitus with neuropathy 07/25/2011  . Cellulitis and abscess of foot 02/12/2012  . Osteomyelitis of right foot 02/14/2012  . Partial Achilles tendon tear 02/14/2012  . Dysrhythmia     tachy- takes Metoprolol  . OSA (obstructive sleep apnea) 07/25/2011    not using CPAP  . Arthritis     knees  . Anemia   . Complication of anesthesia     pt states after her hysterectomy the doctor said she was "wild" when she woke up     Past Surgical History  Procedure Laterality Date  . Abdominal hysterectomy    . Achilles tendon repair    . Toe amputation      partial rt great toe  . Below knee leg amputation Right 11/25/2012    Dr Sharol Given  . Amputation Right 11/25/2012    Procedure: AMPUTATION BELOW KNEE;  Surgeon: Newt Minion, MD;  Location: [facility];  Service: Orthopedics;  Laterality: Right;  Right Below Knee Amputation  . Breast surgery Left     Lumpectomy non ca  . Cataract extraction w/phaco Right 10/09/2013    Procedure: CATARACT EXTRACTION PHACO AND INTRAOCULAR LENS PLACEMENT (IOC);  Surgeon: Elta Guadeloupe T. Gershon Crane, MD;  Location: [facility];  Service: Ophthalmology;  Laterality: Right;  CDE:9.05  . Cataract extraction w/phaco Left 10/23/2013    Procedure: ATTEMPTED CATARACT EXTRACTION PHACO AND INTRAOCULAR LENS PLACEMENT;  Surgeon: Elta Guadeloupe T. Gershon Crane, MD;  Location: [facility];  Service: Ophthalmology;  Laterality: Left;  CDE:  4.41    There were no vitals filed for this visit.  Visit Diagnosis:  Nonhealing nonsurgical wound  Infected wound,  sequela  Edema of foot  Open wound of knee, leg (except thigh), and ankle, complicated, right, initial encounter  Unstable balance  Infected wound, subsequent encounter           Wound Therapy - 03/18/15 1819    Subjective PT reports no pain or changes.  Dressing intact, however slid some.    Patient and Family Stated Goals wound to heal    Date of Onset 02/19/15   Prior Treatments self treat, now on antibiotics.    Wound Properties Date First Assessed: 03/10/15 Time First Assessed: 0938 Wound Type: Diabetic ulcer Location: Foot , plantar aspect of 1st MT winds into plantar of Great toe.     Dressing Type Gauze (Comment);Silver hydrofiber  secured with medipore and covered with coban around forefoot   Dressing Changed Changed   Dressing Status Dry;Intact   Dressing Change Frequency PRN   Site / Wound Assessment Brown;Dusky;Granulation tissue   % Wound base Red or Granulating 40%   % Wound base Other (Comment) 60%   Margins Epibole (rolled edges)   Drainage Amount Minimal   Drainage Description Serous   Treatment Cleansed;Debridement (Selective)   Wound Properties Date First Assessed: 03/10/15 Time First Assessed: 0935 Wound Type: Diabetic ulcer Location: Toe (Comment  which one) , 4th  Location Orientation: Left , plantar aspect   Present on Admission: No  Dressing Type Silver hydrofiber;Gauze (Comment)   Dressing Changed Changed   Dressing Status Dry;Intact   Dressing Change Frequency PRN   Site / Wound Assessment Dry;Pale;Pink   % Wound base Red or Granulating 40%   % Wound base Yellow 60%   Drainage Amount Scant   Drainage Description Serous;Odor   Treatment Cleansed;Debridement (Selective)   Selective Debridement - Location Both wounds   Selective Debridement - Tools Used Forceps;Scalpel   Selective Debridement - Tissue Removed slough and callous   Wound Therapy - Clinical Statement Removed hardened edges of wounds with scapel to promote approximation.  Secured  bandage with medipore tape prior to wrapping to ensure bandage would not slide making a blister.  Pt reported comfort with bandages at end of session with good fit of shoe.    Wound Therapy - Functional Problem List dificulty walking    Factors Delaying/Impairing Wound Healing Altered sensation;Diabetes Mellitus;Infection - systemic/local;Multiple medical problems;Vascular compromise   Hydrotherapy Plan Debridement;Dressing change;Patient/family education   Wound Therapy - Frequency 3X / week   Wound Therapy - Current Recommendations PT   Wound Plan Pt to be seen three times a week for the first week; if maceration is gone decrease to twice a week for five addictional weeks.    Dressing  vaseline to perimeter followed by silver hydrofiber to wound sites; 2x2 followed by 2" kling.             Problem List Patient Active Problem List   Diagnosis Date Noted  . Delirium 01/13/2014  . Diastolic CHF, acute on chronic (HCC) 01/13/2014  . Expressive aphasia 01/13/2014  . Acute encephalopathy 03/16/2013  . Acute on chronic renal failure ([place]) 03/16/2013  . Hypoglycemia 03/16/2013  . CHF (congestive heart failure) ([place])   . Unstable balance 01/16/2013  . Difficulty in walking(719.7) 01/16/2013  . Infected wound ([place]) 01/16/2013  . Hx of right BKA ([place]) 12/23/2012  . Candidiasis of breast 12/08/2012  . S/P bilateral BKA (below knee amputation) ([place]) 11/30/2012  . Diabetes ([place]) 11/30/2012  . Diabetic foot ulcer ([place]) 10/08/2012  . UTI (lower urinary tract infection) 10/07/2012  . Bacteremia due to Staphylococcus aureus 10/07/2012  . Anxiety 02/17/2012  . Osteomyelitis of right foot ([place]) 02/14/2012  . Hematuria, microscopic 02/13/2012  . Hyponatremia 02/13/2012  . PVD (peripheral vascular disease) ([place]) 02/08/2012  . Open wound of heel 02/08/2012  . Hyperlipidemia 11/11/2011  . Hypertension 11/11/2011  . Morbid obesity ([place]) 11/11/2011  . Tachycardia 08/04/2011  . Acute  exacerbation of CHF (congestive heart failure) ([place]) 07/25/2011  . Diabetes mellitus with neuropathy 07/25/2011  . OSA (obstructive sleep apnea) 07/25/2011  . Acute bronchitis 07/25/2011  . Generalized weakness 07/25/2011  . Acute respiratory failure ([address]) 07/25/2011  . Restless leg syndrome 07/25/2011  . Anemia 07/25/2011    Teena Irani, PTA/CLT [phone] 03/18/2015, 6:35 PM  [facility] [address] [place], [place], [address] Phone: [phone]   Fax:  [phone]  Name: MESHIA CRAPPS MRN: [id number] Date of Birth: 10/25/1945

## 2015-03-20 ENCOUNTER — Ambulatory Visit (HOSPITAL_COMMUNITY): Payer: PPO | Admitting: Physical Therapy

## 2015-03-20 DIAGNOSIS — T148 Other injury of unspecified body region: Secondary | ICD-10-CM | POA: Diagnosis not present

## 2015-03-20 DIAGNOSIS — T148XXA Other injury of unspecified body region, initial encounter: Secondary | ICD-10-CM

## 2015-03-20 DIAGNOSIS — T798XXS Other early complications of trauma, sequela: Secondary | ICD-10-CM

## 2015-03-20 DIAGNOSIS — R600 Localized edema: Secondary | ICD-10-CM

## 2015-03-20 NOTE — Therapy (Signed)
[facility] [place], [place], [address] Phone: [phone]   Fax:  [phone]  Wound Care Therapy  Patient Details  Name: Mary [name] MRN: [id number] Date of Birth: 1946/05/04 No Data Recorded  Encounter Date: 03/20/2015      PT End of Session - 03/20/15 0940    Visit Number 4   Number of Visits 13   Date for PT Re-Evaluation 04/09/15   Authorization Type [employer] Medicare   Authorization - Visit Number 3   Authorization - Number of Visits 10   PT Start Time 0803   PT Stop Time 0840   PT Time Calculation (min) 37 min   Activity Tolerance Patient tolerated treatment well      Past Medical History  Diagnosis Date  . CHF (congestive heart failure)   . Diabetes mellitus   . Restless leg syndrome 07/25/2011  . Wound, open     right foot  . Diabetes mellitus with neuropathy 07/25/2011  . Cellulitis and abscess of foot 02/12/2012  . Osteomyelitis of right foot 02/14/2012  . Partial Achilles tendon tear 02/14/2012  . Dysrhythmia     tachy- takes Metoprolol  . OSA (obstructive sleep apnea) 07/25/2011    not using CPAP  . Arthritis     knees  . Anemia   . Complication of anesthesia     pt states after her hysterectomy the doctor said she was "wild" when she woke up     Past Surgical History  Procedure Laterality Date  . Abdominal hysterectomy    . Achilles tendon repair    . Toe amputation      partial rt great toe  . Below knee leg amputation Right 11/25/2012    Dr Sharol Given  . Amputation Right 11/25/2012    Procedure: AMPUTATION BELOW KNEE;  Surgeon: Newt Minion, MD;  Location: [facility];  Service: Orthopedics;  Laterality: Right;  Right Below Knee Amputation  . Breast surgery Left     Lumpectomy non ca  . Cataract extraction w/phaco Right 10/09/2013    Procedure: CATARACT EXTRACTION PHACO AND INTRAOCULAR LENS PLACEMENT (IOC);  Surgeon: Elta Guadeloupe T. Gershon Crane, MD;  Location[facility];  Service: Ophthalmology;  Laterality:  Right;  CDE:9.05  . Cataract extraction w/phaco Left 10/23/2013    Procedure: ATTEMPTED CATARACT EXTRACTION PHACO AND INTRAOCULAR LENS PLACEMENT;  Surgeon: Elta Guadeloupe T. Gershon Crane, MD;  Location: [facility];  Service: Ophthalmology;  Laterality: Left;  CDE:  4.41    There were no vitals filed for this visit.  Visit Diagnosis:  Nonhealing nonsurgical wound  Infected wound, sequela  Edema of foot                 Wound Therapy - 03/20/15 0942    Subjective PT reports no pain states the dressing came off and her husband replaced with "a lot" of tape.   Patient and Family Stated Goals wound to heal    Date of Onset 02/19/15   Prior Treatments self treat, now on antibiotics.    Wound Properties Date First Assessed: 03/10/15 Time First Assessed: 0938 Wound Type: Diabetic ulcer Location: Foot , plantar aspect of 1st MT winds into plantar of Great toe.     Dressing Type Gauze (Comment);Silver hydrofiber  secured with medipore and covered with coban around forefoot   Dressing Status Dry;Intact   Dressing Change Frequency PRN   Site / Wound Assessment Dusky;Granulation tissue   % Wound base Red or Granulating 50%   %  Wound base Other (Comment) 50%   Margins Epibole (rolled edges)   Drainage Amount Minimal   Drainage Description Serous   Wound Properties Date First Assessed: 03/10/15 Time First Assessed: 0935 Wound Type: Diabetic ulcer Location: Toe (Comment  which one) , 4th  Location Orientation: Left , plantar aspect   Present on Admission: No   Dressing Type Silver hydrofiber;Gauze (Comment)   Dressing Status Dry;Intact   Dressing Change Frequency PRN   Site / Wound Assessment Dry;Pale;Pink   % Wound base Red or Granulating 50%   % Wound base Yellow 50%   Drainage Amount Scant   Drainage Description Serous;Odor   Selective Debridement - Location Both wounds   Selective Debridement - Tools Used Forceps;Scalpel   Selective Debridement - Tissue Removed slough and callous   Wound Therapy  - Clinical Statement Pt wound no longer macerated but edges of plantar woound epiboled debridement to attempt to rectify.  Toe wound continues to undermine.    Wound Therapy - Functional Problem List dificulty walking    Factors Delaying/Impairing Wound Healing Altered sensation;Diabetes Mellitus;Infection - systemic/local;Multiple medical problems;Vascular compromise   Hydrotherapy Plan Debridement;Dressing change;Patient/family education   Wound Therapy - Frequency 3X / week   Wound Therapy - Current Recommendations PT   Wound Plan Pt to be seen three times a week for the first week; if maceration is gone decrease to twice a week for five addictional weeks.    Dressing  vaseline to perimeter followed by silver hydrofiber to wound sites; 2x2 followed by 3" kling.    Dressing toe cap to toe to keep dressing in place.    Decrease Necrotic Tissue to STG:   2 weeks 0   Decrease Necrotic Tissue - Progress Progressing toward goal   Increase Granulation Tissue to STG:  2 weeks 100   Increase Granulation Tissue - Progress Progressing toward goal   Decrease Length/Width/Depth by (cm) STG:  3 weeks:  decrease both wound sites by 50%;  LTG:  6 weeks:  Decrease both wound sites by 75 %    Decrease Length/Width/Depth - Progress Progressing toward goal   Patient/Family will be able to  I in dressing change   Patient/Family Instruction Goal - Progress Met   Additional Wound Therapy Goal PT to verbalize the importance of maintaining good blood sugars for optimal healing.    Additional Wound Therapy Goal - Progress Progressing toward goal            03/20/15 0942  Wound Therapy Goals - Improve the function of patient's integumentary system by progressing the wound(s) through the phases of wound healing by:  Decrease Necrotic Tissue to STG:   2 weeks 0  Decrease Necrotic Tissue - Progress Progressing toward goal  Increase Granulation Tissue to STG:  2 weeks 100  Increase Granulation Tissue - Progress  Progressing toward goal  Decrease Length/Width/Depth by (cm) STG:  3 weeks:  decrease both wound sites by 50%;  LTG:  6 weeks:  Decrease both wound sites by 75 %   Decrease Length/Width/Depth - Progress Progressing toward goal  Patient/Family will be able to  I in dressing change  Patient/Family Instruction Goal - Progress Met  Additional Wound Therapy Goal PT to verbalize the importance of maintaining good blood sugars for optimal healing.   Additional Wound Therapy Goal - Progress Progressing toward goal                        Problem List Patient Active  Problem List   Diagnosis Date Noted  . Delirium 01/13/2014  . Diastolic CHF, acute on chronic (HCC) 01/13/2014  . Expressive aphasia 01/13/2014  . Acute encephalopathy 03/16/2013  . Acute on chronic renal failure ([place]) 03/16/2013  . Hypoglycemia 03/16/2013  . CHF (congestive heart failure) ([place])   . Unstable balance 01/16/2013  . Difficulty in walking(719.7) 01/16/2013  . Infected wound ([place]) 01/16/2013  . Hx of right BKA ([place]) 12/23/2012  . Candidiasis of breast 12/08/2012  . S/P bilateral BKA (below knee amputation) ([place]) 11/30/2012  . Diabetes ([place]) 11/30/2012  . Diabetic foot ulcer ([place]) 10/08/2012  . UTI (lower urinary tract infection) 10/07/2012  . Bacteremia due to Staphylococcus aureus 10/07/2012  . Anxiety 02/17/2012  . Osteomyelitis of right foot ([place]) 02/14/2012  . Hematuria, microscopic 02/13/2012  . Hyponatremia 02/13/2012  . PVD (peripheral vascular disease) ([place]) 02/08/2012  . Open wound of heel 02/08/2012  . Hyperlipidemia 11/11/2011  . Hypertension 11/11/2011  . Morbid obesity ([place]) 11/11/2011  . Tachycardia 08/04/2011  . Acute exacerbation of CHF (congestive heart failure) ([place]) 07/25/2011  . Diabetes mellitus with neuropathy 07/25/2011  . OSA (obstructive sleep apnea) 07/25/2011  . Acute bronchitis 07/25/2011  . Generalized weakness 07/25/2011  . Acute respiratory failure ([place])  07/25/2011  . Restless leg syndrome 07/25/2011  . Anemia 07/25/2011   Rayetta Humphrey, PT CLT [phone] 03/20/2015, 9:45 AM  [facility] [address] [place], [place], [address] Phone: [phone]   Fax:  [phone]  Name: WAVERLY TARQUINIO MRN: [id number] Date of Birth: 26-Nov-1945

## 2015-03-25 ENCOUNTER — Ambulatory Visit (HOSPITAL_COMMUNITY): Payer: PPO

## 2015-03-27 ENCOUNTER — Ambulatory Visit (HOSPITAL_COMMUNITY): Payer: PPO

## 2015-04-01 ENCOUNTER — Ambulatory Visit (HOSPITAL_COMMUNITY): Payer: PPO | Attending: Internal Medicine

## 2015-04-01 ENCOUNTER — Telehealth (HOSPITAL_COMMUNITY): Payer: Self-pay

## 2015-04-01 DIAGNOSIS — S81801A Unspecified open wound, right lower leg, initial encounter: Secondary | ICD-10-CM | POA: Diagnosis present

## 2015-04-01 DIAGNOSIS — S81001A Unspecified open wound, right knee, initial encounter: Secondary | ICD-10-CM | POA: Diagnosis present

## 2015-04-01 DIAGNOSIS — T148 Other injury of unspecified body region: Secondary | ICD-10-CM | POA: Insufficient documentation

## 2015-04-01 DIAGNOSIS — S91001A Unspecified open wound, right ankle, initial encounter: Secondary | ICD-10-CM | POA: Diagnosis present

## 2015-04-01 DIAGNOSIS — R2689 Other abnormalities of gait and mobility: Secondary | ICD-10-CM

## 2015-04-01 DIAGNOSIS — T798XXD Other early complications of trauma, subsequent encounter: Secondary | ICD-10-CM

## 2015-04-01 DIAGNOSIS — T798XXS Other early complications of trauma, sequela: Secondary | ICD-10-CM | POA: Diagnosis present

## 2015-04-01 DIAGNOSIS — R600 Localized edema: Secondary | ICD-10-CM

## 2015-04-01 DIAGNOSIS — T148XXA Other injury of unspecified body region, initial encounter: Secondary | ICD-10-CM

## 2015-04-01 NOTE — Therapy (Signed)
[facility] [place], [place], [address] Phone: [phone]   Fax:  [phone]  Wound Care Therapy  Patient Details  Name: Mary [name] MRN: [id number] Date of Birth: 07-01-1945 No Data Recorded  Encounter Date: 04/01/2015      PT End of Session - 04/01/15 0908    Visit Number 5   Number of Visits 13   Date for PT Re-Evaluation 04/09/15   Authorization Type [employer] Medicare   Authorization - Visit Number 5   Authorization - Number of Visits 10   PT Start Time 0803   PT Stop Time 0848   PT Time Calculation (min) 45 min   Activity Tolerance Patient tolerated treatment well   Behavior During Therapy [facility] for tasks assessed/performed      Past Medical History  Diagnosis Date  . CHF (congestive heart failure)   . Diabetes mellitus   . Restless leg syndrome 07/25/2011  . Wound, open     right foot  . Diabetes mellitus with neuropathy 07/25/2011  . Cellulitis and abscess of foot 02/12/2012  . Osteomyelitis of right foot 02/14/2012  . Partial Achilles tendon tear 02/14/2012  . Dysrhythmia     tachy- takes Metoprolol  . OSA (obstructive sleep apnea) 07/25/2011    not using CPAP  . Arthritis     knees  . Anemia   . Complication of anesthesia     pt states after her hysterectomy the doctor said she was "wild" when she woke up     Past Surgical History  Procedure Laterality Date  . Abdominal hysterectomy    . Achilles tendon repair    . Toe amputation      partial rt great toe  . Below knee leg amputation Right 11/25/2012    Dr Sharol Given  . Amputation Right 11/25/2012    Procedure: AMPUTATION BELOW KNEE;  Surgeon: Newt Minion, MD;  Location: [facility];  Service: Orthopedics;  Laterality: Right;  Right Below Knee Amputation  . Breast surgery Left     Lumpectomy non ca  . Cataract extraction w/phaco Right 10/09/2013    Procedure: CATARACT EXTRACTION PHACO AND INTRAOCULAR LENS PLACEMENT (IOC);  Surgeon: Elta Guadeloupe T. Gershon Crane, MD;   Location[facility];  Service: Ophthalmology;  Laterality: Right;  CDE:9.05  . Cataract extraction w/phaco Left 10/23/2013    Procedure: ATTEMPTED CATARACT EXTRACTION PHACO AND INTRAOCULAR LENS PLACEMENT;  Surgeon: Elta Guadeloupe T. Gershon Crane, MD;  Location[facility];  Service: Ophthalmology;  Laterality: Left;  CDE:  4.41    There were no vitals filed for this visit.  Visit Diagnosis:  Nonhealing nonsurgical wound  Infected wound, sequela  Edema of foot  Open wound of knee, leg (except thigh), and ankle, complicated, right, initial encounter  Unstable balance  Infected wound, subsequent encounter      Subjective Assessment - 04/01/15 0856    Subjective Pt no treatment last week, reported she had virus, stated husband has been covering dressings though entered today with no dressings on wound.  Reports pain on end of Rt limb with reports of redness, pain scale 7/10 when NWB.   Currently in Pain? Yes   Pain Score 7    Pain Location Leg   Pain Orientation Right;Anterior                   Wound Therapy - 04/01/15 0856    Subjective Pt no treatment last week, reported she had virus, stated husband has been covering dressings  though entered today with no dressings on wound.  Reports pain on end of Rt limb with reports of redness, pain scale 7/10 when NWB.   Patient and Family Stated Goals wound to heal    Date of Onset 02/19/15   Prior Treatments self treat, now on antibiotics.    Wound Properties Date First Assessed: 03/10/15 Time First Assessed: 0938 Wound Type: Diabetic ulcer Location: Foot , plantar aspect of 1st MT winds into plantar of Great toe.     Dressing Type Gauze (Comment);Silver hydrofiber  silver hydrofiber, 2x2 and gauze   Dressing Status Dry;Intact   Dressing Change Frequency PRN   Site / Wound Assessment Dusky;Granulation tissue   % Wound base Red or Granulating 50%   % Wound base Other (Comment) 50%  callous perimeter   Wound Length (cm) 1.1 cm   Wound Width (cm) 1  cm   Wound Depth (cm) 0.2 cm   Margins Epibole (rolled edges)   Drainage Amount Minimal   Drainage Description Serous   Treatment Cleansed;Debridement (Selective)   Wound Properties Date First Assessed: 03/10/15 Time First Assessed: 0935 Wound Type: Diabetic ulcer Location: Toe (Comment  which one) , 4th  Location Orientation: Left , plantar aspect   Present on Admission: No   Dressing Type Silver hydrofiber;Gauze (Comment)   Dressing Status Dry;Intact   Dressing Change Frequency PRN   Site / Wound Assessment Dry;Pale;Pink   % Wound base Red or Granulating 50%   % Wound base Yellow 50%   Wound Length (cm) 0.2 cm   Wound Width (cm) 0.4 cm   Drainage Amount Scant   Drainage Description Serous;Odor   Treatment Cleansed;Debridement (Selective)   Selective Debridement - Location Both wounds   Selective Debridement - Tools Used Forceps;Scalpel   Selective Debridement - Tissue Removed slough and callous   Wound Therapy - Clinical Statement Pt with increased callouses surrounding perimeter of wound following no treatment last week due to weakness.  Continued with silver hydrofiber with vaseline perimeter to promote healing.  Measurements taken today with signs of increased approximation.  Assess Rt limb following report of increased pain and redness with noted opening and callouses Rt limb.  Referral sent to MD for wound treatment to new wound.  Pt encouraged to remove prostetic limb as soon as home and non weight bearing to reduce irritation to extremity.     Wound Therapy - Functional Problem List dificulty walking    Factors Delaying/Impairing Wound Healing Altered sensation;Diabetes Mellitus;Infection - systemic/local;Multiple medical problems;Vascular compromise   Hydrotherapy Plan Debridement;Dressing change;Patient/family education   Wound Therapy - Frequency 3X / week   Wound Therapy - Current Recommendations PT   Wound Plan F/U on referral for Rt LE, evaluate new wound next session if  signed referral.  Continue approximate dressings to Lt LE.   Dressing  vaseline to perimeter followed by silver hydrofiber to wound sites; 2x2 followed by 3" kling.           Problem List Patient Active Problem List   Diagnosis Date Noted  . Delirium 01/13/2014  . Diastolic CHF, acute on chronic (HCC) 01/13/2014  . Expressive aphasia 01/13/2014  . Acute encephalopathy 03/16/2013  . Acute on chronic renal failure ([place]) 03/16/2013  . Hypoglycemia 03/16/2013  . CHF (congestive heart failure) ([place])   . Unstable balance 01/16/2013  . Difficulty in walking(719.7) 01/16/2013  . Infected wound ([place]) 01/16/2013  . Hx of right BKA ([place]) 12/23/2012  . Candidiasis of breast 12/08/2012  . S/P bilateral  BKA (below knee amputation) ([place]) 11/30/2012  . Diabetes ([place]) 11/30/2012  . Diabetic foot ulcer ([place]) 10/08/2012  . UTI (lower urinary tract infection) 10/07/2012  . Bacteremia due to Staphylococcus aureus 10/07/2012  . Anxiety 02/17/2012  . Osteomyelitis of right foot ([place]) 02/14/2012  . Hematuria, microscopic 02/13/2012  . Hyponatremia 02/13/2012  . PVD (peripheral vascular disease) ([place]) 02/08/2012  . Open wound of heel 02/08/2012  . Hyperlipidemia 11/11/2011  . Hypertension 11/11/2011  . Morbid obesity ([place]) 11/11/2011  . Tachycardia 08/04/2011  . Acute exacerbation of CHF (congestive heart failure) ([place]) 07/25/2011  . Diabetes mellitus with neuropathy 07/25/2011  . OSA (obstructive sleep apnea) 07/25/2011  . Acute bronchitis 07/25/2011  . Generalized weakness 07/25/2011  . Acute respiratory failure ([place]) 07/25/2011  . Restless leg syndrome 07/25/2011  . Anemia 07/25/2011   Ihor Austin, [place]; [place]  Aldona Lento 04/01/2015, 9:10 AM  [facility] [place], [place], [address] Phone: [phone]   Fax:  [phone]  Name: LISAMARIE DEBOIS MRN: [id number] Date of Birth: 1946-02-21

## 2015-04-03 ENCOUNTER — Ambulatory Visit (HOSPITAL_COMMUNITY): Payer: PPO | Admitting: Physical Therapy

## 2015-04-08 ENCOUNTER — Ambulatory Visit (HOSPITAL_COMMUNITY): Payer: PPO | Admitting: Physical Therapy

## 2015-04-08 DIAGNOSIS — R2689 Other abnormalities of gait and mobility: Secondary | ICD-10-CM

## 2015-04-08 DIAGNOSIS — T148XXA Other injury of unspecified body region, initial encounter: Secondary | ICD-10-CM

## 2015-04-08 DIAGNOSIS — R600 Localized edema: Secondary | ICD-10-CM

## 2015-04-08 DIAGNOSIS — S81001A Unspecified open wound, right knee, initial encounter: Secondary | ICD-10-CM

## 2015-04-08 DIAGNOSIS — T798XXS Other early complications of trauma, sequela: Secondary | ICD-10-CM

## 2015-04-08 DIAGNOSIS — T798XXD Other early complications of trauma, subsequent encounter: Secondary | ICD-10-CM

## 2015-04-08 DIAGNOSIS — S91001A Unspecified open wound, right ankle, initial encounter: Secondary | ICD-10-CM

## 2015-04-08 DIAGNOSIS — T148 Other injury of unspecified body region: Secondary | ICD-10-CM | POA: Diagnosis not present

## 2015-04-08 DIAGNOSIS — S81801A Unspecified open wound, right lower leg, initial encounter: Secondary | ICD-10-CM

## 2015-04-08 NOTE — Therapy (Addendum)
[place] [address] [place], [place], [address] Phone: [phone]   Fax:  [phone]  Wound Care Therapy  Patient Details  Name: Mary [name] MRN: [id number] Date of Birth: 1945/11/23 No Data Recorded  Encounter Date: 04/08/2015 Wound re-measured and reassessed.      PT End of Session - 04/08/15 0955    Visit Number 6   Number of Visits 13   Date for PT Re-Evaluation 05/08/2011   Authorization - Visit Number 6   Authorization - Number of Visits 10   PT Start Time 0803   PT Stop Time 0843   PT Time Calculation (min) 40 min   Activity Tolerance Patient tolerated treatment well   Behavior During Therapy [facility] for tasks assessed/performed      Past Medical History  Diagnosis Date  . CHF (congestive heart failure)   . Diabetes mellitus   . Restless leg syndrome 07/25/2011  . Wound, open     right foot  . Diabetes mellitus with neuropathy 07/25/2011  . Cellulitis and abscess of foot 02/12/2012  . Osteomyelitis of right foot 02/14/2012  . Partial Achilles tendon tear 02/14/2012  . Dysrhythmia     tachy- takes Metoprolol  . OSA (obstructive sleep apnea) 07/25/2011    not using CPAP  . Arthritis     knees  . Anemia   . Complication of anesthesia     pt states after her hysterectomy the doctor said she was "wild" when she woke up     Past Surgical History  Procedure Laterality Date  . Abdominal hysterectomy    . Achilles tendon repair    . Toe amputation      partial rt great toe  . Below knee leg amputation Right 11/25/2012    Dr Sharol Given  . Amputation Right 11/25/2012    Procedure: AMPUTATION BELOW KNEE;  Surgeon: Newt Minion, MD;  Location: [place];  Service: Orthopedics;  Laterality: Right;  Right Below Knee Amputation  . Breast surgery Left     Lumpectomy non ca  . Cataract extraction w/phaco Right 10/09/2013    Procedure: CATARACT EXTRACTION PHACO AND INTRAOCULAR LENS PLACEMENT (IOC);  Surgeon: Elta Guadeloupe T. Gershon Crane, MD;   [facility];  Service: Ophthalmology;  Laterality: Right;  CDE:9.05  . Cataract extraction w/phaco Left 10/23/2013    Procedure: ATTEMPTED CATARACT EXTRACTION PHACO AND INTRAOCULAR LENS PLACEMENT;  Surgeon: Elta Guadeloupe T. Gershon Crane, MD;  Location: [facility];  Service: Ophthalmology;  Laterality: Left;  CDE:  4.41    There were no vitals filed for this visit.  Visit Diagnosis:  Nonhealing nonsurgical wound  Infected wound, sequela  Edema of foot  Open wound of knee, leg (except thigh), and ankle, complicated, right, initial encounter  Unstable balance  Infected wound, subsequent encounter      Subjective Assessment - 04/08/15 0938    Subjective Pt states that her Rt leg is not bothering her as much as it was last week.  Pt has not changed the bandage on her Lt foot.    Currently in Pain? No/denies                   Wound Therapy - 04/08/15 0939    Subjective Pt states that her Lt leg wound is not bothering her as much as it was.  States her sugar is good at 200.   Patient and Family Stated Goals wound to heal    Date of Onset 02/19/15   Prior  Treatments self treat, now on antibiotics.    Pain Score 0-No pain   Wound Properties Date First Assessed: 03/10/15 Time First Assessed: 0938 Wound Type: Diabetic ulcer Location: Foot , plantar aspect of 1st MT winds into plantar of Great toe.     Dressing Type Gauze (Comment)  medihoney to plantar aspect followed by 4x4 and gauze.   Dressing Changed Changed   Dressing Status Dry   Dressing Change Frequency PRN   Site / Wound Assessment Dusky   % Wound base Red or Granulating 50%   % Wound base Yellow 50%   Wound Length (cm) 0.8 cm   Wound Width (cm) 0.8 cm   Wound Depth (cm) 0.2 cm   Margins Epibole (rolled edges)   Drainage Amount Minimal   Drainage Description Serous   Treatment Cleansed;Debridement (Selective);Other (Comment)  moisturized and dressing change.    Wound Properties Date First Assessed: 03/10/15 Time First  Assessed: 0935 Wound Type: Diabetic ulcer Location: Toe (Comment  which one) , 4th  Location Orientation: Left , plantar aspect   Present on Admission: No   Dressing Type Gauze (Comment)   Dressing Changed Changed  moistened with carraklenz and packed as able    Dressing Status Clean   % Wound base Red or Granulating 60%   % Wound base Yellow 40%   Wound Length (cm) 0.3 cm   Wound Width (cm) 0.3 cm   Undermining (cm) .5   Drainage Amount Scant   Drainage Description Serous   Treatment Cleansed;Debridement (Selective)   Selective Debridement - Location Both wounds   Selective Debridement - Tools Used Forceps   Selective Debridement - Tissue Removed slough; epiboled edges    Wound Therapy - Clinical Statement Pt counseled that 200 is not a good blood sugar and that everyone's blood sugars should be down arong 100.  Pt wound on Rt anterior LE is 1.3 x .2 with no drainage.  No skilled therapy needed pt was educated in proper care.  It was noted when theapist took prothesis off that there was a distinct smell.  Pt was "washing" sleeve with saline.  Explained that saline has no antibacterial properties and that pt should contact the company she aquired the prothesis from to inquire what they suggest she cleanse her sleeve and leg with as it should not be smelling which is a sign of bacteria.  Pt fouth toe on her left LE is red.  Pt states she has no more antibiotics and that her toe was not red before.  Pt urged to contact MD and aquire antibiotic.  Wound on 4th toe had healed from the outside and was broke open by forceps.  Wound continues to tunnel underneath.    Wound Therapy - Functional Problem List difficulty walking    Factors Delaying/Impairing Wound Healing Altered sensation;Diabetes Mellitus;Infection - systemic/local;Multiple medical problems;Vascular compromise   Hydrotherapy Plan Debridement;Dressing change;Patient/family education   Wound Therapy - Frequency 3X / week   Wound Therapy -  Current Recommendations PT   Wound Plan No need for skilled intervention on Rt LE    Dressing  moisturize followed by honey on plantar wound; toe wound packed with moist to moist gauze    Decrease Necrotic Tissue to STG:   2 weeks 0   Decrease Necrotic Tissue - Progress Progressing toward goal   Increase Granulation Tissue to STG:  2 weeks 100   Increase Granulation Tissue - Progress Progressing toward goal   Decrease Length/Width/Depth by (cm) STG:  3  weeks:  decrease both wound sites by 50%;  LTG:  6 weeks:  Decrease both wound sites by 75 %    Decrease Length/Width/Depth - Progress Progressing toward goal   Patient/Family will be able to  I in dressing change   Patient/Family Instruction Goal - Progress Met   Additional Wound Therapy Goal PT to verbalize the importance of maintaining good blood sugars for optimal healing.    Additional Wound Therapy Goal - Progress Not met                 PT Education - 04/08/15 0954    Education provided Yes   Education Details What an optimal blood sugar is, the importance of keeping LE moisturized; The importance of keeping sleeve and prothesis clean and bacteria free.    Person(s) Educated Patient   Methods Explanation   Comprehension Verbalized understanding                     Problem List Patient Active Problem List   Diagnosis Date Noted  . Delirium 01/13/2014  . Diastolic CHF, acute on chronic (HCC) 01/13/2014  . Expressive aphasia 01/13/2014  . Acute encephalopathy 03/16/2013  . Acute on chronic renal failure ([place]) 03/16/2013  . Hypoglycemia 03/16/2013  . CHF (congestive heart failure) ([place])   . Unstable balance 01/16/2013  . Difficulty in walking(719.7) 01/16/2013  . Infected wound ([place]) 01/16/2013  . Hx of right BKA ([place]) 12/23/2012  . Candidiasis of breast 12/08/2012  . S/P bilateral BKA (below knee amputation) ([place]) 11/30/2012  . Diabetes ([place]) 11/30/2012  . Diabetic foot ulcer ([place]) 10/08/2012  .  UTI (lower urinary tract infection) 10/07/2012  . Bacteremia due to Staphylococcus aureus 10/07/2012  . Anxiety 02/17/2012  . Osteomyelitis of right foot ([place]) 02/14/2012  . Hematuria, microscopic 02/13/2012  . Hyponatremia 02/13/2012  . PVD (peripheral vascular disease) ([place]) 02/08/2012  . Open wound of heel 02/08/2012  . Hyperlipidemia 11/11/2011  . Hypertension 11/11/2011  . Morbid obesity ([place]) 11/11/2011  . Tachycardia 08/04/2011  . Acute exacerbation of CHF (congestive heart failure) ([place]) 07/25/2011  . Diabetes mellitus with neuropathy 07/25/2011  . OSA (obstructive sleep apnea) 07/25/2011  . Acute bronchitis 07/25/2011  . Generalized weakness 07/25/2011  . Acute respiratory failure ([place]) 07/25/2011  . Restless leg syndrome 07/25/2011  . Anemia 07/25/2011  Rayetta Humphrey, PT CLT [phone] 04/08/2015, 9:57 AM  [name] [address] [place], [place], [address] Phone: [phone]   Fax:  [phone]  Name: Mary [name] MRN: [id number] Date of Birth: 25-Nov-1945

## 2015-04-09 NOTE — Telephone Encounter (Signed)
Called concerning apt date and time  Casey Cockerham, LPTA; CBIS [phone]  

## 2015-04-10 ENCOUNTER — Ambulatory Visit (HOSPITAL_COMMUNITY): Payer: PPO

## 2015-04-10 DIAGNOSIS — S91001A Unspecified open wound, right ankle, initial encounter: Secondary | ICD-10-CM

## 2015-04-10 DIAGNOSIS — T148XXA Other injury of unspecified body region, initial encounter: Secondary | ICD-10-CM

## 2015-04-10 DIAGNOSIS — S81801A Unspecified open wound, right lower leg, initial encounter: Secondary | ICD-10-CM

## 2015-04-10 DIAGNOSIS — T798XXS Other early complications of trauma, sequela: Secondary | ICD-10-CM

## 2015-04-10 DIAGNOSIS — T148 Other injury of unspecified body region: Secondary | ICD-10-CM | POA: Diagnosis not present

## 2015-04-10 DIAGNOSIS — R2689 Other abnormalities of gait and mobility: Secondary | ICD-10-CM

## 2015-04-10 DIAGNOSIS — R600 Localized edema: Secondary | ICD-10-CM

## 2015-04-10 DIAGNOSIS — T798XXD Other early complications of trauma, subsequent encounter: Secondary | ICD-10-CM

## 2015-04-10 DIAGNOSIS — S81001A Unspecified open wound, right knee, initial encounter: Secondary | ICD-10-CM

## 2015-04-10 NOTE — Addendum Note (Signed)
Addended by: Leeroy Cha on: 04/10/2015 01:22 PM   Modules accepted: Orders

## 2015-04-10 NOTE — Addendum Note (Signed)
Addended by: Leeroy Cha on: 04/10/2015 01:15 PM   Modules accepted: Orders

## 2015-04-10 NOTE — Therapy (Signed)
[place], [place], [address] Phone: [phone]   Fax:  [phone]  Physical Therapy Treatment  Patient Details  Name: Mary [name] MRN: [id number] Date of Birth: Feb 14, 1946 No Data Recorded  Encounter Date: 04/10/2015      PT End of Session - 04/10/15 1301    Visit Number 7   Number of Visits 13   Date for PT Re-Evaluation 05/10/15   Authorization Type [employer] Medicare   Authorization - Visit Number 7   Authorization - Number of Visits 10   PT Start Time 0806   PT Stop Time 0845   PT Time Calculation (min) 39 min   Activity Tolerance Patient tolerated treatment well   Behavior During Therapy [facility] for tasks assessed/performed      Past Medical History  Diagnosis Date  . CHF (congestive heart failure)   . Diabetes mellitus   . Restless leg syndrome 07/25/2011  . Wound, open     right foot  . Diabetes mellitus with neuropathy 07/25/2011  . Cellulitis and abscess of foot 02/12/2012  . Osteomyelitis of right foot 02/14/2012  . Partial Achilles tendon tear 02/14/2012  . Dysrhythmia     tachy- takes Metoprolol  . OSA (obstructive sleep apnea) 07/25/2011    not using CPAP  . Arthritis     knees  . Anemia   . Complication of anesthesia     pt states after her hysterectomy the doctor said she was "wild" when she woke up     Past Surgical History  Procedure Laterality Date  . Abdominal hysterectomy    . Achilles tendon repair    . Toe amputation      partial rt great toe  . Below knee leg amputation Right 11/25/2012    Dr Sharol Given  . Amputation Right 11/25/2012    Procedure: AMPUTATION BELOW KNEE;  Surgeon: Newt Minion, MD;  Location: [place];  Service: Orthopedics;  Laterality: Right;  Right Below Knee Amputation  . Breast surgery Left     Lumpectomy non ca  . Cataract extraction w/phaco Right 10/09/2013    Procedure: CATARACT EXTRACTION PHACO AND INTRAOCULAR LENS PLACEMENT (IOC);  Surgeon: Elta Guadeloupe T.  Gershon Crane, MD;  Location[facility];  Service: Ophthalmology;  Laterality: Right;  CDE:9.05  . Cataract extraction w/phaco Left 10/23/2013    Procedure: ATTEMPTED CATARACT EXTRACTION PHACO AND INTRAOCULAR LENS PLACEMENT;  Surgeon: Elta Guadeloupe T. Gershon Crane, MD;  [facility];  Service: Ophthalmology;  Laterality: Left;  CDE:  4.41    There were no vitals filed for this visit.  Visit Diagnosis:  Nonhealing nonsurgical wound  Infected wound, sequela  Edema of foot  Open wound of knee, leg (except thigh), and ankle, complicated, right, initial encounter  Unstable balance  Infected wound, subsequent encounter      Subjective Assessment - 04/10/15 1208    Subjective Pt states she has changed the cleaning or prostetic limb sock, stated Rt LE is feeling a lot better following the pressure relief.  No reports of pain.     Currently in Pain? No/denies                   Wound Therapy - 04/10/15 1243    Subjective Pt states she has changed the cleaning supplies or prostetic limb sock, stated Rt LE is feeling a lot better following the pressure relief.  No reports of pain.     Patient and Family Stated Goals wound to heal  Date of Onset 02/19/15   Prior Treatments self treat, now on antibiotics.    Pain Assessment No/denies pain   Pain Score 0-No pain   Wound Properties Date First Assessed: 03/10/15 Time First Assessed: 0938 Wound Type: Diabetic ulcer Location: Foot , plantar aspect of 1st MT winds into plantar of Great toe.     Dressing Type Gauze (Comment)  medipore on plantar surface, 4x4 and gauze   Dressing Changed Changed   Dressing Status Dry   Dressing Change Frequency PRN   Site / Wound Assessment Dusky   % Wound base Red or Granulating 50%   % Wound base Yellow 50%   Wound Length (cm) 0.8 cm   Wound Width (cm) 0.8 cm   Wound Depth (cm) 0.2 cm   Margins Epibole (rolled edges)   Drainage Amount Minimal   Drainage Description Serous   Treatment Cleansed;Debridement  (Selective)   Wound Properties Date First Assessed: 03/10/15 Time First Assessed: 0935 Wound Type: Diabetic ulcer Location: Toe (Comment  which one) , 4th  Location Orientation: Left , plantar aspect   Present on Admission: No   Dressing Type Gauze (Comment)   Dressing Changed Changed   Dressing Status Clean   Dressing Change Frequency PRN   Site / Wound Assessment Dry;Pale;Pink   % Wound base Red or Granulating 60%   % Wound base Yellow 40%   Wound Length (cm) 0.3 cm   Wound Width (cm) 0.2 cm   Wound Depth (cm) --  unknown   Undermining (cm) .5   Drainage Amount Scant   Drainage Description Serous   Treatment Cleansed;Debridement (Selective)   Selective Debridement - Location Both wounds   Selective Debridement - Tools Used Forceps;Scalpel   Selective Debridement - Tissue Removed slough; epiboled edges    Wound Therapy - Clinical Statement Pt. encouraged to check blood sugar more regularly and to keep blood sugar goals at 100 mg/dL.  Selective debridement complete to Lt LE only.  Able to remove callous surrounding wound plantar surface to promote healing and packed wound on toe with moistened gauze.  Pt encouraged to keep eye on Rt LE with proper cleaning and to be more frequent checking blood sugar.  No reports of pain through session.     Wound Therapy - Functional Problem List difficulty walking    Factors Delaying/Impairing Wound Healing Altered sensation;Diabetes Mellitus;Infection - systemic/local;Multiple medical problems;Vascular compromise   Hydrotherapy Plan Debridement;Dressing change;Patient/family education   Wound Therapy - Frequency 3X / week   Wound Therapy - Current Recommendations PT   Wound Plan Continue wound care with approriate dressings to Lt LE.    Dressing  medipore on plantar surface, 4x4 and gauze          Problem List Patient Active Problem List   Diagnosis Date Noted  . Delirium 01/13/2014  . Diastolic CHF, acute on chronic (HCC) 01/13/2014  .  Expressive aphasia 01/13/2014  . Acute encephalopathy 03/16/2013  . Acute on chronic renal failure ([place]) 03/16/2013  . Hypoglycemia 03/16/2013  . CHF (congestive heart failure) ([place])   . Unstable balance 01/16/2013  . Difficulty in walking(719.7) 01/16/2013  . Infected wound ([place]) 01/16/2013  . Hx of right BKA ([place]) 12/23/2012  . Candidiasis of breast 12/08/2012  . S/P bilateral BKA (below knee amputation) ([place]) 11/30/2012  . Diabetes ([place]) 11/30/2012  . Diabetic foot ulcer ([place]) 10/08/2012  . UTI (lower urinary tract infection) 10/07/2012  . Bacteremia due to Staphylococcus aureus 10/07/2012  . Anxiety 02/17/2012  .  Osteomyelitis of right foot ([place]) 02/14/2012  . Hematuria, microscopic 02/13/2012  . Hyponatremia 02/13/2012  . PVD (peripheral vascular disease) ([place]) 02/08/2012  . Open wound of heel 02/08/2012  . Hyperlipidemia 11/11/2011  . Hypertension 11/11/2011  . Morbid obesity ([place]) 11/11/2011  . Tachycardia 08/04/2011  . Acute exacerbation of CHF (congestive heart failure) ([place]) 07/25/2011  . Diabetes mellitus with neuropathy 07/25/2011  . OSA (obstructive sleep apnea) 07/25/2011  . Acute bronchitis 07/25/2011  . Generalized weakness 07/25/2011  . Acute respiratory failure ([place]) 07/25/2011  . Restless leg syndrome 07/25/2011  . Anemia 07/25/2011   Ihor Austin, [place]; [place]  Aldona Lento 04/10/2015, 1:02 PM  [facility] [address] [place], [place], [address] Phone: [phone]   Fax:  [phone]  Name: LOVENIA PAPE MRN: [id number] Date of Birth: 1945/09/05

## 2015-04-15 ENCOUNTER — Ambulatory Visit (HOSPITAL_COMMUNITY): Payer: PPO | Admitting: Physical Therapy

## 2015-04-15 DIAGNOSIS — R600 Localized edema: Secondary | ICD-10-CM

## 2015-04-15 DIAGNOSIS — T148XXA Other injury of unspecified body region, initial encounter: Secondary | ICD-10-CM

## 2015-04-15 DIAGNOSIS — T798XXS Other early complications of trauma, sequela: Secondary | ICD-10-CM

## 2015-04-15 DIAGNOSIS — T148 Other injury of unspecified body region: Secondary | ICD-10-CM | POA: Diagnosis not present

## 2015-04-15 NOTE — Therapy (Signed)
[place], [place], [address] Phone: [phone]   Fax:  [phone]  Wound Care Therapy  Patient Details  Name: Mary [name] MRN: [id number] Date of Birth: 12/07/1945 No Data Recorded  Encounter Date: 04/15/2015      PT End of Session - 04/15/15 0851    Visit Number 8   Number of Visits 13   Date for PT Re-Evaluation 05/10/15   Authorization Type A[employer]na Medicare   Authorization - Visit Number 8   Authorization - Number of Visits 10   PT Start Time 0802   PT Stop Time 0840   PT Time Calculation (min) 38 min   Activity Tolerance Patient tolerated treatment well   Behavior During Therapy [facility] for tasks assessed/performed      Past Medical History  Diagnosis Date  . CHF (congestive heart failure)   . Diabetes mellitus   . Restless leg syndrome 07/25/2011  . Wound, open     right foot  . Diabetes mellitus with neuropathy 07/25/2011  . Cellulitis and abscess of foot 02/12/2012  . Osteomyelitis of right foot 02/14/2012  . Partial Achilles tendon tear 02/14/2012  . Dysrhythmia     tachy- takes Metoprolol  . OSA (obstructive sleep apnea) 07/25/2011    not using CPAP  . Arthritis     knees  . Anemia   . Complication of anesthesia     pt states after her hysterectomy the doctor said she was "wild" when she woke up     Past Surgical History  Procedure Laterality Date  . Abdominal hysterectomy    . Achilles tendon repair    . Toe amputation      partial rt great toe  . Below knee leg amputation Right 11/25/2012    Dr Sharol Given  . Amputation Right 11/25/2012    Procedure: AMPUTATION BELOW KNEE;  Surgeon: Newt Minion, MD;  Location: [facility];  Service: Orthopedics;  Laterality: Right;  Right Below Knee Amputation  . Breast surgery Left     Lumpectomy non ca  . Cataract extraction w/phaco Right 10/09/2013    Procedure: CATARACT EXTRACTION PHACO AND INTRAOCULAR LENS PLACEMENT (IOC);  Surgeon: Elta Guadeloupe T. Gershon Crane, MD;   Location: [facility];  Service: Ophthalmology;  Laterality: Right;  CDE:9.05  . Cataract extraction w/phaco Left 10/23/2013    Procedure: ATTEMPTED CATARACT EXTRACTION PHACO AND INTRAOCULAR LENS PLACEMENT;  Surgeon: Elta Guadeloupe T. Gershon Crane, MD;  Location[facility];  Service: Ophthalmology;  Laterality: Left;  CDE:  4.41    There were no vitals filed for this visit.  Visit Diagnosis:  Nonhealing nonsurgical wound  Infected wound, sequela  Edema of foot                 Wound Therapy - 04/15/15 0841    Subjective Pt states that she has been on antibiotic and she has began using a non-rinse cleanser on her prothesic sleeve that appears to have decreased the smell.   Patient and Family Stated Goals wound to heal    Date of Onset 02/19/15   Prior Treatments self treat, now on antibiotics.    Pain Assessment No/denies pain   Pain Score 0-No pain   Wound Properties Date First Assessed: 03/10/15 Time First Assessed: 0938 Wound Type: Diabetic ulcer Location: Foot , plantar aspect of 1st MT winds into plantar of Great toe.     Dressing Type Gauze (Comment)  medipore on plantar surface, 4x4 and gauze   Dressing  Changed Changed   Dressing Status Dry   Dressing Change Frequency PRN   Site / Wound Assessment Clean;Bleeding   % Wound base Red or Granulating 75%   % Wound base Yellow 25%   Wound Length (cm) 0.8 cm   Wound Width (cm) 0.3 cm   Wound Depth (cm) 0.3 cm   Undermining (cm) .3 all the way around wound periphery   Margins Epibole (rolled edges)   Drainage Amount Minimal   Drainage Description Serous   Treatment Cleansed;Debridement (Selective)   Wound Properties Date First Assessed: 03/10/15 Time First Assessed: 0935 Wound Type: Diabetic ulcer Location: Toe (Comment  which one) , 4th  Location Orientation: Left , plantar aspect   Present on Admission: No   Dressing Type Gauze (Comment)   Dressing Status Clean   Dressing Change Frequency PRN   Site / Wound Assessment Dry;Pale;Pink   %  Wound base Red or Granulating 75%   % Wound base Yellow 25%   Wound Length (cm) 0.3 cm   Wound Width (cm) 0.3 cm   Undermining (cm) .4 all the way around wound periphery    Drainage Amount Scant   Drainage Description Serous   Selective Debridement - Location Both wounds   Selective Debridement - Tools Used Forceps;Scalpel;Scissors   Selective Debridement - Tissue Removed callous, slough; epiboled edges    Wound Therapy - Clinical Statement Pt undermining is decreasing but is still present.  Pt wounds need to stay open until undermining has closed in.  Will continue to see pt to keep good wound healing environment as wound heals properly.    Wound Therapy - Functional Problem List difficulty walking    Factors Delaying/Impairing Wound Healing Altered sensation;Diabetes Mellitus;Infection - systemic/local;Multiple medical problems;Vascular compromise   Hydrotherapy Plan Debridement;Dressing change;Patient/family education   Wound Therapy - Frequency 3X / week   Wound Therapy - Current Recommendations PT   Wound Plan Continue wound care with approriate dressings to Lt LE.    Dressing  medihoney to wound, 2x2 followed by 2" kling and netting    Decrease Necrotic Tissue to STG:   2 weeks 0   Decrease Necrotic Tissue - Progress Progressing toward goal   Increase Granulation Tissue to STG:  2 weeks 100   Increase Granulation Tissue - Progress Progressing toward goal   Decrease Length/Width/Depth by (cm) STG:  3 weeks:  decrease both wound sites by 50%;  LTG:  6 weeks:  Decrease both wound sites by 75 %    Decrease Length/Width/Depth - Progress Progressing toward goal   Additional Wound Therapy Goal PT to verbalize the importance of maintaining good blood sugars for optimal healing.    Additional Wound Therapy Goal - Progress Progressing toward goal                Problem List Patient Active Problem List   Diagnosis Date Noted  . Delirium 01/13/2014  . Diastolic CHF, acute on  chronic (HCC) 01/13/2014  . Expressive aphasia 01/13/2014  . Acute encephalopathy 03/16/2013  . Acute on chronic renal failure ([place]) 03/16/2013  . Hypoglycemia 03/16/2013  . CHF (congestive heart failure) ([place])   . Unstable balance 01/16/2013  . Difficulty in walking(719.7) 01/16/2013  . Infected wound ([place]) 01/16/2013  . Hx of right BKA ([place]) 12/23/2012  . Candidiasis of breast 12/08/2012  . S/P bilateral BKA (below knee amputation) ([place]) 11/30/2012  . Diabetes ([place]) 11/30/2012  . Diabetic foot ulcer ([place]) 10/08/2012  . UTI (lower urinary tract infection) 10/07/2012  .  Bacteremia due to Staphylococcus aureus 10/07/2012  . Anxiety 02/17/2012  . Osteomyelitis of right foot ([place]) 02/14/2012  . Hematuria, microscopic 02/13/2012  . Hyponatremia 02/13/2012  . PVD (peripheral vascular disease) ([place]) 02/08/2012  . Open wound of heel 02/08/2012  . Hyperlipidemia 11/11/2011  . Hypertension 11/11/2011  . Morbid obesity ([place]) 11/11/2011  . Tachycardia 08/04/2011  . Acute exacerbation of CHF (congestive heart failure) ([place]) 07/25/2011  . Diabetes mellitus with neuropathy 07/25/2011  . OSA (obstructive sleep apnea) 07/25/2011  . Acute bronchitis 07/25/2011  . Generalized weakness 07/25/2011  . Acute respiratory failure ([place]) 07/25/2011  . Restless leg syndrome 07/25/2011  . Anemia 07/25/2011  Rayetta Humphrey, PT CLT [phone] 04/15/2015, 8:52 AM  [facility] [address] [place], [place], [address] Phone: [phone]   Fax:  [phone]  Name: Mary [name] MRN: [id number] Date of Birth: [birth year]

## 2015-04-17 ENCOUNTER — Ambulatory Visit (HOSPITAL_COMMUNITY): Payer: PPO

## 2015-04-22 ENCOUNTER — Ambulatory Visit (HOSPITAL_COMMUNITY): Payer: PPO

## 2015-04-22 DIAGNOSIS — T148XXA Other injury of unspecified body region, initial encounter: Secondary | ICD-10-CM

## 2015-04-22 DIAGNOSIS — T798XXD Other early complications of trauma, subsequent encounter: Secondary | ICD-10-CM

## 2015-04-22 DIAGNOSIS — S81001A Unspecified open wound, right knee, initial encounter: Secondary | ICD-10-CM

## 2015-04-22 DIAGNOSIS — R600 Localized edema: Secondary | ICD-10-CM

## 2015-04-22 DIAGNOSIS — S91001A Unspecified open wound, right ankle, initial encounter: Secondary | ICD-10-CM

## 2015-04-22 DIAGNOSIS — T148 Other injury of unspecified body region: Secondary | ICD-10-CM | POA: Diagnosis not present

## 2015-04-22 DIAGNOSIS — T798XXS Other early complications of trauma, sequela: Secondary | ICD-10-CM

## 2015-04-22 DIAGNOSIS — S81801A Unspecified open wound, right lower leg, initial encounter: Secondary | ICD-10-CM

## 2015-04-22 NOTE — Therapy (Signed)
[place], [place], [address] Phone: [phone]   Fax:  [phone]  Physical Therapy Treatment  Patient Details  Name: Mary [name] MRN: [id number] Date of Birth: 1946/02/13 No Data Recorded  Encounter Date: 04/22/2015      PT End of Session - 04/22/15 1159    Visit Number 9   Number of Visits 13   Date for PT Re-Evaluation 05/10/15   Authorization Type [employer] Medicare   Authorization - Visit Number 9   Authorization - Number of Visits 10   PT Start Time 0802   PT Stop Time 0842   PT Time Calculation (min) 40 min   Activity Tolerance Patient tolerated treatment well   Behavior During Therapy [facility] for tasks assessed/performed      Past Medical History  Diagnosis Date  . CHF (congestive heart failure)   . Diabetes mellitus   . Restless leg syndrome 07/25/2011  . Wound, open     right foot  . Diabetes mellitus with neuropathy 07/25/2011  . Cellulitis and abscess of foot 02/12/2012  . Osteomyelitis of right foot 02/14/2012  . Partial Achilles tendon tear 02/14/2012  . Dysrhythmia     tachy- takes Metoprolol  . OSA (obstructive sleep apnea) 07/25/2011    not using CPAP  . Arthritis     knees  . Anemia   . Complication of anesthesia     pt states after her hysterectomy the doctor said she was "wild" when she woke up     Past Surgical History  Procedure Laterality Date  . Abdominal hysterectomy    . Achilles tendon repair    . Toe amputation      partial rt great toe  . Below knee leg amputation Right 11/25/2012    Dr Sharol Given  . Amputation Right 11/25/2012    Procedure: AMPUTATION BELOW KNEE;  Surgeon: Newt Minion, MD;  Location: [place];  Service: Orthopedics;  Laterality: Right;  Right Below Knee Amputation  . Breast surgery Left     Lumpectomy non ca  . Cataract extraction w/phaco Right 10/09/2013    Procedure: CATARACT EXTRACTION PHACO AND INTRAOCULAR LENS PLACEMENT (IOC);  Surgeon: Elta Guadeloupe T.  Gershon Crane, MD;  Location: [facility];  Service: Ophthalmology;  Laterality: Right;  CDE:9.05  . Cataract extraction w/phaco Left 10/23/2013    Procedure: ATTEMPTED CATARACT EXTRACTION PHACO AND INTRAOCULAR LENS PLACEMENT;  Surgeon: Elta Guadeloupe T. Gershon Crane, MD;  Location: [facility];  Service: Ophthalmology;  Laterality: Left;  CDE:  4.41    There were no vitals filed for this visit.  Visit Diagnosis:  Nonhealing nonsurgical wound  Infected wound, sequela  Edema of foot  Open wound of knee, leg (except thigh), and ankle, complicated, right, initial encounter  Infected wound, subsequent encounter      Subjective Assessment - 04/22/15 1144    Subjective Pt arrived with no dressing on foot, reports she has new wound on great toe.     Currently in Pain? No/denies                       Wound Therapy - 04/22/15 1201    Subjective Pt arrived with no dressing on foot, reports she has new wound on great toe.     Patient and Family Stated Goals wound to heal    Date of Onset 02/19/15   Prior Treatments self treat, now on antibiotics.    Pain Assessment No/denies pain   Pain  Score 0-No pain   Evaluation and Treatment Procedures Explained to Patient/Family Yes   Evaluation and Treatment Procedures agreed to   Wound Properties Date First Assessed: 04/22/15 Time First Assessed: 0815 Wound Type: Diabetic ulcer Location: Toe (Comment  which one) , Great  Location Orientation: Left Wound Description (Comments): Plantar aspect Lt great toe Present on Admission: No   Dressing Type Silver hydrofiber;Gauze (Comment)   Dressing Changed New   Dressing Status Clean;Dry;Intact   Dressing Change Frequency PRN   Site / Wound Assessment Pink   % Wound base Red or Granulating 55%   % Wound base Yellow 45%   % Wound base Black 0%   % Wound base Other (Comment) --  macerated edges   Peri-wound Assessment Maceration   Wound Length (cm) 1.4 cm   Wound Width (cm) 2.2 cm   Wound Depth (cm) 0.1 cm   Drainage  Amount Minimal   Drainage Description Serous   Treatment Cleansed;Debridement (Selective)   Wound Properties Date First Assessed: 03/10/15 Time First Assessed: [id number] Wound Type: Diabetic ulcer Location: Foot , plantar aspect of 1st MT winds into plantar of Great toe.     Dressing Type Silver hydrofiber;Gauze (Comment)   Dressing Changed Changed   Dressing Status Dry   Dressing Change Frequency PRN   Site / Wound Assessment Clean;Bleeding   % Wound base Red or Granulating 75%   % Wound base Yellow 25%   Margins Epibole (rolled edges)   Drainage Amount Minimal   Drainage Description Serous   Treatment Cleansed;Debridement (Selective)   Wound Properties Date First Assessed: 03/10/15 Time First Assessed: 0935 Wound Type: Diabetic ulcer Location: Toe (Comment  which one) , 4th  Location Orientation: Left , plantar aspect   Present on Admission: No   Dressing Type Gauze (Comment)   Dressing Changed Changed   Dressing Status Clean   Dressing Change Frequency PRN   Site / Wound Assessment Dry;Pale;Pink   % Wound base Red or Granulating 100%   Wound Length (cm) 0 cm   Wound Width (cm) 0 cm   Drainage Amount None   Treatment Cleansed;Debridement (Selective)   Selective Debridement - Location Both wounds   Selective Debridement - Tools Used Forceps;Scalpel;Scissors   Selective Debridement - Tissue Removed callous, slough; epiboled edges    Wound Therapy - Clinical Statement Pt with new wound on plantar aspect of great toe, measurements taken and evaluation PT informed.  Pt with lethargic behavior this session, pt began story then fell asleep mid sentence and required rocking to awaken this session.  Pt stated she was tired today from looking through holiday decor last night.  Discurssion held with pt. about blood sugar levels, pt stated she had not checked today but that it was around 160 last night.  Pt educated on importance of checking blood sugar more regularly.  Noted maceration around new  wound on plantar aspect of toe and plantar aspect of foot, changed dressings to silver hydrofiber due to drainage.  Little toe very dry tissue surrounding, selective debridement complete to remove the dry skin with no tunneling noted to little toe.  No reports of pain through session, pt was encouraged to keep dressings intact unless moistened and to increase frequency checking blood sugar levels.     Wound Therapy - Functional Problem List difficulty walking    Factors Delaying/Impairing Wound Healing Altered sensation;Diabetes Mellitus;Infection - systemic/local;Multiple medical problems;Vascular compromise   Hydrotherapy Plan Debridement;Dressing change;Patient/family education   Wound Therapy - Frequency 3X /  week   Wound Therapy - Current Recommendations PT   Wound Plan Gcode due next session.  Continue wound care with approriate dressings to Lt LE.    Dressing  silver hydrofiber with 2x2 and gauze and netting          Problem List Patient Active Problem List   Diagnosis Date Noted  . Delirium 01/13/2014  . Diastolic CHF, acute on chronic (HCC) 01/13/2014  . Expressive aphasia 01/13/2014  . Acute encephalopathy 03/16/2013  . Acute on chronic renal failure ([place]) 03/16/2013  . Hypoglycemia 03/16/2013  . CHF (congestive heart failure) ([place])   . Unstable balance 01/16/2013  . Difficulty in walking(719.7) 01/16/2013  . Infected wound ([place]) 01/16/2013  . Hx of right BKA ([place]) 12/23/2012  . Candidiasis of breast 12/08/2012  . S/P bilateral BKA (below knee amputation) ([place]) 11/30/2012  . Diabetes ([place]) 11/30/2012  . Diabetic foot ulcer ([place]) 10/08/2012  . UTI (lower urinary tract infection) 10/07/2012  . Bacteremia due to Staphylococcus aureus 10/07/2012  . Anxiety 02/17/2012  . Osteomyelitis of right foot ([place]) 02/14/2012  . Hematuria, microscopic 02/13/2012  . Hyponatremia 02/13/2012  . PVD (peripheral vascular disease) ([place]) 02/08/2012  . Open wound of heel 02/08/2012  .  Hyperlipidemia 11/11/2011  . Hypertension 11/11/2011  . Morbid obesity ([place]) 11/11/2011  . Tachycardia 08/04/2011  . Acute exacerbation of CHF (congestive heart failure) ([place]) 07/25/2011  . Diabetes mellitus with neuropathy 07/25/2011  . OSA (obstructive sleep apnea) 07/25/2011  . Acute bronchitis 07/25/2011  . Generalized weakness 07/25/2011  . Acute respiratory failure ([place]) 07/25/2011  . Restless leg syndrome 07/25/2011  . Anemia 07/25/2011   Ihor Austin, [place]; [place]  Aldona Lento 04/22/2015, 12:17 PM  [facility] [address] [place], [place], [address] Phone: [phone]   Fax:  [phone]  Name: NATISHIA GELBER MRN: [id number] Date of Birth: September 16, 1945    Rayetta Humphrey, [place] CLT [phone]

## 2015-04-23 ENCOUNTER — Ambulatory Visit (HOSPITAL_COMMUNITY): Payer: PPO | Admitting: Physical Therapy

## 2015-04-23 DIAGNOSIS — T798XXS Other early complications of trauma, sequela: Secondary | ICD-10-CM

## 2015-04-23 DIAGNOSIS — T148XXA Other injury of unspecified body region, initial encounter: Secondary | ICD-10-CM

## 2015-04-23 DIAGNOSIS — R600 Localized edema: Secondary | ICD-10-CM

## 2015-04-23 DIAGNOSIS — T148 Other injury of unspecified body region: Secondary | ICD-10-CM | POA: Diagnosis not present

## 2015-04-23 NOTE — Therapy (Signed)
[place], [place], [address] Phone: [phone]   Fax:  [phone]  Wound Care Therapy  Patient Details  Name: Mary [name] MRN: [id number] Date of Birth: 1946-05-11 No Data Recorded  Encounter Date: 04/23/2015      PT End of Session - 04/23/15 0849    Visit Number 10   Number of Visits 24   Date for PT Re-Evaluation 05/23/15   Authorization Type [employer]na Medicare   Authorization - Visit Number 10   Authorization - Number of Visits 20   PT Start Time 0803   PT Stop Time 0840   PT Time Calculation (min) 37 min      Past Medical History  Diagnosis Date  . CHF (congestive heart failure)   . Diabetes mellitus   . Restless leg syndrome 07/25/2011  . Wound, open     right foot  . Diabetes mellitus with neuropathy 07/25/2011  . Cellulitis and abscess of foot 02/12/2012  . Osteomyelitis of right foot 02/14/2012  . Partial Achilles tendon tear 02/14/2012  . Dysrhythmia     tachy- takes Metoprolol  . OSA (obstructive sleep apnea) 07/25/2011    not using CPAP  . Arthritis     knees  . Anemia   . Complication of anesthesia     pt states after her hysterectomy the doctor said she was "wild" when she woke up     Past Surgical History  Procedure Laterality Date  . Abdominal hysterectomy    . Achilles tendon repair    . Toe amputation      partial rt great toe  . Below knee leg amputation Right 11/25/2012    Dr Sharol Given  . Amputation Right 11/25/2012    Procedure: AMPUTATION BELOW KNEE;  Surgeon: Newt Minion, MD;  Location: [facility];  Service: Orthopedics;  Laterality: Right;  Right Below Knee Amputation  . Breast surgery Left     Lumpectomy non ca  . Cataract extraction w/phaco Right 10/09/2013    Procedure: CATARACT EXTRACTION PHACO AND INTRAOCULAR LENS PLACEMENT (IOC);  Surgeon: Elta Guadeloupe T. Gershon Crane, MD;  Location: [facility];  Service: Ophthalmology;  Laterality: Right;  CDE:9.05  . Cataract extraction w/phaco Left  10/23/2013    Procedure: ATTEMPTED CATARACT EXTRACTION PHACO AND INTRAOCULAR LENS PLACEMENT;  Surgeon: Elta Guadeloupe T. Gershon Crane, MD;  Location: [facility];  Service: Ophthalmology;  Laterality: Left;  CDE:  4.41    There were no vitals filed for this visit.  Visit Diagnosis:  Nonhealing nonsurgical wound  Infected wound, sequela  Edema of foot      Subjective Assessment - 04/22/15 1144    Subjective Pt arrived with no dressing on foot, reports she has new wound on great toe.     Currently in Pain? No/denies                   Wound Therapy - 04/23/15 0839    Subjective Pt states that her restless leg syndrome is really bothering her.  States she is not sleeping.  Pt is suppose to be wearing a bi-pap but states she can not stand the mask over her face.    Patient and Family Stated Goals wound to heal    Date of Onset 02/19/15   Prior Treatments self treat, now on antibiotics.    Pain Assessment No/denies pain   Evaluation and Treatment Procedures Explained to Patient/Family Yes   Evaluation and Treatment Procedures agreed to   Wound Properties  Date First Assessed: 04/22/15 Time First Assessed: 0815 Wound Type: Diabetic ulcer Location: Toe (Comment  which one) , Great  Location Orientation: Left Wound Description (Comments): Plantar aspect Lt great toe Present on Admission: No   Dressing Type Silver hydrofiber;Gauze (Comment)   Dressing Changed Changed   Dressing Status New drainage   Dressing Change Frequency PRN   Site / Wound Assessment Pink   % Wound base Red or Granulating 55%   % Wound base Yellow 45%   % Wound base Black 0%   % Wound base Other (Comment) --  macerated edges   Peri-wound Assessment Maceration   Wound Length (cm) 1.4 cm   Wound Width (cm) 2.2 cm   Wound Depth (cm) 0.1 cm   Drainage Amount Moderate   Drainage Description Serous   Treatment Cleansed;Debridement (Selective)   Wound Properties Date First Assessed: 03/10/15 Time First Assessed: 2878 Wound  Type: Diabetic ulcer Location: Foot , plantar aspect of 1st MT winds into plantar of Great toe.     Dressing Type Silver hydrofiber;Gauze (Comment)   Dressing Changed Changed   Dressing Status Old drainage   Dressing Change Frequency PRN   Site / Wound Assessment Clean;Bleeding   % Wound base Red or Granulating 75%   % Wound base Yellow 25%   Wound Length (cm) 1 cm   Wound Width (cm) 0.4 cm   Wound Depth (cm) 0.3 cm   Margins Epibole (rolled edges)   Drainage Amount Moderate   Drainage Description Serous   Treatment Cleansed;Debridement (Selective);Other (Comment)   Wound Properties Date First Assessed: 03/10/15 Time First Assessed: 0935 Wound Type: Diabetic ulcer Location: Toe (Comment  which one) , 4th  Location Orientation: Left , plantar aspect   Present on Admission: No   Dressing Type Gauze (Comment);Silver hydrofiber   Dressing Changed Changed   Dressing Status Clean   Dressing Change Frequency PRN   Site / Wound Assessment Dry;Pale;Pink   % Wound base Red or Granulating 100%   % Wound base Yellow 0%   Wound Length (cm) 0.3 cm   Wound Width (cm) 0.3 cm   Undermining (cm) .2 undermining    Drainage Amount None   Treatment Cleansed;Debridement (Selective)   Selective Debridement - Location All wounds   Selective Debridement - Tools Used Forceps;Scalpel;Scissors   Selective Debridement - Tissue Removed callous, slough; epiboled edges    Wound Therapy - Clinical Statement Pt area continues to be macerated around wounds aroung on plantar aspect of great toe and plantar aspect of MTP.  Therapist placed vaseline around wound bed prior to application of silverhydrofiber.  Pt wound on 4th toe reopened continues to undermine underneath.  Therapist spoke to patient at length about the importance of good sleep and how they now have a variety of different masks to use  with the bipap machines.  Pt was urged to request MD to reorder Bipap and let the company know that she has difficulty with  a full mask.     Wound Therapy - Functional Problem List difficulty walking    Factors Delaying/Impairing Wound Healing Altered sensation;Diabetes Mellitus;Infection - systemic/local;Multiple medical problems;Vascular compromise   Hydrotherapy Plan Debridement;Dressing change;Patient/family education   Wound Therapy - Frequency 3X / week   Wound Therapy - Current Recommendations PT   Wound Plan   Continue wound care with approriate dressings to Lt LE.    Dressing  silver hydrofiber with 2x2 and gauze and netting   Decrease Necrotic Tissue to STG:   2  weeks 0   Decrease Necrotic Tissue - Progress Progressing toward goal   Increase Granulation Tissue to STG:  2 weeks 100   Increase Granulation Tissue - Progress Progressing toward goal   Decrease Length/Width/Depth by (cm) STG:  3 weeks:  decrease both wound sites by 50%;  LTG:  6 weeks:  Decrease both wound sites by 75 %    Decrease Length/Width/Depth - Progress Progressing toward goal   Additional Wound Therapy Goal PT to verbalize the importance of maintaining good blood sugars for optimal healing.    Additional Wound Therapy Goal - Progress Met                 PT Education - 05/18/15 0848    Education provided Yes   Education Details The importance of using Bipap to get good sleep and the importance of sleep to wound healing, energy and optimal health   Person(s) Educated Patient   Methods Explanation   Comprehension Verbalized understanding                      G-Codes - 18-May-2015 0850    Functional Assessment Tool Used wound granulation; maceration of surrounding skin; undermining    Functional Limitation Other PT primary   Other PT Primary Current Status (U9323) At least 80 percent but less than 100 percent impaired, limited or restricted   Other PT Primary Goal Status (F5732) At least 60 percent but less than 80 percent impaired, limited or restricted       Problem List Patient Active Problem List    Diagnosis Date Noted  . Delirium 01/13/2014  . Diastolic CHF, acute on chronic (HCC) 01/13/2014  . Expressive aphasia 01/13/2014  . Acute encephalopathy 03/16/2013  . Acute on chronic renal failure ([place]) 03/16/2013  . Hypoglycemia 03/16/2013  . CHF (congestive heart failure) ([place])   . Unstable balance 01/16/2013  . Difficulty in walking(719.7) 01/16/2013  . Infected wound ([place]) 01/16/2013  . Hx of right BKA ([place]) 12/23/2012  . Candidiasis of breast 12/08/2012  . S/P bilateral BKA (below knee amputation) ([place]) 11/30/2012  . Diabetes ([place]) 11/30/2012  . Diabetic foot ulcer ([place]) 10/08/2012  . UTI (lower urinary tract infection) 10/07/2012  . Bacteremia due to Staphylococcus aureus 10/07/2012  . Anxiety 02/17/2012  . Osteomyelitis of right foot ([place]) 02/14/2012  . Hematuria, microscopic 02/13/2012  . Hyponatremia 02/13/2012  . PVD (peripheral vascular disease) ([place]) 02/08/2012  . Open wound of heel 02/08/2012  . Hyperlipidemia 11/11/2011  . Hypertension 11/11/2011  . Morbid obesity ([place]) 11/11/2011  . Tachycardia 08/04/2011  . Acute exacerbation of CHF (congestive heart failure) ([place]) 07/25/2011  . Diabetes mellitus with neuropathy 07/25/2011  . OSA (obstructive sleep apnea) 07/25/2011  . Acute bronchitis 07/25/2011  . Generalized weakness 07/25/2011  . Acute respiratory failure ([place]) 07/25/2011  . Restless leg syndrome 07/25/2011  . Anemia 07/25/2011   Rayetta Humphrey, PT CLT [phone] 05-18-15, 8:57 AM  [facility] [address] [place], [place], [address] Phone: [phone]   Fax:  [phone]  Name: Mary [name] MRN: [id number] Date of Birth: March 10, 1946   Physical Therapy Progress Note  Dates of Reporting Period:  03/10/2015 to May 18, 2015  Objective Reports of Subjective Statement: Pt states she is not sleeping well.  Pt is not using her bi-pap.  Pt feels sugar at 160 is a "good" level  Objective  Measurements: see above:  Wound was doing better until 11/22 when pt came in and  she had a new wound on the plantar aspect of her toe and wound was macerated.    Goal Update: same  Plan: continue to see pt 3x a week for 4 weeks   Reason Skilled Services are Required: Pt diabetic with amputation of Rt LE who has developed multiple wounds on her Lt LE that are not healing.

## 2015-04-29 ENCOUNTER — Telehealth (HOSPITAL_COMMUNITY): Payer: Self-pay | Admitting: Physical Therapy

## 2015-04-29 ENCOUNTER — Ambulatory Visit (HOSPITAL_COMMUNITY): Payer: PPO | Admitting: Physical Therapy

## 2015-04-29 NOTE — Telephone Encounter (Signed)
Spoke to spouse regarding missed appointment.  Mr. Gruman reported she did not have today's appt on her schedule, only an appointment for Thursday, 12/1. Offered an afternoon appointment, however reported they would wait until Thursday.  Teena Irani, PTA/CLT [phone]

## 2015-05-01 ENCOUNTER — Ambulatory Visit (HOSPITAL_COMMUNITY): Payer: PPO | Attending: Internal Medicine | Admitting: Physical Therapy

## 2015-05-01 DIAGNOSIS — T798XXD Other early complications of trauma, subsequent encounter: Secondary | ICD-10-CM | POA: Diagnosis present

## 2015-05-01 DIAGNOSIS — T798XXS Other early complications of trauma, sequela: Secondary | ICD-10-CM | POA: Diagnosis present

## 2015-05-01 DIAGNOSIS — S81001A Unspecified open wound, right knee, initial encounter: Secondary | ICD-10-CM | POA: Diagnosis present

## 2015-05-01 DIAGNOSIS — R2689 Other abnormalities of gait and mobility: Secondary | ICD-10-CM | POA: Insufficient documentation

## 2015-05-01 DIAGNOSIS — T148 Other injury of unspecified body region: Secondary | ICD-10-CM | POA: Insufficient documentation

## 2015-05-01 DIAGNOSIS — S81801A Unspecified open wound, right lower leg, initial encounter: Secondary | ICD-10-CM | POA: Insufficient documentation

## 2015-05-01 DIAGNOSIS — R600 Localized edema: Secondary | ICD-10-CM | POA: Insufficient documentation

## 2015-05-01 DIAGNOSIS — T148XXA Other injury of unspecified body region, initial encounter: Secondary | ICD-10-CM

## 2015-05-01 DIAGNOSIS — S91001A Unspecified open wound, right ankle, initial encounter: Secondary | ICD-10-CM | POA: Diagnosis present

## 2015-05-01 NOTE — Therapy (Signed)
[place], [place], [address] Phone: [phone]   Fax:  [phone]  Wound Care Therapy  Patient Details  Name: Mary [name] MRN: [id number] Date of Birth: 1945/11/07 No Data Recorded  Encounter Date: 05/01/2015      PT End of Session - 05/01/15 0850    Visit Number 11   Number of Visits 24   Date for PT Re-Evaluation 05/23/15   Authorization Type A[employer] Medicare   Authorization - Visit Number 11   Authorization - Number of Visits 20   PT Start Time 0800   PT Stop Time 0830   PT Time Calculation (min) 30 min   Activity Tolerance Patient tolerated treatment well   Behavior During Therapy [facility] for tasks assessed/performed      Past Medical History  Diagnosis Date  . CHF (congestive heart failure)   . Diabetes mellitus   . Restless leg syndrome 07/25/2011  . Wound, open     right foot  . Diabetes mellitus with neuropathy 07/25/2011  . Cellulitis and abscess of foot 02/12/2012  . Osteomyelitis of right foot 02/14/2012  . Partial Achilles tendon tear 02/14/2012  . Dysrhythmia     tachy- takes Metoprolol  . OSA (obstructive sleep apnea) 07/25/2011    not using CPAP  . Arthritis     knees  . Anemia   . Complication of anesthesia     pt states after her hysterectomy the doctor said she was "wild" when she woke up     Past Surgical History  Procedure Laterality Date  . Abdominal hysterectomy    . Achilles tendon repair    . Toe amputation      partial rt great toe  . Below knee leg amputation Right 11/25/2012    Dr Sharol Given  . Amputation Right 11/25/2012    Procedure: AMPUTATION BELOW KNEE;  Surgeon: Newt Minion, MD;  Location: [facility];  Service: Orthopedics;  Laterality: Right;  Right Below Knee Amputation  . Breast surgery Left     Lumpectomy non ca  . Cataract extraction w/phaco Right 10/09/2013    Procedure: CATARACT EXTRACTION PHACO AND INTRAOCULAR LENS PLACEMENT (IOC);  Surgeon: Elta Guadeloupe T. Gershon Crane,  MD;  Location: [facility];  Service: Ophthalmology;  Laterality: Right;  CDE:9.05  . Cataract extraction w/phaco Left 10/23/2013    Procedure: ATTEMPTED CATARACT EXTRACTION PHACO AND INTRAOCULAR LENS PLACEMENT;  Surgeon: Elta Guadeloupe T. Gershon Crane, MD;  Location: [facility];  Service: Ophthalmology;  Laterality: Left;  CDE:  4.41    There were no vitals filed for this visit.  Visit Diagnosis:  Nonhealing nonsurgical wound  Infected wound, sequela  Edema of foot  Open wound of knee, leg (except thigh), and ankle, complicated, right, initial encounter  Infected wound, subsequent encounter  Unstable balance                 Wound Therapy - 05/01/15 0846    Subjective Pt states she keeps her sock on and keeps a bandage on her wound.  No pain reported.   Patient and Family Stated Goals wound to heal    Date of Onset 02/19/15   Prior Treatments self treat, now on antibiotics.    Pain Assessment No/denies pain   Evaluation and Treatment Procedures Explained to Patient/Family Yes   Evaluation and Treatment Procedures agreed to   Wound Properties Date First Assessed: 04/22/15 Time First Assessed: 0815 Wound Type: Diabetic ulcer Location: Toe (Comment  which one) , [place]  Location Orientation: Left Wound Description (Comments): Plantar aspect Lt great toe Present on Admission: No   Dressing Type Silver hydrofiber;Gauze (Comment)   Dressing Changed Changed   Dressing Status New drainage   Dressing Change Frequency PRN   Site / Wound Assessment Pink   % Wound base Red or Granulating 60%   % Wound base Yellow 40%   % Wound base Black 0%   % Wound base Other (Comment) --  macerated edges   Peri-wound Assessment Maceration   Drainage Amount Moderate   Drainage Description Serous   Treatment Cleansed;Debridement (Selective)   Wound Properties Date First Assessed: 03/10/15 Time First Assessed: 8119 Wound Type: Diabetic ulcer Location: Foot , plantar aspect of 1st MT winds into plantar of Great toe.      Dressing Type Silver hydrofiber;Gauze (Comment)   Dressing Changed Changed   Dressing Status Old drainage   Dressing Change Frequency PRN   Site / Wound Assessment Clean;Bleeding   % Wound base Red or Granulating 80%   % Wound base Yellow 20%   Margins Epibole (rolled edges)   Drainage Amount Moderate   Drainage Description Serous   Treatment Cleansed;Debridement (Selective)   Wound Properties Date First Assessed: 03/10/15 Time First Assessed: 0935 Wound Type: Diabetic ulcer Location: Toe (Comment  which one) , 4th  Location Orientation: Left , plantar aspect   Present on Admission: No   Dressing Type Gauze (Comment);Silver hydrofiber   Dressing Changed Changed   Dressing Status Clean   Dressing Change Frequency PRN   Site / Wound Assessment Dry;Pale;Pink   % Wound base Red or Granulating 100%   % Wound base Yellow 0%   Drainage Amount None   Treatment Cleansed;Debridement (Selective)   Selective Debridement - Location All wounds   Selective Debridement - Tools Used Forceps;Scalpel;Scissors   Selective Debridement - Tissue Removed callous, slough; epiboled edges    Wound Therapy - Clinical Statement Wounds continue to be macerated around perimeter plantar aspect of great toe and plantar aspect of MTP.  Noted swelling in great toe.  Trial of lymphedema toe wraps following dressing change with silverhydrofiber and gauze.  Explained to patient the rationale of the toe bandaging and importance of decreasing swelling for proper wound healing.     Pt wound on 4th toe now healed.   Cleansed foot well prior to reapplication of dressing.    Wound Therapy - Functional Problem List difficulty walking    Factors Delaying/Impairing Wound Healing Altered sensation;Diabetes Mellitus;Infection - systemic/local;Multiple medical problems;Vascular compromise   Hydrotherapy Plan Debridement;Dressing change;Patient/family education   Wound Therapy - Frequency 3X / week   Wound Therapy - Current  Recommendations PT   Wound Plan   Continue wound care with approriate dressings to Lt LE. Assess effectiveness of toe wraps next session.   Dressing  silver hydrofiber with 2x2 and gauze and netting   Decrease Necrotic Tissue to STG:   2 weeks 0   Decrease Necrotic Tissue - Progress Progressing toward goal   Increase Granulation Tissue to STG:  2 weeks 100   Increase Granulation Tissue - Progress Progressing toward goal   Decrease Length/Width/Depth by (cm) STG:  3 weeks:  decrease both wound sites by 50%;  LTG:  6 weeks:  Decrease both wound sites by 75 %    Decrease Length/Width/Depth - Progress Progressing toward goal   Additional Wound Therapy Goal PT to verbalize the importance of maintaining good blood sugars for optimal healing.    Additional Wound Therapy Goal -  Progress Met                              Problem List Patient Active Problem List   Diagnosis Date Noted  . Delirium 01/13/2014  . Diastolic CHF, acute on chronic (HCC) 01/13/2014  . Expressive aphasia 01/13/2014  . Acute encephalopathy 03/16/2013  . Acute on chronic renal failure ([place]) 03/16/2013  . Hypoglycemia 03/16/2013  . CHF (congestive heart failure) ([place])   . Unstable balance 01/16/2013  . Difficulty in walking(719.7) 01/16/2013  . Infected wound ([place]) 01/16/2013  . Hx of right BKA ([place]) 12/23/2012  . Candidiasis of breast 12/08/2012  . S/P bilateral BKA (below knee amputation) ([place]) 11/30/2012  . Diabetes ([place]) 11/30/2012  . Diabetic foot ulcer ([place]) 10/08/2012  . UTI (lower urinary tract infection) 10/07/2012  . Bacteremia due to Staphylococcus aureus 10/07/2012  . Anxiety 02/17/2012  . Osteomyelitis of right foot ([place]) 02/14/2012  . Hematuria, microscopic 02/13/2012  . Hyponatremia 02/13/2012  . PVD (peripheral vascular disease) ([place]) 02/08/2012  . Open wound of heel 02/08/2012  . Hyperlipidemia 11/11/2011  . Hypertension 11/11/2011  . Morbid obesity ([place]) 11/11/2011  .  Tachycardia 08/04/2011  . Acute exacerbation of CHF (congestive heart failure) ([place]) 07/25/2011  . Diabetes mellitus with neuropathy 07/25/2011  . OSA (obstructive sleep apnea) 07/25/2011  . Acute bronchitis 07/25/2011  . Generalized weakness 07/25/2011  . Acute respiratory failure ([employer]) 07/25/2011  . Restless leg syndrome 07/25/2011  . Anemia 07/25/2011    Teena Irani, PTA/CLT [phone]  05/01/2015, 8:51 AM  [place] [address] [place], [place], [address] Phone: [phone]   Fax:  [phone]  Name: Mary [name] MRN: [id number] Date of Birth: [birth year]

## 2015-05-06 ENCOUNTER — Ambulatory Visit (HOSPITAL_COMMUNITY): Payer: PPO | Admitting: Physical Therapy

## 2015-05-06 DIAGNOSIS — R2689 Other abnormalities of gait and mobility: Secondary | ICD-10-CM

## 2015-05-06 DIAGNOSIS — R600 Localized edema: Secondary | ICD-10-CM

## 2015-05-06 DIAGNOSIS — S81801A Unspecified open wound, right lower leg, initial encounter: Secondary | ICD-10-CM

## 2015-05-06 DIAGNOSIS — S81001A Unspecified open wound, right knee, initial encounter: Secondary | ICD-10-CM

## 2015-05-06 DIAGNOSIS — T798XXD Other early complications of trauma, subsequent encounter: Secondary | ICD-10-CM

## 2015-05-06 DIAGNOSIS — T148XXA Other injury of unspecified body region, initial encounter: Secondary | ICD-10-CM

## 2015-05-06 DIAGNOSIS — T798XXS Other early complications of trauma, sequela: Secondary | ICD-10-CM

## 2015-05-06 DIAGNOSIS — T148 Other injury of unspecified body region: Secondary | ICD-10-CM | POA: Diagnosis not present

## 2015-05-06 DIAGNOSIS — S91001A Unspecified open wound, right ankle, initial encounter: Secondary | ICD-10-CM

## 2015-05-06 NOTE — Therapy (Signed)
[place], [place], [address] Phone: [phone]   Fax:  [phone]  Wound Care Therapy  Patient Details  Name: Mary [name] MRN: [id number] Date of Birth: August 05, 1945 No Data Recorded  Encounter Date: 05/06/2015      PT End of Session - 05/06/15 1717    Visit Number 12   Number of Visits 24   Date for PT Re-Evaluation 05/23/15   Authorization Type [employer]etna Medicare   Authorization - Visit Number 12   Authorization - Number of Visits 20   PT Start Time 0830   PT Stop Time 0900   PT Time Calculation (min) 30 min   Activity Tolerance Patient tolerated treatment well   Behavior During Therapy [facility] for tasks assessed/performed      Past Medical History  Diagnosis Date  . CHF (congestive heart failure)   . Diabetes mellitus   . Restless leg syndrome 07/25/2011  . Wound, open     right foot  . Diabetes mellitus with neuropathy 07/25/2011  . Cellulitis and abscess of foot 02/12/2012  . Osteomyelitis of right foot 02/14/2012  . Partial Achilles tendon tear 02/14/2012  . Dysrhythmia     tachy- takes Metoprolol  . OSA (obstructive sleep apnea) 07/25/2011    not using CPAP  . Arthritis     knees  . Anemia   . Complication of anesthesia     pt states after her hysterectomy the doctor said she was "wild" when she woke up     Past Surgical History  Procedure Laterality Date  . Abdominal hysterectomy    . Achilles tendon repair    . Toe amputation      partial rt great toe  . Below knee leg amputation Right 11/25/2012    Dr Sharol Given  . Amputation Right 11/25/2012    Procedure: AMPUTATION BELOW KNEE;  Surgeon: Newt Minion, MD;  Location: [facility];  Service: Orthopedics;  Laterality: Right;  Right Below Knee Amputation  . Breast surgery Left     Lumpectomy non ca  . Cataract extraction w/phaco Right 10/09/2013    Procedure: CATARACT EXTRACTION PHACO AND INTRAOCULAR LENS PLACEMENT (IOC);  Surgeon: Elta Guadeloupe T. Gershon Crane,  MD;  Location: [facility];  Service: Ophthalmology;  Laterality: Right;  CDE:9.05  . Cataract extraction w/phaco Left 10/23/2013    Procedure: ATTEMPTED CATARACT EXTRACTION PHACO AND INTRAOCULAR LENS PLACEMENT;  Surgeon: Elta Guadeloupe T. Gershon Crane, MD;  Location: [facility];  Service: Ophthalmology;  Laterality: Left;  CDE:  4.41    There were no vitals filed for this visit.  Visit Diagnosis:  Nonhealing nonsurgical wound  Infected wound, sequela  Edema of foot  Open wound of knee, leg (except thigh), and ankle, complicated, right, initial encounter  Unstable balance  Infected wound, subsequent encounter                 Wound Therapy - 05/06/15 1710    Subjective PT states her foot was bleeding yestday.  States her bandage came off with her sock and her husband redressed it.  No pain reported   Patient and Family Stated Goals wound to heal    Date of Onset 02/19/15   Prior Treatments self treat, now on antibiotics.    Pain Assessment No/denies pain   Evaluation and Treatment Procedures Explained to Patient/Family Yes   Evaluation and Treatment Procedures agreed to   Wound Properties Date First Assessed: 04/22/15 Time First Assessed: 0815 Wound Type: Diabetic ulcer Location:  Toe (Comment  which one) , [place]  Location Orientation: Left Wound Description (Comments): Plantar aspect Lt great toe Present on Admission: No   Dressing Type Silver hydrofiber;Gauze (Comment)   Dressing Changed Changed   Dressing Status New drainage   Dressing Change Frequency PRN   Site / Wound Assessment Pink   % Wound base Red or Granulating 60%   % Wound base Yellow 40%   % Wound base Black 0%   % Wound base Other (Comment) --  macerated edges   Peri-wound Assessment Maceration   Wound Length (cm) 2 cm   Wound Width (cm) 1.8 cm   Drainage Amount Moderate   Drainage Description Serous   Treatment Cleansed;Debridement (Selective)   Wound Properties Date First Assessed: 03/10/15 Time First Assessed: [id number]  Wound Type: Diabetic ulcer Location: Foot , plantar aspect of 1st MT winds into plantar of Great toe.     Dressing Type Silver hydrofiber;Gauze (Comment)   Dressing Changed Changed   Dressing Status Old drainage   Dressing Change Frequency PRN   Site / Wound Assessment Clean;Bleeding   % Wound base Red or Granulating 80%   % Wound base Yellow 20%   Margins Epibole (rolled edges)   Drainage Amount Moderate   Drainage Description Serous   Treatment Cleansed;Debridement (Selective)   Wound Properties Date First Assessed: 03/10/15 Time First Assessed: 0935 Wound Type: Diabetic ulcer Location: Toe (Comment  which one) , 4th  Location Orientation: Left , plantar aspect   Present on Admission: No   Dressing Type --   Dressing Status --   Dressing Change Frequency --   Site / Wound Assessment --   % Wound base Red or Granulating --   % Wound base Yellow --   Drainage Amount --   Selective Debridement - Location All wounds   Selective Debridement - Tools Used Forceps;Scalpel;Scissors   Selective Debridement - Tissue Removed callous, slough; epiboled edges    Wound Therapy - Clinical Statement Increased maceration plantar great toe with loosening of skin perimeter of wound.  Removed alot skin from superior and medial borders, making wound larger in size but opening up borders.  Able to pack dressing down into second wound just inferior to great toe on plantar surface.  Secured wtih tape and completed toe bandaging to great toe to help decrease edema and promote closure.    Wound Therapy - Functional Problem List difficulty walking    Factors Delaying/Impairing Wound Healing Altered sensation;Diabetes Mellitus;Infection - systemic/local;Multiple medical problems;Vascular compromise   Hydrotherapy Plan Debridement;Dressing change;Patient/family education   Wound Therapy - Frequency 3X / week   Wound Therapy - Current Recommendations PT   Wound Plan   Continue wound care with approriate dressings to  Lt LE.    Dressing  silver hydrofiber with 2x2 and gauze and netting   Decrease Necrotic Tissue to STG:   2 weeks 0   Increase Granulation Tissue to STG:  2 weeks 100   Decrease Length/Width/Depth by (cm) STG:  3 weeks:  decrease both wound sites by 50%;  LTG:  6 weeks:  Decrease both wound sites by 75 %    Additional Wound Therapy Goal PT to verbalize the importance of maintaining good blood sugars for optimal healing.                               Problem List Patient Active Problem List   Diagnosis Date Noted  . Delirium  01/13/2014  . Diastolic CHF, acute on chronic (HCC) 01/13/2014  . Expressive aphasia 01/13/2014  . Acute encephalopathy 03/16/2013  . Acute on chronic renal failure ([place]) 03/16/2013  . Hypoglycemia 03/16/2013  . CHF (congestive heart failure) ([place])   . Unstable balance 01/16/2013  . Difficulty in walking(719.7) 01/16/2013  . Infected wound ([place]) 01/16/2013  . Hx of right BKA ([place]) 12/23/2012  . Candidiasis of breast 12/08/2012  . S/P bilateral BKA (below knee amputation) ([place]) 11/30/2012  . Diabetes ([place]) 11/30/2012  . Diabetic foot ulcer ([place]) 10/08/2012  . UTI (lower urinary tract infection) 10/07/2012  . Bacteremia due to Staphylococcus aureus 10/07/2012  . Anxiety 02/17/2012  . Osteomyelitis of right foot ([place]) 02/14/2012  . Hematuria, microscopic 02/13/2012  . Hyponatremia 02/13/2012  . PVD (peripheral vascular disease) ([place]) 02/08/2012  . Open wound of heel 02/08/2012  . Hyperlipidemia 11/11/2011  . Hypertension 11/11/2011  . Morbid obesity ([place]) 11/11/2011  . Tachycardia 08/04/2011  . Acute exacerbation of CHF (congestive heart failure) ([place]) 07/25/2011  . Diabetes mellitus with neuropathy 07/25/2011  . OSA (obstructive sleep apnea) 07/25/2011  . Acute bronchitis 07/25/2011  . Generalized weakness 07/25/2011  . Acute respiratory failure ([place]) 07/25/2011  . Restless leg syndrome 07/25/2011  . Anemia 07/25/2011    Teena Irani, PTA/CLT [phone]  05/06/2015, 5:20 PM  [place], [place], [address] Phone: [phone]   Fax:  [phone]  Name: Mary [name] MRN: [id number] Date of Birth: 1946/02/14

## 2015-05-08 ENCOUNTER — Ambulatory Visit (HOSPITAL_COMMUNITY): Payer: PPO | Admitting: Physical Therapy

## 2015-05-08 DIAGNOSIS — R2689 Other abnormalities of gait and mobility: Secondary | ICD-10-CM

## 2015-05-08 DIAGNOSIS — T798XXD Other early complications of trauma, subsequent encounter: Secondary | ICD-10-CM

## 2015-05-08 DIAGNOSIS — R600 Localized edema: Secondary | ICD-10-CM

## 2015-05-08 DIAGNOSIS — T148XXA Other injury of unspecified body region, initial encounter: Secondary | ICD-10-CM

## 2015-05-08 DIAGNOSIS — S81001A Unspecified open wound, right knee, initial encounter: Secondary | ICD-10-CM

## 2015-05-08 DIAGNOSIS — T798XXS Other early complications of trauma, sequela: Secondary | ICD-10-CM

## 2015-05-08 DIAGNOSIS — S91001A Unspecified open wound, right ankle, initial encounter: Secondary | ICD-10-CM

## 2015-05-08 DIAGNOSIS — T148 Other injury of unspecified body region: Secondary | ICD-10-CM | POA: Diagnosis not present

## 2015-05-08 DIAGNOSIS — S81801A Unspecified open wound, right lower leg, initial encounter: Secondary | ICD-10-CM

## 2015-05-08 NOTE — Therapy (Signed)
[place], [place], [address] Phone: [phone]   Fax:  [phone]  Wound Care Therapy  Patient Details  Name: Mary [name] MRN: [id number] Date of Birth: 13-Sep-1945 No Data Recorded  Encounter Date: 05/08/2015      PT End of Session - 05/08/15 0930    Visit Number 13   Number of Visits 24   Date for PT Re-Evaluation 05/23/15   Authorization Type Aetna Medicare   Authorization - Visit Number 13   Authorization - Number of Visits 20   PT Start Time 0800   PT Stop Time 0835   PT Time Calculation (min) 35 min   Activity Tolerance Patient tolerated treatment well   Behavior During Therapy [facility] for tasks assessed/performed      Past Medical History  Diagnosis Date  . CHF (congestive heart failure)   . Diabetes mellitus   . Restless leg syndrome 07/25/2011  . Wound, open     right foot  . Diabetes mellitus with neuropathy 07/25/2011  . Cellulitis and abscess of foot 02/12/2012  . Osteomyelitis of right foot 02/14/2012  . Partial Achilles tendon tear 02/14/2012  . Dysrhythmia     tachy- takes Metoprolol  . OSA (obstructive sleep apnea) 07/25/2011    not using CPAP  . Arthritis     knees  . Anemia   . Complication of anesthesia     pt states after her hysterectomy the doctor said she was "wild" when she woke up     Past Surgical History  Procedure Laterality Date  . Abdominal hysterectomy    . Achilles tendon repair    . Toe amputation      partial rt great toe  . Below knee leg amputation Right 11/25/2012    Dr Sharol Given  . Amputation Right 11/25/2012    Procedure: AMPUTATION BELOW KNEE;  Surgeon: Newt Minion, MD;  Location: [facility];  Service: Orthopedics;  Laterality: Right;  Right Below Knee Amputation  . Breast surgery Left     Lumpectomy non ca  . Cataract extraction w/phaco Right 10/09/2013    Procedure: CATARACT EXTRACTION PHACO AND INTRAOCULAR LENS PLACEMENT (IOC);  Surgeon: Elta Guadeloupe T. Gershon Crane,  MD;  Location: [facility];  Service: Ophthalmology;  Laterality: Right;  CDE:9.05  . Cataract extraction w/phaco Left 10/23/2013    Procedure: ATTEMPTED CATARACT EXTRACTION PHACO AND INTRAOCULAR LENS PLACEMENT;  Surgeon: Elta Guadeloupe T. Gershon Crane, MD;  Location: [facility];  Service: Ophthalmology;  Laterality: Left;  CDE:  4.41    There were no vitals filed for this visit.  Visit Diagnosis:  Nonhealing nonsurgical wound  Infected wound, sequela  Edema of foot  Open wound of knee, leg (except thigh), and ankle, complicated, right, initial encounter  Unstable balance  Infected wound, subsequent encounter                 Wound Therapy - 05/08/15 0927    Subjective PT states she managed to keep the bandages on this time.  No pain or bleeding reported   Patient and Family Stated Goals wound to heal    Date of Onset 02/19/15   Prior Treatments self treat, now on antibiotics.    Pain Assessment No/denies pain   Evaluation and Treatment Procedures Explained to Patient/Family Yes   Evaluation and Treatment Procedures agreed to   Wound Properties Date First Assessed: 04/22/15 Time First Assessed: 0815 Wound Type: Diabetic ulcer Location: Toe (Comment  which one) , [place]  Location Orientation: Left Wound Description (Comments): Plantar aspect Lt great toe Present on Admission: No   Dressing Type Silver hydrofiber;Gauze (Comment)   Dressing Changed Changed   Dressing Status New drainage   Dressing Change Frequency PRN   Site / Wound Assessment Pink   % Wound base Red or Granulating 70%   % Wound base Yellow 30%   % Wound base Black 0%   % Wound base Other (Comment) --  macerated edges   Peri-wound Assessment Maceration   Drainage Amount Minimal   Drainage Description Serous   Treatment Cleansed;Debridement (Selective)   Wound Properties Date First Assessed: 03/10/15 Time First Assessed: 9191 Wound Type: Diabetic ulcer Location: Foot , plantar aspect of 1st MT winds into plantar of Great toe.      Dressing Type Silver hydrofiber;Gauze (Comment)   Dressing Changed Changed   Dressing Status Old drainage   Dressing Change Frequency PRN   Site / Wound Assessment Clean;Bleeding   % Wound base Red or Granulating 80%   % Wound base Yellow 20%   Margins --   Drainage Amount Minimal   Drainage Description Serous   Treatment Cleansed;Debridement (Selective)   Wound Properties Date First Assessed: 03/10/15 Time First Assessed: 0935 Wound Type: Diabetic ulcer Location: Toe (Comment  which one) , 4th  Location Orientation: Left , plantar aspect   Present on Admission: No   Selective Debridement - Location All wounds   Selective Debridement - Tools Used Forceps;Scalpel;Scissors   Selective Debridement - Tissue Removed callous, slough; epiboled edges    Wound Therapy - Clinical Statement Decreased maceration today with overall improvement noted.  Increased skin integrity and decreased edema and drainage in great toe. Again Secured wtih tape as dressing stayed intact much better.  Continued toe bandaging to great toe to help decrease edema and promote closure.    Wound Therapy - Functional Problem List difficulty walking    Factors Delaying/Impairing Wound Healing Altered sensation;Diabetes Mellitus;Infection - systemic/local;Multiple medical problems;Vascular compromise   Hydrotherapy Plan Debridement;Dressing change;Patient/family education   Wound Therapy - Frequency 3X / week   Wound Therapy - Current Recommendations PT   Wound Plan   Continue wound care with approriate dressings to Lt LE.    Dressing  silver hydrofiber with 2x2 and gauze and netting   Decrease Necrotic Tissue to STG:   2 weeks 0   Decrease Necrotic Tissue - Progress Progressing toward goal   Increase Granulation Tissue to STG:  2 weeks 100   Increase Granulation Tissue - Progress Progressing toward goal   Decrease Length/Width/Depth by (cm) STG:  3 weeks:  decrease both wound sites by 50%;  LTG:  6 weeks:  Decrease both  wound sites by 75 %    Decrease Length/Width/Depth - Progress Progressing toward goal   Additional Wound Therapy Goal PT to verbalize the importance of maintaining good blood sugars for optimal healing.    Additional Wound Therapy Goal - Progress Met                              Problem List Patient Active Problem List   Diagnosis Date Noted  . Delirium 01/13/2014  . Diastolic CHF, acute on chronic (HCC) 01/13/2014  . Expressive aphasia 01/13/2014  . Acute encephalopathy 03/16/2013  . Acute on chronic renal failure ([place]) 03/16/2013  . Hypoglycemia 03/16/2013  . CHF (congestive heart failure) ([place])   . Unstable balance 01/16/2013  . Difficulty in walking(719.7) 01/16/2013  .  Infected wound ([place]) 01/16/2013  . Hx of right BKA ([place]) 12/23/2012  . Candidiasis of breast 12/08/2012  . S/P bilateral BKA (below knee amputation) ([place]) 11/30/2012  . Diabetes ([place]) 11/30/2012  . Diabetic foot ulcer ([place]) 10/08/2012  . UTI (lower urinary tract infection) 10/07/2012  . Bacteremia due to Staphylococcus aureus 10/07/2012  . Anxiety 02/17/2012  . Osteomyelitis of right foot ([place]) 02/14/2012  . Hematuria, microscopic 02/13/2012  . Hyponatremia 02/13/2012  . PVD (peripheral vascular disease) ([place]) 02/08/2012  . Open wound of heel 02/08/2012  . Hyperlipidemia 11/11/2011  . Hypertension 11/11/2011  . Morbid obesity ([place]) 11/11/2011  . Tachycardia 08/04/2011  . Acute exacerbation of CHF (congestive heart failure) ([place]) 07/25/2011  . Diabetes mellitus with neuropathy 07/25/2011  . OSA (obstructive sleep apnea) 07/25/2011  . Acute bronchitis 07/25/2011  . Generalized weakness 07/25/2011  . Acute respiratory failure ([place]) 07/25/2011  . Restless leg syndrome 07/25/2011  . Anemia 07/25/2011    Teena Irani, PTA/CLT [phone]  05/08/2015, 9:32 AM  [place] [address] [place], [place], [address] Phone: [phone]    Fax:  [phone]  Name: Mary [name] MRN: [id number] Date of Birth: [birth year]

## 2015-05-13 ENCOUNTER — Ambulatory Visit (HOSPITAL_COMMUNITY): Payer: PPO | Admitting: Physical Therapy

## 2015-05-13 DIAGNOSIS — T798XXD Other early complications of trauma, subsequent encounter: Secondary | ICD-10-CM

## 2015-05-13 DIAGNOSIS — R600 Localized edema: Secondary | ICD-10-CM

## 2015-05-13 DIAGNOSIS — T798XXS Other early complications of trauma, sequela: Secondary | ICD-10-CM

## 2015-05-13 DIAGNOSIS — T148 Other injury of unspecified body region: Secondary | ICD-10-CM | POA: Diagnosis not present

## 2015-05-13 DIAGNOSIS — S91001A Unspecified open wound, right ankle, initial encounter: Secondary | ICD-10-CM

## 2015-05-13 DIAGNOSIS — T148XXA Other injury of unspecified body region, initial encounter: Secondary | ICD-10-CM

## 2015-05-13 DIAGNOSIS — S81801A Unspecified open wound, right lower leg, initial encounter: Secondary | ICD-10-CM

## 2015-05-13 DIAGNOSIS — S81001A Unspecified open wound, right knee, initial encounter: Secondary | ICD-10-CM

## 2015-05-13 DIAGNOSIS — R2689 Other abnormalities of gait and mobility: Secondary | ICD-10-CM

## 2015-05-13 NOTE — Therapy (Signed)
[place], [place], [address] Phone: [phone]   Fax:  [phone]  Wound Care Therapy  Patient Details  Name: Mary [name] MRN: [id number] Date of Birth: 10-Sep-1945 No Data Recorded  Encounter Date: 05/13/2015      PT End of Session - 05/13/15 0851    Visit Number 14   Number of Visits 24   Date for PT Re-Evaluation 05/23/15   Authorization Type [employer] Medicare   Authorization - Visit Number 14   Authorization - Number of Visits 20   PT Start Time 0800   PT Stop Time 0830   PT Time Calculation (min) 30 min   Activity Tolerance Patient tolerated treatment well   Behavior During Therapy [facility] for tasks assessed/performed      Past Medical History  Diagnosis Date  . CHF (congestive heart failure)   . Diabetes mellitus   . Restless leg syndrome 07/25/2011  . Wound, open     right foot  . Diabetes mellitus with neuropathy 07/25/2011  . Cellulitis and abscess of foot 02/12/2012  . Osteomyelitis of right foot 02/14/2012  . Partial Achilles tendon tear 02/14/2012  . Dysrhythmia     tachy- takes Metoprolol  . OSA (obstructive sleep apnea) 07/25/2011    not using CPAP  . Arthritis     knees  . Anemia   . Complication of anesthesia     pt states after her hysterectomy the doctor said she was "wild" when she woke up     Past Surgical History  Procedure Laterality Date  . Abdominal hysterectomy    . Achilles tendon repair    . Toe amputation      partial rt great toe  . Below knee leg amputation Right 11/25/2012    Dr Sharol Given  . Amputation Right 11/25/2012    Procedure: AMPUTATION BELOW KNEE;  Surgeon: Newt Minion, MD;  Location: [facility];  Service: Orthopedics;  Laterality: Right;  Right Below Knee Amputation  . Breast surgery Left     Lumpectomy non ca  . Cataract extraction w/phaco Right 10/09/2013    Procedure: CATARACT EXTRACTION PHACO AND INTRAOCULAR LENS PLACEMENT (IOC);  Surgeon: Elta Guadeloupe T. Gershon Crane,  MD;  Location: [facility];  Service: Ophthalmology;  Laterality: Right;  CDE:9.05  . Cataract extraction w/phaco Left 10/23/2013    Procedure: ATTEMPTED CATARACT EXTRACTION PHACO AND INTRAOCULAR LENS PLACEMENT;  Surgeon: Elta Guadeloupe T. Gershon Crane, MD;  Location: [facility];  Service: Ophthalmology;  Laterality: Left;  CDE:  4.41    There were no vitals filed for this visit.  Visit Diagnosis:  Nonhealing nonsurgical wound  Infected wound, sequela  Edema of foot  Open wound of knee, leg (except thigh), and ankle, complicated, right, initial encounter  Unstable balance  Infected wound, subsequent encounter                 Wound Therapy - 05/13/15 0843    Subjective Dressing intact today and patient without complaints   Patient and Family Stated Goals wound to heal    Date of Onset 02/19/15   Prior Treatments self treat, now on antibiotics.    Pain Assessment No/denies pain   Evaluation and Treatment Procedures Explained to Patient/Family Yes   Evaluation and Treatment Procedures agreed to   Wound Properties Date First Assessed: 04/22/15 Time First Assessed: 0815 Wound Type: Diabetic ulcer Location: Toe (Comment  which one) , Great  Location Orientation: Left Wound Description (Comments): Plantar aspect Lt great  toe Present on Admission: No   Dressing Type Silver hydrofiber;Gauze (Comment)   Dressing Changed Changed   Dressing Status New drainage   Dressing Change Frequency PRN   Site / Wound Assessment Pink   % Wound base Red or Granulating 80%   % Wound base Yellow 20%   % Wound base Black 0%   % Wound base Other (Comment) --  macerated edges   Peri-wound Assessment Maceration   Drainage Amount Minimal   Drainage Description Serous   Treatment Cleansed;Debridement (Selective)   Wound Properties Date First Assessed: 03/10/15 Time First Assessed: 1779 Wound Type: Diabetic ulcer Location: Foot , plantar aspect of 1st MT winds into plantar of Great toe.     Dressing Type Silver  hydrofiber;Gauze (Comment)   Dressing Changed Changed   Dressing Status Old drainage   Dressing Change Frequency PRN   Site / Wound Assessment Clean;Bleeding   % Wound base Red or Granulating 100%   % Wound base Yellow 0%   Drainage Amount None   Drainage Description --   Treatment Cleansed;Debridement (Selective)   Wound Properties Date First Assessed: 03/10/15 Time First Assessed: 0935 Wound Type: Diabetic ulcer Location: Toe (Comment  which one) , 4th  Location Orientation: Left , plantar aspect   Present on Admission: No   Selective Debridement - Location All wounds   Selective Debridement - Tools Used Forceps;Scalpel;Scissors   Selective Debridement - Tissue Removed callous, slough; epiboled edges    Wound Therapy - Clinical Statement Much improved. wound inferior to great toe on plantar surface is now healed, just with dry skin remaininng.  Great toe with increased approximation and increased granulation.  Toe bandaging are helping to decrease swelling and improve healing environment.  Dressing secured with #3 netting.    Wound Therapy - Functional Problem List difficulty walking    Factors Delaying/Impairing Wound Healing Altered sensation;Diabetes Mellitus;Infection - systemic/local;Multiple medical problems;Vascular compromise   Hydrotherapy Plan Debridement;Dressing change;Patient/family education   Wound Therapy - Frequency 3X / week   Wound Therapy - Current Recommendations PT   Wound Plan   Continue wound care with approriate dressings to Lt LE.    Dressing  silver hydrofiber with 2x2 and gauze and netting   Decrease Necrotic Tissue to STG:   2 weeks 0   Decrease Necrotic Tissue - Progress Progressing toward goal   Increase Granulation Tissue to STG:  2 weeks 100   Increase Granulation Tissue - Progress Progressing toward goal   Decrease Length/Width/Depth by (cm) STG:  3 weeks:  decrease both wound sites by 50%;  LTG:  6 weeks:  Decrease both wound sites by 75 %    Decrease  Length/Width/Depth - Progress Progressing toward goal   Additional Wound Therapy Goal PT to verbalize the importance of maintaining good blood sugars for optimal healing.    Additional Wound Therapy Goal - Progress Met                              Problem List Patient Active Problem List   Diagnosis Date Noted  . Delirium 01/13/2014  . Diastolic CHF, acute on chronic (HCC) 01/13/2014  . Expressive aphasia 01/13/2014  . Acute encephalopathy 03/16/2013  . Acute on chronic renal failure ([place]) 03/16/2013  . Hypoglycemia 03/16/2013  . CHF (congestive heart failure) ([place])   . Unstable balance 01/16/2013  . Difficulty in walking(719.7) 01/16/2013  . Infected wound ([place]) 01/16/2013  . Hx of right BKA (  [place]) 12/23/2012  . Candidiasis of breast 12/08/2012  . S/P bilateral BKA (below knee amputation) ([place]) 11/30/2012  . Diabetes ([place]) 11/30/2012  . Diabetic foot ulcer ([place]) 10/08/2012  . UTI (lower urinary tract infection) 10/07/2012  . Bacteremia due to Staphylococcus aureus 10/07/2012  . Anxiety 02/17/2012  . Osteomyelitis of right foot ([place]) 02/14/2012  . Hematuria, microscopic 02/13/2012  . Hyponatremia 02/13/2012  . PVD (peripheral vascular disease) ([place]) 02/08/2012  . Open wound of heel 02/08/2012  . Hyperlipidemia 11/11/2011  . Hypertension 11/11/2011  . Morbid obesity ([place]) 11/11/2011  . Tachycardia 08/04/2011  . Acute exacerbation of CHF (congestive heart failure) ([place]) 07/25/2011  . Diabetes mellitus with neuropathy 07/25/2011  . OSA (obstructive sleep apnea) 07/25/2011  . Acute bronchitis 07/25/2011  . Generalized weakness 07/25/2011  . Acute respiratory failure ([place]) 07/25/2011  . Restless leg syndrome 07/25/2011  . Anemia 07/25/2011    Teena Irani, PTA/CLT [phone] 05/13/2015, 8:58 AM  [facility] [address] [place], [place], [address] Phone: [phone]   Fax:  [phone]  Name:  ANUHEA GASSNER MRN: [id number] Date of Birth: Nov 13, 1945

## 2015-05-15 ENCOUNTER — Ambulatory Visit (HOSPITAL_COMMUNITY): Payer: PPO | Admitting: Physical Therapy

## 2015-05-15 DIAGNOSIS — T148XXA Other injury of unspecified body region, initial encounter: Secondary | ICD-10-CM

## 2015-05-15 DIAGNOSIS — T798XXD Other early complications of trauma, subsequent encounter: Secondary | ICD-10-CM

## 2015-05-15 DIAGNOSIS — R2689 Other abnormalities of gait and mobility: Secondary | ICD-10-CM

## 2015-05-15 DIAGNOSIS — R600 Localized edema: Secondary | ICD-10-CM

## 2015-05-15 DIAGNOSIS — T798XXS Other early complications of trauma, sequela: Secondary | ICD-10-CM

## 2015-05-15 DIAGNOSIS — S81001A Unspecified open wound, right knee, initial encounter: Secondary | ICD-10-CM

## 2015-05-15 DIAGNOSIS — T148 Other injury of unspecified body region: Secondary | ICD-10-CM | POA: Diagnosis not present

## 2015-05-15 DIAGNOSIS — S91001A Unspecified open wound, right ankle, initial encounter: Secondary | ICD-10-CM

## 2015-05-15 DIAGNOSIS — S81801A Unspecified open wound, right lower leg, initial encounter: Secondary | ICD-10-CM

## 2015-05-15 NOTE — Therapy (Signed)
[place], [place], [address] Phone: [phone]   Fax:  [phone]  Wound Care Therapy  Patient Details  Name: Mary [name] MRN: [id number] Date of Birth: [birth year] No Data Recorded  Encounter Date: 05/15/2015      PT End of Session - 05/15/15 1050    Visit Number 15   Number of Visits 24   Date for PT Re-Evaluation 05/23/15   Authorization Type Aetna Medicare   Authorization - Visit Number 15   Authorization - Number of Visits 20   PT Start Time 0800   PT Stop Time 0830   PT Time Calculation (min) 30 min   Activity Tolerance Patient tolerated treatment well   Behavior During Therapy [facility] for tasks assessed/performed      Past Medical History  Diagnosis Date  . CHF (congestive heart failure)   . Diabetes mellitus   . Restless leg syndrome 07/25/2011  . Wound, open     right foot  . Diabetes mellitus with neuropathy 07/25/2011  . Cellulitis and abscess of foot 02/12/2012  . Osteomyelitis of right foot 02/14/2012  . Partial Achilles tendon tear 02/14/2012  . Dysrhythmia     tachy- takes Metoprolol  . OSA (obstructive sleep apnea) 07/25/2011    not using CPAP  . Arthritis     knees  . Anemia   . Complication of anesthesia     pt states after her hysterectomy the doctor said she was "wild" when she woke up     Past Surgical History  Procedure Laterality Date  . Abdominal hysterectomy    . Achilles tendon repair    . Toe amputation      partial rt great toe  . Below knee leg amputation Right 11/25/2012    Dr Sharol Given  . Amputation Right 11/25/2012    Procedure: AMPUTATION BELOW KNEE;  Surgeon: Newt Minion, MD;  Location: [facility];  Service: Orthopedics;  Laterality: Right;  Right Below Knee Amputation  . Breast surgery Left     Lumpectomy non ca  . Cataract extraction w/phaco Right 10/09/2013    Procedure: CATARACT EXTRACTION PHACO AND INTRAOCULAR LENS PLACEMENT (IOC);  Surgeon: Elta Guadeloupe T. Gershon Crane,  MD;  Location: [facility];  Service: Ophthalmology;  Laterality: Right;  CDE:9.05  . Cataract extraction w/phaco Left 10/23/2013    Procedure: ATTEMPTED CATARACT EXTRACTION PHACO AND INTRAOCULAR LENS PLACEMENT;  Surgeon: Elta Guadeloupe T. Gershon Crane, MD;  Location: [facility];  Service: Ophthalmology;  Laterality: Left;  CDE:  4.41    There were no vitals filed for this visit.  Visit Diagnosis:  Nonhealing nonsurgical wound  Infected wound, sequela  Edema of foot  Open wound of knee, leg (except thigh), and ankle, complicated, right, initial encounter  Unstable balance  Infected wound, subsequent encounter                 Wound Therapy - 05/15/15 1017    Subjective Dressing intact today and patient without complaints   Patient and Family Stated Goals wound to heal    Date of Onset 02/19/15   Prior Treatments self treat, now on antibiotics.    Pain Assessment No/denies pain   Evaluation and Treatment Procedures Explained to Patient/Family Yes   Evaluation and Treatment Procedures agreed to   Wound Properties Date First Assessed: 04/22/15 Time First Assessed: 0815 Wound Type: Diabetic ulcer Location: Toe (Comment  which one) , Great  Location Orientation: Left Wound Description (Comments): Plantar aspect Lt great  toe Present on Admission: No   Dressing Type Silver hydrofiber;Gauze (Comment)   Dressing Changed Changed   Dressing Status New drainage   Dressing Change Frequency PRN   Site / Wound Assessment Pink   % Wound base Red or Granulating 90%   % Wound base Yellow 10%   % Wound base Black 0%   % Wound base Other (Comment) --  macerated edges   Peri-wound Assessment Maceration   Drainage Amount Minimal   Drainage Description Serous   Treatment Cleansed;Debridement (Selective)   Wound Properties Date First Assessed: 03/10/15 Time First Assessed: [id number] Wound Type: Diabetic ulcer Location: Foot , plantar aspect of 1st MT winds into plantar of Great toe.   Location Orientation: Left  Final Assessment Date: 05/15/15 Final Assessment Time: 1019   Wound Properties Date First Assessed: 03/10/15 Time First Assessed: 0935 Wound Type: Diabetic ulcer Location: Toe (Comment  which one) , 4th  Location Orientation: Left , plantar aspect   Present on Admission: No   Selective Debridement - Location Plantar great toe, superior great toe and 4th toe   Selective Debridement - Tools Used Forceps;Scalpel;Scissors   Selective Debridement - Tissue Removed callous, slough; epiboled edges    Wound Therapy - Clinical Statement Inferior great toe wound is now healed with only callous remaining.  Increased callous also present on 4th toe.  Able to shave callous away from these areas.  Increased approximation great toe wound with overall reduction in size.  Area of focus in 12-3 o"clock where edge of wound is edipole without approximation.  Inferior great toe wound is now healed.   Dressing secured with #3 netting.    Wound Therapy - Functional Problem List difficulty walking    Factors Delaying/Impairing Wound Healing Altered sensation;Diabetes Mellitus;Infection - systemic/local;Multiple medical problems;Vascular compromise   Hydrotherapy Plan Debridement;Dressing change;Patient/family education   Wound Therapy - Frequency 3X / week   Wound Therapy - Current Recommendations PT   Wound Plan   Continue wound care with approriate dressings to Lt LE.    Dressing  silver hydrofiber with 2x2 and gauze and netting   Decrease Necrotic Tissue to STG:   2 weeks 0   Increase Granulation Tissue to STG:  2 weeks 100   Decrease Length/Width/Depth by (cm) STG:  3 weeks:  decrease both wound sites by 50%;  LTG:  6 weeks:  Decrease both wound sites by 75 %    Additional Wound Therapy Goal PT to verbalize the importance of maintaining good blood sugars for optimal healing.                               Problem List Patient Active Problem List   Diagnosis Date Noted  . Delirium 01/13/2014   . Diastolic CHF, acute on chronic (HCC) 01/13/2014  . Expressive aphasia 01/13/2014  . Acute encephalopathy 03/16/2013  . Acute on chronic renal failure ([place]) 03/16/2013  . Hypoglycemia 03/16/2013  . CHF (congestive heart failure) ([place])   . Unstable balance 01/16/2013  . Difficulty in walking(719.7) 01/16/2013  . Infected wound ([place]) 01/16/2013  . Hx of right BKA ([place]) 12/23/2012  . Candidiasis of breast 12/08/2012  . S/P bilateral BKA (below knee amputation) ([place]) 11/30/2012  . Diabetes ([place]) 11/30/2012  . Diabetic foot ulcer ([place]) 10/08/2012  . UTI (lower urinary tract infection) 10/07/2012  . Bacteremia due to Staphylococcus aureus 10/07/2012  . Anxiety 02/17/2012  . Osteomyelitis of right foot ([place]) 02/14/2012  .  Hematuria, microscopic 02/13/2012  . Hyponatremia 02/13/2012  . PVD (peripheral vascular disease) ([place]) 02/08/2012  . Open wound of heel 02/08/2012  . Hyperlipidemia 11/11/2011  . Hypertension 11/11/2011  . Morbid obesity ([place]) 11/11/2011  . Tachycardia 08/04/2011  . Acute exacerbation of CHF (congestive heart failure) ([place]) 07/25/2011  . Diabetes mellitus with neuropathy 07/25/2011  . OSA (obstructive sleep apnea) 07/25/2011  . Acute bronchitis 07/25/2011  . Generalized weakness 07/25/2011  . Acute respiratory failure ([place]) 07/25/2011  . Restless leg syndrome 07/25/2011  . Anemia 07/25/2011    Roseanne Reno B 05/15/2015, 10:51 AM  [place], [place], [address] Phone: [phone]   Fax:  [phone]  Name: Mary [name] MRN: [id number] Date of Birth: Sep 06, 1945

## 2015-06-04 ENCOUNTER — Ambulatory Visit (HOSPITAL_COMMUNITY): Payer: PPO

## 2015-06-10 ENCOUNTER — Ambulatory Visit (HOSPITAL_COMMUNITY): Payer: PPO | Admitting: Physical Therapy

## 2015-06-12 ENCOUNTER — Ambulatory Visit (HOSPITAL_COMMUNITY): Payer: PPO | Attending: Internal Medicine

## 2015-06-12 DIAGNOSIS — S91001A Unspecified open wound, right ankle, initial encounter: Secondary | ICD-10-CM | POA: Insufficient documentation

## 2015-06-12 DIAGNOSIS — R2689 Other abnormalities of gait and mobility: Secondary | ICD-10-CM | POA: Diagnosis present

## 2015-06-12 DIAGNOSIS — R296 Repeated falls: Secondary | ICD-10-CM | POA: Diagnosis present

## 2015-06-12 DIAGNOSIS — T798XXD Other early complications of trauma, subsequent encounter: Secondary | ICD-10-CM | POA: Insufficient documentation

## 2015-06-12 DIAGNOSIS — S81801A Unspecified open wound, right lower leg, initial encounter: Secondary | ICD-10-CM | POA: Diagnosis present

## 2015-06-12 DIAGNOSIS — R600 Localized edema: Secondary | ICD-10-CM | POA: Diagnosis present

## 2015-06-12 DIAGNOSIS — T148 Other injury of unspecified body region: Secondary | ICD-10-CM | POA: Diagnosis present

## 2015-06-12 DIAGNOSIS — R29898 Other symptoms and signs involving the musculoskeletal system: Secondary | ICD-10-CM | POA: Insufficient documentation

## 2015-06-12 DIAGNOSIS — T148XXA Other injury of unspecified body region, initial encounter: Secondary | ICD-10-CM

## 2015-06-12 DIAGNOSIS — T798XXS Other early complications of trauma, sequela: Secondary | ICD-10-CM | POA: Diagnosis present

## 2015-06-12 DIAGNOSIS — S81001A Unspecified open wound, right knee, initial encounter: Secondary | ICD-10-CM | POA: Insufficient documentation

## 2015-06-12 NOTE — Therapy (Signed)
[place], [place], [address] Phone: [phone]   Fax:  [phone]  Wound Care Therapy  Patient Details  Name: Mary [name] MRN: [id number] Date of Birth: 01-24-1946 No Data Recorded  Encounter Date: 06/12/2015      PT End of Session - 06/12/15 0853    Visit Number 16   Number of Visits 24   Authorization Type Aetna Medicare   Authorization - Visit Number 16   Authorization - Number of Visits 20   PT Start Time 0800   PT Stop Time 0838   PT Time Calculation (min) 38 min   Activity Tolerance Patient tolerated treatment well   Behavior During Therapy [facility] for tasks assessed/performed      Past Medical History  Diagnosis Date  . CHF (congestive heart failure)   . Diabetes mellitus   . Restless leg syndrome 07/25/2011  . Wound, open     right foot  . Diabetes mellitus with neuropathy 07/25/2011  . Cellulitis and abscess of foot 02/12/2012  . Osteomyelitis of right foot 02/14/2012  . Partial Achilles tendon tear 02/14/2012  . Dysrhythmia     tachy- takes Metoprolol  . OSA (obstructive sleep apnea) 07/25/2011    not using CPAP  . Arthritis     knees  . Anemia   . Complication of anesthesia     pt states after her hysterectomy the doctor said she was "wild" when she woke up     Past Surgical History  Procedure Laterality Date  . Abdominal hysterectomy    . Achilles tendon repair    . Toe amputation      partial rt great toe  . Below knee leg amputation Right 11/25/2012    Dr Sharol Given  . Amputation Right 11/25/2012    Procedure: AMPUTATION BELOW KNEE;  Surgeon: Newt Minion, MD;  Location: [facility];  Service: Orthopedics;  Laterality: Right;  Right Below Knee Amputation  . Breast surgery Left     Lumpectomy non ca  . Cataract extraction w/phaco Right 10/09/2013    Procedure: CATARACT EXTRACTION PHACO AND INTRAOCULAR LENS PLACEMENT (IOC);  Surgeon: Elta Guadeloupe T. Gershon Crane, MD;  Location: [facility];  Service:  Ophthalmology;  Laterality: Right;  CDE:9.05  . Cataract extraction w/phaco Left 10/23/2013    Procedure: ATTEMPTED CATARACT EXTRACTION PHACO AND INTRAOCULAR LENS PLACEMENT;  Surgeon: Elta Guadeloupe T. Gershon Crane, MD;  Location: [facility];  Service: Ophthalmology;  Laterality: Left;  CDE:  4.41    There were no vitals filed for this visit.  Visit Diagnosis:  Nonhealing nonsurgical wound  Infected wound, sequela  Edema of foot  Open wound of knee, leg (except thigh), and ankle, complicated, right, initial encounter  Unstable balance  Infected wound, subsequent encounter      Subjective Assessment - 06/12/15 0832    Subjective Pt arrived with no dressing on foot, reports she feels wound is healed.  Reports new wound on Rt LE.  Reports 2 falls recently due to [place] giving in, feels really weak.   Currently in Pain? No/denies                   Wound Therapy - 06/12/15 0833    Subjective Pt arrived with no dressing on foot, reports she feels wound is healed.  Reports new wound on Rt LE.  Reports 2 falls recently due to [place] giving in, feels really weak.   Patient and Family Stated Goals wound to heal  Date of Onset 02/19/15   Prior Treatments self treat, now on antibiotics.    Pain Assessment No/denies pain   Evaluation and Treatment Procedures Explained to Patient/Family Yes   Evaluation and Treatment Procedures agreed to   Wound Properties Date First Assessed: 04/22/15 Time First Assessed: 0815 Wound Type: Diabetic ulcer Location: Toe (Comment  which one) , Great  Location Orientation: Left Wound Description (Comments): Plantar aspect Lt great toe Present on Admission: No Final Assessment Date: 06/12/15 Final Assessment Time: 0838   Dressing Type None   Dressing Status Other (Comment)  100% granulated   Dressing Change Frequency PRN   Site / Wound Assessment Granulation tissue   % Wound base Red or Granulating 100%   % Wound base Yellow 0%   % Wound base Black 0%   Peri-wound  Assessment Other (Comment)  [place]   Wound Length (cm) 0 cm   Wound Width (cm) 0 cm   Wound Depth (cm) 0 cm   Drainage Amount None   Non-staged Wound Description Full thickness   Treatment Debridement (Selective)  Removal of dry skin   Selective Debridement - Location interior great toe   Selective Debridement - Tools Used Scalpel;Forceps   Selective Debridement - Tissue Removed callous and dry skin   Wound Therapy - Clinical Statement Pt has not received wound treatments in a month due to weather and sickness.  Reassessment complete with the following findings: All wounds fully healed Lt foot, selective debridement for removal of callous and dry skin inferior great toe to assess full granulation.  Pt entered with new wound on anterior shin Rt LE W.8cm, L.9cm and depth .1 cm and blister distal extremity.  No treatment complete to new wound, referal sent to MD for treatment.  Pt encouraged to remove prostetic limb for pressure relief.  Discussion held about resuming past HEP for Lt LE strengthening following reports of falls due to weakness.  Also discussed importance of checking blood sugar daily.  Pt with MD apt later today.    Wound Therapy - Functional Problem List difficulty walking    Factors Delaying/Impairing Wound Healing Altered sensation;Diabetes Mellitus;Infection - systemic/local;Multiple medical problems;Vascular compromise   Hydrotherapy Plan Debridement;Dressing change;Patient/family education   Wound Therapy - Frequency 3X / week   Wound Therapy - Current Recommendations PT   Wound Plan D/C Lt LE wound care.  Referral sent to MD for new wound Rt LE   Dressing  no dressings   Decrease Necrotic Tissue to STG:   2 weeks 0   Decrease Necrotic Tissue - Progress Met   Increase Granulation Tissue to STG:  2 weeks 100   Increase Granulation Tissue - Progress Met   Decrease Length/Width/Depth by (cm) STG:  3 weeks:  decrease both wound sites by 50%;  LTG:  6 weeks:  Decrease both wound  sites by 75 %    Decrease Length/Width/Depth - Progress Met   Additional Wound Therapy Goal PT to verbalize the importance of maintaining good blood sugars for optimal healing.    Additional Wound Therapy Goal - Progress Partly met  Able to verbalize importance/ inconsistent check sugar leve                Problem List Patient Active Problem List   Diagnosis Date Noted  . Delirium 01/13/2014  . Diastolic CHF, acute on chronic (HCC) 01/13/2014  . Expressive aphasia 01/13/2014  . Acute encephalopathy 03/16/2013  . Acute on chronic renal failure ([place]) 03/16/2013  . Hypoglycemia 03/16/2013  .  CHF (congestive heart failure) ([place])   . Unstable balance 01/16/2013  . Difficulty in walking(719.7) 01/16/2013  . Infected wound ([place]) 01/16/2013  . Hx of right BKA ([place]) 12/23/2012  . Candidiasis of breast 12/08/2012  . S/P bilateral BKA (below knee amputation) ([place]) 11/30/2012  . Diabetes ([place]) 11/30/2012  . Diabetic foot ulcer ([place]) 10/08/2012  . UTI (lower urinary tract infection) 10/07/2012  . Bacteremia due to Staphylococcus aureus 10/07/2012  . Anxiety 02/17/2012  . Osteomyelitis of right foot ([place]) 02/14/2012  . Hematuria, microscopic 02/13/2012  . Hyponatremia 02/13/2012  . PVD (peripheral vascular disease) ([place]) 02/08/2012  . Open wound of heel 02/08/2012  . Hyperlipidemia 11/11/2011  . Hypertension 11/11/2011  . Morbid obesity ([place]) 11/11/2011  . Tachycardia 08/04/2011  . Acute exacerbation of CHF (congestive heart failure) ([place]) 07/25/2011  . Diabetes mellitus with neuropathy 07/25/2011  . OSA (obstructive sleep apnea) 07/25/2011  . Acute bronchitis 07/25/2011  . Generalized weakness 07/25/2011  . Acute respiratory failure ([place]) 07/25/2011  . Restless leg syndrome 07/25/2011  . Anemia 07/25/2011   PHYSICAL THERAPY DISCHARGE SUMMARY  Visits from Start of Care: 16 Current functional level related to goals / functional outcomes: Wound for which pt was referred  for has healed.  Pt has a new wound on her opposite leg.  Remaining deficits: None for reason pt was referred.   Education / Equipment:   Plan: Patient agrees to discharge.  Patient goals were met. Patient is being discharged due to meeting the stated rehab goals.  ?????  New prescription was sent for treatment on new wound. Will assess once order has returned.       Rayetta Humphrey, PT CLT [phone], [place]; [place]   [facility] [address] [place], [place], [address] Phone: [phone]   Fax:  [phone]  Name: RANDI POULLARD MRN: [id number] Date of Birth: July 13, 1945

## 2015-06-13 ENCOUNTER — Ambulatory Visit (HOSPITAL_COMMUNITY): Payer: PPO | Admitting: Physical Therapy

## 2015-06-13 DIAGNOSIS — R296 Repeated falls: Secondary | ICD-10-CM

## 2015-06-13 DIAGNOSIS — R2689 Other abnormalities of gait and mobility: Secondary | ICD-10-CM

## 2015-06-13 DIAGNOSIS — R29898 Other symptoms and signs involving the musculoskeletal system: Secondary | ICD-10-CM

## 2015-06-13 DIAGNOSIS — T148 Other injury of unspecified body region: Secondary | ICD-10-CM | POA: Diagnosis not present

## 2015-06-13 DIAGNOSIS — T148XXA Other injury of unspecified body region, initial encounter: Secondary | ICD-10-CM

## 2015-06-13 NOTE — Therapy (Signed)
[place], [place], [address] Phone: [phone]   Fax:  [phone]  Physical Therapy Evaluation  Patient Details  Name: Mary [name] MRN: [id number] Date of Birth: 02-03-1946 Referring Provider: Nevada Crane  Encounter Date: 06/13/2015      PT End of Session - 06/13/15 1634    Visit Number 1   Number of Visits 18   Date for PT Re-Evaluation 07/13/15   Authorization Type [employer] Medicare   Authorization - Visit Number 1   Authorization - Number of Visits 18   PT Start Time [id number]   PT Stop Time 1600   PT Time Calculation (min) 45 min   Activity Tolerance Patient tolerated treatment well   Behavior During Therapy [facility] for tasks assessed/performed      Past Medical History  Diagnosis Date  . CHF (congestive heart failure)   . Diabetes mellitus   . Restless leg syndrome 07/25/2011  . Wound, open     right foot  . Diabetes mellitus with neuropathy 07/25/2011  . Cellulitis and abscess of foot 02/12/2012  . Osteomyelitis of right foot 02/14/2012  . Partial Achilles tendon tear 02/14/2012  . Dysrhythmia     tachy- takes Metoprolol  . OSA (obstructive sleep apnea) 07/25/2011    not using CPAP  . Arthritis     knees  . Anemia   . Complication of anesthesia     pt states after her hysterectomy the doctor said she was "wild" when she woke up     Past Surgical History  Procedure Laterality Date  . Abdominal hysterectomy    . Achilles tendon repair    . Toe amputation      partial rt great toe  . Below knee leg amputation Right 11/25/2012    Dr Sharol Given  . Amputation Right 11/25/2012    Procedure: AMPUTATION BELOW KNEE;  Surgeon: Newt Minion, MD;  Location: [facility];  Service: Orthopedics;  Laterality: Right;  Right Below Knee Amputation  . Breast surgery Left     Lumpectomy non ca  . Cataract extraction w/phaco Right 10/09/2013    Procedure: CATARACT EXTRACTION PHACO AND INTRAOCULAR LENS PLACEMENT (IOC);  Surgeon: Elta Guadeloupe  T. Gershon Crane, MD;  Location: [facility];  Service: Ophthalmology;  Laterality: Right;  CDE:9.05  . Cataract extraction w/phaco Left 10/23/2013    Procedure: ATTEMPTED CATARACT EXTRACTION PHACO AND INTRAOCULAR LENS PLACEMENT;  Surgeon: Elta Guadeloupe T. Gershon Crane, MD;  Location: [facility];  Service: Ophthalmology;  Laterality: Left;  CDE:  4.41    There were no vitals filed for this visit.  Visit Diagnosis:  Nonhealing nonsurgical wound - Plan: PT plan of care cert/re-cert  Unstable balance - Plan: PT plan of care cert/re-cert  Falls frequently - Plan: PT plan of care cert/re-cert  Leg weakness, bilateral - Plan: PT plan of care cert/re-cert      Subjective Assessment - 06/13/15 1615    Subjective Pt states that she has had three wounds on her Rt LE that have not healed.  She knows that they have been there for at least three weeks,(maybe longer).  [name] also states that her Lt leg gives way on her and she has been falling on a regular basis.     Pertinent History Rt BKA ; Diabetic, obesity   How long can you sit comfortably? no problem    How long can you stand comfortably? tested = 1:45 without holding onto the walker   How long can you  walk comfortably? only walks short distances less than 3-5 minutes    Patient Stated Goals wounds to heal and to have less falls    Currently in Pain? No/denies            [facility] PT Assessment - 06/13/15 0001    Assessment   Medical Diagnosis nonhealing wound; multiple falls   Referring Provider Nevada Crane   Onset Date/Surgical Date 05/23/15   Next MD Visit unknown   Prior Therapy for wounds on her Lt LE    Precautions   Precautions Fall   Restrictions   Weight Bearing Restrictions No   Balance Screen   Has the patient fallen in the past 6 months Yes   How many times? 10  at least   Has the patient had a decrease in activity level because of a fear of falling?  Yes   Is the patient reluctant to leave their home because of a fear of falling?  Yes   [place] residence   Prior Function   Level of [place] device for independence;Needs assistance with ADLs   Vocation On disability   Leisure none   Cognition   Overall Cognitive Status Within Functional Limits for tasks assessed   Observation/Other Assessments   Skin Integrity 1st wound .6x.8x.2- 90% granulated;2nd distal end of limb 1.3x1.0x.2 cm 90% granulated; 3rd posterior aspcct of distal thigh- wound is in an T form with vertical .5 cm and horizontal 1.0 cm :  Pt has multiple irritated areas on residual limp once prothesis was taken off.  Therapist urged pt to call prothesitist to come and see if prothesis is fitting properly   Functional Tests   Functional tests Single leg stance;Sit to Stand   Single Leg Stance   Comments unable   Sit to Stand   Comments must use UE 5 reps in 24.91 seconds    ROM / Strength   AROM / PROM / Strength Strength   Strength   Strength Assessment Site Hip;Knee;Ankle   Right/Left Hip Left   Left Hip Flexion 3+/5   Left Hip Extension 3-/5   Left Hip ABduction 3/5   Left Hip ADduction 3-/5   Right/Left Knee Left   Left Knee Flexion 5/5   Left Knee Extension 5/5                   [facility] Adult PT Treatment/Exercise - 06/13/15 0001    Wound care    Wound care  wound care:  wounds debrided with sissor and forceps, dressed with medihoney,  gauze and kerlix.                  PT Education - 06/13/15 1634    Education provided Yes   Education Details HEP   Person(s) Educated Patient   Methods Explanation   Comprehension Verbalized understanding          PT Short Term Goals - 06/13/15 1639    PT SHORT TERM GOAL #1   Title Pt wounds to be 100% granualted in order for healing to begin   Time 1   Period Weeks   PT SHORT TERM GOAL #2   Title Pt to be I in HEP to increase strength to improve safety    Time 2   Period Weeks           PT Long Term Goals - 06/13/15  1640    PT LONG TERM GOAL #1  Title PT to be Indpendent in advance  in HEP    Time 6   Period Weeks   PT LONG TERM GOAL #2   Title PT wounds to be 50% smaller    Time 6   Period Weeks   PT LONG TERM GOAL #3   Title PT family to be Independent in dressing wounds.   Time 6   Period Weeks   PT LONG TERM GOAL #4   Title Pt to be able to stand for 5 minutes without UE assist to allow pt to perform self grooming while standing with reduce risk of falling   Time 6   Period Weeks   PT LONG TERM GOAL #5   Title Pt to demonstrate improved power and strength by being able to come sit to stand one time without UE assist for reduced risk of falling    Time 6   Period Weeks               Plan - 06-19-15 1635    Clinical Impression Statement Ms. Banville is a known pt to this clinic who has been seen in the past for non-healing wounds.  She comes to day with three non-heeling wounds on her Rt LE and complaint that her LT LE gives way and she has been falling frequently.  Examination demonstrates three wounds, decreased balance, decreased strength and decreased activity tolerance.  She has been referred to skilled physical therapy to address these issues.    Pt will benefit from skilled therapeutic intervention in order to improve on the following deficits Abnormal gait;Decreased activity tolerance;Decreased balance;Decreased skin integrity;Decreased strength;Difficulty walking;Obesity   Rehab Potential Good   PT Frequency 3x / week   PT Duration 6 weeks   PT Treatment/Interventions Gait training;Therapeutic activities;Therapeutic exercise;Other (comment)  wound care.   PT Next Visit Plan Assess how [place] did for wound if any maceration change to alginate dressing.  Pt to begin mat exercises for LE and core strengthening.  Do not exercise weightbearing until wounds have healed.    PT Home Exercise Plan given    Consulted and Agree with Plan of Care Patient          G-Codes -  Jun 19, 2015 1644    Functional Assessment Tool Used wound granulation; frequency of falls    Functional Limitation Other PT primary   Other PT Primary Current Status [id number]) At least 60 percent but less than 80 percent impaired, limited or restricted   Other PT Primary Goal Status [id number]) At least 40 percent but less than 60 percent impaired, limited or restricted       Problem List Patient Active Problem List   Diagnosis Date Noted  . Delirium 01/13/2014  . Diastolic CHF, acute on chronic (HCC) 01/13/2014  . Expressive aphasia 01/13/2014  . Acute encephalopathy 03/16/2013  . Acute on chronic renal failure ([place]) 03/16/2013  . Hypoglycemia 03/16/2013  . CHF (congestive heart failure) ([place])   . Unstable balance 01/16/2013  . Difficulty in walking(719.7) 01/16/2013  . Infected wound ([place]) 01/16/2013  . Hx of right BKA ([place]) 12/23/2012  . Candidiasis of breast 12/08/2012  . S/P bilateral BKA (below knee amputation) ([place]) 11/30/2012  . Diabetes ([place]) 11/30/2012  . Diabetic foot ulcer ([place]) 10/08/2012  . UTI (lower urinary tract infection) 10/07/2012  . Bacteremia due to Staphylococcus aureus 10/07/2012  . Anxiety 02/17/2012  . Osteomyelitis of right foot ([place]) 02/14/2012  . Hematuria, microscopic 02/13/2012  . Hyponatremia 02/13/2012  . PVD (peripheral  vascular disease) ([place]) 02/08/2012  . Open wound of heel 02/08/2012  . Hyperlipidemia 11/11/2011  . Hypertension 11/11/2011  . Morbid obesity ([place]) 11/11/2011  . Tachycardia 08/04/2011  . Acute exacerbation of CHF (congestive heart failure) ([place]) 07/25/2011  . Diabetes mellitus with neuropathy 07/25/2011  . OSA (obstructive sleep apnea) 07/25/2011  . Acute bronchitis 07/25/2011  . Generalized weakness 07/25/2011  . Acute respiratory failure ([place]) 07/25/2011  . Restless leg syndrome 07/25/2011  . Anemia 07/25/2011    Rayetta Humphrey, PT CLT [phone] 06/13/2015, 4:48 PM  [facility] [address] [place], [place], [address] Phone: [phone]   Fax:  [phone]  Name: CLAUDIA NEUGEBAUER MRN: [id number] Date of Birth: May 23, 1946

## 2015-06-13 NOTE — Patient Instructions (Signed)
Strengthening: Straight Leg Raise (Phase 1)    Tighten muscles on front of right thigh, then lift leg _10___ inches from surface, keeping knee locked.  Repeat _10-15___ times per set. Do __1__ sets per session. Do 2____ sessions per day.  http://orth.exer.us/614   Copyright  VHI. All rights reserved.  Strengthening: Hip Abduction (Side-Lying)    Tighten muscles on front of left thigh, then lift leg __10-15__ inches from surface, keeping knee locked.  Repeat _10-15___ times per set. Do ___1_ sets per session. Do __2__ sessions per day. Repeat to the right http://orth.exer.us/622   Copyright  VHI. All rights reserved.  Bridging: with Straight Leg Raise    With R leg bent, lift buttocks __5__ inches from floor. , keeping stomach tight. Repeat __10__ times per set. Do _1___ sets per session. Do __2__ sessions per day.  http://orth.exer.us/1104   Copyright  VHI. All rights reserved.

## 2015-06-17 ENCOUNTER — Ambulatory Visit (HOSPITAL_COMMUNITY): Payer: PPO | Admitting: Physical Therapy

## 2015-06-17 DIAGNOSIS — T148 Other injury of unspecified body region: Secondary | ICD-10-CM | POA: Diagnosis not present

## 2015-06-17 DIAGNOSIS — T148XXA Other injury of unspecified body region, initial encounter: Secondary | ICD-10-CM

## 2015-06-17 DIAGNOSIS — R29898 Other symptoms and signs involving the musculoskeletal system: Secondary | ICD-10-CM

## 2015-06-17 DIAGNOSIS — R2689 Other abnormalities of gait and mobility: Secondary | ICD-10-CM

## 2015-06-17 DIAGNOSIS — R296 Repeated falls: Secondary | ICD-10-CM

## 2015-06-17 NOTE — Therapy (Signed)
[place], [place], [address] Phone: [phone]   Fax:  [phone]  Physical Therapy Treatment  Patient Details  Name: Mary [name] MRN: [id number] Date of Birth: 1945/08/27 Referring Provider: Nevada Crane  Encounter Date: 06/17/2015      PT End of Session - 06/17/15 0857    Visit Number 2   Number of Visits 18   Date for PT Re-Evaluation 07/13/15   Authorization Type A[employer]na Medicare   Authorization - Visit Number 2   Authorization - Number of Visits 18   PT Start Time 0803   PT Stop Time [id number]   PT Time Calculation (min) 41 min   Activity Tolerance Patient tolerated treatment well   Behavior During Therapy [facility] for tasks assessed/performed      Past Medical History  Diagnosis Date  . CHF (congestive heart failure)   . Diabetes mellitus   . Restless leg syndrome 07/25/2011  . Wound, open     right foot  . Diabetes mellitus with neuropathy 07/25/2011  . Cellulitis and abscess of foot 02/12/2012  . Osteomyelitis of right foot 02/14/2012  . Partial Achilles tendon tear 02/14/2012  . Dysrhythmia     tachy- takes Metoprolol  . OSA (obstructive sleep apnea) 07/25/2011    not using CPAP  . Arthritis     knees  . Anemia   . Complication of anesthesia     pt states after her hysterectomy the doctor said she was "wild" when she woke up     Past Surgical History  Procedure Laterality Date  . Abdominal hysterectomy    . Achilles tendon repair    . Toe amputation      partial rt great toe  . Below knee leg amputation Right 11/25/2012    Dr Sharol Given  . Amputation Right 11/25/2012    Procedure: AMPUTATION BELOW KNEE;  Surgeon: Newt Minion, MD;  Location: [facility];  Service: Orthopedics;  Laterality: Right;  Right Below Knee Amputation  . Breast surgery Left     Lumpectomy non ca  . Cataract extraction w/phaco Right 10/09/2013    Procedure: CATARACT EXTRACTION PHACO AND INTRAOCULAR LENS PLACEMENT (IOC);  Surgeon: Elta Guadeloupe  T. Gershon Crane, MD;  Location: [facility];  Service: Ophthalmology;  Laterality: Right;  CDE:9.05  . Cataract extraction w/phaco Left 10/23/2013    Procedure: ATTEMPTED CATARACT EXTRACTION PHACO AND INTRAOCULAR LENS PLACEMENT;  Surgeon: Elta Guadeloupe T. Gershon Crane, MD;  Location: [facility];  Service: Ophthalmology;  Laterality: Left;  CDE:  4.41    There were no vitals filed for this visit.  Visit Diagnosis:  Nonhealing nonsurgical wound  Unstable balance  Falls frequently  Leg weakness, bilateral      Subjective Assessment - 06/17/15 0849    Subjective Pt states that she kept her prothesis on all day, even though therapist told her to keep the prothesis off as much as possible.  Pt states she had diarrhea and needed to keep the prothesis on in order to get to the bathroom in time.  Pt states due to having prothesis on her wounds are macerated. Pt has only completed single leg bridging from her HEP given to her last treatment.    Pertinent History Rt BKA ; Diabetic, obesity   Currently in Pain? No/denies                         [facility] Adult PT Treatment/Exercise - 06/17/15 0851    Exercises  Exercises Knee/Hip   Knee/Hip Exercises: Standing   Other Standing Knee Exercises no UE assist x 2:00   Knee/Hip Exercises: Supine   Single Leg Bridge Left;10 reps   Straight Leg Raises Strengthening;Both;10 reps   Other Supine Knee/Hip Exercises pilates 100,(pt only completed 4 sets of 10)    Knee/Hip Exercises: Sidelying   Hip ABduction Strengthening;Both;10 reps   Manual Therapy   Wound therapy  Pt Rt LE is macerated.  LE washed followed by debridement of wounds, (slough from anterior wounds callous from posterior wound), dressing changed to sillverhydrofiber followed by 4x4 and kerlix.  Vaseline to periphery of wounds to protect from moisture.                 PT Education - 06/17/15 0856    Education provided Yes   Education Details pilates 100   Person(s) Educated Patient    Methods Explanation;Demonstration   Comprehension Returned demonstration          PT Short Term Goals - 06/13/15 1639    PT SHORT TERM GOAL #1   Title Pt wounds to be 100% granualted in order for healing to begin   Time 1   Period Weeks   PT SHORT TERM GOAL #2   Title Pt to be I in HEP to increase strength to improve safety    Time 2   Period Weeks           PT Long Term Goals - 06/13/15 1640    PT LONG TERM GOAL #1   Title PT to be Indpendent in advance  in HEP    Time 6   Period Weeks   PT LONG TERM GOAL #2   Title PT wounds to be 50% smaller    Time 6   Period Weeks   PT LONG TERM GOAL #3   Title PT family to be Independent in dressing wounds.   Time 6   Period Weeks   PT LONG TERM GOAL #4   Title Pt to be able to stand for 5 minutes without UE assist to allow pt to perform self grooming while standing with reduce risk of falling   Time 6   Period Weeks   PT LONG TERM GOAL #5   Title Pt to demonstrate improved power and strength by being able to come sit to stand one time without UE assist for reduced risk of falling    Time 6   Period Weeks               Plan - 06/17/15 [id number]    Clinical Impression Statement Pt has increased maceration of wounds.  Pt dressing changed from [place] to silverhydrofiber.  Pt began strengthening which was difficult for her to complete.  Urged pt to complete HEP three times a day.    Pt will benefit from skilled therapeutic intervention in order to improve on the following deficits Abnormal gait;Decreased activity tolerance;Decreased balance;Decreased skin integrity;Decreased strength;Difficulty walking;Obesity   PT Next Visit Plan begin supine chest press with 3# in UE.  If time complete supine posture exercise with theraband if not complete on 4th treatment.         Problem List Patient Active Problem List   Diagnosis Date Noted  . Delirium 01/13/2014  . Diastolic CHF, acute on chronic (HCC) 01/13/2014  . Expressive  aphasia 01/13/2014  . Acute encephalopathy 03/16/2013  . Acute on chronic renal failure ([place]) 03/16/2013  . Hypoglycemia 03/16/2013  . CHF (congestive heart failure) (  [place])   . Unstable balance 01/16/2013  . Difficulty in walking(719.7) 01/16/2013  . Infected wound ([place]) 01/16/2013  . Hx of right BKA ([place]) 12/23/2012  . Candidiasis of breast 12/08/2012  . S/P bilateral BKA (below knee amputation) ([place]) 11/30/2012  . Diabetes ([place]) 11/30/2012  . Diabetic foot ulcer ([place]) 10/08/2012  . UTI (lower urinary tract infection) 10/07/2012  . Bacteremia due to Staphylococcus aureus 10/07/2012  . Anxiety 02/17/2012  . Osteomyelitis of right foot ([place]) 02/14/2012  . Hematuria, microscopic 02/13/2012  . Hyponatremia 02/13/2012  . PVD (peripheral vascular disease) ([place]) 02/08/2012  . Open wound of heel 02/08/2012  . Hyperlipidemia 11/11/2011  . Hypertension 11/11/2011  . Morbid obesity ([place]) 11/11/2011  . Tachycardia 08/04/2011  . Acute exacerbation of CHF (congestive heart failure) ([place]) 07/25/2011  . Diabetes mellitus with neuropathy 07/25/2011  . OSA (obstructive sleep apnea) 07/25/2011  . Acute bronchitis 07/25/2011  . Generalized weakness 07/25/2011  . Acute respiratory failure ([place]) 07/25/2011  . Restless leg syndrome 07/25/2011  . Anemia 07/25/2011   Rayetta Humphrey, PT CLT [phone] 06/17/2015, 9:03 AM  [facility] [address] [place], [place], [address] Phone: [phone]   Fax:  [phone]  Name: Mary [name] MRN: [id number] Date of Birth: 1945/11/23

## 2015-06-19 ENCOUNTER — Ambulatory Visit (HOSPITAL_COMMUNITY): Payer: PPO | Admitting: Physical Therapy

## 2015-06-19 DIAGNOSIS — R600 Localized edema: Secondary | ICD-10-CM

## 2015-06-19 DIAGNOSIS — T148XXA Other injury of unspecified body region, initial encounter: Secondary | ICD-10-CM

## 2015-06-19 DIAGNOSIS — R29898 Other symptoms and signs involving the musculoskeletal system: Secondary | ICD-10-CM

## 2015-06-19 DIAGNOSIS — T798XXS Other early complications of trauma, sequela: Secondary | ICD-10-CM

## 2015-06-19 DIAGNOSIS — R296 Repeated falls: Secondary | ICD-10-CM

## 2015-06-19 DIAGNOSIS — S91001A Unspecified open wound, right ankle, initial encounter: Secondary | ICD-10-CM

## 2015-06-19 DIAGNOSIS — T798XXD Other early complications of trauma, subsequent encounter: Secondary | ICD-10-CM

## 2015-06-19 DIAGNOSIS — T148 Other injury of unspecified body region: Secondary | ICD-10-CM | POA: Diagnosis not present

## 2015-06-19 DIAGNOSIS — S81801A Unspecified open wound, right lower leg, initial encounter: Secondary | ICD-10-CM

## 2015-06-19 DIAGNOSIS — S81001A Unspecified open wound, right knee, initial encounter: Secondary | ICD-10-CM

## 2015-06-19 DIAGNOSIS — R2689 Other abnormalities of gait and mobility: Secondary | ICD-10-CM

## 2015-06-19 NOTE — Therapy (Signed)
[place], [place], [address] Phone: [phone]   Fax:  [phone]  Physical Therapy Treatment  Patient Details  Name: Mary [name] MRN: [id number] Date of Birth: 1945/11/14 Referring Provider: Nevada Crane  Encounter Date: 06/19/2015      PT End of Session - 06/19/15 1102    Visit Number 3   Number of Visits 18   Date for PT Re-Evaluation 07/13/15   Authorization Type [employer] Medicare   Authorization - Visit Number 3   Authorization - Number of Visits 10   PT Start Time 0800   PT Stop Time 0848   PT Time Calculation (min) 48 min   Activity Tolerance Patient tolerated treatment well   Behavior During Therapy [facility] for tasks assessed/performed      Past Medical History  Diagnosis Date  . CHF (congestive heart failure)   . Diabetes mellitus   . Restless leg syndrome 07/25/2011  . Wound, open     right foot  . Diabetes mellitus with neuropathy 07/25/2011  . Cellulitis and abscess of foot 02/12/2012  . Osteomyelitis of right foot 02/14/2012  . Partial Achilles tendon tear 02/14/2012  . Dysrhythmia     tachy- takes Metoprolol  . OSA (obstructive sleep apnea) 07/25/2011    not using CPAP  . Arthritis     knees  . Anemia   . Complication of anesthesia     pt states after her hysterectomy the doctor said she was "wild" when she woke up     Past Surgical History  Procedure Laterality Date  . Abdominal hysterectomy    . Achilles tendon repair    . Toe amputation      partial rt great toe  . Below knee leg amputation Right 11/25/2012    Dr Sharol Given  . Amputation Right 11/25/2012    Procedure: AMPUTATION BELOW KNEE;  Surgeon: Newt Minion, MD;  Location: [place];  Service: Orthopedics;  Laterality: Right;  Right Below Knee Amputation  . Breast surgery Left     Lumpectomy non ca  . Cataract extraction w/phaco Right 10/09/2013    Procedure: CATARACT EXTRACTION PHACO AND INTRAOCULAR LENS PLACEMENT (IOC);  Surgeon: Elta Guadeloupe  T. Gershon Crane, MD;  Location: [facility];  Service: Ophthalmology;  Laterality: Right;  CDE:9.05  . Cataract extraction w/phaco Left 10/23/2013    Procedure: ATTEMPTED CATARACT EXTRACTION PHACO AND INTRAOCULAR LENS PLACEMENT;  Surgeon: Elta Guadeloupe T. Gershon Crane, MD;  Location: [facility];  Service: Ophthalmology;  Laterality: Left;  CDE:  4.41    There were no vitals filed for this visit.  Visit Diagnosis:  Nonhealing nonsurgical wound  Unstable balance  Falls frequently  Leg weakness, bilateral  Infected wound, sequela  Edema of foot  Open wound of knee, leg (except thigh), and ankle, complicated, right, initial encounter  Infected wound, subsequent encounter      Subjective Assessment - 06/19/15 1110    Subjective Pt states her bandages fell off shortly after leaving last session.  PT states she is keeping her prosthesis off as much as possible.     Currently in Pain? No/denies                         [facility] Adult PT Treatment/Exercise - 06/19/15 0001    Knee/Hip Exercises: Supine   Single Leg Bridge Left;10 reps   Straight Leg Raises Strengthening;Both;10 reps   Other Supine Knee/Hip Exercises pilates 100,(pt only completed 4 sets of 10)  Other Supine Knee/Hip Exercises chest press with 3# bar holding 3# weight 10 reps X 2 sets   Knee/Hip Exercises: Sidelying   Hip ABduction Strengthening;Both;10 reps   Knee/Hip Exercises: Prone   Hamstring Curl 10 reps   Hip Extension Both;10 reps   Manual Therapy   Manual therapy comments Pt Rt LE bandages came off shortly after leaving clinic last visit.  Wounds remain macerated from where skin sweats beneath prosthesis  LE washed followed by debridement of wounds, (slough from anterior wounds callous from posterior wound), dressing with sillverhydrofiber followed by 2X2 and medipore tape anterior superior, at tip of residual limb and posterior superio.  Vaseline to periphery of wounds to protect from moisture.                    PT Short Term Goals - 06/13/15 1639    PT SHORT TERM GOAL #1   Title Pt wounds to be 100% granualted in order for healing to begin   Time 1   Period Weeks   PT SHORT TERM GOAL #2   Title Pt to be I in HEP to increase strength to improve safety    Time 2   Period Weeks           PT Long Term Goals - 06/13/15 1640    PT LONG TERM GOAL #1   Title PT to be Indpendent in advance  in HEP    Time 6   Period Weeks   PT LONG TERM GOAL #2   Title PT wounds to be 50% smaller    Time 6   Period Weeks   PT LONG TERM GOAL #3   Title PT family to be Independent in dressing wounds.   Time 6   Period Weeks   PT LONG TERM GOAL #4   Title Pt to be able to stand for 5 minutes without UE assist to allow pt to perform self grooming while standing with reduce risk of falling   Time 6   Period Weeks   PT LONG TERM GOAL #5   Title Pt to demonstrate improved power and strength by being able to come sit to stand one time without UE assist for reduced risk of falling    Time 6   Period Weeks               Plan - 06/19/15 1104    Clinical Impression Statement PT with soreness from last session.  continued with therex as instructed last session with addition of UE chest press.  Also added prone hip extension and hamstring curls with tactile cues for form and speed of exercise.  Woundcare completed to Rt residual limb.  Areas remain macerated/raw.  completed dressing with silver hydrofiber to absorb moisutre and prevent infection covered with gauze and medipore tape to secure.    Pt will benefit from skilled therapeutic intervention in order to improve on the following deficits Abnormal gait;Decreased activity tolerance;Decreased balance;Decreased skin integrity;Decreased strength;Difficulty walking;Obesity   PT Next Visit Plan Increase repsa and begin posture exercise with theraband next session.  Continue with proper woundcare as well.          Problem List Patient Active Problem List    Diagnosis Date Noted  . Delirium 01/13/2014  . Diastolic CHF, acute on chronic (HCC) 01/13/2014  . Expressive aphasia 01/13/2014  . Acute encephalopathy 03/16/2013  . Acute on chronic renal failure ([place]) 03/16/2013  . Hypoglycemia 03/16/2013  . CHF (congestive heart failure) (  [place])   . Unstable balance 01/16/2013  . Difficulty in walking(719.7) 01/16/2013  . Infected wound ([place]) 01/16/2013  . Hx of right BKA ([place]) 12/23/2012  . Candidiasis of breast 12/08/2012  . S/P bilateral BKA (below knee amputation) ([place]) 11/30/2012  . Diabetes ([place]) 11/30/2012  . Diabetic foot ulcer ([place]) 10/08/2012  . UTI (lower urinary tract infection) 10/07/2012  . Bacteremia due to Staphylococcus aureus 10/07/2012  . Anxiety 02/17/2012  . Osteomyelitis of right foot ([place]) 02/14/2012  . Hematuria, microscopic 02/13/2012  . Hyponatremia 02/13/2012  . PVD (peripheral vascular disease) ([place]) 02/08/2012  . Open wound of heel 02/08/2012  . Hyperlipidemia 11/11/2011  . Hypertension 11/11/2011  . Morbid obesity ([place]) 11/11/2011  . Tachycardia 08/04/2011  . Acute exacerbation of CHF (congestive heart failure) ([place]) 07/25/2011  . Diabetes mellitus with neuropathy 07/25/2011  . OSA (obstructive sleep apnea) 07/25/2011  . Acute bronchitis 07/25/2011  . Generalized weakness 07/25/2011  . Acute respiratory failure ([place]) 07/25/2011  . Restless leg syndrome 07/25/2011  . Anemia 07/25/2011    Teena Irani, PTA/CLT [phone]  06/19/2015, 11:11 AM  [place], [place], [address] Phone: [phone]   Fax:  [phone]  Name: MAKAILAH TIMBERMAN MRN: [id number] Date of Birth: 08-Sep-1945

## 2015-06-24 ENCOUNTER — Ambulatory Visit (HOSPITAL_COMMUNITY): Payer: PPO

## 2015-06-24 DIAGNOSIS — T798XXS Other early complications of trauma, sequela: Secondary | ICD-10-CM

## 2015-06-24 DIAGNOSIS — T798XXD Other early complications of trauma, subsequent encounter: Secondary | ICD-10-CM

## 2015-06-24 DIAGNOSIS — R29898 Other symptoms and signs involving the musculoskeletal system: Secondary | ICD-10-CM

## 2015-06-24 DIAGNOSIS — S81801A Unspecified open wound, right lower leg, initial encounter: Secondary | ICD-10-CM

## 2015-06-24 DIAGNOSIS — R600 Localized edema: Secondary | ICD-10-CM

## 2015-06-24 DIAGNOSIS — R2689 Other abnormalities of gait and mobility: Secondary | ICD-10-CM

## 2015-06-24 DIAGNOSIS — T148 Other injury of unspecified body region: Secondary | ICD-10-CM | POA: Diagnosis not present

## 2015-06-24 DIAGNOSIS — S81001A Unspecified open wound, right knee, initial encounter: Secondary | ICD-10-CM

## 2015-06-24 DIAGNOSIS — R296 Repeated falls: Secondary | ICD-10-CM

## 2015-06-24 DIAGNOSIS — T148XXA Other injury of unspecified body region, initial encounter: Secondary | ICD-10-CM

## 2015-06-24 DIAGNOSIS — S91001A Unspecified open wound, right ankle, initial encounter: Secondary | ICD-10-CM

## 2015-06-24 NOTE — Therapy (Signed)
[place], [place], [address] Phone: [phone]   Fax:  [phone]  Physical Therapy Treatment  Patient Details  Name: Mary [name] MRN: [id number] Date of Birth: 1946/03/23 Referring Provider: Nevada Crane  Encounter Date: 06/24/2015      PT End of Session - 06/24/15 0834    Visit Number 4   Number of Visits 18   Authorization Type [employer] Medicare   Authorization - Visit Number 4   Authorization - Number of Visits 10   PT Start Time 0803   PT Stop Time 0846   PT Time Calculation (min) 43 min   Activity Tolerance Patient tolerated treatment well   Behavior During Therapy [facility] for tasks assessed/performed      Past Medical History  Diagnosis Date  . CHF (congestive heart failure)   . Diabetes mellitus   . Restless leg syndrome 07/25/2011  . Wound, open     right foot  . Diabetes mellitus with neuropathy 07/25/2011  . Cellulitis and abscess of foot 02/12/2012  . Osteomyelitis of right foot 02/14/2012  . Partial Achilles tendon tear 02/14/2012  . Dysrhythmia     tachy- takes Metoprolol  . OSA (obstructive sleep apnea) 07/25/2011    not using CPAP  . Arthritis     knees  . Anemia   . Complication of anesthesia     pt states after her hysterectomy the doctor said she was "wild" when she woke up     Past Surgical History  Procedure Laterality Date  . Abdominal hysterectomy    . Achilles tendon repair    . Toe amputation      partial rt great toe  . Below knee leg amputation Right 11/25/2012    Dr Sharol Given  . Amputation Right 11/25/2012    Procedure: AMPUTATION BELOW KNEE;  Surgeon: Newt Minion, MD;  Location: [place];  Service: Orthopedics;  Laterality: Right;  Right Below Knee Amputation  . Breast surgery Left     Lumpectomy non ca  . Cataract extraction w/phaco Right 10/09/2013    Procedure: CATARACT EXTRACTION PHACO AND INTRAOCULAR LENS PLACEMENT (IOC);  Surgeon: Elta Guadeloupe T. Gershon Crane, MD;  Location[facility];   Service: Ophthalmology;  Laterality: Right;  CDE:9.05  . Cataract extraction w/phaco Left 10/23/2013    Procedure: ATTEMPTED CATARACT EXTRACTION PHACO AND INTRAOCULAR LENS PLACEMENT;  Surgeon: Elta Guadeloupe T. Gershon Crane, MD;  Location[facility];  Service: Ophthalmology;  Laterality: Left;  CDE:  4.41    There were no vitals filed for this visit.  Visit Diagnosis:  Nonhealing nonsurgical wound  Unstable balance  Falls frequently  Leg weakness, bilateral  Infected wound, sequela  Edema of foot  Open wound of knee, leg (except thigh), and ankle, complicated, right, initial encounter  Infected wound, subsequent encounter      Subjective Assessment - 06/24/15 0833    Subjective Pt arrived with no dressings on leg, reports she has been keeping prosthesis off as much as possible.     Pertinent History Rt BKA ; Diabetic, obesity   Patient Stated Goals wounds to heal and to have less falls    Currently in Pain? No/denies              [facility] Adult PT Treatment/Exercise - 06/24/15 0001    Knee/Hip Exercises: Standing   Other Standing Knee Exercises Posture strengthening 10x each with RTB; scapular retraction, rows and shoulder extension   Knee/Hip Exercises: Supine   Single Leg Bridge Left;15  reps   Straight Leg Raises Strengthening;Both;15 reps   Other Supine Knee/Hip Exercises pilates 100,(pt only completed 4 sets of 10)    Other Supine Knee/Hip Exercises chest press with 3# bar holding 3# weight 10 reps X 2 sets   Knee/Hip Exercises: Sidelying   Hip ABduction Strengthening;Both;15 reps   Knee/Hip Exercises: Prone   Hamstring Curl 15 reps   Hip Extension Both;15 reps   Manual Therapy   Manual therapy comments Wounds remain macerated from where skin sweats beneath prosthesis  LE washed followed by debridement of wounds, (slough from anterior wounds callous from posterior wound), dressing with sillverhydrofiber followed by 2X2, gauze and medipore tape anterior superior, at tip of residual  limb and posterior superio.  Vaseline to periphery of wounds to protect from moisture.                   PT Short Term Goals - 06/13/15 1639    PT SHORT TERM GOAL #1   Title Pt wounds to be 100% granualted in order for healing to begin   Time 1   Period Weeks   PT SHORT TERM GOAL #2   Title Pt to be I in HEP to increase strength to improve safety    Time 2   Period Weeks           PT Long Term Goals - 06/13/15 1640    PT LONG TERM GOAL #1   Title PT to be Indpendent in advance  in HEP    Time 6   Period Weeks   PT LONG TERM GOAL #2   Title PT wounds to be 50% smaller    Time 6   Period Weeks   PT LONG TERM GOAL #3   Title PT family to be Independent in dressing wounds.   Time 6   Period Weeks   PT LONG TERM GOAL #4   Title Pt to be able to stand for 5 minutes without UE assist to allow pt to perform self grooming while standing with reduce risk of falling   Time 6   Period Weeks   PT LONG TERM GOAL #5   Title Pt to demonstrate improved power and strength by being able to come sit to stand one time without UE assist for reduced risk of falling    Time 6   Period Weeks               Plan - 06/24/15 [id number]    Clinical Impression Statement Pt educated on importance of keeping dressings intact until wound is fully healed.  Wound continues to have macerated edges surrounding distal wound.  Following wound care progressed to strengthening, able to increase reps with mat activities with cueing for form and technique.  Progressed to postural strengthening exercises for posture strengthening to improve balance and tolerance without HHA, min guard to reduce risk of falls without HHA.   PT Next Visit Plan Increased reps with posture exercise with theraband next session, progress balance activities..  Continue with proper woundcare as well.          Problem List Patient Active Problem List   Diagnosis Date Noted  . Delirium 01/13/2014  . Diastolic CHF, acute  on chronic (HCC) 01/13/2014  . Expressive aphasia 01/13/2014  . Acute encephalopathy 03/16/2013  . Acute on chronic renal failure ([place]) 03/16/2013  . Hypoglycemia 03/16/2013  . CHF (congestive heart failure) ([place])   . Unstable balance 01/16/2013  . Difficulty in walking(719.7) 01/16/2013  .  Infected wound ([place]) 01/16/2013  . Hx of right BKA ([place]) 12/23/2012  . Candidiasis of breast 12/08/2012  . S/P bilateral BKA (below knee amputation) ([place]) 11/30/2012  . Diabetes ([place]) 11/30/2012  . Diabetic foot ulcer ([place]) 10/08/2012  . UTI (lower urinary tract infection) 10/07/2012  . Bacteremia due to Staphylococcus aureus 10/07/2012  . Anxiety 02/17/2012  . Osteomyelitis of right foot ([place]) 02/14/2012  . Hematuria, microscopic 02/13/2012  . Hyponatremia 02/13/2012  . PVD (peripheral vascular disease) ([place]) 02/08/2012  . Open wound of heel 02/08/2012  . Hyperlipidemia 11/11/2011  . Hypertension 11/11/2011  . Morbid obesity ([place]) 11/11/2011  . Tachycardia 08/04/2011  . Acute exacerbation of CHF (congestive heart failure) ([place]) 07/25/2011  . Diabetes mellitus with neuropathy 07/25/2011  . OSA (obstructive sleep apnea) 07/25/2011  . Acute bronchitis 07/25/2011  . Generalized weakness 07/25/2011  . Acute respiratory failure ([place]) 07/25/2011  . Restless leg syndrome 07/25/2011  . Anemia 07/25/2011   Ihor Austin, [place]; [place]  Aldona Lento 06/24/2015, 9:11 AM  [place], [place], [address] Phone: [phone]   Fax:  [phone]  Name: Mary [name] MRN: [id number] Date of Birth: 03/03/1946

## 2015-06-26 ENCOUNTER — Ambulatory Visit (HOSPITAL_COMMUNITY): Payer: PPO | Admitting: Physical Therapy

## 2015-06-26 DIAGNOSIS — S81801A Unspecified open wound, right lower leg, initial encounter: Secondary | ICD-10-CM

## 2015-06-26 DIAGNOSIS — T148 Other injury of unspecified body region: Secondary | ICD-10-CM | POA: Diagnosis not present

## 2015-06-26 DIAGNOSIS — S81001A Unspecified open wound, right knee, initial encounter: Secondary | ICD-10-CM

## 2015-06-26 DIAGNOSIS — S91001A Unspecified open wound, right ankle, initial encounter: Secondary | ICD-10-CM

## 2015-06-26 DIAGNOSIS — R29898 Other symptoms and signs involving the musculoskeletal system: Secondary | ICD-10-CM

## 2015-06-26 DIAGNOSIS — T148XXA Other injury of unspecified body region, initial encounter: Secondary | ICD-10-CM

## 2015-06-26 DIAGNOSIS — R600 Localized edema: Secondary | ICD-10-CM

## 2015-06-26 DIAGNOSIS — T798XXD Other early complications of trauma, subsequent encounter: Secondary | ICD-10-CM

## 2015-06-26 DIAGNOSIS — R296 Repeated falls: Secondary | ICD-10-CM

## 2015-06-26 DIAGNOSIS — T798XXS Other early complications of trauma, sequela: Secondary | ICD-10-CM

## 2015-06-26 DIAGNOSIS — R2689 Other abnormalities of gait and mobility: Secondary | ICD-10-CM

## 2015-06-26 NOTE — Therapy (Signed)
[place] [address] [place], [place], [address] Phone: [phone]   Fax:  [phone]  Physical Therapy Treatment  Patient Details  Name: Mary [name] MRN: [id number] Date of Birth: 1945-12-22 Referring Provider: [place] Crane  Encounter Date: 06/26/2015    Past Medical History  Diagnosis Date  . CHF (congestive heart failure)   . Diabetes mellitus   . Restless leg syndrome 07/25/2011  . Wound, open     right foot  . Diabetes mellitus with neuropathy 07/25/2011  . Cellulitis and abscess of foot 02/12/2012  . Osteomyelitis of right foot 02/14/2012  . Partial Achilles tendon tear 02/14/2012  . Dysrhythmia     tachy- takes Metoprolol  . OSA (obstructive sleep apnea) 07/25/2011    not using CPAP  . Arthritis     knees  . Anemia   . Complication of anesthesia     pt states after her hysterectomy the doctor said she was "wild" when she woke up     Past Surgical History  Procedure Laterality Date  . Abdominal hysterectomy    . Achilles tendon repair    . Toe amputation      partial rt great toe  . Below knee leg amputation Right 11/25/2012    Dr Sharol Given  . Amputation Right 11/25/2012    Procedure: AMPUTATION BELOW KNEE;  Surgeon: Newt Minion, MD;  Location: [facility];  Service: Orthopedics;  Laterality: Right;  Right Below Knee Amputation  . Breast surgery Left     Lumpectomy non ca  . Cataract extraction w/phaco Right 10/09/2013    Procedure: CATARACT EXTRACTION PHACO AND INTRAOCULAR LENS PLACEMENT (IOC);  Surgeon: Elta Guadeloupe T. Gershon Crane, MD;  [facility];  Service: Ophthalmology;  Laterality: Right;  CDE:9.05  . Cataract extraction w/phaco Left 10/23/2013    Procedure: ATTEMPTED CATARACT EXTRACTION PHACO AND INTRAOCULAR LENS PLACEMENT;  Surgeon: Elta Guadeloupe T. Gershon Crane, MD;  Location: [facility];  Service: Ophthalmology;  Laterality: Left;  CDE:  4.41    There were no vitals filed for this visit.  Visit Diagnosis:  Nonhealing nonsurgical  wound  Unstable balance  Falls frequently  Leg weakness, bilateral  Infected wound, sequela  Edema of foot  Open wound of knee, leg (except thigh), and ankle, complicated, right, initial encounter  Infected wound, subsequent encounter      Subjective Assessment - 06/26/15 0838    Subjective Pt states she tried to retrieve an item off the floor and slipped off the bed.  States it took her 2 hours to get up onto bed but usually can get back on it.  States she thinks it was because her Lt LE was weak.  States she has a hospitial bed ordered.    Currently in Pain? No/denies                       Wound Therapy - 06/26/15 0828    Decrease Necrotic Tissue to STG:   2 weeks 0   Increase Granulation Tissue to STG:  2 weeks 100   Decrease Length/Width/Depth by (cm) STG:  3 weeks:  decrease both wound sites by 50%;  LTG:  6 weeks:  Decrease both wound sites by 75 %    Additional Wound Therapy Goal PT to verbalize the importance of maintaining good blood sugars for optimal healing.           [facility] Adult PT Treatment/Exercise - 06/26/15 0830    Knee/Hip Exercises: Standing  Other Standing Knee Exercises Posture strengthening 10x each with RTB; scapular retraction, rows and shoulder extension    Knee/Hip Exercises: Supine   Single Leg Bridge Left;20 reps   Straight Leg Raises Strengthening;Both;15 reps   Knee/Hip Exercises: Sidelying   Hip ABduction Strengthening;Both;20 reps   Knee/Hip Exercises: Prone   Hamstring Curl 20 reps   Hip Extension Both;20 reps   Manual Therapy   Manual therapy comments Woundcare to anterior and posterior residual limb.  Continued maceration with continued use of bandaids.  Educated again on the importance of using appropriate dressings and keeping prosthesis off as much as possible.  Medipore tape appears to be the only dressing that will remain intact underneath socket.  Debrided border of anterior superior wound to remove callous. Used  xeroform dressings on wounds.    Tip of residual limb very raw and sore to the touch.  Padded this area well.  Posterior callous debrided and dressed with gauze and medipore tape.                    PT Short Term Goals - 06/13/15 1639    PT SHORT TERM GOAL #1   Title Pt wounds to be 100% granualted in order for healing to begin   Time 1   Period Weeks   PT SHORT TERM GOAL #2   Title Pt to be I in HEP to increase strength to improve safety    Time 2   Period Weeks           PT Long Term Goals - 06/13/15 1640    PT LONG TERM GOAL #1   Title PT to be Indpendent in advance  in HEP    Time 6   Period Weeks   PT LONG TERM GOAL #2   Title PT wounds to be 50% smaller    Time 6   Period Weeks   PT LONG TERM GOAL #3   Title PT family to be Independent in dressing wounds.   Time 6   Period Weeks   PT LONG TERM GOAL #4   Title Pt to be able to stand for 5 minutes without UE assist to allow pt to perform self grooming while standing with reduce risk of falling   Time 6   Period Weeks   PT LONG TERM GOAL #5   Title Pt to demonstrate improved power and strength by being able to come sit to stand one time without UE assist for reduced risk of falling    Time 6   Period Weeks               Plan - 06/26/15 0847    Clinical Impression Statement Continued maceration with continued use of bandaids.  Educated again on the importance of using appropriate dressings and keeping prosthesis off as much as possible.  Medipore tape appears to be the only dressing that will remain intact underneath socket.  Debrided border of anterior superior wound to remove callous.  Tip of residual limb very raw and sore to the touch.  Padded this area well.  Posterior callous debrided and dressed with gauze and medipore tape.  Reminded patient to return to prosthetic to adjust prosthesis so will not continue to rub.  continued with therex, increasing reps.  No new exercises added this session due  to time.  Standing actvities should be limited until wounds improve.    PT Next Visit Plan Continue with woundcare and progress therex as able.  Continue to focus on strengthening at his time and progress balance when wounds improve.         Problem List Patient Active Problem List   Diagnosis Date Noted  . Delirium 01/13/2014  . Diastolic CHF, acute on chronic (HCC) 01/13/2014  . Expressive aphasia 01/13/2014  . Acute encephalopathy 03/16/2013  . Acute on chronic renal failure ([place]) 03/16/2013  . Hypoglycemia 03/16/2013  . CHF (congestive heart failure) ([place])   . Unstable balance 01/16/2013  . Difficulty in walking(719.7) 01/16/2013  . Infected wound ([place]) 01/16/2013  . Hx of right BKA ([place]) 12/23/2012  . Candidiasis of breast 12/08/2012  . S/P bilateral BKA (below knee amputation) ([place]) 11/30/2012  . Diabetes ([place]) 11/30/2012  . Diabetic foot ulcer ([place]) 10/08/2012  . UTI (lower urinary tract infection) 10/07/2012  . Bacteremia due to Staphylococcus aureus 10/07/2012  . Anxiety 02/17/2012  . Osteomyelitis of right foot ([place]) 02/14/2012  . Hematuria, microscopic 02/13/2012  . Hyponatremia 02/13/2012  . PVD (peripheral vascular disease) ([place]) 02/08/2012  . Open wound of heel 02/08/2012  . Hyperlipidemia 11/11/2011  . Hypertension 11/11/2011  . Morbid obesity ([place]) 11/11/2011  . Tachycardia 08/04/2011  . Acute exacerbation of CHF (congestive heart failure) ([place]) 07/25/2011  . Diabetes mellitus with neuropathy 07/25/2011  . OSA (obstructive sleep apnea) 07/25/2011  . Acute bronchitis 07/25/2011  . Generalized weakness 07/25/2011  . Acute respiratory failure ([place]) 07/25/2011  . Restless leg syndrome 07/25/2011  . Anemia 07/25/2011    Teena Irani, PTA/CLT [phone]  06/26/2015, 8:48 AM  [place] [address] [place], [place], [address] Phone: [phone]   Fax:  [phone]  Name: Mary [name] MRN:  [id number] Date of Birth: 03/22/1946

## 2015-07-01 ENCOUNTER — Ambulatory Visit (HOSPITAL_COMMUNITY): Payer: PPO | Admitting: Physical Therapy

## 2015-07-03 ENCOUNTER — Ambulatory Visit (HOSPITAL_COMMUNITY): Payer: PPO | Attending: Internal Medicine | Admitting: Physical Therapy

## 2015-07-03 ENCOUNTER — Telehealth (HOSPITAL_COMMUNITY): Payer: Self-pay | Admitting: Physical Therapy

## 2015-07-03 DIAGNOSIS — R29898 Other symptoms and signs involving the musculoskeletal system: Secondary | ICD-10-CM | POA: Insufficient documentation

## 2015-07-03 DIAGNOSIS — S81801A Unspecified open wound, right lower leg, initial encounter: Secondary | ICD-10-CM | POA: Insufficient documentation

## 2015-07-03 DIAGNOSIS — R2689 Other abnormalities of gait and mobility: Secondary | ICD-10-CM | POA: Insufficient documentation

## 2015-07-03 DIAGNOSIS — S91001A Unspecified open wound, right ankle, initial encounter: Secondary | ICD-10-CM | POA: Insufficient documentation

## 2015-07-03 DIAGNOSIS — T798XXS Other early complications of trauma, sequela: Secondary | ICD-10-CM | POA: Insufficient documentation

## 2015-07-03 DIAGNOSIS — T798XXD Other early complications of trauma, subsequent encounter: Secondary | ICD-10-CM | POA: Insufficient documentation

## 2015-07-03 DIAGNOSIS — T148 Other injury of unspecified body region: Secondary | ICD-10-CM | POA: Insufficient documentation

## 2015-07-03 DIAGNOSIS — R600 Localized edema: Secondary | ICD-10-CM | POA: Insufficient documentation

## 2015-07-03 DIAGNOSIS — R296 Repeated falls: Secondary | ICD-10-CM | POA: Insufficient documentation

## 2015-07-03 DIAGNOSIS — S81001A Unspecified open wound, right knee, initial encounter: Secondary | ICD-10-CM | POA: Insufficient documentation

## 2015-07-03 NOTE — Telephone Encounter (Signed)
Pt did not show for her appointment. spoke with spouse who states patient had stomach issues this morning and was unable to make her appointment.  Reminded of next appointment.  Teena Irani, PTA/CLT [phone]

## 2015-07-07 ENCOUNTER — Ambulatory Visit (HOSPITAL_COMMUNITY): Payer: PPO | Admitting: Physical Therapy

## 2015-07-07 DIAGNOSIS — R2689 Other abnormalities of gait and mobility: Secondary | ICD-10-CM | POA: Diagnosis present

## 2015-07-07 DIAGNOSIS — R29898 Other symptoms and signs involving the musculoskeletal system: Secondary | ICD-10-CM | POA: Diagnosis present

## 2015-07-07 DIAGNOSIS — R600 Localized edema: Secondary | ICD-10-CM

## 2015-07-07 DIAGNOSIS — S81001A Unspecified open wound, right knee, initial encounter: Secondary | ICD-10-CM

## 2015-07-07 DIAGNOSIS — T798XXS Other early complications of trauma, sequela: Secondary | ICD-10-CM | POA: Diagnosis present

## 2015-07-07 DIAGNOSIS — T148 Other injury of unspecified body region: Secondary | ICD-10-CM | POA: Diagnosis not present

## 2015-07-07 DIAGNOSIS — S81801A Unspecified open wound, right lower leg, initial encounter: Secondary | ICD-10-CM | POA: Diagnosis present

## 2015-07-07 DIAGNOSIS — T148XXA Other injury of unspecified body region, initial encounter: Secondary | ICD-10-CM

## 2015-07-07 DIAGNOSIS — R296 Repeated falls: Secondary | ICD-10-CM | POA: Diagnosis present

## 2015-07-07 DIAGNOSIS — S91001A Unspecified open wound, right ankle, initial encounter: Secondary | ICD-10-CM | POA: Diagnosis present

## 2015-07-07 DIAGNOSIS — T798XXD Other early complications of trauma, subsequent encounter: Secondary | ICD-10-CM | POA: Diagnosis present

## 2015-07-07 NOTE — Therapy (Signed)
[place], [place], [address] Phone: [phone]   Fax:  [phone]  Physical Therapy Treatment  Patient Details  Name: Mary [name] MRN: [id number] Date of Birth: February 24, 1946 Referring Provider: Nevada Crane  Encounter Date: 07/07/2015      PT End of Session - 07/07/15 1301    Visit Number 5   Number of Visits 18   Authorization Type A[employer] Medicare   Authorization - Visit Number 5   Authorization - Number of Visits 10   PT Start Time 0800   PT Stop Time [id number]   PT Time Calculation (min) 52 min   Activity Tolerance Patient tolerated treatment well   Behavior During Therapy [facility] for tasks assessed/performed      Past Medical History  Diagnosis Date  . CHF (congestive heart failure)   . Diabetes mellitus   . Restless leg syndrome 07/25/2011  . Wound, open     right foot  . Diabetes mellitus with neuropathy 07/25/2011  . Cellulitis and abscess of foot 02/12/2012  . Osteomyelitis of right foot 02/14/2012  . Partial Achilles tendon tear 02/14/2012  . Dysrhythmia     tachy- takes Metoprolol  . OSA (obstructive sleep apnea) 07/25/2011    not using CPAP  . Arthritis     knees  . Anemia   . Complication of anesthesia     pt states after her hysterectomy the doctor said she was "wild" when she woke up     Past Surgical History  Procedure Laterality Date  . Abdominal hysterectomy    . Achilles tendon repair    . Toe amputation      partial rt great toe  . Below knee leg amputation Right 11/25/2012    Dr Sharol Given  . Amputation Right 11/25/2012    Procedure: AMPUTATION BELOW KNEE;  Surgeon: Newt Minion, MD;  Location: [place];  Service: Orthopedics;  Laterality: Right;  Right Below Knee Amputation  . Breast surgery Left     Lumpectomy non ca  . Cataract extraction w/phaco Right 10/09/2013    Procedure: CATARACT EXTRACTION PHACO AND INTRAOCULAR LENS PLACEMENT (IOC);  Surgeon: Elta Guadeloupe T. Gershon Crane, MD;  Location: [facility];   Service: Ophthalmology;  Laterality: Right;  CDE:9.05  . Cataract extraction w/phaco Left 10/23/2013    Procedure: ATTEMPTED CATARACT EXTRACTION PHACO AND INTRAOCULAR LENS PLACEMENT;  Surgeon: Elta Guadeloupe T. Gershon Crane, MD;  Location: [facility];  Service: Ophthalmology;  Laterality: Left;  CDE:  4.41    There were no vitals filed for this visit.  Visit Diagnosis:  Nonhealing nonsurgical wound  Unstable balance  Falls frequently  Leg weakness, bilateral  Infected wound, sequela  Edema of foot  Open wound of knee, leg (except thigh), and ankle, complicated, right, initial encounter  Infected wound, subsequent encounter      Subjective Assessment - 07/07/15 1258    Subjective Pt states she got her new hospital bed and the mattress is really slippery . STates it is comfortable and she has been sleeping better since she got it.  Pt reports she has been keeping her prosthesis off as much as possible.                          [facility] Adult PT Treatment/Exercise - 07/07/15 0828    Knee/Hip Exercises: Supine   Single Leg Bridge Left;20 reps   Straight Leg Raises Strengthening;Both;15 reps   Other Supine Knee/Hip Exercises pilates 100,(pt completed  all 100   Other Supine Knee/Hip Exercises chest press with 5# bar 15 reps, horiz abd 3# bar 10 reps each (with abdominal stab)   Knee/Hip Exercises: Sidelying   Hip ABduction Strengthening;Both;20 reps   Knee/Hip Exercises: Prone   Hamstring Curl 20 reps   Hip Extension Both;20 reps   Manual Therapy   Manual therapy comments Debrided large amout of callous and dry tissue from end of residual limb and anterior surface.  Cleansed area well before application of xeroform and medipore tape.                    PT Short Term Goals - 06/13/15 1639    PT SHORT TERM GOAL #1   Title Pt wounds to be 100% granualted in order for healing to begin   Time 1   Period Weeks   PT SHORT TERM GOAL #2   Title Pt to be I in HEP to increase  strength to improve safety    Time 2   Period Weeks           PT Long Term Goals - 06/13/15 1640    PT LONG TERM GOAL #1   Title PT to be Indpendent in advance  in HEP    Time 6   Period Weeks   PT LONG TERM GOAL #2   Title PT wounds to be 50% smaller    Time 6   Period Weeks   PT LONG TERM GOAL #3   Title PT family to be Independent in dressing wounds.   Time 6   Period Weeks   PT LONG TERM GOAL #4   Title Pt to be able to stand for 5 minutes without UE assist to allow pt to perform self grooming while standing with reduce risk of falling   Time 6   Period Weeks   PT LONG TERM GOAL #5   Title Pt to demonstrate improved power and strength by being able to come sit to stand one time without UE assist for reduced risk of falling    Time 6   Period Weeks               Plan - 07/07/15 1301    Clinical Impression Statement continued encouragement to remove prosthesis/socket as much as possible to decrease moisture in wound bed and increase healing.  PT reported compliance with HEP.  Continued with established therex.    PT Next Visit Plan Continue with woundcare and progress therex as able.  Continue to focus on strengthening at his time and progress balance when wounds improve. Add weight and increase difficulty of therex next session.        Problem List Patient Active Problem List   Diagnosis Date Noted  . Delirium 01/13/2014  . Diastolic CHF, acute on chronic (HCC) 01/13/2014  . Expressive aphasia 01/13/2014  . Acute encephalopathy 03/16/2013  . Acute on chronic renal failure ([place]) 03/16/2013  . Hypoglycemia 03/16/2013  . CHF (congestive heart failure) ([place])   . Unstable balance 01/16/2013  . Difficulty in walking(719.7) 01/16/2013  . Infected wound ([place]) 01/16/2013  . Hx of right BKA ([place]) 12/23/2012  . Candidiasis of breast 12/08/2012  . S/P bilateral BKA (below knee amputation) ([place]) 11/30/2012  . Diabetes ([place]) 11/30/2012  . Diabetic foot ulcer  ([place]) 10/08/2012  . UTI (lower urinary tract infection) 10/07/2012  . Bacteremia due to Staphylococcus aureus 10/07/2012  . Anxiety 02/17/2012  . Osteomyelitis of right foot ([place]) 02/14/2012  .  Hematuria, microscopic 02/13/2012  . Hyponatremia 02/13/2012  . PVD (peripheral vascular disease) ([place]) 02/08/2012  . Open wound of heel 02/08/2012  . Hyperlipidemia 11/11/2011  . Hypertension 11/11/2011  . Morbid obesity ([place]) 11/11/2011  . Tachycardia 08/04/2011  . Acute exacerbation of CHF (congestive heart failure) ([place]) 07/25/2011  . Diabetes mellitus with neuropathy 07/25/2011  . OSA (obstructive sleep apnea) 07/25/2011  . Acute bronchitis 07/25/2011  . Generalized weakness 07/25/2011  . Acute respiratory failure ([place]) 07/25/2011  . Restless leg syndrome 07/25/2011  . Anemia 07/25/2011    Teena Irani, PTA/CLT [phone]  07/07/2015, 1:04 PM  [facility] [address] [place], [place], [address] Phone: [phone]   Fax:  [phone]  Name: GWENDALYNN LELLO MRN: [id number] Date of Birth: [birth year]

## 2015-07-09 ENCOUNTER — Ambulatory Visit (HOSPITAL_COMMUNITY): Payer: PPO | Admitting: Physical Therapy

## 2015-07-11 ENCOUNTER — Ambulatory Visit (HOSPITAL_COMMUNITY): Payer: Self-pay | Admitting: Physical Therapy

## 2015-07-14 ENCOUNTER — Ambulatory Visit (HOSPITAL_COMMUNITY): Payer: PPO | Admitting: Physical Therapy

## 2015-07-14 DIAGNOSIS — R600 Localized edema: Secondary | ICD-10-CM

## 2015-07-14 DIAGNOSIS — T798XXS Other early complications of trauma, sequela: Secondary | ICD-10-CM

## 2015-07-14 DIAGNOSIS — S81801A Unspecified open wound, right lower leg, initial encounter: Secondary | ICD-10-CM

## 2015-07-14 DIAGNOSIS — R296 Repeated falls: Secondary | ICD-10-CM

## 2015-07-14 DIAGNOSIS — R2689 Other abnormalities of gait and mobility: Secondary | ICD-10-CM

## 2015-07-14 DIAGNOSIS — S91001A Unspecified open wound, right ankle, initial encounter: Secondary | ICD-10-CM

## 2015-07-14 DIAGNOSIS — T798XXD Other early complications of trauma, subsequent encounter: Secondary | ICD-10-CM

## 2015-07-14 DIAGNOSIS — S81001A Unspecified open wound, right knee, initial encounter: Secondary | ICD-10-CM

## 2015-07-14 DIAGNOSIS — T148 Other injury of unspecified body region: Secondary | ICD-10-CM | POA: Diagnosis not present

## 2015-07-14 DIAGNOSIS — T148XXA Other injury of unspecified body region, initial encounter: Secondary | ICD-10-CM

## 2015-07-14 DIAGNOSIS — R29898 Other symptoms and signs involving the musculoskeletal system: Secondary | ICD-10-CM

## 2015-07-14 NOTE — Therapy (Signed)
[place], [place], [address] Phone: [phone]   Fax:  [phone]  Physical Therapy Treatment  Patient Details  Name: CORETHA MELERINE MRN: [id number] Date of Birth: 11/28/1945 Referring Provider: Nevada Crane  Encounter Date: 07/14/2015      PT End of Session - 07/14/15 0859    Visit Number 6   Number of Visits 18   Authorization Type Aetna Medicare   Authorization - Visit Number 6   Authorization - Number of Visits 10   PT Start Time 0804   PT Stop Time 0852   PT Time Calculation (min) 48 min   Activity Tolerance Patient tolerated treatment well   Behavior During Therapy [facility] for tasks assessed/performed      Past Medical History  Diagnosis Date  . CHF (congestive heart failure)   . Diabetes mellitus   . Restless leg syndrome 07/25/2011  . Wound, open     right foot  . Diabetes mellitus with neuropathy 07/25/2011  . Cellulitis and abscess of foot 02/12/2012  . Osteomyelitis of right foot 02/14/2012  . Partial Achilles tendon tear 02/14/2012  . Dysrhythmia     tachy- takes Metoprolol  . OSA (obstructive sleep apnea) 07/25/2011    not using CPAP  . Arthritis     knees  . Anemia   . Complication of anesthesia     pt states after her hysterectomy the doctor said she was "wild" when she woke up     Past Surgical History  Procedure Laterality Date  . Abdominal hysterectomy    . Achilles tendon repair    . Toe amputation      partial rt great toe  . Below knee leg amputation Right 11/25/2012    Dr Sharol Given  . Amputation Right 11/25/2012    Procedure: AMPUTATION BELOW KNEE;  Surgeon: Newt Minion, MD;  Location: [place];  Service: Orthopedics;  Laterality: Right;  Right Below Knee Amputation  . Breast surgery Left     Lumpectomy non ca  . Cataract extraction w/phaco Right 10/09/2013    Procedure: CATARACT EXTRACTION PHACO AND INTRAOCULAR LENS PLACEMENT (IOC);  Surgeon: Elta Guadeloupe T. Gershon Crane, MD;  Location[facility];   Service: Ophthalmology;  Laterality: Right;  CDE:9.05  . Cataract extraction w/phaco Left 10/23/2013    Procedure: ATTEMPTED CATARACT EXTRACTION PHACO AND INTRAOCULAR LENS PLACEMENT;  Surgeon: Elta Guadeloupe T. Gershon Crane, MD;  Location: [facility];  Service: Ophthalmology;  Laterality: Left;  CDE:  4.41    There were no vitals filed for this visit.  Visit Diagnosis:  Nonhealing nonsurgical wound  Unstable balance  Falls frequently  Leg weakness, bilateral  Infected wound, sequela  Edema of foot  Open wound of knee, leg (except thigh), and ankle, complicated, right, initial encounter  Infected wound, subsequent encounter      Subjective Assessment - 07/14/15 0852    Subjective Pt states her restless leg has been bothering her and that's why she has missed her appointments. Pt states she is currently not hurting but she has been on her LE's alot lately due to her restless leg.                          [facility] Adult PT Treatment/Exercise - 07/14/15 0854    Knee/Hip Exercises: Standing   Hip Abduction Both;10 reps   Hip Extension Both;10 reps   Other Standing Knee Exercises marching with bilateral UE assist 10 reps   Other  Standing Knee Exercises Posture strengthening 10x each with GTB; scapular retraction, rows and shoulder extension    Manual Therapy   Manual therapy comments Debrided large amout of callous and dry tissue from end of residual limb and anterior surface.  Cleansed area well before application of silver hydrofiber and medipore tape.                    PT Short Term Goals - 06/13/15 1639    PT SHORT TERM GOAL #1   Title Pt wounds to be 100% granualted in order for healing to begin   Time 1   Period Weeks   PT SHORT TERM GOAL #2   Title Pt to be I in HEP to increase strength to improve safety    Time 2   Period Weeks           PT Long Term Goals - 06/13/15 1640    PT LONG TERM GOAL #1   Title PT to be Indpendent in advance  in HEP    Time 6    Period Weeks   PT LONG TERM GOAL #2   Title PT wounds to be 50% smaller    Time 6   Period Weeks   PT LONG TERM GOAL #3   Title PT family to be Independent in dressing wounds.   Time 6   Period Weeks   PT LONG TERM GOAL #4   Title Pt to be able to stand for 5 minutes without UE assist to allow pt to perform self grooming while standing with reduce risk of falling   Time 6   Period Weeks   PT LONG TERM GOAL #5   Title Pt to demonstrate improved power and strength by being able to come sit to stand one time without UE assist for reduced risk of falling    Time 6   Period Weeks               Plan - 07/14/15 0900    Clinical Impression Statement Wounds overal improved with increased approximation of anterior wound and less calloused area on tip of residual limb.  Debrided remaining callous from tip of LE and calloused area around anterior wound.  Continued dressing wtih silver hydrofiber as LE sweats and this helps to wick the moisture away.  Progressed to standing exercises due to improvement in wounds.  Pt able to complete with short standing rest breaks between.     PT Next Visit Plan Continue with woundcare and progress therex as able.  Continue to focus on strengthening at his time and progress balance.          Problem List Patient Active Problem List   Diagnosis Date Noted  . Delirium 01/13/2014  . Diastolic CHF, acute on chronic (HCC) 01/13/2014  . Expressive aphasia 01/13/2014  . Acute encephalopathy 03/16/2013  . Acute on chronic renal failure ([place]) 03/16/2013  . Hypoglycemia 03/16/2013  . CHF (congestive heart failure) ([place])   . Unstable balance 01/16/2013  . Difficulty in walking(719.7) 01/16/2013  . Infected wound ([place]) 01/16/2013  . Hx of right BKA ([place]) 12/23/2012  . Candidiasis of breast 12/08/2012  . S/P bilateral BKA (below knee amputation) ([place]) 11/30/2012  . Diabetes ([place]) 11/30/2012  . Diabetic foot ulcer ([place]) 10/08/2012  . UTI (lower urinary  tract infection) 10/07/2012  . Bacteremia due to Staphylococcus aureus 10/07/2012  . Anxiety 02/17/2012  . Osteomyelitis of right foot ([place]) 02/14/2012  . Hematuria, microscopic 02/13/2012  .  Hyponatremia 02/13/2012  . PVD (peripheral vascular disease) ([place]) 02/08/2012  . Open wound of heel 02/08/2012  . Hyperlipidemia 11/11/2011  . Hypertension 11/11/2011  . Morbid obesity ([place]) 11/11/2011  . Tachycardia 08/04/2011  . Acute exacerbation of CHF (congestive heart failure) ([place]) 07/25/2011  . Diabetes mellitus with neuropathy 07/25/2011  . OSA (obstructive sleep apnea) 07/25/2011  . Acute bronchitis 07/25/2011  . Generalized weakness 07/25/2011  . Acute respiratory failure ([place]) 07/25/2011  . Restless leg syndrome 07/25/2011  . Anemia 07/25/2011    Teena Irani, PTA/CLT [phone]  07/14/2015, 9:06 AM  [place], [place], [address] Phone: [phone]   Fax:  [phone]  Name: ANALESE LAMAY MRN: [id number] Date of Birth: September 24, 1945

## 2015-07-16 ENCOUNTER — Ambulatory Visit (HOSPITAL_COMMUNITY): Payer: PPO | Admitting: Physical Therapy

## 2015-07-18 ENCOUNTER — Ambulatory Visit (HOSPITAL_COMMUNITY): Payer: PPO | Admitting: Physical Therapy

## 2015-07-19 ENCOUNTER — Observation Stay (HOSPITAL_COMMUNITY)
Admission: EM | Admit: 2015-07-19 | Discharge: 2015-07-22 | Disposition: A | Discharge status: Home / Self Care | Payer: PPO | Attending: Internal Medicine | Admitting: Internal Medicine

## 2015-07-19 ENCOUNTER — Emergency Department (HOSPITAL_COMMUNITY): Payer: PPO

## 2015-07-19 ENCOUNTER — Encounter (HOSPITAL_COMMUNITY): Payer: Self-pay

## 2015-07-19 DIAGNOSIS — M1990 Unspecified osteoarthritis, unspecified site: Secondary | ICD-10-CM | POA: Insufficient documentation

## 2015-07-19 DIAGNOSIS — E1140 Type 2 diabetes mellitus with diabetic neuropathy, unspecified: Secondary | ICD-10-CM | POA: Diagnosis not present

## 2015-07-19 DIAGNOSIS — I5033 Acute on chronic diastolic (congestive) heart failure: Secondary | ICD-10-CM | POA: Diagnosis not present

## 2015-07-19 DIAGNOSIS — I5043 Acute on chronic combined systolic (congestive) and diastolic (congestive) heart failure: Secondary | ICD-10-CM | POA: Diagnosis not present

## 2015-07-19 DIAGNOSIS — G4733 Obstructive sleep apnea (adult) (pediatric): Secondary | ICD-10-CM | POA: Diagnosis not present

## 2015-07-19 DIAGNOSIS — Z794 Long term (current) use of insulin: Secondary | ICD-10-CM | POA: Diagnosis not present

## 2015-07-19 DIAGNOSIS — R7989 Other specified abnormal findings of blood chemistry: Secondary | ICD-10-CM | POA: Diagnosis not present

## 2015-07-19 DIAGNOSIS — M869 Osteomyelitis, unspecified: Secondary | ICD-10-CM | POA: Insufficient documentation

## 2015-07-19 DIAGNOSIS — I499 Cardiac arrhythmia, unspecified: Secondary | ICD-10-CM | POA: Diagnosis not present

## 2015-07-19 DIAGNOSIS — E119 Type 2 diabetes mellitus without complications: Secondary | ICD-10-CM | POA: Diagnosis not present

## 2015-07-19 DIAGNOSIS — N181 Chronic kidney disease, stage 1: Secondary | ICD-10-CM

## 2015-07-19 DIAGNOSIS — Z872 Personal history of diseases of the skin and subcutaneous tissue: Secondary | ICD-10-CM | POA: Insufficient documentation

## 2015-07-19 DIAGNOSIS — D649 Anemia, unspecified: Secondary | ICD-10-CM | POA: Insufficient documentation

## 2015-07-19 DIAGNOSIS — R0600 Dyspnea, unspecified: Secondary | ICD-10-CM | POA: Insufficient documentation

## 2015-07-19 DIAGNOSIS — Z23 Encounter for immunization: Secondary | ICD-10-CM | POA: Insufficient documentation

## 2015-07-19 DIAGNOSIS — R778 Other specified abnormalities of plasma proteins: Secondary | ICD-10-CM

## 2015-07-19 DIAGNOSIS — Z79899 Other long term (current) drug therapy: Secondary | ICD-10-CM | POA: Diagnosis not present

## 2015-07-19 DIAGNOSIS — D631 Anemia in chronic kidney disease: Secondary | ICD-10-CM | POA: Insufficient documentation

## 2015-07-19 DIAGNOSIS — G2581 Restless legs syndrome: Secondary | ICD-10-CM | POA: Insufficient documentation

## 2015-07-19 DIAGNOSIS — R600 Localized edema: Secondary | ICD-10-CM | POA: Insufficient documentation

## 2015-07-19 DIAGNOSIS — R0989 Other specified symptoms and signs involving the circulatory and respiratory systems: Secondary | ICD-10-CM | POA: Insufficient documentation

## 2015-07-19 DIAGNOSIS — J9601 Acute respiratory failure with hypoxia: Secondary | ICD-10-CM | POA: Diagnosis not present

## 2015-07-19 DIAGNOSIS — E876 Hypokalemia: Secondary | ICD-10-CM | POA: Diagnosis not present

## 2015-07-19 DIAGNOSIS — J9600 Acute respiratory failure, unspecified whether with hypoxia or hypercapnia: Secondary | ICD-10-CM | POA: Diagnosis present

## 2015-07-19 DIAGNOSIS — R0602 Shortness of breath: Secondary | ICD-10-CM | POA: Diagnosis present

## 2015-07-19 LAB — CBC
HCT: 29.7 % — ABNORMAL LOW (ref 36.0–46.0)
Hemoglobin: 9.4 g/dL — ABNORMAL LOW (ref 12.0–15.0)
MCH: 26.7 pg (ref 26.0–34.0)
MCHC: 31.6 g/dL (ref 30.0–36.0)
MCV: 84.4 fL (ref 78.0–100.0)
PLATELETS: 213 10*3/uL (ref 150–400)
RBC: 3.52 MIL/uL — AB (ref 3.87–5.11)
RDW: 16.5 % — AB (ref 11.5–15.5)
WBC: 7.2 10*3/uL (ref 4.0–10.5)

## 2015-07-19 LAB — URINALYSIS, ROUTINE W REFLEX MICROSCOPIC
BILIRUBIN URINE: NEGATIVE
GLUCOSE, UA: NEGATIVE mg/dL
KETONES UR: NEGATIVE mg/dL
LEUKOCYTES UA: NEGATIVE
Nitrite: NEGATIVE
Protein, ur: >300 mg/dL — AB
Specific Gravity, Urine: 1.02 (ref 1.005–1.030)
pH: 7 (ref 5.0–8.0)

## 2015-07-19 LAB — COMPREHENSIVE METABOLIC PANEL
ALT: 14 U/L (ref 14–54)
AST: 20 U/L (ref 15–41)
Albumin: 3 g/dL — ABNORMAL LOW (ref 3.5–5.0)
Alkaline Phosphatase: 64 U/L (ref 38–126)
Anion gap: 8 (ref 5–15)
BUN: 23 mg/dL — ABNORMAL HIGH (ref 6–20)
CHLORIDE: 102 mmol/L (ref 101–111)
CO2: 28 mmol/L (ref 22–32)
CREATININE: 1.35 mg/dL — AB (ref 0.44–1.00)
Calcium: 7.9 mg/dL — ABNORMAL LOW (ref 8.9–10.3)
GFR calc Af Amer: 45 mL/min — ABNORMAL LOW (ref >60)
GFR calc non Af Amer: 39 mL/min — ABNORMAL LOW (ref >60)
Glucose, Bld: 137 mg/dL — ABNORMAL HIGH (ref 65–99)
Potassium: 3.3 mmol/L — ABNORMAL LOW (ref 3.5–5.1)
Sodium: 138 mmol/L (ref 135–145)
TOTAL PROTEIN: 6.4 g/dL — AB (ref 6.5–8.1)
Total Bilirubin: 0.7 mg/dL (ref 0.3–1.2)

## 2015-07-19 LAB — URINE MICROSCOPIC-ADD ON

## 2015-07-19 LAB — CBG MONITORING, ED: Glucose-Capillary: 256 mg/dL — ABNORMAL HIGH (ref 65–99)

## 2015-07-19 LAB — I-STAT TROPONIN, ED: Troponin i, poc: 0.03 ng/mL (ref 0.00–0.08)

## 2015-07-19 LAB — BRAIN NATRIURETIC PEPTIDE: B NATRIURETIC PEPTIDE 5: 1612 pg/mL — AB (ref 0.0–100.0)

## 2015-07-19 LAB — TROPONIN I: Troponin I: 0.06 ng/mL — ABNORMAL HIGH (ref <0.031)

## 2015-07-19 MED ORDER — ROPINIROLE HCL 1 MG PO TABS
2.0000 mg | ORAL_TABLET | Freq: Every evening | ORAL | Status: DC
  Administered 2015-07-19 – 2015-07-21 (×3): 2 mg via ORAL
  Filled 2015-07-19 (×4): qty 2

## 2015-07-19 MED ORDER — SODIUM CHLORIDE 0.9 % IV SOLN: INTRAVENOUS | Status: DC

## 2015-07-19 MED ORDER — TRAMADOL HCL 50 MG PO TABS
50.0000 mg | ORAL_TABLET | Freq: Four times a day (QID) | ORAL | Status: DC | PRN
  Administered 2015-07-19 – 2015-07-21 (×6): 50 mg via ORAL
  Filled 2015-07-19 (×5): qty 1

## 2015-07-19 MED ORDER — ONDANSETRON HCL 4 MG/2ML IJ SOLN: 4.0000 mg | Freq: Four times a day (QID) | INTRAMUSCULAR | Status: DC | PRN

## 2015-07-19 MED ORDER — INSULIN ASPART 100 UNIT/ML ~~LOC~~ SOLN
0.0000 [IU] | Freq: Every day | SUBCUTANEOUS | Status: DC
  Administered 2015-07-19: 3 [IU] via SUBCUTANEOUS
  Filled 2015-07-19: qty 1

## 2015-07-19 MED ORDER — POTASSIUM CHLORIDE CRYS ER 20 MEQ PO TBCR
40.0000 meq | EXTENDED_RELEASE_TABLET | Freq: Once | ORAL | Status: AC
  Administered 2015-07-19: 40 meq via ORAL
  Filled 2015-07-19: qty 2

## 2015-07-19 MED ORDER — FUROSEMIDE 10 MG/ML IJ SOLN
40.0000 mg | Freq: Once | INTRAMUSCULAR | Status: AC
Start: 2015-07-19 — End: 2015-07-19
  Administered 2015-07-19: 40 mg via INTRAVENOUS
  Filled 2015-07-19: qty 4

## 2015-07-19 MED ORDER — LISINOPRIL-HYDROCHLOROTHIAZIDE 20-12.5 MG PO TABS: 1.0000 | ORAL_TABLET | Freq: Every day | ORAL | Status: DC

## 2015-07-19 MED ORDER — DULOXETINE HCL 60 MG PO CPEP
60.0000 mg | ORAL_CAPSULE | Freq: Every evening | ORAL | Status: DC | PRN
  Administered 2015-07-20: 60 mg via ORAL
  Filled 2015-07-19: qty 1

## 2015-07-19 MED ORDER — SPIRONOLACTONE 25 MG PO TABS
ORAL_TABLET | ORAL | Status: AC
  Filled 2015-07-19: qty 1

## 2015-07-19 MED ORDER — SODIUM CHLORIDE 0.9% FLUSH
3.0000 mL | Freq: Two times a day (BID) | INTRAVENOUS | Status: DC
  Administered 2015-07-20 – 2015-07-21 (×4): 3 mL via INTRAVENOUS

## 2015-07-19 MED ORDER — GABAPENTIN 300 MG PO CAPS
ORAL_CAPSULE | ORAL | Status: AC
  Filled 2015-07-19: qty 2

## 2015-07-19 MED ORDER — INSULIN ASPART 100 UNIT/ML ~~LOC~~ SOLN
8.0000 [IU] | Freq: Three times a day (TID) | SUBCUTANEOUS | Status: DC
  Administered 2015-07-20: 8 [IU] via SUBCUTANEOUS
  Filled 2015-07-19: qty 1

## 2015-07-19 MED ORDER — METOPROLOL TARTRATE 25 MG PO TABS
25.0000 mg | ORAL_TABLET | Freq: Two times a day (BID) | ORAL | Status: DC
  Administered 2015-07-19 – 2015-07-22 (×6): 25 mg via ORAL
  Filled 2015-07-19 (×6): qty 1

## 2015-07-19 MED ORDER — INSULIN ASPART 100 UNIT/ML ~~LOC~~ SOLN
0.0000 [IU] | Freq: Three times a day (TID) | SUBCUTANEOUS | Status: DC
  Administered 2015-07-21: 7 [IU] via SUBCUTANEOUS

## 2015-07-19 MED ORDER — GABAPENTIN 300 MG PO CAPS
600.0000 mg | ORAL_CAPSULE | Freq: Three times a day (TID) | ORAL | Status: DC
  Administered 2015-07-19 – 2015-07-22 (×8): 600 mg via ORAL
  Filled 2015-07-19 (×7): qty 2

## 2015-07-19 MED ORDER — HYDROCHLOROTHIAZIDE 12.5 MG PO CAPS
12.5000 mg | ORAL_CAPSULE | Freq: Every day | ORAL | Status: DC
  Administered 2015-07-20 – 2015-07-22 (×3): 12.5 mg via ORAL
  Filled 2015-07-19 (×6): qty 1

## 2015-07-19 MED ORDER — ENOXAPARIN SODIUM 40 MG/0.4ML ~~LOC~~ SOLN
40.0000 mg | SUBCUTANEOUS | Status: DC
Start: 2015-07-19 — End: 2015-07-22
  Administered 2015-07-19 – 2015-07-21 (×3): 40 mg via SUBCUTANEOUS
  Filled 2015-07-19 (×3): qty 0.4

## 2015-07-19 MED ORDER — PRAVASTATIN SODIUM 40 MG PO TABS
40.0000 mg | ORAL_TABLET | Freq: Every day | ORAL | Status: DC
  Administered 2015-07-20 – 2015-07-21 (×2): 40 mg via ORAL
  Filled 2015-07-19 (×4): qty 1

## 2015-07-19 MED ORDER — SODIUM CHLORIDE 0.9 % IV SOLN: 250.0000 mL | INTRAVENOUS | Status: DC | PRN

## 2015-07-19 MED ORDER — CYCLOBENZAPRINE HCL 10 MG PO TABS
5.0000 mg | ORAL_TABLET | Freq: Two times a day (BID) | ORAL | Status: DC | PRN
  Administered 2015-07-20 – 2015-07-21 (×2): 5 mg via ORAL
  Filled 2015-07-19 (×2): qty 1

## 2015-07-19 MED ORDER — CILOSTAZOL 100 MG PO TABS
ORAL_TABLET | ORAL | Status: AC
  Filled 2015-07-19: qty 1

## 2015-07-19 MED ORDER — SPIRONOLACTONE 25 MG PO TABS
25.0000 mg | ORAL_TABLET | Freq: Every day | ORAL | Status: DC
  Administered 2015-07-19 – 2015-07-22 (×4): 25 mg via ORAL
  Filled 2015-07-19 (×6): qty 1

## 2015-07-19 MED ORDER — CILOSTAZOL 100 MG PO TABS
100.0000 mg | ORAL_TABLET | Freq: Two times a day (BID) | ORAL | Status: DC
  Administered 2015-07-19 – 2015-07-22 (×6): 100 mg via ORAL
  Filled 2015-07-19 (×9): qty 1

## 2015-07-19 MED ORDER — LISINOPRIL 10 MG PO TABS
20.0000 mg | ORAL_TABLET | Freq: Every day | ORAL | Status: DC
  Administered 2015-07-20: 20 mg via ORAL
  Filled 2015-07-19: qty 2

## 2015-07-19 MED ORDER — ACETAMINOPHEN 325 MG PO TABS: 650.0000 mg | ORAL_TABLET | ORAL | Status: DC | PRN

## 2015-07-19 MED ORDER — FUROSEMIDE 10 MG/ML IJ SOLN
40.0000 mg | Freq: Two times a day (BID) | INTRAMUSCULAR | Status: DC
  Administered 2015-07-19 – 2015-07-20 (×2): 40 mg via INTRAVENOUS
  Filled 2015-07-19 (×2): qty 4

## 2015-07-19 MED ORDER — INSULIN DETEMIR 100 UNIT/ML ~~LOC~~ SOLN
35.0000 [IU] | Freq: Two times a day (BID) | SUBCUTANEOUS | Status: DC
  Administered 2015-07-19 – 2015-07-20 (×3): 35 [IU] via SUBCUTANEOUS
  Filled 2015-07-19 (×4): qty 0.35

## 2015-07-19 MED ORDER — ROPINIROLE HCL 1 MG PO TABS
ORAL_TABLET | ORAL | Status: AC
  Filled 2015-07-19: qty 2

## 2015-07-19 MED ORDER — SODIUM CHLORIDE 0.9% FLUSH: 3.0000 mL | INTRAVENOUS | Status: DC | PRN

## 2015-07-19 MED ORDER — POTASSIUM CHLORIDE CRYS ER 20 MEQ PO TBCR
40.0000 meq | EXTENDED_RELEASE_TABLET | Freq: Two times a day (BID) | ORAL | Status: DC
Start: 2015-07-19 — End: 2015-07-22
  Administered 2015-07-19 – 2015-07-22 (×6): 40 meq via ORAL
  Filled 2015-07-19 (×6): qty 2

## 2015-07-19 NOTE — ED Provider Notes (Signed)
CSN: [id number]     Arrival date & time 07/19/15  1157 History   First MD Initiated Contact with Patient 07/19/15 1316     Chief Complaint  Patient presents with  . Shortness of Breath     (Consider location/radiation/quality/duration/timing/severity/associated sxs/prior Treatment) The history is provided by the patient.  Patient with hx chf, presents w dyspnea for the past few weeks.  pcp had placed on lasix for 5 days a couple weeks ago with transient improvement in symptoms, but now feels worse. Dyspnea at rest, worse w exertion, worse w laying flat.  +orthopnea. No pnd. Denies wt increased or increased edema. Urinating normal amount. Denies any other recent change in meds. Chest feels tight at times but no episodic or exertional chest pain. No cough or uri c/o. No fever or chills.  Saw pcp today who sent to ED for admission.     Past Medical History  Diagnosis Date  . CHF (congestive heart failure) ([place])   . Diabetes mellitus   . Restless leg syndrome 07/25/2011  . Wound, open     right foot  . Diabetes mellitus with neuropathy 07/25/2011  . Cellulitis and abscess of foot 02/12/2012  . Osteomyelitis of right foot ([place]) 02/14/2012  . Partial Achilles tendon tear 02/14/2012  . Dysrhythmia     tachy- takes Metoprolol  . OSA (obstructive sleep apnea) 07/25/2011    not using CPAP  . Arthritis     knees  . Anemia   . Complication of anesthesia     pt states after her hysterectomy the doctor said she was "wild" when she woke up    Past Surgical History  Procedure Laterality Date  . Abdominal hysterectomy    . Achilles tendon repair    . Toe amputation      partial rt great toe  . Below knee leg amputation Right 11/25/2012    Dr Sharol Given  . Amputation Right 11/25/2012    Procedure: AMPUTATION BELOW KNEE;  Surgeon: Newt Minion, MD;  Location: [place];  Service: Orthopedics;  Laterality: Right;  Right Below Knee Amputation  . Breast surgery Left     Lumpectomy non ca  . Cataract  extraction w/phaco Right 10/09/2013    Procedure: CATARACT EXTRACTION PHACO AND INTRAOCULAR LENS PLACEMENT (IOC);  Surgeon: Elta Guadeloupe T. Gershon Crane, MD;  Location: [facility];  Service: Ophthalmology;  Laterality: Right;  CDE:9.05  . Cataract extraction w/phaco Left 10/23/2013    Procedure: ATTEMPTED CATARACT EXTRACTION PHACO AND INTRAOCULAR LENS PLACEMENT;  Surgeon: Elta Guadeloupe T. Gershon Crane, MD;  Location: [facility];  Service: Ophthalmology;  Laterality: Left;  CDE:  4.41   Family History  Problem Relation Age of Onset  . Diabetes Sister    Social History  Substance Use Topics  . Smoking status: Never Smoker   . Smokeless tobacco: Never Used  . Alcohol Use: No   OB History    Gravida Para Term Preterm AB TAB SAB Ectopic Multiple Living   1 1 1       1      Review of Systems  Constitutional: Negative for fever and chills.  HENT: Negative for sore throat.   Eyes: Negative for redness.  Respiratory: Positive for shortness of breath.   Cardiovascular: Negative for chest pain.  Gastrointestinal: Negative for vomiting and abdominal pain.  Endocrine: Negative for polyuria.  Genitourinary: Negative for flank pain.  Musculoskeletal: Negative for back pain and neck pain.  Skin: Negative for rash.  Neurological: Negative for headaches.  Hematological: Does  not bruise/bleed easily.  Psychiatric/Behavioral: Negative for confusion.      Allergies  Review of patient's allergies indicates no known allergies.  Home Medications   Prior to Admission medications   Medication Sig Start Date End Date Taking? Authorizing Provider  cholecalciferol (VITAMIN D) 1000 UNITS tablet Take 1,000 Units by mouth daily.    Yes Historical Provider, MD  cilostazol (PLETAL) 100 MG tablet Take 100 mg by mouth 2 (two) times daily.  02/15/13  Yes Historical Provider, MD  cyclobenzaprine (FLEXERIL) 5 MG tablet Take 5 mg by mouth 2 (two) times daily as needed for muscle spasms.  06/20/13  Yes Historical Provider, MD  DULoxetine (CYMBALTA)  60 MG capsule Take 60 mg by mouth at bedtime as needed (nerve pain).  08/22/13  Yes Historical Provider, MD  gabapentin (NEURONTIN) 300 MG capsule Take 600 mg by mouth 3 (three) times daily. 03/28/13  Yes Historical Provider, MD  insulin lispro protamine-lispro (HUMALOG 50/50 MIX) (50-50) 100 UNIT/ML SUSP injection Inject 50 Units into the skin 3 (three) times daily.   Yes Historical Provider, MD  IRON PO Take 1 tablet by mouth daily.    Yes Historical Provider, MD  lisinopril-hydrochlorothiazide (PRINZIDE,ZESTORETIC) 20-12.5 MG tablet Take 1 tablet by mouth daily.   Yes Historical Provider, MD  metoprolol (LOPRESSOR) 50 MG tablet TAKE (1) TABLET BY MOUTH TWICE DAILY. 03/06/15  Yes Arnoldo Lenis, MD  Multiple Vitamin (MULTIVITAMIN WITH MINERALS) TABS Take 1 tablet by mouth once a week.    Yes Historical Provider, MD  pravastatin (PRAVACHOL) 40 MG tablet Take 40 mg by mouth daily.   Yes Historical Provider, MD  rOPINIRole (REQUIP) 2 MG tablet Take 2 mg by mouth every evening.    Yes Historical Provider, MD  spironolactone (ALDACTONE) 25 MG tablet Take 25 mg by mouth daily. 02/15/13  Yes Historical Provider, MD  traMADol (ULTRAM) 50 MG tablet Take 1 tablet (50 mg total) by mouth every 6 (six) hours as needed. 12/05/13  Yes Julianne Rice, MD   BP 178/75 mmHg  Pulse 102  Temp(Src) 99.8 F (37.7 C) (Oral)  Resp 14  Ht 5\' 10"  (1.778 m)  Wt 122.471 kg  BMI 38.74 kg/m2  SpO2 93%  LMP 06/01/1983 Physical Exam  Constitutional: She is oriented to person, place, and time. She appears well-developed and well-nourished. No distress.  HENT:  Mouth/Throat: Oropharynx is clear and moist.  Eyes: Conjunctivae are normal. No scleral icterus.  Neck: Neck supple. No tracheal deviation present.  Cardiovascular: Normal rate, regular rhythm, normal heart sounds and intact distal pulses.  Exam reveals no gallop and no friction rub.   No murmur heard. Pulmonary/Chest: Effort normal. No respiratory distress.   Bibasilar rales  Abdominal: Soft. Normal appearance. She exhibits no distension. There is no tenderness.  Musculoskeletal:  Right amput.  Left lower leg, mild edema.   Neurological: She is oriented to person, place, and time.  Skin: Skin is warm and dry. No rash noted. She is not diaphoretic.  Psychiatric: She has a normal mood and affect.  Nursing note and vitals reviewed.   ED Course  Procedures (including critical care time) Labs Review   Results for orders placed or performed during the hospital encounter of 07/19/15  CBC  Result Value Ref Range   WBC 7.2 4.0 - 10.5 K/uL   RBC 3.52 (L) 3.87 - 5.11 MIL/uL   Hemoglobin 9.4 (L) 12.0 - 15.0 g/dL   HCT 29.7 (L) 36.0 - 46.0 %   MCV 84.4 78.0 -  100.0 fL   MCH 26.7 26.0 - 34.0 pg   MCHC 31.6 30.0 - 36.0 g/dL   RDW 16.5 (H) 11.5 - 15.5 %   Platelets 213 150 - 400 K/uL  Comprehensive metabolic panel  Result Value Ref Range   Sodium 138 135 - 145 mmol/L   Potassium 3.3 (L) 3.5 - 5.1 mmol/L   Chloride 102 101 - 111 mmol/L   CO2 28 22 - 32 mmol/L   Glucose, Bld 137 (H) 65 - 99 mg/dL   BUN 23 (H) 6 - 20 mg/dL   Creatinine, Ser 1.35 (H) 0.44 - 1.00 mg/dL   Calcium 7.9 (L) 8.9 - 10.3 mg/dL   Total Protein 6.4 (L) 6.5 - 8.1 g/dL   Albumin 3.0 (L) 3.5 - 5.0 g/dL   AST 20 15 - 41 U/L   ALT 14 14 - 54 U/L   Alkaline Phosphatase 64 38 - 126 U/L   Total Bilirubin 0.7 0.3 - 1.2 mg/dL   GFR calc non Af Amer 39 (L) >60 mL/min   GFR calc Af Amer 45 (L) >60 mL/min   Anion gap 8 5 - 15  Troponin I  Result Value Ref Range   Troponin I 0.06 (H) <0.031 ng/mL  Brain natriuretic peptide  Result Value Ref Range   B Natriuretic Peptide 1612.0 (H) 0.0 - 100.0 pg/mL   Dg Chest 2 View  07/19/2015  CLINICAL DATA:  SOB, Patient states she went to her doctor today because for the last 3 weeks she has had problems breathing, both inspiration and expiration only able to take shallow breaths. EXAM: CHEST  2 VIEW COMPARISON:  01/13/2014 FINDINGS: Mild  enlargement of the cardiopericardial silhouette is stable. No mediastinal or hilar masses or evidence of adenopathy. There is bilateral irregular interstitial thickening. Are no areas of lung consolidation. No pleural effusion or pneumothorax. Bony thorax is demineralized but intact. IMPRESSION: 1. Mild cardiomegaly and bilateral interstitial thickening. Mild congestive heart failure suspected. 2. No evidence of pneumonia. Electronically Signed   By: Lajean Manes M.D.   On: 07/19/2015 14:06        I have personally reviewed and evaluated these images and lab results as part of my medical decision-making.   EKG Interpretation   Date/Time:  Saturday July 19 2015 13:34:01 EST Ventricular Rate:  93 PR Interval:  165 QRS Duration: 92 QT Interval:  362 QTC Calculation: 450 R Axis:   -4 Text Interpretation:  Sinus rhythm Nonspecific T wave abnormality  Confirmed by Ashok Cordia  MD, Lennette Bihari ([id number]) on 07/19/2015 2:26:22 PM      MDM   Iv ns. Continuous pulse ox and monitor. o2 [place].   Labs. Cxr.  Ecg.  Reviewed nursing notes and prior charts for additional history.   bnp elevated, cxr c/w chf. Lasix iv.  Med service/hospitalists paged for admission.     Lajean Saver, MD 07/19/15 [phone]

## 2015-07-19 NOTE — ED Notes (Signed)
At 1200 I was called by registration for "a lady with oxygen of 81% sent from her doctor". I immediately went to check on patient. At 1202 SPO2 was 91% on RA. Patient states she went to her doctor today because forthe last 3 weeks she has had problems breathing, both inspiration and expiration only able to take shallow breaths. Pt states Dr. [place] Crane put on her 3L/[place] for about an hour at his office no medications given at office. Pt stated she felt a lot better but Dr. [place] Crane instructed her to come to the ED regardless. Pt has no history of breathing problems, but does have a "mild case" of CHF. Does not know her EF. States when she lies flat at night her difficulty in breathing increases. Does not take a fluid pill on a regular basis, does not check her weight, and does not follow a fluid restriction.

## 2015-07-19 NOTE — ED Notes (Signed)
Dr.Steinl okayed for pt to take restless leg syndrome medicine.

## 2015-07-19 NOTE — H&P (Signed)
History and Physical  Mary [name] [id number] DOB: 01-Aug-1945 DOA: 07/19/2015  Referring physician: Dr Ashok Cordia, ED physician PCP: Wende Neighbors, MD   Chief Complaint: Shortness of breath  HPI: Mary [name] is a 70 y.o. female  With a history of grade 1 diastolic heart failure, diabetes, neuropathy secondary to diabetes, restless leg syndrome, right BKA, obstructive sleep apnea - untreated.  Patient presents to the hospital from primary care is office with 2 weeks of gradual onset of shortness of breath and orthopnea. This has been gradually worsening over the past several days. She saw her primary care doctor who sent her to the emergency department for evaluation. Symptoms worse with lying down and improves with sitting up. Additionally, patient has mild dyspnea with exertion. She was treated approximately 3 or 4 weeks ago for similar complaints with Lasix with good improvement. He has no chest pain.   Review of Systems:   Pt denies any fevers, chills, nausea, vomiting, diarrhea, constipation, abdominal pain, cough, wheezing, palpitations, headache, vision changes, lightheadedness, dizziness, diarrhea, constipation, melena, rectal bleeding.  Review of systems are otherwise negative  Past Medical History  Diagnosis Date  . CHF (congestive heart failure) ([place])   . Diabetes mellitus   . Restless leg syndrome 07/25/2011  . Wound, open     right foot  . Diabetes mellitus with neuropathy 07/25/2011  . Cellulitis and abscess of foot 02/12/2012  . Osteomyelitis of right foot ([place]) 02/14/2012  . Partial Achilles tendon tear 02/14/2012  . Dysrhythmia     tachy- takes Metoprolol  . OSA (obstructive sleep apnea) 07/25/2011    not using CPAP  . Arthritis     knees  . Anemia   . Complication of anesthesia     pt states after her hysterectomy the doctor said she was "wild" when she woke up    Past Surgical History  Procedure Laterality Date  . Abdominal hysterectomy    . Achilles  tendon repair    . Toe amputation      partial rt great toe  . Below knee leg amputation Right 11/25/2012    Dr Sharol Given  . Amputation Right 11/25/2012    Procedure: AMPUTATION BELOW KNEE;  Surgeon: Newt Minion, MD;  Location: [facility];  Service: Orthopedics;  Laterality: Right;  Right Below Knee Amputation  . Breast surgery Left     Lumpectomy non ca  . Cataract extraction w/phaco Right 10/09/2013    Procedure: CATARACT EXTRACTION PHACO AND INTRAOCULAR LENS PLACEMENT (IOC);  Surgeon: Elta Guadeloupe T. Gershon Crane, MD;  Location: [facility];  Service: Ophthalmology;  Laterality: Right;  CDE:9.05  . Cataract extraction w/phaco Left 10/23/2013    Procedure: ATTEMPTED CATARACT EXTRACTION PHACO AND INTRAOCULAR LENS PLACEMENT;  Surgeon: Elta Guadeloupe T. Gershon Crane, MD;  Location: [facility];  Service: Ophthalmology;  Laterality: Left;  CDE:  4.41   Social History:  reports that she has never smoked. She has never used smokeless tobacco. She reports that she does not drink alcohol or use illicit drugs. Patient lives at home & is able to participate in activities of daily living  No Known Allergies  Family History  Problem Relation Age of Onset  . Diabetes Sister      Prior to Admission medications   Medication Sig Start Date End Date Taking? Authorizing Provider  cholecalciferol (VITAMIN D) 1000 UNITS tablet Take 1,000 Units by mouth daily.    Yes Historical Provider, MD  cilostazol (PLETAL) 100 MG tablet Take 100 mg by mouth 2 (two)  times daily.  02/15/13  Yes Historical Provider, MD  cyclobenzaprine (FLEXERIL) 5 MG tablet Take 5 mg by mouth 2 (two) times daily as needed for muscle spasms.  06/20/13  Yes Historical Provider, MD  DULoxetine (CYMBALTA) 60 MG capsule Take 60 mg by mouth at bedtime as needed (nerve pain).  08/22/13  Yes Historical Provider, MD  gabapentin (NEURONTIN) 300 MG capsule Take 600 mg by mouth 3 (three) times daily. 03/28/13  Yes Historical Provider, MD  insulin lispro protamine-lispro (HUMALOG 50/50 MIX) (50-50)  100 UNIT/ML SUSP injection Inject 50 Units into the skin 3 (three) times daily.   Yes Historical Provider, MD  IRON PO Take 1 tablet by mouth daily.    Yes Historical Provider, MD  lisinopril-hydrochlorothiazide (PRINZIDE,ZESTORETIC) 20-12.5 MG tablet Take 1 tablet by mouth daily.   Yes Historical Provider, MD  metoprolol (LOPRESSOR) 50 MG tablet TAKE (1) TABLET BY MOUTH TWICE DAILY. 03/06/15  Yes Arnoldo Lenis, MD  Multiple Vitamin (MULTIVITAMIN WITH MINERALS) TABS Take 1 tablet by mouth once a week.    Yes Historical Provider, MD  pravastatin (PRAVACHOL) 40 MG tablet Take 40 mg by mouth daily.   Yes Historical Provider, MD  rOPINIRole (REQUIP) 2 MG tablet Take 2 mg by mouth every evening.    Yes Historical Provider, MD  spironolactone (ALDACTONE) 25 MG tablet Take 25 mg by mouth daily. 02/15/13  Yes Historical Provider, MD  traMADol (ULTRAM) 50 MG tablet Take 1 tablet (50 mg total) by mouth every 6 (six) hours as needed. 12/05/13  Yes Julianne Rice, MD    Physical Exam: BP 178/75 mmHg  Pulse 102  Temp(Src) 99.8 F (37.7 C) (Oral)  Resp 14  Ht 5\' 10"  (1.778 m)  Wt 122.471 kg (270 lb)  BMI 38.74 kg/m2  SpO2 93%  LMP 06/01/1983  General: Elderly Caucasian female.. Awake and alert and oriented x3. No acute cardiopulmonary distress.  Eyes: Pupils equal, round, reactive to light. Extraocular muscles are intact. Sclerae anicteric and noninjected.  ENT:  Moist mucosal membranes. No mucosal lesions.  Neck: Neck supple without lymphadenopathy. No carotid bruits. No masses palpated.  Cardiovascular: Regular rate with normal S1-S2 sounds. No murmurs, rubs, gallops auscultated. Slightly increased JVD. 2+ edema in the left leg Respiratory: Mild basilar rales.  Diminished breath sounds throughout.  Abdomen: Obese. Soft, nontender, nondistended. Active bowel sounds. No masses or hepatosplenomegaly  Skin: Dry, warm to touch. 2+ dorsalis pedis and radial pulses. Musculoskeletal: No calf or leg pain.  All major joints not erythematous nontender.  Psychiatric: Intact judgment and insight.  Neurologic: No focal neurological deficits. Cranial nerves II through XII are grossly intact.           Labs on Admission:  Basic Metabolic Panel:  Recent Labs Lab 07/19/15 1325  NA 138  K 3.3*  CL 102  CO2 28  GLUCOSE 137*  BUN 23*  CREATININE 1.35*  CALCIUM 7.9*   Liver Function Tests:  Recent Labs Lab 07/19/15 1325  AST 20  ALT 14  ALKPHOS 64  BILITOT 0.7  PROT 6.4*  ALBUMIN 3.0*   No results for input(s): LIPASE, AMYLASE in the last 168 hours. No results for input(s): AMMONIA in the last 168 hours. CBC:  Recent Labs Lab 07/19/15 1325  WBC 7.2  HGB 9.4*  HCT 29.7*  MCV 84.4  PLT 213   Cardiac Enzymes:  Recent Labs Lab 07/19/15 1325  TROPONINI 0.06*    BNP (last 3 results)  Recent Labs  07/19/15 1325  BNP 1612.0*    ProBNP (last 3 results) No results for input(s): PROBNP in the last 8760 hours.  CBG: No results for input(s): GLUCAP in the last 168 hours.  Radiological Exams on Admission: Dg Chest 2 View  07/19/2015  CLINICAL DATA:  SOB, Patient states she went to her doctor today because for the last 3 weeks she has had problems breathing, both inspiration and expiration only able to take shallow breaths. EXAM: CHEST  2 VIEW COMPARISON:  01/13/2014 FINDINGS: Mild enlargement of the cardiopericardial silhouette is stable. No mediastinal or hilar masses or evidence of adenopathy. There is bilateral irregular interstitial thickening. Are no areas of lung consolidation. No pleural effusion or pneumothorax. Bony thorax is demineralized but intact. IMPRESSION: 1. Mild cardiomegaly and bilateral interstitial thickening. Mild congestive heart failure suspected. 2. No evidence of pneumonia. Electronically Signed   By: Lajean Manes M.D.   On: 07/19/2015 14:06    EKG: Independently reviewed. Sinus rhythm. Lining of the T waves in lateral leads. No acute ST  elevation or depression.  Assessment/Plan Present on Admission:  . Acute on chronic diastolic heart failure ([place]) . Acute respiratory failure ([place]) . Elevated troponin  This patient was discussed with the ED physician, including pertinent vitals, physical exam findings, labs, and imaging.  We also discussed care given by the ED provider.  #1 acute respiratory failure  Admit  Nasal cannula for oxygen therapy #2 acute on chronic diastolic heart failure  Patient 1 dose of Lasix in the ER with good diuresis.  Continue Lasix 40 mg IV twice a day  Replace potassium  Continue ACE inhibitor, spironolactone, beta blocker  Echocardiogram in the morning  Repeat potassium and creatinine in the morning. #3 elevated troponin  Likely strain secondary to heart failure  We'll trend troponins every 3 hours 3 #4 diabetes  Change the patient to Levemir 30 units twice a day with 10 units pre-meal and sliding scale  CBG before meals and daily at bedtime  DVT prophylaxis: Lovenox  Consultants: None  Code Status: Full code  Family Communication: None   Disposition Plan: Monitor admission   Truett Mainland, DO [facility] Pager [phone]

## 2015-07-20 ENCOUNTER — Observation Stay (HOSPITAL_BASED_OUTPATIENT_CLINIC_OR_DEPARTMENT_OTHER): Payer: PPO

## 2015-07-20 ENCOUNTER — Encounter (HOSPITAL_COMMUNITY): Payer: Self-pay | Admitting: *Deleted

## 2015-07-20 DIAGNOSIS — N179 Acute kidney failure, unspecified: Secondary | ICD-10-CM | POA: Diagnosis not present

## 2015-07-20 DIAGNOSIS — I10 Essential (primary) hypertension: Secondary | ICD-10-CM | POA: Diagnosis not present

## 2015-07-20 DIAGNOSIS — E162 Hypoglycemia, unspecified: Secondary | ICD-10-CM | POA: Diagnosis not present

## 2015-07-20 DIAGNOSIS — E876 Hypokalemia: Secondary | ICD-10-CM | POA: Diagnosis not present

## 2015-07-20 DIAGNOSIS — I501 Left ventricular failure: Secondary | ICD-10-CM | POA: Diagnosis not present

## 2015-07-20 DIAGNOSIS — I5033 Acute on chronic diastolic (congestive) heart failure: Secondary | ICD-10-CM | POA: Diagnosis not present

## 2015-07-20 LAB — BASIC METABOLIC PANEL
Anion gap: 8 (ref 5–15)
BUN: 28 mg/dL — AB (ref 6–20)
CALCIUM: 7.7 mg/dL — AB (ref 8.9–10.3)
CO2: 30 mmol/L (ref 22–32)
CREATININE: 1.58 mg/dL — AB (ref 0.44–1.00)
Chloride: 103 mmol/L (ref 101–111)
GFR calc non Af Amer: 32 mL/min — ABNORMAL LOW (ref >60)
GFR, EST AFRICAN AMERICAN: 37 mL/min — AB (ref >60)
GLUCOSE: 108 mg/dL — AB (ref 65–99)
Potassium: 4 mmol/L (ref 3.5–5.1)
Sodium: 141 mmol/L (ref 135–145)

## 2015-07-20 LAB — GLUCOSE, CAPILLARY
GLUCOSE-CAPILLARY: 150 mg/dL — AB (ref 65–99)
Glucose-Capillary: 72 mg/dL (ref 65–99)
Glucose-Capillary: 119 mg/dL — ABNORMAL HIGH (ref 65–99)

## 2015-07-20 LAB — I-STAT TROPONIN, ED: Troponin i, poc: 0.02 ng/mL (ref 0.00–0.08)

## 2015-07-20 LAB — CBG MONITORING, ED: Glucose-Capillary: 83 mg/dL (ref 65–99)

## 2015-07-20 MED ORDER — INFLUENZA VAC SPLIT QUAD 0.5 ML IM SUSY
0.5000 mL | PREFILLED_SYRINGE | INTRAMUSCULAR | Status: AC
  Administered 2015-07-21: 0.5 mL via INTRAMUSCULAR
  Filled 2015-07-20: qty 0.5

## 2015-07-20 MED ORDER — FUROSEMIDE 40 MG PO TABS
40.0000 mg | ORAL_TABLET | Freq: Every day | ORAL | Status: DC
  Administered 2015-07-21: 40 mg via ORAL
  Filled 2015-07-20: qty 1

## 2015-07-20 MED ORDER — FUROSEMIDE 10 MG/ML IJ SOLN: 40.0000 mg | Freq: Every day | INTRAMUSCULAR | Status: DC

## 2015-07-20 NOTE — Progress Notes (Signed)
Mary [name] arrived to unit rm#[id number].   Report received from [facility] ED, RN.

## 2015-07-20 NOTE — ED Notes (Signed)
Patient assist to bedside commode, tolerated well

## 2015-07-20 NOTE — ED Notes (Signed)
CBG 83 

## 2015-07-20 NOTE — Progress Notes (Signed)
[facility] PROGRESS NOTE  Mary [name] [id number] DOB: [birth year] DOA: 07/19/2015 PCP: Wende Neighbors, MD  Assessment/Plan: 1. Acute on chronic diastolic congestive heart failure -Mary [name] presents with complaints of shortness of breath, bilateral edema, orthopnea becoming progressively worse. She has a history of chronic diastolic congestive heart failure with last transthoracic echocardiogram performed on 01/14/2014 which revealed a preserved ejection fraction of 55-60% with grade 1 diastolic dysfunction. -Will continue Lasix at 40 mg PO daily. -Plan to repeat a transthoracic echocardiogram -Monitor ins and outs and daily weights  2.  Acute kidney injury. -Her creatinine trending up from 1.35 on 07/19/2015 to 1.58 on this mornings labs. Likely secondary to volume depletion -Will decrease decrease lasix from IV form to PO 40 mg q daily.  -Will stop ACE inhibitor -Repeat Labs in AM  3.  Hypokalemia -Likely secondary to diuretic therapy -Had potassium of 3.3 on presentation, improved to 4.0 with replacement  4.  Insulin Dependent DM -She is on Levemir 35 units [place] BID with sliding scale coverage -Follow blood sugars  Code Status: Full Code Family Communication:  Disposition Plan:    HPI/Subjective: Mary [name] is a pleasant 70 year old female with multiple comorbidities including history of right below the knee amputation, diastolic congestive heart failure, presented to the emergency department on 07/19/2015 with complaints of shortness of breath. She stated feeling short of breath for approximately 3 weeks and had been prescribed Lasix in the outpatient setting. Symptoms significantly worsening over the past 24 hours for which she presented to the emergency room. Initial workup included a chest x-ray that did not reveal infiltrate however showed findings consistent with CHF. Lab work revealed elevated BNP 1612. She was given IV Lasix 40 mg having subsequent urine  output.  Objective: Filed Vitals:   07/20/15 0602 07/20/15 0730  BP: 139/64 146/67  Pulse: 85 115  Temp: 98.3 F (36.8 C) 98.4 F (36.9 C)  Resp: 24 22   No intake or output data in the 24 hours ending 07/20/15 1110 Filed Weights   07/19/15 1224  Weight: 122.471 kg (270 lb)    Exam:   General:  No acute distress, she reports feeling better  Cardiovascular: RRR normal S1S2  Respiratory: Lungs clear to auscultation normal inspiratory effort  Abdomen: Soft, nontender nondistended  Musculoskeletal: s/p right BKA  Data Reviewed: Basic Metabolic Panel:  Recent Labs Lab 07/19/15 1325 07/20/15 0551  NA 138 141  K 3.3* 4.0  CL 102 103  CO2 28 30  GLUCOSE 137* 108*  BUN 23* 28*  CREATININE 1.35* 1.58*  CALCIUM 7.9* 7.7*   Liver Function Tests:  Recent Labs Lab 07/19/15 1325  AST 20  ALT 14  ALKPHOS 64  BILITOT 0.7  PROT 6.4*  ALBUMIN 3.0*   No results for input(s): LIPASE, AMYLASE in the last 168 hours. No results for input(s): AMMONIA in the last 168 hours. CBC:  Recent Labs Lab 07/19/15 1325  WBC 7.2  HGB 9.4*  HCT 29.7*  MCV 84.4  PLT 213   Cardiac Enzymes:  Recent Labs Lab 07/19/15 1325  TROPONINI 0.06*   BNP (last 3 results)  Recent Labs  07/19/15 1325  BNP 1612.0*    ProBNP (last 3 results) No results for input(s): PROBNP in the last 8760 hours.  CBG:  Recent Labs Lab 07/19/15 2234 07/20/15 0731  GLUCAP 256* 83    No results found for this or any previous visit (from the past 240 hour(s)).   Studies: Dg Chest 2 View  07/19/2015  CLINICAL DATA:  SOB, Patient states she went to her doctor today because for the last 3 weeks she has had problems breathing, both inspiration and expiration only able to take shallow breaths. EXAM: CHEST  2 VIEW COMPARISON:  01/13/2014 FINDINGS: Mild enlargement of the cardiopericardial silhouette is stable. No mediastinal or hilar masses or evidence of adenopathy. There is bilateral irregular  interstitial thickening. Are no areas of lung consolidation. No pleural effusion or pneumothorax. Bony thorax is demineralized but intact. IMPRESSION: 1. Mild cardiomegaly and bilateral interstitial thickening. Mild congestive heart failure suspected. 2. No evidence of pneumonia. Electronically Signed   By: Lajean Manes M.D.   On: 07/19/2015 14:06    Scheduled Meds: . cilostazol  100 mg Oral BID  . enoxaparin (LOVENOX) injection  40 mg Subcutaneous Q24H  . furosemide  40 mg Intravenous BID  . gabapentin  600 mg Oral TID  . lisinopril  20 mg Oral Daily   And  . hydrochlorothiazide  12.5 mg Oral Daily  . insulin aspart  0-20 Units Subcutaneous TID WC  . insulin aspart  0-5 Units Subcutaneous QHS  . insulin aspart  8 Units Subcutaneous TID WC  . insulin detemir  35 Units Subcutaneous BID  . metoprolol  25 mg Oral BID  . potassium chloride  40 mEq Oral BID  . pravastatin  40 mg Oral q1800  . rOPINIRole  2 mg Oral QPM  . sodium chloride flush  3 mL Intravenous Q12H  . spironolactone  25 mg Oral Daily   Continuous Infusions: . sodium chloride      Active Problems:   Acute on chronic diastolic heart failure (HCC)   Diabetes mellitus with neuropathy   Acute respiratory failure (H[facility])   Elevated troponin    Time spent: 35 min    Aadarsh Cozort, [place] Pager [phone]. If 7PM-7AM, please contact night-coverage at [email or web address], password [facility] 07/20/2015, 11:10 AM

## 2015-07-20 NOTE — ED Notes (Signed)
Patient assisted to bedside commode, tolerated well with assist

## 2015-07-21 ENCOUNTER — Ambulatory Visit (HOSPITAL_COMMUNITY): Payer: PPO | Admitting: Physical Therapy

## 2015-07-21 DIAGNOSIS — N179 Acute kidney failure, unspecified: Secondary | ICD-10-CM | POA: Diagnosis not present

## 2015-07-21 DIAGNOSIS — E162 Hypoglycemia, unspecified: Secondary | ICD-10-CM | POA: Diagnosis not present

## 2015-07-21 DIAGNOSIS — I5033 Acute on chronic diastolic (congestive) heart failure: Secondary | ICD-10-CM | POA: Diagnosis not present

## 2015-07-21 LAB — GLUCOSE, CAPILLARY
GLUCOSE-CAPILLARY: 30 mg/dL — AB (ref 65–99)
GLUCOSE-CAPILLARY: 58 mg/dL — AB (ref 65–99)
Glucose-Capillary: 114 mg/dL — ABNORMAL HIGH (ref 65–99)
Glucose-Capillary: 207 mg/dL — ABNORMAL HIGH (ref 65–99)
Glucose-Capillary: 95 mg/dL (ref 65–99)
Glucose-Capillary: 151 mg/dL — ABNORMAL HIGH (ref 65–99)

## 2015-07-21 LAB — BASIC METABOLIC PANEL
Anion gap: 9 (ref 5–15)
BUN: 34 mg/dL — AB (ref 6–20)
CHLORIDE: 103 mmol/L (ref 101–111)
CO2: 30 mmol/L (ref 22–32)
CREATININE: 1.55 mg/dL — AB (ref 0.44–1.00)
Calcium: 7.8 mg/dL — ABNORMAL LOW (ref 8.9–10.3)
GFR calc Af Amer: 38 mL/min — ABNORMAL LOW (ref >60)
GFR calc non Af Amer: 33 mL/min — ABNORMAL LOW (ref >60)
Glucose, Bld: 35 mg/dL — CL (ref 65–99)
POTASSIUM: 3.9 mmol/L (ref 3.5–5.1)
SODIUM: 142 mmol/L (ref 135–145)

## 2015-07-21 LAB — CBC
HCT: 27.6 % — ABNORMAL LOW (ref 36.0–46.0)
HEMOGLOBIN: 8.6 g/dL — AB (ref 12.0–15.0)
MCH: 26.7 pg (ref 26.0–34.0)
MCHC: 31.2 g/dL (ref 30.0–36.0)
MCV: 85.7 fL (ref 78.0–100.0)
Platelets: 230 10*3/uL (ref 150–400)
RBC: 3.22 MIL/uL — ABNORMAL LOW (ref 3.87–5.11)
RDW: 16.1 % — ABNORMAL HIGH (ref 11.5–15.5)
WBC: 8.8 10*3/uL (ref 4.0–10.5)

## 2015-07-21 MED ORDER — NYSTATIN 100000 UNIT/GM EX CREA
TOPICAL_CREAM | Freq: Two times a day (BID) | CUTANEOUS | Status: DC
  Administered 2015-07-21: 16:00:00 via TOPICAL
  Administered 2015-07-21: 1 via TOPICAL
  Administered 2015-07-22: 10:00:00 via TOPICAL
  Filled 2015-07-21: qty 15

## 2015-07-21 MED ORDER — DEXTROSE 50 % IV SOLN
INTRAVENOUS | Status: AC
  Filled 2015-07-21: qty 50

## 2015-07-21 MED ORDER — DEXTROSE 50 % IV SOLN
1.0000 | Freq: Once | INTRAVENOUS | Status: AC
  Administered 2015-07-21: 50 mL via INTRAVENOUS

## 2015-07-21 MED ORDER — GLUCOSE 40 % PO GEL
ORAL | Status: AC
  Administered 2015-07-21: 08:00:00
  Filled 2015-07-21: qty 1

## 2015-07-21 MED ORDER — INSULIN DETEMIR 100 UNIT/ML ~~LOC~~ SOLN
15.0000 [IU] | Freq: Two times a day (BID) | SUBCUTANEOUS | Status: DC
  Administered 2015-07-21: 15 [IU] via SUBCUTANEOUS
  Filled 2015-07-21 (×5): qty 0.15

## 2015-07-21 NOTE — Progress Notes (Signed)
CRITICAL VALUE ALERT  Critical value received:  Glucose 35  Date of notification:  07/21/15  Time of notification:  0808  Critical value read back:Yes.    Nurse who received alert:  Sharyn Blitz, RN  MD notified (1st page):  Dr. Coralyn Pear  Time of first page:  0808  MD notified (2nd page):  Time of second page:  Responding MD:  Dr. Coralyn Pear  Time MD responded:  [phone]

## 2015-07-21 NOTE — Progress Notes (Addendum)
[facility] PROGRESS NOTE  NESSIE HOBERMAN [id number] DOB: [birth year] DOA: 07/19/2015 PCP: Wende Neighbors, MD  Assessment/Plan: 1. Acute on chronic diastolic congestive heart failure -Mrs. Aydt presents with complaints of shortness of breath, bilateral edema, orthopnea becoming progressively worse. She has a history of chronic diastolic congestive heart failure with last transthoracic echocardiogram performed on 01/14/2014 which revealed a preserved ejection fraction of 55-60% with grade 1 diastolic dysfunction. -Transthoracic echocardiogram performed on 07/20/2015 showing preserved ejection fraction of 60-65% with grade 2 diastolic dysfunction. -Will continue Lasix at 40 mg PO daily.  2.  Acute kidney injury. -Her creatinine trending up from 1.35 on 07/19/2015 to 1.58 on this mornings labs. Likely secondary to volume depletion -Will decrease decrease lasix from IV form to PO 40 mg q daily.  -Stopped ACE inhibitor -Repeat a.m. labs showing stabilize creatinine of 1.5  3.  Hypokalemia -Likely secondary to diuretic therapy -Had potassium of 3.3 on presentation, improved to 4.0 with replacement  4.  Insulin Dependent DM with hypoglycemia -She was hypoglycemic this morning having a blood sugar of 30. I noted her to be lethargic. Instructed RN to administer oral and IV glucose immediately. Repeat blood sugar of 112 having symptomatic improvement. -I have discontinued her Levemir.  -Will monitor blood sugars over the course of the day ensuring they remain stable.  Code Status: Full Code Family Communication:  Disposition Plan:    HPI/Subjective: Mrs. Hemmert is a pleasant 70 year old female with multiple comorbidities including history of right below the knee amputation, diastolic congestive heart failure, presented to the emergency department on 07/19/2015 with complaints of shortness of breath. She stated feeling short of breath for approximately 3 weeks and had been prescribed  Lasix in the outpatient setting. Symptoms significantly worsening over the past 24 hours for which she presented to the emergency room. Initial workup included a chest x-ray that did not reveal infiltrate however showed findings consistent with CHF. Lab work revealed elevated BNP 1612. She was given IV Lasix 40 mg having subsequent urine output.  Objective: Filed Vitals:   07/20/15 2140 07/21/15 0511  BP: 131/64 176/76  Pulse: 80 86  Temp: 98.8 F (37.1 C) 98.4 F (36.9 C)  Resp: 20 20    Intake/Output Summary (Last 24 hours) at 07/21/15 0944 Last data filed at 07/21/15 0514  Gross per 24 hour  Intake    243 ml  Output    600 ml  Net   -357 ml   Filed Weights   07/19/15 1224 07/20/15 1224 07/21/15 0511  Weight: 122.471 kg (270 lb) 122.6 kg (270 lb 4.5 oz) 123.696 kg (272 lb 11.2 oz)    Exam:   General:  Patient was somewhat confused disoriented appearing to be lethargic. Found to have a blood sugar of 30 with symptoms improving after the administration of glucose.  Cardiovascular: RRR normal S1S2  Respiratory: Lungs clear to auscultation normal inspiratory effort  Abdomen: Soft, nontender nondistended  Musculoskeletal: s/p right BKA  Data Reviewed: Basic Metabolic Panel:  Recent Labs Lab 07/19/15 1325 07/20/15 0551 07/21/15 0645  NA 138 141 142  K 3.3* 4.0 3.9  CL 102 103 103  CO2 28 30 30   GLUCOSE 137* 108* 35*  BUN 23* 28* 34*  CREATININE 1.35* 1.58* 1.55*  CALCIUM 7.9* 7.7* 7.8*   Liver Function Tests:  Recent Labs Lab 07/19/15 1325  AST 20  ALT 14  ALKPHOS 64  BILITOT 0.7  PROT 6.4*  ALBUMIN 3.0*   No results for input(s): LIPASE,  AMYLASE in the last 168 hours. No results for input(s): AMMONIA in the last 168 hours. CBC:  Recent Labs Lab 07/19/15 1325 07/21/15 0645  WBC 7.2 8.8  HGB 9.4* 8.6*  HCT 29.7* 27.6*  MCV 84.4 85.7  PLT 213 230   Cardiac Enzymes:  Recent Labs Lab 07/19/15 1325  TROPONINI 0.06*   BNP (last 3  results)  Recent Labs  07/19/15 1325  BNP 1612.0*    ProBNP (last 3 results) No results for input(s): PROBNP in the last 8760 hours.  CBG:  Recent Labs Lab 07/19/15 2234 07/20/15 0731 07/20/15 1348 07/20/15 1559 07/20/15 2049  GLUCAP 256* 83 72 119* 150*    No results found for this or any previous visit (from the past 240 hour(s)).   Studies: Dg Chest 2 View  07/19/2015  CLINICAL DATA:  SOB, Patient states she went to her doctor today because for the last 3 weeks she has had problems breathing, both inspiration and expiration only able to take shallow breaths. EXAM: CHEST  2 VIEW COMPARISON:  01/13/2014 FINDINGS: Mild enlargement of the cardiopericardial silhouette is stable. No mediastinal or hilar masses or evidence of adenopathy. There is bilateral irregular interstitial thickening. Are no areas of lung consolidation. No pleural effusion or pneumothorax. Bony thorax is demineralized but intact. IMPRESSION: 1. Mild cardiomegaly and bilateral interstitial thickening. Mild congestive heart failure suspected. 2. No evidence of pneumonia. Electronically Signed   By: Lajean Manes M.D.   On: 07/19/2015 14:06    Scheduled Meds: . cilostazol  100 mg Oral BID  . enoxaparin (LOVENOX) injection  40 mg Subcutaneous Q24H  . furosemide  40 mg Oral Daily  . gabapentin  600 mg Oral TID  . hydrochlorothiazide  12.5 mg Oral Daily  . Influenza vac split quadrivalent PF  0.5 mL Intramuscular Tomorrow-1000  . insulin aspart  0-20 Units Subcutaneous TID WC  . insulin aspart  0-5 Units Subcutaneous QHS  . insulin aspart  8 Units Subcutaneous TID WC  . metoprolol  25 mg Oral BID  . potassium chloride  40 mEq Oral BID  . pravastatin  40 mg Oral q1800  . rOPINIRole  2 mg Oral QPM  . sodium chloride flush  3 mL Intravenous Q12H  . spironolactone  25 mg Oral Daily   Continuous Infusions:    Active Problems:   Acute on chronic diastolic heart failure ([place])   Diabetes mellitus with  neuropathy   Acute respiratory failure (HCC)   Elevated troponin    Time spent: 35 min    Shishir Krantz, [place] [facility] Pager [phone]. If 7PM-7AM, please contact night-coverage at [email or web address], password [facility] 07/21/2015, 9:44 AM

## 2015-07-21 NOTE — Care Management Note (Deleted)
Case Management Note  Patient Details  Name: Mary [name] MRN: [id number] Date of Birth: 17-Feb-1946  Subjective/Objective:        Spoke with patient and daughter who stated that she was POA concerning discharge planning. Patient is from [facility] assisted [facility]. Informed CSW of patient desire to return to [place] at time of discharge.            Action/Plan:  Return to [facility] at discharge.  Expected Discharge Date:                  Expected Discharge Plan:  [place]  In-House Referral:     Discharge planning Services  CM Consult  Post Acute Care Choice:    Choice offered to:     DME Arranged:    DME Agency:     HH Arranged:    [place] Agency:     Status of Service:  In process, will continue to follow  Medicare Important Message Given:    Date Medicare IM Given:    Medicare IM give by:    Date Additional Medicare IM Given:    Additional Medicare Important Message give by:     If discussed at [place] of Stay Meetings, dates discussed:    Additional Comments:  Alvie Heidelberg, RN 07/21/2015, 4:18 PM

## 2015-07-21 NOTE — Progress Notes (Signed)
Hypoglycemic Event  CBG: 30  Treatment: 15 GM gel and D50 IV 50 mL  Symptoms: Pale, Sweaty and Lethargic  Follow-up CBG: [id number] CBG Result:114  Possible Reasons for Event: Medication regimen: Lantus  Comments/MD notified:Dr. Coralyn Pear at bedside at time [facility] was obtained.  Orders given.  Will follow and will continue to monitor patient.    Osaze Hubbert, Franc[place] Found

## 2015-07-21 NOTE — Progress Notes (Signed)
Inpatient Diabetes Program Recommendations  AACE/ADA: New Consensus Statement on Inpatient Glycemic Control (2015)  Target Ranges:  Prepandial:   less than 140 mg/dL      Peak postprandial:   less than 180 mg/dL (1-2 hours)      Critically ill patients:  140 - 180 mg/dL   Review of Glycemic Control:  Results for Mary [name], Mary [name] (MRN [id number]) as of 07/21/2015 14:00  Ref. Range 07/20/2015 15:59 07/20/2015 20:49 07/21/2015 07:54 07/21/2015 08:50 07/21/2015 12:25  Glucose-Capillary Latest Ref Range: 65-99 mg/dL 119 (H) 150 (H) 30 (LL) 114 (H) 207 (H)   Diabetes history: Diabetes mellitus Outpatient Diabetes medications: Humalog 50/50- 50 units tid Current orders for Inpatient glycemic control:  Novolog resistant tid with meals and HS,  Novolog 8 units tid with meals Note that Levemir d/c'd this morning due to low blood sugars  Inpatient Diabetes Program Recommendations:    Once CBG's increased >180 mg/dL, consider restarting 1/2 of Levemir which was previously ordered.  Consider Levemir 15 units bid.  Will follow.  Thanks, Adah Perl, RN, BC-ADM Inpatient Diabetes Coordinator Pager [phone] (8a-5p)

## 2015-07-21 NOTE — Progress Notes (Signed)
Hypoglycemic Event  CBG: 54  Treatment: 15 GM carbohydrate snack  Symptoms: None  Follow-up CBG: Time: 1832 CBG Result: 95  Possible Reasons for Event: Unknown  Comments/MD notified: Dr. Coralyn Pear paged and made aware.    Zeya Balles, Francetta Found

## 2015-07-21 NOTE — Care Management Note (Signed)
Case Management Note  Patient Details  Name: BINETA COFFING MRN: [id number] Date of Birth: 08/11/1945  Subjective/Objective:              Spoke with patient who is alert and oriented from home with husband. Uses walker, has prosthetic leg from BKA, stated tht she gets around well.  No O2 at home. Will need O2 qualification if remains on here. Attempting to wean. Denies issues with meds or transportation. No CM needs identified.      Action/Plan:  Home with self care.  Expected Discharge Date:                  Expected Discharge Plan:  Home/Self Care  In-House Referral:     Discharge planning Services  CM Consult  Post Acute Care Choice:    Choice offered to:     DME Arranged:    DME Agency:     HH Arranged:    [place] Agency:     Status of Service:  Completed, signed off  Medicare Important Message Given:    Date Medicare IM Given:    Medicare IM give by:    Date Additional Medicare IM Given:    Additional Medicare Important Message give by:     If discussed at [place] of Stay Meetings, dates discussed:    Additional Comments:  Alvie Heidelberg, RN 07/21/2015, 4:24 PM

## 2015-07-21 NOTE — Progress Notes (Signed)
Follow up note  Restarting Levemir at 15 units [place] BID, having last blood sugar of 207

## 2015-07-22 DIAGNOSIS — I10 Essential (primary) hypertension: Secondary | ICD-10-CM | POA: Diagnosis not present

## 2015-07-22 DIAGNOSIS — I5033 Acute on chronic diastolic (congestive) heart failure: Secondary | ICD-10-CM | POA: Diagnosis not present

## 2015-07-22 DIAGNOSIS — N179 Acute kidney failure, unspecified: Secondary | ICD-10-CM | POA: Diagnosis not present

## 2015-07-22 DIAGNOSIS — E162 Hypoglycemia, unspecified: Secondary | ICD-10-CM | POA: Diagnosis not present

## 2015-07-22 LAB — BASIC METABOLIC PANEL
ANION GAP: 9 (ref 5–15)
BUN: 37 mg/dL — ABNORMAL HIGH (ref 6–20)
CALCIUM: 7.8 mg/dL — AB (ref 8.9–10.3)
CO2: 31 mmol/L (ref 22–32)
Chloride: 102 mmol/L (ref 101–111)
Creatinine, Ser: 1.68 mg/dL — ABNORMAL HIGH (ref 0.44–1.00)
GFR, EST AFRICAN AMERICAN: 35 mL/min — AB (ref >60)
GFR, EST NON AFRICAN AMERICAN: 30 mL/min — AB (ref >60)
GLUCOSE: 107 mg/dL — AB (ref 65–99)
Potassium: 4.8 mmol/L (ref 3.5–5.1)
SODIUM: 142 mmol/L (ref 135–145)

## 2015-07-22 LAB — CBC
HCT: 26.9 % — ABNORMAL LOW (ref 36.0–46.0)
HEMOGLOBIN: 8.4 g/dL — AB (ref 12.0–15.0)
MCH: 26.8 pg (ref 26.0–34.0)
MCHC: 31.2 g/dL (ref 30.0–36.0)
MCV: 85.7 fL (ref 78.0–100.0)
Platelets: 244 10*3/uL (ref 150–400)
RBC: 3.14 MIL/uL — AB (ref 3.87–5.11)
RDW: 16.3 % — ABNORMAL HIGH (ref 11.5–15.5)
WBC: 6.3 10*3/uL (ref 4.0–10.5)

## 2015-07-22 LAB — GLUCOSE, CAPILLARY
GLUCOSE-CAPILLARY: 189 mg/dL — AB (ref 65–99)
GLUCOSE-CAPILLARY: 98 mg/dL (ref 65–99)

## 2015-07-22 MED ORDER — ISOSORBIDE MONONITRATE ER 30 MG PO TB24: 30.0000 mg | ORAL_TABLET | Freq: Every day | ORAL | Status: DC

## 2015-07-22 MED ORDER — INSULIN LISPRO PROT & LISPRO (50-50 MIX) 100 UNIT/ML ~~LOC~~ SUSP: 20.0000 [IU] | Freq: Three times a day (TID) | SUBCUTANEOUS | Status: DC

## 2015-07-22 NOTE — Progress Notes (Signed)
Patient states understanding of discharge instructions, prescription given. 

## 2015-07-22 NOTE — Discharge Summary (Signed)
Physician Discharge Summary  VIENO SUTO [id number] DOB: Aug 13, 1945 DOA: 07/19/2015  PCP: Wende Neighbors, MD  Admit date: 07/19/2015 Discharge date: 07/22/2015  Time spent: 35 minutes  Recommendations for Outpatient Follow-up:  1. Please follow-up on a BMP on hospital follow-up visit, she presented with acute on chronic diastolic congestive heart failure and treated with diuretic therapy. She had creatinine of 1.68 on day of discharge. She was placed back on her home regimen with spironolactone 25 g by mouth daily. 2. Please follow-up on blood sugars, she was hypoglycemic during this hospitalization having a blood sugar of 30. She reported being on Humalog 50/50 mix at home taking 50 units 3 times a day. She was instructed to decrease to 20 units 3 times a day 3. Please follow-up on blood pressures, ACE inhibitor therapy was discontinued due to acute kidney injury. This was substituted with Imdur 30 mg by mouth daily   Discharge Diagnoses:  Active Problems:   Acute on chronic diastolic heart failure (HCC)   Diabetes mellitus with neuropathy   Acute respiratory failure (HCC)   Elevated troponin   Discharge Condition: Stable  Diet recommendation: Carb modified diet  Filed Weights   07/20/15 1224 07/21/15 0511 07/22/15 0500  Weight: 122.6 kg (270 lb 4.5 oz) 123.696 kg (272 lb 11.2 oz) 123.061 kg (271 lb 4.8 oz)    History of present illness:  Mary [name] is a 70 y.o. female  With a history of grade 1 diastolic heart failure, diabetes, neuropathy secondary to diabetes, restless leg syndrome, right BKA, obstructive sleep apnea - untreated. Patient presents to the hospital from primary care is office with 2 weeks of gradual onset of shortness of breath and orthopnea. This has been gradually worsening over the past several days. She saw her primary care doctor who sent her to the emergency department for evaluation. Symptoms worse with lying down and improves with sitting up.  Additionally, patient has mild dyspnea with exertion. She was treated approximately 3 or 4 weeks ago for similar complaints with Lasix with good improvement. He has no chest pain.  Hospital Course:  Mrs. Bullins is a pleasant 70 year old female with multiple comorbidities including history of right below the knee amputation, diastolic congestive heart failure, presented to the emergency department on 07/19/2015 with complaints of shortness of breath. She stated feeling short of breath for approximately 3 weeks and had been prescribed Lasix in the outpatient setting. Symptoms significantly worsening over the past 24 hours for which she presented to the emergency room. Initial workup included a chest x-ray that did not reveal infiltrate however showed findings consistent with CHF. Lab work revealed elevated BNP 1612. She was given IV Lasix 40 mg having subsequent urine output.  1. Acute on chronic diastolic congestive heart failure -Mrs. Ben presents with complaints of shortness of breath, bilateral edema, orthopnea becoming progressively worse. She has a history of chronic diastolic congestive heart failure with last transthoracic echocardiogram performed on 01/14/2014 which revealed a preserved ejection fraction of 55-60% with grade 1 diastolic dysfunction. -Transthoracic echocardiogram performed on 07/20/2015 showing preserved ejection fraction of 60-65% with grade 2 diastolic dysfunction. -She was discharged on spironolactone 25 mg by mouth daily which was her home regimen. -Please follow up on BMP on hospital follow-up visit, on day of discharge had a creatinine of 1.68, likely resulting from IV diuresis she received during this hospitalization.  2. Acute kidney injury. -Her creatinine trending up from 1.35 on 07/19/2015 to 1.58 on this mornings labs. Likely secondary  to volume depletion -Her Lasix dose was decreased due to upward trend and creatinine. -Stopped ACE inhibitor -As mentioned above  please follow-up on BMP on hospital follow-up visit, patient having creatinine 1.6 on day of discharge. -She was discharged on her home regimen of spironolactone 25 mg by mouth daily.  3. Hypokalemia -Likely secondary to diuretic therapy -Resolved with the administration of IV Lasix  4. Insulin Dependent DM with hypoglycemia -She was hypoglycemic this morning having a blood sugar of 30. I noted her to be lethargic. Instructed RN to administer oral and IV glucose immediately. Repeat blood sugar of 112 having symptomatic improvement. -I have discontinued her Levemir.  -She was discharged on Humalog 50-50 mix with instructions to decrease her dose from 50 units 3 times a day to 20 units 3 times a day   Discharge Exam: Filed Vitals:   07/21/15 2148 07/22/15 0650  BP: 152/64 147/70  Pulse: 93 88  Temp: 98.9 F (37.2 C) 98.2 F (36.8 C)  Resp: 20 20    General: She is in no acute distress, looking for to going home today Cardiovascular: Regular rate and rhythm normal S1-S2 no murmurs or gallops Respiratory: Normal respiratory effort, breathing comfortably Extremities: Status post right below-the-knee amputation  Discharge Instructions   Discharge Instructions    Diet - low sodium heart healthy    Complete by:  As directed      Increase activity slowly    Complete by:  As directed           Current Discharge Medication List    START taking these medications   Details  isosorbide mononitrate (IMDUR) 30 MG 24 hr tablet Take 1 tablet (30 mg total) by mouth daily. Qty: 30 tablet, Refills: 1      CONTINUE these medications which have CHANGED   Details  insulin lispro protamine-lispro (HUMALOG 50/50 MIX) (50-50) 100 UNIT/ML SUSP injection Inject 0.2 mLs (20 Units total) into the skin 3 (three) times daily. Qty: 10 mL, Refills: 11      CONTINUE these medications which have NOT CHANGED   Details  cholecalciferol (VITAMIN D) 1000 UNITS tablet Take 1,000 Units by mouth  daily.     cilostazol (PLETAL) 100 MG tablet Take 100 mg by mouth 2 (two) times daily.     cyclobenzaprine (FLEXERIL) 5 MG tablet Take 5 mg by mouth 2 (two) times daily as needed for muscle spasms.     DULoxetine (CYMBALTA) 60 MG capsule Take 60 mg by mouth at bedtime as needed (nerve pain).     gabapentin (NEURONTIN) 300 MG capsule Take 600 mg by mouth 3 (three) times daily.    IRON PO Take 1 tablet by mouth daily.     metoprolol (LOPRESSOR) 50 MG tablet TAKE (1) TABLET BY MOUTH TWICE DAILY. Qty: 60 tablet, Refills: 11    Multiple Vitamin (MULTIVITAMIN WITH MINERALS) TABS Take 1 tablet by mouth once a week.     pravastatin (PRAVACHOL) 40 MG tablet Take 40 mg by mouth daily.    rOPINIRole (REQUIP) 2 MG tablet Take 2 mg by mouth every evening.     spironolactone (ALDACTONE) 25 MG tablet Take 25 mg by mouth daily.    traMADol (ULTRAM) 50 MG tablet Take 1 tablet (50 mg total) by mouth every 6 (six) hours as needed. Qty: 15 tablet, Refills: 0      STOP taking these medications     lisinopril-hydrochlorothiazide (PRINZIDE,ZESTORETIC) 20-12.5 MG tablet        No Known  Allergies Follow-up Information    Follow up with Wende Neighbors, MD On 07/31/2015.   Specialty:  Internal Medicine   Why:  at 4:20 pm   Contact information:   [place] [phone]        The results of significant diagnostics from this hospitalization (including imaging, microbiology, ancillary and laboratory) are listed below for reference.    Significant Diagnostic Studies: Dg Chest 2 View  07/19/2015  CLINICAL DATA:  SOB, Patient states she went to her doctor today because for the last 3 weeks she has had problems breathing, both inspiration and expiration only able to take shallow breaths. EXAM: CHEST  2 VIEW COMPARISON:  01/13/2014 FINDINGS: Mild enlargement of the cardiopericardial silhouette is stable. No mediastinal or hilar masses or evidence of adenopathy. There is  bilateral irregular interstitial thickening. Are no areas of lung consolidation. No pleural effusion or pneumothorax. Bony thorax is demineralized but intact. IMPRESSION: 1. Mild cardiomegaly and bilateral interstitial thickening. Mild congestive heart failure suspected. 2. No evidence of pneumonia. Electronically Signed   By: Lajean Manes M.D.   On: 07/19/2015 14:06    Microbiology: No results found for this or any previous visit (from the past 240 hour(s)).   Labs: Basic Metabolic Panel:  Recent Labs Lab 07/19/15 1325 07/20/15 0551 07/21/15 0645 07/22/15 0559  NA 138 141 142 142  K 3.3* 4.0 3.9 4.8  CL 102 103 103 102  CO2 28 30 30 31   GLUCOSE 137* 108* 35* 107*  BUN 23* 28* 34* 37*  CREATININE 1.35* 1.58* 1.55* 1.68*  CALCIUM 7.9* 7.7* 7.8* 7.8*   Liver Function Tests:  Recent Labs Lab 07/19/15 1325  AST 20  ALT 14  ALKPHOS 64  BILITOT 0.7  PROT 6.4*  ALBUMIN 3.0*   No results for input(s): LIPASE, AMYLASE in the last 168 hours. No results for input(s): AMMONIA in the last 168 hours. CBC:  Recent Labs Lab 07/19/15 1325 07/21/15 0645 07/22/15 0559  WBC 7.2 8.8 6.3  HGB 9.4* 8.6* 8.4*  HCT 29.7* 27.6* 26.9*  MCV 84.4 85.7 85.7  PLT 213 230 244   Cardiac Enzymes:  Recent Labs Lab 07/19/15 1325  TROPONINI 0.06*   BNP: BNP (last 3 results)  Recent Labs  07/19/15 1325  BNP 1612.0*    ProBNP (last 3 results) No results for input(s): PROBNP in the last 8760 hours.  CBG:  Recent Labs Lab 07/21/15 1711 07/21/15 1832 07/21/15 2146 07/22/15 0026 07/22/15 0744  GLUCAP 58* 95 151* 189* 98       Signed:  Kelvin Cellar MD.  [facility] 07/22/2015, 10:35 AM

## 2015-07-23 ENCOUNTER — Ambulatory Visit (HOSPITAL_COMMUNITY): Payer: PPO | Admitting: Physical Therapy

## 2015-07-23 ENCOUNTER — Telehealth (HOSPITAL_COMMUNITY): Payer: Self-pay | Admitting: Physical Therapy

## 2015-07-23 NOTE — Telephone Encounter (Signed)
Called pt and left message re: missed appointment.  Requested pt or family member to call our department to update [place] on Ms. Obeirne's status and if we should cancel future visits.  Rayetta Humphrey, [place] CLT [phone]

## 2015-07-25 ENCOUNTER — Ambulatory Visit (HOSPITAL_COMMUNITY): Payer: Self-pay | Admitting: Physical Therapy

## 2015-07-28 ENCOUNTER — Ambulatory Visit (HOSPITAL_COMMUNITY): Payer: PPO | Admitting: Physical Therapy

## 2015-07-28 DIAGNOSIS — T148XXA Other injury of unspecified body region, initial encounter: Secondary | ICD-10-CM

## 2015-07-28 DIAGNOSIS — R296 Repeated falls: Secondary | ICD-10-CM

## 2015-07-28 DIAGNOSIS — T798XXS Other early complications of trauma, sequela: Secondary | ICD-10-CM

## 2015-07-28 DIAGNOSIS — R2689 Other abnormalities of gait and mobility: Secondary | ICD-10-CM

## 2015-07-28 DIAGNOSIS — R29898 Other symptoms and signs involving the musculoskeletal system: Secondary | ICD-10-CM

## 2015-07-28 DIAGNOSIS — T148 Other injury of unspecified body region: Secondary | ICD-10-CM | POA: Diagnosis not present

## 2015-07-28 NOTE — Therapy (Addendum)
[place], [place], [address] Phone: [phone]   Fax:  [phone]  Physical Therapy Treatment  Patient Details  Name: Mary [name] MRN: [id number] Date of Birth: [birth year] Referring Provider: [place] Crane  Encounter Date: 07/28/2015      PT End of Session - 07/28/15 0836    Visit Number 7   Number of Visits 18   Authorization Type [employer]   Authorization - Visit Number 7   Authorization - Number of Visits 10   PT Start Time 0800   PT Stop Time 0845   PT Time Calculation (min) 45 min   Activity Tolerance Patient tolerated treatment well   Behavior During Therapy [facility] for tasks assessed/performed      Past Medical History  Diagnosis Date  . CHF (congestive heart failure) ([place])   . Diabetes mellitus   . Restless leg syndrome 07/25/2011  . Wound, open     right foot  . Diabetes mellitus with neuropathy 07/25/2011  . Cellulitis and abscess of foot 02/12/2012  . Osteomyelitis of right foot ([place]) 02/14/2012  . Partial Achilles tendon tear 02/14/2012  . Dysrhythmia     tachy- takes Metoprolol  . OSA (obstructive sleep apnea) 07/25/2011    not using CPAP  . Arthritis     knees  . Anemia   . Complication of anesthesia     pt states after her hysterectomy the doctor said she was "wild" when she woke up     Past Surgical History  Procedure Laterality Date  . Abdominal hysterectomy    . Achilles tendon repair    . Toe amputation      partial rt great toe  . Below knee leg amputation Right 11/25/2012    Dr Sharol Given  . Amputation Right 11/25/2012    Procedure: AMPUTATION BELOW KNEE;  Surgeon: Newt Minion, MD;  Location: [place];  Service: Orthopedics;  Laterality: Right;  Right Below Knee Amputation  . Breast surgery Left     Lumpectomy non ca  . Cataract extraction w/phaco Right 10/09/2013    Procedure: CATARACT EXTRACTION PHACO AND INTRAOCULAR LENS PLACEMENT (IOC);  Surgeon: Elta Guadeloupe T. Gershon Crane, MD;  Location:  [facility];  Service: Ophthalmology;  Laterality: Right;  CDE:9.05  . Cataract extraction w/phaco Left 10/23/2013    Procedure: ATTEMPTED CATARACT EXTRACTION PHACO AND INTRAOCULAR LENS PLACEMENT;  Surgeon: Elta Guadeloupe T. Gershon Crane, MD;  Location: [facility];  Service: Ophthalmology;  Laterality: Left;  CDE:  4.41    There were no vitals filed for this visit.  Visit Diagnosis:  Nonhealing nonsurgical wound  Unstable balance  Falls frequently  Leg weakness, bilateral  Infected wound, sequela      Subjective Assessment - 07/28/15 0832    Subjective Pt states she was admitted into the hospital for 3 days for low 02 sats, later diagnosed as CHF.  Pt reports she has no energy today, no pain but has soreness on distal end of Rt residual limb.   States it was 15/10 over the weekend when she weight beared and spouse states she was hopping around on it.  States she left her leg on for 2 days while in the hospital because the lasix made her get up so frequently to urinate.       Currently in Pain? No/denies                         [facility] Adult PT Treatment/Exercise -  07/28/15 0827    Knee/Hip Exercises: Supine   Single Leg Bridge Left;20 reps   Straight Leg Raises Strengthening;Both;15 reps   Other Supine Knee/Hip Exercises pilates 100,(pt completed all 100   Other Supine Knee/Hip Exercises chest press with 5# bar 15 reps, horiz abd 3# bar 10 reps each (with abdominal stab)   Knee/Hip Exercises: Sidelying   Hip ABduction Strengthening;Both;15 reps   Knee/Hip Exercises: Prone   Hamstring Curl 15 reps   Hip Extension Both;15 reps   Manual Therapy   Manual therapy comments debrided dry scab from anterior surface with complete healing underneath.  Distal end is red, sore to touch but without heat.                 PT Education - 07/28/15 0910    Education provided Yes   Education Details importance fo keeping socket off as much as possible, instructed to call prothestist to inspect  prosthesis   Person(s) Educated Patient;Spouse   Methods Explanation;Demonstration   Comprehension Verbalized understanding          PT Short Term Goals - 07/28/15 0912    PT SHORT TERM GOAL #1   Title Pt wounds to be 100% granualted in order for healing to begin   Time 1   Period Weeks   Status Achieved   PT SHORT TERM GOAL #2   Title Pt to be I in HEP to increase strength to improve safety    Time 2   Period Weeks   Status On-going  doesn't always do these according to spouse           PT Long Term Goals - 07/28/15 0913    PT LONG TERM GOAL #1   Title PT to be Indpendent in advance  in HEP    Time 6   Period Weeks   Status On-going   PT LONG TERM GOAL #2   Title PT wounds to be 50% smaller    Time 6   Period Weeks   Status Achieved   PT LONG TERM GOAL #3   Title PT family to be Independent in dressing wounds.   Time 6   Period Weeks   Status Achieved   PT LONG TERM GOAL #4   Title Pt to be able to stand for 5 minutes without UE assist to allow pt to perform self grooming while standing with reduce risk of falling   Time 6   Period Weeks   Status On-going   PT LONG TERM GOAL #5   Title Pt to demonstrate improved power and strength by being able to come sit to stand one time without UE assist for reduced risk of falling    Time 6   Period Weeks   Status On-going               Plan - 07/28/15 0836    Clinical Impression Statement No re-eval needed today s/p hospital admission per PT Tawni Levy).  Pt comes today with general fatigue but no repiratory distress.  Monitored 02 sats during treatment with range of 88-95%.  Wounds are now healed on distal end of residual limb, however noted redness and bogginess in spots.  PT also reports soreness to touch.  Pt continues to wear cut up hospital gripper sock on tip of stump and then socket.  Pt also continues to keep this on frequently throughtout the day due to restless leg and frequent urination.   discussed other options with pt, i.e  sliding board transfers to [facility] and hopping with walker and Lt LE only, however pt adamantly refuses to try either.  Attempted to work on STS using walker without Rt LE prosthesis on but again refused stating she was too scared and Lt LE too weak.  Resumed LE, UE and core therex.  Instructed patient to contact prothetic to adjust socks and check the wear of prosthesis as stump looks irritated.  Pt and spouse verbally agreed.     PT Next Visit Plan Continue to check Rt residual limb and resume woundcare if needed.  Progress therex as able.  Continue to focus on strengthening at his time and progress balance.       [id number] CK [id number] CJ   Problem List Patient Active Problem List   Diagnosis Date Noted  . Elevated troponin 07/19/2015  . Diastolic CHF, acute on chronic (HCC) 01/13/2014  . Expressive aphasia 01/13/2014  . CHF (congestive heart failure) ([place])   . Unstable balance 01/16/2013  . Difficulty in walking(719.7) 01/16/2013  . Infected wound ([place]) 01/16/2013  . Hx of right BKA ([place]) 12/23/2012  . Candidiasis of breast 12/08/2012  . S/P bilateral BKA (below knee amputation) ([place]) 11/30/2012  . Diabetes ([place]) 11/30/2012  . Diabetic foot ulcer ([place]) 10/08/2012  . Anxiety 02/17/2012  . Osteomyelitis of right foot ([place]) 02/14/2012  . Hematuria, microscopic 02/13/2012  . PVD (peripheral vascular disease) ([place]) 02/08/2012  . Open wound of heel 02/08/2012  . Hyperlipidemia 11/11/2011  . Hypertension 11/11/2011  . Morbid obesity ([place]) 11/11/2011  . Tachycardia 08/04/2011  . Acute on chronic diastolic heart failure ([place]) 07/25/2011  . Diabetes mellitus with neuropathy 07/25/2011  . OSA (obstructive sleep apnea) 07/25/2011  . Generalized weakness 07/25/2011  . Acute respiratory failure ([place]) 07/25/2011  . Restless leg syndrome 07/25/2011  . Anemia 07/25/2011    Teena Irani, PTA/CLT [phone] 07/28/2015, 9:17 AM  [place] [address] [place], [place], [address] Phone: [phone]   Fax:  [phone]  Name: Mary [name] MRN: [id number] Date of Birth: March 06, 1946  PHYSICAL THERAPY DISCHARGE SUMMARY  Visits from Start of Care: 7  Current functional level related to goals / functional outcomes: Wounds healed; Pt still has weaknes   Remaining deficits: weakness   Education / Equipment: HEP  Plan: Patient agrees to discharge.  Patient goals were partially met. Patient is being discharged due to not returning since the last visit.  ?????       Rayetta Humphrey, [place] CLT [phone]

## 2015-07-30 ENCOUNTER — Ambulatory Visit (HOSPITAL_COMMUNITY): Payer: PPO | Admitting: Physical Therapy

## 2015-08-01 ENCOUNTER — Telehealth (HOSPITAL_COMMUNITY): Payer: Self-pay | Admitting: Physical Therapy

## 2015-08-01 ENCOUNTER — Ambulatory Visit (HOSPITAL_COMMUNITY): Payer: PPO | Admitting: Physical Therapy

## 2015-08-01 NOTE — Telephone Encounter (Signed)
Pt NS for 3/1 appointment and called 3/3 to cancel her appt with secretary.  Per Leafy Ro, pt stated she wanted to be discharged since her wound was healed.  Called patient back and explained we were still working on strengthening.  Pt reported she felt she no longer needed this and requested to be discharged from therapy.  Evaluating therapist notified.  Teena Irani, PTA/CLT [phone]

## 2016-04-08 ENCOUNTER — Other Ambulatory Visit: Payer: Self-pay | Admitting: Cardiology

## 2016-04-13 ENCOUNTER — Emergency Department (HOSPITAL_COMMUNITY): Payer: PPO

## 2016-04-13 ENCOUNTER — Encounter (HOSPITAL_COMMUNITY): Payer: Self-pay | Admitting: Emergency Medicine

## 2016-04-13 ENCOUNTER — Emergency Department (HOSPITAL_COMMUNITY)
Admission: EM | Admit: 2016-04-13 | Discharge: 2016-04-13 | Disposition: A | Discharge status: Home / Self Care | Payer: PPO | Attending: Emergency Medicine | Admitting: Emergency Medicine

## 2016-04-13 DIAGNOSIS — R0602 Shortness of breath: Secondary | ICD-10-CM | POA: Diagnosis present

## 2016-04-13 DIAGNOSIS — Z794 Long term (current) use of insulin: Secondary | ICD-10-CM | POA: Insufficient documentation

## 2016-04-13 DIAGNOSIS — I5033 Acute on chronic diastolic (congestive) heart failure: Secondary | ICD-10-CM | POA: Diagnosis not present

## 2016-04-13 DIAGNOSIS — Z791 Long term (current) use of non-steroidal anti-inflammatories (NSAID): Secondary | ICD-10-CM | POA: Diagnosis not present

## 2016-04-13 DIAGNOSIS — Z79899 Other long term (current) drug therapy: Secondary | ICD-10-CM | POA: Diagnosis not present

## 2016-04-13 DIAGNOSIS — E119 Type 2 diabetes mellitus without complications: Secondary | ICD-10-CM | POA: Diagnosis not present

## 2016-04-13 DIAGNOSIS — I110 Hypertensive heart disease with heart failure: Secondary | ICD-10-CM | POA: Insufficient documentation

## 2016-04-13 DIAGNOSIS — R609 Edema, unspecified: Secondary | ICD-10-CM

## 2016-04-13 DIAGNOSIS — R600 Localized edema: Secondary | ICD-10-CM | POA: Insufficient documentation

## 2016-04-13 LAB — CBC WITH DIFFERENTIAL/PLATELET
BASOS ABS: 0 10*3/uL (ref 0.0–0.1)
Basophils Relative: 0 %
Eosinophils Absolute: 0.4 10*3/uL (ref 0.0–0.7)
Eosinophils Relative: 4 %
HEMATOCRIT: 32.1 % — AB (ref 36.0–46.0)
Hemoglobin: 9.9 g/dL — ABNORMAL LOW (ref 12.0–15.0)
LYMPHS ABS: 1 10*3/uL (ref 0.7–4.0)
LYMPHS PCT: 11 %
MCH: 27.2 pg (ref 26.0–34.0)
MCHC: 30.8 g/dL (ref 30.0–36.0)
MCV: 88.2 fL (ref 78.0–100.0)
MONO ABS: 1.1 10*3/uL — AB (ref 0.1–1.0)
Monocytes Relative: 12 %
NEUTROS ABS: 6.4 10*3/uL (ref 1.7–7.7)
Neutrophils Relative %: 73 %
Platelets: 226 10*3/uL (ref 150–400)
RBC: 3.64 MIL/uL — AB (ref 3.87–5.11)
RDW: 18.2 % — AB (ref 11.5–15.5)
WBC: 8.9 10*3/uL (ref 4.0–10.5)

## 2016-04-13 LAB — URINE MICROSCOPIC-ADD ON

## 2016-04-13 LAB — BASIC METABOLIC PANEL
Anion gap: 5 (ref 5–15)
BUN: 25 mg/dL — ABNORMAL HIGH (ref 6–20)
CHLORIDE: 106 mmol/L (ref 101–111)
CO2: 26 mmol/L (ref 22–32)
CREATININE: 1.18 mg/dL — AB (ref 0.44–1.00)
Calcium: 7.7 mg/dL — ABNORMAL LOW (ref 8.9–10.3)
GFR calc non Af Amer: 46 mL/min — ABNORMAL LOW (ref >60)
GFR, EST AFRICAN AMERICAN: 53 mL/min — AB (ref >60)
GLUCOSE: 183 mg/dL — AB (ref 65–99)
Potassium: 4 mmol/L (ref 3.5–5.1)
Sodium: 137 mmol/L (ref 135–145)

## 2016-04-13 LAB — URINALYSIS, ROUTINE W REFLEX MICROSCOPIC
Bilirubin Urine: NEGATIVE
GLUCOSE, UA: 100 mg/dL — AB
Ketones, ur: NEGATIVE mg/dL
LEUKOCYTES UA: NEGATIVE
Nitrite: NEGATIVE
PH: 6 (ref 5.0–8.0)
Protein, ur: 100 mg/dL — AB
Specific Gravity, Urine: 1.02 (ref 1.005–1.030)

## 2016-04-13 LAB — D-DIMER, QUANTITATIVE (NOT AT ARMC): D DIMER QUANT: 3.05 ug{FEU}/mL — AB (ref 0.00–0.50)

## 2016-04-13 LAB — TROPONIN I
TROPONIN I: 0.03 ng/mL — AB (ref <0.03)
Troponin I: 0.03 ng/mL (ref <0.03)

## 2016-04-13 MED ORDER — IOPAMIDOL (ISOVUE-370) INJECTION 76%
100.0000 mL | Freq: Once | INTRAVENOUS | Status: AC | PRN
  Administered 2016-04-13: 100 mL via INTRAVENOUS

## 2016-04-13 MED ORDER — FUROSEMIDE 10 MG/ML IJ SOLN
40.0000 mg | Freq: Once | INTRAMUSCULAR | Status: AC
  Administered 2016-04-13: 40 mg via INTRAVENOUS
  Filled 2016-04-13: qty 4

## 2016-04-13 MED ORDER — FUROSEMIDE 20 MG PO TABS: 20.0000 mg | ORAL_TABLET | Freq: Two times a day (BID) | ORAL | 0 refills | Status: DC

## 2016-04-13 MED ORDER — TRAMADOL HCL 50 MG PO TABS
ORAL_TABLET | ORAL | Status: AC
  Filled 2016-04-13: qty 1

## 2016-04-13 MED ORDER — GABAPENTIN 300 MG PO CAPS
900.0000 mg | ORAL_CAPSULE | Freq: Once | ORAL | Status: AC
  Administered 2016-04-13: 900 mg via ORAL
  Filled 2016-04-13: qty 3

## 2016-04-13 MED ORDER — TRAMADOL HCL 50 MG PO TABS
50.0000 mg | ORAL_TABLET | Freq: Once | ORAL | Status: AC
  Administered 2016-04-13: 50 mg via ORAL

## 2016-04-13 MED ORDER — TRAMADOL HCL 50 MG PO TABS
50.0000 mg | ORAL_TABLET | Freq: Once | ORAL | Status: AC
  Administered 2016-04-13: 50 mg via ORAL
  Filled 2016-04-13: qty 1

## 2016-04-13 NOTE — ED Provider Notes (Signed)
[facility] DEPT Provider Note   CSN: [id number] Arrival date & time: 04/13/16  1635     History   Chief Complaint Chief Complaint  Patient presents with  . Shortness of Breath    HPI Mary [name] is a 70 y.o. female.  She is here today for evaluation of swelling, left-sided, primarily left breast, and left leg. She has a right leg BKA. Also complains of shortness of breath and weakness. She denies chest pain, nausea, vomiting, fever, cough, change in appetite or bowel movements. She is taking her usual medications. There are no other known modifying factors.  HPI  Past Medical History:  Diagnosis Date  . Anemia   . Arthritis    knees  . Cellulitis and abscess of foot 02/12/2012  . CHF (congestive heart failure) ([place])   . Complication of anesthesia    pt states after her hysterectomy the doctor said she was "wild" when she woke up   . Diabetes mellitus   . Diabetes mellitus with neuropathy 07/25/2011  . Dysrhythmia    tachy- takes Metoprolol  . OSA (obstructive sleep apnea) 07/25/2011   not using CPAP  . Osteomyelitis of right foot ([place]) 02/14/2012  . Partial Achilles tendon tear 02/14/2012  . Restless leg syndrome 07/25/2011  . Wound, open    right foot    Patient Active Problem List   Diagnosis Date Noted  . Elevated troponin 07/19/2015  . Diastolic CHF, acute on chronic (HCC) 01/13/2014  . Expressive aphasia 01/13/2014  . CHF (congestive heart failure) ([place])   . Unstable balance 01/16/2013  . Difficulty in walking(719.7) 01/16/2013  . Infected wound 01/16/2013  . Hx of right BKA ([place]) 12/23/2012  . Candidiasis of breast 12/08/2012  . S/P bilateral BKA (below knee amputation) ([place]) 11/30/2012  . Diabetes ([place]) 11/30/2012  . Diabetic foot ulcer ([place]) 10/08/2012  . Anxiety 02/17/2012  . Osteomyelitis of right foot ([place]) 02/14/2012  . Hematuria, microscopic 02/13/2012  . PVD (peripheral vascular disease) ([place]) 02/08/2012  . Open wound of heel  02/08/2012  . Hyperlipidemia 11/11/2011  . Hypertension 11/11/2011  . Morbid obesity ([place]) 11/11/2011  . Tachycardia 08/04/2011  . Acute on chronic diastolic heart failure ([place]) 07/25/2011  . Diabetes mellitus with neuropathy 07/25/2011  . OSA (obstructive sleep apnea) 07/25/2011  . Generalized weakness 07/25/2011  . Acute respiratory failure ([place]) 07/25/2011  . Restless leg syndrome 07/25/2011  . Anemia 07/25/2011    Past Surgical History:  Procedure Laterality Date  . ABDOMINAL HYSTERECTOMY    . ACHILLES TENDON REPAIR    . AMPUTATION Right 11/25/2012   Procedure: AMPUTATION BELOW KNEE;  Surgeon: Newt Minion, MD;  Location: [facility];  Service: Orthopedics;  Laterality: Right;  Right Below Knee Amputation  . BELOW KNEE LEG AMPUTATION Right 11/25/2012   Dr Sharol Given  . BREAST SURGERY Left    Lumpectomy non ca  . CATARACT EXTRACTION W/PHACO Right 10/09/2013   Procedure: CATARACT EXTRACTION PHACO AND INTRAOCULAR LENS PLACEMENT (IOC);  Surgeon: Elta Guadeloupe T. Gershon Crane, MD;  Location: [facility];  Service: Ophthalmology;  Laterality: Right;  CDE:9.05  . CATARACT EXTRACTION W/PHACO Left 10/23/2013   Procedure: ATTEMPTED CATARACT EXTRACTION PHACO AND INTRAOCULAR LENS PLACEMENT;  Surgeon: Elta Guadeloupe T. Gershon Crane, MD;  Location: [facility];  Service: Ophthalmology;  Laterality: Left;  CDE:  4.41  . TOE AMPUTATION     partial rt great toe    OB History    Gravida Para Term Preterm AB Living   1 1 1  1   SAB TAB Ectopic Multiple Live Births                   Home Medications    Prior to Admission medications   Medication Sig Start Date End Date Taking? Authorizing Provider  cholecalciferol (VITAMIN D) 1000 UNITS tablet Take 1,000 Units by mouth daily.    Yes Historical Provider, MD  cilostazol (PLETAL) 100 MG tablet Take 100 mg by mouth 2 (two) times daily.  02/15/13  Yes Historical Provider, MD  compounded topicals builder Apply 1 application topically. KET/DIC/BAC/LID/M/GUA   Yes Historical Provider, MD    DULoxetine (CYMBALTA) 60 MG capsule Take 60 mg by mouth every morning.  08/22/13  Yes Historical Provider, MD  ferrous sulfate 325 (65 FE) MG tablet Take 325 mg by mouth 3 (three) times daily with meals.    Yes Historical Provider, MD  gabapentin (NEURONTIN) 800 MG tablet Take 800 mg by mouth 3 (three) times daily.  04/09/16  Yes Historical Provider, MD  insulin lispro protamine-lispro (HUMALOG 50/50 MIX) (50-50) 100 UNIT/ML SUSP injection Inject 0.2 mLs (20 Units total) into the skin 3 (three) times daily. Patient taking differently: Inject 20-25 Units into the skin 3 (three) times daily. Patient self adjusts amount based on sugar levels 07/22/15  Yes Kelvin Cellar, MD  lisinopril-hydrochlorothiazide (PRINZIDE,ZESTORETIC) 20-12.5 MG tablet Take 1 tablet by mouth daily.  04/08/16  Yes Historical Provider, MD  metFORMIN (GLUCOPHAGE) 1000 MG tablet Take 1,000 mg by mouth 2 (two) times daily with a meal.  04/08/16  Yes Historical Provider, MD  metoprolol (LOPRESSOR) 50 MG tablet TAKE (1) TABLET BY MOUTH TWICE DAILY. 04/08/16  Yes Arnoldo Lenis, MD  Multiple Vitamin (MULTIVITAMIN WITH MINERALS) TABS Take 1 tablet by mouth once a week.    Yes Historical Provider, MD  pravastatin (PRAVACHOL) 40 MG tablet Take 40 mg by mouth daily.   Yes Historical Provider, MD  rOPINIRole (REQUIP) 2 MG tablet Take 2 mg by mouth every evening.    Yes Historical Provider, MD  spironolactone (ALDACTONE) 25 MG tablet Take 25 mg by mouth daily. 02/15/13  Yes Historical Provider, MD  Tolnaftate (ABSORBINE JR EX) Apply 1 application topically daily as needed (for pain).   Yes Historical Provider, MD  traMADol (ULTRAM) 50 MG tablet Take 1 tablet (50 mg total) by mouth every 6 (six) hours as needed. 12/05/13  Yes Julianne Rice, MD  furosemide (LASIX) 20 MG tablet Take 1 tablet (20 mg total) by mouth 2 (two) times daily. 04/13/16   Daleen Bo, MD    Family History Family History  Problem Relation Age of Onset  . Diabetes  Sister     Social History Social History  Substance Use Topics  . Smoking status: Never Smoker  . Smokeless tobacco: Never Used  . Alcohol use No     Allergies   Patient has no known allergies.   Review of Systems Review of Systems  All other systems reviewed and are negative.    Physical Exam Updated Vital Signs BP 188/94 (BP Location: Left Arm)   Pulse 70   Temp 98.5 F (36.9 C) (Oral)   Resp 20   Ht 5\' 10"  (1.778 m)   Wt 290 lb (131.5 kg)   LMP 06/01/1983   SpO2 95%   BMI 41.61 kg/m   Physical Exam  Constitutional: She is oriented to person, place, and time. She appears well-developed. No distress.  Morbidly obese  HENT:  Head: Normocephalic and atraumatic.  Eyes: Conjunctivae and EOM are normal. Pupils are equal, round, and reactive to light.  Neck: Normal range of motion and phonation normal. Neck supple.  Cardiovascular: Normal rate and regular rhythm.   Pulmonary/Chest: Effort normal and breath sounds normal. She exhibits no tenderness.  Very large breasts bilaterally.  Abdominal: Soft. She exhibits no distension. There is no tenderness. There is no guarding.  Musculoskeletal: Normal range of motion.  1-2+ edema, left leg. Right leg BKA. No significant thigh swelling, or presacral swelling.  Neurological: She is alert and oriented to person, place, and time. She exhibits normal muscle tone.  Skin: Skin is warm and dry.  Psychiatric: She has a normal mood and affect. Her behavior is normal. Judgment and thought content normal.  Nursing note and vitals reviewed.    ED Treatments / Results  Labs (all labs ordered are listed, but only abnormal results are displayed) Labs Reviewed  BASIC METABOLIC PANEL - Abnormal; Notable for the following:       Result Value   Glucose, Bld 183 (*)    BUN 25 (*)    Creatinine, Ser 1.18 (*)    Calcium 7.7 (*)    GFR calc non Af Amer 46 (*)    GFR calc Af Amer 53 (*)    All other components within normal limits    TROPONIN I - Abnormal; Notable for the following:    Troponin I 0.03 (*)    All other components within normal limits  D-DIMER, QUANTITATIVE (NOT AT [facility]) - Abnormal; Notable for the following:    D-Dimer, Quant 3.05 (*)    All other components within normal limits  CBC WITH DIFFERENTIAL/PLATELET - Abnormal; Notable for the following:    RBC 3.64 (*)    Hemoglobin 9.9 (*)    HCT 32.1 (*)    RDW 18.2 (*)    Monocytes Absolute 1.1 (*)    All other components within normal limits  URINALYSIS, ROUTINE W REFLEX MICROSCOPIC (NOT AT [facility]) - Abnormal; Notable for the following:    Glucose, UA 100 (*)    Hgb urine dipstick LARGE (*)    Protein, ur 100 (*)    All other components within normal limits  URINE MICROSCOPIC-ADD ON - Abnormal; Notable for the following:    Squamous Epithelial / LPF 0-5 (*)    Bacteria, UA FEW (*)    All other components within normal limits  TROPONIN I    EKG  EKG Interpretation  Date/Time:  Tuesday April 13 2016 16:57:39 EST Ventricular Rate:  97 PR Interval:    QRS Duration: 95 QT Interval:  355 QTC Calculation: 451 R Axis:   -23 Text Interpretation:  Sinus rhythm Borderline left axis deviation Anterior infarct, old Nonspecific T abnormalities, lateral leads since last tracing no significant change Confirmed by Eulis Foster  MD, London Tarnowski [phone]) on 04/13/2016 5:16:48 PM       Radiology Ct Angio Chest Pe W/cm &/or Wo Cm  Result Date: 04/13/2016 CLINICAL DATA:  Edema of the left chest, abdomen, and leg. Diabetes. Shortness of breath for 2 days. EXAM: CT ANGIOGRAPHY CHEST WITH CONTRAST TECHNIQUE: Multidetector CT imaging of the chest was performed using the standard protocol during bolus administration of intravenous contrast. Multiplanar CT image reconstructions and MIPs were obtained to evaluate the vascular anatomy. CONTRAST:  100 cc Isovue 370 COMPARISON:  Multiple exams, including 06/10/2013 and chest radiograph of 07/19/2015 FINDINGS: Body habitus  reduces diagnostic sensitivity and specificity. Cardiovascular: No filling defect is identified in the pulmonary  arterial tree to suggest pulmonary embolus. Coronary, aortic arch, and branch vessel atherosclerotic vascular disease. Small to moderate pericardial effusion. Mild cardiomegaly. Mediastinum/Nodes: Mildly enlarged right paratracheal node 1.2 cm on image 31/5. Other indistinct mediastinal lymph nodes are present. Lungs/Pleura: Linear subsegmental atelectasis or scarring in the lingula. Similar subsegmental atelectasis anteriorly in the left lower lobe. Upper Abdomen: High density in the gallbladder favoring gallstones. Musculoskeletal: Irregular and potentially destructive transverse lesion of the upper sternal body with associated sclerosis. Sclerosis in the T11 vertebral body with unusual appearance of nitrogen gas phenomenon and abnormal lucency with partial collapse of the superior endplate. The nitrogen gas phenomenon is usually considered an indicator of benign etiology. There is extensive edema and cutaneous thickening in the breasts and anterior chest bilaterally, potentially greater on the left than the right, along with body wall edema along the upper abdomen. Healing fracture of the left anterior ninth rib, image 98/7. Review of the MIP images confirms the above findings. IMPRESSION: 1. No filling defect is identified in the pulmonary arterial tree to suggest pulmonary embolus. 2. Irregular sclerotic and possibly destructive transverse lesion of the upper sternal body, possibly from a healing fracture, strictly speaking I cannot exclude sternal osteomyelitis or a metastatic lesion although metastasis is considered less likely. 3. Healing fracture the left anterior ninth rib. 4. Superior endplate compression at T11 with over 50% loss of vertebral height and associated sclerosis, probably subacute, most likely to be benign. 5. Cardiomegaly with small to moderate pericardial effusion. Correlate with  any jugular venous distension in assessing for the possibility of tamponade. 6. Chest and abdominal body wall edema especially involving the breasts. 7. Suspected cholelithiasis. Electronically Signed   By: Van Clines M.D.   On: 04/13/2016 20:13    Procedures Procedures (including critical care time)  Medications Ordered in ED Medications  traMADol (ULTRAM) 50 MG tablet (not administered)  furosemide (LASIX) injection 40 mg (40 mg Intravenous Given 04/13/16 1923)  iopamidol (ISOVUE-370) 76 % injection 100 mL (100 mLs Intravenous Contrast Given 04/13/16 1940)  gabapentin (NEURONTIN) capsule 900 mg (900 mg Oral Given 04/13/16 2008)  traMADol (ULTRAM) tablet 50 mg (50 mg Oral Given 04/13/16 2008)  traMADol (ULTRAM) tablet 50 mg (50 mg Oral Given 04/13/16 2010)     Initial Impression / Assessment and Plan / ED Course  I have reviewed the triage vital signs and the nursing notes.  Pertinent labs & imaging results that were available during my care of the patient were reviewed by me and considered in my medical decision making (see chart for details).  Clinical Course as of Apr 14 2219  Tue Apr 13, 2016  1952 High D-Dimer, Quant: (!) 3.05 [EW]  1952 Slight elevation Troponin I: (!!) 0.03 [EW]  1952 Low Hemoglobin: (!) 9.9 [EW]  1952 High Glucose: (!) 183 [EW]  1952 High BUN: (!) 25 [EW]  1952 Slight elevation Creatinine: (!) 1.18 [EW]  1953 Low Calcium: (!) 7.7 [EW]  1953 Elevated Hgb urine dipstick: (!) LARGE [EW]    Clinical Course User Index [EW] Daleen Bo, MD    Medications  traMADol (ULTRAM) 50 MG tablet (not administered)  furosemide (LASIX) injection 40 mg (40 mg Intravenous Given 04/13/16 1923)  iopamidol (ISOVUE-370) 76 % injection 100 mL (100 mLs Intravenous Contrast Given 04/13/16 1940)  gabapentin (NEURONTIN) capsule 900 mg (900 mg Oral Given 04/13/16 2008)  traMADol (ULTRAM) tablet 50 mg (50 mg Oral Given 04/13/16 2008)  traMADol (ULTRAM) tablet 50 mg (50  mg Oral Given  04/13/16 2010)    Patient Vitals for the past 24 hrs:  BP Temp Temp src Pulse Resp SpO2 Height Weight  04/13/16 2211 188/94 - - 70 20 95 % - -  04/13/16 1657 187/85 - - 98 18 94 % - -  04/13/16 1641 - - - - - - 5\' 10"  (1.778 m) 290 lb (131.5 kg)  04/13/16 1640 179/100 98.5 F (36.9 C) Oral 99 26 100 % - -    9:30 PM Reevaluation with update and discussion. After initial assessment and treatment, an updated evaluation reveals She is diuresing well and is comfortable. She states she has urinated multiple times. Findings discussed with patient and all questions answered. Kaileen Bronkema L    Final Clinical Impressions(s) / ED Diagnoses   Final diagnoses:  Peripheral edema   Evaluation consistent with peripheral edema. Doubt acute congestive heart failure, PE, ACS or metabolic instability.  Second troponin, ordered for mild elevation of initial lab.  Nursing Notes Reviewed/ Care Coordinated, and agree without changes. Applicable Imaging Reviewed.  Interpretation of Laboratory Data incorporated into ED treatment  Nursing Notes Reviewed/ Care Coordinated Applicable Imaging Reviewed Interpretation of Laboratory Data incorporated into ED treatment  The patient appears reasonably screened and/or stabilized for discharge and I doubt any other medical condition or other [facility] requiring further screening, evaluation, or treatment in the ED at this time prior to discharge.  Plan: Home Medications- continue; Home Treatments- avoid salt; return here if the recommended treatment, does not improve the symptoms; Recommended follow up- PCP, one week   Dr. Alvino Chapel to evaluate second troponin prior to releasing patient from ED.    New Prescriptions New Prescriptions   FUROSEMIDE (LASIX) 20 MG TABLET    Take 1 tablet (20 mg total) by mouth 2 (two) times daily.     Daleen Bo, MD 04/13/16 2221

## 2016-04-13 NOTE — ED Notes (Signed)
Patient unsteady while ambulating. Patient states she is unsteady.

## 2016-04-13 NOTE — ED Triage Notes (Signed)
Pt reports SOB x 2 days and edema on the L side of her body. Obvious edema to breast abdomen and L leg.

## 2016-04-13 NOTE — ED Notes (Signed)
Husband at bedside. Patient states that she is not having any pain, but she has restless leg syndrome. States that she is ready to go home. Will speak with her nurse.

## 2016-04-13 NOTE — Discharge Instructions (Signed)
Avoid all salt intake.  Start taking the new diuretic medication in the morning.  See your doctor for checkup in further treatment within 1 week.

## 2016-04-13 NOTE — ED Provider Notes (Signed)
  Physical Exam  BP 188/94 (BP Location: Left Arm)   Pulse 70   Temp 98.5 F (36.9 C) (Oral)   Resp 20   Ht 5\' 10"  (1.778 m)   Wt 290 lb (131.5 kg)   LMP 06/01/1983   SpO2 95%   BMI 41.61 kg/m   Physical Exam  ED Course  Procedures  MDM Received patient in signout from Dr. Eulis Foster. CT scan reassuring before he left. Delta troponin stable. Discharge home.       Davonna Belling, MD 04/13/16 [phone]

## 2016-05-05 ENCOUNTER — Ambulatory Visit (INDEPENDENT_AMBULATORY_CARE_PROVIDER_SITE_OTHER): Payer: PPO

## 2016-05-05 ENCOUNTER — Encounter: Payer: Self-pay | Admitting: Cardiology

## 2016-05-05 ENCOUNTER — Other Ambulatory Visit: Payer: Self-pay

## 2016-05-05 ENCOUNTER — Ambulatory Visit (INDEPENDENT_AMBULATORY_CARE_PROVIDER_SITE_OTHER): Payer: PPO | Admitting: Cardiology

## 2016-05-05 VITALS — BP 169/81 | HR 94 | Ht 70.0 in | Wt 261.6 lb

## 2016-05-05 DIAGNOSIS — E782 Mixed hyperlipidemia: Secondary | ICD-10-CM | POA: Diagnosis not present

## 2016-05-05 DIAGNOSIS — I5033 Acute on chronic diastolic (congestive) heart failure: Secondary | ICD-10-CM | POA: Diagnosis not present

## 2016-05-05 DIAGNOSIS — I313 Pericardial effusion (noninflammatory): Secondary | ICD-10-CM | POA: Diagnosis not present

## 2016-05-05 DIAGNOSIS — I471 Supraventricular tachycardia, unspecified: Secondary | ICD-10-CM

## 2016-05-05 DIAGNOSIS — I3139 Other pericardial effusion (noninflammatory): Secondary | ICD-10-CM

## 2016-05-05 DIAGNOSIS — I5032 Chronic diastolic (congestive) heart failure: Secondary | ICD-10-CM

## 2016-05-05 LAB — ECHOCARDIOGRAM COMPLETE
CHL CUP DOP CALC LVOT VTI: 25.1 cm
CHL CUP RV SYS PRESS: 38 mmHg
CHL CUP TV REG PEAK VELOCITY: 274 cm/s
E/e' ratio: 24.17
EWDT: 203 ms
FS: 31 % (ref 28–44)
HEIGHTINCHES: 70 in
IVS/LV PW RATIO, ED: 0.81
LA ID, A-P, ES: 46 mm
LA diam end sys: 46 mm
LA diam index: 1.97 cm/m2
LA vol A4C: 78.7 ml
LA vol index: 30.7 mL/m2
LAVOL: 71.8 mL
LDCA: 3.8 cm2
LV E/e' medial: 24.17
LV E/e'average: 24.17
LV SIMPSON'S DISK: 58
LV e' LATERAL: 6 cm/s
LVDIAVOL: 87 mL (ref 46–106)
LVDIAVOLIN: 37 mL/m2
LVOTD: 22 mm
LVOTPV: 107 cm/s
LVOTSV: 95 mL
LVSYSVOL: 36 mL (ref 14–42)
LVSYSVOLIN: 15 mL/m2
MV Dec: 203
MV Peak grad: 8 mmHg
MV pk A vel: 128 m/s
MV pk E vel: 145 m/s
PW: 11.5 mm — AB (ref 0.6–1.1)
RV LATERAL S' VELOCITY: 15.2 cm/s
RV TAPSE: 22 mm
Stroke v: 51 ml
TDI e' lateral: 6
TDI e' medial: 6.09
TR max vel: 274 cm/s
Weight: 4185.6 oz

## 2016-05-05 NOTE — Progress Notes (Signed)
Clinical Summary Mary [name] is a 70 y.o.female seen today for follow up of the following medical problems.   1.PSVT - rare palpitations since last visit - compliant with beta blocker  2. Chronic diastolic heart failure -denies any significant DOE/SOB/ Occasonal LE edema  3. HTN - not currently on ACE or ARB, I do not have this history. She states it was stopped at some time. She thinks b/c of high potassium. - compliant with meds   4. Hyperlipidemia - compliant with staitn  5. SOB - seen in ER 04/13/16 with SOB. Reportedly had evidence of LE edema. CT PE negative for clot, cardiomegaly with small to moderate pericardial effusion. Trop 0.03 and flat.  - she reports left sided swelling at that time. She was given lasix 20mg  bid, she also took additional of her aldactone at home   - breathing has improved over the last several days.   6. Pericardial effusion - noted on CT scan 03/2016    Past Medical History:  Diagnosis Date  . Anemia   . Arthritis    knees  . Cellulitis and abscess of foot 02/12/2012  . CHF (congestive heart failure) ([place])   . Complication of anesthesia    pt states after her hysterectomy the doctor said she was "wild" when she woke up   . Diabetes mellitus   . Diabetes mellitus with neuropathy 07/25/2011  . Dysrhythmia    tachy- takes Metoprolol  . OSA (obstructive sleep apnea) 07/25/2011   not using CPAP  . Osteomyelitis of right foot ([place]) 02/14/2012  . Partial Achilles tendon tear 02/14/2012  . Restless leg syndrome 07/25/2011  . Wound, open    right foot     No Known Allergies   Current Outpatient Prescriptions  Medication Sig Dispense Refill  . cholecalciferol (VITAMIN D) 1000 UNITS tablet Take 1,000 Units by mouth daily.     . cilostazol (PLETAL) 100 MG tablet Take 100 mg by mouth 2 (two) times daily.     . compounded topicals builder Apply 1 application topically. KET/DIC/BAC/LID/M/GUA    . DULoxetine (CYMBALTA) 60 MG capsule Take  60 mg by mouth every morning.     . ferrous sulfate 325 (65 FE) MG tablet Take 325 mg by mouth 3 (three) times daily with meals.     . furosemide (LASIX) 20 MG tablet Take 1 tablet (20 mg total) by mouth 2 (two) times daily. 30 tablet 0  . gabapentin (NEURONTIN) 800 MG tablet Take 800 mg by mouth 3 (three) times daily.     . insulin lispro protamine-lispro (HUMALOG 50/50 MIX) (50-50) 100 UNIT/ML SUSP injection Inject 0.2 mLs (20 Units total) into the skin 3 (three) times daily. (Patient taking differently: Inject 20-25 Units into the skin 3 (three) times daily. Patient self adjusts amount based on sugar levels) 10 mL 11  . lisinopril-hydrochlorothiazide (PRINZIDE,ZESTORETIC) 20-12.5 MG tablet Take 1 tablet by mouth daily.     . metFORMIN (GLUCOPHAGE) 1000 MG tablet Take 1,000 mg by mouth 2 (two) times daily with a meal.     . metoprolol (LOPRESSOR) 50 MG tablet TAKE (1) TABLET BY MOUTH TWICE DAILY. 60 tablet 0  . Multiple Vitamin (MULTIVITAMIN WITH MINERALS) TABS Take 1 tablet by mouth once a week.     . pravastatin (PRAVACHOL) 40 MG tablet Take 40 mg by mouth daily.    Marland Kitchen rOPINIRole (REQUIP) 2 MG tablet Take 2 mg by mouth every evening.     Marland Kitchen spironolactone (ALDACTONE) 25  MG tablet Take 25 mg by mouth daily.    . Tolnaftate (ABSORBINE JR EX) Apply 1 application topically daily as needed (for pain).    . traMADol (ULTRAM) 50 MG tablet Take 1 tablet (50 mg total) by mouth every 6 (six) hours as needed. 15 tablet 0   No current facility-administered medications for this visit.      Past Surgical History:  Procedure Laterality Date  . ABDOMINAL HYSTERECTOMY    . ACHILLES TENDON REPAIR    . AMPUTATION Right 11/25/2012   Procedure: AMPUTATION BELOW KNEE;  Surgeon: Newt Minion, MD;  Location: [facility];  Service: Orthopedics;  Laterality: Right;  Right Below Knee Amputation  . BELOW KNEE LEG AMPUTATION Right 11/25/2012   Dr Sharol Given  . BREAST SURGERY Left    Lumpectomy non ca  . CATARACT EXTRACTION  W/PHACO Right 10/09/2013   Procedure: CATARACT EXTRACTION PHACO AND INTRAOCULAR LENS PLACEMENT (IOC);  Surgeon: Elta Guadeloupe T. Gershon Crane, MD;  Location: [facility];  Service: Ophthalmology;  Laterality: Right;  CDE:9.05  . CATARACT EXTRACTION W/PHACO Left 10/23/2013   Procedure: ATTEMPTED CATARACT EXTRACTION PHACO AND INTRAOCULAR LENS PLACEMENT;  Surgeon: Elta Guadeloupe T. Gershon Crane, MD;  Location: [facility];  Service: Ophthalmology;  Laterality: Left;  CDE:  4.41  . TOE AMPUTATION     partial rt great toe     No Known Allergies    Family History  Problem Relation Age of Onset  . Diabetes Sister      Social History Mary [name] reports that she has never smoked. She has never used smokeless tobacco. Mary [name] reports that she does not drink alcohol.   Review of Systems CONSTITUTIONAL: No weight loss, fever, chills, weakness or fatigue.  HEENT: Eyes: No visual loss, blurred vision, double vision or yellow sclerae.No hearing loss, sneezing, congestion, runny nose or sore throat.  SKIN: No rash or itching.  CARDIOVASCULAR: per hpi RESPIRATORY: per HPI GASTROINTESTINAL: No anorexia, nausea, vomiting or diarrhea. No abdominal pain or blood.  GENITOURINARY: No burning on urination, no polyuria NEUROLOGICAL: No headache, dizziness, syncope, paralysis, ataxia, numbness or tingling in the extremities. No change in bowel or bladder control.  MUSCULOSKELETAL: No muscle, back pain, joint pain or stiffness.  LYMPHATICS: No enlarged nodes. No history of splenectomy.  PSYCHIATRIC: No history of depression or anxiety.  ENDOCRINOLOGIC: No reports of sweating, cold or heat intolerance. No polyuria or polydipsia.  Marland Kitchen   Physical Examination Vitals:   05/05/16 0842  BP: (!) 169/81  Pulse: 94   Vitals:   05/05/16 0842  Weight: 261 lb 9.6 oz (118.7 kg)  Height: 5\' 10"  (1.778 m)    Gen: resting comfortably, no acute distress HEENT: no scleral icterus, pupils equal round and reactive, no palptable cervical adenopathy,    CV: RRR, no m/r/g, no jvd Resp: Clear to auscultation bilaterally GI: abdomen is soft, non-tender, non-distended, normal bowel sounds, no hepatosplenomegaly MSK: extremities are warm, no edema.  Skin: warm, no rash Neuro:  no focal deficits Psych: appropriate affect   Diagnostic Studies 02/2013 Echo LVEF 55-60%, mild-mod LVH, grade I diastolic dysfunction.    02/2013 Cartoid US IMPRESSION: Less than 50% stenosis in the right and left internal carotid arteries. There is plaque bilaterally as described above.    07/2015 echo Study Conclusions  - Left ventricle: The cavity size was normal. Wall thickness was   increased in a pattern of moderate LVH. Systolic function was   normal. The estimated ejection fraction was in the range of 60%  to 65%. Wall motion was normal; there were no regional wall   motion abnormalities. Features are consistent with a pseudonormal   left ventricular filling pattern, with concomitant abnormal   relaxation and increased filling pressure (grade 2 diastolic   dysfunction). Doppler parameters are consistent with high   ventricular filling pressure. - Aortic valve: Mildly calcified annulus. Trileaflet; mildly   thickened leaflets. Valve area (VTI): 2.51 cm^2. Valve area   (Vmax): 3.04 cm^2. - Left atrium: The atrium was mildly dilated. - Atrial septum: No defect or patent foramen ovale was identified. - Tricuspid valve: There was mild-moderate regurgitation. - Pulmonary arteries: Systolic pressure was mildly to moderately   increased. PA peak pressure: 41 mm Hg (S). - Technically adequate study  Assessment and Plan  1. PSVT - no significant symptoms, she will continue current meds  2. Chronic diastolic heart failure - recent edema resolved with recent increase in diuretics - pcp labs show significant increase in Cr, she is now off diuretics. We will repeat labs in 2 weeks.   3. HTN - elevated in clinic, however she has not taken her  meds yet today - continue to monitor.   4. Hyperlipidemia - will request most recent labs - continue statin  5. Pericardial effusion - noted on recent CT chest, we will obtain echo to further evaluate.       Arnoldo Lenis, M.D.

## 2016-05-05 NOTE — Patient Instructions (Signed)
Your physician recommends that you schedule a follow-up appointment in: [place]  Your physician recommends that you continue on your current medications as directed. Please refer to the Current Medication list given to you today.  Your physician recommends that you return for lab work BNP/BMP/MG  Your physician has requested that you have an echocardiogram TODAY. Echocardiography is a painless test that uses sound waves to create images of your heart. It provides your doctor with information about the size and shape of your heart and how well your heart's chambers and valves are working. This procedure takes approximately one hour. There are no restrictions for this procedure.  Thank you for choosing [facility]!!

## 2016-05-07 ENCOUNTER — Telehealth: Payer: Self-pay | Admitting: *Deleted

## 2016-05-07 NOTE — Telephone Encounter (Signed)
Pt aware - will have labs done prior to f/u - routed to pcp

## 2016-05-07 NOTE — Telephone Encounter (Signed)
-----   Message from Arnoldo Lenis, MD sent at 05/07/2016 12:59 PM EST ----- Echo shows heart has normal pumping function. The heart muscle is a little thicker/stiffer than normal, we had seen this before and this can cause some swelling at times. There is a small amount of fluid surrounding the heart that is overall not something to be worried about at this time. We will discuss further at our f/u   J BrancH MD

## 2016-05-12 ENCOUNTER — Other Ambulatory Visit: Payer: Self-pay | Admitting: Cardiology

## 2016-05-12 LAB — BASIC METABOLIC PANEL
BUN: 27 mg/dL — AB (ref 7–25)
CO2: 29 mmol/L (ref 20–31)
Calcium: 7.8 mg/dL — ABNORMAL LOW (ref 8.6–10.4)
Chloride: 105 mmol/L (ref 98–110)
Creat: 1.25 mg/dL — ABNORMAL HIGH (ref 0.60–0.93)
GLUCOSE: 89 mg/dL (ref 65–99)
POTASSIUM: 4.4 mmol/L (ref 3.5–5.3)
SODIUM: 140 mmol/L (ref 135–146)

## 2016-05-12 LAB — MAGNESIUM: MAGNESIUM: 1.5 mg/dL (ref 1.5–2.5)

## 2016-05-12 LAB — BRAIN NATRIURETIC PEPTIDE: Brain Natriuretic Peptide: 838.8 pg/mL — ABNORMAL HIGH (ref <100)

## 2016-05-13 ENCOUNTER — Telehealth: Payer: Self-pay | Admitting: *Deleted

## 2016-05-13 NOTE — Telephone Encounter (Signed)
-----   Message from Arnoldo Lenis, MD sent at 05/13/2016  2:54 PM EST ----- Labs show kidney function is improving. Has she had any recent swelling?  Zandra Abts MD

## 2016-05-13 NOTE — Telephone Encounter (Signed)
Pt says L foot swells when she up for a long period of time and goes down after she elevates it - routed to pcp

## 2016-05-14 NOTE — Telephone Encounter (Signed)
We will reevaluate with her appt next week  Mary Abts MD

## 2016-05-18 NOTE — Telephone Encounter (Signed)
Pt aware- 12/21 f/u

## 2016-05-20 ENCOUNTER — Encounter: Payer: Self-pay | Admitting: Adult Health

## 2016-05-20 ENCOUNTER — Ambulatory Visit (INDEPENDENT_AMBULATORY_CARE_PROVIDER_SITE_OTHER): Payer: PPO | Admitting: Adult Health

## 2016-05-20 VITALS — BP 172/98 | HR 96 | Ht 70.0 in | Wt 257.0 lb

## 2016-05-20 DIAGNOSIS — I5032 Chronic diastolic (congestive) heart failure: Secondary | ICD-10-CM

## 2016-05-20 DIAGNOSIS — I10 Essential (primary) hypertension: Secondary | ICD-10-CM | POA: Diagnosis not present

## 2016-05-20 DIAGNOSIS — Z23 Encounter for immunization: Secondary | ICD-10-CM

## 2016-05-20 DIAGNOSIS — Z79899 Other long term (current) drug therapy: Secondary | ICD-10-CM

## 2016-05-20 MED ORDER — FUROSEMIDE 20 MG PO TABS: 20.0000 mg | ORAL_TABLET | Freq: Every day | ORAL | 3 refills | Status: DC

## 2016-05-20 MED ORDER — POTASSIUM CHLORIDE ER 10 MEQ PO TBCR: 10.0000 meq | EXTENDED_RELEASE_TABLET | Freq: Every day | ORAL | 3 refills | Status: DC

## 2016-05-20 MED ORDER — LISINOPRIL-HYDROCHLOROTHIAZIDE 20-25 MG PO TABS: 1.0000 | ORAL_TABLET | Freq: Every day | ORAL | 11 refills | Status: DC

## 2016-05-20 NOTE — Patient Instructions (Addendum)
Your physician recommends that you schedule a follow-up appointment in: 1 Week   Your physician has recommended you make the following change in your medication:  Stop Taking Spironolactone  Start Taking Lisinopril-Hydrochlorothiazide 20-25 mg Two Times Daily  Start Taking Lasix 20 mg Daily  Start Potassium 10 mEq Daily   If you need a refill on your cardiac medications before your next appointment, please call your pharmacy.  Thank you for choosing [facility]!

## 2016-05-20 NOTE — Progress Notes (Signed)
Cardiology Office Note   Date:  05/20/2016   ID:  Mary, [name] January 12, 1946, MRN [id number]  PCP:  Mary Neighbors, MD  Cardiologist: Mary Spring, NP   Chief Complaint  Patient presents with  . Congestive Heart Failure     History of Present Illness: Mary [name] is a 70 y.o. female who presents for  Ongoing asseic heart failure, hypertension, hyperlipidemia, and chronic shortness of breath. He was last seen in the office for ongoing assessment by Dr. Harl [name] on 05/05/2016. At that time the patient was clinically stable concerning PSVT, she was continued on her current medication regimen and had no evidence of diastolic heart failure. The patient was scheduled for an echocardiogram due to pericardial effusion recently seen on CT scan of the chest.  Left ventricle: The cavity size was normal. Wall thickness was   increased in a pattern of mild LVH. Systolic function was normal.   The estimated ejection fraction was in the range of 55% to 60%.   Abnormal diastolic function, indeterminate grade. There is   evidence of elevated LA pressure. Wall motion was normal; there   were no regional wall motion abnormalities. - Aortic valve: Valve area (VTI): 2.89 cm^2. Valve area (Vmax):   2.43 cm^2. Valve area (Vmean): 2.78 cm^2. - Left atrium: The atrium was mildly dilated. - Atrial septum: No defect or patent foramen ovale was identified. - Pulmonary arteries: Systolic pressure was mildly increased. PA   peak pressure: 38 mm Hg (S). - Pericardium, extracardiac: Small to moderate circumferential   pericardial effusion. Most prominent adjacent to the right   atrium, measuring 1.2 cm in diastole. No evidence of tamponade   physiology by echo. - Technically adequate study.  She is here with multiple complaints. She has had LEE, breast swelling, dyspnea, and BP has been elevated. She is no longer taking lasix as she did not feel it was working for her and did not receive a Rx  for more than 15 days. She states she is tired.   Past Medical History:  Diagnosis Date  . Anemia   . Arthritis    knees  . Cellulitis and abscess of foot 02/12/2012  . CHF (congestive heart failure) ([place])   . Complication of anesthesia    pt states after her hysterectomy the doctor said she was "wild" when she woke up   . Diabetes mellitus   . Diabetes mellitus with neuropathy 07/25/2011  . Dysrhythmia    tachy- takes Metoprolol  . OSA (obstructive sleep apnea) 07/25/2011   not using CPAP  . Osteomyelitis of right foot ([place]) 02/14/2012  . Partial Achilles tendon tear 02/14/2012  . Restless leg syndrome 07/25/2011  . Wound, open    right foot    Past Surgical History:  Procedure Laterality Date  . ABDOMINAL HYSTERECTOMY    . ACHILLES TENDON REPAIR    . AMPUTATION Right 11/25/2012   Procedure: AMPUTATION BELOW KNEE;  Surgeon: Mary Minion, MD;  Location: [facility];  Service: Orthopedics;  Laterality: Right;  Right Below Knee Amputation  . BELOW KNEE LEG AMPUTATION Right 11/25/2012   Dr Sharol Given  . BREAST SURGERY Left    Lumpectomy non ca  . CATARACT EXTRACTION W/PHACO Right 10/09/2013   Procedure: CATARACT EXTRACTION PHACO AND INTRAOCULAR LENS PLACEMENT (IOC);  Surgeon: Mary Guadeloupe T. Gershon Crane, MD;  Location: [facility];  Service: Ophthalmology;  Laterality: Right;  CDE:9.05  . CATARACT EXTRACTION W/PHACO Left 10/23/2013   Procedure: ATTEMPTED CATARACT EXTRACTION PHACO AND INTRAOCULAR  LENS PLACEMENT;  Surgeon: Mary Guadeloupe T. Gershon Crane, MD;  Location: [facility];  Service: Ophthalmology;  Laterality: Left;  CDE:  4.41  . TOE AMPUTATION     partial rt great toe     Current Outpatient Prescriptions  Medication Sig Dispense Refill  . cholecalciferol (VITAMIN D) 1000 UNITS tablet Take 1,000 Units by mouth daily.     . cilostazol (PLETAL) 100 MG tablet Take 100 mg by mouth 2 (two) times daily.     . compounded topicals builder Apply 1 application topically. KET/DIC/BAC/LID/M/GUA    . DULoxetine (CYMBALTA) 60 MG  capsule Take 60 mg by mouth every morning.     . ferrous sulfate 325 (65 FE) MG tablet Take 325 mg by mouth 3 (three) times daily with meals.     . gabapentin (NEURONTIN) 800 MG tablet Take 800 mg by mouth 3 (three) times daily.     . insulin lispro protamine-lispro (HUMALOG 50/50 MIX) (50-50) 100 UNIT/ML SUSP injection Inject 0.2 mLs (20 Units total) into the skin 3 (three) times daily. (Patient taking differently: Inject 20-25 Units into the skin 3 (three) times daily. Patient self adjusts amount based on sugar levels) 10 mL 11  . metFORMIN (GLUCOPHAGE) 1000 MG tablet Take 1,000 mg by mouth 2 (two) times daily with a meal.     . metoprolol (LOPRESSOR) 50 MG tablet TAKE (1) TABLET BY MOUTH TWICE DAILY. 60 tablet 0  . Multiple Vitamin (MULTIVITAMIN WITH MINERALS) TABS Take 1 tablet by mouth once a week.     . pravastatin (PRAVACHOL) 40 MG tablet Take 40 mg by mouth daily.    Mary [name] rOPINIRole (REQUIP) 2 MG tablet Take 2 mg by mouth every evening.     . Tolnaftate (ABSORBINE JR EX) Apply 1 application topically daily as needed (for pain).    . traMADol (ULTRAM) 50 MG tablet Take 1 tablet (50 mg total) by mouth every 6 (six) hours as needed. 15 tablet 0  . furosemide (LASIX) 20 MG tablet Take 1 tablet (20 mg total) by mouth daily. 90 tablet 3  . lisinopril-hydrochlorothiazide (PRINZIDE,ZESTORETIC) 20-25 MG tablet Take 1 tablet by mouth daily. 30 tablet 11  . potassium chloride (K-DUR) 10 MEQ tablet Take 1 tablet (10 mEq total) by mouth daily. 90 tablet 3   No current facility-administered medications for this visit.     Allergies:   Patient has no known allergies.    Social History:  The patient  reports that she has never smoked. She has never used smokeless tobacco. She reports that she does not drink alcohol or use drugs.   Family History:  The patient's family history includes Diabetes in her sister.    ROS: All other systems are reviewed and negative. Unless otherwise mentioned in H&P     PHYSICAL EXAM: VS:  BP (!) 172/98   Pulse 96   Ht 5\' 10"  (1.778 m)   Wt 257 lb (116.6 kg)   LMP 06/01/1983   SpO2 94%   BMI 36.88 kg/m  , BMI Body mass index is 36.88 kg/m. GEN: Well nourished, well developed, in no acute distress  HEENT: normal  Neck: no JVD, carotid bruits, or masses Cardiac:RRR; no murmurs, rubs, or gallops,, 2+ pretibial edema in the left leg and ankles. Respiratory: Diminished in the bases. No wheezes or rhonchi GI: soft, nontender, nondistended, + BS MS: no deformity or atrophy  Right leg AKA with prosthetic leg Skin: warm and dry, no rash Neuro:  Strength and sensation are intact Psych:  euthymic mood, full affect  Recent Labs: 07/19/2015: ALT 14 04/13/2016: Hemoglobin 9.9; Platelets 226 05/12/2016: Brain Natriuretic Peptide 838.8; BUN 27; Creat 1.25; Magnesium 1.5; Potassium 4.4; Sodium 140    Lipid Panel    Component Value Date/Time   CHOL 149 01/14/2014 0548   TRIG 148 01/14/2014 0548   HDL 50 01/14/2014 0548   CHOLHDL 3.0 01/14/2014 0548   VLDL 30 01/14/2014 0548   LDLCALC 69 01/14/2014 0548      Wt Readings from Last 3 Encounters:  05/20/16 257 lb (116.6 kg)  05/05/16 261 lb 9.6 oz (118.7 kg)  04/13/16 290 lb (131.5 kg)      Other studies Reviewed: Additional studies/ records that were reviewed today include:  Review of the above records demonstrates:   Echocardiogram 05/05/2016 Left ventricle: The cavity size was normal. Wall thickness was   increased in a pattern of mild LVH. Systolic function was normal.   The estimated ejection fraction was in the range of 55% to 60%.   Abnormal diastolic function, indeterminate grade. There is   evidence of elevated LA pressure. Wall motion was normal; there   were no regional wall motion abnormalities. - Aortic valve: Valve area (VTI): 2.89 cm^2. Valve area (Vmax):   2.43 cm^2. Valve area (Vmean): 2.78 cm^2. - Left atrium: The atrium was mildly dilated. - Atrial septum: No defect or  patent foramen ovale was identified. - Pulmonary arteries: Systolic pressure was mildly increased. PA   peak pressure: 38 mm Hg (S). - Pericardium, extracardiac: Small to moderate circumferential   pericardial effusion. Most prominent adjacent to the right   atrium, measuring 1.2 cm in diastole. No evidence of tamponade   physiology by echo. - Technically adequate study.  ASSESSMENT AND PLAN:  1. Chronic Diastolic CHF: Due to breast pain and edema, will discontinue spironolactone for now. She will be placed on po lasix 20 mg daily. I will give low dose potassium replacement of 10 mEq daily as she is now of the spironolactone. She will have a BMET in one week. Will give Rx for TED hose. Needs measuring.   2. Hypertension: Not well controlled. She will have increased dose of lisinopril to 20/25mg  mg BID. Continue metoprolol . Continue low sodium diet which she is not always adherent . Follow labs.   3. Diabetes: Self reported not well controlled. Will defer to PCP for ongoing management.    Current medicines are reviewed at length with the patient today.    Labs/ tests ordered today include:  Orders Placed This Encounter  Procedures  . Flu Vaccine QUAD 36+ mos IM  . Basic Metabolic Panel (BMET)     Disposition:   FU with one week.  Signed, Jory Sims, NP  05/20/2016 5:18 PM    [facility] [address] [place], [place] [address] Phone: [phone]; Fax: [phone]

## 2016-05-20 NOTE — Progress Notes (Signed)
Name: Mary [name]    DOB: May 28, 1946  Age: 70 y.o.  MR#: [id number]       PCP:  Wende Neighbors, MD      Insurance: Payor: Tennis Must / Plan: Tennis Must / Product Type: *No Product type* /   CC:   No chief complaint on file.   VS Vitals:   05/20/16 1410  Pulse: 96  SpO2: 94%  Weight: 257 lb (116.6 kg)  Height: 5\' 10"  (1.778 m)    Weights Current Weight  05/20/16 257 lb (116.6 kg)  05/05/16 261 lb 9.6 oz (118.7 kg)  04/13/16 290 lb (131.5 kg)    Blood Pressure  BP Readings from Last 3 Encounters:  05/05/16 (!) 169/81  04/13/16 194/98  07/22/15 (!) 147/70     Admit date:  (Not on file) Last encounter with RMR:  Visit date not found   Allergy Patient has no known allergies.  Current Outpatient Prescriptions  Medication Sig Dispense Refill  . cholecalciferol (VITAMIN D) 1000 UNITS tablet Take 1,000 Units by mouth daily.     . cilostazol (PLETAL) 100 MG tablet Take 100 mg by mouth 2 (two) times daily.     . compounded topicals builder Apply 1 application topically. KET/DIC/BAC/LID/M/GUA    . DULoxetine (CYMBALTA) 60 MG capsule Take 60 mg by mouth every morning.     . ferrous sulfate 325 (65 FE) MG tablet Take 325 mg by mouth 3 (three) times daily with meals.     . gabapentin (NEURONTIN) 800 MG tablet Take 800 mg by mouth 3 (three) times daily.     . insulin lispro protamine-lispro (HUMALOG 50/50 MIX) (50-50) 100 UNIT/ML SUSP injection Inject 0.2 mLs (20 Units total) into the skin 3 (three) times daily. (Patient taking differently: Inject 20-25 Units into the skin 3 (three) times daily. Patient self adjusts amount based on sugar levels) 10 mL 11  . lisinopril-hydrochlorothiazide (PRINZIDE,ZESTORETIC) 20-12.5 MG tablet Take 1 tablet by mouth daily.     . metFORMIN (GLUCOPHAGE) 1000 MG tablet Take 1,000 mg by mouth 2 (two) times daily with a meal.     . metoprolol (LOPRESSOR) 50 MG tablet TAKE (1) TABLET BY MOUTH TWICE DAILY. 60 tablet 0  . Multiple Vitamin  (MULTIVITAMIN WITH MINERALS) TABS Take 1 tablet by mouth once a week.     . pravastatin (PRAVACHOL) 40 MG tablet Take 40 mg by mouth daily.    Marland Kitchen rOPINIRole (REQUIP) 2 MG tablet Take 2 mg by mouth every evening.     Marland Kitchen spironolactone (ALDACTONE) 25 MG tablet Take 25 mg by mouth daily.    . Tolnaftate (ABSORBINE JR EX) Apply 1 application topically daily as needed (for pain).    . traMADol (ULTRAM) 50 MG tablet Take 1 tablet (50 mg total) by mouth every 6 (six) hours as needed. 15 tablet 0   No current facility-administered medications for this visit.     Discontinued Meds:   There are no discontinued medications.  Patient Active Problem List   Diagnosis Date Noted  . Elevated troponin 07/19/2015  . Diastolic CHF, acute on chronic (HCC) 01/13/2014  . Expressive aphasia 01/13/2014  . CHF (congestive heart failure) ([place])   . Unstable balance 01/16/2013  . Difficulty in walking(719.7) 01/16/2013  . Infected wound 01/16/2013  . Hx of right BKA ([place]) 12/23/2012  . Candidiasis of breast 12/08/2012  . S/P bilateral BKA (below knee amputation) ([place]) 11/30/2012  . Diabetes ([place]) 11/30/2012  . Diabetic foot ulcer ([place]) 10/08/2012  .  Anxiety 02/17/2012  . Osteomyelitis of right foot ([place]) 02/14/2012  . Hematuria, microscopic 02/13/2012  . PVD (peripheral vascular disease) ([place]) 02/08/2012  . Open wound of heel 02/08/2012  . Hyperlipidemia 11/11/2011  . Hypertension 11/11/2011  . Morbid obesity ([place]) 11/11/2011  . Tachycardia 08/04/2011  . Acute on chronic diastolic heart failure ([place]) 07/25/2011  . Diabetes mellitus with neuropathy 07/25/2011  . OSA (obstructive sleep apnea) 07/25/2011  . Generalized weakness 07/25/2011  . Acute respiratory failure ([place]) 07/25/2011  . Restless leg syndrome 07/25/2011  . Anemia 07/25/2011    LABS    Component Value Date/Time   NA 140 05/12/2016 0847   NA 137 04/13/2016 1736   NA 142 07/22/2015 0559   K 4.4 05/12/2016 0847   K 4.0 04/13/2016 1736    K 4.8 07/22/2015 0559   CL 105 05/12/2016 0847   CL 106 04/13/2016 1736   CL 102 07/22/2015 0559   CO2 29 05/12/2016 0847   CO2 26 04/13/2016 1736   CO2 31 07/22/2015 0559   GLUCOSE 89 05/12/2016 0847   GLUCOSE 183 (H) 04/13/2016 1736   GLUCOSE 107 (H) 07/22/2015 0559   BUN 27 (H) 05/12/2016 0847   BUN 25 (H) 04/13/2016 1736   BUN 37 (H) 07/22/2015 0559   CREATININE 1.25 (H) 05/12/2016 0847   CREATININE 1.18 (H) 04/13/2016 1736   CREATININE 1.68 (H) 07/22/2015 0559   CREATININE 1.55 (H) 07/21/2015 0645   CALCIUM 7.8 (L) 05/12/2016 0847   CALCIUM 7.7 (L) 04/13/2016 1736   CALCIUM 7.8 (L) 07/22/2015 0559   GFRNONAA 46 (L) 04/13/2016 1736   GFRNONAA 30 (L) 07/22/2015 0559   GFRNONAA 33 (L) 07/21/2015 0645   GFRAA 53 (L) 04/13/2016 1736   GFRAA 35 (L) 07/22/2015 0559   GFRAA 38 (L) 07/21/2015 0645   CMP     Component Value Date/Time   NA 140 05/12/2016 0847   K 4.4 05/12/2016 0847   CL 105 05/12/2016 0847   CO2 29 05/12/2016 0847   GLUCOSE 89 05/12/2016 0847   BUN 27 (H) 05/12/2016 0847   CREATININE 1.25 (H) 05/12/2016 0847   CALCIUM 7.8 (L) 05/12/2016 0847   PROT 6.4 (L) 07/19/2015 1325   ALBUMIN 3.0 (L) 07/19/2015 1325   AST 20 07/19/2015 1325   ALT 14 07/19/2015 1325   ALKPHOS 64 07/19/2015 1325   BILITOT 0.7 07/19/2015 1325   GFRNONAA 46 (L) 04/13/2016 1736   GFRAA 53 (L) 04/13/2016 1736       Component Value Date/Time   WBC 8.9 04/13/2016 1736   WBC 6.3 07/22/2015 0559   WBC 8.8 07/21/2015 0645   HGB 9.9 (L) 04/13/2016 1736   HGB 8.4 (L) 07/22/2015 0559   HGB 8.6 (L) 07/21/2015 0645   HCT 32.1 (L) 04/13/2016 1736   HCT 26.9 (L) 07/22/2015 0559   HCT 27.6 (L) 07/21/2015 0645   MCV 88.2 04/13/2016 1736   MCV 85.7 07/22/2015 0559   MCV 85.7 07/21/2015 0645    Lipid Panel     Component Value Date/Time   CHOL 149 01/14/2014 0548   TRIG 148 01/14/2014 0548   HDL 50 01/14/2014 0548   CHOLHDL 3.0 01/14/2014 0548   VLDL 30 01/14/2014 0548   LDLCALC 69  01/14/2014 0548    ABG    Component Value Date/Time   PHART 7.401 01/13/2014 0830   PCO2ART 42.2 01/13/2014 0830   PO2ART 58.4 (L) 01/13/2014 0830   HCO3 25.7 (H) 01/13/2014 0830   TCO2 22.9 01/13/2014 0830  O2SAT 89.3 01/13/2014 0830     Lab Results  Component Value Date   TSH 2.660 01/13/2014   BNP (last 3 results)  Recent Labs  07/19/15 1325 05/12/16 0847  BNP 1,612.0* 838.8*    ProBNP (last 3 results) No results for input(s): PROBNP in the last 8760 hours.  Cardiac Panel (last 3 results) No results for input(s): CKTOTAL, CKMB, TROPONINI, RELINDX in the last 72 hours.  Iron/TIBC/Ferritin/ %Sat    Component Value Date/Time   IRON 11 (L) 02/13/2012 0800   TIBC 183 (L) 02/13/2012 0800   FERRITIN 334 (H) 02/13/2012 0800   IRONPCTSAT 6 (L) 02/13/2012 0800     EKG Orders placed or performed during the hospital encounter of 04/13/16  . EKG 12-Lead  . EKG 12-Lead  . EKG     Prior Assessment and Plan Problem List as of 05/20/2016 Reviewed: 05/20/2016  2:10 PM by Drema Dallas, CMA     Cardiovascular and Mediastinum   Acute on chronic diastolic heart failure [facility])   Last Assessment & Plan 08/04/2011 Office Visit Written 08/04/2011  2:44 PM by Lendon Colonel, NP    No evidence of fluid retention at this time. Low salt diet is provided for her review      Hypertension   Last Assessment & Plan 04/18/2012 Office Visit Written 04/18/2012 12:03 PM by Lendon Colonel, NP    Blood pressure is low normal. Dropping from 159./82 on 02/08/2012 to current level of 110/54 with 20 lb wt loss.. Will continue to monitor this as she is only on metoprolol as discussed above. Dosage adjustment is recommended for BP < 782 systolic.  Will follow.      PVD (peripheral vascular disease) (HCC)   CHF (congestive heart failure) (HCC)   Diastolic CHF, acute on chronic (HCC)     Respiratory   OSA (obstructive sleep apnea)   Last Assessment & Plan 08/04/2011 Office Visit Written  08/04/2011  2:43 PM by Lendon Colonel, NP    She has not been using CPAP as directed, and not sleeping well. I have encouraged her to use this. She is to follow-up with her PCP for further treatment.      Acute respiratory failure [facility])     Endocrine   Diabetes mellitus with neuropathy   Last Assessment & Plan 12/08/2012 Office Visit Written 12/08/2012 10:23 AM by Campbell Riches, MD    Being controlled at rehab, will need good control at d/c, home as well.       Diabetic foot ulcer ([place])   Diabetes ([place])     Musculoskeletal and Integument   Open wound of heel   Osteomyelitis of right foot [facility])   Last Assessment & Plan 12/08/2012 Office Visit Written 12/08/2012 10:26 AM by Campbell Riches, MD    Resolved.         Genitourinary   Hematuria, microscopic     Other   Generalized weakness   Restless leg syndrome   Anemia   Tachycardia   Last Assessment & Plan 04/18/2012 Office Visit Written 04/18/2012 12:01 PM by Lendon Colonel, NP    Heart rate is well controlled on metoprolol 100mg  Daily. Will continue this dose for now with refill provided.  I have cautioned her concerning BP. If she continues to lose weight, may need to adjust her dose to avoid hypotension, leading to falls or fractures.  She is followed by [facility] case manager who also checks her BP.  Hyperlipidemia   Morbid obesity ([place])   Anxiety   S/P bilateral BKA (below knee amputation) [facility])   Last Assessment & Plan 12/08/2012 Office Visit Written 12/08/2012 10:27 AM by Campbell Riches, MD    She is doing well. Her wound is healing. I asked that she call me if she has worsening erythema, wound breakdown, pain. She can stop her anbx now. Will o/w see her back prn.       Candidiasis of breast   Last Assessment & Plan 12/08/2012 Office Visit Written 12/08/2012 10:29 AM by Campbell Riches, MD    Will give her short course of diflucan if no drug interactions. Should improve with stopping anbx.       Hx of  right BKA ([place])   Unstable balance   Difficulty in walking(719.7)   Infected wound   Expressive aphasia   Elevated troponin       Imaging: No results found.

## 2016-06-03 ENCOUNTER — Ambulatory Visit (INDEPENDENT_AMBULATORY_CARE_PROVIDER_SITE_OTHER): Payer: PPO | Admitting: Adult Health

## 2016-06-03 ENCOUNTER — Encounter: Payer: Self-pay | Admitting: Adult Health

## 2016-06-03 VITALS — BP 136/66 | HR 94 | Ht 60.0 in | Wt 244.0 lb

## 2016-06-03 DIAGNOSIS — I10 Essential (primary) hypertension: Secondary | ICD-10-CM

## 2016-06-03 DIAGNOSIS — I739 Peripheral vascular disease, unspecified: Secondary | ICD-10-CM | POA: Diagnosis not present

## 2016-06-03 DIAGNOSIS — M79605 Pain in left leg: Secondary | ICD-10-CM | POA: Diagnosis not present

## 2016-06-03 LAB — BASIC METABOLIC PANEL
BUN: 31 mg/dL — ABNORMAL HIGH (ref 7–25)
CALCIUM: 8.5 mg/dL — AB (ref 8.6–10.4)
CO2: 28 mmol/L (ref 20–31)
Chloride: 105 mmol/L (ref 98–110)
Creat: 1.18 mg/dL — ABNORMAL HIGH (ref 0.60–0.93)
Glucose, Bld: 57 mg/dL — ABNORMAL LOW (ref 65–99)
POTASSIUM: 4.5 mmol/L (ref 3.5–5.3)
SODIUM: 143 mmol/L (ref 135–146)

## 2016-06-03 NOTE — Patient Instructions (Signed)
Your physician recommends that you schedule a follow-up appointment in:  1 Month   Your physician recommends that you continue on your current medications as directed. Please refer to the Current Medication list given to you today.  Your physician has requested that you have an ankle brachial index (ABI). During this test an ultrasound and blood pressure cuff are used to evaluate the arteries that supply the arms and legs with blood. Allow thirty minutes for this exam. There are no restrictions or special instructions.  If you need a refill on your cardiac medications before your next appointment, please call your pharmacy.  Thank you for choosing [facility]!

## 2016-06-03 NOTE — Progress Notes (Signed)
Name: Mary [name]    DOB: August 09, 1945  Age: 71 y.o.  MR#: [id number]       PCP:  Wende Neighbors, MD      Insurance: Payor: Tennis Must / Plan: Tennis Must / Product Type: *No Product type* /   CC:   No chief complaint on file.   VS Vitals:   06/03/16 1511  Weight: 244 lb (110.7 kg)  Height: 5' (1.524 m)    Weights Current Weight  06/03/16 244 lb (110.7 kg)  05/20/16 257 lb (116.6 kg)  05/05/16 261 lb 9.6 oz (118.7 kg)    Blood Pressure  BP Readings from Last 3 Encounters:  05/20/16 (!) 172/98  05/05/16 (!) 169/81  04/13/16 194/98     Admit date:  (Not on file) Last encounter with RMR:  05/20/2016   Allergy Patient has no known allergies.  Current Outpatient Prescriptions  Medication Sig Dispense Refill  . cholecalciferol (VITAMIN D) 1000 UNITS tablet Take 1,000 Units by mouth daily.     . cilostazol (PLETAL) 100 MG tablet Take 100 mg by mouth 2 (two) times daily.     . compounded topicals builder Apply 1 application topically. KET/DIC/BAC/LID/M/GUA    . DULoxetine (CYMBALTA) 60 MG capsule Take 60 mg by mouth every morning.     . ferrous sulfate 325 (65 FE) MG tablet Take 325 mg by mouth 3 (three) times daily with meals.     . furosemide (LASIX) 20 MG tablet Take 1 tablet (20 mg total) by mouth daily. 90 tablet 3  . gabapentin (NEURONTIN) 800 MG tablet Take 800 mg by mouth 3 (three) times daily.     . insulin lispro protamine-lispro (HUMALOG 50/50 MIX) (50-50) 100 UNIT/ML SUSP injection Inject 0.2 mLs (20 Units total) into the skin 3 (three) times daily. (Patient taking differently: Inject 20-25 Units into the skin 3 (three) times daily. Patient self adjusts amount based on sugar levels) 10 mL 11  . lisinopril-hydrochlorothiazide (PRINZIDE,ZESTORETIC) 20-25 MG tablet Take 1 tablet by mouth daily. 30 tablet 11  . metFORMIN (GLUCOPHAGE) 1000 MG tablet Take 1,000 mg by mouth 2 (two) times daily with a meal.     . metoprolol (LOPRESSOR) 50 MG tablet TAKE (1)  TABLET BY MOUTH TWICE DAILY. 60 tablet 0  . Multiple Vitamin (MULTIVITAMIN WITH MINERALS) TABS Take 1 tablet by mouth once a week.     . potassium chloride (K-DUR) 10 MEQ tablet Take 1 tablet (10 mEq total) by mouth daily. 90 tablet 3  . pravastatin (PRAVACHOL) 40 MG tablet Take 40 mg by mouth daily.    Marland Kitchen rOPINIRole (REQUIP) 2 MG tablet Take 2 mg by mouth every evening.     . Tolnaftate (ABSORBINE JR EX) Apply 1 application topically daily as needed (for pain).    . traMADol (ULTRAM) 50 MG tablet Take 1 tablet (50 mg total) by mouth every 6 (six) hours as needed. 15 tablet 0   No current facility-administered medications for this visit.     Discontinued Meds:   There are no discontinued medications.  Patient Active Problem List   Diagnosis Date Noted  . Elevated troponin 07/19/2015  . Diastolic CHF, acute on chronic (HCC) 01/13/2014  . Expressive aphasia 01/13/2014  . CHF (congestive heart failure) ([place])   . Unstable balance 01/16/2013  . Difficulty in walking(719.7) 01/16/2013  . Infected wound 01/16/2013  . Hx of right BKA ([place]) 12/23/2012  . Candidiasis of breast 12/08/2012  . S/P bilateral BKA (below knee amputation) ([place])  11/30/2012  . Diabetes ([place]) 11/30/2012  . Diabetic foot ulcer ([place]) 10/08/2012  . Anxiety 02/17/2012  . Osteomyelitis of right foot ([place]) 02/14/2012  . Hematuria, microscopic 02/13/2012  . PVD (peripheral vascular disease) ([place]) 02/08/2012  . Open wound of heel 02/08/2012  . Hyperlipidemia 11/11/2011  . Hypertension 11/11/2011  . Morbid obesity ([place]) 11/11/2011  . Tachycardia 08/04/2011  . Acute on chronic diastolic heart failure ([place]) 07/25/2011  . Diabetes mellitus with neuropathy 07/25/2011  . OSA (obstructive sleep apnea) 07/25/2011  . Generalized weakness 07/25/2011  . Acute respiratory failure ([place]) 07/25/2011  . Restless leg syndrome 07/25/2011  . Anemia 07/25/2011    LABS    Component Value Date/Time   NA 143 06/02/2016 0746   NA 140  05/12/2016 0847   NA 137 04/13/2016 1736   K 4.5 06/02/2016 0746   K 4.4 05/12/2016 0847   K 4.0 04/13/2016 1736   CL 105 06/02/2016 0746   CL 105 05/12/2016 0847   CL 106 04/13/2016 1736   CO2 28 06/02/2016 0746   CO2 29 05/12/2016 0847   CO2 26 04/13/2016 1736   GLUCOSE 57 (L) 06/02/2016 0746   GLUCOSE 89 05/12/2016 0847   GLUCOSE 183 (H) 04/13/2016 1736   BUN 31 (H) 06/02/2016 0746   BUN 27 (H) 05/12/2016 0847   BUN 25 (H) 04/13/2016 1736   CREATININE 1.18 (H) 06/02/2016 0746   CREATININE 1.25 (H) 05/12/2016 0847   CREATININE 1.18 (H) 04/13/2016 1736   CREATININE 1.68 (H) 07/22/2015 0559   CREATININE 1.55 (H) 07/21/2015 0645   CALCIUM 8.5 (L) 06/02/2016 0746   CALCIUM 7.8 (L) 05/12/2016 0847   CALCIUM 7.7 (L) 04/13/2016 1736   GFRNONAA 46 (L) 04/13/2016 1736   GFRNONAA 30 (L) 07/22/2015 0559   GFRNONAA 33 (L) 07/21/2015 0645   GFRAA 53 (L) 04/13/2016 1736   GFRAA 35 (L) 07/22/2015 0559   GFRAA 38 (L) 07/21/2015 0645   CMP     Component Value Date/Time   NA 143 06/02/2016 0746   K 4.5 06/02/2016 0746   CL 105 06/02/2016 0746   CO2 28 06/02/2016 0746   GLUCOSE 57 (L) 06/02/2016 0746   BUN 31 (H) 06/02/2016 0746   CREATININE 1.18 (H) 06/02/2016 0746   CALCIUM 8.5 (L) 06/02/2016 0746   PROT 6.4 (L) 07/19/2015 1325   ALBUMIN 3.0 (L) 07/19/2015 1325   AST 20 07/19/2015 1325   ALT 14 07/19/2015 1325   ALKPHOS 64 07/19/2015 1325   BILITOT 0.7 07/19/2015 1325   GFRNONAA 46 (L) 04/13/2016 1736   GFRAA 53 (L) 04/13/2016 1736       Component Value Date/Time   WBC 8.9 04/13/2016 1736   WBC 6.3 07/22/2015 0559   WBC 8.8 07/21/2015 0645   HGB 9.9 (L) 04/13/2016 1736   HGB 8.4 (L) 07/22/2015 0559   HGB 8.6 (L) 07/21/2015 0645   HCT 32.1 (L) 04/13/2016 1736   HCT 26.9 (L) 07/22/2015 0559   HCT 27.6 (L) 07/21/2015 0645   MCV 88.2 04/13/2016 1736   MCV 85.7 07/22/2015 0559   MCV 85.7 07/21/2015 0645    Lipid Panel     Component Value Date/Time   CHOL 149  01/14/2014 0548   TRIG 148 01/14/2014 0548   HDL 50 01/14/2014 0548   CHOLHDL 3.0 01/14/2014 0548   VLDL 30 01/14/2014 0548   LDLCALC 69 01/14/2014 0548    ABG    Component Value Date/Time   PHART 7.401 01/13/2014 0830   PCO2ART  42.2 01/13/2014 0830   PO2ART 58.4 (L) 01/13/2014 0830   HCO3 25.7 (H) 01/13/2014 0830   TCO2 22.9 01/13/2014 0830   O2SAT 89.3 01/13/2014 0830     Lab Results  Component Value Date   TSH 2.660 01/13/2014   BNP (last 3 results)  Recent Labs  07/19/15 1325 05/12/16 0847  BNP 1,612.0* 838.8*    ProBNP (last 3 results) No results for input(s): PROBNP in the last 8760 hours.  Cardiac Panel (last 3 results) No results for input(s): CKTOTAL, CKMB, TROPONINI, RELINDX in the last 72 hours.  Iron/TIBC/Ferritin/ %Sat    Component Value Date/Time   IRON 11 (L) 02/13/2012 0800   TIBC 183 (L) 02/13/2012 0800   FERRITIN 334 (H) 02/13/2012 0800   IRONPCTSAT 6 (L) 02/13/2012 0800     EKG Orders placed or performed during the hospital encounter of 04/13/16  . EKG 12-Lead  . EKG 12-Lead  . EKG     Prior Assessment and Plan Problem List as of 06/03/2016 Reviewed: 05/20/2016  2:10 PM by Drema Dallas, CMA     Cardiovascular and Mediastinum   Acute on chronic diastolic heart failure [facility])   Last Assessment & Plan 08/04/2011 Office Visit Written 08/04/2011  2:44 PM by Lendon Colonel, NP    No evidence of fluid retention at this time. Low salt diet is provided for her review      Hypertension   Last Assessment & Plan 04/18/2012 Office Visit Written 04/18/2012 12:03 PM by Lendon Colonel, NP    Blood pressure is low normal. Dropping from 159./82 on 02/08/2012 to current level of 110/54 with 20 lb wt loss.. Will continue to monitor this as she is only on metoprolol as discussed above. Dosage adjustment is recommended for BP < 568 systolic.  Will follow.      PVD (peripheral vascular disease) (HCC)   CHF (congestive heart failure) (HCC)   Diastolic  CHF, acute on chronic (HCC)     Respiratory   OSA (obstructive sleep apnea)   Last Assessment & Plan 08/04/2011 Office Visit Written 08/04/2011  2:43 PM by Lendon Colonel, NP    She has not been using CPAP as directed, and not sleeping well. I have encouraged her to use this. She is to follow-up with her PCP for further treatment.      Acute respiratory failure [facility])     Endocrine   Diabetes mellitus with neuropathy   Last Assessment & Plan 12/08/2012 Office Visit Written 12/08/2012 10:23 AM by Campbell Riches, MD    Being controlled at rehab, will need good control at d/c, home as well.       Diabetic foot ulcer ([place])   Diabetes ([place])     Musculoskeletal and Integument   Open wound of heel   Osteomyelitis of right foot [facility])   Last Assessment & Plan 12/08/2012 Office Visit Written 12/08/2012 10:26 AM by Campbell Riches, MD    Resolved.         Genitourinary   Hematuria, microscopic     Other   Generalized weakness   Restless leg syndrome   Anemia   Tachycardia   Last Assessment & Plan 04/18/2012 Office Visit Written 04/18/2012 12:01 PM by Lendon Colonel, NP    Heart rate is well controlled on metoprolol 100mg  Daily. Will continue this dose for now with refill provided.  I have cautioned her concerning BP. If she continues to lose weight, may need to adjust her dose  to avoid hypotension, leading to falls or fractures.  She is followed by [facility] case manager who also checks her BP.       Hyperlipidemia   Morbid obesity ([place])   Anxiety   S/P bilateral BKA (below knee amputation) [facility])   Last Assessment & Plan 12/08/2012 Office Visit Written 12/08/2012 10:27 AM by Campbell Riches, MD    She is doing well. Her wound is healing. I asked that she call me if she has worsening erythema, wound breakdown, pain. She can stop her anbx now. Will o/w see her back prn.       Candidiasis of breast   Last Assessment & Plan 12/08/2012 Office Visit Written 12/08/2012 10:29 AM by Campbell Riches, MD    Will give her short course of diflucan if no drug interactions. Should improve with stopping anbx.       Hx of right BKA ([place])   Unstable balance   Difficulty in walking(719.7)   Infected wound   Expressive aphasia   Elevated troponin       Imaging: No results found.

## 2016-06-03 NOTE — Progress Notes (Signed)
Cardiology Office Note   Date:  06/03/2016   ID:  Mary [name], DOB 12/13/1945, MRN [id number]  PCP:  Wende Neighbors, MD  Cardiologist:  Cloria Spring, NP   Chief Complaint  Patient presents with  . Hypertension  . PAD      History of Present Illness: Mary [name] is a 71 y.o. female who presents for ongoing assessment and management of diastolic heart failure, hypertension, hyperlipidemia, and chronic dyspnea. He has also had a history of PSVT. On last office visit the patient had multiple somatic complaints, lower extremity edema, breast swelling, dyspnea, and hypertension. She had run out of her Lasix.  On last office visit I stop spironolactone due to breast tenderness. She was started on Lasix 20 mg daily. I've given her low dose potassium replacement 10 mEq daily as it does spironolactone was discontinued. She was given a prescription for TED hose that would need measuring. Blood pressure was not well controlled and therefore lisinopril was increased to 20/25 mg twice a day and she was continued on metoprolol. She was continued on a low-sodium diet and reinforced on this restriction. Her diabetes with not well-controlled by self reporting and she was to follow-up for ongoing management with her primary care physician  Follow-up labs completed on 06/02/2016 revealed sodium 143, potassium 4.5, creatinine 1.18, BUN 31, Glucose 50.   She is here today with complaints of ongoing pain in her lower left leg. She is Place the TED hose on but she states that he continues to be painful to her and she continues to have some swelling. Her breast pain has been eliminated along with her breast swelling. Her blood pressure is much better controlled. She has not been able to connect with her primary care physician Dr. Nevada Crane concerning her diabetes management.  Past Medical History:  Diagnosis Date  . Anemia   . Arthritis    knees  . Cellulitis and abscess of foot 02/12/2012  . CHF  (congestive heart failure) ([place])   . Complication of anesthesia    pt states after her hysterectomy the doctor said she was "wild" when she woke up   . Diabetes mellitus   . Diabetes mellitus with neuropathy 07/25/2011  . Dysrhythmia    tachy- takes Metoprolol  . OSA (obstructive sleep apnea) 07/25/2011   not using CPAP  . Osteomyelitis of right foot ([place]) 02/14/2012  . Partial Achilles tendon tear 02/14/2012  . Restless leg syndrome 07/25/2011  . Wound, open    right foot    Past Surgical History:  Procedure Laterality Date  . ABDOMINAL HYSTERECTOMY    . ACHILLES TENDON REPAIR    . AMPUTATION Right 11/25/2012   Procedure: AMPUTATION BELOW KNEE;  Surgeon: Newt Minion, MD;  Location: [facility];  Service: Orthopedics;  Laterality: Right;  Right Below Knee Amputation  . BELOW KNEE LEG AMPUTATION Right 11/25/2012   Dr Sharol Given  . BREAST SURGERY Left    Lumpectomy non ca  . CATARACT EXTRACTION W/PHACO Right 10/09/2013   Procedure: CATARACT EXTRACTION PHACO AND INTRAOCULAR LENS PLACEMENT (IOC);  Surgeon: Elta Guadeloupe T. Gershon Crane, MD;  Location: [facility];  Service: Ophthalmology;  Laterality: Right;  CDE:9.05  . CATARACT EXTRACTION W/PHACO Left 10/23/2013   Procedure: ATTEMPTED CATARACT EXTRACTION PHACO AND INTRAOCULAR LENS PLACEMENT;  Surgeon: Elta Guadeloupe T. Gershon Crane, MD;  Location: [facility];  Service: Ophthalmology;  Laterality: Left;  CDE:  4.41  . TOE AMPUTATION     partial rt great toe  Current Outpatient Prescriptions  Medication Sig Dispense Refill  . cholecalciferol (VITAMIN D) 1000 UNITS tablet Take 1,000 Units by mouth daily.     . cilostazol (PLETAL) 100 MG tablet Take 100 mg by mouth 2 (two) times daily.     . compounded topicals builder Apply 1 application topically. KET/DIC/BAC/LID/M/GUA    . DULoxetine (CYMBALTA) 60 MG capsule Take 60 mg by mouth every morning.     . ferrous sulfate 325 (65 FE) MG tablet Take 325 mg by mouth 3 (three) times daily with meals.     . furosemide (LASIX) 20 MG tablet  Take 1 tablet (20 mg total) by mouth daily. 90 tablet 3  . gabapentin (NEURONTIN) 800 MG tablet Take 800 mg by mouth 3 (three) times daily.     . insulin lispro protamine-lispro (HUMALOG 50/50 MIX) (50-50) 100 UNIT/ML SUSP injection Inject 0.2 mLs (20 Units total) into the skin 3 (three) times daily. (Patient taking differently: Inject 20-25 Units into the skin 3 (three) times daily. Patient self adjusts amount based on sugar levels) 10 mL 11  . lisinopril-hydrochlorothiazide (PRINZIDE,ZESTORETIC) 20-25 MG tablet Take 1 tablet by mouth daily. 30 tablet 11  . metFORMIN (GLUCOPHAGE) 1000 MG tablet Take 1,000 mg by mouth 2 (two) times daily with a meal.     . metoprolol (LOPRESSOR) 50 MG tablet TAKE (1) TABLET BY MOUTH TWICE DAILY. 60 tablet 0  . Multiple Vitamin (MULTIVITAMIN WITH MINERALS) TABS Take 1 tablet by mouth once a week.     . potassium chloride (K-DUR) 10 MEQ tablet Take 1 tablet (10 mEq total) by mouth daily. 90 tablet 3  . pravastatin (PRAVACHOL) 40 MG tablet Take 40 mg by mouth daily.    Marland Kitchen rOPINIRole (REQUIP) 2 MG tablet Take 2 mg by mouth every evening.     . Tolnaftate (ABSORBINE JR EX) Apply 1 application topically daily as needed (for pain).    . traMADol (ULTRAM) 50 MG tablet Take 1 tablet (50 mg total) by mouth every 6 (six) hours as needed. 15 tablet 0   No current facility-administered medications for this visit.     Allergies:   Patient has no known allergies.    Social History:  The patient  reports that she has never smoked. She has never used smokeless tobacco. She reports that she does not drink alcohol or use drugs.   Family History:  The patient's family history includes Diabetes in her sister.    ROS: All other systems are reviewed and negative. Unless otherwise mentioned in H&P    PHYSICAL EXAM: VS:  BP 136/66   Pulse 94   Ht 5' (1.524 m)   Wt 244 lb (110.7 kg)   LMP 06/01/1983   SpO2 92%   BMI 47.65 kg/m  , BMI Body mass index is 47.65 kg/m. GEN:  Well nourished, well developed, in no acute distress  HEENT: normal  Neck: no JVD, carotid bruits, or masses Cardiac: RRR; no murmurs, rubs, or gallops,no edema  Respiratory:  clear to auscultation bilaterally, normal work of breathing GI: soft, nontender, nondistended, + BS MS: no deformity or atrophy Prosthetic R leg. Diminished pulses on the left. TED hose in place. No significant edema.  Skin: warm and dry, no rash Neuro:  Strength and sensation are intact Psych: euthymic mood, full affect     Recent Labs: 07/19/2015: ALT 14 04/13/2016: Hemoglobin 9.9; Platelets 226 05/12/2016: Brain Natriuretic Peptide 838.8; Magnesium 1.5 06/02/2016: BUN 31; Creat 1.18; Potassium 4.5; Sodium 143  Lipid Panel    Component Value Date/Time   CHOL 149 01/14/2014 0548   TRIG 148 01/14/2014 0548   HDL 50 01/14/2014 0548   CHOLHDL 3.0 01/14/2014 0548   VLDL 30 01/14/2014 0548   LDLCALC 69 01/14/2014 0548      Wt Readings from Last 3 Encounters:  06/03/16 244 lb (110.7 kg)  05/20/16 257 lb (116.6 kg)  05/05/16 261 lb 9.6 oz (118.7 kg)      Other studies Reviewed: Additional studies/ records that were reviewed today include: ABI 2013.  Review of the above records demonstrates: Left ankle 1.0 to 0.99.    ASSESSMENT AND PLAN:  1. Hypertension: Blood pressure is much better controlled today on new medication regimen. She is no longer complaining of swelling in her breasts or pain in her breasts after stopping spironolactone. She will continue lisinopril HCTZ 20/25 mg daily, Lasix 20 mg daily, and potassium replacement.  2. Peripheral arterial disease: She has had a right above-the-knee amputation, and has been complaining of left lower extremity pain and edema. Wearing TED hose has not been helpful to the left leg and she does have diminished pulses. I have reviewed her last ABIs which were completed in 2013. I will repeat unilateral ABI on the left to evaluate for progressive peripheral  arterial disease.  3. Hypercholesterolemia: Continue statin therapy.   4. Diabetes: Follow up appointment with primary care physician, Dr. Nevada Crane, for ongoing management. She was found to be hypoglycemic in the fasting lab work. Defer to them concerning insulin dosing.   Current medicines are reviewed at length with the patient today.    Labs/ tests ordered today include: Unilateral ABI on the left   Orders Placed This Encounter  Procedures  . US Arterial Seg Single     Disposition:   FU with one month to discuss results of ABI.  Signed, Jory Sims, NP  06/03/2016 3:36 PM    [facility] [address], [place], [place] [address] Phone: [phone]; Fax: [phone]

## 2016-06-08 ENCOUNTER — Ambulatory Visit (HOSPITAL_COMMUNITY)
Admission: RE | Admit: 2016-06-08 | Discharge: 2016-06-08 | Disposition: A | Discharge status: Home / Self Care | Payer: PPO | Source: Ambulatory Visit | Attending: Adult Health | Admitting: Adult Health

## 2016-06-08 DIAGNOSIS — M79605 Pain in left leg: Secondary | ICD-10-CM | POA: Diagnosis present

## 2016-07-05 ENCOUNTER — Ambulatory Visit (INDEPENDENT_AMBULATORY_CARE_PROVIDER_SITE_OTHER): Payer: PPO | Admitting: Adult Health

## 2016-07-05 ENCOUNTER — Encounter: Payer: Self-pay | Admitting: Adult Health

## 2016-07-05 VITALS — BP 152/84 | HR 88 | Ht 70.0 in | Wt 236.0 lb

## 2016-07-05 DIAGNOSIS — I5043 Acute on chronic combined systolic (congestive) and diastolic (congestive) heart failure: Secondary | ICD-10-CM | POA: Diagnosis not present

## 2016-07-05 DIAGNOSIS — M79605 Pain in left leg: Secondary | ICD-10-CM

## 2016-07-05 DIAGNOSIS — I10 Essential (primary) hypertension: Secondary | ICD-10-CM

## 2016-07-05 NOTE — Progress Notes (Signed)
Name: Mary [name]    DOB: 1946-05-17  Age: 71 y.o.  MR#: [id number]       PCP:  Wende Neighbors, MD      Insurance: Payor: [employer] Must / Plan: Tenn[employer] Must / Product Type: *No Product type* /   CC:    Chief Complaint  Patient presents with  . Hypertension  . Congestive Heart Failure    VS Vitals:   07/05/16 1324  BP: (!) 152/84  Pulse: 88  SpO2: 94%  Weight: 236 lb (107 kg)  Height: 5\' 10"  (1.778 m)    Weights Current Weight  07/05/16 236 lb (107 kg)  06/03/16 244 lb (110.7 kg)  05/20/16 257 lb (116.6 kg)    Blood Pressure  BP Readings from Last 3 Encounters:  07/05/16 (!) 152/84  06/03/16 136/66  05/20/16 (!) 172/98     Admit date:  (Not on file) Last encounter with RMR:  06/03/2016   Allergy Patient has no known allergies.  Current Outpatient Prescriptions  Medication Sig Dispense Refill  . cholecalciferol (VITAMIN D) 1000 UNITS tablet Take 1,000 Units by mouth daily.     . cilostazol (PLETAL) 100 MG tablet Take 100 mg by mouth 2 (two) times daily.     . compounded topicals builder Apply 1 application topically. KET/DIC/BAC/LID/M/GUA    . DULoxetine (CYMBALTA) 60 MG capsule Take 60 mg by mouth every morning.     . ferrous sulfate 325 (65 FE) MG tablet Take 325 mg by mouth 3 (three) times daily with meals.     . furosemide (LASIX) 20 MG tablet Take 1 tablet (20 mg total) by mouth daily. 90 tablet 3  . gabapentin (NEURONTIN) 800 MG tablet Take 800 mg by mouth 3 (three) times daily.     . insulin lispro protamine-lispro (HUMALOG 50/50 MIX) (50-50) 100 UNIT/ML SUSP injection Inject 0.2 mLs (20 Units total) into the skin 3 (three) times daily. (Patient taking differently: Inject 20-25 Units into the skin 3 (three) times daily. Patient self adjusts amount based on sugar levels) 10 mL 11  . lisinopril-hydrochlorothiazide (PRINZIDE,ZESTORETIC) 20-25 MG tablet Take 1 tablet by mouth daily. 30 tablet 11  . metFORMIN (GLUCOPHAGE) 1000 MG tablet Take 1,000 mg by  mouth 2 (two) times daily with a meal.     . metoprolol (LOPRESSOR) 50 MG tablet TAKE (1) TABLET BY MOUTH TWICE DAILY. 60 tablet 0  . Multiple Vitamin (MULTIVITAMIN WITH MINERALS) TABS Take 1 tablet by mouth once a week.     . potassium chloride (K-DUR) 10 MEQ tablet Take 1 tablet (10 mEq total) by mouth daily. 90 tablet 3  . pravastatin (PRAVACHOL) 40 MG tablet Take 40 mg by mouth daily.    Marland Kitchen rOPINIRole (REQUIP) 2 MG tablet Take 2 mg by mouth every evening.     . Tolnaftate (ABSORBINE JR EX) Apply 1 application topically daily as needed (for pain).    . traMADol (ULTRAM) 50 MG tablet Take 1 tablet (50 mg total) by mouth every 6 (six) hours as needed. 15 tablet 0   No current facility-administered medications for this visit.     Discontinued Meds:   There are no discontinued medications.  Patient Active Problem List   Diagnosis Date Noted  . Elevated troponin 07/19/2015  . Diastolic CHF, acute on chronic (HCC) 01/13/2014  . Expressive aphasia 01/13/2014  . CHF (congestive heart failure) ([place])   . Unstable balance 01/16/2013  . Difficulty in walking(719.7) 01/16/2013  . Infected wound 01/16/2013  . Hx  of right BKA ([place]) 12/23/2012  . Candidiasis of breast 12/08/2012  . S/P bilateral BKA (below knee amputation) ([place]) 11/30/2012  . Diabetes ([place]) 11/30/2012  . Diabetic foot ulcer ([place]) 10/08/2012  . Anxiety 02/17/2012  . Osteomyelitis of right foot ([place]) 02/14/2012  . Hematuria, microscopic 02/13/2012  . PVD (peripheral vascular disease) ([place]) 02/08/2012  . Open wound of heel 02/08/2012  . Hyperlipidemia 11/11/2011  . Hypertension 11/11/2011  . Morbid obesity ([place]) 11/11/2011  . Tachycardia 08/04/2011  . Acute on chronic diastolic heart failure ([place]) 07/25/2011  . Diabetes mellitus with neuropathy 07/25/2011  . OSA (obstructive sleep apnea) 07/25/2011  . Generalized weakness 07/25/2011  . Acute respiratory failure ([place]) 07/25/2011  . Restless leg syndrome 07/25/2011  . Anemia  07/25/2011    LABS    Component Value Date/Time   NA 143 06/02/2016 0746   NA 140 05/12/2016 0847   NA 137 04/13/2016 1736   K 4.5 06/02/2016 0746   K 4.4 05/12/2016 0847   K 4.0 04/13/2016 1736   CL 105 06/02/2016 0746   CL 105 05/12/2016 0847   CL 106 04/13/2016 1736   CO2 28 06/02/2016 0746   CO2 29 05/12/2016 0847   CO2 26 04/13/2016 1736   GLUCOSE 57 (L) 06/02/2016 0746   GLUCOSE 89 05/12/2016 0847   GLUCOSE 183 (H) 04/13/2016 1736   BUN 31 (H) 06/02/2016 0746   BUN 27 (H) 05/12/2016 0847   BUN 25 (H) 04/13/2016 1736   CREATININE 1.18 (H) 06/02/2016 0746   CREATININE 1.25 (H) 05/12/2016 0847   CREATININE 1.18 (H) 04/13/2016 1736   CREATININE 1.68 (H) 07/22/2015 0559   CREATININE 1.55 (H) 07/21/2015 0645   CALCIUM 8.5 (L) 06/02/2016 0746   CALCIUM 7.8 (L) 05/12/2016 0847   CALCIUM 7.7 (L) 04/13/2016 1736   GFRNONAA 46 (L) 04/13/2016 1736   GFRNONAA 30 (L) 07/22/2015 0559   GFRNONAA 33 (L) 07/21/2015 0645   GFRAA 53 (L) 04/13/2016 1736   GFRAA 35 (L) 07/22/2015 0559   GFRAA 38 (L) 07/21/2015 0645   CMP     Component Value Date/Time   NA 143 06/02/2016 0746   K 4.5 06/02/2016 0746   CL 105 06/02/2016 0746   CO2 28 06/02/2016 0746   GLUCOSE 57 (L) 06/02/2016 0746   BUN 31 (H) 06/02/2016 0746   CREATININE 1.18 (H) 06/02/2016 0746   CALCIUM 8.5 (L) 06/02/2016 0746   PROT 6.4 (L) 07/19/2015 1325   ALBUMIN 3.0 (L) 07/19/2015 1325   AST 20 07/19/2015 1325   ALT 14 07/19/2015 1325   ALKPHOS 64 07/19/2015 1325   BILITOT 0.7 07/19/2015 1325   GFRNONAA 46 (L) 04/13/2016 1736   GFRAA 53 (L) 04/13/2016 1736       Component Value Date/Time   WBC 8.9 04/13/2016 1736   WBC 6.3 07/22/2015 0559   WBC 8.8 07/21/2015 0645   HGB 9.9 (L) 04/13/2016 1736   HGB 8.4 (L) 07/22/2015 0559   HGB 8.6 (L) 07/21/2015 0645   HCT 32.1 (L) 04/13/2016 1736   HCT 26.9 (L) 07/22/2015 0559   HCT 27.6 (L) 07/21/2015 0645   MCV 88.2 04/13/2016 1736   MCV 85.7 07/22/2015 0559   MCV  85.7 07/21/2015 0645    Lipid Panel     Component Value Date/Time   CHOL 149 01/14/2014 0548   TRIG 148 01/14/2014 0548   HDL 50 01/14/2014 0548   CHOLHDL 3.0 01/14/2014 0548   VLDL 30 01/14/2014 0548   LDLCALC 69 01/14/2014  0548    ABG    Component Value Date/Time   PHART 7.401 01/13/2014 0830   PCO2ART 42.2 01/13/2014 0830   PO2ART 58.4 (L) 01/13/2014 0830   HCO3 25.7 (H) 01/13/2014 0830   TCO2 22.9 01/13/2014 0830   O2SAT 89.3 01/13/2014 0830     Lab Results  Component Value Date   TSH 2.660 01/13/2014   BNP (last 3 results)  Recent Labs  07/19/15 1325 05/12/16 0847  BNP 1,612.0* 838.8*    ProBNP (last 3 results) No results for input(s): PROBNP in the last 8760 hours.  Cardiac Panel (last 3 results) No results for input(s): CKTOTAL, CKMB, TROPONINI, RELINDX in the last 72 hours.  Iron/TIBC/Ferritin/ %Sat    Component Value Date/Time   IRON 11 (L) 02/13/2012 0800   TIBC 183 (L) 02/13/2012 0800   FERRITIN 334 (H) 02/13/2012 0800   IRONPCTSAT 6 (L) 02/13/2012 0800     EKG Orders placed or performed during the hospital encounter of 04/13/16  . EKG 12-Lead  . EKG 12-Lead  . EKG     Prior Assessment and Plan Problem List as of 07/05/2016 Reviewed: 06/03/2016  3:35 PM by Jory Sims, NP     Cardiovascular and Mediastinum   Acute on chronic diastolic heart failure [facility])   Last Assessment & Plan 08/04/2011 Office Visit Written 08/04/2011  2:44 PM by Lendon Colonel, NP    No evidence of fluid retention at this time. Low salt diet is provided for her review      Hypertension   Last Assessment & Plan 04/18/2012 Office Visit Written 04/18/2012 12:03 PM by Lendon Colonel, NP    Blood pressure is low normal. Dropping from 159./82 on 02/08/2012 to current level of 110/54 with 20 lb wt loss.. Will continue to monitor this as she is only on metoprolol as discussed above. Dosage adjustment is recommended for BP < 601 systolic.  Will follow.      PVD  (peripheral vascular disease) (HCC)   CHF (congestive heart failure) (HCC)   Diastolic CHF, acute on chronic (HCC)     Respiratory   OSA (obstructive sleep apnea)   Last Assessment & Plan 08/04/2011 Office Visit Written 08/04/2011  2:43 PM by Lendon Colonel, NP    She has not been using CPAP as directed, and not sleeping well. I have encouraged her to use this. She is to follow-up with her PCP for further treatment.      Acute respiratory failure [facility])     Endocrine   Diabetes mellitus with neuropathy   Last Assessment & Plan 12/08/2012 Office Visit Written 12/08/2012 10:23 AM by Campbell Riches, MD    Being controlled at rehab, will need good control at d/c, home as well.       Diabetic foot ulcer ([place])   Diabetes ([place])     Musculoskeletal and Integument   Open wound of heel   Osteomyelitis of right foot [facility])   Last Assessment & Plan 12/08/2012 Office Visit Written 12/08/2012 10:26 AM by Campbell Riches, MD    Resolved.         Genitourinary   Hematuria, microscopic     Other   Generalized weakness   Restless leg syndrome   Anemia   Tachycardia   Last Assessment & Plan 04/18/2012 Office Visit Written 04/18/2012 12:01 PM by Lendon Colonel, NP    Heart rate is well controlled on metoprolol 100mg  Daily. Will continue this dose for now with refill provided.  I have cautioned her concerning BP. If she continues to lose weight, may need to adjust her dose to avoid hypotension, leading to falls or fractures.  She is followed by [facility] case manager who also checks her BP.       Hyperlipidemia   Morbid obesity ([place])   Anxiety   S/P bilateral BKA (below knee amputation) [facility])   Last Assessment & Plan 12/08/2012 Office Visit Written 12/08/2012 10:27 AM by Campbell Riches, MD    She is doing well. Her wound is healing. I asked that she call me if she has worsening erythema, wound breakdown, pain. She can stop her anbx now. Will o/w see her back prn.       Candidiasis of breast    Last Assessment & Plan 12/08/2012 Office Visit Written 12/08/2012 10:29 AM by Campbell Riches, MD    Will give her short course of diflucan if no drug interactions. Should improve with stopping anbx.       Hx of right BKA ([place])   Unstable balance   Difficulty in walking(719.7)   Infected wound   Expressive aphasia   Elevated troponin       Imaging: US Arterial Seg Single  Result Date: 06/08/2016 CLINICAL DATA:  71 year old female with a history of swollen left leg. Prior right amputation Cardiovascular risk factors include known vascular surgery, diabetes EXAM: NONINVASIVE PHYSIOLOGIC VASCULAR STUDY OF BILATERAL LOWER EXTREMITIES TECHNIQUE: Evaluation of both lower extremities was performed at rest, including calculation of ankle-brachial indices, multiple segmental pressure evaluation, segmental Doppler and segmental pulse volume recording. COMPARISON:  None. FINDINGS: Right: Previous amputation. Left: Resting ankle brachial index: 1.28 Segmental blood pressure: Symmetric upper extremity pressures Doppler: Segment Doppler of the distal left lower extremity demonstrates monophasic tibial artery and dorsalis pedis. IMPRESSION: Left: Although the resting ankle-brachial index within normal limits, this is felt to represent noncompressible vasculature, as the Doppler signal demonstrates abnormal monophasic waveforms of the ankle arteries, indicating more proximal disease. Right: Previous amputation. Signed, Dulcy Fanny. Earleen Newport, DO [facility] Radiology Electronically Signed   By: Corrie Mckusick D.O.   On: 06/08/2016 11:02

## 2016-07-05 NOTE — Patient Instructions (Signed)
Your physician wants you to follow-up in: 6 Months with Dr. Branch. You will receive a reminder letter in the mail two months in advance. If you don't receive a letter, please call our office to schedule the follow-up appointment.  Your physician recommends that you continue on your current medications as directed. Please refer to the Current Medication list given to you today.  If you need a refill on your cardiac medications before your next appointment, please call your pharmacy.  Thank you for choosing [facility]!   

## 2016-07-05 NOTE — Progress Notes (Signed)
Cardiology Office Note   Date:  07/05/2016   ID:  Mary [name], Mary [name] 06-Aug-1945, MRN [id number]  PCP:  Wende Neighbors, MD  Cardiologist: Cloria Spring, NP   Chief Complaint  Patient presents with  . Hypertension  . Congestive Heart Failure   History of Present Illness: Mary [name] is a 71 y.o. female who presents for ongoing assistance in management of diastolic heart failure, hypertension, hyperlipidemia, with history of chronic dyspnea and prior history of PSVT. The patient was last seen in the office on 06/03/2016 with complaints of pain in her lower extremity, with other multiple somatic complaints. She also complained of lower extremity edema which was chronic for her and diabetic neuralgia type pain as well. She did taken off of spironolactone due to complaints of breast pain and swelling. Medications were adjusted.   I ordered ABIs to evaluate for peripheral arterial disease with known history of PAD and right above-the-knee amputation. She was continued on all other medications and asked to follow up with primary care physician for ongoing diabetic management. Resting ankle brachial indices were within normal limits. Index was 1.28. However, Doppler signal demonstrated abnormal monophasic waveforms of the ankle arteries indicating more proximal disease.  Primary care physician, Dr. Nevada Crane, ordered an MRI. At the time of this office visit it has not been completed.  She has no cardiac complaints today. She continues to have issues with her legs. She will need to follow-up with Dr. Nevada Crane concerning any further testing will referrals.  Past Medical History:  Diagnosis Date  . Anemia   . Arthritis    knees  . Cellulitis and abscess of foot 02/12/2012  . CHF (congestive heart failure) ([place])   . Complication of anesthesia    pt states after her hysterectomy the doctor said she was "wild" when she woke up   . Diabetes mellitus   . Diabetes mellitus with neuropathy  07/25/2011  . Dysrhythmia    tachy- takes Metoprolol  . OSA (obstructive sleep apnea) 07/25/2011   not using CPAP  . Osteomyelitis of right foot ([place]) 02/14/2012  . Partial Achilles tendon tear 02/14/2012  . Restless leg syndrome 07/25/2011  . Wound, open    right foot    Past Surgical History:  Procedure Laterality Date  . ABDOMINAL HYSTERECTOMY    . ACHILLES TENDON REPAIR    . AMPUTATION Right 11/25/2012   Procedure: AMPUTATION BELOW KNEE;  Surgeon: Newt Minion, MD;  Location: [facility];  Service: Orthopedics;  Laterality: Right;  Right Below Knee Amputation  . BELOW KNEE LEG AMPUTATION Right 11/25/2012   Dr Sharol Given  . BREAST SURGERY Left    Lumpectomy non ca  . CATARACT EXTRACTION W/PHACO Right 10/09/2013   Procedure: CATARACT EXTRACTION PHACO AND INTRAOCULAR LENS PLACEMENT (IOC);  Surgeon: Elta Guadeloupe T. Gershon Crane, MD;  Location: [facility];  Service: Ophthalmology;  Laterality: Right;  CDE:9.05  . CATARACT EXTRACTION W/PHACO Left 10/23/2013   Procedure: ATTEMPTED CATARACT EXTRACTION PHACO AND INTRAOCULAR LENS PLACEMENT;  Surgeon: Elta Guadeloupe T. Gershon Crane, MD;  Location: [facility];  Service: Ophthalmology;  Laterality: Left;  CDE:  4.41  . TOE AMPUTATION     partial rt great toe     Current Outpatient Prescriptions  Medication Sig Dispense Refill  . cholecalciferol (VITAMIN D) 1000 UNITS tablet Take 1,000 Units by mouth daily.     . cilostazol (PLETAL) 100 MG tablet Take 100 mg by mouth 2 (two) times daily.     . compounded topicals builder  Apply 1 application topically. KET/DIC/BAC/LID/M/GUA    . DULoxetine (CYMBALTA) 60 MG capsule Take 60 mg by mouth every morning.     . ferrous sulfate 325 (65 FE) MG tablet Take 325 mg by mouth 3 (three) times daily with meals.     . furosemide (LASIX) 20 MG tablet Take 1 tablet (20 mg total) by mouth daily. 90 tablet 3  . gabapentin (NEURONTIN) 800 MG tablet Take 800 mg by mouth 3 (three) times daily.     . insulin lispro protamine-lispro (HUMALOG 50/50 MIX) (50-50) 100  UNIT/ML SUSP injection Inject 0.2 mLs (20 Units total) into the skin 3 (three) times daily. (Patient taking differently: Inject 20-25 Units into the skin 3 (three) times daily. Patient self adjusts amount based on sugar levels) 10 mL 11  . lisinopril-hydrochlorothiazide (PRINZIDE,ZESTORETIC) 20-25 MG tablet Take 1 tablet by mouth daily. 30 tablet 11  . metFORMIN (GLUCOPHAGE) 1000 MG tablet Take 1,000 mg by mouth 2 (two) times daily with a meal.     . metoprolol (LOPRESSOR) 50 MG tablet TAKE (1) TABLET BY MOUTH TWICE DAILY. 60 tablet 0  . Multiple Vitamin (MULTIVITAMIN WITH MINERALS) TABS Take 1 tablet by mouth once a week.     . potassium chloride (K-DUR) 10 MEQ tablet Take 1 tablet (10 mEq total) by mouth daily. 90 tablet 3  . pravastatin (PRAVACHOL) 40 MG tablet Take 40 mg by mouth daily.    Marland Kitchen rOPINIRole (REQUIP) 2 MG tablet Take 2 mg by mouth every evening.     . Tolnaftate (ABSORBINE JR EX) Apply 1 application topically daily as needed (for pain).    . traMADol (ULTRAM) 50 MG tablet Take 1 tablet (50 mg total) by mouth every 6 (six) hours as needed. 15 tablet 0   No current facility-administered medications for this visit.     Allergies:   Patient has no known allergies.    Social History:  The patient  reports that she has never smoked. She has never used smokeless tobacco. She reports that she does not drink alcohol or use drugs.   Family History:  The patient's family history includes Diabetes in her sister.    ROS: All other systems are reviewed and negative. Unless otherwise mentioned in H&P    PHYSICAL EXAM: VS:  BP (!) 152/84   Pulse 88   Ht 5\' 10"  (1.778 m)   Wt 236 lb (107 kg)   LMP 07/25/2011   SpO2 94%   BMI 33.86 kg/m  , BMI Body mass index is 33.86 kg/m. GEN: Well nourished, well developed, in no acute distress  HEENT: normal  Neck: no JVD, carotid bruits, or masses Cardiac: RRR; no murmurs, rubs, or gallops,no edema  Respiratory:  clear to auscultation  bilaterally, normal work of breathing GI: soft, nontender, nondistended, + BS MS: no deformity or atrophy Prosthetic right leg. Some deformity in the left ankle and foot.  Skin: warm and dry, no rash Neuro:  Strength and sensation are intact Psych: euthymic mood, full affect   Recent Labs: 07/19/2015: ALT 14 04/13/2016: Hemoglobin 9.9; Platelets 226 05/12/2016: Brain Natriuretic Peptide 838.8; Magnesium 1.5 06/02/2016: BUN 31; Creat 1.18; Potassium 4.5; Sodium 143    Lipid Panel    Component Value Date/Time   CHOL 149 01/14/2014 0548   TRIG 148 01/14/2014 0548   HDL 50 01/14/2014 0548   CHOLHDL 3.0 01/14/2014 0548   VLDL 30 01/14/2014 0548   LDLCALC 69 01/14/2014 0548      Wt Readings from  Last 3 Encounters:  07/05/16 236 lb (107 kg)  06/03/16 244 lb (110.7 kg)  05/20/16 257 lb (116.6 kg)      ASSESSMENT AND PLAN:  1.  Chronic diastolic heart failure: Patient does not appear to be fluid overloaded. She does have some chronic woody edema in her left leg. Her weight is down and she is also trying to lose weight. She has lost approximately 20 pounds since December. She is eating right and watching carbs as she is also diabetic. She will continue daily Lasix.  2. Hypertension: Slightly elevated today. Recheck reveals normalization. No new testing or labs at this visit.  3. Hypercholesterolemia: Patient will continue on pravastatin therapy. Labs are per primary care  4. Chronic leg pain: Defer back to primary care. May be diabetic neuralgia versus musculoskeletal. May need referral to have MRI completed as this was not done in the past.   Current medicines are reviewed at length with the patient today.    Labs/ tests ordered today include:  No orders of the defined types were placed in this encounter.    Disposition:   FU with 6 months Signed, Jory Sims, NP  07/05/2016 1:48 PM    [facility] [address] [place], [place]  [address] Phone: [phone]; Fax: [phone]

## 2016-07-06 ENCOUNTER — Other Ambulatory Visit (HOSPITAL_COMMUNITY): Payer: Self-pay | Admitting: Internal Medicine

## 2016-07-06 DIAGNOSIS — Z1231 Encounter for screening mammogram for malignant neoplasm of breast: Secondary | ICD-10-CM

## 2016-07-12 ENCOUNTER — Ambulatory Visit (HOSPITAL_COMMUNITY): Payer: Self-pay

## 2016-07-15 ENCOUNTER — Ambulatory Visit (HOSPITAL_COMMUNITY)
Admission: RE | Admit: 2016-07-15 | Discharge: 2016-07-15 | Disposition: A | Discharge status: Home / Self Care | Payer: PPO | Source: Ambulatory Visit | Attending: Internal Medicine | Admitting: Internal Medicine

## 2016-07-15 DIAGNOSIS — Z1231 Encounter for screening mammogram for malignant neoplasm of breast: Secondary | ICD-10-CM | POA: Insufficient documentation

## 2016-07-21 ENCOUNTER — Other Ambulatory Visit (HOSPITAL_COMMUNITY): Payer: Self-pay | Admitting: Internal Medicine

## 2016-07-21 DIAGNOSIS — R928 Other abnormal and inconclusive findings on diagnostic imaging of breast: Secondary | ICD-10-CM

## 2016-08-03 ENCOUNTER — Encounter (HOSPITAL_COMMUNITY): Payer: Self-pay

## 2016-08-03 ENCOUNTER — Ambulatory Visit (HOSPITAL_COMMUNITY)
Admission: RE | Admit: 2016-08-03 | Discharge: 2016-08-03 | Disposition: A | Discharge status: Home / Self Care | Payer: PPO | Source: Ambulatory Visit | Attending: Internal Medicine | Admitting: Internal Medicine

## 2016-08-03 DIAGNOSIS — R928 Other abnormal and inconclusive findings on diagnostic imaging of breast: Secondary | ICD-10-CM | POA: Insufficient documentation

## 2016-11-28 ENCOUNTER — Emergency Department (HOSPITAL_COMMUNITY): Payer: PPO

## 2016-11-28 ENCOUNTER — Encounter (HOSPITAL_COMMUNITY): Payer: Self-pay | Admitting: Emergency Medicine

## 2016-11-28 ENCOUNTER — Inpatient Hospital Stay (HOSPITAL_COMMUNITY)
Admission: EM | Admit: 2016-11-28 | Discharge: 2016-12-06 | DRG: 291 | Disposition: A | Discharge status: Skilled Nursing Facility | Payer: PPO | Attending: Internal Medicine | Admitting: Internal Medicine

## 2016-11-28 DIAGNOSIS — Z89511 Acquired absence of right leg below knee: Secondary | ICD-10-CM

## 2016-11-28 DIAGNOSIS — Z9119 Patient's noncompliance with other medical treatment and regimen: Secondary | ICD-10-CM

## 2016-11-28 DIAGNOSIS — S4992XA Unspecified injury of left shoulder and upper arm, initial encounter: Secondary | ICD-10-CM | POA: Diagnosis present

## 2016-11-28 DIAGNOSIS — W010XXA Fall on same level from slipping, tripping and stumbling without subsequent striking against object, initial encounter: Secondary | ICD-10-CM | POA: Diagnosis present

## 2016-11-28 DIAGNOSIS — N183 Chronic kidney disease, stage 3 (moderate): Secondary | ICD-10-CM | POA: Diagnosis present

## 2016-11-28 DIAGNOSIS — E782 Mixed hyperlipidemia: Secondary | ICD-10-CM | POA: Diagnosis not present

## 2016-11-28 DIAGNOSIS — I519 Heart disease, unspecified: Secondary | ICD-10-CM | POA: Diagnosis not present

## 2016-11-28 DIAGNOSIS — Z6841 Body Mass Index (BMI) 40.0 and over, adult: Secondary | ICD-10-CM | POA: Diagnosis not present

## 2016-11-28 DIAGNOSIS — I5043 Acute on chronic combined systolic (congestive) and diastolic (congestive) heart failure: Secondary | ICD-10-CM | POA: Diagnosis present

## 2016-11-28 DIAGNOSIS — E1140 Type 2 diabetes mellitus with diabetic neuropathy, unspecified: Secondary | ICD-10-CM | POA: Diagnosis present

## 2016-11-28 DIAGNOSIS — Z9842 Cataract extraction status, left eye: Secondary | ICD-10-CM | POA: Diagnosis not present

## 2016-11-28 DIAGNOSIS — Z79891 Long term (current) use of opiate analgesic: Secondary | ICD-10-CM | POA: Diagnosis not present

## 2016-11-28 DIAGNOSIS — N390 Urinary tract infection, site not specified: Secondary | ICD-10-CM | POA: Diagnosis present

## 2016-11-28 DIAGNOSIS — R0602 Shortness of breath: Secondary | ICD-10-CM | POA: Diagnosis not present

## 2016-11-28 DIAGNOSIS — B9620 Unspecified Escherichia coli [E. coli] as the cause of diseases classified elsewhere: Secondary | ICD-10-CM | POA: Diagnosis present

## 2016-11-28 DIAGNOSIS — Z961 Presence of intraocular lens: Secondary | ICD-10-CM | POA: Diagnosis present

## 2016-11-28 DIAGNOSIS — B3789 Other sites of candidiasis: Secondary | ICD-10-CM | POA: Diagnosis present

## 2016-11-28 DIAGNOSIS — I429 Cardiomyopathy, unspecified: Secondary | ICD-10-CM

## 2016-11-28 DIAGNOSIS — I130 Hypertensive heart and chronic kidney disease with heart failure and stage 1 through stage 4 chronic kidney disease, or unspecified chronic kidney disease: Secondary | ICD-10-CM | POA: Diagnosis present

## 2016-11-28 DIAGNOSIS — Y92009 Unspecified place in unspecified non-institutional (private) residence as the place of occurrence of the external cause: Secondary | ICD-10-CM

## 2016-11-28 DIAGNOSIS — I10 Essential (primary) hypertension: Secondary | ICD-10-CM | POA: Diagnosis not present

## 2016-11-28 DIAGNOSIS — I360 Nonrheumatic tricuspid (valve) stenosis: Secondary | ICD-10-CM | POA: Diagnosis not present

## 2016-11-28 DIAGNOSIS — N184 Chronic kidney disease, stage 4 (severe): Secondary | ICD-10-CM | POA: Diagnosis present

## 2016-11-28 DIAGNOSIS — G4733 Obstructive sleep apnea (adult) (pediatric): Secondary | ICD-10-CM | POA: Diagnosis present

## 2016-11-28 DIAGNOSIS — I5033 Acute on chronic diastolic (congestive) heart failure: Secondary | ICD-10-CM

## 2016-11-28 DIAGNOSIS — M170 Bilateral primary osteoarthritis of knee: Secondary | ICD-10-CM | POA: Diagnosis present

## 2016-11-28 DIAGNOSIS — E0801 Diabetes mellitus due to underlying condition with hyperosmolarity with coma: Secondary | ICD-10-CM

## 2016-11-28 DIAGNOSIS — Z79899 Other long term (current) drug therapy: Secondary | ICD-10-CM | POA: Diagnosis not present

## 2016-11-28 DIAGNOSIS — D638 Anemia in other chronic diseases classified elsewhere: Secondary | ICD-10-CM | POA: Diagnosis present

## 2016-11-28 DIAGNOSIS — Z9841 Cataract extraction status, right eye: Secondary | ICD-10-CM | POA: Diagnosis not present

## 2016-11-28 DIAGNOSIS — E1122 Type 2 diabetes mellitus with diabetic chronic kidney disease: Secondary | ICD-10-CM | POA: Diagnosis present

## 2016-11-28 DIAGNOSIS — Z9114 Patient's other noncompliance with medication regimen: Secondary | ICD-10-CM | POA: Diagnosis not present

## 2016-11-28 DIAGNOSIS — E8809 Other disorders of plasma-protein metabolism, not elsewhere classified: Secondary | ICD-10-CM | POA: Diagnosis not present

## 2016-11-28 DIAGNOSIS — E1121 Type 2 diabetes mellitus with diabetic nephropathy: Secondary | ICD-10-CM | POA: Diagnosis present

## 2016-11-28 DIAGNOSIS — R0902 Hypoxemia: Secondary | ICD-10-CM | POA: Diagnosis present

## 2016-11-28 DIAGNOSIS — W19XXXA Unspecified fall, initial encounter: Secondary | ICD-10-CM | POA: Diagnosis present

## 2016-11-28 DIAGNOSIS — G2581 Restless legs syndrome: Secondary | ICD-10-CM | POA: Diagnosis present

## 2016-11-28 DIAGNOSIS — E785 Hyperlipidemia, unspecified: Secondary | ICD-10-CM | POA: Diagnosis present

## 2016-11-28 DIAGNOSIS — E875 Hyperkalemia: Secondary | ICD-10-CM | POA: Diagnosis not present

## 2016-11-28 DIAGNOSIS — Z794 Long term (current) use of insulin: Secondary | ICD-10-CM

## 2016-11-28 DIAGNOSIS — E119 Type 2 diabetes mellitus without complications: Secondary | ICD-10-CM

## 2016-11-28 DIAGNOSIS — R296 Repeated falls: Secondary | ICD-10-CM | POA: Diagnosis present

## 2016-11-28 DIAGNOSIS — Z90710 Acquired absence of both cervix and uterus: Secondary | ICD-10-CM

## 2016-11-28 DIAGNOSIS — I509 Heart failure, unspecified: Secondary | ICD-10-CM

## 2016-11-28 DIAGNOSIS — Z9181 History of falling: Secondary | ICD-10-CM

## 2016-11-28 DIAGNOSIS — I313 Pericardial effusion (noninflammatory): Secondary | ICD-10-CM | POA: Diagnosis present

## 2016-11-28 DIAGNOSIS — Z833 Family history of diabetes mellitus: Secondary | ICD-10-CM | POA: Diagnosis not present

## 2016-11-28 DIAGNOSIS — R062 Wheezing: Secondary | ICD-10-CM

## 2016-11-28 LAB — COMPREHENSIVE METABOLIC PANEL
ALK PHOS: 85 U/L (ref 38–126)
ALT: 16 U/L (ref 14–54)
ANION GAP: 7 (ref 5–15)
AST: 20 U/L (ref 15–41)
Albumin: 2.2 g/dL — ABNORMAL LOW (ref 3.5–5.0)
BUN: 29 mg/dL — ABNORMAL HIGH (ref 6–20)
CALCIUM: 7.7 mg/dL — AB (ref 8.9–10.3)
CO2: 27 mmol/L (ref 22–32)
Chloride: 111 mmol/L (ref 101–111)
Creatinine, Ser: 1.47 mg/dL — ABNORMAL HIGH (ref 0.44–1.00)
GFR calc non Af Amer: 35 mL/min — ABNORMAL LOW (ref >60)
GFR, EST AFRICAN AMERICAN: 41 mL/min — AB (ref >60)
Glucose, Bld: 114 mg/dL — ABNORMAL HIGH (ref 65–99)
Potassium: 3.9 mmol/L (ref 3.5–5.1)
SODIUM: 145 mmol/L (ref 135–145)
Total Bilirubin: 0.6 mg/dL (ref 0.3–1.2)
Total Protein: 5.8 g/dL — ABNORMAL LOW (ref 6.5–8.1)

## 2016-11-28 LAB — BRAIN NATRIURETIC PEPTIDE: B NATRIURETIC PEPTIDE 5: 1485 pg/mL — AB (ref 0.0–100.0)

## 2016-11-28 LAB — CBC WITH DIFFERENTIAL/PLATELET
BASOS ABS: 0 10*3/uL (ref 0.0–0.1)
BASOS PCT: 0 %
Eosinophils Absolute: 0.3 10*3/uL (ref 0.0–0.7)
Eosinophils Relative: 5 %
HEMATOCRIT: 30.9 % — AB (ref 36.0–46.0)
HEMOGLOBIN: 9.7 g/dL — AB (ref 12.0–15.0)
Lymphocytes Relative: 15 %
Lymphs Abs: 1 10*3/uL (ref 0.7–4.0)
MCH: 27.4 pg (ref 26.0–34.0)
MCHC: 31.4 g/dL (ref 30.0–36.0)
MCV: 87.3 fL (ref 78.0–100.0)
MONOS PCT: 12 %
Monocytes Absolute: 0.8 10*3/uL (ref 0.1–1.0)
NEUTROS ABS: 4.6 10*3/uL (ref 1.7–7.7)
NEUTROS PCT: 68 %
Platelets: 266 10*3/uL (ref 150–400)
RBC: 3.54 MIL/uL — AB (ref 3.87–5.11)
RDW: 16.1 % — ABNORMAL HIGH (ref 11.5–15.5)
WBC: 6.7 10*3/uL (ref 4.0–10.5)

## 2016-11-28 LAB — MAGNESIUM: Magnesium: 1.5 mg/dL — ABNORMAL LOW (ref 1.7–2.4)

## 2016-11-28 LAB — GLUCOSE, CAPILLARY: GLUCOSE-CAPILLARY: 383 mg/dL — AB (ref 65–99)

## 2016-11-28 LAB — TROPONIN I: Troponin I: 0.03 ng/mL (ref <0.03)

## 2016-11-28 MED ORDER — POTASSIUM CHLORIDE CRYS ER 20 MEQ PO TBCR
40.0000 meq | EXTENDED_RELEASE_TABLET | Freq: Once | ORAL | Status: AC
  Administered 2016-11-28: 40 meq via ORAL
  Filled 2016-11-28: qty 2

## 2016-11-28 MED ORDER — ACETAMINOPHEN 325 MG PO TABS
650.0000 mg | ORAL_TABLET | ORAL | Status: DC | PRN
  Administered 2016-12-04 – 2016-12-06 (×4): 650 mg via ORAL
  Filled 2016-11-28 (×4): qty 2

## 2016-11-28 MED ORDER — CILOSTAZOL 100 MG PO TABS
100.0000 mg | ORAL_TABLET | Freq: Two times a day (BID) | ORAL | Status: DC
  Administered 2016-11-28 – 2016-12-06 (×15): 100 mg via ORAL
  Filled 2016-11-28 (×20): qty 1

## 2016-11-28 MED ORDER — GABAPENTIN 800 MG PO TABS
800.0000 mg | ORAL_TABLET | Freq: Three times a day (TID) | ORAL | Status: DC
  Administered 2016-11-28: 800 mg via ORAL
  Filled 2016-11-28 (×3): qty 1

## 2016-11-28 MED ORDER — GABAPENTIN 300 MG PO CAPS
ORAL_CAPSULE | ORAL | Status: AC
  Filled 2016-11-28: qty 2

## 2016-11-28 MED ORDER — FLUCONAZOLE 100 MG PO TABS
100.0000 mg | ORAL_TABLET | Freq: Every day | ORAL | Status: DC
  Administered 2016-11-28 – 2016-12-04 (×7): 100 mg via ORAL
  Filled 2016-11-28 (×7): qty 1

## 2016-11-28 MED ORDER — GABAPENTIN 100 MG PO CAPS
ORAL_CAPSULE | ORAL | Status: AC
  Filled 2016-11-28: qty 2

## 2016-11-28 MED ORDER — SODIUM CHLORIDE 0.9 % IV SOLN: 250.0000 mL | INTRAVENOUS | Status: DC | PRN

## 2016-11-28 MED ORDER — CILOSTAZOL 100 MG PO TABS
ORAL_TABLET | ORAL | Status: AC
  Filled 2016-11-28: qty 1

## 2016-11-28 MED ORDER — FUROSEMIDE 10 MG/ML IJ SOLN
40.0000 mg | Freq: Once | INTRAMUSCULAR | Status: AC
  Administered 2016-11-28: 40 mg via INTRAVENOUS
  Filled 2016-11-28: qty 4

## 2016-11-28 MED ORDER — ONDANSETRON HCL 4 MG/2ML IJ SOLN: 4.0000 mg | Freq: Four times a day (QID) | INTRAMUSCULAR | Status: DC | PRN

## 2016-11-28 MED ORDER — PRAVASTATIN SODIUM 40 MG PO TABS
40.0000 mg | ORAL_TABLET | Freq: Every day | ORAL | Status: DC
  Administered 2016-11-28 – 2016-12-06 (×9): 40 mg via ORAL
  Filled 2016-11-28 (×9): qty 1

## 2016-11-28 MED ORDER — NYSTATIN 100000 UNIT/GM EX POWD
Freq: Three times a day (TID) | CUTANEOUS | Status: DC
  Administered 2016-11-28 – 2016-12-05 (×20): via TOPICAL
  Filled 2016-11-28 (×3): qty 15

## 2016-11-28 MED ORDER — DULOXETINE HCL 60 MG PO CPEP
60.0000 mg | ORAL_CAPSULE | Freq: Every morning | ORAL | Status: DC
  Administered 2016-11-29 – 2016-12-03 (×5): 60 mg via ORAL
  Filled 2016-11-28 (×6): qty 1

## 2016-11-28 MED ORDER — SODIUM CHLORIDE 0.9% FLUSH
3.0000 mL | Freq: Two times a day (BID) | INTRAVENOUS | Status: DC
  Administered 2016-11-28 – 2016-12-06 (×16): 3 mL via INTRAVENOUS

## 2016-11-28 MED ORDER — INSULIN ASPART PROT & ASPART (70-30 MIX) 100 UNIT/ML ~~LOC~~ SUSP
20.0000 [IU] | Freq: Three times a day (TID) | SUBCUTANEOUS | Status: DC
  Administered 2016-11-29 (×2): 20 [IU] via SUBCUTANEOUS
  Filled 2016-11-28: qty 10

## 2016-11-28 MED ORDER — ROPINIROLE HCL 1 MG PO TABS
2.0000 mg | ORAL_TABLET | Freq: Every evening | ORAL | Status: DC
  Administered 2016-11-28 – 2016-12-04 (×7): 2 mg via ORAL
  Filled 2016-11-28 (×7): qty 2

## 2016-11-28 MED ORDER — ENOXAPARIN SODIUM 40 MG/0.4ML ~~LOC~~ SOLN
40.0000 mg | SUBCUTANEOUS | Status: DC
  Administered 2016-11-28 – 2016-12-01 (×4): 40 mg via SUBCUTANEOUS
  Filled 2016-11-28 (×4): qty 0.4

## 2016-11-28 MED ORDER — POTASSIUM CHLORIDE CRYS ER 10 MEQ PO TBCR
10.0000 meq | EXTENDED_RELEASE_TABLET | Freq: Every day | ORAL | Status: DC
  Administered 2016-11-28 – 2016-12-01 (×4): 10 meq via ORAL
  Filled 2016-11-28 (×8): qty 1

## 2016-11-28 MED ORDER — NITROGLYCERIN 2 % TD OINT
1.0000 [in_us] | TOPICAL_OINTMENT | Freq: Once | TRANSDERMAL | Status: AC
  Administered 2016-11-28: 1 [in_us] via TOPICAL
  Filled 2016-11-28: qty 1

## 2016-11-28 MED ORDER — METOPROLOL TARTRATE 50 MG PO TABS
50.0000 mg | ORAL_TABLET | Freq: Two times a day (BID) | ORAL | Status: DC
  Administered 2016-11-28 – 2016-12-03 (×9): 50 mg via ORAL
  Filled 2016-11-28 (×10): qty 1

## 2016-11-28 MED ORDER — MAGNESIUM SULFATE 2 GM/50ML IV SOLN
2.0000 g | Freq: Once | INTRAVENOUS | Status: AC
Start: 2016-11-28 — End: 2016-11-28
  Administered 2016-11-28: 2 g via INTRAVENOUS
  Filled 2016-11-28: qty 50

## 2016-11-28 MED ORDER — FUROSEMIDE 10 MG/ML IJ SOLN
40.0000 mg | Freq: Two times a day (BID) | INTRAMUSCULAR | Status: DC
  Administered 2016-11-28 – 2016-11-30 (×4): 40 mg via INTRAVENOUS
  Filled 2016-11-28 (×4): qty 4

## 2016-11-28 MED ORDER — SODIUM CHLORIDE 0.9% FLUSH: 3.0000 mL | INTRAVENOUS | Status: DC | PRN

## 2016-11-28 MED ORDER — INSULIN ASPART 100 UNIT/ML ~~LOC~~ SOLN
0.0000 [IU] | Freq: Every day | SUBCUTANEOUS | Status: DC
  Administered 2016-11-28: 5 [IU] via SUBCUTANEOUS

## 2016-11-28 MED ORDER — TRAMADOL HCL 50 MG PO TABS
50.0000 mg | ORAL_TABLET | Freq: Four times a day (QID) | ORAL | Status: DC | PRN
  Administered 2016-11-28 – 2016-12-03 (×5): 50 mg via ORAL
  Filled 2016-11-28 (×5): qty 1

## 2016-11-28 MED ORDER — INSULIN ASPART 100 UNIT/ML ~~LOC~~ SOLN
0.0000 [IU] | Freq: Three times a day (TID) | SUBCUTANEOUS | Status: DC
  Administered 2016-11-29 – 2016-11-30 (×2): 1 [IU] via SUBCUTANEOUS
  Administered 2016-11-30: 2 [IU] via SUBCUTANEOUS
  Administered 2016-12-01: 1 [IU] via SUBCUTANEOUS
  Administered 2016-12-01: 2 [IU] via SUBCUTANEOUS
  Administered 2016-12-02 – 2016-12-04 (×2): 1 [IU] via SUBCUTANEOUS
  Administered 2016-12-05 (×2): 2 [IU] via SUBCUTANEOUS
  Administered 2016-12-06: 1 [IU] via SUBCUTANEOUS

## 2016-11-28 NOTE — ED Notes (Signed)
CRITICAL VALUE ALERT  Critical Value:  Troponin 0.03  Date & Time Notied: 11/28/16 at 1320  Provider Notified: Dr Theodosia Blender Idol PA  Orders Received/Actions taken: orders to be given

## 2016-11-28 NOTE — H&P (Signed)
History and Physical    Mary [name] CBS:[id number] DOB: 08-16-1945 DOA: 11/28/2016  PCP: Celene Squibb, MD  Patient coming from: home  I have personally briefly reviewed patient's old medical records in [place]  Chief Complaint: fall, shortness of breath  HPI: Mary [name] is a 71 y.o. female with medical history significant of history of diastolic heart failure, diabetes, OSA and morbid obesity. Patient reports that this morning she was walking with her walker and suffered a mechanical fall. She didn't have any loss of consciousness. No head trauma. She was evaluated in the emergency room where she also reported progressive shortness of breath for the past month. She is noting worsening edema in her lower extremities abdomen and worsening dyspnea on exertion. She also feels that she had a significant weight gain over the past several months. Review of records indicate that in 08/2016, discharge weight was noted to be 270 pounds. Weight on arrival to the emergency room was noted to be 300 pounds. No chest pain. She reports compliance with her diuretics, but feels that she may not be producing enough urine. She's not had any vomiting, diarrhea, dysuria. She is also noticed a rash under her breasts bilaterally. This is somewhat painful and erythematous.   ED Course: She was noted to be hypoxic on room air. Mild elevation of creatinine at 1.4. BNP noted to be elevated. Chest x-ray indicated decompensated CHF. She's been referred for treatment of CHF.  Review of Systems: As per HPI otherwise 10 point review of systems negative.    Past Medical History:  Diagnosis Date  . Anemia   . Arthritis    knees  . Cellulitis and abscess of foot 02/12/2012  . CHF (congestive heart failure) ([place])   . Complication of anesthesia    pt states after her hysterectomy the doctor said she was "wild" when she woke up   . Diabetes mellitus   . Diabetes mellitus with neuropathy 07/25/2011  .  Dysrhythmia    tachy- takes Metoprolol  . OSA (obstructive sleep apnea) 07/25/2011   not using CPAP  . Osteomyelitis of right foot ([employer]) 02/14/2012  . Partial Achilles tendon tear 02/14/2012  . Restless leg syndrome 07/25/2011  . Wound, open    right foot    Past Surgical History:  Procedure Laterality Date  . ABDOMINAL HYSTERECTOMY    . ACHILLES TENDON REPAIR    . AMPUTATION Right 11/25/2012   Procedure: AMPUTATION BELOW KNEE;  Surgeon: Newt Minion, MD;  Location: [facility];  Service: Orthopedics;  Laterality: Right;  Right Below Knee Amputation  . BELOW KNEE LEG AMPUTATION Right 11/25/2012   Dr Sharol Given  . BREAST SURGERY Left    Lumpectomy non ca  . CATARACT EXTRACTION W/PHACO Right 10/09/2013   Procedure: CATARACT EXTRACTION PHACO AND INTRAOCULAR LENS PLACEMENT (IOC);  Surgeon: Elta Guadeloupe T. Gershon Crane, MD;  Location: [facility];  Service: Ophthalmology;  Laterality: Right;  CDE:9.05  . CATARACT EXTRACTION W/PHACO Left 10/23/2013   Procedure: ATTEMPTED CATARACT EXTRACTION PHACO AND INTRAOCULAR LENS PLACEMENT;  Surgeon: Elta Guadeloupe T. Gershon Crane, MD;  Location: [facility];  Service: Ophthalmology;  Laterality: Left;  CDE:  4.41  . TOE AMPUTATION     partial rt great toe     reports that she has never smoked. She has never used smokeless tobacco. She reports that she does not drink alcohol or use drugs.  No Known Allergies  Family History  Problem Relation Age of Onset  . Diabetes Sister  Prior to Admission medications   Medication Sig Start Date End Date Taking? Authorizing Provider  cholecalciferol (VITAMIN D) 1000 UNITS tablet Take 1,000 Units by mouth daily.    Yes [provider]  furosemide (LASIX) 20 MG tablet Take 1 tablet (20 mg total) by mouth daily. Patient taking differently: Take 20 mg by mouth 2 (two) times daily.  05/20/16 11/28/16 Yes Lendon Colonel, NP  traMADol (ULTRAM) 50 MG tablet Take 1 tablet (50 mg total) by mouth every 6 (six) hours as needed. 12/05/13  Yes Julianne Rice,  MD  cilostazol (PLETAL) 100 MG tablet Take 100 mg by mouth 2 (two) times daily.  02/15/13   [provider]  compounded topicals builder Apply 1 application topically. KET/DIC/BAC/LID/M/GUA    [provider]  DULoxetine (CYMBALTA) 60 MG capsule Take 60 mg by mouth every morning.  08/22/13   [provider]  ferrous sulfate 325 (65 FE) MG tablet Take 325 mg by mouth 3 (three) times daily with meals.     [provider]  gabapentin (NEURONTIN) 800 MG tablet Take 800 mg by mouth 3 (three) times daily.  04/09/16   [provider]  insulin lispro protamine-lispro (HUMALOG 50/50 MIX) (50-50) 100 UNIT/ML SUSP injection Inject 0.2 mLs (20 Units total) into the skin 3 (three) times daily. Patient taking differently: Inject 20-25 Units into the skin 3 (three) times daily. Patient self adjusts amount based on sugar levels 07/22/15   Kelvin Cellar, MD  lidocaine-prilocaine (EMLA) cream  11/10/16   [provider]  lisinopril-hydrochlorothiazide (PRINZIDE,ZESTORETIC) 20-25 MG tablet Take 1 tablet by mouth daily. 05/20/16   Lendon Colonel, NP  metFORMIN (GLUCOPHAGE) 1000 MG tablet Take 1,000 mg by mouth 2 (two) times daily with a meal.  04/08/16   [provider]  metoprolol (LOPRESSOR) 50 MG tablet TAKE (1) TABLET BY MOUTH TWICE DAILY. 04/08/16   Arnoldo Lenis, MD  Multiple Vitamin (MULTIVITAMIN WITH MINERALS) TABS Take 1 tablet by mouth once a week.     [provider]  potassium chloride (K-DUR) 10 MEQ tablet Take 1 tablet (10 mEq total) by mouth daily. 05/20/16 08/18/16  Lendon Colonel, NP  potassium chloride (K-DUR) 10 MEQ tablet Take 1 tablet by mouth daily. 08/25/16   [provider]  pravastatin (PRAVACHOL) 40 MG tablet Take 40 mg by mouth daily.    [provider]  rOPINIRole (REQUIP) 2 MG tablet Take 2 mg by mouth every evening.     [provider]  Tolnaftate (ABSORBINE JR EX) Apply 1  application topically daily as needed (for pain).    [provider]    Physical Exam: Vitals:   11/28/16 1014 11/28/16 1224 11/28/16 1435 11/28/16 1631  BP:  (!) 149/71 (!) 169/94 (!) 163/84  Pulse:  92 95 86  Resp:  (!) 23 (!) 24   Temp:    97.7 F (36.5 C)  TempSrc:    Oral  SpO2: 98% 99% 96% 98%  Weight:      Height:    5\' 10"  (1.778 m)    Constitutional: NAD, calm, comfortable, morbidly obese Vitals:   11/28/16 1014 11/28/16 1224 11/28/16 1435 11/28/16 1631  BP:  (!) 149/71 (!) 169/94 (!) 163/84  Pulse:  92 95 86  Resp:  (!) 23 (!) 24   Temp:    97.7 F (36.5 C)  TempSrc:    Oral  SpO2: 98% 99% 96% 98%  Weight:      Height:  5\' 10"  (1.778 m)   Eyes: PERRL, lids and conjunctivae normal ENMT: Mucous membranes are moist. Posterior pharynx clear of any exudate or lesions.Normal dentition.  Neck: normal, supple, no masses, no thyromegaly Respiratory: crackles at bases. Normal respiratory effort. No accessory muscle use.  Cardiovascular: Regular rate and rhythm, no murmurs / rubs / gallops. 1-2+ extremity edema. 2+ pedal pulses. No carotid bruits.  Abdomen: no tenderness, no masses palpated. No hepatosplenomegaly. Bowel sounds positive.  Musculoskeletal: no clubbing / cyanosis. Right BKA. Good ROM, no contractures. Normal muscle tone.  Skin: no rashes, lesions, ulcers. No induration Neurologic: CN 2-12 grossly intact. Sensation intact, DTR normal. Strength 5/5 in all 4.  Psychiatric: Normal judgment and insight. Alert and oriented x 3. Normal mood.    Labs on Admission: I have personally reviewed following labs and imaging studies  CBC:  Recent Labs Lab 11/28/16 1225  WBC 6.7  NEUTROABS 4.6  HGB 9.7*  HCT 30.9*  MCV 87.3  PLT 027   Basic Metabolic Panel:  Recent Labs Lab 11/28/16 1225  NA 145  K 3.9  CL 111  CO2 27  GLUCOSE 114*  BUN 29*  CREATININE 1.47*  CALCIUM 7.7*   GFR: Estimated Creatinine Clearance: 53.7 mL/min (A) (by C-G  formula based on SCr of 1.47 mg/dL (H)). Liver Function Tests:  Recent Labs Lab 11/28/16 1225  AST 20  ALT 16  ALKPHOS 85  BILITOT 0.6  PROT 5.8*  ALBUMIN 2.2*   No results for input(s): LIPASE, AMYLASE in the last 168 hours. No results for input(s): AMMONIA in the last 168 hours. Coagulation Profile: No results for input(s): INR, PROTIME in the last 168 hours. Cardiac Enzymes:  Recent Labs Lab 11/28/16 1225  TROPONINI 0.03*   BNP (last 3 results) No results for input(s): PROBNP in the last 8760 hours. HbA1C: No results for input(s): HGBA1C in the last 72 hours. CBG: No results for input(s): GLUCAP in the last 168 hours. Lipid Profile: No results for input(s): CHOL, HDL, LDLCALC, TRIG, CHOLHDL, LDLDIRECT in the last 72 hours. Thyroid Function Tests: No results for input(s): TSH, T4TOTAL, FREET4, T3FREE, THYROIDAB in the last 72 hours. Anemia Panel: No results for input(s): VITAMINB12, FOLATE, FERRITIN, TIBC, IRON, RETICCTPCT in the last 72 hours. Urine analysis:    Component Value Date/Time   COLORURINE YELLOW 04/13/2016 1723   APPEARANCEUR CLEAR 04/13/2016 1723   LABSPEC 1.020 04/13/2016 1723   PHURINE 6.0 04/13/2016 1723   GLUCOSEU 100 (A) 04/13/2016 1723   HGBUR LARGE (A) 04/13/2016 1723   BILIRUBINUR NEGATIVE 04/13/2016 1723   KETONESUR NEGATIVE 04/13/2016 1723   PROTEINUR 100 (A) 04/13/2016 1723   UROBILINOGEN 0.2 01/13/2014 0232   NITRITE NEGATIVE 04/13/2016 1723   LEUKOCYTESUR NEGATIVE 04/13/2016 1723    Radiological Exams on Admission: Dg Chest 1 View  Result Date: 11/28/2016 CLINICAL DATA:  Fall, left shoulder pain EXAM: CHEST 1 VIEW COMPARISON:  07/19/2015 FINDINGS: Cardiomegaly with vascular congestion and bilateral interstitial opacities compatible with interstitial edema. No conchahead no effusions or acute bony abnormality. IMPRESSION: Cardiomegaly with vascular congestion and worsening interstitial prominence, likely interstitial edema.  Electronically Signed   By: Rolm Baptise M.D.   On: 11/28/2016 12:57   Dg Shoulder Left  Result Date: 11/28/2016 CLINICAL DATA:  Fall, left shoulder pain EXAM: LEFT SHOULDER - 2+ VIEW COMPARISON:  None. FINDINGS: Degenerative changes in the left [facility] and glenohumeral joints. Spurring at the rotator cuff insertion. No acute bony abnormality. Specifically, no fracture, subluxation, or dislocation. Soft tissues  are intact. IMPRESSION: No acute bony abnormality.  Mild degenerative changes. Electronically Signed   By: Rolm Baptise M.D.   On: 11/28/2016 12:56    EKG: Independently reviewed. Prolonged QT, sinus rhythm  Assessment/Plan Active Problems:   Acute on chronic diastolic heart failure (HCC)   Diabetes mellitus with neuropathy   OSA (obstructive sleep apnea)   Hyperlipidemia   Hypertension   Morbid obesity ([place])   Candidiasis of breast   Hx of right BKA (HCC)   CHF exacerbation ([place])   Fall   Acute on chronic diastolic CHF (congestive heart failure) ([place])     1. Acute on chronic diastolic congestive heart failure. Start the patient on IV Lasix. Monitor intake and output. Echocardiogram was done in 04/2016, will not repeat at this time. She is on a beta blocker. Hold further ACE inhibitors due to renal dysfunction. 2. Diabetes. Start sliding scale insulin. Will substitute Humalog 50/50 for NovoLog 70/30. Monitor blood sugars. 3. Hypertension. Blood pressures are currently stable. Continue on beta blockers. Hold ACE inhibitor/chlorothiazide 4. Obstructive sleep apnea. Continue CPAP 5. Candidiasis of breast. Will start on fluconazole and nystatin powder. 6. Hyperlipidemia. Continue statin 7. Fall. No significant injuries noted on imaging. Will request physical therapy evaluation  DVT prophylaxis: lovenox Code Status: full code Family Communication: no family present Disposition Plan: discharge home once improved Consults called:  Admission status: inpatient,  telemetry   MEMON,JEHANZEB MD [facility] Pager [phone]  If 7PM-7AM, please contact night-coverage [email or web address] Password [email or web address]  11/28/2016, 5:52 PM

## 2016-11-28 NOTE — ED Notes (Addendum)
Call from Ed lab:  Critical lab: Trop 0.03  crystal , RN apprised

## 2016-11-28 NOTE — ED Triage Notes (Signed)
Patient states that she fell when she got up from bed. States she tripped on her walker. Patient denies LOC or hitting head. States left should pain from fall. Full range of movement with left arm. NAD

## 2016-11-28 NOTE — ED Provider Notes (Signed)
[facility] DEPT Provider Note   CSN: [id number] Arrival date & time: 11/28/16  3524     History   Chief Complaint Chief Complaint  Patient presents with  . Fall    HPI NASHIA REMUS is a 71 y.o. female with a history DM, CHF, sleep apnea and unstable balance with right lower extremity bka secondary to DM complications presenting with 2 complaints, the first being a fall which occurred prior to arrival, stating she was trying to get to the bathroom using her walker when she tripped and fell, landing on her left shoulder. She denies head or neck injury And denies confusion, focal weakness back or chest pain since the event.    Secondly she endorses increasing shortness of breath, family member at bedside and patient estimates she has gained at least 60 pounds over the past several months and has had increased swelling in her left leg.  She denies chest pain, cough, fevers or chills.  She was seen by her PCP a week ago and advised to increase her Lasix to 40 mg twice a day which she did for 1 day, but was afraid to continue taking this increased dose as she knows and will affect her kidneys.  She reports positive orthopnea, sleeps sitting propped up on her side.  She is not under any oral fluid restrictions.        The history is provided by the patient and a relative.    Past Medical History:  Diagnosis Date  . Anemia   . Arthritis    knees  . Cellulitis and abscess of foot 02/12/2012  . CHF (congestive heart failure) ([place])   . Complication of anesthesia    pt states after her hysterectomy the doctor said she was "wild" when she woke up   . Diabetes mellitus   . Diabetes mellitus with neuropathy 07/25/2011  . Dysrhythmia    tachy- takes Metoprolol  . OSA (obstructive sleep apnea) 07/25/2011   not using CPAP  . Osteomyelitis of right foot ([place]) 02/14/2012  . Partial Achilles tendon tear 02/14/2012  . Restless leg syndrome 07/25/2011  . Wound, open    right foot     Patient Active Problem List   Diagnosis Date Noted  . Elevated troponin 07/19/2015  . Diastolic CHF, acute on chronic (HCC) 01/13/2014  . Expressive aphasia 01/13/2014  . CHF (congestive heart failure) ([place])   . Unstable balance 01/16/2013  . Difficulty in walking(719.7) 01/16/2013  . Infected wound 01/16/2013  . Hx of right BKA ([place]) 12/23/2012  . Candidiasis of breast 12/08/2012  . S/P bilateral BKA (below knee amputation) ([place]) 11/30/2012  . Diabetes ([place]) 11/30/2012  . Diabetic foot ulcer ([place]) 10/08/2012  . Anxiety 02/17/2012  . Osteomyelitis of right foot ([place]) 02/14/2012  . Hematuria, microscopic 02/13/2012  . PVD (peripheral vascular disease) ([place]) 02/08/2012  . Open wound of heel 02/08/2012  . Hyperlipidemia 11/11/2011  . Hypertension 11/11/2011  . Morbid obesity ([place]) 11/11/2011  . Tachycardia 08/04/2011  . Acute on chronic diastolic heart failure ([place]) 07/25/2011  . Diabetes mellitus with neuropathy 07/25/2011  . OSA (obstructive sleep apnea) 07/25/2011  . Generalized weakness 07/25/2011  . Acute respiratory failure ([place]) 07/25/2011  . Restless leg syndrome 07/25/2011  . Anemia 07/25/2011    Past Surgical History:  Procedure Laterality Date  . ABDOMINAL HYSTERECTOMY    . ACHILLES TENDON REPAIR    . AMPUTATION Right 11/25/2012   Procedure: AMPUTATION BELOW KNEE;  Surgeon: Newt Minion, MD;  Location: [facility];  Service: Orthopedics;  Laterality: Right;  Right Below Knee Amputation  . BELOW KNEE LEG AMPUTATION Right 11/25/2012   Dr Sharol Given  . BREAST SURGERY Left    Lumpectomy non ca  . CATARACT EXTRACTION W/PHACO Right 10/09/2013   Procedure: CATARACT EXTRACTION PHACO AND INTRAOCULAR LENS PLACEMENT (IOC);  Surgeon: Elta Guadeloupe T. Gershon Crane, MD;  Location: [facility];  Service: Ophthalmology;  Laterality: Right;  CDE:9.05  . CATARACT EXTRACTION W/PHACO Left 10/23/2013   Procedure: ATTEMPTED CATARACT EXTRACTION PHACO AND INTRAOCULAR LENS PLACEMENT;  Surgeon: Elta Guadeloupe T. Gershon Crane, MD;   Location: [facility];  Service: Ophthalmology;  Laterality: Left;  CDE:  4.41  . TOE AMPUTATION     partial rt great toe    OB History    Gravida Para Term Preterm AB Living   1 1 1     1    SAB TAB Ectopic Multiple Live Births                   Home Medications    Prior to Admission medications   Medication Sig Start Date End Date Taking? Authorizing Provider  cholecalciferol (VITAMIN D) 1000 UNITS tablet Take 1,000 Units by mouth daily.    Yes [provider]  furosemide (LASIX) 20 MG tablet Take 1 tablet (20 mg total) by mouth daily. Patient taking differently: Take 20 mg by mouth 2 (two) times daily.  05/20/16 11/28/16 Yes Lendon Colonel, NP  traMADol (ULTRAM) 50 MG tablet Take 1 tablet (50 mg total) by mouth every 6 (six) hours as needed. 12/05/13  Yes Julianne Rice, MD  cilostazol (PLETAL) 100 MG tablet Take 100 mg by mouth 2 (two) times daily.  02/15/13   [provider]  compounded topicals builder Apply 1 application topically. KET/DIC/BAC/LID/M/GUA    [provider]  DULoxetine (CYMBALTA) 60 MG capsule Take 60 mg by mouth every morning.  08/22/13   [provider]  ferrous sulfate 325 (65 FE) MG tablet Take 325 mg by mouth 3 (three) times daily with meals.     [provider]  gabapentin (NEURONTIN) 800 MG tablet Take 800 mg by mouth 3 (three) times daily.  04/09/16   [provider]  insulin lispro protamine-lispro (HUMALOG 50/50 MIX) (50-50) 100 UNIT/ML SUSP injection Inject 0.2 mLs (20 Units total) into the skin 3 (three) times daily. Patient taking differently: Inject 20-25 Units into the skin 3 (three) times daily. Patient self adjusts amount based on sugar levels 07/22/15   Kelvin Cellar, MD  lidocaine-prilocaine (EMLA) cream  11/10/16   [provider]  lisinopril-hydrochlorothiazide (PRINZIDE,ZESTORETIC) 20-25 MG tablet Take 1 tablet by mouth daily. 05/20/16   Lendon Colonel, NP  metFORMIN (GLUCOPHAGE)  1000 MG tablet Take 1,000 mg by mouth 2 (two) times daily with a meal.  04/08/16   [provider]  metoprolol (LOPRESSOR) 50 MG tablet TAKE (1) TABLET BY MOUTH TWICE DAILY. 04/08/16   Arnoldo Lenis, MD  Multiple Vitamin (MULTIVITAMIN WITH MINERALS) TABS Take 1 tablet by mouth once a week.     [provider]  potassium chloride (K-DUR) 10 MEQ tablet Take 1 tablet (10 mEq total) by mouth daily. 05/20/16 08/18/16  Lendon Colonel, NP  potassium chloride (K-DUR) 10 MEQ tablet Take 1 tablet by mouth daily. 08/25/16   [provider]  pravastatin (PRAVACHOL) 40 MG tablet Take 40 mg by mouth daily.    [provider]  rOPINIRole (REQUIP) 2 MG tablet Take 2 mg by  mouth every evening.     [provider]  Tolnaftate (ABSORBINE JR EX) Apply 1 application topically daily as needed (for pain).    [provider]    Family History Family History  Problem Relation Age of Onset  . Diabetes Sister     Social History Social History  Substance Use Topics  . Smoking status: Never Smoker  . Smokeless tobacco: Never Used  . Alcohol use No     Allergies   Patient has no known allergies.   Review of Systems Review of Systems  Constitutional: Negative for fever.  HENT: Negative for congestion and sore throat.   Eyes: Negative.   Respiratory: Positive for shortness of breath. Negative for chest tightness.   Cardiovascular: Positive for leg swelling. Negative for chest pain and palpitations.  Gastrointestinal: Negative for abdominal pain, nausea and vomiting.  Genitourinary: Negative.   Musculoskeletal: Positive for arthralgias. Negative for joint swelling and neck pain.  Skin: Negative.  Negative for rash and wound.  Neurological: Negative for dizziness, weakness, light-headedness, numbness and headaches.  Psychiatric/Behavioral: Negative.      Physical Exam Updated Vital Signs BP (!) 149/71 (BP Location: Right Arm)   Pulse 92    Temp 97.7 F (36.5 C) (Oral)   Resp (!) 23   Ht 5\' 10"  (1.778 m)   Wt 136.1 kg (300 lb)   LMP 07/25/2011   SpO2 99%   BMI 43.05 kg/m   Physical Exam  Constitutional: She appears well-developed and well-nourished.  HENT:  Head: Normocephalic and atraumatic.  Eyes: Conjunctivae are normal.  Neck: Normal range of motion.  Cardiovascular: Normal rate, regular rhythm, normal heart sounds and intact distal pulses.   Pulmonary/Chest: Effort normal. No respiratory distress. She has no wheezes. She has rales in the right lower field and the left lower field.  Initial O2 sats  88% which increased to 98% on 2 L of oxygen.  Abdominal: Soft. Bowel sounds are normal. There is no tenderness.  Musculoskeletal: Normal range of motion. She exhibits edema and tenderness. She exhibits no deformity.       Left shoulder: She exhibits tenderness. She exhibits no bony tenderness, no swelling and no deformity.       Arms: Left leg pitting edema which is trace to the left distal thigh.  Neurological: She is alert.  Skin: Skin is warm and dry.  Psychiatric: She has a normal mood and affect.  Nursing note and vitals reviewed.    ED Treatments / Results  Labs (all labs ordered are listed, but only abnormal results are displayed) Labs Reviewed  COMPREHENSIVE METABOLIC PANEL - Abnormal; Notable for the following:       Result Value   Glucose, Bld 114 (*)    BUN 29 (*)    Creatinine, Ser 1.47 (*)    Calcium 7.7 (*)    Total Protein 5.8 (*)    Albumin 2.2 (*)    GFR calc non Af Amer 35 (*)    GFR calc Af Amer 41 (*)    All other components within normal limits  CBC WITH DIFFERENTIAL/PLATELET - Abnormal; Notable for the following:    RBC 3.54 (*)    Hemoglobin 9.7 (*)    HCT 30.9 (*)    RDW 16.1 (*)    All other components within normal limits  TROPONIN I - Abnormal; Notable for the following:    Troponin I 0.03 (*)    All other components within normal limits  BRAIN NATRIURETIC PEPTIDE -  Abnormal; Notable for the following:    B Natriuretic Peptide 1,485.0 (*)    All other components within normal limits    EKG  EKG Interpretation  Date/Time:  Sunday November 28 2016 12:18:55 EDT Ventricular Rate:  91 PR Interval:    QRS Duration: 101 QT Interval:  418 QTC Calculation: 515 R Axis:   -22 Text Interpretation:  Normal sinus rhythm Left axis deviation Abnormal lateral Q waves Anterior infarct, old Prolonged QT interval When compared with ECG of 04/13/2016 QT has lengthened Otherwise no significant change Confirmed by [facility]  MD, Nunzio Cory [phone]) on 11/28/2016 1:19:26 PM       Radiology Dg Chest 1 View  Result Date: 11/28/2016 CLINICAL DATA:  Fall, left shoulder pain EXAM: CHEST 1 VIEW COMPARISON:  07/19/2015 FINDINGS: Cardiomegaly with vascular congestion and bilateral interstitial opacities compatible with interstitial edema. No concha  head no effusions or acute bony abnormality. IMPRESSION: Cardiomegaly with vascular congestion and worsening interstitial prominence, likely interstitial edema. Electronically Signed   By: Rolm Baptise M.D.   On: 11/28/2016 12:57   Dg Shoulder Left  Result Date: 11/28/2016 CLINICAL DATA:  Fall, left shoulder pain EXAM: LEFT SHOULDER - 2+ VIEW COMPARISON:  None. FINDINGS: Degenerative changes in the left [facility] and glenohumeral joints. Spurring at the rotator cuff insertion. No acute bony abnormality. Specifically, no fracture, subluxation, or dislocation. Soft tissues are intact. IMPRESSION: No acute bony abnormality.  Mild degenerative changes. Electronically Signed   By: Rolm Baptise M.D.   On: 11/28/2016 12:56    Procedures Procedures (including critical care time)  Medications Ordered in ED Medications  furosemide (LASIX) injection 40 mg (not administered)  nitroGLYCERIN (NITROGLYN) 2 % ointment 1 inch (not administered)     Initial Impression / Assessment and Plan / ED Course  I have reviewed the triage vital signs and the nursing  notes.  Pertinent labs & imaging results that were available during my care of the patient were reviewed by me and considered in my medical decision making (see chart for details).     Imaging reviewed with no acute fractures of left shoulder.  CHF per chest x-ray and labs.  Patient was given Lasix per IV, nitroglycerin paste added.  Patient would benefit from admission for further diuresis.    Call to Dr. Roderic Palau , agrees with admission.  Final Clinical Impressions(s) / ED Diagnoses   Final diagnoses:  Acute on chronic diastolic congestive heart failure [facility])  Fall, initial encounter    New Prescriptions New Prescriptions   No medications on file     Evalee Jefferson, Hershal Coria 11/28/16 [place], [place], DO 12/05/16 1106

## 2016-11-29 LAB — BASIC METABOLIC PANEL
Anion gap: 6 (ref 5–15)
BUN: 29 mg/dL — AB (ref 6–20)
CHLORIDE: 109 mmol/L (ref 101–111)
CO2: 26 mmol/L (ref 22–32)
Calcium: 7.5 mg/dL — ABNORMAL LOW (ref 8.9–10.3)
Creatinine, Ser: 1.52 mg/dL — ABNORMAL HIGH (ref 0.44–1.00)
GFR calc Af Amer: 39 mL/min — ABNORMAL LOW (ref >60)
GFR calc non Af Amer: 34 mL/min — ABNORMAL LOW (ref >60)
Glucose, Bld: 92 mg/dL (ref 65–99)
POTASSIUM: 4.3 mmol/L (ref 3.5–5.1)
SODIUM: 141 mmol/L (ref 135–145)

## 2016-11-29 LAB — GLUCOSE, CAPILLARY
GLUCOSE-CAPILLARY: 99 mg/dL (ref 65–99)
GLUCOSE-CAPILLARY: 55 mg/dL — AB (ref 65–99)
GLUCOSE-CAPILLARY: 65 mg/dL (ref 65–99)
GLUCOSE-CAPILLARY: 67 mg/dL (ref 65–99)
Glucose-Capillary: 132 mg/dL — ABNORMAL HIGH (ref 65–99)
Glucose-Capillary: 45 mg/dL — ABNORMAL LOW (ref 65–99)
Glucose-Capillary: 89 mg/dL (ref 65–99)
Glucose-Capillary: 64 mg/dL — ABNORMAL LOW (ref 65–99)
Glucose-Capillary: 56 mg/dL — ABNORMAL LOW (ref 65–99)
Glucose-Capillary: 106 mg/dL — ABNORMAL HIGH (ref 65–99)
Glucose-Capillary: 65 mg/dL (ref 65–99)

## 2016-11-29 MED ORDER — DEXTROSE 50 % IV SOLN
INTRAVENOUS | Status: AC
  Administered 2016-11-29: 12.5 g via INTRAVENOUS
  Filled 2016-11-29: qty 50

## 2016-11-29 MED ORDER — INSULIN ASPART PROT & ASPART (70-30 MIX) 100 UNIT/ML ~~LOC~~ SUSP
10.0000 [IU] | Freq: Two times a day (BID) | SUBCUTANEOUS | Status: DC
  Administered 2016-12-01 – 2016-12-02 (×3): 10 [IU] via SUBCUTANEOUS

## 2016-11-29 MED ORDER — GLUCOSE 40 % PO GEL
ORAL | Status: AC
  Administered 2016-11-29: 37.5 g via ORAL
  Filled 2016-11-29: qty 1

## 2016-11-29 MED ORDER — DEXTROSE 50 % IV SOLN
12.5000 g | Freq: Once | INTRAVENOUS | Status: AC
  Administered 2016-11-29 (×2): 12.5 g via INTRAVENOUS

## 2016-11-29 MED ORDER — GABAPENTIN 400 MG PO CAPS
800.0000 mg | ORAL_CAPSULE | Freq: Three times a day (TID) | ORAL | Status: DC
  Administered 2016-11-29 (×3): 800 mg via ORAL
  Filled 2016-11-29 (×3): qty 2

## 2016-11-29 MED ORDER — GLUCOSE 40 % PO GEL
1.0000 | Freq: Once | ORAL | Status: AC
  Administered 2016-11-29: 37.5 g via ORAL

## 2016-11-29 NOTE — Progress Notes (Signed)
Pt's daughter, [name], requested that all four rails of the bed be raised due to patient falling out of the bed frequently at home. Daughter states PT moves a lot in her sleep with her restless leg syndrome and all four rails would keep her safer.

## 2016-11-29 NOTE — Progress Notes (Signed)
Abnormal ECG this morning. PT sinus rhythm with low voltage QRS. States that an anterior infarct cannot be ruled out.

## 2016-11-29 NOTE — Progress Notes (Signed)
Hypoglycemic Event  CBG: 45 at 1617  Treatment: 15 GM carbohydrate snack  Symptoms: Sweaty and alert and oriented  Follow-up CBG: Time:65 CBG Result:1647  Possible Reasons for Event: Unknown  Comments/MD notified:Dr Memon notified, will review insulin    Arthur Holms, Gypsy Balsam

## 2016-11-29 NOTE — Progress Notes (Signed)
Recheck CBG after dinner half complete.  CBG 55.  Glucose gel given per protocol and Dr Roderic Palau notified via [place].  Will recheck and continue to monitor.

## 2016-11-29 NOTE — Progress Notes (Signed)
Patient refusing CPAP use at night. No unit in room at this time.

## 2016-11-29 NOTE — Progress Notes (Signed)
After glucose gel given, CBG 67.  12.5 gm of Dextrose 50 given (1/2 ampule), repeat CBG 89.  Pt is drowsy, but easily arouses and oriented.  Dr Roderic Palau notified via [place].  Will continue to monitor pt.

## 2016-11-29 NOTE — Progress Notes (Signed)
PT ordered lasix and strict I&O. PT weak and lethargic, unable to walk with 2 people assisting and walker to [facility]. Pt has fallen 7-8 times just this week, safety is an issue. Attempted external catheter with no success. Pt had 3 incontinent episodes of urine. Due to strict I&O, fall risk and diuresis of fluids from CHF, foley catheter ordered by MD. Foley placed with no complications, Pt tolerated well, 1200 urine output at insertion. Continue to monitor.

## 2016-11-29 NOTE — Progress Notes (Signed)
PROGRESS NOTE    Mary [name]  VFI:[id number] DOB: 1945-09-06 DOA: 11/28/2016 PCP: Celene Squibb, MD    Brief Narrative:  71 year old female with a history of diastolic heart failure, presents to the ER after a fall was noted to be short of breath with acute on chronic heart failure. She was admitted to the hospital for IV diuresis.   Assessment & Plan:   Active Problems:   Acute on chronic diastolic heart failure (HCC)   Diabetes mellitus with neuropathy   OSA (obstructive sleep apnea)   Hyperlipidemia   Hypertension   Morbid obesity ([name])   Candidiasis of breast   Hx of right BKA (HCC)   CHF exacerbation ([name])   Fall   Acute on chronic diastolic CHF (congestive heart failure) ([name])   1. Acute on chronic diastolic congestive heart failure. Patient is currently on IV lasix with good urine output. She is still short of breath and has peripheral edema requiring continued IV diuresis. Monitor intake and output. Echocardiogram was done in 04/2016, will not repeat at this time. She is on a beta blocker. Hold further ACE inhibitors due to renal dysfunction. 2. Diabetes. On sliding scale insulin and 70/30. Blood sugars stable. 3. Hypertension. Blood pressures are currently stable. Continue on beta blockers. Hold ACE inhibitor/chlorothiazide 4. Obstructive sleep apnea. Noncompliant with CPAP, will likely need CPAP titration study as an outpatient 5. Candidiasis of breast. continue on fluconazole and nystatin powder. 6. Hyperlipidemia. Continue statin 7. Fall. No significant injuries noted on imaging. Physical therapy evaluation pending   DVT prophylaxis: Lovenox Code Status: Full code Family Communication: Discussed with husband at the bedside Disposition Plan: Depending on how she progresses, may need placement.   Consultants:     Procedures:     Antimicrobials:       Subjective: Still feel short of breath. No chest pain.  Objective: Vitals:   11/28/16 2134  11/29/16 0451 11/29/16 0616 11/29/16 1435  BP: (!) 182/112  138/72 (!) 130/57  Pulse: 90  61 60  Resp: (!) 24  18 16   Temp: 97.9 F (36.6 C)  97.5 F (36.4 C) 97.8 F (36.6 C)  TempSrc: Oral  Oral Oral  SpO2: 100%  100% 100%  Weight:  (!) 142.5 kg (314 lb 1.6 oz)    Height:        Intake/Output Summary (Last 24 hours) at 11/29/16 1542 Last data filed at 11/29/16 1300  Gross per 24 hour  Intake              723 ml  Output             1800 ml  Net            -1077 ml   Filed Weights   11/28/16 1004 11/28/16 1631 11/29/16 0451  Weight: 136.1 kg (300 lb) 136.1 kg (300 lb) (!) 142.5 kg (314 lb 1.6 oz)    Examination:  General exam: Appears calm and comfortable  Respiratory system: crackles at bases. Respiratory effort normal. Cardiovascular system: S1 & S2 heard, RRR. No JVD, murmurs, rubs, gallops or clicks. 1+ pedal edema with generalized anasarca. Gastrointestinal system: Abdomen is nondistended, soft and nontender. No organomegaly or masses felt. Normal bowel sounds heard. Central nervous system: Alert and oriented. No focal neurological deficits. Extremities: Symmetric 5 x 5 power. Right BKA Skin: No rashes, lesions or ulcers Psychiatry: Judgement and insight appear normal. Mood & affect appropriate.     Data Reviewed: I have personally reviewed  following labs and imaging studies  CBC:  Recent Labs Lab 11/28/16 1225  WBC 6.7  NEUTROABS 4.6  HGB 9.7*  HCT 30.9*  MCV 87.3  PLT 254   Basic Metabolic Panel:  Recent Labs Lab 11/28/16 1225 11/29/16 0559  NA 145 141  K 3.9 4.3  CL 111 109  CO2 27 26  GLUCOSE 114* 92  BUN 29* 29*  CREATININE 1.47* 1.52*  CALCIUM 7.7* 7.5*  MG 1.5*  --    GFR: Estimated Creatinine Clearance: 53.3 mL/min (A) (by C-G formula based on SCr of 1.52 mg/dL (H)). Liver Function Tests:  Recent Labs Lab 11/28/16 1225  AST 20  ALT 16  ALKPHOS 85  BILITOT 0.6  PROT 5.8*  ALBUMIN 2.2*   No results for input(s): LIPASE,  AMYLASE in the last 168 hours. No results for input(s): AMMONIA in the last 168 hours. Coagulation Profile: No results for input(s): INR, PROTIME in the last 168 hours. Cardiac Enzymes:  Recent Labs Lab 11/28/16 1225  TROPONINI 0.03*   BNP (last 3 results) No results for input(s): PROBNP in the last 8760 hours. HbA1C: No results for input(s): HGBA1C in the last 72 hours. CBG:  Recent Labs Lab 11/28/16 2050 11/29/16 0726 11/29/16 1113  GLUCAP 383* 99 132*   Lipid Profile: No results for input(s): CHOL, HDL, LDLCALC, TRIG, CHOLHDL, LDLDIRECT in the last 72 hours. Thyroid Function Tests: No results for input(s): TSH, T4TOTAL, FREET4, T3FREE, THYROIDAB in the last 72 hours. Anemia Panel: No results for input(s): VITAMINB12, FOLATE, FERRITIN, TIBC, IRON, RETICCTPCT in the last 72 hours. Sepsis Labs: No results for input(s): PROCALCITON, LATICACIDVEN in the last 168 hours.  No results found for this or any previous visit (from the past 240 hour(s)).       Radiology Studies: Dg Chest 1 View  Result Date: 11/28/2016 CLINICAL DATA:  Fall, left shoulder pain EXAM: CHEST 1 VIEW COMPARISON:  07/19/2015 FINDINGS: Cardiomegaly with vascular congestion and bilateral interstitial opacities compatible with interstitial edema. No head no effusions or acute bony abnormality. IMPRESSION: Cardiomegaly with vascular congestion and worsening interstitial prominence, likely interstitial edema. Electronically Signed   By: Rolm Baptise M.D.   On: 11/28/2016 12:57   Dg Shoulder Left  Result Date: 11/28/2016 CLINICAL DATA:  Fall, left shoulder pain EXAM: LEFT SHOULDER - 2+ VIEW COMPARISON:  None. FINDINGS: Degenerative changes in the left [facility] and glenohumeral joints. Spurring at the rotator cuff insertion. No acute bony abnormality. Specifically, no fracture, subluxation, or dislocation. Soft tissues are intact. IMPRESSION: No acute bony abnormality.  Mild degenerative changes. Electronically Signed    By: Rolm Baptise M.D.   On: 11/28/2016 12:56        Scheduled Meds: . cilostazol  100 mg Oral BID  . DULoxetine  60 mg Oral q morning - 10a  . enoxaparin (LOVENOX) injection  40 mg Subcutaneous Q24H  . fluconazole  100 mg Oral Daily  . furosemide  40 mg Intravenous BID  . gabapentin  800 mg Oral TID  . insulin aspart  0-5 Units Subcutaneous QHS  . insulin aspart  0-9 Units Subcutaneous TID WC  . insulin aspart protamine- aspart  20 Units Subcutaneous TID WC  . metoprolol tartrate  50 mg Oral BID  . nystatin   Topical TID  . potassium chloride  10 mEq Oral Daily  . pravastatin  40 mg Oral Daily  . rOPINIRole  2 mg Oral QPM  . sodium chloride flush  3 mL Intravenous Q12H  Continuous Infusions: . sodium chloride       LOS: 1 day    Time spent: 49mins    Amica Harron, MD [facility] Pager [phone]  If 7PM-7AM, please contact night-coverage [email or web address] Password [facility] 11/29/2016, 3:42 PM

## 2016-11-29 NOTE — Progress Notes (Signed)
Pt informed RN that she did not want to wear CPAP tonight states she will wear the 2lpm cann because she do not wear one at home. There is not CPAP use listed for home use.

## 2016-11-29 NOTE — Evaluation (Signed)
Physical Therapy Evaluation Patient Details Name: Mary [name] MRN: [id number] DOB: 1946/03/26 Today's Date: 11/29/2016   History of Present Illness  71 yo female with 7-8 falls in the last week was admitted, hs possible MI and added 40# fluid to her system, CHF and cardiomegaly.  PMHx:  CHF, DM, OSA, obesity  Clinical Impression  Pt is up to side of bed with max assist and cannot manage to stand yet, although she is wearing her prosthesis in bed.  Pt has loose R prosthesis with not enough socks to stand on it.  Talked with her and she did not seem to understand the reason.  Will follow acutely for progression of strengthenign and transfers as tolerated.    Follow Up Recommendations SNF    Equipment Recommendations  None recommended by PT    Recommendations for Other Services       Precautions / Restrictions Precautions Precautions: Fall (telemetry) Required Braces or Orthoses: Other Brace/Splint (R  lower leg prosthesis BK) Restrictions Weight Bearing Restrictions: No      Mobility  Bed Mobility Overal bed mobility: Needs Assistance Bed Mobility: Supine to Sit;Sit to Supine     Supine to sit: Max assist Sit to supine: Max assist   General bed mobility comments: Pt is moving up to side of bed with cued hand placement and support of trunk and to slide hips out  Transfers Overall transfer level: Needs assistance Equipment used: Rolling walker (2 wheeled) Transfers: Sit to/from Stand Sit to Stand: Total assist            Ambulation/Gait             General Gait Details: Pt cannot stand up to attempt  Stairs            Wheelchair Mobility    Modified Rankin (Stroke Patients Only)       Balance Overall balance assessment: Needs assistance Sitting-balance support: Feet supported;Bilateral upper extremity supported Sitting balance-Leahy Scale: Fair     Standing balance support: Bilateral upper extremity supported Standing balance-Leahy  Scale: Zero                               Pertinent Vitals/Pain Pain Assessment: Faces Faces Pain Scale: Hurts even more Pain Location: joint pain in LE's, OA  Pain Descriptors / Indicators: Sore;Aching Pain Intervention(s): Limited activity within patient's tolerance;Monitored during session;Premedicated before session;Repositioned    Home Living Family/patient expects to be discharged to:: Skilled nursing facility Living Arrangements: Spouse/significant other                    Prior Function Level of Independence: Needs assistance   Gait / Transfers Assistance Needed: very slow pace to change rooms on R BK prosthesis and RW  ADL's / Homemaking Assistance Needed: husband does cooking and housework        Hand Dominance   Dominant Hand: Right    Extremity/Trunk Assessment   Upper Extremity Assessment Upper Extremity Assessment: Generalized weakness    Lower Extremity Assessment Lower Extremity Assessment: Generalized weakness    Cervical / Trunk Assessment Cervical / Trunk Assessment: Normal  Communication   Communication: No difficulties  Cognition Arousal/Alertness: Lethargic Behavior During Therapy: Flat affect Overall Cognitive Status: Impaired/Different from baseline Area of Impairment: Attention;Memory;Following commands;Safety/judgement;Awareness;Problem solving                   Current Attention Level: Selective Memory: Decreased short-term memory  Following Commands: Follows one step commands with increased time Safety/Judgement: Decreased awareness of deficits;Decreased awareness of safety Awareness: Intellectual Problem Solving: Decreased initiation;Difficulty sequencing;Requires verbal cues General Comments: pt does make some effort to move but mainly passive      General Comments General comments (skin integrity, edema, etc.): Pt is sleepy and has a RLE prosthesis that pivots on her rLE, needs more sock layers.     Exercises     Assessment/Plan    PT Assessment Patient needs continued PT services  PT Problem List Decreased strength;Decreased range of motion;Decreased activity tolerance;Decreased balance;Decreased mobility;Decreased coordination;Decreased knowledge of use of DME;Decreased safety awareness;Decreased knowledge of precautions;Cardiopulmonary status limiting activity;Obesity;Decreased skin integrity       PT Treatment Interventions DME instruction;Gait training;Functional mobility training;Therapeutic activities;Balance training;Therapeutic exercise;Neuromuscular re-education;Patient/family education    PT Goals (Current goals can be found in the Care Plan section)  Acute Rehab PT Goals Patient Stated Goal: To be able to stand up again PT Goal Formulation: With patient/family Time For Goal Achievement: 12/13/16 Potential to Achieve Goals: Good    Frequency Min 3X/week   Barriers to discharge Decreased caregiver support husband is at home with pt who needs 2 person assist    Co-evaluation               AM-PAC PT "6 Clicks" Daily Activity  Outcome Measure Difficulty turning over in bed (including adjusting bedclothes, sheets and blankets)?: Total Difficulty moving from lying on back to sitting on the side of the bed? : Total Difficulty sitting down on and standing up from a chair with arms (e.g., wheelchair, bedside commode, etc,.)?: Total Help needed moving to and from a bed to chair (including a wheelchair)?: Total Help needed walking in hospital room?: Total Help needed climbing 3-5 steps with a railing? : Total 6 Click Score: 6    End of Session Equipment Utilized During Treatment: Gait belt Activity Tolerance: Patient limited by fatigue;Patient limited by lethargy Patient left: in bed;with call bell/phone within reach;with bed alarm set;with family/visitor present Nurse Communication: Mobility status PT Visit Diagnosis: Repeated falls (R29.6);Muscle weakness  (generalized) (M62.81);Difficulty in walking, not elsewhere classified (R26.2)    Time: 1330-1350 PT Time Calculation (min) (ACUTE ONLY): 20 min   Charges:   PT Evaluation $PT Eval Moderate Complexity: 1 Procedure     PT G Codes:   PT G-Codes **NOT FOR INPATIENT CLASS** Functional Assessment Tool Used: AM-PAC 6 Clicks Basic Mobility    Ramond Dial 11/29/2016, 8:38 PM   8:41 PM, 11/29/16 Mee Hives, PT, MS Physical Therapist - [place] [phone] (Office)

## 2016-11-30 DIAGNOSIS — N184 Chronic kidney disease, stage 4 (severe): Secondary | ICD-10-CM | POA: Diagnosis present

## 2016-11-30 LAB — BASIC METABOLIC PANEL
Anion gap: 7 (ref 5–15)
BUN: 33 mg/dL — ABNORMAL HIGH (ref 6–20)
CHLORIDE: 106 mmol/L (ref 101–111)
CO2: 26 mmol/L (ref 22–32)
Calcium: 7.6 mg/dL — ABNORMAL LOW (ref 8.9–10.3)
Creatinine, Ser: 1.74 mg/dL — ABNORMAL HIGH (ref 0.44–1.00)
GFR calc Af Amer: 33 mL/min — ABNORMAL LOW (ref >60)
GFR calc non Af Amer: 29 mL/min — ABNORMAL LOW (ref >60)
GLUCOSE: 75 mg/dL (ref 65–99)
POTASSIUM: 4.5 mmol/L (ref 3.5–5.1)
Sodium: 139 mmol/L (ref 135–145)

## 2016-11-30 LAB — GLUCOSE, CAPILLARY
GLUCOSE-CAPILLARY: 141 mg/dL — AB (ref 65–99)
GLUCOSE-CAPILLARY: 155 mg/dL — AB (ref 65–99)
GLUCOSE-CAPILLARY: 75 mg/dL (ref 65–99)
GLUCOSE-CAPILLARY: 81 mg/dL (ref 65–99)
GLUCOSE-CAPILLARY: 88 mg/dL (ref 65–99)
Glucose-Capillary: 77 mg/dL (ref 65–99)
Glucose-Capillary: 69 mg/dL (ref 65–99)
Glucose-Capillary: 136 mg/dL — ABNORMAL HIGH (ref 65–99)
Glucose-Capillary: 127 mg/dL — ABNORMAL HIGH (ref 65–99)

## 2016-11-30 MED ORDER — FUROSEMIDE 10 MG/ML IJ SOLN
40.0000 mg | Freq: Every day | INTRAMUSCULAR | Status: DC
  Administered 2016-12-01: 40 mg via INTRAVENOUS
  Filled 2016-11-30: qty 4

## 2016-11-30 MED ORDER — DEXTROSE 50 % IV SOLN
INTRAVENOUS | Status: AC
  Administered 2016-11-30: 50 mL
  Filled 2016-11-30: qty 50

## 2016-11-30 MED ORDER — GABAPENTIN 400 MG PO CAPS
400.0000 mg | ORAL_CAPSULE | Freq: Three times a day (TID) | ORAL | Status: DC
  Administered 2016-11-30 – 2016-12-02 (×7): 400 mg via ORAL
  Filled 2016-11-30 (×7): qty 1

## 2016-11-30 MED ORDER — IPRATROPIUM-ALBUTEROL 0.5-2.5 (3) MG/3ML IN SOLN
3.0000 mL | Freq: Four times a day (QID) | RESPIRATORY_TRACT | Status: DC
  Filled 2016-11-30: qty 3

## 2016-11-30 MED ORDER — IPRATROPIUM-ALBUTEROL 0.5-2.5 (3) MG/3ML IN SOLN
3.0000 mL | Freq: Four times a day (QID) | RESPIRATORY_TRACT | Status: DC | PRN
  Administered 2016-12-02: 3 mL via RESPIRATORY_TRACT
  Filled 2016-11-30: qty 3

## 2016-11-30 NOTE — Progress Notes (Addendum)
PROGRESS NOTE    Mary [name]  ZOX:[id number] DOB: 07-12-1945 DOA: 11/28/2016   PCP: Celene Squibb, MD   Brief Narrative:  Pt is 71 yo female with known diastolic CHF, presented to ED after an episode of fall and upon arrival to ED, was found to be dyspneic, though to be due to acute on chronic CHF. [facility] asked to admit for IV diuresis.   Assessment & Plan:   Acute on chronic diastolic congestive heart failure  - pt with evidence of volume overload, still on IV Lasix 40 mg IV.  - not sure how accurate her weight is but based on hospital checks, it has been trending up.  - I am also concerned about her Cr trending up. I will hold lasix today and see how Cr looks in AM - cardiology has not been consulted to this point. No cardiology on call at [facility] today and pt does not want to be transferred to [facility]. - I think it is reasonable to keep her here for now and will get cardiology to see pt in AM.  - ACEI on hold due to worsening renal injury. Monitor urine output, strict I/O, daily weights  - please see below weight trend since admission: Filed Weights   11/29/16 0451 11/30/16 0400 12/01/16 0538  Weight: (!) 142.5 kg (314 lb 1.6 oz) (!) 142.7 kg (314 lb 8 oz) (!) 142.7 kg (314 lb 11.2 oz)   Diabetes with complications of nephropathy, PVD - on Humalog at home, has been substituted with Novolog here - continue to monitor  HTN, essential - reasonable inpatient control for now  Obstructive sleep apnea - has been non compliant at home - will need CPAP on discharge   Candidiasis, breast - keep on Diflucan  Hypomagnesemia - check this AM as it was not checked since admission - supplement if needed and follow   HLD - continue statin   Fall - SNF recommended, placement in progress  CKD stage III - Cr trending up - will hold Lasix today - repeat BMP In AM - monitor I/O  Morbid obesity - Body mass index is 45.15 kg/m.  DVT prophylaxis: Lovenox Code Status: Full code Family  Communication: family at bedside  Disposition Plan: SNF in 2-3 days   Consultants:   Cardiology in AM  Procedures:   None  Antimicrobials:    Diflucan   Subjective: Eating breakfast this AM. Reports feeling better but still with exertional dyspnea.   Objective: Vitals:   11/29/16 2022 11/30/16 0400 11/30/16 0500 11/30/16 1447  BP: (!) 151/85  (!) 138/100 (!) 120/53  Pulse: 61  (!) 54 (!) 58  Resp: 16  16 16   Temp: 98.2 F (36.8 C)  98.5 F (36.9 C) 98 F (36.7 C)  TempSrc: Oral  Oral Oral  SpO2: 98%  98% 100%  Weight:  (!) 142.7 kg (314 lb 8 oz)    Height:        Intake/Output Summary (Last 24 hours) at 11/30/16 1527 Last data filed at 11/30/16 0900  Gross per 24 hour  Intake              243 ml  Output              550 ml  Net             -307 ml   Filed Weights   11/28/16 1631 11/29/16 0451 11/30/16 0400  Weight: 136.1 kg (300 lb) (!) 142.5 kg (314 lb  1.6 oz) (!) 142.7 kg (314 lb 8 oz)    Physical Exam  Constitutional: Appears calms, NAD, morbidly obese  CVS: RRR, S1/S2 +, no gallops, no carotid bruit.  Pulmonary: wheezing in lower lobes, crackles at bases, diminished air movement  Abdominal: Soft. BS +,  no distension, tenderness, rebound or guarding.  Musculoskeletal: +1 left LE edema, right BKA (has prosthesis) Neuro: Alert. No cranial nerve deficit.  Data Reviewed: I have personally reviewed following labs and imaging studies  CBC:  Recent Labs Lab 11/28/16 1225  WBC 6.7  NEUTROABS 4.6  HGB 9.7*  HCT 30.9*  MCV 87.3  PLT 884   Basic Metabolic Panel:  Recent Labs Lab 11/28/16 1225 11/29/16 0559 11/30/16 0537  NA 145 141 139  K 3.9 4.3 4.5  CL 111 109 106  CO2 27 26 26   GLUCOSE 114* 92 75  BUN 29* 29* 33*  CREATININE 1.47* 1.52* 1.74*  CALCIUM 7.7* 7.5* 7.6*  MG 1.5*  --   --    Liver Function Tests:  Recent Labs Lab 11/28/16 1225  AST 20  ALT 16  ALKPHOS 85  BILITOT 0.6  PROT 5.8*  ALBUMIN 2.2*   Cardiac  Enzymes:  Recent Labs Lab 11/28/16 1225  TROPONINI 0.03*   CBG:  Recent Labs Lab 11/30/16 0326 11/30/16 0459 11/30/16 0737 11/30/16 0919 11/30/16 1111  GLUCAP 81 77 69 136* 155*    Radiology Studies: Dg Chest 1 View Result Date: 11/28/2016 Cardiomegaly with vascular congestion and worsening interstitial prominence, likely interstitial edema.   Dg Shoulder Left Result Date: 11/28/2016 No acute bony abnormality.  Mild degenerative changes.   Scheduled Meds: . cilostazol  100 mg Oral BID  . DULoxetine  60 mg Oral q morning - 10a  . enoxaparin (LOVENOX) injection  40 mg Subcutaneous Q24H  . fluconazole  100 mg Oral Daily  . [START ON 12/01/2016] furosemide  40 mg Intravenous Daily  . gabapentin  400 mg Oral TID  . insulin aspart  0-5 Units Subcutaneous QHS  . insulin aspart  0-9 Units Subcutaneous TID WC  . insulin aspart protamine- aspart  10 Units Subcutaneous BID WC  . ipratropium-albuterol  3 mL Nebulization Q6H  . metoprolol tartrate  50 mg Oral BID  . nystatin   Topical TID  . potassium chloride  10 mEq Oral Daily  . pravastatin  40 mg Oral Daily  . rOPINIRole  2 mg Oral QPM  . sodium chloride flush  3 mL Intravenous Q12H   Continuous Infusions: . sodium chloride       LOS: 2 days    Time spent: 35 minutes   Faye Ramsay, MD  [facility] Pager [phone]  If 7PM-7AM, please contact night-coverage [email or web address] Password [email or web address] 11/30/2016, 3:27 PM

## 2016-11-30 NOTE — Progress Notes (Signed)
No CPAP being used at this time.

## 2016-11-30 NOTE — Clinical Social Work Note (Signed)
Clinical Social Work Assessment  Patient Details  Name: Mary [name] MRN: [id number] Date of Birth: 27-Jan-1946  Date of referral:  11/30/16               Reason for consult:  Discharge Planning                Permission sought to share information with:    Permission granted to share information::     Name::        Agency::     Relationship::     Contact Information:     Housing/Transportation Living arrangements for the past 2 months:  Single Family Home Source of Information:  Patient Patient Interpreter Needed:  None Criminal Activity/Legal Involvement Pertinent to Current Situation/Hospitalization:    Significant Relationships:  Spouse Lives with:  Spouse Do you feel safe going back to the place where you live?  Yes Need for family participation in patient care:  Yes (Comment)  Care giving concerns:  None identified by patient.    Social Worker assessment / plan:   At baseline patient ambulates with a walker. She lives with her husband and completes ADLs independently (sponge baths). Patient wants [facility] or [facility]. Patient is not agreeable to go to facilities in [place]. Patient declined by [facility] and [facility] does not accept her insurance. Patient does not have financial means to pay $25 copay for HHPT per visit. Patient does not have financial means to pay $15 copay for PT at outpatient office. Patient request that PT provide her with a list of exercises she can do at home.  LCSW signing off.   Employment status:  Retired Nurse, adult PT Recommendations:  [place] / Referral to community resources:  [place]  Patient/Family's Response to care: Patient is not agreeable to go to SNF unless it is [facility], which declined her.   Patient/Family's Understanding of and Emotional Response to Diagnosis, Current Treatment, and Prognosis:  Patient understands her diagnosis, treatment and prognosis.   Emotional  Assessment Appearance:  Appears stated age Attitude/Demeanor/Rapport:    Affect (typically observed):  Accepting, Calm Orientation:  Oriented to Self, Oriented to Place, Oriented to  Time, Oriented to Situation Alcohol / Substance use:  Not Applicable Psych involvement (Current and /or in the community):  No (Comment)  Discharge Needs  Concerns to be addressed:  Discharge Planning Concerns Readmission within the last 30 days:  No Current discharge risk:  Dependent with Mobility Barriers to Discharge:  Insurance Authorization   Ihor Gully, LCSW 11/30/2016, 2:14 PM

## 2016-11-30 NOTE — Care Management Note (Signed)
Case Management Note  Patient Details  Name: Mary [name] MRN: [id number] Date of Birth: 10-Apr-1946  Subjective/Objective:  Adm with CHF exacerbationn. From home with husband. Has RW PTA. Recommended for SNF. Patient agreeable.                  Action/Plan: CSW aware and making arrangements.  Expected Discharge Date:       12/01/2016           Expected Discharge Plan:  [place]  In-House Referral:     Discharge planning Services  CM Consult  Post Acute Care Choice:    Choice offered to:     DME Arranged:    DME Agency:     HH Arranged:    HH Agency:     Status of Service:  In process, will continue to follow  If discussed at Long Length of Stay Meetings, dates discussed:    Additional Comments:  Tiye Huwe, Chauncey Reading, RN 11/30/2016, 2:05 PM

## 2016-11-30 NOTE — Progress Notes (Signed)
CRITICAL VALUE ALERT  Critical Value:  CBG 69  Date & Time Notied:  11/30/2016 0730 with ac CBG  Provider Notified: Dr. Roderic Palau was notified at [phone] and also that D50 along with her breakfast was given.  Updated MD of the patients progress.  Updated CBG was 126.  During the episode the patient did seem minimally confused but overall was alert and had no complaints at the time.  She is now sitting up in the chair in stable condition.  Orders Received/Actions taken:  D50 given, held am dose of 70/30 and sliding scale.  CBG rechecked.  MD ok with hold the am doses of insulin.

## 2016-11-30 NOTE — NC FL2 (Signed)
[place] LEVEL OF CARE SCREENING TOOL     IDENTIFICATION  Patient Name: Mary [name] Birthdate: [birth year] Sex: female Admission Date (Current Location): 11/28/2016  [facility] and [place] Number:  [employer] and Address:  [place] [address], [place]      Provider Number: [id number]  Attending Physician Name and Address:  Kathie Dike, MD  Relative Name and Phone Number:       Current Level of Care: Hospital Recommended Level of Care: [place] Prior Approval Number:    Date Approved/Denied:   PASRR Number: [id number] ([id number])  Discharge Plan: SNF    Current Diagnoses: Patient Active Problem List   Diagnosis Date Noted  . CHF exacerbation ([place]) 11/28/2016  . Fall 11/28/2016  . Acute on chronic diastolic CHF (congestive heart failure) ([place]) 11/28/2016  . Elevated troponin 07/19/2015  . Diastolic CHF, acute on chronic (HCC) 01/13/2014  . Expressive aphasia 01/13/2014  . CHF (congestive heart failure) ([place])   . Unstable balance 01/16/2013  . Difficulty in walking(719.7) 01/16/2013  . Infected wound 01/16/2013  . Hx of right BKA ([place]) 12/23/2012  . Candidiasis of breast 12/08/2012  . S/P bilateral BKA (below knee amputation) ([place]) 11/30/2012  . Diabetes ([place]) 11/30/2012  . Diabetic foot ulcer ([place]) 10/08/2012  . Anxiety 02/17/2012  . Osteomyelitis of right foot ([place]) 02/14/2012  . Hematuria, microscopic 02/13/2012  . PVD (peripheral vascular disease) ([place]) 02/08/2012  . Open wound of heel 02/08/2012  . Hyperlipidemia 11/11/2011  . Hypertension 11/11/2011  . Morbid obesity ([place]) 11/11/2011  . Tachycardia 08/04/2011  . Acute on chronic diastolic heart failure ([place]) 07/25/2011  . Diabetes mellitus with neuropathy 07/25/2011  . OSA (obstructive sleep apnea) 07/25/2011  . Generalized weakness 07/25/2011  . Acute respiratory failure ([place]) 07/25/2011  . Restless leg syndrome  07/25/2011  . Anemia 07/25/2011    Orientation RESPIRATION BLADDER Height & Weight     Self, Time, Situation, Place  O2 (2L) Incontinent Weight: (!) 314 lb 8 oz (142.7 kg) Height:  5\' 10"  (177.8 cm)  BEHAVIORAL SYMPTOMS/MOOD NEUROLOGICAL BOWEL NUTRITION STATUS      Incontinent Diet (Heart Healthy/Carb Modified)  AMBULATORY STATUS COMMUNICATION OF NEEDS Skin     Verbally Normal                       Personal Care Assistance Level of Assistance  Bathing, Feeding, Dressing Bathing Assistance: Maximum assistance Feeding assistance: Independent Dressing Assistance: Maximum assistance     Functional Limitations Info  Sight, Hearing, Speech Sight Info: Adequate Hearing Info: Adequate Speech Info: Adequate    SPECIAL CARE FACTORS FREQUENCY  PT (By licensed PT)     PT Frequency: 5x/week              Contractures Contractures Info: Not present    Additional Factors Info  Psychotropic Code Status Info: Full Code   Psychotropic Info: Cymbalta         Current Medications (11/30/2016):  This is the current hospital active medication list Current Facility-Administered Medications  Medication Dose Route Frequency Provider Last Rate Last Dose  . 0.9 %  sodium chloride infusion  250 mL Intravenous PRN Kathie Dike, MD      . acetaminophen (TYLENOL) tablet 650 mg  650 mg Oral Q4H PRN Kathie Dike, MD      . cilostazol (PLETAL) tablet 100 mg  100 mg Oral BID Kathie Dike, MD   100 mg at  11/30/16 6073  . DULoxetine (CYMBALTA) DR capsule 60 mg  60 mg Oral q morning - 10a Kathie Dike, MD   60 mg at 11/30/16 0839  . enoxaparin (LOVENOX) injection 40 mg  40 mg Subcutaneous Q24H Kathie Dike, MD   40 mg at 11/29/16 2027  . fluconazole (DIFLUCAN) tablet 100 mg  100 mg Oral Daily Kathie Dike, MD   100 mg at 11/30/16 0838  . furosemide (LASIX) injection 40 mg  40 mg Intravenous BID Kathie Dike, MD   40 mg at 11/30/16 0840  . gabapentin (NEURONTIN) capsule 400  mg  400 mg Oral TID Kathie Dike, MD   400 mg at 11/30/16 0839  . insulin aspart (novoLOG) injection 0-5 Units  0-5 Units Subcutaneous QHS Kathie Dike, MD   5 Units at 11/28/16 2102  . insulin aspart (novoLOG) injection 0-9 Units  0-9 Units Subcutaneous TID WC Kathie Dike, MD   2 Units at 11/30/16 1151  . insulin aspart protamine- aspart (NOVOLOG MIX 70/30) injection 10 Units  10 Units Subcutaneous BID WC Memon, Jolaine Artist, MD      . metoprolol tartrate (LOPRESSOR) tablet 50 mg  50 mg Oral BID Kathie Dike, MD   50 mg at 11/30/16 0840  . nystatin (MYCOSTATIN/NYSTOP) topical powder   Topical TID Kathie Dike, MD      . ondansetron (ZOFRAN) injection 4 mg  4 mg Intravenous Q6H PRN Kathie Dike, MD      . potassium chloride (K-DUR,KLOR-CON) CR tablet 10 mEq  10 mEq Oral Daily Kathie Dike, MD   10 mEq at 11/30/16 0839  . pravastatin (PRAVACHOL) tablet 40 mg  40 mg Oral Daily Kathie Dike, MD   40 mg at 11/30/16 0840  . rOPINIRole (REQUIP) tablet 2 mg  2 mg Oral QPM Kathie Dike, MD   2 mg at 11/29/16 2027  . sodium chloride flush (NS) 0.9 % injection 3 mL  3 mL Intravenous Q12H Kathie Dike, MD   3 mL at 11/30/16 0841  . sodium chloride flush (NS) 0.9 % injection 3 mL  3 mL Intravenous PRN Kathie Dike, MD      . traMADol (ULTRAM) tablet 50 mg  50 mg Oral Q6H PRN Kathie Dike, MD   50 mg at 11/29/16 1558     Discharge Medications: Please see discharge summary for a list of discharge medications.  Relevant Imaging Results:  Relevant Lab Results:   Additional Information SSN [id number]  [name] D, LCSW

## 2016-11-30 NOTE — Progress Notes (Signed)
PT glucose has been on the lower end all day on 11/29/16. PT glucose was 64. PT given OJ with sugar, peanut butter and ice cream. Glucose 56. Paged MD, stated to follow hypoglycemia orders. PT lethargic and sleepy, unable to tolerate oral methods. 12.5g of Dextrose given IV. Glucose then 65 and later 106. 2200 dose of Novolog not given. Continue to monitor.

## 2016-12-01 LAB — GLUCOSE, CAPILLARY
GLUCOSE-CAPILLARY: 98 mg/dL (ref 65–99)
Glucose-Capillary: 134 mg/dL — ABNORMAL HIGH (ref 65–99)
Glucose-Capillary: 192 mg/dL — ABNORMAL HIGH (ref 65–99)
Glucose-Capillary: 122 mg/dL — ABNORMAL HIGH (ref 65–99)

## 2016-12-01 LAB — BASIC METABOLIC PANEL
Anion gap: 8 (ref 5–15)
BUN: 37 mg/dL — AB (ref 6–20)
CO2: 26 mmol/L (ref 22–32)
CREATININE: 2.16 mg/dL — AB (ref 0.44–1.00)
Calcium: 7.6 mg/dL — ABNORMAL LOW (ref 8.9–10.3)
Chloride: 104 mmol/L (ref 101–111)
GFR calc non Af Amer: 22 mL/min — ABNORMAL LOW (ref >60)
GFR, EST AFRICAN AMERICAN: 25 mL/min — AB (ref >60)
Glucose, Bld: 115 mg/dL — ABNORMAL HIGH (ref 65–99)
Potassium: 4.7 mmol/L (ref 3.5–5.1)
SODIUM: 138 mmol/L (ref 135–145)

## 2016-12-01 LAB — MAGNESIUM: Magnesium: 1.9 mg/dL (ref 1.7–2.4)

## 2016-12-01 MED ORDER — HYDROCODONE-ACETAMINOPHEN 5-325 MG PO TABS
1.0000 | ORAL_TABLET | Freq: Four times a day (QID) | ORAL | Status: DC | PRN
  Administered 2016-12-01: 1 via ORAL
  Filled 2016-12-01: qty 1

## 2016-12-01 NOTE — Clinical Social Work Note (Signed)
Per CM, pt requesting information be sent to [facility]. Referral sent on hub.  Benay Pike, [place]

## 2016-12-01 NOTE — Care Management (Signed)
CM discussed discharge plan at length with patient and spouse. Patient aware her two first choices for SNF are not available and is agreeable to having [facility] review her information. Patient is not agreeable to any other facilities and wants to go home if [facility] declines patient.

## 2016-12-02 ENCOUNTER — Inpatient Hospital Stay (HOSPITAL_COMMUNITY): Payer: PPO

## 2016-12-02 DIAGNOSIS — Z9114 Patient's other noncompliance with medication regimen: Secondary | ICD-10-CM

## 2016-12-02 DIAGNOSIS — I360 Nonrheumatic tricuspid (valve) stenosis: Secondary | ICD-10-CM

## 2016-12-02 DIAGNOSIS — N183 Chronic kidney disease, stage 3 (moderate): Secondary | ICD-10-CM

## 2016-12-02 DIAGNOSIS — R0602 Shortness of breath: Secondary | ICD-10-CM

## 2016-12-02 DIAGNOSIS — E8809 Other disorders of plasma-protein metabolism, not elsewhere classified: Secondary | ICD-10-CM

## 2016-12-02 LAB — ECHOCARDIOGRAM COMPLETE
AVLVOTPG: 4 mmHg
CHL CUP MV DEC (S): 261
CHL CUP STROKE VOLUME: 33 mL
E decel time: 261 msec
E/e' ratio: 20.21
FS: 19 % — AB (ref 28–44)
Height: 70 in
IV/PV OW: 0.86
LA ID, A-P, ES: 41 mm
LA vol: 84.7 mL
LADIAMINDEX: 1.49 cm/m2
LAVOLA4C: 89.8 mL
LAVOLIN: 30.8 mL/m2
LEFT ATRIUM END SYS DIAM: 41 mm
LV E/e'average: 20.21
LV PW d: 15.7 mm — AB (ref 0.6–1.1)
LV SIMPSON'S DISK: 36
LV TDI E'LATERAL: 5.59
LV dias vol index: 33 mL/m2
LV dias vol: 92 mL (ref 46–106)
LV e' LATERAL: 5.59 cm/s
LV sys vol index: 21 mL/m2
LV sys vol: 59 mL — AB (ref 14–42)
LVEEMED: 20.21
LVOT VTI: 24.2 cm
LVOT area: 3.14 cm2
LVOT diameter: 20 mm
LVOT peak vel: 101 cm/s
LVOTSV: 76 mL
MVPG: 5 mmHg
MVPKAVEL: 87.5 m/s
MVPKEVEL: 113 m/s
RV sys press: 47 mmHg
Reg peak vel: 282 cm/s
TDI e' medial: 3.73
TRMAXVEL: 282 cm/s
VTI: 143 cm
Weight: 5115.2 oz

## 2016-12-02 LAB — BASIC METABOLIC PANEL
ANION GAP: 6 (ref 5–15)
ANION GAP: 8 (ref 5–15)
BUN: 41 mg/dL — AB (ref 6–20)
BUN: 43 mg/dL — ABNORMAL HIGH (ref 6–20)
CHLORIDE: 104 mmol/L (ref 101–111)
CHLORIDE: 102 mmol/L (ref 101–111)
CO2: 28 mmol/L (ref 22–32)
CO2: 25 mmol/L (ref 22–32)
CREATININE: 2.18 mg/dL — AB (ref 0.44–1.00)
Calcium: 7.6 mg/dL — ABNORMAL LOW (ref 8.9–10.3)
Calcium: 7.5 mg/dL — ABNORMAL LOW (ref 8.9–10.3)
Creatinine, Ser: 2.2 mg/dL — ABNORMAL HIGH (ref 0.44–1.00)
GFR calc non Af Amer: 22 mL/min — ABNORMAL LOW (ref >60)
GFR, EST AFRICAN AMERICAN: 25 mL/min — AB (ref >60)
GFR, EST AFRICAN AMERICAN: 25 mL/min — AB (ref >60)
GFR, EST NON AFRICAN AMERICAN: 21 mL/min — AB (ref >60)
Glucose, Bld: 90 mg/dL (ref 65–99)
Glucose, Bld: 146 mg/dL — ABNORMAL HIGH (ref 65–99)
POTASSIUM: 5.3 mmol/L — AB (ref 3.5–5.1)
Potassium: 5 mmol/L (ref 3.5–5.1)
SODIUM: 138 mmol/L (ref 135–145)
SODIUM: 135 mmol/L (ref 135–145)

## 2016-12-02 LAB — CBC
HEMATOCRIT: 33.2 % — AB (ref 36.0–46.0)
Hemoglobin: 10.1 g/dL — ABNORMAL LOW (ref 12.0–15.0)
MCH: 27.3 pg (ref 26.0–34.0)
MCHC: 30.4 g/dL (ref 30.0–36.0)
MCV: 89.7 fL (ref 78.0–100.0)
Platelets: 276 10*3/uL (ref 150–400)
RBC: 3.7 MIL/uL — AB (ref 3.87–5.11)
RDW: 16.2 % — AB (ref 11.5–15.5)
WBC: 6.5 10*3/uL (ref 4.0–10.5)

## 2016-12-02 LAB — GLUCOSE, CAPILLARY
GLUCOSE-CAPILLARY: 107 mg/dL — AB (ref 65–99)
GLUCOSE-CAPILLARY: 108 mg/dL — AB (ref 65–99)
GLUCOSE-CAPILLARY: 135 mg/dL — AB (ref 65–99)
Glucose-Capillary: 174 mg/dL — ABNORMAL HIGH (ref 65–99)

## 2016-12-02 LAB — URINALYSIS, ROUTINE W REFLEX MICROSCOPIC
Bilirubin Urine: NEGATIVE
GLUCOSE, UA: NEGATIVE mg/dL
Ketones, ur: NEGATIVE mg/dL
NITRITE: NEGATIVE
PH: 5 (ref 5.0–8.0)
Protein, ur: 100 mg/dL — AB
SPECIFIC GRAVITY, URINE: 1.008 (ref 1.005–1.030)

## 2016-12-02 LAB — MAGNESIUM: Magnesium: 1.9 mg/dL (ref 1.7–2.4)

## 2016-12-02 MED ORDER — FERROUS SULFATE 325 (65 FE) MG PO TABS
325.0000 mg | ORAL_TABLET | Freq: Every day | ORAL | Status: DC
  Administered 2016-12-02 – 2016-12-06 (×5): 325 mg via ORAL
  Filled 2016-12-02 (×5): qty 1

## 2016-12-02 MED ORDER — ENOXAPARIN SODIUM 80 MG/0.8ML ~~LOC~~ SOLN: 70.0000 mg | SUBCUTANEOUS | Status: DC

## 2016-12-02 MED ORDER — HEPARIN SODIUM (PORCINE) 5000 UNIT/ML IJ SOLN
5000.0000 [IU] | Freq: Three times a day (TID) | INTRAMUSCULAR | Status: DC
  Administered 2016-12-02 – 2016-12-06 (×12): 5000 [IU] via SUBCUTANEOUS
  Filled 2016-12-02 (×12): qty 1

## 2016-12-02 MED ORDER — FUROSEMIDE 10 MG/ML IJ SOLN
60.0000 mg | Freq: Two times a day (BID) | INTRAMUSCULAR | Status: DC
  Administered 2016-12-02: 60 mg via INTRAVENOUS
  Filled 2016-12-02: qty 6

## 2016-12-02 MED ORDER — FUROSEMIDE 10 MG/ML IJ SOLN
40.0000 mg | Freq: Two times a day (BID) | INTRAMUSCULAR | Status: DC
  Administered 2016-12-02 – 2016-12-06 (×8): 40 mg via INTRAVENOUS
  Filled 2016-12-02 (×8): qty 4

## 2016-12-02 MED ORDER — DEXTROSE 5 % IV SOLN
1.0000 g | INTRAVENOUS | Status: DC
  Administered 2016-12-02 – 2016-12-05 (×4): 1 g via INTRAVENOUS
  Filled 2016-12-02 (×6): qty 10

## 2016-12-02 NOTE — Progress Notes (Signed)
*  PRELIMINARY RESULTS* Echocardiogram 2D Echocardiogram has been performed.  Mary [name] 12/02/2016, 2:36 PM

## 2016-12-02 NOTE — Progress Notes (Signed)
Physical Therapy Treatment Patient Details Name: Mary [name] MRN: [id number] DOB: 06-20-1945 Today's Date: 12/02/2016    History of Present Illness 71 yo female with 7-8 falls in the last week was admitted, hs possible MI and added 40# fluid to her system, CHF and cardiomegaly.  PMHx:  CHF, DM, OSA, obesity    PT Comments    Pt is significantly more able to move today, expressing motivation to get to rehab and get home.  Pt is now wearing a prosthesis that is stable which makes her able to maneuver a few steps on the RW bedside.  Her plan is to transition to SNF which she has done before, then home with husband.  Her progress today bodes well for a good course of PT at SNF then home.  Needs to get up to the chair next PT visit.   Follow Up Recommendations  SNF     Equipment Recommendations  None recommended by PT    Recommendations for Other Services       Precautions / Restrictions Precautions Precautions: Fall (telemetry) Required Braces or Orthoses: Other Brace/Splint (R BK prosthesis) Restrictions Weight Bearing Restrictions: No    Mobility  Bed Mobility Overal bed mobility: Needs Assistance Bed Mobility: Supine to Sit;Sit to Supine     Supine to sit: Mod assist Sit to supine: Mod assist   General bed mobility comments: assist to lift legs onto bed, assist for getting trunk off the bed with RLE prosthesis on in the bed, has enough socks to use RLE  Transfers Overall transfer level: Needs assistance Equipment used: Rolling walker (2 wheeled) Transfers: Sit to/from Stand Sit to Stand: Mod assist         General transfer comment: pt used the walker to stand due to weakness in UE's to push off bed but successful  Ambulation/Gait Ambulation/Gait assistance: Min assist Ambulation Distance (Feet): 4 Feet Assistive device: Rolling walker (2 wheeled);1 person hand held assist Gait Pattern/deviations: Step-to pattern;Decreased stride length;Decreased weight  shift to right;Shuffle;Wide base of support;Trunk flexed Gait velocity: reduced Gait velocity interpretation: Below normal speed for age/gender General Gait Details: pt is progressing significantly from last visit, able to stand up and sidestep twice, total of 4'   Stairs            Wheelchair Mobility    Modified Rankin (Stroke Patients Only)       Balance Overall balance assessment: Needs assistance Sitting-balance support: Feet supported;Bilateral upper extremity supported Sitting balance-Leahy Scale: Fair     Standing balance support: Bilateral upper extremity supported Standing balance-Leahy Scale: Poor                              Cognition Arousal/Alertness: Awake/alert Behavior During Therapy: WFL for tasks assessed/performed Overall Cognitive Status: Within Functional Limits for tasks assessed                                 General Comments: Pt is more engaged with PT today, able to initiate more with standing and scooting up the bed      Exercises      General Comments General comments (skin integrity, edema, etc.): More alert today, engaged with PT to stand up and actually focusing on lifting legs to sidestep bedside      Pertinent Vitals/Pain Pain Assessment: Faces Faces Pain Scale: Hurts little more Pain Location: joint pain  Pain Intervention(s): Monitored during session;Premedicated before session;Repositioned    Home Living                      Prior Function            PT Goals (current goals can now be found in the care plan section) Acute Rehab PT Goals Patient Stated Goal: to be able to walk again Progress towards PT goals: Progressing toward goals    Frequency    Min 3X/week      PT Plan Current plan remains appropriate    Co-evaluation              AM-PAC PT "6 Clicks" Daily Activity  Outcome Measure  Difficulty turning over in bed (including adjusting bedclothes, sheets and  blankets)?: A Lot Difficulty moving from lying on back to sitting on the side of the bed? : A Lot Difficulty sitting down on and standing up from a chair with arms (e.g., wheelchair, bedside commode, etc,.)?: A Lot Help needed moving to and from a bed to chair (including a wheelchair)?: A Lot Help needed walking in hospital room?: A Lot Help needed climbing 3-5 steps with a railing? : Total 6 Click Score: 11    End of Session Equipment Utilized During Treatment: Gait belt Activity Tolerance: Patient limited by fatigue;Other (comment) (LE strength) Patient left: in bed;with call bell/phone within reach;with bed alarm set Nurse Communication: Mobility status PT Visit Diagnosis: Repeated falls (R29.6);Muscle weakness (generalized) (M62.81);Difficulty in walking, not elsewhere classified (R26.2)     Time: 4128-2081 PT Time Calculation (min) (ACUTE ONLY): 26 min  Charges:  $Gait Training: 8-22 mins $Therapeutic Activity: 8-22 mins                    G Codes:  Functional Assessment Tool Used: AM-PAC 6 Clicks Basic Mobility    Ramond Dial 12/02/2016, 4:23 PM   4:26 PM, 12/02/16 Mee Hives, PT, MS Physical Therapist - [place] [phone] (Office)

## 2016-12-02 NOTE — Consult Note (Signed)
Cardiology Consultation:   [name] ID: Mary [name]; [id number]; 11-11-1945   Admit date: 11/28/2016 Date of Consult: 12/02/2016  Primary Care Provider: Celene Squibb, MD Primary Cardiologist: [facility]   [name] Profile:   Mary [name] is a 71 y.o. female with a hx of chronic diastolic CHF, hypertension, hyperlipidemia,PSVT,Morbid obesity is acute respiratory failure, frequent falls,  OSA, but does not use CPAP(states it was never ordered for her)  , and chronic dyspnea who is being seen today for Mary evaluation of diastolic CHF at Mary request of Dr. Rebecca Eaton, Hospitalist service   History of Present Illness:   Mary [name] Presented to Mary emergency room with complaints of fall after walking in her home with a walker. Mary [name] did not loose consciousness. On arrival to Mary emergency room Mary [name] was found to be aggressively short of breath, and complained of worsening shortness of breath over Mary last 3-4 weeks. Mary [name] also noted worsening edema in her lower extremities abdomen, with weight gain. Most recent weight prior to admission was 270 pounds, that Mary [name] is now noted to be greater than 300 pounds on admission.  Mary [name] states her fall was related to getting her foot tangled around her walker while getting out of bed. That Mary [name] has had falls in Mary past where Mary [name] feels her left leg becoming weak and not supporting her. Mary [name] denies medical noncompliance or eating salted foods at home.  On arrival to Mary emergency room Mary [name]'s blood pressure was 159/95, heart rate 92, O2 sat 88% on room air, Mary [name] was afebrile. Pertinent labs revealed a BUN of 29 creatinine 1.47, protein 5.8, albumin 2.2. Found to be anemic with a hemoglobin of 9.7 with hematocrit of 30.9 (most recent CBC revealed hemoglobin 9.9 with hematocrit of 32.1 in November 2017). Troponin was found be 0.03, and was not repeated. BNP 1485. Chest x-ray revealed cardiomegaly with vascular congestion and worsening interstitial prominence, likely  interstitial edema. Mary [name] was treated with IV Lasix 40 mg 1, and nitroglycerin 2% 1 inch topically. EKG revealed normal sinus rhythm, sinus tachycardia heart rate 90/m. No acute ST-T wave abnormalities  Has been placed on IV Lasix 40 mg daily, with urine output of 1144 cc since admission. Review of home medications has her on Lasix 20 mg twice a day by mouth. We are asked for cardiology recommendations for either races as hospitalist service states that Mary [name] is continuing to gain weight with very little diuresis. Review of weight since admission on 11/29/2016 weight 314 pounds today's weight 319 pounds. Creatinine is rising to 2.20 from admission of 1.47.    Past Medical History:  Diagnosis Date  . Anemia   . Arthritis    knees  . Cellulitis and abscess of foot 02/12/2012  . CHF (congestive heart failure) ([place])   . Complication of anesthesia    pt states after her hysterectomy Mary doctor said Mary [name] was "wild" when Mary [name] woke up   . Diabetes mellitus   . Diabetes mellitus with neuropathy 07/25/2011  . Dysrhythmia    tachy- takes Metoprolol  . OSA (obstructive sleep apnea) 07/25/2011   not using CPAP  . Osteomyelitis of right foot ([place]) 02/14/2012  . Partial Achilles tendon tear 02/14/2012  . Restless leg syndrome 07/25/2011  . Wound, open    right foot    Past Surgical History:  Procedure Laterality Date  . ABDOMINAL HYSTERECTOMY    . ACHILLES TENDON REPAIR    . AMPUTATION Right 11/25/2012   Procedure: AMPUTATION BELOW KNEE;  Surgeon: Newt Minion, MD;  Location: [facility] [place];  Service: Orthopedics;  Laterality: Right;  Right Below Knee Amputation  . BELOW KNEE LEG AMPUTATION Right 11/25/2012   Dr Sharol Given  . BREAST SURGERY Left    Lumpectomy non ca  . CATARACT EXTRACTION W/PHACO Right 10/09/2013   Procedure: CATARACT EXTRACTION PHACO AND INTRAOCULAR LENS PLACEMENT (IOC);  Surgeon: Elta Guadeloupe T. Gershon Crane, MD;  Location: [facility];  Service: Ophthalmology;  Laterality: Right;  CDE:9.05  . CATARACT EXTRACTION  W/PHACO Left 10/23/2013   Procedure: ATTEMPTED CATARACT EXTRACTION PHACO AND INTRAOCULAR LENS PLACEMENT;  Surgeon: Elta Guadeloupe T. Gershon Crane, MD;  Location: [facility];  Service: Ophthalmology;  Laterality: Left;  CDE:  4.41  . TOE AMPUTATION     partial rt great toe     Inpatient Medications: Scheduled Meds: . cilostazol  100 mg Oral BID  . DULoxetine  60 mg Oral q morning - 10a  . enoxaparin (LOVENOX) injection  70 mg Subcutaneous Q24H  . fluconazole  100 mg Oral Daily  . furosemide  40 mg Intravenous Daily  . gabapentin  400 mg Oral TID  . insulin aspart  0-5 Units Subcutaneous QHS  . insulin aspart  0-9 Units Subcutaneous TID WC  . insulin aspart protamine- aspart  10 Units Subcutaneous BID WC  . metoprolol tartrate  50 mg Oral BID  . nystatin   Topical TID  . pravastatin  40 mg Oral Daily  . rOPINIRole  2 mg Oral QPM  . sodium chloride flush  3 mL Intravenous Q12H   Continuous Infusions: . sodium chloride     PRN Meds: sodium chloride, acetaminophen, HYDROcodone-acetaminophen, ipratropium-albuterol, ondansetron (ZOFRAN) IV, sodium chloride flush, traMADol  Allergies:   No Known Allergies  Social History:   Social History   Social History  . Marital status: Married    Spouse name: N/A  . Number of children: N/A  . Years of education: N/A   Occupational History  . Not on file.   Social History Main Topics  . Smoking status: Never Smoker  . Smokeless tobacco: Never Used  . Alcohol use No  . Drug use: No  . Sexual activity: No   Other Topics Concern  . Not on file   Social History Narrative  . No narrative on file    Family History:   Mary [name]'s family history includes Diabetes in her sister.  ROS:  Please see Mary history of present illness.  ROS  All other ROS reviewed and negative.     Physical Exam/Data:   Vitals:   11/30/16 2139 12/01/16 0538 12/01/16 1300 12/02/16 0419  BP: 100/78 (!) 117/55 (!) 137/57 115/83  Pulse: 61 (!) 58 60 (!) 57  Resp: 16 16 18  18   Temp: (!) 97.5 F (36.4 C)  97.8 F (36.6 C) 98.3 F (36.8 C)  TempSrc: Oral  Oral Oral  SpO2: 98% 100% 100% 100%  Weight:  (!) 314 lb 11.2 oz (142.7 kg)  (!) 319 lb 11.2 oz (145 kg)  Height:        Intake/Output Summary (Last 24 hours) at 12/02/16 0757 Last data filed at 12/01/16 1300  Gross per 24 hour  Intake              360 ml  Output              200 ml  Net              160 ml   [employer]  11/30/16 0400 12/01/16 0538 12/02/16 0419  Weight: (!) 314 lb 8 oz (142.7 kg) (!) 314 lb 11.2 oz (142.7 kg) (!) 319 lb 11.2 oz (145 kg)   Body mass index is 45.87 kg/m.  General:  Well nourished, well developed, in no acute distress,morbidly obese. HEENT: normal Lymph: no adenopathy Neck: no JVD Endocrine:  No thryomegaly Vascular: No carotid bruits, neck obese; diminished on Mary left DP. AKA on Mary right.  Cardiac:  normal S1, S2; distant heart sounds, RRR; no murmur  Lungs:  Inspiratory wheezes, no coughing, diminished in Mary bases.  Abd: morbidly obese, distended, pannus is full Musculoskeletal: Right AKA, with prosthesis, left leg in brace with 2+ pitting edema, with diminished pulses.  Skin: warm and dry  Neuro:  CNs 2-12 intact, no focal abnormalities noted Psych:  Normal affect   EKG:  Mary EKG was personally reviewed and demonstrates:  NSR Telemetry:  Telemetry was personally reviewed and demonstrates:  NSR  Relevant CV Studies: Echocardiogram 05/05/2016 Left ventricle: Mary cavity size was normal. Wall thickness was   increased in a pattern of mild LVH. Systolic function was normal.   Mary estimated ejection fraction was in Mary range of 55% to 60%.   Abnormal diastolic function, indeterminate grade. There is   evidence of elevated LA pressure. Wall motion was normal; there   were no regional wall motion abnormalities. - Aortic valve: Valve area (VTI): 2.89 cm^2. Valve area (Vmax):   2.43 cm^2. Valve area (Vmean): 2.78 cm^2. - Left atrium: Mary atrium was  mildly dilated. - Atrial septum: No defect or patent foramen ovale was identified. - Pulmonary arteries: Systolic pressure was mildly increased. PA   peak pressure: 38 mm Hg (S). - Pericardium, extracardiac: Small to moderate circumferential   pericardial effusion. Most prominent adjacent to Mary right   atrium, measuring 1.2 cm in diastole. No evidence of tamponade   physiology by echo. - Technically adequate study.  Laboratory Data:  Chemistry Recent Labs Lab 11/30/16 0537 12/01/16 0626 12/02/16 0607  NA 139 138 138  K 4.5 4.7 5.3*  CL 106 104 104  CO2 26 26 28   GLUCOSE 75 115* 90  BUN 33* 37* 41*  CREATININE 1.74* 2.16* 2.20*  CALCIUM 7.6* 7.6* 7.6*  GFRNONAA 29* 22* 21*  GFRAA 33* 25* 25*  ANIONGAP 7 8 6      Recent Labs Lab 11/28/16 1225  PROT 5.8*  ALBUMIN 2.2*  AST 20  ALT 16  ALKPHOS 85  BILITOT 0.6   Hematology Recent Labs Lab 11/28/16 1225 12/02/16 0607  WBC 6.7 6.5  RBC 3.54* 3.70*  HGB 9.7* 10.1*  HCT 30.9* 33.2*  MCV 87.3 89.7  MCH 27.4 27.3  MCHC 31.4 30.4  RDW 16.1* 16.2*  PLT 266 276   Cardiac Enzymes Recent Labs Lab 11/28/16 1225  TROPONINI 0.03*    BNP Recent Labs Lab 11/28/16 1225  BNP 1,485.0*    Radiology/Studies:    Assessment and Plan:   1. Acute on Chronic Diastolic CHF: Mary [name] has noticed worsening edema, dyspnea on exertion, abdominal distention over Mary last several weeks. Denies any dietary or medical noncompliance. Her weight is up approximately 30 pounds since being seen in our office last. Mary [name] has been given daily IV Lasix only, will increase Lasix dose to 40 mg twice a day, also place on fluid restriction and low sodium diet.   We'll plan repeat echocardiogram for changes in LV function as last echocardiogram was completed in December 2017, with worsening  symptoms of dyspnea, and weight gain. We'll follow kidney function with increased dose of Lasix.  2. Anemia: This is chronic for her. Mary [name] is on iron  replacement at home. Would restart.  3. Hx of OSA: Not currently on CPAP, [name] states he was never ordered for her. This may need to be reevaluated and addressed.  4. Diabetes: Check Hgb A1C for evaluation of glucose control  5. Morbid Obesity: Severely limiting ambulation and breathing status.  6. Peripheral artery disease: [name] has right AKA, uses brace on Mary left with diabetic wound management. Leg is wrapped uncertain if wound is completely healed. Pulse is not palpable currently.  7 Frequent falls:. [name] states that most recent fall was due to her leg becoming tangled in her walker while getting out of bed, but often states that her leg on Mary left "gives out" causing her to lose her balance and fall to Mary floor. Discussion for transfer to rehabilitation center after Mary [name] is clinically stable for discharge has been discussed with her and Mary [name] is considering this.    Signed, Jory Sims, NP  12/02/2016 7:57 AM   Mary [name] was seen and examined, and I agree with Mary history, physical exam, assessment and plan as documented above, with modifications as noted below. I have also personally reviewed all relevant documentation, old records, labs, and both radiographic and cardiovascular studies. I have also independently interpreted old and new ECG's.  71 yr old woman who is morbidly obese and has obstructive sleep apnea but does not use CPAP, chronic diastolic heart failure, type 2 diabetes, PSVT, pericardial effusion, admitted with progressive shortness of breath and left leg swelling.  CXR showed CHF.  Last urinalysis I find from 03/2016 showed proteinuria. On ACEI at home. Albumin here very low at 2.2 on 11/28/16.  Troponin 0.03 x 1. BNP markedly elevated at 1485 on admission.  Denies chest pain. Last echocardiogram from 04/2016 reviewed.  ECG from 11/29/16 which I reviewed and personally interpreted showed sinus rhythm with late R wave transition, nonspecific T wave  abnormalities, and low voltage.  Was on IV Lasix 40 mg which was held yesterday due to rising BUN/creatinine (41/2.2 today).  Weight also 319 lbs today, 314 lbs on 7/2 (? Accuracy). Weight listed at 236 lbs at office visit on 07/05/16.  Assessment and Plan: I think this is multifactorial in etiology due to hypoalbuminemia resulting in low oncotic pressure resulting in 3rd spacing. I will check a UA (likely has diabetic proteinuria). Was on ACEI at home, currently on hold due to acute renal insufficiency.  I also think obstructive sleep apnea with CPAP noncompliance is contributing, in Mary context of morbid obesity and CKD.  Will obtain echocardiogram to assess both LV and RV function.  Will temporarily attempt a higher dose of IV Lasix.  Uncertain about weight accuracy but if correct, Mary [name] is up 83 lbs from 07/2016.  Physical exam notable for wheezing, no crackles. I will obtain a repeat chest xray as well.   Kate Sable, MD, [facility]  12/02/2016 9:18 AM

## 2016-12-02 NOTE — Progress Notes (Addendum)
PROGRESS NOTE    Mary [name]  WCB:[id number] DOB: 10/10/1945 DOA: 11/28/2016   PCP: Celene Squibb, MD   Brief Narrative:  Patient is 71 year old female with known diastolic CHF, presented to emergency department after an episode of fall. On admission, found to be dyspneic, volume overloaded. Symptoms thought to be related to acute on chronic exacerbation of CHF and [facility] was asked to admit for further evaluation and management.  Assessment & Plan:   Acute on chronic diastolic congestive heart failure  - Patient has been on Lasix 40 mg IV, twice a day - Her weight continues to trend up: 314 --> 319 lbs - Not sure how accurate weight checks are, using bed scale - per cardiology, last weight back in Feb 2018 was  236 lbs  - Cardiology was consulted and assistance is greatly appreciated - ACE inhibitor has been on hold given renal injury - My concern is that creatinine is continuing to trend up on Lasix, will need to monitor closely - Check BMP in the morning, monitor urine output, daily weights - ECHO and CXR pending this AM - Weight trend: Filed Weights   11/30/16 0400 12/01/16 0538 12/02/16 0419  Weight: (!) 142.7 kg (314 lb 8 oz) (!) 142.7 kg (314 lb 11.2 oz) (!) 145 kg (319 lb 11.2 oz)   Diabetes with complications of nephropathy, PVD, right BKA - Has been on Humalog at home, here we have substituted with NovoLog - CBGs have been reasonably stable so far - keep on Neurontin but dose is lower than home (at home pt is on 800 mg TID), due to worsening renal function   HTN, essential - Reasonably stable - Currently on Lasix 40 mg IV twice a day and metoprolol 50 mg by mouth twice a day  Obstructive sleep apnea - With known history of noncompliance with CPAP - Compliance discussed and patient verbalizes understanding  Candidiasis, breast - Overall improving, today is day #5/7 of Diflucan  Hypomagnesemia - Has been supplemented, within normal limits  Anemia of chronic  disease - diabetes and OSA - no evidence of active bleeding - Hg overall stable   Hyperkalemia - stop supplementation and repeat BMP in the morning  HLD - Keep on statin  Fall - Plan to discharge to skilled nursing facility once medically cleared  CKD stage III - Creatinine has been steadily increasing while patient on Lasix - We'll follow-up on cardiology team recommendations regarding Lasix dosing - Very low threshold for consultation with nephrologist if creatinine does not improve - We'll repeat BMP in the morning  Morbid obesity - Body mass index is 45.87 kg/m.  DVT prophylaxis: Patient has been on Lovenox SQ however, given worsening creatinine, will change to heparin Code Status: Full code Family Communication: Patient and family at bedside Disposition Plan: Plan to discharge to skilled nursing facility once cardiology team clears  Consultants:   Cardiology  Procedures:   None  Antimicrobials:    Diflucan 11/28/2016 -->  Subjective: Patient reports feeling better overall, less dyspnea but still significant dyspnea on exertion.  Objective: Vitals:   12/01/16 0538 12/01/16 1300 12/02/16 0419 12/02/16 0903  BP: (!) 117/55 (!) 137/57 115/83   Pulse: (!) 58 60 (!) 57   Resp: 16 18 18    Temp:  97.8 F (36.6 C) 98.3 F (36.8 C)   TempSrc:  Oral Oral   SpO2: 100% 100% 100% (!) 88%  Weight: (!) 142.7 kg (314 lb 11.2 oz)  (!) 145 kg (319  lb 11.2 oz)   Height:        Intake/Output Summary (Last 24 hours) at 12/02/16 0932 Last data filed at 12/01/16 1300  Gross per 24 hour  Intake              240 ml  Output              200 ml  Net               40 ml   Filed Weights   11/30/16 0400 12/01/16 0538 12/02/16 0419  Weight: (!) 142.7 kg (314 lb 8 oz) (!) 142.7 kg (314 lb 11.2 oz) (!) 145 kg (319 lb 11.2 oz)    Physical Exam  Constitutional: Appears well-developed and well-nourished.  CVS: RRR, S1/S2 +, no murmurs, no gallops, no carotid bruit. Distant  heart sounds Pulmonary: Effort and breath sounds normal, diminished breath sounds at bases with mild expiratory wheezing Abdominal: Soft. BS +,  no distension, tenderness, rebound or guarding.  Musculoskeletal: Right BKA , +2 left lower extremity edema  Lymphadenopathy: No lymphadenopathy noted, cervical, inguinal. Neuro: Alert. Normal reflexes, muscle tone coordination. No cranial nerve deficit. Psychiatric: Normal mood and affect. Behavior, judgment, thought content normal.   Data Reviewed: I have personally reviewed following labs and imaging studies  CBC:  Recent Labs Lab 11/28/16 1225 12/02/16 0607  WBC 6.7 6.5  NEUTROABS 4.6  --   HGB 9.7* 10.1*  HCT 30.9* 33.2*  MCV 87.3 89.7  PLT 266 081   Basic Metabolic Panel:  Recent Labs Lab 11/28/16 1225 11/29/16 0559 11/30/16 0537 12/01/16 0626 12/02/16 0607  NA 145 141 139 138 138  K 3.9 4.3 4.5 4.7 5.3*  CL 111 109 106 104 104  CO2 27 26 26 26 28   GLUCOSE 114* 92 75 115* 90  BUN 29* 29* 33* 37* 41*  CREATININE 1.47* 1.52* 1.74* 2.16* 2.20*  CALCIUM 7.7* 7.5* 7.6* 7.6* 7.6*  MG 1.5*  --   --  1.9 1.9   Liver Function Tests:  Recent Labs Lab 11/28/16 1225  AST 20  ALT 16  ALKPHOS 85  BILITOT 0.6  PROT 5.8*  ALBUMIN 2.2*   Cardiac Enzymes:  Recent Labs Lab 11/28/16 1225  TROPONINI 0.03*   CBG:  Recent Labs Lab 12/01/16 0733 12/01/16 1137 12/01/16 1632 12/01/16 2128 12/02/16 0721  GLUCAP 98 134* 192* 122* 107*    Radiology Studies: Dg Chest 1 View Result Date: 11/28/2016 Cardiomegaly with vascular congestion and worsening interstitial prominence, likely interstitial edema.   Dg Shoulder Left Result Date: 11/28/2016 No acute bony abnormality.  Mild degenerative changes.   Scheduled Meds: . cilostazol  100 mg Oral BID  . DULoxetine  60 mg Oral q morning - 10a  . enoxaparin (LOVENOX) injection  70 mg Subcutaneous Q24H  . ferrous sulfate  325 mg Oral Q breakfast  . fluconazole  100 mg Oral  Daily  . furosemide  40 mg Intravenous Q12H  . gabapentin  400 mg Oral TID  . insulin aspart  0-5 Units Subcutaneous QHS  . insulin aspart  0-9 Units Subcutaneous TID WC  . insulin aspart protamine- aspart  10 Units Subcutaneous BID WC  . metoprolol tartrate  50 mg Oral BID  . nystatin   Topical TID  . pravastatin  40 mg Oral Daily  . rOPINIRole  2 mg Oral QPM  . sodium chloride flush  3 mL Intravenous Q12H   Continuous Infusions: . sodium chloride  LOS: 4 days   Time spent: 35 minutes  Faye Ramsay, MD  [facility] Pager [phone]  If 7PM-7AM, please contact night-coverage [email or web address] Password [email or web address] 12/02/2016, 9:32 AM

## 2016-12-02 NOTE — Progress Notes (Signed)
LCSW following for disposition: New SNF  Agreeable to SNF: [facility].  Bed has been offered and selected. Dicussed case with RN, patient still medically acute and not ready.  Patient will require insurance auth and additional PT note for insurance ([employer])  Will complete when appropriate.    Lane Hacker, MSW Clinical Social Work: [employer] Coverage for :  [phone]

## 2016-12-03 DIAGNOSIS — I429 Cardiomyopathy, unspecified: Secondary | ICD-10-CM

## 2016-12-03 DIAGNOSIS — I5043 Acute on chronic combined systolic (congestive) and diastolic (congestive) heart failure: Secondary | ICD-10-CM

## 2016-12-03 DIAGNOSIS — B3789 Other sites of candidiasis: Secondary | ICD-10-CM

## 2016-12-03 DIAGNOSIS — I5033 Acute on chronic diastolic (congestive) heart failure: Secondary | ICD-10-CM

## 2016-12-03 DIAGNOSIS — I519 Heart disease, unspecified: Secondary | ICD-10-CM

## 2016-12-03 LAB — GLUCOSE, CAPILLARY
Glucose-Capillary: 57 mg/dL — ABNORMAL LOW (ref 65–99)
Glucose-Capillary: 84 mg/dL (ref 65–99)
Glucose-Capillary: 109 mg/dL — ABNORMAL HIGH (ref 65–99)
Glucose-Capillary: 133 mg/dL — ABNORMAL HIGH (ref 65–99)
Glucose-Capillary: 158 mg/dL — ABNORMAL HIGH (ref 65–99)

## 2016-12-03 LAB — BASIC METABOLIC PANEL
Anion gap: 6 (ref 5–15)
BUN: 45 mg/dL — AB (ref 6–20)
CHLORIDE: 104 mmol/L (ref 101–111)
CO2: 28 mmol/L (ref 22–32)
Calcium: 7.5 mg/dL — ABNORMAL LOW (ref 8.9–10.3)
Creatinine, Ser: 2.1 mg/dL — ABNORMAL HIGH (ref 0.44–1.00)
GFR calc Af Amer: 26 mL/min — ABNORMAL LOW (ref >60)
GFR, EST NON AFRICAN AMERICAN: 23 mL/min — AB (ref >60)
GLUCOSE: 66 mg/dL (ref 65–99)
POTASSIUM: 4.7 mmol/L (ref 3.5–5.1)
Sodium: 138 mmol/L (ref 135–145)

## 2016-12-03 LAB — CBC
HEMATOCRIT: 31.4 % — AB (ref 36.0–46.0)
Hemoglobin: 9.9 g/dL — ABNORMAL LOW (ref 12.0–15.0)
MCH: 27.7 pg (ref 26.0–34.0)
MCHC: 31.5 g/dL (ref 30.0–36.0)
MCV: 88 fL (ref 78.0–100.0)
Platelets: 242 10*3/uL (ref 150–400)
RBC: 3.57 MIL/uL — ABNORMAL LOW (ref 3.87–5.11)
RDW: 16.2 % — AB (ref 11.5–15.5)
WBC: 10.3 10*3/uL (ref 4.0–10.5)

## 2016-12-03 MED ORDER — METOPROLOL TARTRATE 50 MG PO TABS: 75.0000 mg | ORAL_TABLET | Freq: Two times a day (BID) | ORAL | Status: DC

## 2016-12-03 MED ORDER — INSULIN ASPART PROT & ASPART (70-30 MIX) 100 UNIT/ML ~~LOC~~ SUSP
8.0000 [IU] | Freq: Two times a day (BID) | SUBCUTANEOUS | Status: DC
  Administered 2016-12-04 – 2016-12-06 (×3): 8 [IU] via SUBCUTANEOUS

## 2016-12-03 MED ORDER — HYDRALAZINE HCL 10 MG PO TABS
10.0000 mg | ORAL_TABLET | Freq: Three times a day (TID) | ORAL | Status: DC
  Administered 2016-12-03 – 2016-12-06 (×9): 10 mg via ORAL
  Filled 2016-12-03 (×9): qty 1

## 2016-12-03 MED ORDER — METOPROLOL SUCCINATE ER 50 MG PO TB24
50.0000 mg | ORAL_TABLET | Freq: Two times a day (BID) | ORAL | Status: DC
  Administered 2016-12-03 – 2016-12-06 (×6): 50 mg via ORAL
  Filled 2016-12-03 (×7): qty 1

## 2016-12-03 NOTE — Care Management Important Message (Signed)
Important Message  Patient Details  Name: Mary [name] MRN: [id number] Date of Birth: Oct 18, 1945   Medicare Important Message Given:  Yes    Mikhail Hallenbeck, Chauncey Reading, RN 12/03/2016, 8:49 AM

## 2016-12-03 NOTE — Progress Notes (Signed)
Inpatient Diabetes Program Recommendations  AACE/ADA: New Consensus Statement on Inpatient Glycemic Control (2015)  Target Ranges:  Prepandial:   less than 140 mg/dL      Peak postprandial:   less than 180 mg/dL (1-2 hours)      Critically ill patients:  140 - 180 mg/dL   Lab Results  Component Value Date   GLUCAP 109 (H) 12/03/2016   HGBA1C 10.9 (H) 01/13/2014    Results for YAZAIRA, SPEAS (MRN [id number]) as of 12/03/2016 11:47  Ref. Range 12/02/2016 21:31 12/03/2016 07:21 12/03/2016 07:46 12/03/2016 11:12  Glucose-Capillary Latest Ref Range: 65 - 99 mg/dL 174 (H) 57 (L) 84 109 (H)   Please consider decreasing pm dose of 70/30 to 8 units. Also check HgbA1c if there is not a current one.  [place], CDE. M.Ed. Pager [phone] Inpatient Diabetes Coordinator

## 2016-12-03 NOTE — Progress Notes (Signed)
Hypoglycemic Event  CBG: 57  Treatment: 15 GM carbohydrate snack  Symptoms: None  Follow-up CBG: Time:0747 CBG Result:84  Possible Reasons for Event: Unknown  Comments/MD notified: Dr Marin Comment text page via [facility], will continue to monitor.     Lynnda Shields

## 2016-12-03 NOTE — Progress Notes (Addendum)
Pt complains of leg discomfort and restlessness.  Tramadol PRN given at 1516 and scheduled Requip given.  Pt PTA meds have been adjusted since admission, text paged Dr Marin Comment via [facility] to see if any other medicine available.  No new orders at this time.  Will continue to monitor pt.

## 2016-12-03 NOTE — Consult Note (Signed)
Consult requested by: [facility] Consult requested for: Sleep apnea  HPI: This is a 71 year [name] who came to the hospital with a fall at home. She came to the emergency room for evaluation was found to have severe shortness of breath. She says that her shortness of breath has gotten worse in the last 3-4 weeks prior to hospitalization. She has also had swelling of her lower extremity and her abdomen and she's gained about 40 pounds. She has had several falls apparently because she says that her left leg has become weak. Although in the medical record it says that she did not have CPAP ordered she tells me today that she had it but she didn't tolerate it. However she falls asleep at least 4 or 5 times during our conversation. She has multiple other medical problems including arthritis in her knees congestive heart failure obesity diabetes with neuropathy history of osteomyelitis of her right foot. She has had an amputation of her right leg below the knee. She denies chest pain nausea vomiting diarrhea does complain of a rash underneath her breasts. Past Medical History:  Diagnosis Date  . Anemia   . Arthritis    knees  . Cellulitis and abscess of foot 02/12/2012  . CHF (congestive heart failure) ([place])   . Complication of anesthesia    pt states after her hysterectomy the doctor said she was "wild" when she woke up   . Diabetes mellitus   . Diabetes mellitus with neuropathy 07/25/2011  . Dysrhythmia    tachy- takes Metoprolol  . OSA (obstructive sleep apnea) 07/25/2011   not using CPAP  . Osteomyelitis of right foot ([place]) 02/14/2012  . Partial Achilles tendon tear 02/14/2012  . Restless leg syndrome 07/25/2011  . Wound, open    right foot     Family History  Problem Relation Age of Onset  . Diabetes Sister    No known family history of COPD or sleep apnea  Social History   Social History  . Marital status: Married    Spouse name: N/A  . Number of children: N/A  . Years of  education: N/A   Social History Main Topics  . Smoking status: Never Smoker  . Smokeless tobacco: Never Used  . Alcohol use No  . Drug use: No  . Sexual activity: No   Other Topics Concern  . None   Social History Narrative  . None     ROS: Except as mentioned 10 point review systems is negative    Objective: Vital signs in last 24 hours: Temp:  [97.4 F (36.3 C)-98 F (36.7 C)] 97.4 F (36.3 C) (07/06 0615) Pulse Rate:  [65-67] 65 (07/06 0615) Resp:  [18-20] 20 (07/06 0615) BP: (131-149)/(53-73) 149/69 (07/06 0615) SpO2:  [88 %-100 %] 95 % (07/06 0615) Weight:  [143.4 kg (316 lb 3.2 oz)] 143.4 kg (316 lb 3.2 oz) (07/06 0615) Weight change: -1.588 kg (-3 lb 8 oz) Last BM Date: 12/01/16  Intake/Output from previous day: 07/05 0701 - 07/06 0700 In: 770 [P.O.:720; IV Piggyback:50] Out: 2800 [Urine:2800]  PHYSICAL EXAM Constitutional: She is awake and alert and falls asleep during the examination on multiple occasions. She is obese. Eyes: Pupils react. EOMI. Ears nose mouth and throat: Mucous membranes are moist hearing is grossly normal. Cardiovascular: Her heart is regular with normal heart sounds. Respiratory: Her respiratory effort is normal. Her lungs are clear. Gastrointestinal: Her abdomen is soft obese with no masses musculoskeletal: Strength is normal. She has had amputation  of her right leg. Skin: Warm and dry. Neurological: She is oriented but falls asleep during the examination. Psychiatric: Normal mood and affect  Lab Results: Basic Metabolic Panel:  Recent Labs  12/01/16 0626 12/02/16 0607 12/02/16 1500 12/03/16 0712  NA 138 138 135 138  K 4.7 5.3* 5.0 4.7  CL 104 104 102 104  CO2 26 28 25 28   GLUCOSE 115* 90 146* 66  BUN 37* 41* 43* 45*  CREATININE 2.16* 2.20* 2.18* 2.10*  CALCIUM 7.6* 7.6* 7.5* 7.5*  MG 1.9 1.9  --   --    Liver Function Tests: No results for input(s): AST, ALT, ALKPHOS, BILITOT, PROT, ALBUMIN in the last 72 hours. No results  for input(s): LIPASE, AMYLASE in the last 72 hours. No results for input(s): AMMONIA in the last 72 hours. CBC:  Recent Labs  12/02/16 0607 12/03/16 0712  WBC 6.5 10.3  HGB 10.1* 9.9*  HCT 33.2* 31.4*  MCV 89.7 88.0  PLT 276 242   Cardiac Enzymes: No results for input(s): CKTOTAL, CKMB, CKMBINDEX, TROPONINI in the last 72 hours. BNP: No results for input(s): PROBNP in the last 72 hours. D-Dimer: No results for input(s): DDIMER in the last 72 hours. CBG:  Recent Labs  12/02/16 0721 12/02/16 1138 12/02/16 1653 12/02/16 2131 12/03/16 0721 12/03/16 0746  GLUCAP 107* 108* 135* 174* 57* 84   Hemoglobin A1C: No results for input(s): HGBA1C in the last 72 hours. Fasting Lipid Panel: No results for input(s): CHOL, HDL, LDLCALC, TRIG, CHOLHDL, LDLDIRECT in the last 72 hours. Thyroid Function Tests: No results for input(s): TSH, T4TOTAL, FREET4, T3FREE, THYROIDAB in the last 72 hours. Anemia Panel: No results for input(s): VITAMINB12, FOLATE, FERRITIN, TIBC, IRON, RETICCTPCT in the last 72 hours. Coagulation: No results for input(s): LABPROT, INR in the last 72 hours. Urine Drug Screen: Drugs of Abuse     Component Value Date/Time   LABOPIA NONE DETECTED 01/13/2014 0232   COCAINSCRNUR NONE DETECTED 01/13/2014 0232   LABBENZ NONE DETECTED 01/13/2014 0232   AMPHETMU NONE DETECTED 01/13/2014 0232   THCU NONE DETECTED 01/13/2014 0232   LABBARB NONE DETECTED 01/13/2014 0232    Alcohol Level: No results for input(s): ETH in the last 72 hours. Urinalysis:  Recent Labs  12/02/16 0918  COLORURINE YELLOW  LABSPEC 1.008  PHURINE 5.0  GLUCOSEU NEGATIVE  HGBUR MODERATE*  BILIRUBINUR NEGATIVE  KETONESUR NEGATIVE  PROTEINUR 100*  NITRITE NEGATIVE  LEUKOCYTESUR MODERATE*   Misc. Labs:   ABGS: No results for input(s): PHART, PO2ART, TCO2, HCO3 in the last 72 hours.  Invalid input(s): PCO2   MICROBIOLOGY: No results found for this or any previous visit (from the  past 240 hour(s)).  Studies/Results: Dg Chest 1 View  Result Date: 12/02/2016 CLINICAL DATA:  Wheezing EXAM: CHEST 1 VIEW COMPARISON:  11/28/2016 FINDINGS: Lungs are clear.  No pleural effusion or pneumothorax. Cardiomegaly. IMPRESSION: No evidence of acute cardiopulmonary disease. Electronically Signed   By: Julian Hy M.D.   On: 12/02/2016 10:52    Medications:  Prior to Admission:  Prescriptions Prior to Admission  Medication Sig Dispense Refill Last Dose  . cholecalciferol (VITAMIN D) 1000 UNITS tablet Take 1,000 Units by mouth daily.    Past Month at Unknown time  . cilostazol (PLETAL) 100 MG tablet Take 100 mg by mouth 2 (two) times daily.    Past Week at Unknown time  . compounded topicals builder Apply 1 application topically. KET/DIC/BAC/LID/M/GUA   Past Week at Unknown time  . DULoxetine (  CYMBALTA) 60 MG capsule Take 60 mg by mouth every morning.    Past Week at Unknown time  . ferrous sulfate 325 (65 FE) MG tablet Take 650 mg by mouth 2 (two) times daily with a meal.    Past Week at Unknown time  . furosemide (LASIX) 20 MG tablet Take 1 tablet (20 mg total) by mouth daily. (Patient taking differently: Take 20 mg by mouth 2 (two) times daily. ) 90 tablet 3 11/28/2016 at Unknown time  . gabapentin (NEURONTIN) 800 MG tablet Take 800 mg by mouth 3 (three) times daily.    Past Week at Unknown time  . insulin lispro protamine-lispro (HUMALOG 50/50 MIX) (50-50) 100 UNIT/ML SUSP injection Inject 0.2 mLs (20 Units total) into the skin 3 (three) times daily. (Patient taking differently: Inject 20-25 Units into the skin 3 (three) times daily. Patient self adjusts amount based on sugar levels) 10 mL 11 Past Week at Unknown time  . lidocaine-prilocaine (EMLA) cream Apply 1 application topically as needed.    Past Week at Unknown time  . lisinopril-hydrochlorothiazide (PRINZIDE,ZESTORETIC) 20-25 MG tablet Take 1 tablet by mouth daily. 30 tablet 11 Past Week at Unknown time  . metFORMIN  (GLUCOPHAGE) 1000 MG tablet Take 1,000 mg by mouth 2 (two) times daily with a meal.    Past Week at Unknown time  . metoprolol (LOPRESSOR) 50 MG tablet TAKE (1) TABLET BY MOUTH TWICE DAILY. 60 tablet 0 Past Week at 1800  . Multiple Vitamin (MULTIVITAMIN WITH MINERALS) TABS Take 1 tablet by mouth once a week.    Past Week at Unknown time  . pravastatin (PRAVACHOL) 40 MG tablet Take 40 mg by mouth daily.   Past Week at Unknown time  . rOPINIRole (REQUIP) 2 MG tablet Take 2 mg by mouth every evening.    Past Week at Unknown time  . Tolnaftate (ABSORBINE JR EX) Apply 1 application topically daily as needed (for pain).   Past Week at Unknown time  . traMADol (ULTRAM) 50 MG tablet Take 1 tablet (50 mg total) by mouth every 6 (six) hours as needed. 15 tablet 0 Past Week at Unknown time  . potassium chloride (K-DUR) 10 MEQ tablet Take 1 tablet (10 mEq total) by mouth daily. 90 tablet 3 Taking   Scheduled: . cilostazol  100 mg Oral BID  . DULoxetine  60 mg Oral q morning - 10a  . ferrous sulfate  325 mg Oral Q breakfast  . fluconazole  100 mg Oral Daily  . furosemide  40 mg Intravenous Q12H  . heparin subcutaneous  5,000 Units Subcutaneous Q8H  . insulin aspart  0-5 Units Subcutaneous QHS  . insulin aspart  0-9 Units Subcutaneous TID WC  . insulin aspart protamine- aspart  10 Units Subcutaneous BID WC  . metoprolol tartrate  50 mg Oral BID  . nystatin   Topical TID  . pravastatin  40 mg Oral Daily  . rOPINIRole  2 mg Oral QPM  . sodium chloride flush  3 mL Intravenous Q12H   Continuous: . sodium chloride    . cefTRIAXone (ROCEPHIN)  IV Stopped (12/02/16 1555)   WUJ:WJXBJY chloride, acetaminophen, ipratropium-albuterol, ondansetron (ZOFRAN) IV, sodium chloride flush, traMADol  Assesment: She was admitted with acute on chronic diastolic heart failure. She has obstructive sleep apnea and had trouble tolerating CPAP. She had her sleep study done in [place] and I will send for the records.  I'm going to have her try CPAP here in the hospital and  see if it helps and if we can find a mask that she can tolerate. That should help significantly with her multiple falls because I think she may simply fall asleep, and with her heart failure. Active Problems:   Acute on chronic diastolic heart failure (H[facility])   Diabetes mellitus with neuropathy   OSA (obstructive sleep apnea)   Hyperlipidemia   Hypertension   Morbid obesity ([place])   Candidiasis of breast   Hx of right BKA (H[facility])   CHF exacerbation ([place])   Fall   Acute on chronic diastolic CHF (congestive heart failure) ([facility])   CKD (chronic kidney disease), stage III    Plan: Try CPAP here    LOS: 5 days   Laryn Venning L 12/03/2016, 8:00 AM

## 2016-12-03 NOTE — Progress Notes (Signed)
Progress Note  Patient Name: Mary [name] Date of Encounter: 12/03/2016  Primary Cardiologist: Harl Bowie  Subjective   Says breathing has improved. Still has left leg swelling.  Inpatient Medications    Scheduled Meds: . cilostazol  100 mg Oral BID  . DULoxetine  60 mg Oral q morning - 10a  . ferrous sulfate  325 mg Oral Q breakfast  . fluconazole  100 mg Oral Daily  . furosemide  40 mg Intravenous Q12H  . heparin subcutaneous  5,000 Units Subcutaneous Q8H  . insulin aspart  0-5 Units Subcutaneous QHS  . insulin aspart  0-9 Units Subcutaneous TID WC  . insulin aspart protamine- aspart  10 Units Subcutaneous BID WC  . metoprolol tartrate  50 mg Oral BID  . nystatin   Topical TID  . pravastatin  40 mg Oral Daily  . rOPINIRole  2 mg Oral QPM  . sodium chloride flush  3 mL Intravenous Q12H   Continuous Infusions: . sodium chloride    . cefTRIAXone (ROCEPHIN)  IV Stopped (12/02/16 1555)   PRN Meds: sodium chloride, acetaminophen, ipratropium-albuterol, ondansetron (ZOFRAN) IV, sodium chloride flush, traMADol   Vital Signs    Vitals:   12/02/16 1300 12/02/16 1812 12/02/16 2134 12/03/16 0615  BP: (!) 131/53  (!) 149/73 (!) 149/69  Pulse: 65  67 65  Resp: 18  18 20   Temp: 97.7 F (36.5 C)  98 F (36.7 C) (!) 97.4 F (36.3 C)  TempSrc: Axillary  Oral Oral  SpO2: 100% 97% 96% 95%  Weight:    (!) 316 lb 3.2 oz (143.4 kg)  Height:        Intake/Output Summary (Last 24 hours) at 12/03/16 1038 Last data filed at 12/03/16 0900  Gross per 24 hour  Intake              770 ml  Output             2800 ml  Net            -2030 ml   Filed Weights   12/01/16 0538 12/02/16 0419 12/03/16 0615  Weight: (!) 314 lb 11.2 oz (142.7 kg) (!) 319 lb 11.2 oz (145 kg) (!) 316 lb 3.2 oz (143.4 kg)    Telemetry    NSR rates in the 60's and 70's. Motion artifact.   Physical Exam   GEN: No acute distress.   Neck: No JVD Cardiac: RRR, 1/6 systolic murmur,  GI: Soft, nontender,  non-distended Obse MS: No edema; No deformity.1+ pitting edema on the left,AKA on the right.  Neuro:  Nonfocal  Psych: Normal affect   Labs    Chemistry Recent Labs Lab 11/28/16 1225  12/02/16 0607 12/02/16 1500 12/03/16 0712  NA 145  < > 138 135 138  K 3.9  < > 5.3* 5.0 4.7  CL 111  < > 104 102 104  CO2 27  < > 28 25 28   GLUCOSE 114*  < > 90 146* 66  BUN 29*  < > 41* 43* 45*  CREATININE 1.47*  < > 2.20* 2.18* 2.10*  CALCIUM 7.7*  < > 7.6* 7.5* 7.5*  PROT 5.8*  --   --   --   --   ALBUMIN 2.2*  --   --   --   --   AST 20  --   --   --   --   ALT 16  --   --   --   --  ALKPHOS 85  --   --   --   --   BILITOT 0.6  --   --   --   --   GFRNONAA 35*  < > 21* 22* 23*  GFRAA 41*  < > 25* 25* 26*  ANIONGAP 7  < > 6 8 6   < > = values in this interval not displayed.   Hematology Recent Labs Lab 11/28/16 1225 12/02/16 0607 12/03/16 0712  WBC 6.7 6.5 10.3  RBC 3.54* 3.70* 3.57*  HGB 9.7* 10.1* 9.9*  HCT 30.9* 33.2* 31.4*  MCV 87.3 89.7 88.0  MCH 27.4 27.3 27.7  MCHC 31.4 30.4 31.5  RDW 16.1* 16.2* 16.2*  PLT 266 276 242    Cardiac Enzymes Recent Labs Lab 11/28/16 1225  TROPONINI 0.03*   No results for input(s): TROPIPOC in the last 168 hours.   BNP Recent Labs Lab 11/28/16 1225  BNP 1,485.0*     DDimer No results for input(s): DDIMER in the last 168 hours.   Radiology    Dg Chest 1 View  Result Date: 12/02/2016 CLINICAL DATA:  Wheezing EXAM: CHEST 1 VIEW COMPARISON:  11/28/2016 FINDINGS: Lungs are clear.  No pleural effusion or pneumothorax. Cardiomegaly. IMPRESSION: No evidence of acute cardiopulmonary disease. Electronically Signed   By: Julian Hy M.D.   On: 12/02/2016 10:52    Cardiac Studies  Echocardiogram 12/02/2016  Left ventricle: The cavity size was normal. There was moderate   concentric hypertrophy. Systolic function was moderately to   severely reduced. The estimated ejection fraction was in the   range of 30% to 35%. Diffuse  hypokinesis. Features are consistent   with a pseudonormal left ventricular filling pattern, with   concomitant abnormal relaxation and increased filling pressure   (grade 2 diastolic dysfunction). Doppler parameters are   consistent with high ventricular filling pressure. - Regional wall motion abnormality: Moderate hypokinesis of the mid   anterior, mid anteroseptal, mid anterolateral, and apical lateral   myocardium; mild hypokinesis of the mid inferoseptal myocardium. - Mitral valve: Mildly thickened leaflets . There was mild   regurgitation. - Left atrium: The atrium was mildly dilated. - Right ventricle: Systolic function was reduced. - Tricuspid valve: Mildly thickened leaflets. There was moderate   regurgitation. - Pulmonary arteries: PA peak pressure: 47 mm Hg (S). - Inferior vena cava: The vessel was dilated. The respirophasic   diameter changes were blunted (< 50%), consistent with elevated   central venous pressure. - Pericardium, extracardiac: Moderate size pericardial effusion,   larger more posteriorly. Features were not consistent with   tamponade physiology.  Patient Profile     71 y.o. female with a hx of chronic diastolic CHF, hypertension, hyperlipidemia,PSVT,Morbid obesity is acute respiratory failure, frequent falls,  OSA, but does not use CPAP(states it was never ordered for her), and chronic dyspnea who is being seen today for the evaluation of diastolic CHF   Assessment & Plan    1. Acute combined Systolic and Diastolic CHF : Remains on IV Lasix 40 mg twice a day with 2.9 L output, since admission. Remains mildly edematous in the lower extremities, but may also be related to lymphedema and obesity. Breathing status has improved. Lungs do have some mild crackles in the bases.  With newly documented reduced LV systolic function down to 35% compared to prior echocardiogram revealing normal LV function.  Medication management will need to be adjusted. Renal status  has worsened since admission which may be related to diuresis versus  cardiorenal syndrome but improved from yesterday.  Weight has decreased 3 pounds since admission, but not down to dry weight which is closer to 300 pounds. This may have to be reset. May need to consider IV Lasix drip versus IV bolus with reduced EF and decreasing urinary output since admission. Would not use metolazone in the case of worsening renal function.   Heart rate is controlled currently at rest, concerns that it will go up as she begins to ambulate with walker. She is currently on metoprolol 50 mg twice a day, may need to consider increasing to 75 mg daily for heart rate control and to keep blood pressure lower with reduced EF.  We'll discuss with Dr. Bronson Ing follow-up invasive ischemic testing with catheterization, cannot do that at this time due to renal function. She is not currently on ARB or ACE. May benefit from [employer] once renal function improves and stabilizes.  For now, further diuresis, BP control and heart rate control will be priority.Will institute fluid restriction. She has a lot of water and drinks around her. Will need to be stricter on this.   2.Anemia: We started on iron replacement therapy yesterday.  3. Diabetes: Followed by primary care team.  4. Hypertension: Not optimal or control in the setting of significantly reduced LV systolic function. Would not be a candidate for ACE inhibitor or ARB with renal function. We'll begin hydralazine 10 mg every 8 hours, and can titrate up to keep blood pressure better controlled. The patient has a history of frequent falls and has been very sedentary. Want to avoid orthostatic hypotension with lower blood pressure when she is not used to this.  5. Hx of OSA: Not on CPAP currently.   6. PAD: Status post right AKA, with brace on the left.  Signed, Phill Myron. [place], ANP, AACC   12/03/2016, 10:38 AM    The patient was seen and examined, and I agree  with the history, physical exam, assessment and plan as documented above, with modifications as noted below.  She is diuresing well with over 2 L output in last 24 hrs on IV Lasix 40 mg bid, which I would continue for now with continued monitoring of renal function. GFR is stable.  CVP elevated by echocardiogram indicating additional diuresis needed.  Echocardiogram personally reviewed which shows new moderately reduced LV systolic function, LVEF 70-17%, with diffuse hypokinesis but additional regional wall motion abnormalities suggestive of ischemic heart disease. Grade 2 diastolic dysfunction also present. RV function is also reduced and likely due to morbid obesity and untreated OSA.  Pericardial effusion present but no evidence of tamponade.  Appreciate pulmonary assistance with CPAP.  Chest xray 7/5 showed resolution of pulmonary edema. UA confirmed proteinuria. However, due to renal dysfunction, cannot add ACEI/ARB at this time.  Given cardiomyopathy, will switch metoprolol tartrate to succinate as this has proven benefit. Agree with addition of hydralazine for BP reduction.  Will need outpatient ischemic evaluation (Lexiscan Myoview) to assess for ischemic etiology of reduced LV systolic function/cardiomyopathy. Already on statin therapy. Will hold off on addition of ASA for now (can be initiated in outpatient setting with Hgb monitoring, currently 9.9).  Kate Sable, MD, [name]  12/03/2016 11:33 AM

## 2016-12-03 NOTE — Progress Notes (Signed)
PROGRESS NOTE    Mary [name]  WNI:[id number] DOB: [birth year] DOA: 11/28/2016 PCP: Celene Squibb, MD    Brief Narrative: Patient was admitted for SOB, felt to be due to multiple factors including morbid obesity, volume overloaded, non compliance with CPAP for OSA.  She also has CKD III, with worsening Cr, HTN, HLD, s/p right BKA.  She is awaiting SNF at this time.    Assessment & Plan:   Active Problems:   Acute on chronic diastolic heart failure (HCC)   Diabetes mellitus with neuropathy   OSA (obstructive sleep apnea)   Hyperlipidemia   Hypertension   Morbid obesity ([place])   Candidiasis of breast   Hx of right BKA (HCC)   CHF exacerbation ([place])   Fall   Acute on chronic diastolic CHF (congestive heart failure) (HCC)   CKD (chronic kidney disease), stage III  Acute on chronic diastolic congestive heart failure:  Will continue with diuresis per cardiology recommendation.     Diabetes with complications of nephropathy, PVD, right BKA - Has been on Humalog at home, here we have substituted with NovoLog - CBGs have been reasonably stable so far - keep on Neurontin but dose is lower than home (at home pt is on 800 mg TID), due to worsening renal function   HTN, essential - Reasonably stable - Currently on Lasix 40 mg IV twice a day and metoprolol 50 mg by mouth twice a day  Obstructive sleep apnea - With known history of noncompliance with CPAP - Compliance discussed and patient verbalizes understanding. - Appreciate Dr Miguel Rota consultation.   Candidiasis, breast - Overall improving, today is day #5/7 of Diflucan  Hypomagnesemia - Has been supplemented, within normal limits  Anemia of chronic disease - diabetes and OSA - no evidence of active bleeding - Hg overall stable   Hyperkalemia:  likeky from decreased GFR.  Supplement d/c.  Repeated OK.   HLD - Keep on statin  Fall - Plan to discharge to skilled nursing facility once medically  cleared  CKD stage III - Creatinine has been steadily increasing while patient on Lasix - We'll follow-up on cardiology team recommendations regarding Lasix dosing - Very low threshold for consultation with nephrologist if creatinine does not improve - Cr elevated but stable.   Morbid obesity - Body mass index is 45.87 kg/m.  DVT prophylaxis: Patient has been on Lovenox SQ however, given worsening creatinine, will change to heparin Code Status: Full code Family Communication: Patient and family at bedside Disposition Plan: Plan to discharge to skilled nursing facility once cardiology team clears  Consultants:   Cardiology  Procedures:   None  Antimicrobials:    Diflucan 11/28/2016 -->  Subjective: Patient reports feeling better overall, less dyspnea but still significant dyspnea on exertion.   Antimicrobials: Anti-infectives    Start     Dose/Rate Route Frequency Ordered Stop   12/02/16 1400  cefTRIAXone (ROCEPHIN) 1 g in dextrose 5 % 50 mL IVPB     1 g 100 mL/hr over 30 Minutes Intravenous Every 24 hours 12/02/16 1346     11/28/16 1900  fluconazole (DIFLUCAN) tablet 100 mg     100 mg Oral Daily 11/28/16 1812         Subjective:  SOB has improved.  Feel sleepy.   Objective: Vitals:   12/02/16 1300 12/02/16 1812 12/02/16 2134 12/03/16 0615  BP: (!) 131/53  (!) 149/73 (!) 149/69  Pulse: 65  67 65  Resp: 18  18 20   Temp:  97.7 F (36.5 C)  98 F (36.7 C) (!) 97.4 F (36.3 C)  TempSrc: Axillary  Oral Oral  SpO2: 100% 97% 96% 95%  Weight:    (!) 143.4 kg (316 lb 3.2 oz)  Height:        Intake/Output Summary (Last 24 hours) at 12/03/16 0956 Last data filed at 12/03/16 0240  Gross per 24 hour  Intake              530 ml  Output             2800 ml  Net            -2270 ml   Filed Weights   12/01/16 0538 12/02/16 0419 12/03/16 0615  Weight: (!) 142.7 kg (314 lb 11.2 oz) (!) 145 kg (319 lb 11.2 oz) (!) 143.4 kg (316 lb 3.2 oz)     Examination:  General exam: Appears calm and comfortable  Respiratory system: Clear to auscultation. Respiratory effort normal. Cardiovascular system: S1 & S2 heard, RRR. No JVD, murmurs, rubs, gallops or clicks. No pedal edema. Gastrointestinal system: Abdomen is nondistended, soft and nontender. No organomegaly or masses felt. Normal bowel sounds heard. Central nervous system: Alert and oriented. No focal neurological deficits. Extremities: Symmetric 5 x 5 power. Skin: No rashes, lesions or ulcers Psychiatry: Judgement and insight appear normal. Mood & affect appropriate.   Data Reviewed: I have personally reviewed following labs and imaging studies  CBC:  Recent Labs Lab 11/28/16 1225 12/02/16 0607 12/03/16 0712  WBC 6.7 6.5 10.3  NEUTROABS 4.6  --   --   HGB 9.7* 10.1* 9.9*  HCT 30.9* 33.2* 31.4*  MCV 87.3 89.7 88.0  PLT 266 276 790   Basic Metabolic Panel:  Recent Labs Lab 11/28/16 1225  11/30/16 0537 12/01/16 0626 12/02/16 0607 12/02/16 1500 12/03/16 0712  NA 145  < > 139 138 138 135 138  K 3.9  < > 4.5 4.7 5.3* 5.0 4.7  CL 111  < > 106 104 104 102 104  CO2 27  < > 26 26 28 25 28   GLUCOSE 114*  < > 75 115* 90 146* 66  BUN 29*  < > 33* 37* 41* 43* 45*  CREATININE 1.47*  < > 1.74* 2.16* 2.20* 2.18* 2.10*  CALCIUM 7.7*  < > 7.6* 7.6* 7.6* 7.5* 7.5*  MG 1.5*  --   --  1.9 1.9  --   --   < > = values in this interval not displayed. GFR: Estimated Creatinine Clearance: 38.8 mL/min (A) (by C-G formula based on SCr of 2.1 mg/dL (H)). Liver Function Tests:  Recent Labs Lab 11/28/16 1225  AST 20  ALT 16  ALKPHOS 85  BILITOT 0.6  PROT 5.8*  ALBUMIN 2.2*   Cardiac Enzymes:  Recent Labs Lab 11/28/16 1225  TROPONINI 0.03*   CBG:  Recent Labs Lab 12/02/16 1138 12/02/16 1653 12/02/16 2131 12/03/16 0721 12/03/16 0746  GLUCAP 108* 135* 174* 57* 84   Radiology Studies: Dg Chest 1 View  Result Date: 12/02/2016 CLINICAL DATA:  Wheezing EXAM:  CHEST 1 VIEW COMPARISON:  11/28/2016 FINDINGS: Lungs are clear.  No pleural effusion or pneumothorax. Cardiomegaly. IMPRESSION: No evidence of acute cardiopulmonary disease. Electronically Signed   By: Julian Hy M.D.   On: 12/02/2016 10:52    Scheduled Meds: . cilostazol  100 mg Oral BID  . DULoxetine  60 mg Oral q morning - 10a  . ferrous sulfate  325 mg Oral  Q breakfast  . fluconazole  100 mg Oral Daily  . furosemide  40 mg Intravenous Q12H  . heparin subcutaneous  5,000 Units Subcutaneous Q8H  . insulin aspart  0-5 Units Subcutaneous QHS  . insulin aspart  0-9 Units Subcutaneous TID WC  . insulin aspart protamine- aspart  10 Units Subcutaneous BID WC  . metoprolol tartrate  50 mg Oral BID  . nystatin   Topical TID  . pravastatin  40 mg Oral Daily  . rOPINIRole  2 mg Oral QPM  . sodium chloride flush  3 mL Intravenous Q12H   Continuous Infusions: . sodium chloride    . cefTRIAXone (ROCEPHIN)  IV Stopped (12/02/16 1555)     LOS: 5 days   Lizett Chowning, MD FACP Hospitalist.   If 7PM-7AM, please contact night-coverage [email or web address] Password [email or web address] 12/03/2016, 9:56 AM

## 2016-12-03 NOTE — Progress Notes (Signed)
Place on BiPAP about 2130 , came back to find she had pulled mask off in her sleep. Placed back on straighten patient in bed with help of nurse. Rechecked machine BiPAP

## 2016-12-03 NOTE — Clinical Social Work Note (Signed)
LCSW spoke with Kerby Less with [employer].  Patient's authorization for SNF was started.       Fenix Ruppe, Clydene Pugh, LCSW

## 2016-12-04 DIAGNOSIS — I5043 Acute on chronic combined systolic (congestive) and diastolic (congestive) heart failure: Secondary | ICD-10-CM

## 2016-12-04 LAB — GLUCOSE, CAPILLARY
GLUCOSE-CAPILLARY: 107 mg/dL — AB (ref 65–99)
GLUCOSE-CAPILLARY: 126 mg/dL — AB (ref 65–99)
GLUCOSE-CAPILLARY: 105 mg/dL — AB (ref 65–99)
GLUCOSE-CAPILLARY: 143 mg/dL — AB (ref 65–99)

## 2016-12-04 NOTE — Progress Notes (Signed)
PROGRESS NOTE    Mary [name]  ZOX:[id number] DOB: Feb 18, 1946 DOA: 11/28/2016 PCP: Celene Squibb, MD    Brief Narrative: Patient was admitted for SOB, felt to be due to multiple factors including morbid obesity, volume overloaded, non compliance with CPAP for OSA.  She also has CKD III, with worsening Cr, HTN, HLD, s/p right BKA.  She is awaiting SNF at this time.   ECHO showed new low EF 35 percent, and cardiology felt she will need ischemic work up as well.  Medication was changed a little, changing BB to Metoprolol succinate, hydralazine for better BP control, and continue with IV Diuresis.     Assessment & Plan:   Active Problems:   Acute on chronic diastolic heart failure (HCC)   Diabetes mellitus with neuropathy   OSA (obstructive sleep apnea)   Hyperlipidemia   Hypertension   Morbid obesity ([place])   Candidiasis of breast   Hx of right BKA (HCC)   CHF exacerbation ([place])   Fall   Acute on chronic diastolic CHF (congestive heart failure) (HCC)   CKD (chronic kidney disease), stage III   Cardiomyopathy ([place])   Left ventricular dysfunction   Acute on chronic combined systolic and diastolic CHF (congestive heart failure) (HCC)   Right ventricular dysfunction  Acute on chronic diastolic congestive heart failure:  Will continue with diuresis per cardiology recommendation.  Will need ischemic work up, and consider adding ASA at some point.    Diabetes with complications of nephropathy, PVD, right BKA - Has been on Humalog at home, here we have substituted with NovoLog - CBGs have been reasonably stable so far - keep on Neurontin but dose is lower than home (at home pt is on 800 mg TID), due to worsening renal function   HTN, essential - Reasonably stable - Currently on Lasix 40 mg IV twice a day and metoprolol 50 mg by mouth twice a day -Hydralazine added for better BP control.   Obstructive sleep apnea - With known history of noncompliance with CPAP - Compliance  discussed and patient verbalizes understanding. - Appreciate Dr Miguel Rota consultation.  - Will d/c sedative including Cymbalta and Ultram.   Candidiasis, breast  - Had finished 7 days of Diflucan.   Hypomagnesemia - Has been supplemented, within normal limits  Anemia of chronic disease - diabetes and OSA - no evidence of active bleeding - Hg overall stable   Hyperkalemia:  likeky from decreased GFR.  Supplement d/c.  Repeated OK.   HLD - Keep on statin  Fall - Plan to discharge to skilled nursing facility once medically cleared  CKD stage III - Creatinine has been steadily increasing while patient on Lasix - We'll follow-up on cardiology team recommendations regarding Lasix dosing - Very low threshold for consultation with nephrologist if creatinine does not improve - Cr elevated but stable.   Morbid obesity - Body mass index is 45.87 kg/m.  DVT prophylaxis:Patient has been on Lovenox SQ however, given worsening creatinine, will change to heparin Code Status:Full code Family Communication:Patient and family at bedside Disposition Plan:Plan to discharge to skilled nursing facility once cardiology team clears  Consultants:  Cardiology  Procedures:  None  Antimicrobials:   Diflucan 11/28/2016 -->  Antimicrobials: Anti-infectives    Start     Dose/Rate Route Frequency Ordered Stop   12/02/16 1400  cefTRIAXone (ROCEPHIN) 1 g in dextrose 5 % 50 mL IVPB     1 g 100 mL/hr over 30 Minutes Intravenous Every 24 hours 12/02/16 1346  11/28/16 1900  fluconazole (DIFLUCAN) tablet 100 mg     100 mg Oral Daily 11/28/16 1812         Subjective:  Continue to feel better each day.   Objective: Vitals:   12/03/16 2056 12/03/16 2205 12/04/16 0505 12/04/16 1345  BP: 128/61  (!) 153/73 (!) 157/67  Pulse: 69 65 68 72  Resp: 18 16 18 18   Temp: 98.5 F (36.9 C)  97.7 F (36.5 C) (!) 97.5 F (36.4 C)  TempSrc: Oral  Oral   SpO2: 99% 98% 100%  100%  Weight:   (!) 144.2 kg (317 lb 14.5 oz)   Height:        Intake/Output Summary (Last 24 hours) at 12/04/16 1601 Last data filed at 12/04/16 1524  Gross per 24 hour  Intake              410 ml  Output             4350 ml  Net            -3940 ml   Filed Weights   12/02/16 0419 12/03/16 0615 12/04/16 0505  Weight: (!) 145 kg (319 lb 11.2 oz) (!) 143.4 kg (316 lb 3.2 oz) (!) 144.2 kg (317 lb 14.5 oz)    Examination:  General exam: Appears calm and comfortable  Respiratory system: Clear to auscultation. Respiratory effort normal. Cardiovascular system: S1 & S2 heard, RRR. No JVD, murmurs, rubs, gallops or clicks. No pedal edema. Gastrointestinal system: Abdomen is nondistended, soft and nontender. No organomegaly or masses felt. Normal bowel sounds heard. Central nervous system: Alert and oriented. No focal neurological deficits. Extremities: Symmetric 5 x 5 power. Skin: No rashes, lesions or ulcers Psychiatry: Judgement and insight appear normal. Mood & affect appropriate.   Data Reviewed: I have personally reviewed following labs and imaging studies  CBC:  Recent Labs Lab 11/28/16 1225 12/02/16 0607 12/03/16 0712  WBC 6.7 6.5 10.3  NEUTROABS 4.6  --   --   HGB 9.7* 10.1* 9.9*  HCT 30.9* 33.2* 31.4*  MCV 87.3 89.7 88.0  PLT 266 276 500   Basic Metabolic Panel:  Recent Labs Lab 11/28/16 1225  11/30/16 0537 12/01/16 0626 12/02/16 0607 12/02/16 1500 12/03/16 0712  NA 145  < > 139 138 138 135 138  K 3.9  < > 4.5 4.7 5.3* 5.0 4.7  CL 111  < > 106 104 104 102 104  CO2 27  < > 26 26 28 25 28   GLUCOSE 114*  < > 75 115* 90 146* 66  BUN 29*  < > 33* 37* 41* 43* 45*  CREATININE 1.47*  < > 1.74* 2.16* 2.20* 2.18* 2.10*  CALCIUM 7.7*  < > 7.6* 7.6* 7.6* 7.5* 7.5*  MG 1.5*  --   --  1.9 1.9  --   --   < > = values in this interval not displayed. GFR: Estimated Creatinine Clearance: 38.9 mL/min (A) (by C-G formula based on SCr of 2.1 mg/dL (H)). Liver Function  Tests:  Recent Labs Lab 11/28/16 1225  AST 20  ALT 16  ALKPHOS 85  BILITOT 0.6  PROT 5.8*  ALBUMIN 2.2*   Cardiac Enzymes:  Recent Labs Lab 11/28/16 1225  TROPONINI 0.03*   CBG:  Recent Labs Lab 12/03/16 1112 12/03/16 1635 12/03/16 2101 12/04/16 0720 12/04/16 1111  GLUCAP 109* 133* 158* 107* 126*    Recent Results (from the past 240 hour(s))  Culture, Urine  Status: Abnormal (Preliminary result)   Collection Time: 12/02/16  1:46 PM  Result Value Ref Range Status   Specimen Description URINE, CLEAN CATCH  Final   Special Requests NONE  Final   Culture (A)  Final    20,000 COLONIES/mL ESCHERICHIA COLI SUSCEPTIBILITIES TO FOLLOW Performed at [facility], [address] [place], [place] [address]    Report Status PENDING  Incomplete     Radiology Studies: No results found.  Scheduled Meds: . cilostazol  100 mg Oral BID  . ferrous sulfate  325 mg Oral Q breakfast  . fluconazole  100 mg Oral Daily  . furosemide  40 mg Intravenous Q12H  . heparin subcutaneous  5,000 Units Subcutaneous Q8H  . hydrALAZINE  10 mg Oral Q8H  . insulin aspart  0-5 Units Subcutaneous QHS  . insulin aspart  0-9 Units Subcutaneous TID WC  . insulin aspart protamine- aspart  8 Units Subcutaneous BID WC  . metoprolol succinate  50 mg Oral BID  . nystatin   Topical TID  . pravastatin  40 mg Oral Daily  . rOPINIRole  2 mg Oral QPM  . sodium chloride flush  3 mL Intravenous Q12H   Continuous Infusions: . sodium chloride    . cefTRIAXone (ROCEPHIN)  IV Stopped (12/04/16 1420)     LOS: 6 days   Sima Lindenberger, MD FACP Hospitalist.   If 7PM-7AM, please contact night-coverage [email or web address] Password [email or web address] 12/04/2016, 4:01 PM

## 2016-12-04 NOTE — Progress Notes (Signed)
Patient off BiPAP at 4 when checked on.

## 2016-12-04 NOTE — Progress Notes (Signed)
Subjective: She says she feels better. She did try the CPAP last night and she was able to tolerate it for about half the night. She is much more awake this morning. She has no new complaints.  Objective: Vital signs in last 24 hours: Temp:  [97.6 F (36.4 C)-98.5 F (36.9 C)] 97.7 F (36.5 C) (07/07 0505) Pulse Rate:  [65-69] 68 (07/07 0505) Resp:  [16-18] 18 (07/07 0505) BP: (128-153)/(61-73) 153/73 (07/07 0505) SpO2:  [98 %-100 %] 100 % (07/07 0505) Weight:  [144.2 kg (317 lb 14.5 oz)] 144.2 kg (317 lb 14.5 oz) (07/07 0505) Weight change: 0.773 kg (1 lb 11.3 oz) Last BM Date: 12/03/16  Intake/Output from previous day: 07/06 0701 - 07/07 0700 In: 770 [P.O.:720; IV Piggyback:50] Out: 3250 [Urine:3250]  PHYSICAL EXAM General appearance: alert, cooperative and no distress Resp: clear to auscultation bilaterally Cardio: regular rate and rhythm, S1, S2 normal, no murmur, click, rub or gallop GI: soft, non-tender; bowel sounds normal; no masses,  no organomegaly Extremities: She has 1+ pitting edema on the left and has had an amputation of her right leg Skin warm and dry  Lab Results:  Results for orders placed or performed during the hospital encounter of 11/28/16 (from the past 48 hour(s))  Glucose, capillary     Status: Abnormal   Collection Time: 12/02/16 11:38 AM  Result Value Ref Range   Glucose-Capillary 108 (H) 65 - 99 mg/dL   Comment 1 Notify RN    Comment 2 Document in Chart   Culture, Urine     Status: Abnormal (Preliminary result)   Collection Time: 12/02/16  1:46 PM  Result Value Ref Range   Specimen Description URINE, CLEAN CATCH    Special Requests NONE    Culture 20,000 COLONIES/mL GRAM NEGATIVE RODS (A)    Report Status PENDING   Basic metabolic panel     Status: Abnormal   Collection Time: 12/02/16  3:00 PM  Result Value Ref Range   Sodium 135 135 - 145 mmol/L   Potassium 5.0 3.5 - 5.1 mmol/L   Chloride 102 101 - 111 mmol/L   CO2 25 22 - 32 mmol/L    Glucose, Bld 146 (H) 65 - 99 mg/dL   BUN 43 (H) 6 - 20 mg/dL   Creatinine, Ser 2.18 (H) 0.44 - 1.00 mg/dL   Calcium 7.5 (L) 8.9 - 10.3 mg/dL   GFR calc non Af Amer 22 (L) >60 mL/min   GFR calc Af Amer 25 (L) >60 mL/min    Comment: (NOTE) The eGFR has been calculated using the CKD EPI equation. This calculation has not been validated in all clinical situations. eGFR's persistently <60 mL/min signify possible Chronic Kidney Disease.    Anion gap 8 5 - 15  Glucose, capillary     Status: Abnormal   Collection Time: 12/02/16  4:53 PM  Result Value Ref Range   Glucose-Capillary 135 (H) 65 - 99 mg/dL   Comment 1 Notify RN    Comment 2 Document in Chart   Glucose, capillary     Status: Abnormal   Collection Time: 12/02/16  9:31 PM  Result Value Ref Range   Glucose-Capillary 174 (H) 65 - 99 mg/dL   Comment 1 Notify RN    Comment 2 Document in Chart   Basic metabolic panel     Status: Abnormal   Collection Time: 12/03/16  7:12 AM  Result Value Ref Range   Sodium 138 135 - 145 mmol/L   Potassium 4.7  3.5 - 5.1 mmol/L   Chloride 104 101 - 111 mmol/L   CO2 28 22 - 32 mmol/L   Glucose, Bld 66 65 - 99 mg/dL   BUN 45 (H) 6 - 20 mg/dL   Creatinine, Ser 2.10 (H) 0.44 - 1.00 mg/dL   Calcium 7.5 (L) 8.9 - 10.3 mg/dL   GFR calc non Af Amer 23 (L) >60 mL/min   GFR calc Af Amer 26 (L) >60 mL/min    Comment: (NOTE) The eGFR has been calculated using the CKD EPI equation. This calculation has not been validated in all clinical situations. eGFR's persistently <60 mL/min signify possible Chronic Kidney Disease.    Anion gap 6 5 - 15  CBC     Status: Abnormal   Collection Time: 12/03/16  7:12 AM  Result Value Ref Range   WBC 10.3 4.0 - 10.5 K/uL   RBC 3.57 (L) 3.87 - 5.11 MIL/uL   Hemoglobin 9.9 (L) 12.0 - 15.0 g/dL   HCT 31.4 (L) 36.0 - 46.0 %   MCV 88.0 78.0 - 100.0 fL   MCH 27.7 26.0 - 34.0 pg   MCHC 31.5 30.0 - 36.0 g/dL   RDW 16.2 (H) 11.5 - 15.5 %   Platelets 242 150 - 400 K/uL   Glucose, capillary     Status: Abnormal   Collection Time: 12/03/16  7:21 AM  Result Value Ref Range   Glucose-Capillary 57 (L) 65 - 99 mg/dL  Glucose, capillary     Status: None   Collection Time: 12/03/16  7:46 AM  Result Value Ref Range   Glucose-Capillary 84 65 - 99 mg/dL  Glucose, capillary     Status: Abnormal   Collection Time: 12/03/16 11:12 AM  Result Value Ref Range   Glucose-Capillary 109 (H) 65 - 99 mg/dL  Glucose, capillary     Status: Abnormal   Collection Time: 12/03/16  4:35 PM  Result Value Ref Range   Glucose-Capillary 133 (H) 65 - 99 mg/dL  Glucose, capillary     Status: Abnormal   Collection Time: 12/03/16  9:01 PM  Result Value Ref Range   Glucose-Capillary 158 (H) 65 - 99 mg/dL   Comment 1 Notify RN    Comment 2 Document in Chart   Glucose, capillary     Status: Abnormal   Collection Time: 12/04/16  7:20 AM  Result Value Ref Range   Glucose-Capillary 107 (H) 65 - 99 mg/dL    ABGS No results for input(s): PHART, PO2ART, TCO2, HCO3 in the last 72 hours.  Invalid input(s): PCO2 CULTURES Recent Results (from the past 240 hour(s))  Culture, Urine     Status: Abnormal (Preliminary result)   Collection Time: 12/02/16  1:46 PM  Result Value Ref Range Status   Specimen Description URINE, CLEAN CATCH  Final   Special Requests NONE  Final   Culture 20,000 COLONIES/mL GRAM NEGATIVE RODS (A)  Final   Report Status PENDING  Incomplete   Studies/Results: Dg Chest 1 View  Result Date: 12/02/2016 CLINICAL DATA:  Wheezing EXAM: CHEST 1 VIEW COMPARISON:  11/28/2016 FINDINGS: Lungs are clear.  No pleural effusion or pneumothorax. Cardiomegaly. IMPRESSION: No evidence of acute cardiopulmonary disease. Electronically Signed   By: Julian Hy M.D.   On: 12/02/2016 10:52    Medications:  Prior to Admission:  Prescriptions Prior to Admission  Medication Sig Dispense Refill Last Dose  . cholecalciferol (VITAMIN D) 1000 UNITS tablet Take 1,000 Units by mouth  daily.    Past Month  at Unknown time  . cilostazol (PLETAL) 100 MG tablet Take 100 mg by mouth 2 (two) times daily.    Past Week at Unknown time  . compounded topicals builder Apply 1 application topically. KET/DIC/BAC/LID/M/GUA   Past Week at Unknown time  . DULoxetine (CYMBALTA) 60 MG capsule Take 60 mg by mouth every morning.    Past Week at Unknown time  . ferrous sulfate 325 (65 FE) MG tablet Take 650 mg by mouth 2 (two) times daily with a meal.    Past Week at Unknown time  . furosemide (LASIX) 20 MG tablet Take 1 tablet (20 mg total) by mouth daily. (Patient taking differently: Take 20 mg by mouth 2 (two) times daily. ) 90 tablet 3 11/28/2016 at Unknown time  . gabapentin (NEURONTIN) 800 MG tablet Take 800 mg by mouth 3 (three) times daily.    Past Week at Unknown time  . insulin lispro protamine-lispro (HUMALOG 50/50 MIX) (50-50) 100 UNIT/ML SUSP injection Inject 0.2 mLs (20 Units total) into the skin 3 (three) times daily. (Patient taking differently: Inject 20-25 Units into the skin 3 (three) times daily. Patient self adjusts amount based on sugar levels) 10 mL 11 Past Week at Unknown time  . lidocaine-prilocaine (EMLA) cream Apply 1 application topically as needed.    Past Week at Unknown time  . lisinopril-hydrochlorothiazide (PRINZIDE,ZESTORETIC) 20-25 MG tablet Take 1 tablet by mouth daily. 30 tablet 11 Past Week at Unknown time  . metFORMIN (GLUCOPHAGE) 1000 MG tablet Take 1,000 mg by mouth 2 (two) times daily with a meal.    Past Week at Unknown time  . metoprolol (LOPRESSOR) 50 MG tablet TAKE (1) TABLET BY MOUTH TWICE DAILY. 60 tablet 0 Past Week at 1800  . Multiple Vitamin (MULTIVITAMIN WITH MINERALS) TABS Take 1 tablet by mouth once a week.    Past Week at Unknown time  . pravastatin (PRAVACHOL) 40 MG tablet Take 40 mg by mouth daily.   Past Week at Unknown time  . rOPINIRole (REQUIP) 2 MG tablet Take 2 mg by mouth every evening.    Past Week at Unknown time  . Tolnaftate (ABSORBINE  JR EX) Apply 1 application topically daily as needed (for pain).   Past Week at Unknown time  . traMADol (ULTRAM) 50 MG tablet Take 1 tablet (50 mg total) by mouth every 6 (six) hours as needed. 15 tablet 0 Past Week at Unknown time  . potassium chloride (K-DUR) 10 MEQ tablet Take 1 tablet (10 mEq total) by mouth daily. 90 tablet 3 Taking   Scheduled: . cilostazol  100 mg Oral BID  . DULoxetine  60 mg Oral q morning - 10a  . ferrous sulfate  325 mg Oral Q breakfast  . fluconazole  100 mg Oral Daily  . furosemide  40 mg Intravenous Q12H  . heparin subcutaneous  5,000 Units Subcutaneous Q8H  . hydrALAZINE  10 mg Oral Q8H  . insulin aspart  0-5 Units Subcutaneous QHS  . insulin aspart  0-9 Units Subcutaneous TID WC  . insulin aspart protamine- aspart  8 Units Subcutaneous BID WC  . metoprolol succinate  50 mg Oral BID  . nystatin   Topical TID  . pravastatin  40 mg Oral Daily  . rOPINIRole  2 mg Oral QPM  . sodium chloride flush  3 mL Intravenous Q12H   Continuous: . sodium chloride    . cefTRIAXone (ROCEPHIN)  IV Stopped (12/03/16 1537)   MBT:DHRCBU chloride, acetaminophen, ipratropium-albuterol, ondansetron (ZOFRAN) IV,  sodium chloride flush, traMADol  Assesment: She was admitted with acute on chronic combined systolic and diastolic heart failure. She has right ventricular dysfunction as well. A lot of her problem is made worse by obstructive sleep apnea and she did use CPAP last night. She says that she thinks she will go to a skilled care facility at discharge and we can send her out on CPAP with a nasal mask which she has tolerated. She did not tolerate a full facemask earlier. Active Problems:   Acute on chronic diastolic heart failure (HCC)   Diabetes mellitus with neuropathy   OSA (obstructive sleep apnea)   Hyperlipidemia   Hypertension   Morbid obesity (HCC)   Candidiasis of breast   Hx of right BKA (HCC)   CHF exacerbation ([place])   Fall   Acute on chronic diastolic CHF  (congestive heart failure) (HCC)   CKD (chronic kidney disease), stage III   Cardiomyopathy ([place])   Left ventricular dysfunction   Acute on chronic combined systolic and diastolic CHF (congestive heart failure) ([place])   Right ventricular dysfunction    Plan: As above continue CPAP here as long as she's in the hospital and let her go home with CPAP to skilled care facility if that is the discharge plan    LOS: 6 days   Aurthur Wingerter L 12/04/2016, 9:27 AM

## 2016-12-04 NOTE — Progress Notes (Addendum)
Placed on nasal BiPAP 12/4 2lpm oxygen 98 saturation. Patient stayed on for about 30 minutes had a panic attack and asked to be removed. Will try again in a short period.

## 2016-12-05 LAB — GLUCOSE, CAPILLARY
GLUCOSE-CAPILLARY: 108 mg/dL — AB (ref 65–99)
GLUCOSE-CAPILLARY: 191 mg/dL — AB (ref 65–99)
GLUCOSE-CAPILLARY: 156 mg/dL — AB (ref 65–99)
Glucose-Capillary: 124 mg/dL — ABNORMAL HIGH (ref 65–99)

## 2016-12-05 LAB — URINE CULTURE

## 2016-12-05 MED ORDER — CARBIDOPA-LEVODOPA 10-100 MG PO TABS
1.0000 | ORAL_TABLET | Freq: Every evening | ORAL | Status: DC | PRN
  Administered 2016-12-05: 1 via ORAL
  Filled 2016-12-05 (×3): qty 1

## 2016-12-05 NOTE — Progress Notes (Signed)
On BiPAP again 12/4 oxygen 2lpm saturation 97

## 2016-12-05 NOTE — Progress Notes (Signed)
PROGRESS NOTE    Mary [name]  TIR:[id number] DOB: [birth year] DOA: 11/28/2016 PCP: Celene Squibb, MD   Brief Narrative: Patient was admitted for SOB, felt to be due to multiple factors including morbid obesity, volume overloaded, non compliance with CPAP for OSA. She also has CKD III, with worsening Cr, HTN, HLD, s/p right BKA. She is awaiting SNF at this time.   ECHO showed new low EF 35 percent, and cardiology felt she will need ischemic work up as well.  Medication was changed a little, changing BB to Metoprolol succinate, hydralazine for better BP control, and continue with IV Diuresis.  She is clearly improving.  She would like to have medication for RLS.    Assessment & Plan:   Active Problems:   Acute on chronic diastolic heart failure (HCC)   Diabetes mellitus with neuropathy   OSA (obstructive sleep apnea)   Hyperlipidemia   Hypertension   Morbid obesity ([place])   Candidiasis of breast   Hx of right BKA (HCC)   CHF exacerbation ([place])   Fall   Acute on chronic diastolic CHF (congestive heart failure) (HCC)   CKD (chronic kidney disease), stage III   Cardiomyopathy ([place])   Left ventricular dysfunction   Acute on chronic combined systolic and diastolic CHF (congestive heart failure) (HCC)   Right ventricular dysfunction  1/ Acute on chronic diastolic congestive heart failure: Will continue with diuresis per cardiology recommendation. Will need ischemic work up, and consider adding ASA at some point.    2/   Diabetes with complications of nephropathy, PVD, right BKA - Has been on Humalog at home, here we have substituted with NovoLog - CBGs have been reasonably stable so far - keep on Neurontin but dose is lower than home (at home pt is on 800 mg TID), due to worsening renal function   3/  HTN, essential - Reasonably stable - Currently on Lasix 40 mg IV twice a day and metoprolol 50 mg by mouth twice a day -Hydralazine added for better BP control.   4/    Obstructive sleep apnea - With known history of noncompliance with CPAP - Compliance discussed and patient verbalizes understanding. - Appreciate Dr Miguel Rota consultation.  - Will d/c sedative including Cymbalta and Ultram.   5/   Candidiasis, breast  - Had finished 7 days of Diflucan.   6/   Hypomagnesemia - Has been supplemented, within normal limits  7/   Anemia of chronic disease - diabetes and OSA - no evidence of active bleeding - Hg overall stable   8/   Hyperkalemia: likeky from decreased GFR. Supplement d/c. Repeated OK.   9/   HLD - Keep on statin  10/   Fall - Plan to discharge to skilled nursing facility once medically cleared  11/   CKD stage III - Creatinine has been steadily increasing while patient on Lasix - We'll follow-up on cardiology team recommendations regarding Lasix dosing - Very low threshold for consultation with nephrologist if creatinine does not improve - Cr elevated but stable.   12/   Morbid obesity - Body mass index is 45.87 kg/m.  13/   RLS:  Will give sinemet.  It is non sedating.   14/    UTI:  E coli, sen to Rocephin, s/p IV Rocephin.   DVT prophylaxis:Patient has been on Lovenox SQ however, given worsening creatinine, will change to heparin Code Status:Full code Family Communication:Patient and family at bedside Disposition Plan:Plan to discharge to skilled nursing  facility once cardiology team clears  Consultants:  Cardiology.  Pulmonary.   Procedures:  None  Antimicrobials:   Diflucan 11/28/2016 -->   Antimicrobials: Anti-infectives    Start     Dose/Rate Route Frequency Ordered Stop   12/02/16 1400  cefTRIAXone (ROCEPHIN) 1 g in dextrose 5 % 50 mL IVPB     1 g 100 mL/hr over 30 Minutes Intravenous Every 24 hours 12/02/16 1346     11/28/16 1900  fluconazole (DIFLUCAN) tablet 100 mg  Status:  Discontinued     100 mg Oral Daily 11/28/16 1812 12/04/16 1616       Subjective:  She  wanted to get out of bed and walk.   Objective: Vitals:   12/04/16 2035 12/04/16 2216 12/05/16 0040 12/05/16 0514  BP: (!) 154/70   (!) 174/80  Pulse: 76 75 78 76  Resp: 20 16 16 20   Temp: 98.3 F (36.8 C)   97.9 F (36.6 C)  TempSrc: Oral   Oral  SpO2: 98% 98% 97% 95%  Weight:    (!) 136.9 kg (301 lb 11.2 oz)  Height:        Intake/Output Summary (Last 24 hours) at 12/05/16 0937 Last data filed at 12/05/16 0720  Gross per 24 hour  Intake              290 ml  Output             6500 ml  Net            -6210 ml   Filed Weights   12/03/16 0615 12/04/16 0505 12/05/16 0514  Weight: (!) 143.4 kg (316 lb 3.2 oz) (!) 144.2 kg (317 lb 14.5 oz) (!) 136.9 kg (301 lb 11.2 oz)    Examination:  General exam: Appears calm and comfortable  Respiratory system: Clear to auscultation. Respiratory effort normal. Cardiovascular system: S1 & S2 heard, RRR. No JVD, murmurs, rubs, gallops or clicks. No pedal edema. Gastrointestinal system: Abdomen is nondistended, soft and nontender. No organomegaly or masses felt. Normal bowel sounds heard. Central nervous system: Alert and oriented. No focal neurological deficits. Extremities: Symmetric 5 x 5 power. Skin: No rashes, lesions or ulcers Psychiatry: Judgement and insight appear normal. Mood & affect appropriate.   Data Reviewed: I have personally reviewed following labs and imaging studies  CBC:  Recent Labs Lab 11/28/16 1225 12/02/16 0607 12/03/16 0712  WBC 6.7 6.5 10.3  NEUTROABS 4.6  --   --   HGB 9.7* 10.1* 9.9*  HCT 30.9* 33.2* 31.4*  MCV 87.3 89.7 88.0  PLT 266 276 097   Basic Metabolic Panel:  Recent Labs Lab 11/28/16 1225  11/30/16 0537 12/01/16 0626 12/02/16 0607 12/02/16 1500 12/03/16 0712  NA 145  < > 139 138 138 135 138  K 3.9  < > 4.5 4.7 5.3* 5.0 4.7  CL 111  < > 106 104 104 102 104  CO2 27  < > 26 26 28 25 28   GLUCOSE 114*  < > 75 115* 90 146* 66  BUN 29*  < > 33* 37* 41* 43* 45*  CREATININE 1.47*  < >  1.74* 2.16* 2.20* 2.18* 2.10*  CALCIUM 7.7*  < > 7.6* 7.6* 7.6* 7.5* 7.5*  MG 1.5*  --   --  1.9 1.9  --   --   < > = values in this interval not displayed. GFR: Estimated Creatinine Clearance: 37.7 mL/min (A) (by C-G formula based on SCr of 2.1 mg/dL (H)).  Liver Function Tests:  Recent Labs Lab 11/28/16 1225  AST 20  ALT 16  ALKPHOS 85  BILITOT 0.6  PROT 5.8*  ALBUMIN 2.2*   Cardiac Enzymes:  Recent Labs Lab 11/28/16 1225  TROPONINI 0.03*   CBG:  Recent Labs Lab 12/04/16 0720 12/04/16 1111 12/04/16 1608 12/04/16 2034 12/05/16 0716  GLUCAP 107* 126* 105* 143* 108*    Recent Results (from the past 240 hour(s))  Culture, Urine     Status: Abnormal   Collection Time: 12/02/16  1:46 PM  Result Value Ref Range Status   Specimen Description URINE, CLEAN CATCH  Final   Special Requests NONE  Final   Culture 20,000 COLONIES/mL ESCHERICHIA COLI (A)  Final   Report Status 12/05/2016 FINAL  Final   Organism ID, Bacteria ESCHERICHIA COLI (A)  Final      Susceptibility   Escherichia coli - MIC*    AMPICILLIN >=32 RESISTANT Resistant     CEFAZOLIN <=4 SENSITIVE Sensitive     CEFTRIAXONE <=1 SENSITIVE Sensitive     CIPROFLOXACIN <=0.25 SENSITIVE Sensitive     GENTAMICIN <=1 SENSITIVE Sensitive     IMIPENEM <=0.25 SENSITIVE Sensitive     NITROFURANTOIN <=16 SENSITIVE Sensitive     TRIMETH/SULFA >=320 RESISTANT Resistant     AMPICILLIN/SULBACTAM 16 INTERMEDIATE Intermediate     PIP/TAZO <=4 SENSITIVE Sensitive     Extended ESBL NEGATIVE Sensitive     * 20,000 COLONIES/mL ESCHERICHIA COLI     Radiology Studies: No results found.  Scheduled Meds: . cilostazol  100 mg Oral BID  . ferrous sulfate  325 mg Oral Q breakfast  . furosemide  40 mg Intravenous Q12H  . heparin subcutaneous  5,000 Units Subcutaneous Q8H  . hydrALAZINE  10 mg Oral Q8H  . insulin aspart  0-5 Units Subcutaneous QHS  . insulin aspart  0-9 Units Subcutaneous TID WC  . insulin aspart protamine-  aspart  8 Units Subcutaneous BID WC  . metoprolol succinate  50 mg Oral BID  . nystatin   Topical TID  . pravastatin  40 mg Oral Daily  . rOPINIRole  2 mg Oral QPM  . sodium chloride flush  3 mL Intravenous Q12H   Continuous Infusions: . sodium chloride    . cefTRIAXone (ROCEPHIN)  IV Stopped (12/04/16 1420)     LOS: 7 days   Dali Kraner, MD FACP Hospitalist.   If 7PM-7AM, please contact night-coverage [email or web address] Password [facility] 12/05/2016, 9:37 AM

## 2016-12-05 NOTE — Clinical Social Work Note (Signed)
CSW received insurance auth from [place] #[id number]. Provided to [place] at [facility]. RN Jacqlyn Larsen stated will d/c on Monday. CSW continuing to follow for discharge needs.   Oretha Ellis, [place], [place] Work [facility] Coverage) [phone]

## 2016-12-06 ENCOUNTER — Telehealth: Payer: Self-pay

## 2016-12-06 DIAGNOSIS — Z79899 Other long term (current) drug therapy: Secondary | ICD-10-CM

## 2016-12-06 LAB — GLUCOSE, CAPILLARY
GLUCOSE-CAPILLARY: 121 mg/dL — AB (ref 65–99)
GLUCOSE-CAPILLARY: 107 mg/dL — AB (ref 65–99)

## 2016-12-06 MED ORDER — HYDRALAZINE HCL 25 MG PO TABS: 25.0000 mg | ORAL_TABLET | Freq: Three times a day (TID) | ORAL | Status: DC

## 2016-12-06 MED ORDER — ACETAMINOPHEN 325 MG PO TABS: 650.0000 mg | ORAL_TABLET | ORAL | 0 refills | Status: DC | PRN

## 2016-12-06 MED ORDER — ISOSORBIDE MONONITRATE ER 30 MG PO TB24
15.0000 mg | ORAL_TABLET | Freq: Every day | ORAL | Status: DC
  Administered 2016-12-06: 15 mg via ORAL
  Filled 2016-12-06: qty 1

## 2016-12-06 MED ORDER — FUROSEMIDE 40 MG PO TABS: 40.0000 mg | ORAL_TABLET | Freq: Two times a day (BID) | ORAL | 1 refills | Status: DC

## 2016-12-06 MED ORDER — HYDRALAZINE HCL 25 MG PO TABS: 25.0000 mg | ORAL_TABLET | Freq: Three times a day (TID) | ORAL | 1 refills | Status: DC

## 2016-12-06 MED ORDER — METOPROLOL SUCCINATE ER 50 MG PO TB24: 50.0000 mg | ORAL_TABLET | Freq: Two times a day (BID) | ORAL | 1 refills | Status: DC

## 2016-12-06 MED ORDER — FUROSEMIDE 40 MG PO TABS: 40.0000 mg | ORAL_TABLET | Freq: Two times a day (BID) | ORAL | Status: DC

## 2016-12-06 MED ORDER — ISOSORBIDE MONONITRATE ER 30 MG PO TB24: 15.0000 mg | ORAL_TABLET | Freq: Every day | ORAL | 2 refills | Status: DC

## 2016-12-06 MED ORDER — CARBIDOPA-LEVODOPA 25-100 MG PO TABS: 1.0000 | ORAL_TABLET | Freq: Every evening | ORAL | 2 refills | Status: DC | PRN

## 2016-12-06 NOTE — Clinical Social Work Placement (Signed)
   CLINICAL SOCIAL WORK PLACEMENT  NOTE  Date:  12/06/2016  Patient Details  Name: Mary [name] MRN: [id number] Date of Birth: 12-02-1945  Clinical Social Work is seeking post-discharge placement for this patient at the [place] level of care (*CSW will initial, date and re-position this form in  chart as items are completed):  Yes   Patient/family provided with [place] Work Department's list of facilities offering this level of care within the geographic area requested by the patient (or if unable, by the patient's family).  Yes   Patient/family informed of their freedom to choose among providers that offer the needed level of care, that participate in Medicare, Medicaid or managed care program needed by the patient, have an available bed and are willing to accept the patient.  Yes   Patient/family informed of [facility]'s ownership interest in [facility] and [facility], as well as of the fact that they are under no obligation to receive care at these facilities.  PASRR submitted to EDS on 12/06/16     PASRR number received on 12/06/16     Existing PASRR number confirmed on       FL2 transmitted to all facilities in geographic area requested by pt/family on 12/06/16     FL2 transmitted to all facilities within larger geographic area on       Patient informed that his/her managed care company has contracts with or will negotiate with certain facilities, including the following:        Yes   Patient/family informed of bed offers received.  Patient [facility] bed at [facility]     Physician recommends and patient chooses bed at [facility]    Patient to be transferred to [facility] on 12/06/16.  Patient to be transferred to facility by EMS     Patient family notified on 12/06/16 of transfer.  Name of family member notified:  HUsband (in room)     PHYSICIAN Please sign FL2     Additional Comment:     _______________________________________________ Lilly Cove, LCSW 12/06/2016, 11:01 AM

## 2016-12-06 NOTE — Progress Notes (Signed)
Patient does try to wear BiPAP though she does have some anxiety issues. Hopefully she will get better with wearing it.

## 2016-12-06 NOTE — Progress Notes (Addendum)
LCSW following for disposition: New SNF placement  [employer]-auth [id number] and facility has insurance preauth Bed available for patient to admit today if medically stable. Medical team made aware. MD agreeable with discharge to SNF today [facility] is aware of patient and agreeable.  All information sent via [place].   Will transport patient by EMS Husband in room and agreeable to plan.  Will await plans and orders and continue to follow for admission to SNF/discharge   Lane Hacker, MSW Clinical Social Work: [employer] Coverage for :  [phone]

## 2016-12-06 NOTE — Discharge Summary (Signed)
Physician Discharge Summary  [name] URK:[id number] DOB: 23-Jun-1945 DOA: 11/28/2016  PCP: Celene Squibb, MD  Admit date: 11/28/2016 Discharge date: 12/06/2016  Admitted From: Home.  Disposition: SNF.   Recommendations for Outpatient Follow-up:  1. Follow up with PCP in 1-2 weeks 2. Follow up with pulmonary for sleep apnea. 3. Follow up with cardiology as scheduled for further ischemic work up.   Home Health: SNF. Equipment/Devices: CPAP machine.  Discharge Condition: much improved.  CODE STATUS: FULL CODE.  Diet recommendation: heart healthy diet and carb modified.   Brief/Interim Summary:  Patient was admitted mainly for SOB by Dr Roderic Palau, but many chronic problems were address during this hospitalization.  As per his H and P:  "  Mary [name] is a 71 y.o. female with medical history significant of history of diastolic heart failure, diabetes, OSA and morbid obesity. Patient reports that this morning she was walking with her walker and suffered a mechanical fall. She didn't have any loss of consciousness. No head trauma. She was evaluated in the emergency room where she also reported progressive shortness of breath for the past month. She is noting worsening edema in her lower extremities abdomen and worsening dyspnea on exertion. She also feels that she had a significant weight gain over the past several months. Review of records indicate that in 08/2016, discharge weight was noted to be 270 pounds. Weight on arrival to the emergency room was noted to be 300 pounds. No chest pain. She reports compliance with her diuretics, but feels that she may not be producing enough urine. She's not had any vomiting, diarrhea, dysuria. She is also noticed a rash under her breasts bilaterally. This is somewhat painful and erythematous.   ED Course: She was noted to be hypoxic on room air. Mild elevation of creatinine at 1.4. BNP noted to be elevated. Chest x-ray indicated decompensated CHF. She's  been referred for treatment of CHF.   HOSPITAL COURSE:   Patient was admitted for SOB, and it was felt to be due to multiple problems including morbid obesity, volume overload, and non compliance with CPAP having OSA.  Her other chronic problems including CKD III, worsening Cr, HTN, HLD, s/p right BKA.  Cardiology was consulted, and for her acute on chronic CHF, she was diuresed.  ECHO was performed, and it showed new depressed EF, and it was recommended outpatient follow up for ischemic work up.  Arrangement was made by cardiology.  She was found to have HTN, and diuretics were continued, and her BB was changed to metoprolol succinate, and hydralazine was added into her regimen.  Her Cr did increased from 1.7 to 2.1, but it leveled out, and therefore, no ARB or ACE I was used.  Her breathing did improved.  For her OSA, pulmonary was consulted, and Dr Luan Pulling assisted with her CPAP.  She was able to use CPAP at 12/4 settings with 2 L [place] bled in.  Attention was then turned to her lethargic and falling asleep all the times.  It was felt that in addition to her inadequately Tx OSA, she was on several sedative medications, and effort was to taylor her regimen.  Her Cymbalta was discontinued, narcotics were stopped, and Requip for RLS was changed to Sinemet.  She became more alert, and was clearly doing markedly better.  She complained of RLS, and we will attempt to increase her sinemet doses.  I would also consider Plaquenil for RLS syndrome as well.  Please avoid sedative meds.  She was found to have UTI  With [facility], pan sensitive, and was given IV Rocephin.  She no longer need further antibiotics.  Her fall risk could have easily been understood when her condition on admission.  Now with improvement of her mental status, it is hopeful that this fall risk is eleminated.  On this same issue, PT had recommended that she goes to SNF.  She and her husband understood and agreed.  She was accepted, and will be discharged  today.  She did have hyperkalemia, and her supplementation was discontinued.   Thank you for allowing me to participate in her care.  She will follow up with PCP, cardiology, and pulmonary next week.  Good Day.   Discharge Diagnoses:  Active Problems:   Acute on chronic diastolic heart failure (HCC)   Diabetes mellitus with neuropathy   OSA (obstructive sleep apnea)   Hyperlipidemia   Hypertension   Morbid obesity ([place])   Candidiasis of breast   Hx of right BKA (HCC)   CHF exacerbation ([place])   Fall   Acute on chronic diastolic CHF (congestive heart failure) (HCC)   CKD (chronic kidney disease), stage III   Cardiomyopathy ([place])   Left ventricular dysfunction   Acute on chronic combined systolic and diastolic CHF (congestive heart failure) ([place])   Right ventricular dysfunction    Discharge Instructions  Discharge Instructions    Diet - low sodium heart healthy    Complete by:  As directed    Discharge instructions    Complete by:  As directed    Avoid taking too much medications as they have side effects which will hurt you in the long run.  Follow up with cardiology as your heart may have blockages that will need further evaluation.  You will need to use your CPAP every night.   For home use only DME continuous positive airway pressure (CPAP)    Complete by:  As directed    Patient has OSA or probable OSA:  Yes   Is the patient currently using CPAP in the home:  No   Settings:  Autotitration   Signs and symptoms of probable OSA  (select all that apply):  Snoring   CPAP supplies needed:  Mask, headgear, cushions, filters, heated tubing and water chamber   Increase activity slowly    Complete by:  As directed      Allergies as of 12/06/2016   No Known Allergies     Medication List    STOP taking these medications   DULoxetine 60 MG capsule Commonly known as:  CYMBALTA   ferrous sulfate 325 (65 FE) MG tablet   lisinopril-hydrochlorothiazide 20-25 MG tablet Commonly  known as:  PRINZIDE,ZESTORETIC   metoprolol tartrate 50 MG tablet Commonly known as:  LOPRESSOR   multivitamin with minerals Tabs tablet   potassium chloride 10 MEQ tablet Commonly known as:  K-DUR   rOPINIRole 2 MG tablet Commonly known as:  REQUIP   traMADol 50 MG tablet Commonly known as:  ULTRAM     TAKE these medications   ABSORBINE JR EX Apply 1 application topically daily as needed (for pain).   acetaminophen 325 MG tablet Commonly known as:  TYLENOL Take 2 tablets (650 mg total) by mouth every 4 (four) hours as needed for headache or mild pain.   carbidopa-levodopa 25-100 MG tablet Commonly known as:  SINEMET Take 1 tablet by mouth at bedtime as needed (For restless leg.).   cholecalciferol 1000 units tablet Commonly known as:  VITAMIN D Take 1,000 Units by mouth daily.   cilostazol 100 MG tablet Commonly known as:  PLETAL Take 100 mg by mouth 2 (two) times daily.   COMPOUNDED TOPICALS BUILDER Apply 1 application topically. KET/DIC/BAC/LID/M/GUA   furosemide 40 MG tablet Commonly known as:  LASIX Take 1 tablet (40 mg total) by mouth 2 (two) times daily. What changed:  medication strength  how much to take  when to take this   gabapentin 800 MG tablet Commonly known as:  NEURONTIN Take 800 mg by mouth 3 (three) times daily.   hydrALAZINE 25 MG tablet Commonly known as:  APRESOLINE Take 1 tablet (25 mg total) by mouth every 8 (eight) hours.   insulin lispro protamine-lispro (50-50) 100 UNIT/ML Susp injection Commonly known as:  HUMALOG 50/50 MIX Inject 0.2 mLs (20 Units total) into the skin 3 (three) times daily. What changed:  how much to take  additional instructions   isosorbide mononitrate 30 MG 24 hr tablet Commonly known as:  IMDUR Take 0.5 tablets (15 mg total) by mouth daily.   lidocaine-prilocaine cream Commonly known as:  EMLA Apply 1 application topically as needed.   metFORMIN 1000 MG tablet Commonly known as:   GLUCOPHAGE Take 1,000 mg by mouth 2 (two) times daily with a meal.   metoprolol succinate 50 MG 24 hr tablet Commonly known as:  TOPROL-XL Take 1 tablet (50 mg total) by mouth 2 (two) times daily. Take with or immediately following a meal.   pravastatin 40 MG tablet Commonly known as:  PRAVACHOL Take 40 mg by mouth daily.            Durable Medical Equipment        Start     Ordered   12/06/16 0000  For home use only DME continuous positive airway pressure (CPAP)    Question Answer Comment  Patient has OSA or probable OSA Yes   Is the patient currently using CPAP in the home No   Settings Autotitration   Signs and symptoms of probable OSA  (select all that apply) Snoring   CPAP supplies needed Mask, headgear, cushions, filters, heated tubing and water chamber      12/06/16 1026      Contact information for follow-up providers    Imogene Burn, PA-C On 12/15/2016.   Specialty:  Cardiology Why:  at 1:30 pm Contact information: [place] [id number] [phone]            Contact information for after-discharge care    [facility] .   Specialty:  [place] information: [address] [place] [phone]                 No Known Allergies  Consultations:  Cardiology and Pulmonary Medicine.    Procedures/Studies: Dg Chest 1 View  Result Date: 12/02/2016 CLINICAL DATA:  Wheezing EXAM: CHEST 1 VIEW COMPARISON:  11/28/2016 FINDINGS: Lungs are clear.  No pleural effusion or pneumothorax. Cardiomegaly. IMPRESSION: No evidence of acute cardiopulmonary disease. Electronically Signed   By: Julian Hy M.D.   On: 12/02/2016 10:52   Dg Chest 1 View  Result Date: 11/28/2016 CLINICAL DATA:  Fall, left shoulder pain EXAM: CHEST 1 VIEW COMPARISON:  07/19/2015 FINDINGS: Cardiomegaly with vascular congestion and bilateral interstitial opacities compatible with  interstitial edema. No concha     xxx      head no effusions or acute bony  abnormality. IMPRESSION: Cardiomegaly with vascular congestion and worsening interstitial prominence, likely interstitial edema. Electronically Signed   By: Rolm Baptise M.D.   On: 11/28/2016 12:57   Dg Shoulder Left  Result Date: 11/28/2016 CLINICAL DATA:  Fall, left shoulder pain EXAM: LEFT SHOULDER - 2+ VIEW COMPARISON:  None. FINDINGS: Degenerative changes in the left [facility] and glenohumeral joints. Spurring at the rotator cuff insertion. No acute bony abnormality. Specifically, no fracture, subluxation, or dislocation. Soft tissues are intact. IMPRESSION: No acute bony abnormality.  Mild degenerative changes. Electronically Signed   By: Rolm Baptise M.D.   On: 11/28/2016 12:56       Subjective:   Discharge Exam: Vitals:   12/06/16 0048 12/06/16 0503  BP:  (!) 155/65  Pulse: 98 87  Resp: 16 20  Temp:  98.5 F (36.9 C)   Vitals:   12/05/16 0514 12/05/16 2030 12/06/16 0048 12/06/16 0503  BP: (!) 174/80 (!) 176/83  (!) 155/65  Pulse: 76 91 98 87  Resp: 20 20 16 20   Temp: 97.9 F (36.6 C) 98.3 F (36.8 C)  98.5 F (36.9 C)  TempSrc: Oral Oral  Oral  SpO2: 95% 94% 97% 94%  Weight: (!) 136.9 kg (301 lb 11.2 oz)   133.7 kg (294 lb 12.1 oz)  Height:        General: Pt is alert, awake, not in acute distress Cardiovascular: RRR, S1/S2 +, no rubs, no gallops Respiratory: CTA bilaterally, no wheezing, no rhonchi Abdominal: Soft, NT, ND, bowel sounds + Extremities: no edema, no cyanosis    The results of significant diagnostics from this hospitalization (including imaging, microbiology, ancillary and laboratory) are listed below for reference.     Microbiology: Recent Results (from the past 240 hour(s))  Culture, Urine     Status: Abnormal   Collection Time: 12/02/16  1:46 PM  Result Value Ref Range Status   Specimen Description URINE, CLEAN CATCH  Final   Special Requests NONE  Final   Culture 20,000  COLONIES/mL ESCHERICHIA COLI (A)  Final   Report Status 12/05/2016 FINAL  Final   Organism ID, Bacteria ESCHERICHIA COLI (A)  Final      Susceptibility   Escherichia coli - MIC*    AMPICILLIN >=32 RESISTANT Resistant     CEFAZOLIN <=4 SENSITIVE Sensitive     CEFTRIAXONE <=1 SENSITIVE Sensitive     CIPROFLOXACIN <=0.25 SENSITIVE Sensitive     GENTAMICIN <=1 SENSITIVE Sensitive     IMIPENEM <=0.25 SENSITIVE Sensitive     NITROFURANTOIN <=16 SENSITIVE Sensitive     TRIMETH/SULFA >=320 RESISTANT Resistant     AMPICILLIN/SULBACTAM 16 INTERMEDIATE Intermediate     PIP/TAZO <=4 SENSITIVE Sensitive     Extended ESBL NEGATIVE Sensitive     * 20,000 COLONIES/mL ESCHERICHIA COLI     Labs: BNP (last 3 results)  Recent Labs  05/12/16 0847 11/28/16 1225  BNP 838.8* 6,734.1*   Basic Metabolic Panel:  Recent Labs Lab 11/30/16 0537 12/01/16 0626 12/02/16 0607 12/02/16 1500 12/03/16 0712  NA 139 138 138 135 138  K 4.5 4.7 5.3* 5.0 4.7  CL 106 104 104 102 104  CO2 26 26 28 25 28   GLUCOSE 75 115* 90 146* 66  BUN 33* 37* 41* 43* 45*  CREATININE 1.74* 2.16* 2.20* 2.18* 2.10*  CALCIUM 7.6* 7.6* 7.6* 7.5* 7.5*  MG  --  1.9 1.9  --   --     Recent Labs Lab 12/02/16 0607 12/03/16 0712  WBC 6.5 10.3  HGB 10.1* 9.9*  HCT 33.2* 31.4*  MCV 89.7 88.0  PLT 276 242    Recent Labs Lab 12/05/16 0716 12/05/16 1128 12/05/16 1614 12/05/16 2038 12/06/16 0732  GLUCAP 108* 191* 156* 124* 121*       Component Value Date/Time   COLORURINE YELLOW 12/02/2016 0918   APPEARANCEUR CLOUDY (A) 12/02/2016 0918   LABSPEC 1.008 12/02/2016 0918   PHURINE 5.0 12/02/2016 0918   GLUCOSEU NEGATIVE 12/02/2016 0918   HGBUR MODERATE (A) 12/02/2016 0918   BILIRUBINUR NEGATIVE 12/02/2016 0918   KETONESUR NEGATIVE 12/02/2016 0918   PROTEINUR 100 (A) 12/02/2016 0918   UROBILINOGEN 0.2 01/13/2014 0232   NITRITE NEGATIVE 12/02/2016 0918   LEUKOCYTESUR MODERATE (A) 12/02/2016 0918   Sepsis  Labs Invalid input(s): PROCALCITONIN,  WBC,  LACTICIDVEN Microbiology Recent Results (from the past 240 hour(s))  Culture, Urine     Status: Abnormal   Collection Time: 12/02/16  1:46 PM  Result Value Ref Range Status   Specimen Description URINE, CLEAN CATCH  Final   Special Requests NONE  Final   Culture 20,000 COLONIES/mL ESCHERICHIA COLI (A)  Final   Report Status 12/05/2016 FINAL  Final   Organism ID, Bacteria ESCHERICHIA COLI (A)  Final      Susceptibility   Escherichia coli - MIC*    AMPICILLIN >=32 RESISTANT Resistant     CEFAZOLIN <=4 SENSITIVE Sensitive     CEFTRIAXONE <=1 SENSITIVE Sensitive     CIPROFLOXACIN <=0.25 SENSITIVE Sensitive     GENTAMICIN <=1 SENSITIVE Sensitive     IMIPENEM <=0.25 SENSITIVE Sensitive     NITROFURANTOIN <=16 SENSITIVE Sensitive     TRIMETH/SULFA >=320 RESISTANT Resistant     AMPICILLIN/SULBACTAM 16 INTERMEDIATE Intermediate     PIP/TAZO <=4 SENSITIVE Sensitive     Extended ESBL NEGATIVE Sensitive     * 20,000 COLONIES/mL ESCHERICHIA COLI     Time coordinating discharge: Over 30 minutes SIGNED:  Orvan Falconer, MD FACP [facility] 12/06/2016, 10:27 AM   If 7PM-7AM, please contact night-coverage [email or web address] Password [email or web address]

## 2016-12-06 NOTE — Progress Notes (Signed)
Pt discharged to [facility] per Dr. Marin Comment. Pt's IV site D/C'd and WDL. Pt's VSS. Report called to Leonidas Romberg, nurse at [facility]. Verbalized understanding. Pt left floor via stretcher accompanied by EMS personnel in stable condition.

## 2016-12-06 NOTE — Progress Notes (Signed)
The patient has been maintained for the past few nights on bipap settings of 12/4 with 2lpm O2 bled in. She has ben tolerating machine fairly well per report from night shift RT. No issues noted.

## 2016-12-06 NOTE — Telephone Encounter (Signed)
-----  Message from Desma Paganini sent at 12/06/2016  9:16 AM EDT ----- Dr. Harl Bowie wants this pt to have a B Met and Magnesium the day before her apt, do you put those orders in? She's an inpt right now  Thank you Jarrett Soho

## 2016-12-06 NOTE — Telephone Encounter (Signed)
Orders put it . Will mail pt lab slips.

## 2016-12-06 NOTE — Progress Notes (Signed)
Progress Note  Patient Name: Mary [name] Date of Encounter: 12/06/2016  Primary Cardiologist: Harl Bowie  Subjective   SOB improving Inpatient Medications    Scheduled Meds: . cilostazol  100 mg Oral BID  . ferrous sulfate  325 mg Oral Q breakfast  . furosemide  40 mg Intravenous Q12H  . heparin subcutaneous  5,000 Units Subcutaneous Q8H  . hydrALAZINE  10 mg Oral Q8H  . insulin aspart  0-5 Units Subcutaneous QHS  . insulin aspart  0-9 Units Subcutaneous TID WC  . insulin aspart protamine- aspart  8 Units Subcutaneous BID WC  . metoprolol succinate  50 mg Oral BID  . nystatin   Topical TID  . pravastatin  40 mg Oral Daily  . sodium chloride flush  3 mL Intravenous Q12H   Continuous Infusions: . sodium chloride    . cefTRIAXone (ROCEPHIN)  IV Stopped (12/05/16 1513)   PRN Meds: sodium chloride, acetaminophen, carbidopa-levodopa, ipratropium-albuterol, ondansetron (ZOFRAN) IV, sodium chloride flush   Vital Signs    Vitals:   12/05/16 0514 12/05/16 2030 12/06/16 0048 12/06/16 0503  BP: (!) 174/80 (!) 176/83  (!) 155/65  Pulse: 76 91 98 87  Resp: 20 20 16 20   Temp: 97.9 F (36.6 C) 98.3 F (36.8 C)  98.5 F (36.9 C)  TempSrc: Oral Oral  Oral  SpO2: 95% 94% 97% 94%  Weight: (!) 301 lb 11.2 oz (136.9 kg)   294 lb 12.1 oz (133.7 kg)  Height:        Intake/Output Summary (Last 24 hours) at 12/06/16 0843 Last data filed at 12/06/16 0800  Gross per 24 hour  Intake              770 ml  Output             6425 ml  Net            -5655 ml   Filed Weights   12/04/16 0505 12/05/16 0514 12/06/16 0503  Weight: (!) 317 lb 14.5 oz (144.2 kg) (!) 301 lb 11.2 oz (136.9 kg) 294 lb 12.1 oz (133.7 kg)    Telemetry    n/a  ECG       GEN: No acute distress.   Neck: mildly elevated jvd Cardiac: RRR, no murmurs, rubs, or gallops.  Respiratory: Clear to auscultation bilaterally. GI: Soft, nontender, non-distended  MS: No edema; No deformity. Neuro:  Nonfocal    Psych: Normal affect   Labs    Chemistry Recent Labs Lab 12/02/16 0607 12/02/16 1500 12/03/16 0712  NA 138 135 138  K 5.3* 5.0 4.7  CL 104 102 104  CO2 28 25 28   GLUCOSE 90 146* 66  BUN 41* 43* 45*  CREATININE 2.20* 2.18* 2.10*  CALCIUM 7.6* 7.5* 7.5*  GFRNONAA 21* 22* 23*  GFRAA 25* 25* 26*  ANIONGAP 6 8 6      Hematology Recent Labs Lab 12/02/16 0607 12/03/16 0712  WBC 6.5 10.3  RBC 3.70* 3.57*  HGB 10.1* 9.9*  HCT 33.2* 31.4*  MCV 89.7 88.0  MCH 27.3 27.7  MCHC 30.4 31.5  RDW 16.2* 16.2*  PLT 276 242    Cardiac EnzymesNo results for input(s): TROPONINI in the last 168 hours. No results for input(s): TROPIPOC in the last 168 hours.   BNPNo results for input(s): BNP, PROBNP in the last 168 hours.   DDimer No results for input(s): DDIMER in the last 168 hours.   Radiology    No results found.  Cardiac Studies    Patient Profile     71 y.o. female with a hx of chronic diastolic CHF, hypertension, hyperlipidemia,PSVT,Morbid obesity is acute respiratory failure, frequent falls, OSA, but does not use CPAP(states it was never ordered for her), and chronic dyspneawho is being seen today for the evaluation of diastolic CHF  Assessment & Plan    1. Acute combined systolic/diastolic HF - echo 07/5595 LVEF 30-35%, grade II diastolic dysfunction, multiple WMAs, mild MR, mod TR, PASP 47, moderate pericardial effusion no tamponade. - drop in LVEF from 04/2016 which was 55-60%.  - negative 17 liters this admission, uptrend in Cr and BUN over this admission now trending down. She is currently on lasix 40mg  bid. We will change to oral lasix 40mg  bid.  - medical therapy with Toprol XL 50mg  bid, hydrlazine 10mg  tid. We will increast hydral to 25mg  tid and start imdur 15mg  daily. Poor renal function, no ACE/ARB/aldactone/ARNI. - will need considertion for outpatient ischemic testing. Pending renal function over outpatient f/u consider invasive vs noninvasive.   2.  OSA - history of noncompliance, have encouraged increased use of CPAP. Could be playing some role in her CHF.   3. Pericardial effusion - chronic, continue to monitor    [facility] for discharge today, we will arrange f/u next week with PA along with a BMET/Mg  Signed, Carlyle Dolly, MD  12/06/2016, 8:43 AM

## 2016-12-06 NOTE — Care Management Important Message (Signed)
Important Message  Patient Details  Name: Mary [name] MRN: [id number] Date of Birth: 1945-10-04   Medicare Important Message Given:  Yes    Oscar Forman, Chauncey [place], RN 12/06/2016, 9:25 AM

## 2016-12-15 ENCOUNTER — Other Ambulatory Visit (HOSPITAL_COMMUNITY)
Admission: RE | Admit: 2016-12-15 | Discharge: 2016-12-15 | Disposition: A | Discharge status: Home / Self Care | Payer: PPO | Source: Ambulatory Visit | Attending: Physician Assistant | Admitting: Physician Assistant

## 2016-12-15 ENCOUNTER — Ambulatory Visit (INDEPENDENT_AMBULATORY_CARE_PROVIDER_SITE_OTHER): Payer: PPO | Admitting: Physician Assistant

## 2016-12-15 ENCOUNTER — Encounter: Payer: Self-pay | Admitting: Physician Assistant

## 2016-12-15 VITALS — BP 140/66 | HR 85 | Ht 70.0 in | Wt 257.8 lb

## 2016-12-15 DIAGNOSIS — I313 Pericardial effusion (noninflammatory): Secondary | ICD-10-CM | POA: Diagnosis not present

## 2016-12-15 DIAGNOSIS — I5033 Acute on chronic diastolic (congestive) heart failure: Secondary | ICD-10-CM | POA: Insufficient documentation

## 2016-12-15 DIAGNOSIS — N183 Chronic kidney disease, stage 3 unspecified: Secondary | ICD-10-CM

## 2016-12-15 DIAGNOSIS — I10 Essential (primary) hypertension: Secondary | ICD-10-CM | POA: Diagnosis not present

## 2016-12-15 DIAGNOSIS — R0989 Other specified symptoms and signs involving the circulatory and respiratory systems: Secondary | ICD-10-CM | POA: Diagnosis not present

## 2016-12-15 DIAGNOSIS — I3139 Other pericardial effusion (noninflammatory): Secondary | ICD-10-CM | POA: Insufficient documentation

## 2016-12-15 DIAGNOSIS — I429 Cardiomyopathy, unspecified: Secondary | ICD-10-CM

## 2016-12-15 DIAGNOSIS — I5042 Chronic combined systolic (congestive) and diastolic (congestive) heart failure: Secondary | ICD-10-CM | POA: Diagnosis not present

## 2016-12-15 LAB — BASIC METABOLIC PANEL
ANION GAP: 8 (ref 5–15)
BUN: 47 mg/dL — ABNORMAL HIGH (ref 6–20)
CHLORIDE: 103 mmol/L (ref 101–111)
CO2: 30 mmol/L (ref 22–32)
CREATININE: 2.31 mg/dL — AB (ref 0.44–1.00)
Calcium: 7.5 mg/dL — ABNORMAL LOW (ref 8.9–10.3)
GFR calc non Af Amer: 20 mL/min — ABNORMAL LOW (ref >60)
GFR, EST AFRICAN AMERICAN: 23 mL/min — AB (ref >60)
Glucose, Bld: 124 mg/dL — ABNORMAL HIGH (ref 65–99)
POTASSIUM: 4.1 mmol/L (ref 3.5–5.1)
SODIUM: 141 mmol/L (ref 135–145)

## 2016-12-15 LAB — MAGNESIUM: MAGNESIUM: 1.7 mg/dL (ref 1.7–2.4)

## 2016-12-15 NOTE — Progress Notes (Signed)
Cardiology Office Note    Date:  12/15/2016   ID:  Mary [name], DOB Jul 09, 1945, MRN [id number]  PCP:  Celene Squibb, MD  Cardiologist: Dr. Harl Bowie   Chief Complaint  Patient presents with  . Follow-up    History of Present Illness:  Mary [name] is a 71 y.o. female with history of chronic diastolic CHF, hypertension, HLD, PSVT, morbid obesity, OSA.  Patient is here for post hospital follow-up after an admission with acute combined systolic and diastolic CHF. Echo 11/2016 LVEF 30-35% with grade 2 DD, multiple wall motion abnormalities and mild MR. There was moderate pericardial effusion but no tamponade. This was a drop in her LVEF from 2017 which was 55-60%. She diuresed 17 L was sent home on Lasix 40 mg twice a day. She has poor renal function so no Ace/ARB/Aldactone/AR and I. Consider outpatient ischemic testing in the future pending renal function. She was encouraged to use CPAP as OSA could be playing a role in CHF. Admission weight 300 pounds discharge weight 270 pounds.  Patient is currently at [facility] doing rehabilitation but is brought in today by her husband. Overall she is getting stronger and feeling better. Her weight is down to 257 pounds today. Her breathing has been stable. She has chronic left lower extremity edema and a prosthesis on the right. She is not weighed daily at the [facility]. She denies any chest pain, palpitations, dizziness or presyncope.    Past Medical History:  Diagnosis Date  . Anemia   . Arthritis    knees  . Cellulitis and abscess of foot 02/12/2012  . CHF (congestive heart failure) ([place])   . Complication of anesthesia    pt states after her hysterectomy the doctor said she was "wild" when she woke up   . Diabetes mellitus   . Diabetes mellitus with neuropathy 07/25/2011  . Dysrhythmia    tachy- takes Metoprolol  . OSA (obstructive sleep apnea) 07/25/2011   not using CPAP  . Osteomyelitis of right foot ([place]) 02/14/2012  .  Partial Achilles tendon tear 02/14/2012  . Restless leg syndrome 07/25/2011  . Wound, open    right foot    Past Surgical History:  Procedure Laterality Date  . ABDOMINAL HYSTERECTOMY    . ACHILLES TENDON REPAIR    . AMPUTATION Right 11/25/2012   Procedure: AMPUTATION BELOW KNEE;  Surgeon: Newt Minion, MD;  Location: [facility];  Service: Orthopedics;  Laterality: Right;  Right Below Knee Amputation  . BELOW KNEE LEG AMPUTATION Right 11/25/2012   Dr Sharol Given  . BREAST SURGERY Left    Lumpectomy non ca  . CATARACT EXTRACTION W/PHACO Right 10/09/2013   Procedure: CATARACT EXTRACTION PHACO AND INTRAOCULAR LENS PLACEMENT (IOC);  Surgeon: Elta Guadeloupe T. Gershon Crane, MD;  Location: [facility];  Service: Ophthalmology;  Laterality: Right;  CDE:9.05  . CATARACT EXTRACTION W/PHACO Left 10/23/2013   Procedure: ATTEMPTED CATARACT EXTRACTION PHACO AND INTRAOCULAR LENS PLACEMENT;  Surgeon: Elta Guadeloupe T. Gershon Crane, MD;  Location: [facility];  Service: Ophthalmology;  Laterality: Left;  CDE:  4.41  . TOE AMPUTATION     partial rt great toe    Current Medications: Current Meds  Medication Sig  . acetaminophen (TYLENOL) 325 MG tablet Take 2 tablets (650 mg total) by mouth every 4 (four) hours as needed for headache or mild pain.  . carbidopa-levodopa (SINEMET) 25-100 MG tablet Take 1 tablet by mouth at bedtime as needed (For restless leg.).  Marland Kitchen cholecalciferol (VITAMIN D) 1000 UNITS  tablet Take 1,000 Units by mouth daily.   . cilostazol (PLETAL) 100 MG tablet Take 100 mg by mouth 2 (two) times daily.   . compounded topicals builder Apply 1 application topically. KET/DIC/BAC/LID/M/GUA  . furosemide (LASIX) 40 MG tablet Take 1 tablet (40 mg total) by mouth 2 (two) times daily.  Marland Kitchen gabapentin (NEURONTIN) 800 MG tablet Take 800 mg by mouth 3 (three) times daily.   . hydrALAZINE (APRESOLINE) 25 MG tablet Take 1 tablet (25 mg total) by mouth every 8 (eight) hours.  . insulin lispro protamine-lispro (HUMALOG 50/50 MIX) (50-50) 100 UNIT/ML SUSP  injection Inject 0.2 mLs (20 Units total) into the skin 3 (three) times daily. (Patient taking differently: Inject 20-25 Units into the skin 3 (three) times daily. Patient self adjusts amount based on sugar levels)  . isosorbide mononitrate (IMDUR) 30 MG 24 hr tablet Take 0.5 tablets (15 mg total) by mouth daily.  Marland Kitchen lidocaine-prilocaine (EMLA) cream Apply 1 application topically as needed.   . metoprolol succinate (TOPROL-XL) 50 MG 24 hr tablet Take 1 tablet (50 mg total) by mouth 2 (two) times daily. Take with or immediately following a meal.  . pravastatin (PRAVACHOL) 40 MG tablet Take 40 mg by mouth daily.  . Tolnaftate (ABSORBINE JR EX) Apply 1 application topically daily as needed (for pain).     Allergies:   Patient has no known allergies.   Social History   Social History  . Marital status: Married    Spouse name: N/A  . Number of children: N/A  . Years of education: N/A   Social History Main Topics  . Smoking status: Never Smoker  . Smokeless tobacco: Never Used  . Alcohol use No  . Drug use: No  . Sexual activity: No   Other Topics Concern  . None   Social History Narrative  . None     Family History:  The patient's family history includes Diabetes in her sister.   ROS:   Please see the history of present illness.    Review of Systems  Constitution: Negative.  HENT: Negative.   Eyes: Negative.   Cardiovascular: Positive for leg swelling.  Respiratory: Negative.   Hematologic/Lymphatic: Negative.   Musculoskeletal: Negative.  Negative for joint pain.  Gastrointestinal: Negative.   Genitourinary: Negative.   Neurological: Negative.    All other systems reviewed and are negative.   PHYSICAL EXAM:   VS:  BP 140/66   Pulse 85   Ht 5\' 10"  (1.778 m)   Wt 257 lb 12.8 oz (116.9 kg)   LMP 07/25/2011   SpO2 95%   BMI 36.99 kg/m   Physical Exam  GEN: Well nourished, well developed, in no acute distress  Neck:Bilateral carotid bruits versus murmur portrayed,   no JVD,r masses GYJEHUD:JSH;7-0/2 systolic murmur at the left sternal border  Respiratory:  clear to auscultation bilaterally, normal work of breathing GI: soft, nontender, nondistended, + BS Ext: 1-2+ edema left lower extremity Neuro:  Alert and Oriented x 3, Strength and sensation are intact Psych: euthymic mood, full affect  Wt Readings from Last 3 Encounters:  12/15/16 257 lb 12.8 oz (116.9 kg)  12/06/16 294 lb 12.1 oz (133.7 kg)  07/05/16 236 lb (107 kg)      Studies/Labs Reviewed:   EKG:  EKG is not ordered today.     Recent Labs: 11/28/2016: ALT 16; B Natriuretic Peptide 1,485.0 12/02/2016: Magnesium 1.9 12/03/2016: BUN 45; Creatinine, Ser 2.10; Hemoglobin 9.9; Platelets 242; Potassium 4.7; Sodium 138  Lipid Panel    Component Value Date/Time   CHOL 149 01/14/2014 0548   TRIG 148 01/14/2014 0548   HDL 50 01/14/2014 0548   CHOLHDL 3.0 01/14/2014 0548   VLDL 30 01/14/2014 0548   LDLCALC 69 01/14/2014 0548    Additional studies/ records that were reviewed today include:   2-D echo 7/5/18Study Conclusions   - Left ventricle: The cavity size was normal. There was moderate   concentric hypertrophy. Systolic function was moderately to   severely reduced. The estimated ejection fraction was in the   range of 30% to 35%. Diffuse hypokinesis. Features are consistent   with a pseudonormal left ventricular filling pattern, with   concomitant abnormal relaxation and increased filling pressure   (grade 2 diastolic dysfunction). Doppler parameters are   consistent with high ventricular filling pressure. - Regional wall motion abnormality: Moderate hypokinesis of the mid   anterior, mid anteroseptal, mid anterolateral, and apical lateral   myocardium; mild hypokinesis of the mid inferoseptal myocardium. - Mitral valve: Mildly thickened leaflets . There was mild   regurgitation. - Left atrium: The atrium was mildly dilated. - Right ventricle: Systolic function was reduced. -  Tricuspid valve: Mildly thickened leaflets. There was moderate   regurgitation. - Pulmonary arteries: PA peak pressure: 47 mm Hg (S). - Inferior vena cava: The vessel was dilated. The respirophasic   diameter changes were blunted (< 50%), consistent with elevated   central venous pressure. - Pericardium, extracardiac: Moderate size pericardial effusion,   larger more posteriorly. Features were not consistent with   tamponade physiology.      ASSESSMENT:    1. Chronic combined systolic and diastolic heart failure ([place])   2. Pericardial effusion   3. Cardiomyopathy, unspecified type ([place])   4. Essential hypertension   5. CKD (chronic kidney disease), stage III   6. Bilateral carotid bruits      PLAN:  In order of problems listed above:  Chronic combined systolic and diastolic CHF weight down to 257 pounds today. Discharge weight was 270 pounds. She still has left lower extremity edema which seems to be chronic. Heart failure seems well compensated. Will check renal function but not change her medicines today.  Pericardial effusion in the hospital. Will order follow-up echo  Cardiomyopathy with small motion abnormalities on 2-D echo. Consider ischemic workup in the future pending renal function and recovery from rehabilitation. Follow-up with Dr. branch and 1-2 months. Essential hypertension controlled today  CKD stage III check kidney function today  Bilateral carotid bruits had carotid disease back in 2015 but hasn't had it checked since we'll order carotid Dopplers.     Medication Adjustments/Labs and Tests Ordered: Current medicines are reviewed at length with the patient today.  Concerns regarding medicines are outlined above.  Medication changes, Labs and Tests ordered today are listed in the Patient Instructions below. Patient Instructions  Your physician recommends that you schedule a follow-up appointment in: 6-8 Weeks with Dr. Harl Bowie   Your physician recommends  that you continue on your current medications as directed. Please refer to the Current Medication list given to you today.  Your physician recommends that you return for lab work in: Today  Your physician has requested that you have an echocardiogram. Echocardiography is a painless test that uses sound waves to create images of your heart. It provides your doctor with information about the size and shape of your heart and how well your heart's chambers and valves are working. This procedure takes approximately one  hour. There are no restrictions for this procedure.    Your physician recommends that you weigh, daily, at the same time every day, and in the same amount of clothing. Please record your daily weights on the handout provided and bring it to your next appointment.   If you need a refill on your cardiac medications before your next appointment, please call your pharmacy.  Thank you for choosing [facility]!       Signed, Ermalinda Barrios, PA-C  12/15/2016 1:53 PM    [facility] [address], [place], [place]  [address] Phone: [phone]; Fax: [phone]

## 2016-12-15 NOTE — Patient Instructions (Addendum)
Your physician recommends that you schedule a follow-up appointment in: 6-8 Weeks with Dr. Harl Bowie   Your physician recommends that you continue on your current medications as directed. Please refer to the Current Medication list given to you today.  Your physician recommends that you return for lab work in: Today  Your physician has requested that you have an echocardiogram. Echocardiography is a painless test that uses sound waves to create images of your heart. It provides your doctor with information about the size and shape of your heart and how well your heart's chambers and valves are working. This procedure takes approximately one hour. There are no restrictions for this procedure.  Your physician has requested that you have a carotid duplex. This test is an ultrasound of the carotid arteries in your neck. It looks at blood flow through these arteries that supply the brain with blood. Allow one hour for this exam. There are no restrictions or special instructions.  Your physician recommends that you weigh, daily, at the same time every day, and in the same amount of clothing. Please record your daily weights on the handout provided and bring it to your next appointment.   If you need a refill on your cardiac medications before your next appointment, please call your pharmacy.  Thank you for choosing [facility]!

## 2016-12-20 ENCOUNTER — Telehealth: Payer: Self-pay

## 2016-12-20 MED ORDER — FUROSEMIDE 40 MG PO TABS: 40.0000 mg | ORAL_TABLET | Freq: Every day | ORAL | 3 refills | Status: DC

## 2016-12-20 NOTE — Telephone Encounter (Signed)
[facility] [place] [phone] Katie RN notified

## 2016-12-20 NOTE — Telephone Encounter (Signed)
-----   Message from Imogene Burn, PA-C sent at 12/20/2016  7:57 AM EDT ----- Kidney function up a little. Try to decrease lasix to 40 mg once daily. Call if edema worsens or if weight increases by 2-3 lbs overnight. Patient at [facility] center. Please make sure they are weighing her daily

## 2016-12-21 ENCOUNTER — Ambulatory Visit (HOSPITAL_COMMUNITY): Admission: RE | Admit: 2016-12-21 | Payer: PPO | Source: Ambulatory Visit

## 2016-12-21 ENCOUNTER — Ambulatory Visit (HOSPITAL_COMMUNITY)
Admission: RE | Admit: 2016-12-21 | Discharge: 2016-12-21 | Disposition: A | Discharge status: Home / Self Care | Payer: PPO | Source: Ambulatory Visit | Attending: Physician Assistant | Admitting: Physician Assistant

## 2016-12-21 DIAGNOSIS — I313 Pericardial effusion (noninflammatory): Secondary | ICD-10-CM | POA: Diagnosis not present

## 2016-12-21 DIAGNOSIS — I5030 Unspecified diastolic (congestive) heart failure: Secondary | ICD-10-CM | POA: Insufficient documentation

## 2016-12-21 DIAGNOSIS — I420 Dilated cardiomyopathy: Secondary | ICD-10-CM | POA: Insufficient documentation

## 2016-12-21 DIAGNOSIS — I3139 Other pericardial effusion (noninflammatory): Secondary | ICD-10-CM

## 2016-12-21 DIAGNOSIS — I081 Rheumatic disorders of both mitral and tricuspid valves: Secondary | ICD-10-CM | POA: Insufficient documentation

## 2016-12-21 LAB — ECHOCARDIOGRAM COMPLETE
AVLVOTPG: 6 mmHg
CHL CUP DOP CALC LVOT VTI: 27.5 cm
CHL CUP MV DEC (S): 331
CHL CUP STROKE VOLUME: 69 mL
CHL CUP TV REG PEAK VELOCITY: 301 cm/s
E/e' ratio: 25.83
EWDT: 331 ms
FS: 34 % (ref 28–44)
IVS/LV PW RATIO, ED: 0.9
LA ID, A-P, ES: 51 mm
LA diam end sys: 51 mm
LA diam index: 2.08 cm/m2
LA vol index: 39.1 mL/m2
LA vol: 95.8 mL
LAVOLA4C: 97 mL
LDCA: 3.14 cm2
LV E/e' medial: 25.83
LV E/e'average: 25.83
LV PW d: 14.2 mm — AB (ref 0.6–1.1)
LV TDI E'LATERAL: 5.11
LV dias vol index: 46 mL/m2
LV e' LATERAL: 5.11 cm/s
LV sys vol: 44 mL — AB
LVDIAVOL: 113 mL — AB (ref 46–106)
LVOT SV: 86 mL
LVOTD: 20 mm
LVOTPV: 121 cm/s
LVSYSVOLIN: 18 mL/m2
MV Peak grad: 7 mmHg
MV pk E vel: 132 m/s
MVPKAVEL: 119 m/s
RV LATERAL S' VELOCITY: 15.2 cm/s
RV sys press: 44 mmHg
Simpson's disk: 61
TAPSE: 25.7 mm
TDI e' medial: 4.57
TRMAXVEL: 301 cm/s

## 2016-12-21 NOTE — Progress Notes (Signed)
*  PRELIMINARY RESULTS* Echocardiogram 2D Echocardiogram has been performed.  Mary [name] 12/21/2016, 2:12 PM

## 2016-12-23 ENCOUNTER — Telehealth: Payer: Self-pay | Admitting: *Deleted

## 2016-12-23 NOTE — Telephone Encounter (Signed)
Called patient with test results. No answer. Left message to call back.  

## 2016-12-23 NOTE — Telephone Encounter (Signed)
-----   Message from Imogene Burn, PA-C sent at 12/23/2016  7:56 AM EDT ----- Heart function has normalized!!! No wall motion abnormalities and only small pericardial effusion. Keep f/u with Dr. Harl Bowie

## 2017-02-04 ENCOUNTER — Ambulatory Visit: Payer: PPO | Admitting: Cardiology

## 2017-02-07 ENCOUNTER — Encounter: Payer: Self-pay | Admitting: Cardiology

## 2017-02-07 ENCOUNTER — Other Ambulatory Visit (HOSPITAL_COMMUNITY)
Admission: RE | Admit: 2017-02-07 | Discharge: 2017-02-07 | Disposition: A | Discharge status: Home / Self Care | Payer: PPO | Source: Ambulatory Visit | Attending: Cardiology | Admitting: Cardiology

## 2017-02-07 ENCOUNTER — Ambulatory Visit (INDEPENDENT_AMBULATORY_CARE_PROVIDER_SITE_OTHER): Payer: PPO | Admitting: Cardiology

## 2017-02-07 VITALS — BP 142/78 | HR 94 | Ht 70.0 in | Wt 219.0 lb

## 2017-02-07 DIAGNOSIS — I10 Essential (primary) hypertension: Secondary | ICD-10-CM

## 2017-02-07 DIAGNOSIS — Z79899 Other long term (current) drug therapy: Secondary | ICD-10-CM

## 2017-02-07 DIAGNOSIS — N184 Chronic kidney disease, stage 4 (severe): Secondary | ICD-10-CM | POA: Diagnosis not present

## 2017-02-07 DIAGNOSIS — I5032 Chronic diastolic (congestive) heart failure: Secondary | ICD-10-CM | POA: Diagnosis not present

## 2017-02-07 DIAGNOSIS — G4730 Sleep apnea, unspecified: Secondary | ICD-10-CM

## 2017-02-07 DIAGNOSIS — I313 Pericardial effusion (noninflammatory): Secondary | ICD-10-CM | POA: Diagnosis not present

## 2017-02-07 DIAGNOSIS — I3139 Other pericardial effusion (noninflammatory): Secondary | ICD-10-CM

## 2017-02-07 LAB — BASIC METABOLIC PANEL
Anion gap: 9 (ref 5–15)
BUN: 37 mg/dL — AB (ref 6–20)
CHLORIDE: 101 mmol/L (ref 101–111)
CO2: 27 mmol/L (ref 22–32)
CREATININE: 1.79 mg/dL — AB (ref 0.44–1.00)
Calcium: 8.1 mg/dL — ABNORMAL LOW (ref 8.9–10.3)
GFR calc Af Amer: 32 mL/min — ABNORMAL LOW (ref >60)
GFR calc non Af Amer: 27 mL/min — ABNORMAL LOW (ref >60)
GLUCOSE: 247 mg/dL — AB (ref 65–99)
POTASSIUM: 3.9 mmol/L (ref 3.5–5.1)
Sodium: 137 mmol/L (ref 135–145)

## 2017-02-07 LAB — MAGNESIUM: Magnesium: 1.6 mg/dL — ABNORMAL LOW (ref 1.7–2.4)

## 2017-02-07 MED ORDER — METOPROLOL SUCCINATE ER 50 MG PO TB24: 50.0000 mg | ORAL_TABLET | Freq: Two times a day (BID) | ORAL | 3 refills | Status: DC

## 2017-02-07 NOTE — Progress Notes (Signed)
Clinical Summary [name] is a 71 y.o.female seen today for follow up of the following medical problems.   1.PSVT - no recent symptoms - compliant with beta blocker  2. Chronic systolic/diastolic heart failure -admit 11/2016 with severe CHF, diuresed over 17 liters. Echo at that time showed a drop in LVEF to 30-35%, however on more recent repeat her LVEF has now normalized.   - weight down to 219 lbs. Last month she was 257 lbs. Back during admission in 11/2016 she was around 300 lbs.  - no recent edema. No troubles with breathing - she cut back on lasix from bid to once daily on her own. Weights and swelling remain stable.  - limiting sodium intake.  3. HTN - not currently on ACE or ARB, I do not have this history. She states it was stopped at some time. She thinks b/c of high potassium. - she remains compliant with her bp meds   4. Hyperlipidemia - she is compliant with her statin.    5. Pericardial effusion - chronic, noted to be small by last echo 11/2016  6. OSA - has not been using home CPAP  7. CKD IV - renal function has been improving with diuresis over the last 2 months Past Medical History:  Diagnosis Date  . Anemia   . Arthritis    knees  . Cellulitis and abscess of foot 02/12/2012  . CHF (congestive heart failure) ([place])   . Complication of anesthesia    pt states after her hysterectomy the doctor said she was "wild" when she woke up   . Diabetes mellitus   . Diabetes mellitus with neuropathy 07/25/2011  . Dysrhythmia    tachy- takes Metoprolol  . OSA (obstructive sleep apnea) 07/25/2011   not using CPAP  . Osteomyelitis of right foot ([place]) 02/14/2012  . Partial Achilles tendon tear 02/14/2012  . Restless leg syndrome 07/25/2011  . Wound, open    right foot     No Known Allergies   Current Outpatient Prescriptions  Medication Sig Dispense Refill  . acetaminophen (TYLENOL) 325 MG tablet Take 2 tablets (650 mg total) by mouth every 4  (four) hours as needed for headache or mild pain. 100 tablet 0  . carbidopa-levodopa (SINEMET) 25-100 MG tablet Take 1 tablet by mouth at bedtime as needed (For restless leg.). 60 tablet 2  . cholecalciferol (VITAMIN D) 1000 UNITS tablet Take 1,000 Units by mouth daily.     . cilostazol (PLETAL) 100 MG tablet Take 100 mg by mouth 2 (two) times daily.     . compounded topicals builder Apply 1 application topically. KET/DIC/BAC/LID/M/GUA    . furosemide (LASIX) 40 MG tablet Take 1 tablet (40 mg total) by mouth daily. 90 tablet 3  . gabapentin (NEURONTIN) 800 MG tablet Take 800 mg by mouth 3 (three) times daily.     . hydrALAZINE (APRESOLINE) 25 MG tablet Take 1 tablet (25 mg total) by mouth every 8 (eight) hours. 90 tablet 1  . insulin lispro protamine-lispro (HUMALOG 50/50 MIX) (50-50) 100 UNIT/ML SUSP injection Inject 0.2 mLs (20 Units total) into the skin 3 (three) times daily. (Patient taking differently: Inject 20-25 Units into the skin 3 (three) times daily. Patient self adjusts amount based on sugar levels) 10 mL 11  . isosorbide mononitrate (IMDUR) 30 MG 24 hr tablet Take 0.5 tablets (15 mg total) by mouth daily. 30 tablet 2  . lidocaine-prilocaine (EMLA) cream Apply 1 application topically as needed.     Marland Kitchen  metoprolol succinate (TOPROL-XL) 50 MG 24 hr tablet Take 1 tablet (50 mg total) by mouth 2 (two) times daily. Take with or immediately following a meal. 30 tablet 1  . pravastatin (PRAVACHOL) 40 MG tablet Take 40 mg by mouth daily.    . Tolnaftate (ABSORBINE JR EX) Apply 1 application topically daily as needed (for pain).     No current facility-administered medications for this visit.      Past Surgical History:  Procedure Laterality Date  . ABDOMINAL HYSTERECTOMY    . ACHILLES TENDON REPAIR    . AMPUTATION Right 11/25/2012   Procedure: AMPUTATION BELOW KNEE;  Surgeon: Newt Minion, MD;  Location: [facility];  Service: Orthopedics;  Laterality: Right;  Right Below Knee Amputation  .  BELOW KNEE LEG AMPUTATION Right 11/25/2012   Dr Sharol Given  . BREAST SURGERY Left    Lumpectomy non ca  . CATARACT EXTRACTION W/PHACO Right 10/09/2013   Procedure: CATARACT EXTRACTION PHACO AND INTRAOCULAR LENS PLACEMENT (IOC);  Surgeon: Elta Guadeloupe T. Gershon Crane, MD;  Location: [facility];  Service: Ophthalmology;  Laterality: Right;  CDE:9.05  . CATARACT EXTRACTION W/PHACO Left 10/23/2013   Procedure: ATTEMPTED CATARACT EXTRACTION PHACO AND INTRAOCULAR LENS PLACEMENT;  Surgeon: Elta Guadeloupe T. Gershon Crane, MD;  Location: [facility];  Service: Ophthalmology;  Laterality: Left;  CDE:  4.41  . TOE AMPUTATION     partial rt great toe     No Known Allergies    Family History  Problem Relation Age of Onset  . Diabetes Sister      Social History [name] reports that she has never smoked. She has never used smokeless tobacco. [name] reports that she does not drink alcohol.   Review of Systems CONSTITUTIONAL: No weight loss, fever, chills, weakness or fatigue.  HEENT: Eyes: No visual loss, blurred vision, double vision or yellow sclerae.No hearing loss, sneezing, congestion, runny nose or sore throat.  SKIN: No rash or itching.  CARDIOVASCULAR:per hpi  RESPIRATORY: No shortness of breath, cough or sputum.  GASTROINTESTINAL: No anorexia, nausea, vomiting or diarrhea. No abdominal pain or blood.  GENITOURINARY: No burning on urination, no polyuria NEUROLOGICAL: No headache, dizziness, syncope, paralysis, ataxia, numbness or tingling in the extremities. No change in bowel or bladder control.  MUSCULOSKELETAL: No muscle, back pain, joint pain or stiffness.  LYMPHATICS: No enlarged nodes. No history of splenectomy.  PSYCHIATRIC: No history of depression or anxiety.  ENDOCRINOLOGIC: No reports of sweating, cold or heat intolerance. No polyuria or polydipsia.  Marland Kitchen   Physical Examination Vitals:   02/07/17 0922  BP: (!) 142/78  Pulse: 94  SpO2: 100%   Vitals:   02/07/17 0922  Weight: 219 lb (99.3 kg)  Height:  5\' 10"  (1.778 m)    Gen: resting comfortably, no acute distress HEENT: no scleral icterus, pupils equal round and reactive, no palptable cervical adenopathy,  CV: RRR, no m/r,g no jvd Resp: Clear to auscultation bilaterally GI: abdomen is soft, non-tender, non-distended, normal bowel sounds, no hepatosplenomegaly MSK: extremities are warm, trace bilateral LE edema Skin: warm, no rash Neuro:  no focal deficits Psych: appropriate affect   Diagnostic Studies 02/2013 Echo LVEF 55-60%, mild-mod LVH, grade I diastolic dysfunction.    02/2013 Cartoid US IMPRESSION: Less than 50% stenosis in the right and left internal carotid arteries. There is plaque bilaterally as described above.   07/2015 echo Study Conclusions  - Left ventricle: The cavity size was normal. Wall thickness was increased in a pattern of moderate LVH. Systolic function was  normal. The estimated ejection fraction was in the range of 60% to 65%. Wall motion was normal; there were no regional wall motion abnormalities. Features are consistent with a pseudonormal left ventricular filling pattern, with concomitant abnormal relaxation and increased filling pressure (grade 2 diastolic dysfunction). Doppler parameters are consistent with high ventricular filling pressure. - Aortic valve: Mildly calcified annulus. Trileaflet; mildly thickened leaflets. Valve area (VTI): 2.51 cm^2. Valve area (Vmax): 3.04 cm^2. - Left atrium: The atrium was mildly dilated. - Atrial septum: No defect or patent foramen ovale was identified. - Tricuspid valve: There was mild-moderate regurgitation. - Pulmonary arteries: Systolic pressure was mildly to moderately increased. PA peak pressure: 41 mm Hg (S). - Technically adequate study  12/02/16 echo Study Conclusions  - Left ventricle: The cavity size was normal. There was moderate   concentric hypertrophy. Systolic function was moderately to   severely  reduced. The estimated ejection fraction was in the   range of 30% to 35%. Diffuse hypokinesis. Features are consistent   with a pseudonormal left ventricular filling pattern, with   concomitant abnormal relaxation and increased filling pressure   (grade 2 diastolic dysfunction). Doppler parameters are   consistent with high ventricular filling pressure. - Regional wall motion abnormality: Moderate hypokinesis of the mid   anterior, mid anteroseptal, mid anterolateral, and apical lateral   myocardium; mild hypokinesis of the mid inferoseptal myocardium. - Mitral valve: Mildly thickened leaflets . There was mild   regurgitation. - Left atrium: The atrium was mildly dilated. - Right ventricle: Systolic function was reduced. - Tricuspid valve: Mildly thickened leaflets. There was moderate   regurgitation. - Pulmonary arteries: PA peak pressure: 47 mm Hg (S). - Inferior vena cava: The vessel was dilated. The respirophasic   diameter changes were blunted (< 50%), consistent with elevated   central venous pressure. - Pericardium, extracardiac: Moderate size pericardial effusion,   larger more posteriorly. Features were not consistent with   tamponade physiology.  12/21/2016 echo Study Conclusions  - Left ventricle: The cavity size was normal. Wall thickness was   increased in a pattern of moderate LVH. Systolic function was   normal. The estimated ejection fraction was in the range of 60%   to 65%. Wall motion was normal; there were no regional wall   motion abnormalities. Features are consistent with a pseudonormal   left ventricular filling pattern, with concomitant abnormal   relaxation and increased filling pressure (grade 2 diastolic   dysfunction). Doppler parameters are consistent with high   ventricular filling pressure. - Aortic valve: Valve area (VTI): 2.48 cm^2. Valve area (Vmax):   2.39 cm^2. Valve area (Vmean): 2.31 cm^2. - Mitral valve: There was mild regurgitation. -  Left atrium: The atrium was moderately to severely dilated. - Right atrium: The atrium was mildly dilated. - Atrial septum: No defect or patent foramen ovale was identified. - Tricuspid valve: There was moderate regurgitation. - Pulmonary arteries: Systolic pressure was moderately increased.   PA peak pressure: 44 mm Hg (S). - Systemic veins: IVC is dilated, with normal respiratory   variation. Estimated RA pressure is 8 mmHg. - Pericardium, extracardiac: There is a small circumferential   pericardial effusion. No evidence of tamponade physiology. - Technically adequate study.  Assessment and Plan  1. PSVT - no significant symptoms, we will continue to monitor.   2. Chronic diastolic heart failure - dramatic weight loss over the last 2 months, around 80 lbs - we will repeat labs, may need further downtitration of diuretic depending  on her renal function.Currently taking lasix 20mg  daily.  - call in 2 weeks to update [place] on weights  3. HTN - at goal, continue current meds.   4. Hyperlipidemia -she will continue statin  5. Pericardial effusion - chronic, mild bny last [place]. Continue to monitor.    6. OSA - refer to Dr Luan Pulling.    Arnoldo Lenis, M.D.

## 2017-02-07 NOTE — Patient Instructions (Signed)
Medication Instructions:  Your physician recommends that you continue on your current medications as directed. Please refer to the Current Medication list given to you today.   Labwork: BMET MAGNESIUM TSH   Testing/Procedures: NONE  Follow-Up: Your physician recommends that you schedule a follow-up appointment in: 4 MONTHS    Any Other Special Instructions Will Be Listed Below (If Applicable).  PLEASE UPDATE [place] WITH YOUR WEIGHTS IN 2 WEEKS   If you need a refill on your cardiac medications before your next appointment, please call your pharmacy.

## 2017-02-08 ENCOUNTER — Telehealth: Payer: Self-pay

## 2017-02-08 NOTE — Telephone Encounter (Signed)
-----   Message from Arnoldo Lenis, MD sent at 02/08/2017  9:39 AM EDT ----- Labs look good, kidney function is improving. I would continue her current dose of lasix. Verify she has been taking lasix 20mg  daily.  J BrancH MD

## 2017-02-08 NOTE — Telephone Encounter (Signed)
Pt. Verified that she is taking lasix @ 20 mg daily. Notified of lab results, voiced understanding.

## 2017-02-09 ENCOUNTER — Other Ambulatory Visit (HOSPITAL_COMMUNITY)
Admission: RE | Admit: 2017-02-09 | Discharge: 2017-02-09 | Disposition: A | Discharge status: Home / Self Care | Payer: PPO | Source: Ambulatory Visit | Attending: Cardiology | Admitting: Cardiology

## 2017-02-09 DIAGNOSIS — Z79899 Other long term (current) drug therapy: Secondary | ICD-10-CM | POA: Diagnosis present

## 2017-02-09 LAB — TSH: TSH: 2.717 u[IU]/mL (ref 0.350–4.500)

## 2017-02-22 ENCOUNTER — Telehealth: Payer: Self-pay | Admitting: Cardiology

## 2017-02-22 NOTE — Telephone Encounter (Signed)
Pt given MD's message and is pleased

## 2017-02-22 NOTE — Telephone Encounter (Signed)
Im ok with weight stable around 217. She had lost a very large amout of weight/fluid over the last few weeks and typically folks do tend to level off as there fluid levels become even. Would continue current meds and continue to monitor weights   Zandra Abts MD

## 2017-02-22 NOTE — Telephone Encounter (Signed)
Wt at home is 217 lbs

## 2017-02-22 NOTE — Telephone Encounter (Signed)
Please give pt a call concerning weight tracking, states she weighs the same today as she did 2 weeks ago. [phone]

## 2017-04-01 ENCOUNTER — Encounter (HOSPITAL_COMMUNITY): Payer: Self-pay | Admitting: Emergency Medicine

## 2017-04-01 ENCOUNTER — Emergency Department (HOSPITAL_COMMUNITY)
Admission: EM | Admit: 2017-04-01 | Discharge: 2017-04-01 | Disposition: A | Discharge status: Home / Self Care | Payer: PPO | Attending: Emergency Medicine | Admitting: Emergency Medicine

## 2017-04-01 DIAGNOSIS — I10 Essential (primary) hypertension: Secondary | ICD-10-CM | POA: Diagnosis not present

## 2017-04-01 DIAGNOSIS — Z5321 Procedure and treatment not carried out due to patient leaving prior to being seen by health care provider: Secondary | ICD-10-CM | POA: Insufficient documentation

## 2017-04-01 LAB — CBG MONITORING, ED: GLUCOSE-CAPILLARY: 127 mg/dL — AB (ref 65–99)

## 2017-04-01 NOTE — ED Triage Notes (Signed)
Patient sent by Dr Juel Burrow office for hypertension. Per patient blood pressure was 200/119 today. Patient reports waking with headache yesterday and reports still has headache. Patient reports nausea with clear vomiting. Patient reports elevated blood sugar in 200s. Patient blood pressure 140/58 and patient reports improvement in headache since checking in. Patient also states "I haven't vomited in a while, so I think I'm going home now." .

## 2017-04-01 NOTE — ED Notes (Signed)
Pt reports her BP is lower as is her BS so she is going home now  She is encouraged to return for any problems

## 2017-05-20 ENCOUNTER — Other Ambulatory Visit: Payer: Self-pay | Admitting: Adult Health

## 2017-06-09 ENCOUNTER — Ambulatory Visit: Payer: Medicare Other | Admitting: Cardiology

## 2017-06-09 ENCOUNTER — Encounter: Payer: Self-pay | Admitting: Cardiology

## 2017-06-09 VITALS — BP 158/78 | HR 83 | Ht 70.0 in | Wt 234.0 lb

## 2017-06-09 DIAGNOSIS — I10 Essential (primary) hypertension: Secondary | ICD-10-CM

## 2017-06-09 DIAGNOSIS — E782 Mixed hyperlipidemia: Secondary | ICD-10-CM

## 2017-06-09 DIAGNOSIS — I5032 Chronic diastolic (congestive) heart failure: Secondary | ICD-10-CM | POA: Diagnosis not present

## 2017-06-09 DIAGNOSIS — I471 Supraventricular tachycardia: Secondary | ICD-10-CM

## 2017-06-09 MED ORDER — HYDRALAZINE HCL 25 MG PO TABS: 37.5000 mg | ORAL_TABLET | Freq: Three times a day (TID) | ORAL | 6 refills | Status: DC

## 2017-06-09 NOTE — Progress Notes (Signed)
Clinical Summary Mary [name] is a 72 y.o.female  seen today for follow up of the following medical problems.   1.PSVT - compliant with beta blocker - no recent symptoms  2. Chronic systolic/diastolic heart failure -admit 11/2016 with severe CHF, diuresed over 17 liters. Echo at that time showed a drop in LVEF to 30-35%, however on more recent repeat her LVEF has now normalized.    - recent weight gain, no edema. Reports poor dietary habits.  - no recent SOB/DOE   3. HTN - not currently on ACE or ARB, I do not have this history. She states it was stopped at some time. She thinks b/c of high potassium. - compliant with meds   4. Hyperlipidemia - compliant with meds   5. Pericardial effusion - chronic, noted to be small by last echo 11/2016  6. OSA - has not been using home CPAP  7. CKD IV - most recently Cr had been trending down  Past Medical History:  Diagnosis Date  . Anemia   . Arthritis    knees  . Cellulitis and abscess of foot 02/12/2012  . CHF (congestive heart failure) ([place])   . Complication of anesthesia    pt states after her hysterectomy the doctor said she was "wild" when she woke up   . Diabetes mellitus   . Diabetes mellitus with neuropathy 07/25/2011  . Dysrhythmia    tachy- takes Metoprolol  . OSA (obstructive sleep apnea) 07/25/2011   not using CPAP  . Osteomyelitis of right foot ([place]) 02/14/2012  . Partial Achilles tendon tear 02/14/2012  . Restless leg syndrome 07/25/2011  . Wound, open    right foot     No Known Allergies   Current Outpatient Medications  Medication Sig Dispense Refill  . acetaminophen (TYLENOL) 325 MG tablet Take 2 tablets (650 mg total) by mouth every 4 (four) hours as needed for headache or mild pain. 100 tablet 0  . carbidopa-levodopa (SINEMET) 25-100 MG tablet Take 1 tablet by mouth at bedtime as needed (For restless leg.). 60 tablet 2  . cholecalciferol (VITAMIN D) 1000 UNITS tablet Take 1,000 Units by  mouth daily.     . cilostazol (PLETAL) 100 MG tablet Take 100 mg by mouth 2 (two) times daily.     . compounded topicals builder Apply 1 application topically. KET/DIC/BAC/LID/M/GUA    . furosemide (LASIX) 20 MG tablet Take 20 mg by mouth.    . gabapentin (NEURONTIN) 800 MG tablet Take 800 mg by mouth 3 (three) times daily.     . hydrALAZINE (APRESOLINE) 25 MG tablet Take 1 tablet (25 mg total) by mouth every 8 (eight) hours. 90 tablet 1  . insulin lispro protamine-lispro (HUMALOG 50/50 MIX) (50-50) 100 UNIT/ML SUSP injection Inject 0.2 mLs (20 Units total) into the skin 3 (three) times daily. (Patient taking differently: Inject 20-25 Units into the skin 3 (three) times daily. Patient self adjusts amount based on sugar levels) 10 mL 11  . isosorbide mononitrate (IMDUR) 30 MG 24 hr tablet Take 0.5 tablets (15 mg total) by mouth daily. 30 tablet 2  . lidocaine-prilocaine (EMLA) cream Apply 1 application topically as needed.     . metoprolol succinate (TOPROL-XL) 50 MG 24 hr tablet Take 1 tablet (50 mg total) by mouth 2 (two) times daily. Take with or immediately following a meal. 180 tablet 3  . pravastatin (PRAVACHOL) 40 MG tablet Take 40 mg by mouth daily.    . Tolnaftate (ABSORBINE JR EX)  Apply 1 application topically daily as needed (for pain).     No current facility-administered medications for this visit.      Past Surgical History:  Procedure Laterality Date  . ABDOMINAL HYSTERECTOMY    . ACHILLES TENDON REPAIR    . AMPUTATION Right 11/25/2012   Procedure: AMPUTATION BELOW KNEE;  Surgeon: Newt Minion, MD;  Location: [facility];  Service: Orthopedics;  Laterality: Right;  Right Below Knee Amputation  . BELOW KNEE LEG AMPUTATION Right 11/25/2012   Dr Sharol Given  . BREAST SURGERY Left    Lumpectomy non ca  . CATARACT EXTRACTION W/PHACO Right 10/09/2013   Procedure: CATARACT EXTRACTION PHACO AND INTRAOCULAR LENS PLACEMENT (IOC);  Surgeon: Elta Guadeloupe T. Gershon Crane, MD;  Location: [facility];  Service:  Ophthalmology;  Laterality: Right;  CDE:9.05  . CATARACT EXTRACTION W/PHACO Left 10/23/2013   Procedure: ATTEMPTED CATARACT EXTRACTION PHACO AND INTRAOCULAR LENS PLACEMENT;  Surgeon: Elta Guadeloupe T. Gershon Crane, MD;  Location: [facility];  Service: Ophthalmology;  Laterality: Left;  CDE:  4.41  . TOE AMPUTATION     partial rt great toe     No Known Allergies    Family History  Problem Relation Age of Onset  . Diabetes Sister      Social History Ms. Gardiner reports that  has never smoked. she has never used smokeless tobacco. Ms. Math reports that she does not drink alcohol.   Review of Systems CONSTITUTIONAL: No weight loss, fever, chills, weakness or fatigue.  HEENT: Eyes: No visual loss, blurred vision, double vision or yellow sclerae.No hearing loss, sneezing, congestion, runny nose or sore throat.  SKIN: No rash or itching.  CARDIOVASCULAR: per hpi RESPIRATORY: No shortness of breath, cough or sputum.  GASTROINTESTINAL: No anorexia, nausea, vomiting or diarrhea. No abdominal pain or blood.  GENITOURINARY: No burning on urination, no polyuria NEUROLOGICAL: No headache, dizziness, syncope, paralysis, ataxia, numbness or tingling in the extremities. No change in bowel or bladder control.  MUSCULOSKELETAL: No muscle, back pain, joint pain or stiffness.  LYMPHATICS: No enlarged nodes. No history of splenectomy.  PSYCHIATRIC: No history of depression or anxiety.  ENDOCRINOLOGIC: No reports of sweating, cold or heat intolerance. No polyuria or polydipsia.  Marland Kitchen   Physical Examination Vitals:   06/09/17 1052  BP: (!) 158/78  Pulse: 83  SpO2: 97%   Vitals:   06/09/17 1052  Weight: 234 lb (106.1 kg)  Height: 5\' 10"  (1.778 m)    Gen: resting comfortably, no acute distress HEENT: no scleral icterus, pupils equal round and reactive, no palptable cervical adenopathy,  CV: RRR, no m/r/g, no jvd Resp: Clear to auscultation bilaterally GI: abdomen is soft, non-tender, non-distended, normal  bowel sounds, no hepatosplenomegaly MSK: extremities are warm, no edema.  Skin: warm, no rash Neuro:  no focal deficits Psych: appropriate affect   Diagnostic Studies 02/2013 Echo LVEF 55-60%, mild-mod LVH, grade I diastolic dysfunction.    02/2013 Cartoid US IMPRESSION: Less than 50% stenosis in the right and left internal carotid arteries. There is plaque bilaterally as described above.   07/2015 echo Study Conclusions  - Left ventricle: The cavity size was normal. Wall thickness was increased in a pattern of moderate LVH. Systolic function was normal. The estimated ejection fraction was in the range of 60% to 65%. Wall motion was normal; there were no regional wall motion abnormalities. Features are consistent with a pseudonormal left ventricular filling pattern, with concomitant abnormal relaxation and increased filling pressure (grade 2 diastolic dysfunction). Doppler parameters are consistent with high  ventricular filling pressure. - Aortic valve: Mildly calcified annulus. Trileaflet; mildly thickened leaflets. Valve area (VTI): 2.51 cm^2. Valve area (Vmax): 3.04 cm^2. - Left atrium: The atrium was mildly dilated. - Atrial septum: No defect or patent foramen ovale was identified. - Tricuspid valve: There was mild-moderate regurgitation. - Pulmonary arteries: Systolic pressure was mildly to moderately increased. PA peak pressure: 41 mm Hg (S). - Technically adequate study  12/02/16 echo Study Conclusions  - Left ventricle: The cavity size was normal. There was moderate concentric hypertrophy. Systolic function was moderately to severely reduced. The estimated ejection fraction was in the range of 30% to 35%. Diffuse hypokinesis. Features are consistent with a pseudonormal left ventricular filling pattern, with concomitant abnormal relaxation and increased filling pressure (grade 2 diastolic dysfunction). Doppler parameters  are consistent with high ventricular filling pressure. - Regional wall motion abnormality: Moderate hypokinesis of the mid anterior, mid anteroseptal, mid anterolateral, and apical lateral myocardium; mild hypokinesis of the mid inferoseptal myocardium. - Mitral valve: Mildly thickened leaflets . There was mild regurgitation. - Left atrium: The atrium was mildly dilated. - Right ventricle: Systolic function was reduced. - Tricuspid valve: Mildly thickened leaflets. There was moderate regurgitation. - Pulmonary arteries: PA peak pressure: 47 mm Hg (S). - Inferior vena cava: The vessel was dilated. The respirophasic diameter changes were blunted (<50%), consistent with elevated central venous pressure. - Pericardium, extracardiac: Moderate size pericardial effusion, larger more posteriorly. Features were not consistent with tamponade physiology.  12/21/2016 echo Study Conclusions  - Left ventricle: The cavity size was normal. Wall thickness was increased in a pattern of moderate LVH. Systolic function was normal. The estimated ejection fraction was in the range of 60% to 65%. Wall motion was normal; there were no regional wall motion abnormalities. Features are consistent with a pseudonormal left ventricular filling pattern, with concomitant abnormal relaxation and increased filling pressure (grade 2 diastolic dysfunction). Doppler parameters are consistent with high ventricular filling pressure. - Aortic valve: Valve area (VTI): 2.48 cm^2. Valve area (Vmax): 2.39 cm^2. Valve area (Vmean): 2.31 cm^2. - Mitral valve: There was mild regurgitation. - Left atrium: The atrium was moderately to severely dilated. - Right atrium: The atrium was mildly dilated. - Atrial septum: No defect or patent foramen ovale was identified. - Tricuspid valve: There was moderate regurgitation. - Pulmonary arteries: Systolic pressure was moderately increased. PA  peak pressure: 44 mm Hg (S). - Systemic veins: IVC is dilated, with normal respiratory variation. Estimated RA pressure is 8 mmHg. - Pericardium, extracardiac: There is a small circumferential pericardial effusion. No evidence of tamponade physiology. - Technically adequate study.    Assessment and Plan    1. PSVT - asymptomatic, contiue to monitor.    2. Chronic diastolic heart failure - no recent symptoms. Recent weight gain but appears euvolemic by exam - continue current meds  3. HTN - elevated in clinic, increase hydral to 37.5mg  tid  4. Hyperlipidemia -continue staitn, request pcp labs   F/u 6 months  Arnoldo Lenis, M.D..

## 2017-06-09 NOTE — Patient Instructions (Signed)
Medication Instructions:  INCREASE HYDRALAZINE TO 37.5 MG, THREE TIMES DAILY  Labwork: I will request labs from PCP.  Testing/Procedures: NONE  Follow-Up: Your physician wants you to follow-up in: 6 MONTHS .  You will receive a reminder letter in the mail two months in advance. If you don't receive a letter, please call our office to schedule the follow-up appointment.   Any Other Special Instructions Will Be Listed Below (If Applicable).     If you need a refill on your cardiac medications before your next appointment, please call your pharmacy.

## 2017-06-13 ENCOUNTER — Encounter: Payer: Self-pay | Admitting: Cardiology

## 2017-06-15 DIAGNOSIS — D509 Iron deficiency anemia, unspecified: Secondary | ICD-10-CM | POA: Diagnosis not present

## 2017-06-15 DIAGNOSIS — E1165 Type 2 diabetes mellitus with hyperglycemia: Secondary | ICD-10-CM | POA: Diagnosis not present

## 2017-06-17 ENCOUNTER — Other Ambulatory Visit (HOSPITAL_COMMUNITY): Payer: Self-pay | Admitting: Nephrology

## 2017-06-17 DIAGNOSIS — N183 Chronic kidney disease, stage 3 unspecified: Secondary | ICD-10-CM

## 2017-06-29 ENCOUNTER — Encounter (HOSPITAL_COMMUNITY): Payer: Self-pay

## 2017-06-29 ENCOUNTER — Ambulatory Visit (HOSPITAL_COMMUNITY)
Admission: RE | Admit: 2017-06-29 | Discharge: 2017-06-29 | Disposition: A | Discharge status: Home / Self Care | Payer: Medicare Other | Source: Ambulatory Visit | Attending: Nephrology | Admitting: Nephrology

## 2017-07-06 ENCOUNTER — Ambulatory Visit: Payer: PPO | Admitting: Neurology

## 2017-08-02 DIAGNOSIS — Z89511 Acquired absence of right leg below knee: Secondary | ICD-10-CM | POA: Diagnosis not present

## 2017-08-02 DIAGNOSIS — M25562 Pain in left knee: Secondary | ICD-10-CM | POA: Diagnosis not present

## 2017-08-02 DIAGNOSIS — R296 Repeated falls: Secondary | ICD-10-CM | POA: Diagnosis not present

## 2017-08-04 ENCOUNTER — Ambulatory Visit (HOSPITAL_COMMUNITY): Admission: RE | Admit: 2017-08-04 | Payer: Medicare Other | Source: Ambulatory Visit

## 2017-08-08 DIAGNOSIS — Z89511 Acquired absence of right leg below knee: Secondary | ICD-10-CM | POA: Diagnosis not present

## 2017-08-20 ENCOUNTER — Emergency Department (HOSPITAL_COMMUNITY): Payer: Medicare Other

## 2017-08-20 ENCOUNTER — Other Ambulatory Visit: Payer: Self-pay

## 2017-08-20 ENCOUNTER — Observation Stay (HOSPITAL_COMMUNITY)
Admission: EM | Admit: 2017-08-20 | Discharge: 2017-08-22 | Disposition: A | Discharge status: Home / Self Care | Payer: Medicare Other | Attending: Internal Medicine | Admitting: Internal Medicine

## 2017-08-20 ENCOUNTER — Encounter (HOSPITAL_COMMUNITY): Payer: Self-pay | Admitting: Emergency Medicine

## 2017-08-20 DIAGNOSIS — W0110XA Fall on same level from slipping, tripping and stumbling with subsequent striking against unspecified object, initial encounter: Secondary | ICD-10-CM | POA: Diagnosis not present

## 2017-08-20 DIAGNOSIS — Y92009 Unspecified place in unspecified non-institutional (private) residence as the place of occurrence of the external cause: Secondary | ICD-10-CM | POA: Diagnosis not present

## 2017-08-20 DIAGNOSIS — L8990 Pressure ulcer of unspecified site, unspecified stage: Secondary | ICD-10-CM

## 2017-08-20 DIAGNOSIS — G4733 Obstructive sleep apnea (adult) (pediatric): Secondary | ICD-10-CM | POA: Diagnosis not present

## 2017-08-20 DIAGNOSIS — Y999 Unspecified external cause status: Secondary | ICD-10-CM | POA: Diagnosis not present

## 2017-08-20 DIAGNOSIS — I5032 Chronic diastolic (congestive) heart failure: Secondary | ICD-10-CM | POA: Diagnosis present

## 2017-08-20 DIAGNOSIS — R54 Age-related physical debility: Principal | ICD-10-CM | POA: Insufficient documentation

## 2017-08-20 DIAGNOSIS — I130 Hypertensive heart and chronic kidney disease with heart failure and stage 1 through stage 4 chronic kidney disease, or unspecified chronic kidney disease: Secondary | ICD-10-CM | POA: Diagnosis not present

## 2017-08-20 DIAGNOSIS — R7989 Other specified abnormal findings of blood chemistry: Secondary | ICD-10-CM | POA: Diagnosis present

## 2017-08-20 DIAGNOSIS — S0990XA Unspecified injury of head, initial encounter: Secondary | ICD-10-CM | POA: Diagnosis not present

## 2017-08-20 DIAGNOSIS — Z794 Long term (current) use of insulin: Secondary | ICD-10-CM | POA: Diagnosis not present

## 2017-08-20 DIAGNOSIS — E1122 Type 2 diabetes mellitus with diabetic chronic kidney disease: Secondary | ICD-10-CM | POA: Insufficient documentation

## 2017-08-20 DIAGNOSIS — Y929 Unspecified place or not applicable: Secondary | ICD-10-CM | POA: Insufficient documentation

## 2017-08-20 DIAGNOSIS — N183 Chronic kidney disease, stage 3 (moderate): Secondary | ICD-10-CM | POA: Insufficient documentation

## 2017-08-20 DIAGNOSIS — S199XXA Unspecified injury of neck, initial encounter: Secondary | ICD-10-CM | POA: Diagnosis not present

## 2017-08-20 DIAGNOSIS — F419 Anxiety disorder, unspecified: Secondary | ICD-10-CM | POA: Diagnosis present

## 2017-08-20 DIAGNOSIS — Z7902 Long term (current) use of antithrombotics/antiplatelets: Secondary | ICD-10-CM | POA: Insufficient documentation

## 2017-08-20 DIAGNOSIS — E0800 Diabetes mellitus due to underlying condition with hyperosmolarity without nonketotic hyperglycemic-hyperosmolar coma (NKHHC): Secondary | ICD-10-CM

## 2017-08-20 DIAGNOSIS — R531 Weakness: Secondary | ICD-10-CM | POA: Diagnosis not present

## 2017-08-20 DIAGNOSIS — Y9301 Activity, walking, marching and hiking: Secondary | ICD-10-CM | POA: Insufficient documentation

## 2017-08-20 DIAGNOSIS — R748 Abnormal levels of other serum enzymes: Secondary | ICD-10-CM

## 2017-08-20 DIAGNOSIS — W19XXXA Unspecified fall, initial encounter: Secondary | ICD-10-CM | POA: Diagnosis not present

## 2017-08-20 DIAGNOSIS — R51 Headache: Secondary | ICD-10-CM | POA: Diagnosis present

## 2017-08-20 DIAGNOSIS — R296 Repeated falls: Secondary | ICD-10-CM

## 2017-08-20 DIAGNOSIS — D649 Anemia, unspecified: Secondary | ICD-10-CM | POA: Diagnosis present

## 2017-08-20 DIAGNOSIS — E119 Type 2 diabetes mellitus without complications: Secondary | ICD-10-CM

## 2017-08-20 DIAGNOSIS — R778 Other specified abnormalities of plasma proteins: Secondary | ICD-10-CM | POA: Diagnosis present

## 2017-08-20 DIAGNOSIS — N184 Chronic kidney disease, stage 4 (severe): Secondary | ICD-10-CM | POA: Diagnosis not present

## 2017-08-20 DIAGNOSIS — Z79899 Other long term (current) drug therapy: Secondary | ICD-10-CM | POA: Insufficient documentation

## 2017-08-20 DIAGNOSIS — I10 Essential (primary) hypertension: Secondary | ICD-10-CM | POA: Diagnosis not present

## 2017-08-20 DIAGNOSIS — R404 Transient alteration of awareness: Secondary | ICD-10-CM | POA: Diagnosis not present

## 2017-08-20 DIAGNOSIS — I5043 Acute on chronic combined systolic (congestive) and diastolic (congestive) heart failure: Secondary | ICD-10-CM | POA: Diagnosis not present

## 2017-08-20 DIAGNOSIS — R5381 Other malaise: Secondary | ICD-10-CM | POA: Diagnosis present

## 2017-08-20 LAB — COMPREHENSIVE METABOLIC PANEL
ALT: 19 U/L (ref 14–54)
ANION GAP: 10 (ref 5–15)
AST: 19 U/L (ref 15–41)
Albumin: 3.2 g/dL — ABNORMAL LOW (ref 3.5–5.0)
Alkaline Phosphatase: 96 U/L (ref 38–126)
BUN: 37 mg/dL — ABNORMAL HIGH (ref 6–20)
CHLORIDE: 105 mmol/L (ref 101–111)
CO2: 26 mmol/L (ref 22–32)
CREATININE: 2.31 mg/dL — AB (ref 0.44–1.00)
Calcium: 8.4 mg/dL — ABNORMAL LOW (ref 8.9–10.3)
GFR calc non Af Amer: 20 mL/min — ABNORMAL LOW (ref >60)
GFR, EST AFRICAN AMERICAN: 23 mL/min — AB (ref >60)
Glucose, Bld: 215 mg/dL — ABNORMAL HIGH (ref 65–99)
Potassium: 4.2 mmol/L (ref 3.5–5.1)
SODIUM: 141 mmol/L (ref 135–145)
Total Bilirubin: 0.5 mg/dL (ref 0.3–1.2)
Total Protein: 7 g/dL (ref 6.5–8.1)

## 2017-08-20 LAB — CBC WITH DIFFERENTIAL/PLATELET
BASOS PCT: 1 %
Basophils Absolute: 0 10*3/uL (ref 0.0–0.1)
EOS ABS: 0.2 10*3/uL (ref 0.0–0.7)
Eosinophils Relative: 4 %
HCT: 31.2 % — ABNORMAL LOW (ref 36.0–46.0)
HEMOGLOBIN: 10 g/dL — AB (ref 12.0–15.0)
Lymphocytes Relative: 13 %
Lymphs Abs: 0.8 10*3/uL (ref 0.7–4.0)
MCH: 28.1 pg (ref 26.0–34.0)
MCHC: 32.1 g/dL (ref 30.0–36.0)
MCV: 87.6 fL (ref 78.0–100.0)
MONO ABS: 0.6 10*3/uL (ref 0.1–1.0)
MONOS PCT: 10 %
NEUTROS ABS: 4.6 10*3/uL (ref 1.7–7.7)
NEUTROS PCT: 72 %
Platelets: 277 10*3/uL (ref 150–400)
RBC: 3.56 MIL/uL — ABNORMAL LOW (ref 3.87–5.11)
RDW: 14.3 % (ref 11.5–15.5)
WBC: 6.3 10*3/uL (ref 4.0–10.5)

## 2017-08-20 LAB — URINALYSIS, ROUTINE W REFLEX MICROSCOPIC
BILIRUBIN URINE: NEGATIVE
GLUCOSE, UA: 150 mg/dL — AB
KETONES UR: NEGATIVE mg/dL
LEUKOCYTES UA: NEGATIVE
NITRITE: NEGATIVE
Specific Gravity, Urine: 1.019 (ref 1.005–1.030)
pH: 5 (ref 5.0–8.0)

## 2017-08-20 LAB — RAPID URINE DRUG SCREEN, HOSP PERFORMED
Amphetamines: NOT DETECTED
Barbiturates: NOT DETECTED
Benzodiazepines: POSITIVE — AB
Cocaine: NOT DETECTED
OPIATES: NOT DETECTED
Tetrahydrocannabinol: NOT DETECTED

## 2017-08-20 LAB — TROPONIN I
TROPONIN I: 0.03 ng/mL — AB (ref <0.03)
Troponin I: 0.03 ng/mL (ref <0.03)

## 2017-08-20 LAB — GLUCOSE, CAPILLARY: Glucose-Capillary: 182 mg/dL — ABNORMAL HIGH (ref 65–99)

## 2017-08-20 LAB — ETHANOL

## 2017-08-20 LAB — LACTIC ACID, PLASMA
LACTIC ACID, VENOUS: 1.2 mmol/L (ref 0.5–1.9)
Lactic Acid, Venous: 1.1 mmol/L (ref 0.5–1.9)

## 2017-08-20 LAB — CK: CK TOTAL: 157 U/L (ref 38–234)

## 2017-08-20 LAB — BRAIN NATRIURETIC PEPTIDE: B NATRIURETIC PEPTIDE 5: 387 pg/mL — AB (ref 0.0–100.0)

## 2017-08-20 MED ORDER — CILOSTAZOL 100 MG PO TABS
100.0000 mg | ORAL_TABLET | Freq: Every day | ORAL | Status: DC
  Administered 2017-08-22: 100 mg via ORAL
  Filled 2017-08-20 (×4): qty 1

## 2017-08-20 MED ORDER — INSULIN ASPART 100 UNIT/ML ~~LOC~~ SOLN
0.0000 [IU] | Freq: Three times a day (TID) | SUBCUTANEOUS | Status: DC
  Administered 2017-08-22: 3 [IU] via SUBCUTANEOUS

## 2017-08-20 MED ORDER — GABAPENTIN 100 MG PO CAPS
ORAL_CAPSULE | ORAL | Status: AC
  Filled 2017-08-20: qty 2

## 2017-08-20 MED ORDER — HYDRALAZINE HCL 25 MG PO TABS
37.5000 mg | ORAL_TABLET | Freq: Three times a day (TID) | ORAL | Status: DC
  Administered 2017-08-22 (×2): 37.5 mg via ORAL
  Filled 2017-08-20 (×3): qty 2

## 2017-08-20 MED ORDER — SODIUM CHLORIDE 0.9 % IV SOLN
INTRAVENOUS | Status: DC
  Administered 2017-08-20: 20:00:00 via INTRAVENOUS

## 2017-08-20 MED ORDER — VITAMIN D 1000 UNITS PO TABS
1000.0000 [IU] | ORAL_TABLET | Freq: Every day | ORAL | Status: DC
  Administered 2017-08-22: 1000 [IU] via ORAL
  Filled 2017-08-20 (×4): qty 1

## 2017-08-20 MED ORDER — ACETAMINOPHEN 325 MG PO TABS: 650.0000 mg | ORAL_TABLET | Freq: Four times a day (QID) | ORAL | Status: DC | PRN

## 2017-08-20 MED ORDER — ROPINIROLE HCL 1 MG PO TABS
2.0000 mg | ORAL_TABLET | Freq: Every day | ORAL | Status: DC
  Administered 2017-08-20: 2 mg via ORAL
  Filled 2017-08-20: qty 2

## 2017-08-20 MED ORDER — ONDANSETRON HCL 4 MG/2ML IJ SOLN: 4.0000 mg | Freq: Four times a day (QID) | INTRAMUSCULAR | Status: DC | PRN

## 2017-08-20 MED ORDER — ACETAMINOPHEN 650 MG RE SUPP: 650.0000 mg | Freq: Four times a day (QID) | RECTAL | Status: DC | PRN

## 2017-08-20 MED ORDER — SODIUM CHLORIDE 0.9 % IV SOLN
INTRAVENOUS | Status: AC
  Administered 2017-08-20 – 2017-08-21 (×2): via INTRAVENOUS

## 2017-08-20 MED ORDER — LORAZEPAM 2 MG/ML IJ SOLN
1.0000 mg | Freq: Once | INTRAMUSCULAR | Status: AC
  Administered 2017-08-20: 1 mg via INTRAVENOUS
  Filled 2017-08-20: qty 1

## 2017-08-20 MED ORDER — INSULIN ASPART 100 UNIT/ML ~~LOC~~ SOLN: 0.0000 [IU] | Freq: Every day | SUBCUTANEOUS | Status: DC

## 2017-08-20 MED ORDER — ONDANSETRON HCL 4 MG PO TABS: 4.0000 mg | ORAL_TABLET | Freq: Four times a day (QID) | ORAL | Status: DC | PRN

## 2017-08-20 MED ORDER — METOPROLOL TARTRATE 50 MG PO TABS
50.0000 mg | ORAL_TABLET | Freq: Two times a day (BID) | ORAL | Status: DC
  Administered 2017-08-20 – 2017-08-22 (×2): 50 mg via ORAL
  Filled 2017-08-20 (×3): qty 1

## 2017-08-20 MED ORDER — HEPARIN SODIUM (PORCINE) 5000 UNIT/ML IJ SOLN
5000.0000 [IU] | Freq: Three times a day (TID) | INTRAMUSCULAR | Status: DC
  Administered 2017-08-20 – 2017-08-22 (×6): 5000 [IU] via SUBCUTANEOUS
  Filled 2017-08-20 (×6): qty 1

## 2017-08-20 MED ORDER — GABAPENTIN 300 MG PO CAPS
ORAL_CAPSULE | ORAL | Status: AC
  Filled 2017-08-20: qty 2

## 2017-08-20 MED ORDER — DIAZEPAM 5 MG PO TABS
5.0000 mg | ORAL_TABLET | Freq: Every day | ORAL | Status: DC
  Administered 2017-08-20: 5 mg via ORAL
  Filled 2017-08-20: qty 1

## 2017-08-20 MED ORDER — SODIUM CHLORIDE 0.9 % IV BOLUS (SEPSIS)
250.0000 mL | Freq: Once | INTRAVENOUS | Status: AC
  Administered 2017-08-20: 250 mL via INTRAVENOUS

## 2017-08-20 MED ORDER — GABAPENTIN 800 MG PO TABS
800.0000 mg | ORAL_TABLET | Freq: Three times a day (TID) | ORAL | Status: DC
  Administered 2017-08-20: 800 mg via ORAL
  Filled 2017-08-20 (×5): qty 1

## 2017-08-20 MED ORDER — INSULIN GLARGINE 100 UNIT/ML ~~LOC~~ SOLN
20.0000 [IU] | Freq: Every day | SUBCUTANEOUS | Status: DC
  Administered 2017-08-20: 20 [IU] via SUBCUTANEOUS
  Filled 2017-08-20 (×3): qty 0.2

## 2017-08-20 MED ORDER — TRAMADOL HCL 50 MG PO TABS: 50.0000 mg | ORAL_TABLET | Freq: Four times a day (QID) | ORAL | Status: DC | PRN

## 2017-08-20 MED ORDER — SENNOSIDES-DOCUSATE SODIUM 8.6-50 MG PO TABS: 1.0000 | ORAL_TABLET | Freq: Every evening | ORAL | Status: DC | PRN

## 2017-08-20 MED ORDER — PRAVASTATIN SODIUM 40 MG PO TABS: 40.0000 mg | ORAL_TABLET | Freq: Every day | ORAL | Status: DC

## 2017-08-20 MED ORDER — DULOXETINE HCL 30 MG PO CPEP
60.0000 mg | ORAL_CAPSULE | Freq: Every day | ORAL | Status: DC
  Administered 2017-08-22: 60 mg via ORAL
  Filled 2017-08-20 (×2): qty 2

## 2017-08-20 NOTE — ED Notes (Signed)
Pt given sandwich bag 

## 2017-08-20 NOTE — ED Notes (Signed)
Pt given cup of water per request.

## 2017-08-20 NOTE — ED Notes (Signed)
Date and time results received: 08/20/17 1654 (use smartphrase ".now" to insert current time)  Test: trop Critical Value: 0.03  Name of Provider Notified: [email or web address]  Orders Received? Or Actions Taken?: none

## 2017-08-20 NOTE — H&P (Signed)
History and Physical    Mary [name] SWN:[id number] DOB: Sep 25, 1945 DOA: 08/20/2017  PCP: Celene Squibb, MD   Patient coming from: Home   Chief Complaint: Multiple falls   HPI: Mary [name] is a 72 y.o. female with medical history significant for hypertension, type 2 diabetes mellitus, chronic diastolic CHF, chronic anemia, and history of osteomyelitis in the right foot status post right BKA, now presenting to the emergency department after multiple falls at home.  Patient reports that she has been generally weak, worsening over the past several months after she stopped physical therapy.  At baseline, she ambulates with a walker around her home, but reports that she has not been using the walker recently and has fallen multiple times.  Patient and her family have raised these concerns with her PCP who has involved social work.  Family reports that they were told it will take several days for nursing home placement, but they do not feel that she is safe at home with her multiple falls.  Patient denies any focal numbness or weakness, change in vision or hearing, chest pain, palpitations, fevers, chills, cough, or shortness of breath.  There has not been any abdominal pain, nausea, or vomiting.  She denies melena or hematochezia.  ED Course: Upon arrival to the ED, patient is found to be afebrile, saturating well on room air, and with vitals otherwise normal.  EKG features a sinus rhythm with nonspecific T wave abnormalities.  Chest x-ray is negative for acute cardiopulmonary disease.  Noncontrast head CT is negative for acute intracranial abnormality.  Cervical spine CT is negative for acute fracture or subluxation.  Chemistry panel is notable for a creatinine of 2.31, up from 1.8 last September.  CBC features a stable chronic normocytic anemia with hemoglobin of 10.0.  Lactic acid is reassuringly normal.  BNP is elevated, but much lower than priors.  Troponin is slightly elevated to 0.03.   Urinalysis features proteinuria.  Patient was treated with 250 cc normal saline bolus in the ED and started on normal saline infusion.  She remains hemodynamically stable, in no apparent respiratory distress, and will be observed on the medical-surgical unit for ongoing evaluation and management of generalized weakness with recurrent falls.  Review of Systems:  All other systems reviewed and apart from HPI, are negative.  Past Medical History:  Diagnosis Date  . Anemia   . Arthritis    knees  . Cellulitis and abscess of foot 02/12/2012  . CHF (congestive heart failure) ([place])   . Complication of anesthesia    pt states after her hysterectomy the doctor said she was "wild" when she woke up   . Diabetes mellitus   . Diabetes mellitus with neuropathy 07/25/2011  . Dysrhythmia    tachy- takes Metoprolol  . OSA (obstructive sleep apnea) 07/25/2011   not using CPAP  . Osteomyelitis of right foot ([place]) 02/14/2012  . Partial Achilles tendon tear 02/14/2012  . Restless leg syndrome 07/25/2011  . Wound, open    right foot    Past Surgical History:  Procedure Laterality Date  . ABDOMINAL HYSTERECTOMY    . ACHILLES TENDON REPAIR    . AMPUTATION Right 11/25/2012   Procedure: AMPUTATION BELOW KNEE;  Surgeon: Newt Minion, MD;  Location: [facility];  Service: Orthopedics;  Laterality: Right;  Right Below Knee Amputation  . BELOW KNEE LEG AMPUTATION Right 11/25/2012   Dr Sharol Given  . BREAST SURGERY Left    Lumpectomy non ca  . CATARACT  EXTRACTION W/PHACO Right 10/09/2013   Procedure: CATARACT EXTRACTION PHACO AND INTRAOCULAR LENS PLACEMENT (IOC);  Surgeon: Elta Guadeloupe T. Gershon Crane, MD;  Location: [facility];  Service: Ophthalmology;  Laterality: Right;  CDE:9.05  . CATARACT EXTRACTION W/PHACO Left 10/23/2013   Procedure: ATTEMPTED CATARACT EXTRACTION PHACO AND INTRAOCULAR LENS PLACEMENT;  Surgeon: Elta Guadeloupe T. Gershon Crane, MD;  Location: [facility];  Service: Ophthalmology;  Laterality: Left;  CDE:  4.41  . TOE AMPUTATION     partial  rt great toe     reports that she has never smoked. She has never used smokeless tobacco. She reports that she does not drink alcohol or use drugs.  No Known Allergies  Family History  Problem Relation Age of Onset  . Diabetes Sister      Prior to Admission medications   Medication Sig Start Date End Date Taking? Authorizing Provider  cholecalciferol (VITAMIN D) 1000 UNITS tablet Take 1,000 Units by mouth daily.    Yes [provider]  compounded topicals builder Apply 1 application topically. KET/DIC/BAC/LID/M/GUA   Yes [provider]  diazepam (VALIUM) 5 MG tablet Take 1 tablet by mouth at bedtime. 06/25/17  Yes [provider]  DULoxetine (CYMBALTA) 60 MG capsule Take 1 capsule by mouth daily.   Yes [provider]  ferrous sulfate 325 (65 FE) MG tablet Take 325 mg by mouth daily.    Yes [provider]  furosemide (LASIX) 20 MG tablet Take 20 mg by mouth.   Yes [provider]  gabapentin (NEURONTIN) 800 MG tablet Take 800 mg by mouth 3 (three) times daily.  04/09/16  Yes [provider]  hydrALAZINE (APRESOLINE) 25 MG tablet Take 1.5 tablets (37.5 mg total) by mouth 3 (three) times daily. 06/09/17  Yes Branch, Alphonse Guild, MD  insulin lispro protamine-lispro (HUMALOG 50/50 MIX) (50-50) 100 UNIT/ML SUSP injection Inject 0.2 mLs (20 Units total) into the skin 3 (three) times daily. Patient taking differently: Inject 20-25 Units into the skin 3 (three) times daily. Patient self adjusts amount based on sugar levels 07/22/15  Yes Kelvin Cellar, MD  metoprolol tartrate (LOPRESSOR) 50 MG tablet Take 50 mg by mouth 2 (two) times daily.    Yes [provider]  ONE TOUCH ULTRA TEST test strip  06/15/17  Yes [provider]  potassium chloride (K-DUR,KLOR-CON) 10 MEQ tablet Take 10 mEq by mouth daily.  06/09/17  Yes [provider]  pravastatin (PRAVACHOL) 40 MG tablet Take 40 mg by mouth daily.   Yes  [provider]  rOPINIRole (REQUIP) 2 MG tablet Take 2 mg by mouth at bedtime.  06/09/17  Yes [provider]  temazepam (RESTORIL) 15 MG capsule Take 15 mg by mouth daily.  07/29/17  Yes [provider]  Tolnaftate (ABSORBINE JR EX) Apply 1 application topically daily as needed (for pain).   Yes [provider]  traMADol (ULTRAM) 50 MG tablet Take 100 mg by mouth as needed.    Yes [provider]  Vitamins/Minerals TABS Take by mouth.   Yes [provider]  acetaminophen (TYLENOL) 325 MG tablet Take 2 tablets (650 mg total) by mouth every 4 (four) hours as needed for headache or mild pain. 12/06/16   Orvan Falconer, MD  cilostazol (PLETAL) 100 MG tablet Take 100 mg by mouth daily.     [provider]  HUMALOG MIX 50/50 KWIKPEN (50-50) 100 UNIT/ML Claiborne Rigg  06/15/17   [provider]  Insulin Lispro Prot & Lispro (HUMALOG 50/50 MIX) (50-50)  100 UNIT/ML Kwikpen Inject into the skin.    [provider]  isosorbide mononitrate (IMDUR) 30 MG 24 hr tablet Take 0.5 tablets (15 mg total) by mouth daily. Patient not taking: Reported on 08/20/2017 12/06/16   Orvan Falconer, MD    Physical Exam: Vitals:   08/20/17 1522 08/20/17 1730 08/20/17 1932  BP: (!) 171/78 102/83 (!) 179/96  Pulse: 90 97 94  Resp: 18 20 18   Temp: 97.8 F (36.6 C)    TempSrc: Oral    SpO2: 100% 98% 99%  Weight: 106.1 kg (234 lb)    Height: 5\' 4"  (1.626 m)        Constitutional: NAD, calm, frail appearing Eyes: PERTLA, lids and conjunctivae normal ENMT: Mucous membranes are moist. Posterior pharynx clear of any exudate or lesions.   Neck: normal, supple, no masses, no thyromegaly Respiratory: clear to auscultation bilaterally, no wheezing, no crackles. Normal respiratory effort.    Cardiovascular: S1 & S2 heard, regular rate and rhythm. No significant JVD. Abdomen: No distension, no tenderness, no masses palpated. Bowel sounds normal.  Musculoskeletal: no  clubbing / cyanosis. No joint deformity upper and lower extremities. Status-post right BKA.  Skin: no significant rashes, lesions, ulcers. Poor turgor. Neurologic: CN 2-12 grossly intact. Sensation intact. Mild general weakness with extremity strength 4/5 throughout.  Psychiatric: Alert and oriented x 3. Pleasant and cooperative.     Labs on Admission: I have personally reviewed following labs and imaging studies  CBC: Recent Labs  Lab 08/20/17 1603  WBC 6.3  NEUTROABS 4.6  HGB 10.0*  HCT 31.2*  MCV 87.6  PLT 403   Basic Metabolic Panel: Recent Labs  Lab 08/20/17 1603  NA 141  K 4.2  CL 105  CO2 26  GLUCOSE 215*  BUN 37*  CREATININE 2.31*  CALCIUM 8.4*   GFR: Estimated Creatinine Clearance: 26.6 mL/min (A) (by C-G formula based on SCr of 2.31 mg/dL (H)). Liver Function Tests: Recent Labs  Lab 08/20/17 1603  AST 19  ALT 19  ALKPHOS 96  BILITOT 0.5  PROT 7.0  ALBUMIN 3.2*   No results for input(s): LIPASE, AMYLASE in the last 168 hours. No results for input(s): AMMONIA in the last 168 hours. Coagulation Profile: No results for input(s): INR, PROTIME in the last 168 hours. Cardiac Enzymes: Recent Labs  Lab 08/20/17 1603  TROPONINI 0.03*   BNP (last 3 results) No results for input(s): PROBNP in the last 8760 hours. HbA1C: No results for input(s): HGBA1C in the last 72 hours. CBG: No results for input(s): GLUCAP in the last 168 hours. Lipid Profile: No results for input(s): CHOL, HDL, LDLCALC, TRIG, CHOLHDL, LDLDIRECT in the last 72 hours. Thyroid Function Tests: No results for input(s): TSH, T4TOTAL, FREET4, T3FREE, THYROIDAB in the last 72 hours. Anemia Panel: No results for input(s): VITAMINB12, FOLATE, FERRITIN, TIBC, IRON, RETICCTPCT in the last 72 hours. Urine analysis:    Component Value Date/Time   COLORURINE YELLOW 08/20/2017 1536   APPEARANCEUR CLOUDY (A) 08/20/2017 1536   LABSPEC 1.019 08/20/2017 1536   PHURINE 5.0 08/20/2017 1536    GLUCOSEU 150 (A) 08/20/2017 1536   HGBUR SMALL (A) 08/20/2017 1536   BILIRUBINUR NEGATIVE 08/20/2017 1536   KETONESUR NEGATIVE 08/20/2017 1536   PROTEINUR >=300 (A) 08/20/2017 1536   UROBILINOGEN 0.2 01/13/2014 0232   NITRITE NEGATIVE 08/20/2017 1536   LEUKOCYTESUR NEGATIVE 08/20/2017 1536   Sepsis Labs: @LABRCNTIP (procalcitonin:4,lacticidven:4) )No results found for this or any previous visit (from the past 240 hour(s)).  Radiological Exams on Admission: Dg Chest 2 View  Result Date: 08/20/2017 CLINICAL DATA:  Weakness.  Two falls in the last 24 hours. EXAM: CHEST - 2 VIEW COMPARISON:  December 02, 2016 FINDINGS: Cardiomegaly is improved since December 02, 2016 but does persist. The hila and mediastinum are normal. No pneumothorax. No pulmonary nodules or masses. No focal infiltrates. No overt edema. IMPRESSION: No active cardiopulmonary disease. Electronically Signed   By: Dorise Bullion III M.D   On: 08/20/2017 16:33   Ct Head Wo Contrast  Result Date: 08/20/2017 CLINICAL DATA:  Fall with head injury. EXAM: CT HEAD WITHOUT CONTRAST CT CERVICAL SPINE WITHOUT CONTRAST TECHNIQUE: Multidetector CT imaging of the head and cervical spine was performed following the standard protocol without intravenous contrast. Multiplanar CT image reconstructions of the cervical spine were also generated. COMPARISON:  Head CT 01/13/2014 FINDINGS: CT HEAD FINDINGS Brain: There is no evidence for acute hemorrhage, hydrocephalus, mass lesion, or abnormal extra-axial fluid collection. No definite CT evidence for acute infarction. Diffuse loss of parenchymal volume is consistent with atrophy. Patchy low attenuation in the deep hemispheric and periventricular white matter is nonspecific, but likely reflects chronic microvascular ischemic demyelination. Vascular: Atherosclerotic calcification of the carotid siphons noted at the skull base. No dense MCA sign. Skull: No evidence for fracture. No worrisome lytic or sclerotic  lesion. Sinuses/Orbits: The visualized paranasal sinuses and mastoid air cells are clear. Visualized portions of the globes and intraorbital fat are unremarkable. Other: None. CT CERVICAL SPINE FINDINGS Alignment: Straightening of normal cervical lordosis. Trace anterolisthesis of C4 on 5 is compatible with the associated facet osteoarthritis. Skull base and vertebrae: No acute fracture. No primary bone lesion or focal pathologic process. Soft tissues and spinal canal: No prevertebral fluid or swelling. No visible canal hematoma. Disc levels: Loss of disc height is noted at C5-6 and C6-7 with endplate degeneration noted around the C6-7 disc. Upper chest: Negative. Other: None. IMPRESSION: 1. No acute intracranial abnormality. Atrophy with chronic small vessel white matter ischemic disease. 2. No cervical spine fracture.  Lower cervical degenerative changes. 3. Loss of cervical lordosis. This can be related to patient positioning, muscle spasm or soft tissue injury. Electronically Signed   By: Misty Stanley M.D.   On: 08/20/2017 16:47   Ct Cervical Spine Wo Contrast  Result Date: 08/20/2017 CLINICAL DATA:  Fall with head injury. EXAM: CT HEAD WITHOUT CONTRAST CT CERVICAL SPINE WITHOUT CONTRAST TECHNIQUE: Multidetector CT imaging of the head and cervical spine was performed following the standard protocol without intravenous contrast. Multiplanar CT image reconstructions of the cervical spine were also generated. COMPARISON:  Head CT 01/13/2014 FINDINGS: CT HEAD FINDINGS Brain: There is no evidence for acute hemorrhage, hydrocephalus, mass lesion, or abnormal extra-axial fluid collection. No definite CT evidence for acute infarction. Diffuse loss of parenchymal volume is consistent with atrophy. Patchy low attenuation in the deep hemispheric and periventricular white matter is nonspecific, but likely reflects chronic microvascular ischemic demyelination. Vascular: Atherosclerotic calcification of the carotid  siphons noted at the skull base. No dense MCA sign. Skull: No evidence for fracture. No worrisome lytic or sclerotic lesion. Sinuses/Orbits: The visualized paranasal sinuses and mastoid air cells are clear. Visualized portions of the globes and intraorbital fat are unremarkable. Other: None. CT CERVICAL SPINE FINDINGS Alignment: Straightening of normal cervical lordosis. Trace anterolisthesis of C4 on 5 is compatible with the associated facet osteoarthritis. Skull base and vertebrae: No acute fracture. No primary bone lesion or focal pathologic process. Soft tissues and spinal canal:  No prevertebral fluid or swelling. No visible canal hematoma. Disc levels: Loss of disc height is noted at C5-6 and C6-7 with endplate degeneration noted around the C6-7 disc. Upper chest: Negative. Other: None. IMPRESSION: 1. No acute intracranial abnormality. Atrophy with chronic small vessel white matter ischemic disease. 2. No cervical spine fracture.  Lower cervical degenerative changes. 3. Loss of cervical lordosis. This can be related to patient positioning, muscle spasm or soft tissue injury. Electronically Signed   By: Misty Stanley M.D.   On: 08/20/2017 16:47    EKG: Independently reviewed. Sinus rhythm, non-specific T-wave abnormalities.   Assessment/Plan  1. Generalized weakness; multiple falls  - Patient is brought in by family after multiple falls at home  - She has been experiencing slowly progressive gen weakness over the course of several months without focal deficit on exam or acute finding on head CT  - Suspect this is related to deconditioning, but will evaluate for reversible etiologies with TSH, B12, and folate levels  - PT eval requested  - Continue supportive care    2. Type II DM  - No recent A1c, was 10.9% remotely  - Managed at home with Humalog 50/50 20 units TID  - Check CBG's and use Lantus 20 units qHS and Novolog sliding-scale initially while in hospital   3. CKD stage IV - SCr is 2.31  on admission, up from 1.8 last September  - Appeared hypovolemic on presentation and there may be an acute component - Treated in ED with 250 cc NS  - Renally-dose medications, hold Lasix, continue gentle IVF hydration overnight, repeat chem panel in am    4. Hypertension  - BP at goal  - Continue hydralazine and Lopressor    5. RLS  - Continue Requip   6. PAD  - No acute ischemia  - Continue Pletal and statin    7. Elevated troponin - Troponin slightly elevated to 0.03 on admission  - No anginal complaints  - Trend troponin, continue statin and beta-blocker    8. Chronic diastolic CHF  - Appears hypovolemic on admission, received 250 cc NS in ED  - Hold Lasix, continue gentle IVF hydration, follow daily wts, continue beta-blocker    DVT prophylaxis: sq heparin  Code Status: Full  Family Communication: Daughter updated at bedside Consults called: None Admission status: Observation    Vianne Bulls, MD [facility] Pager [phone]  If 7PM-7AM, please contact night-coverage [email or web address] Password [facility]  08/20/2017, 7:52 PM

## 2017-08-20 NOTE — ED Triage Notes (Signed)
Pt has fallen twice in the past 24 hours.  First was assisted to the floor from couch and the second was from standing position backwards and hit her head.

## 2017-08-20 NOTE — ED Notes (Signed)
Pt states she was not using her walker when she fell like she is supposed to.

## 2017-08-20 NOTE — ED Provider Notes (Signed)
[facility] EMERGENCY DEPARTMENT Provider Note   CSN: [id number] Arrival date & time: 08/20/17  1520     History   Chief Complaint Chief Complaint  Patient presents with  . Fall    HPI Mary [name] is a 72 y.o. female.  HPI  Pt was seen at 1530. Per pt, c/o sudden onset and resolution of 2 separate episodes of falling that occurred today. Pt states her "legs just give out" and she falls. States she "just feels weak."  States she hit her head the 2nd time she fell today. Denies syncope/near syncope, no focal motor weakness, no tingling/numbness in extremities, no abd pain, no N/V/D, no fevers, no SOB/cough, no CP/palpitations.   Past Medical History:  Diagnosis Date  . Anemia   . Arthritis    knees  . Cellulitis and abscess of foot 02/12/2012  . CHF (congestive heart failure) ([place])   . Complication of anesthesia    pt states after her hysterectomy the doctor said she was "wild" when she woke up   . Diabetes mellitus   . Diabetes mellitus with neuropathy 07/25/2011  . Dysrhythmia    tachy- takes Metoprolol  . OSA (obstructive sleep apnea) 07/25/2011   not using CPAP  . Osteomyelitis of right foot ([place]) 02/14/2012  . Partial Achilles tendon tear 02/14/2012  . Restless leg syndrome 07/25/2011  . Wound, open    right foot    Patient Active Problem List   Diagnosis Date Noted  . Pericardial effusion 12/15/2016  . Bilateral carotid bruits 12/15/2016  . Cardiomyopathy ([place])   . Left ventricular dysfunction   . Acute on chronic combined systolic and diastolic CHF (congestive heart failure) ([place])   . Right ventricular dysfunction   . CKD (chronic kidney disease), stage III ([place]) 11/30/2016  . CHF exacerbation ([place]) 11/28/2016  . Fall 11/28/2016  . Acute on chronic diastolic CHF (congestive heart failure) ([place]) 11/28/2016  . Elevated troponin 07/19/2015  . Diastolic CHF, acute on chronic (HCC) 01/13/2014  . Expressive aphasia 01/13/2014  . CHF (congestive heart  failure) ([place])   . Unstable balance 01/16/2013  . Difficulty in walking(719.7) 01/16/2013  . Infected wound 01/16/2013  . Hx of right BKA ([place]) 12/23/2012  . Candidiasis of breast 12/08/2012  . S/P bilateral BKA (below knee amputation) ([place]) 11/30/2012  . Diabetes ([place]) 11/30/2012  . Diabetic foot ulcer ([place]) 10/08/2012  . Anxiety 02/17/2012  . Osteomyelitis of right foot ([place]) 02/14/2012  . Hematuria, microscopic 02/13/2012  . PVD (peripheral vascular disease) ([place]) 02/08/2012  . Open wound of heel 02/08/2012  . Hyperlipidemia 11/11/2011  . Hypertension 11/11/2011  . Morbid obesity ([place]) 11/11/2011  . Tachycardia 08/04/2011  . Acute on chronic diastolic heart failure ([place]) 07/25/2011  . Diabetes mellitus with neuropathy 07/25/2011  . OSA (obstructive sleep apnea) 07/25/2011  . Generalized weakness 07/25/2011  . Acute respiratory failure ([place]) 07/25/2011  . Restless leg syndrome 07/25/2011  . Anemia 07/25/2011    Past Surgical History:  Procedure Laterality Date  . ABDOMINAL HYSTERECTOMY    . ACHILLES TENDON REPAIR    . AMPUTATION Right 11/25/2012   Procedure: AMPUTATION BELOW KNEE;  Surgeon: Newt Minion, MD;  Location: [facility];  Service: Orthopedics;  Laterality: Right;  Right Below Knee Amputation  . BELOW KNEE LEG AMPUTATION Right 11/25/2012   Dr Sharol Given  . BREAST SURGERY Left    Lumpectomy non ca  . CATARACT EXTRACTION W/PHACO Right 10/09/2013   Procedure: CATARACT EXTRACTION PHACO AND INTRAOCULAR LENS PLACEMENT (  [place]);  Surgeon: Elta Guadeloupe T. Gershon Crane, MD;  Location: [facility];  Service: Ophthalmology;  Laterality: Right;  CDE:9.05  . CATARACT EXTRACTION W/PHACO Left 10/23/2013   Procedure: ATTEMPTED CATARACT EXTRACTION PHACO AND INTRAOCULAR LENS PLACEMENT;  Surgeon: Elta Guadeloupe T. Gershon Crane, MD;  Location: [facility];  Service: Ophthalmology;  Laterality: Left;  CDE:  4.41  . TOE AMPUTATION     partial rt great toe     OB History    Gravida  1   Para  1   Term  1   Preterm      AB       Living  1     SAB      TAB      Ectopic      Multiple      Live Births               Home Medications    Prior to Admission medications   Medication Sig Start Date End Date Taking? Authorizing Provider  acetaminophen (TYLENOL) 325 MG tablet Take 2 tablets (650 mg total) by mouth every 4 (four) hours as needed for headache or mild pain. 12/06/16   Orvan Falconer, MD  carbidopa-levodopa (SINEMET) 25-100 MG tablet Take 1 tablet by mouth at bedtime as needed (For restless leg.). 12/06/16 12/06/17  Orvan Falconer, MD  cholecalciferol (VITAMIN D) 1000 UNITS tablet Take 1,000 Units by mouth daily.     [provider]  cilostazol (PLETAL) 100 MG tablet Take 100 mg by mouth 2 (two) times daily.  02/15/13   [provider]  compounded topicals builder Apply 1 application topically. KET/DIC/BAC/LID/M/GUA    [provider]  furosemide (LASIX) 20 MG tablet Take 20 mg by mouth.    [provider]  gabapentin (NEURONTIN) 800 MG tablet Take 800 mg by mouth 3 (three) times daily.  04/09/16   [provider]  hydrALAZINE (APRESOLINE) 25 MG tablet Take 1.5 tablets (37.5 mg total) by mouth 3 (three) times daily. 06/09/17   Arnoldo Lenis, MD  insulin lispro protamine-lispro (HUMALOG 50/50 MIX) (50-50) 100 UNIT/ML SUSP injection Inject 0.2 mLs (20 Units total) into the skin 3 (three) times daily. Patient taking differently: Inject 20-25 Units into the skin 3 (three) times daily. Patient self adjusts amount based on sugar levels 07/22/15   Kelvin Cellar, MD  isosorbide mononitrate (IMDUR) 30 MG 24 hr tablet Take 0.5 tablets (15 mg total) by mouth daily. 12/06/16   Orvan Falconer, MD  lidocaine-prilocaine (EMLA) cream Apply 1 application topically as needed.  11/10/16   [provider]  metoprolol succinate (TOPROL-XL) 50 MG 24 hr tablet Take 1 tablet (50 mg total) by mouth 2 (two) times daily. Take with or immediately following a meal. 02/07/17   Branch, Alphonse Guild, MD    pravastatin (PRAVACHOL) 40 MG tablet Take 40 mg by mouth daily.    [provider]  Tolnaftate (ABSORBINE JR EX) Apply 1 application topically daily as needed (for pain).    [provider]    Family History Family History  Problem Relation Age of Onset  . Diabetes Sister     Social History Social History   Tobacco Use  . Smoking status: Never Smoker  . Smokeless tobacco: Never Used  Substance Use Topics  . Alcohol use: No  . Drug use: No     Allergies   Patient has no known allergies.   Review of Systems Review of Systems ROS: Statement: All systems negative except as marked  or noted in the HPI; Constitutional: Negative for fever and chills. +generalized weakness.; ; Eyes: Negative for eye pain, redness and discharge. ; ; ENMT: Negative for ear pain, hoarseness, nasal congestion, sinus pressure and sore throat. ; ; Cardiovascular: Negative for chest pain, palpitations, diaphoresis, dyspnea and peripheral edema. ; ; Respiratory: Negative for cough, wheezing and stridor. ; ; Gastrointestinal: Negative for nausea, vomiting, diarrhea, abdominal pain, blood in stool, hematemesis, jaundice and rectal bleeding. . ; ; Genitourinary: Negative for dysuria, flank pain and hematuria. ; ; Musculoskeletal: +head injury. Negative for back pain and neck pain. Negative for swelling and deformity.; ; Skin: Negative for pruritus, rash, abrasions, blisters, bruising and skin lesion.; ; Neuro: Negative for headache, lightheadedness and neck stiffness. Negative for altered level of consciousness, altered mental status, extremity weakness, paresthesias, involuntary movement, seizure and syncope.      Physical Exam Updated Vital Signs BP (!) 171/78 (BP Location: Left Arm)   Pulse 90   Temp 97.8 F (36.6 C) (Oral)   Resp 18   Ht 5\' 4"  (1.626 m)   Wt 106.1 kg (234 lb)   LMP 07/25/2011   SpO2 100%   BMI 40.17 kg/m    BP 102/83   Pulse 97   Temp 97.8 F (36.6 C) (Oral)    Resp 20   Ht 5\' 4"  (1.626 m)   Wt 106.1 kg (234 lb)   LMP 07/25/2011   SpO2 98%   BMI 40.17 kg/m    15:44 Orthostatic Vital Signs LA  Orthostatic Lying   BP- Lying: 171/78   Pulse- Lying: 89       Orthostatic Sitting  BP- Sitting: 197/86   Pulse- Sitting: 101       Orthostatic Standing at 0 minutes  BP- Standing at 0 minutes: 197/87   Pulse- Standing at 0 minutes: 95     Physical Exam 1535: Physical examination:  Nursing notes reviewed; Vital signs and O2 SAT reviewed;  Constitutional: Well developed, Well nourished, Well hydrated, In no acute distress; Head:  Normocephalic, atraumatic; Eyes: EOMI, PERRL, No scleral icterus; ENMT: Mouth and pharynx normal, Mucous membranes moist; Neck: Supple, Full range of motion, No lymphadenopathy; Cardiovascular: Regular rate and rhythm, No gallop; Respiratory: Breath sounds clear & equal bilaterally, No wheezes.  Speaking full sentences with ease, Normal respiratory effort/excursion; Chest: Nontender, Movement normal; Abdomen: Soft, Nontender, Nondistended, Normal bowel sounds; Genitourinary: No CVA tenderness; Spine:  No midline CS, TS, LS tenderness.;; Extremities: +right BKA. Peripheral pulses x3 extremities normal, Pelvis stable. No tenderness, no deformity. +1 LLE edema.; Neuro: AA&Ox3, Major CN grossly intact. No facial droop. Speech clear. No gross focal motor or sensory deficits in extremities.; Skin: Color normal, Warm, Dry.   ED Treatments / Results  Labs (all labs ordered are listed, but only abnormal results are displayed)   EKG EKG Interpretation  Date/Time:  Saturday August 20 2017 15:52:50 EDT Ventricular Rate:  91 PR Interval:    QRS Duration: 93 QT Interval:  382 QTC Calculation: 470 R Axis:   13 Text Interpretation:  Sinus rhythm Anteroseptal infarct, old Borderline T abnormalities, inferior leads When compared with ECG of 11/29/2016 Nonspecific T wave abnormality is now Present Confirmed by Francine Graven [id number])  on 08/20/2017 4:16:17 PM   Radiology   Procedures Procedures (including critical care time)  Medications Ordered in ED Medications - No data to display   Initial Impression / Assessment and Plan / ED Course  I have reviewed the triage vital signs and the nursing  notes.  Pertinent labs & imaging results that were available during my care of the patient were reviewed by me and considered in my medical decision making (see chart for details).  MDM Reviewed: previous chart, nursing note and vitals Reviewed previous: labs and ECG Interpretation: labs, ECG, x-ray and CT scan   Results for orders placed or performed during the hospital encounter of 08/20/17  Urinalysis, Routine w reflex microscopic  Result Value Ref Range   Color, Urine YELLOW YELLOW   APPearance CLOUDY (A) CLEAR   Specific Gravity, Urine 1.019 1.005 - 1.030   pH 5.0 5.0 - 8.0   Glucose, UA 150 (A) NEGATIVE mg/dL   Hgb urine dipstick SMALL (A) NEGATIVE   Bilirubin Urine NEGATIVE NEGATIVE   Ketones, ur NEGATIVE NEGATIVE mg/dL   Protein, ur >=300 (A) NEGATIVE mg/dL   Nitrite NEGATIVE NEGATIVE   Leukocytes, UA NEGATIVE NEGATIVE   RBC / HPF 6-30 0 - 5 RBC/hpf   WBC, UA 0-5 0 - 5 WBC/hpf   Bacteria, UA RARE (A) NONE SEEN   Squamous Epithelial / LPF 6-30 (A) NONE SEEN   Budding Yeast PRESENT   Comprehensive metabolic panel  Result Value Ref Range   Sodium 141 135 - 145 mmol/L   Potassium 4.2 3.5 - 5.1 mmol/L   Chloride 105 101 - 111 mmol/L   CO2 26 22 - 32 mmol/L   Glucose, Bld 215 (H) 65 - 99 mg/dL   BUN 37 (H) 6 - 20 mg/dL   Creatinine, Ser 2.31 (H) 0.44 - 1.00 mg/dL   Calcium 8.4 (L) 8.9 - 10.3 mg/dL   Total Protein 7.0 6.5 - 8.1 g/dL   Albumin 3.2 (L) 3.5 - 5.0 g/dL   AST 19 15 - 41 U/L   ALT 19 14 - 54 U/L   Alkaline Phosphatase 96 38 - 126 U/L   Total Bilirubin 0.5 0.3 - 1.2 mg/dL   GFR calc non Af Amer 20 (L) >60 mL/min   GFR calc Af Amer 23 (L) >60 mL/min   Anion gap 10 5 - 15  Troponin I    Result Value Ref Range   Troponin I 0.03 (HH) <0.03 ng/mL  Lactic acid, plasma  Result Value Ref Range   Lactic Acid, Venous 1.2 0.5 - 1.9 mmol/L  Lactic acid, plasma  Result Value Ref Range   Lactic Acid, Venous 1.1 0.5 - 1.9 mmol/L  CBC with Differential  Result Value Ref Range   WBC 6.3 4.0 - 10.5 K/uL   RBC 3.56 (L) 3.87 - 5.11 MIL/uL   Hemoglobin 10.0 (L) 12.0 - 15.0 g/dL   HCT 31.2 (L) 36.0 - 46.0 %   MCV 87.6 78.0 - 100.0 fL   MCH 28.1 26.0 - 34.0 pg   MCHC 32.1 30.0 - 36.0 g/dL   RDW 14.3 11.5 - 15.5 %   Platelets 277 150 - 400 K/uL   Neutrophils Relative % 72 %   Neutro Abs 4.6 1.7 - 7.7 K/uL   Lymphocytes Relative 13 %   Lymphs Abs 0.8 0.7 - 4.0 K/uL   Monocytes Relative 10 %   Monocytes Absolute 0.6 0.1 - 1.0 K/uL   Eosinophils Relative 4 %   Eosinophils Absolute 0.2 0.0 - 0.7 K/uL   Basophils Relative 1 %   Basophils Absolute 0.0 0.0 - 0.1 K/uL  Brain natriuretic peptide  Result Value Ref Range   B Natriuretic Peptide 387.0 (H) 0.0 - 100.0 pg/mL  Ethanol  Result Value Ref Range  Alcohol, Ethyl (B) <10 <10 mg/dL  Urine rapid drug screen (hosp performed)  Result Value Ref Range   Opiates NONE DETECTED NONE DETECTED   Cocaine NONE DETECTED NONE DETECTED   Benzodiazepines POSITIVE (A) NONE DETECTED   Amphetamines NONE DETECTED NONE DETECTED   Tetrahydrocannabinol NONE DETECTED NONE DETECTED   Barbiturates NONE DETECTED NONE DETECTED   Dg Chest 2 View Result Date: 08/20/2017 CLINICAL DATA:  Weakness.  Two falls in the last 24 hours. EXAM: CHEST - 2 VIEW COMPARISON:  December 02, 2016 FINDINGS: Cardiomegaly is improved since December 02, 2016 but does persist. The hila and mediastinum are normal. No pneumothorax. No pulmonary nodules or masses. No focal infiltrates. No overt edema. IMPRESSION: No active cardiopulmonary disease. Electronically Signed   By: Dorise Bullion III M.D   On: 08/20/2017 16:33   Ct Head Wo Contrast Result Date: 08/20/2017 CLINICAL DATA:  Fall  with head injury. EXAM: CT HEAD WITHOUT CONTRAST CT CERVICAL SPINE WITHOUT CONTRAST TECHNIQUE: Multidetector CT imaging of the head and cervical spine was performed following the standard protocol without intravenous contrast. Multiplanar CT image reconstructions of the cervical spine were also generated. COMPARISON:  Head CT 01/13/2014 FINDINGS: CT HEAD FINDINGS Brain: There is no evidence for acute hemorrhage, hydrocephalus, mass lesion, or abnormal extra-axial fluid collection. No definite CT evidence for acute infarction. Diffuse loss of parenchymal volume is consistent with atrophy. Patchy low attenuation in the deep hemispheric and periventricular white matter is nonspecific, but likely reflects chronic microvascular ischemic demyelination. Vascular: Atherosclerotic calcification of the carotid siphons noted at the skull base. No dense MCA sign. Skull: No evidence for fracture. No worrisome lytic or sclerotic lesion. Sinuses/Orbits: The visualized paranasal sinuses and mastoid air cells are clear. Visualized portions of the globes and intraorbital fat are unremarkable. Other: None. CT CERVICAL SPINE FINDINGS Alignment: Straightening of normal cervical lordosis. Trace anterolisthesis of C4 on 5 is compatible with the associated facet osteoarthritis. Skull base and vertebrae: No acute fracture. No primary bone lesion or focal pathologic process. Soft tissues and spinal canal: No prevertebral fluid or swelling. No visible canal hematoma. Disc levels: Loss of disc height is noted at C5-6 and C6-7 with endplate degeneration noted around the C6-7 disc. Upper chest: Negative. Other: None. IMPRESSION: 1. No acute intracranial abnormality. Atrophy with chronic small vessel white matter ischemic disease. 2. No cervical spine fracture.  Lower cervical degenerative changes. 3. Loss of cervical lordosis. This can be related to patient positioning, muscle spasm or soft tissue injury. Electronically Signed   By: Misty Stanley M.D.   On: 08/20/2017 16:47   Ct Cervical Spine Wo Contrast Result Date: 08/20/2017 CLINICAL DATA:  Fall with head injury. EXAM: CT HEAD WITHOUT CONTRAST CT CERVICAL SPINE WITHOUT CONTRAST TECHNIQUE: Multidetector CT imaging of the head and cervical spine was performed following the standard protocol without intravenous contrast. Multiplanar CT image reconstructions of the cervical spine were also generated. COMPARISON:  Head CT 01/13/2014 FINDINGS: CT HEAD FINDINGS Brain: There is no evidence for acute hemorrhage, hydrocephalus, mass lesion, or abnormal extra-axial fluid collection. No definite CT evidence for acute infarction. Diffuse loss of parenchymal volume is consistent with atrophy. Patchy low attenuation in the deep hemispheric and periventricular white matter is nonspecific, but likely reflects chronic microvascular ischemic demyelination. Vascular: Atherosclerotic calcification of the carotid siphons noted at the skull base. No dense MCA sign. Skull: No evidence for fracture. No worrisome lytic or sclerotic lesion. Sinuses/Orbits: The visualized paranasal sinuses and mastoid air cells are clear.  Visualized portions of the globes and intraorbital fat are unremarkable. Other: None. CT CERVICAL SPINE FINDINGS Alignment: Straightening of normal cervical lordosis. Trace anterolisthesis of C4 on 5 is compatible with the associated facet osteoarthritis. Skull base and vertebrae: No acute fracture. No primary bone lesion or focal pathologic process. Soft tissues and spinal canal: No prevertebral fluid or swelling. No visible canal hematoma. Disc levels: Loss of disc height is noted at C5-6 and C6-7 with endplate degeneration noted around the C6-7 disc. Upper chest: Negative. Other: None. IMPRESSION: 1. No acute intracranial abnormality. Atrophy with chronic small vessel white matter ischemic disease. 2. No cervical spine fracture.  Lower cervical degenerative changes. 3. Loss of cervical lordosis. This  can be related to patient positioning, muscle spasm or soft tissue injury. Electronically Signed   By: Misty Stanley M.D.   On: 08/20/2017 16:47   Results for LIANNAH, YARBOUGH (MRN [id number]) as of 08/20/2017 17:46  Ref. Range 12/02/2016 15:00 12/03/2016 07:12 12/15/2016 14:14 02/07/2017 10:24 08/20/2017 16:03  BUN Latest Ref Range: 6 - 20 mg/dL 43 (H) 45 (H) 47 (H) 37 (H) 37 (H)  Creatinine Latest Ref Range: 0.44 - 1.00 mg/dL 2.18 (H) 2.10 (H) 2.31 (H) 1.79 (H) 2.31 (H)   Results for KEAH, LAMBA (MRN [id number]) as of 08/20/2017 17:46  Ref. Range 04/13/2016 17:36 11/28/2016 12:25 12/02/2016 06:07 12/03/2016 07:12 08/20/2017 16:03  Hemoglobin Latest Ref Range: 12.0 - 15.0 g/dL 9.9 (L) 9.7 (L) 10.1 (L) 9.9 (L) 10.0 (L)  HCT Latest Ref Range: 36.0 - 46.0 % 32.1 (L) 30.9 (L) 33.2 (L) 31.4 (L) 31.2 (L)    1920:  Not orthostatic on VS. Unable to stand or ambulate even just a few steps without significant assist x2. ED RN states pt's family told him that pt fell yesterday also and laid on the floor for some time. Will add CK total and dose judicious IVF. T/C returned from [facility]. Myna Hidalgo, case discussed, including:  HPI, pertinent PM/SHx, VS/PE, dx testing, ED course and treatment:  Agreeable to admit.    Final Clinical Impressions(s) / ED Diagnoses   Final diagnoses:  None    ED Discharge Orders    None       Francine Graven, DO 08/21/17 1752

## 2017-08-20 NOTE — ED Notes (Signed)
Dr. Opyd at bedside  

## 2017-08-20 NOTE — ED Notes (Signed)
Patient transported to CT 

## 2017-08-21 DIAGNOSIS — N184 Chronic kidney disease, stage 4 (severe): Secondary | ICD-10-CM | POA: Diagnosis not present

## 2017-08-21 DIAGNOSIS — R5381 Other malaise: Secondary | ICD-10-CM | POA: Diagnosis not present

## 2017-08-21 LAB — BASIC METABOLIC PANEL
ANION GAP: 8 (ref 5–15)
BUN: 34 mg/dL — AB (ref 6–20)
CALCIUM: 7.8 mg/dL — AB (ref 8.9–10.3)
CO2: 23 mmol/L (ref 22–32)
CREATININE: 1.77 mg/dL — AB (ref 0.44–1.00)
Chloride: 110 mmol/L (ref 101–111)
GFR calc Af Amer: 32 mL/min — ABNORMAL LOW (ref >60)
GFR calc non Af Amer: 28 mL/min — ABNORMAL LOW (ref >60)
GLUCOSE: 115 mg/dL — AB (ref 65–99)
Potassium: 3.6 mmol/L (ref 3.5–5.1)
Sodium: 141 mmol/L (ref 135–145)

## 2017-08-21 LAB — CBC
HCT: 27.3 % — ABNORMAL LOW (ref 36.0–46.0)
HEMOGLOBIN: 8.8 g/dL — AB (ref 12.0–15.0)
MCH: 28.3 pg (ref 26.0–34.0)
MCHC: 32.2 g/dL (ref 30.0–36.0)
MCV: 87.8 fL (ref 78.0–100.0)
Platelets: 248 10*3/uL (ref 150–400)
RBC: 3.11 MIL/uL — ABNORMAL LOW (ref 3.87–5.11)
RDW: 14.4 % (ref 11.5–15.5)
WBC: 5.6 10*3/uL (ref 4.0–10.5)

## 2017-08-21 LAB — GLUCOSE, CAPILLARY
GLUCOSE-CAPILLARY: 101 mg/dL — AB (ref 65–99)
GLUCOSE-CAPILLARY: 76 mg/dL (ref 65–99)
Glucose-Capillary: 117 mg/dL — ABNORMAL HIGH (ref 65–99)
Glucose-Capillary: 83 mg/dL (ref 65–99)

## 2017-08-21 LAB — TROPONIN I
TROPONIN I: 0.03 ng/mL — AB (ref <0.03)
Troponin I: 0.04 ng/mL (ref <0.03)

## 2017-08-21 MED ORDER — GABAPENTIN 400 MG PO CAPS: 800.0000 mg | ORAL_CAPSULE | Freq: Three times a day (TID) | ORAL | Status: DC

## 2017-08-21 NOTE — Progress Notes (Signed)
Patient seen and examined, database reviewed.  No family members at bedside, however did speak to her daughter via phone this morning.  Patient was admitted for placement purposes.  Social services had already been in contact with patient and her PCP due to unsafe house conditions.  On day of admission, patient was found to be on the floor for over 2 hours.  Patient's daughter called social services and was instructed to take patient to her house and that they would try to secure placement at the beginning of the week.  Unfortunately while they were packing her clothes she suffered a fall during which she hit her head.  Patient was brought into the hospital for evaluation following that fall.  Fortunately she had no head injuries.  Labs show a creatinine of 1.77, which is her baseline, hemoglobin of 8.8, no prior to compare.  Have discussed case via phone with social work multiple times throughout the day.  It appears only solution for today is for patient to spend an extra night in the hospital with discharge to skilled nursing facility in the morning.  We feel it is unsafe for patient to discharge home.  She is noted to be a little sleepy today.  She was apparently given Ativan overnight in addition to her home Neurontin and Valium.  We will hold all sedating meds.  Continue to follow.  Domingo Mend, MD [facility] Pager: [phone]

## 2017-08-21 NOTE — Progress Notes (Signed)
CSW was consulted by physician to assist pt with possible placement for SNF.  CSW spoke with pt's daughter Marcial Pacas [phone] by phone.  Pt's daughter explained to CSW the need for pt placement to SNF due to pt's falling several times.  Pt's daughter would prefer to have mother placed at [facility].  CSW contacted Gerald Stabs at [facility] for possible SNF placement.  Pt's daughter and Gerald Stabs will make contact for possibly paying out of pocket until [place] for insurance (insurance information in system for [employer] has been terminated)  Pt's daughter has given nurse updated insurance information to update.  Reed Breech LCSWA [phone]

## 2017-08-21 NOTE — Progress Notes (Signed)
Have been unable to give any PO meds due to patients lethargy, patient received sedating medications over night shift and has been difficult to arouse.  Patient will open eyes briefly and sometimes mumble to sternal rub.  MD made aware - sedating medications DC'd.  Will continue to monitor.

## 2017-08-21 NOTE — Progress Notes (Signed)
Patient continues to be lethargic.  Only awakening for brief moments when aroused by touch/voice.  BP elevated and MD made aware - no new orders at this time.  Continue to monitor.  Will urge next shift to avoid any sedating meds as much as possible.

## 2017-08-21 NOTE — Progress Notes (Signed)
PT Cancellation Note  Patient Details Name: Mary [name] MRN: [id number] DOB: 01/24/1946   Cancelled Treatment:    Reason Eval/Treat Not Completed: Fatigue/lethargy limiting ability to participate Spoke with patient's nurse and she reported that patient has been lethargic and difficult to arouse at this time and has had limited response to tactile and verbal stimuli. Will attempt at a later date.   Clarene Critchley PT, DPT 12:51 PM, 08/21/17 [phone]

## 2017-08-22 DIAGNOSIS — G2581 Restless legs syndrome: Secondary | ICD-10-CM | POA: Diagnosis not present

## 2017-08-22 DIAGNOSIS — R2681 Unsteadiness on feet: Secondary | ICD-10-CM | POA: Diagnosis not present

## 2017-08-22 DIAGNOSIS — W19XXXD Unspecified fall, subsequent encounter: Secondary | ICD-10-CM | POA: Diagnosis not present

## 2017-08-22 DIAGNOSIS — Z741 Need for assistance with personal care: Secondary | ICD-10-CM | POA: Diagnosis not present

## 2017-08-22 DIAGNOSIS — N184 Chronic kidney disease, stage 4 (severe): Secondary | ICD-10-CM | POA: Diagnosis not present

## 2017-08-22 DIAGNOSIS — R54 Age-related physical debility: Secondary | ICD-10-CM | POA: Diagnosis not present

## 2017-08-22 DIAGNOSIS — R5381 Other malaise: Secondary | ICD-10-CM | POA: Diagnosis not present

## 2017-08-22 DIAGNOSIS — N183 Chronic kidney disease, stage 3 (moderate): Secondary | ICD-10-CM | POA: Diagnosis not present

## 2017-08-22 DIAGNOSIS — Z794 Long term (current) use of insulin: Secondary | ICD-10-CM | POA: Diagnosis not present

## 2017-08-22 DIAGNOSIS — E1140 Type 2 diabetes mellitus with diabetic neuropathy, unspecified: Secondary | ICD-10-CM | POA: Diagnosis not present

## 2017-08-22 DIAGNOSIS — B3789 Other sites of candidiasis: Secondary | ICD-10-CM | POA: Diagnosis not present

## 2017-08-22 DIAGNOSIS — D649 Anemia, unspecified: Secondary | ICD-10-CM | POA: Diagnosis not present

## 2017-08-22 DIAGNOSIS — Z7902 Long term (current) use of antithrombotics/antiplatelets: Secondary | ICD-10-CM | POA: Diagnosis not present

## 2017-08-22 DIAGNOSIS — I10 Essential (primary) hypertension: Secondary | ICD-10-CM | POA: Diagnosis not present

## 2017-08-22 DIAGNOSIS — R0602 Shortness of breath: Secondary | ICD-10-CM | POA: Diagnosis not present

## 2017-08-22 DIAGNOSIS — G4733 Obstructive sleep apnea (adult) (pediatric): Secondary | ICD-10-CM | POA: Diagnosis not present

## 2017-08-22 DIAGNOSIS — I5043 Acute on chronic combined systolic (congestive) and diastolic (congestive) heart failure: Secondary | ICD-10-CM | POA: Diagnosis not present

## 2017-08-22 DIAGNOSIS — I739 Peripheral vascular disease, unspecified: Secondary | ICD-10-CM | POA: Diagnosis not present

## 2017-08-22 DIAGNOSIS — M6281 Muscle weakness (generalized): Secondary | ICD-10-CM | POA: Diagnosis not present

## 2017-08-22 DIAGNOSIS — I130 Hypertensive heart and chronic kidney disease with heart failure and stage 1 through stage 4 chronic kidney disease, or unspecified chronic kidney disease: Secondary | ICD-10-CM | POA: Diagnosis not present

## 2017-08-22 DIAGNOSIS — R278 Other lack of coordination: Secondary | ICD-10-CM | POA: Diagnosis not present

## 2017-08-22 DIAGNOSIS — Z79899 Other long term (current) drug therapy: Secondary | ICD-10-CM | POA: Diagnosis not present

## 2017-08-22 DIAGNOSIS — E785 Hyperlipidemia, unspecified: Secondary | ICD-10-CM | POA: Diagnosis not present

## 2017-08-22 DIAGNOSIS — E1122 Type 2 diabetes mellitus with diabetic chronic kidney disease: Secondary | ICD-10-CM | POA: Diagnosis not present

## 2017-08-22 DIAGNOSIS — R296 Repeated falls: Secondary | ICD-10-CM | POA: Diagnosis not present

## 2017-08-22 DIAGNOSIS — Z89511 Acquired absence of right leg below knee: Secondary | ICD-10-CM | POA: Diagnosis not present

## 2017-08-22 DIAGNOSIS — I5020 Unspecified systolic (congestive) heart failure: Secondary | ICD-10-CM | POA: Diagnosis not present

## 2017-08-22 LAB — URINE CULTURE

## 2017-08-22 LAB — GLUCOSE, CAPILLARY
GLUCOSE-CAPILLARY: 91 mg/dL (ref 65–99)
Glucose-Capillary: 217 mg/dL — ABNORMAL HIGH (ref 65–99)
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
## 2017-08-22 NOTE — Evaluation (Signed)
Physical Therapy Evaluation Patient Details Name: Mary [name] MRN: [id number] DOB: [birth year] Today's Date: 08/22/2017   History of Present Illness  Mary [name] is a 72 y.o. female with medical history significant for hypertension, type 2 diabetes mellitus, chronic diastolic CHF, chronic anemia, and history of osteomyelitis in the right foot status post right BKA, now presenting to the emergency department after multiple falls at home.  Patient reports that she has been generally weak, worsening over the past several months after she stopped physical therapy.  At baseline, she ambulates with a walker around her home, but reports that she has not been using the walker recently and has fallen multiple times.  Patient and her family have raised these concerns with her PCP who has involved social work.  Family reports that they were told it will take several days for nursing home placement, but they do not feel that she is safe at home with her multiple falls.  Patient denies any focal numbness or weakness, change in vision or hearing, chest pain, palpitations, fevers, chills, cough, or shortness of breath.  There has not been any abdominal pain, nausea, or vomiting.  She denies melena or hematochezia.    Clinical Impression  Patient limited to a few steps at bedside due to generalized weakness, poor standing balance and c/o fatigue.  Patient demonstrates good return for donning RLE BKA prosthetic leg and tolerated sitting up in chair with daughter in room after therapy.  Patient will benefit from continued physical therapy in hospital and recommended venue below to increase strength, balance, endurance for safe ADLs and gait.    Follow Up Recommendations SNF    Equipment Recommendations  None recommended by PT    Recommendations for Other Services       Precautions / Restrictions Precautions Precautions: Fall Restrictions Weight Bearing Restrictions: No      Mobility  Bed  Mobility Overal bed mobility: Needs Assistance Bed Mobility: Supine to Sit     Supine to sit: Min guard     General bed mobility comments: slow labored movement, head of bed raised  Transfers Overall transfer level: Needs assistance Equipment used: Rolling walker (2 wheeled) Transfers: Sit to/from [facility] Sit to Stand: Min assist Stand pivot transfers: Mod assist          Ambulation/Gait Ambulation/Gait assistance: Mod assist Ambulation Distance (Feet): 5 Feet Assistive device: Rolling walker (2 wheeled) Gait Pattern/deviations: Decreased step length - right;Decreased step length - left;Decreased stride length   Gait velocity interpretation: Below normal speed for age/gender General Gait Details: limited to a 6-7 steps at bedside due to BLE weakness and poor standing balance, good return for donning RLE BKA prosthetic leg  Stairs            Wheelchair Mobility    Modified Rankin (Stroke Patients Only)       Balance Overall balance assessment: Needs assistance Sitting-balance support: Feet supported;No upper extremity supported Sitting balance-Leahy Scale: Good     Standing balance support: Bilateral upper extremity supported;During functional activity Standing balance-Leahy Scale: Poor Standing balance comment: fair/poor with RW                             Pertinent Vitals/Pain Pain Assessment: 0-10 Pain Score: 2  Pain Location: right sided headache Pain Descriptors / Indicators: Aching Pain Intervention(s): Limited activity within patient's tolerance;Monitored during session    Home Living Family/patient expects to be discharged to:: Private  residence Living Arrangements: Spouse/significant other Available Help at Discharge: Family Type of Home: House Home Access: Level entry     [place]: One [place]: [place];Youth worker - 2 wheels;Walker - 4 wheels;Toilet riser Additional  Comments: Patient uses RLE BKA prosthetic leg    Prior Function Level of Independence: Independent with assistive device(s)         Comments: ambulates household distances with Rollator and RLE BKA prosthetic leg     Hand Dominance   Dominant Hand: Right    Extremity/Trunk Assessment   Upper Extremity Assessment Upper Extremity Assessment: Generalized weakness    Lower Extremity Assessment Lower Extremity Assessment: Generalized weakness    Cervical / Trunk Assessment Cervical / Trunk Assessment: Normal  Communication   Communication: No difficulties  Cognition Arousal/Alertness: Awake/alert Behavior During Therapy: WFL for tasks assessed/performed Overall Cognitive Status: Within Functional Limits for tasks assessed                                        General Comments      Exercises     Assessment/Plan    PT Assessment Patient needs continued PT services  PT Problem List Decreased strength;Decreased activity tolerance;Decreased balance;Decreased mobility       PT Treatment Interventions Gait training;Functional mobility training;Therapeutic activities;Therapeutic exercise;Patient/family education    PT Goals (Current goals can be found in the Care Plan section)  Acute Rehab PT Goals Patient Stated Goal: return home after rehab PT Goal Formulation: With patient/family Time For Goal Achievement: 09/08/17 Potential to Achieve Goals: Good    Frequency Min 3X/week   Barriers to discharge        Co-evaluation               AM-PAC PT "6 Clicks" Daily Activity  Outcome Measure Difficulty turning over in bed (including adjusting bedclothes, sheets and blankets)?: A Little Difficulty moving from lying on back to sitting on the side of the bed? : A Little Difficulty sitting down on and standing up from a chair with arms (e.g., wheelchair, bedside commode, etc,.)?: A Lot Help needed moving to and from a bed to chair (including a  wheelchair)?: A Lot Help needed walking in hospital room?: A Lot Help needed climbing 3-5 steps with a railing? : Total 6 Click Score: 13    End of Session Equipment Utilized During Treatment: Gait belt;Other (comment)(RLE BKA Prosthetic leg) Activity Tolerance: Patient tolerated treatment well;Patient limited by fatigue Patient left: in chair;with call bell/phone within reach;with family/visitor present Nurse Communication: Mobility status PT Visit Diagnosis: Unsteadiness on feet (R26.81);Other abnormalities of gait and mobility (R26.89);Muscle weakness (generalized) (M62.81)    Time: 6333-5456 PT Time Calculation (min) (ACUTE ONLY): 29 min   Charges:   PT Evaluation $PT Eval Moderate Complexity: 1 Mod     PT G Codes:        12:30 PM, 09/12/17 Lonell Grandchild, MPT Physical Therapist with [facility] [phone] office [phone] mobile phone

## 2017-08-22 NOTE — Clinical Social Work Placement (Signed)
   CLINICAL SOCIAL WORK PLACEMENT  NOTE  Date:  08/22/2017  Patient Details  Name: Mary [name] MRN: [id number] Date of Birth: 1945/06/16  Clinical Social Work is seeking post-discharge placement for this patient at the [place] level of care (*CSW will initial, date and re-position this form in  chart as items are completed):  Yes   Patient/family provided with [place] Work Department's list of facilities offering this level of care within the geographic area requested by the patient (or if unable, by the patient's family).  Yes   Patient/family informed of their freedom to choose among providers that offer the needed level of care, that participate in Medicare, Medicaid or managed care program needed by the patient, have an available bed and are willing to accept the patient.  Yes   Patient/family informed of [facility]'s ownership interest in [facility] and [facility], as well as of the fact that they are under no obligation to receive care at these facilities.  PASRR submitted to EDS on       PASRR number received on       Existing PASRR number confirmed on 08/22/17     FL2 transmitted to all facilities in geographic area requested by pt/family on 08/22/17     FL2 transmitted to all facilities within larger geographic area on       Patient informed that his/her managed care company has contracts with or will negotiate with certain facilities, including the following:        Yes   Patient/family informed of bed offers received.  Patient chooses bed at [facility]     Physician recommends and patient chooses bed at      Patient to be transferred to [facility] on 08/22/17.  Patient to be transferred to facility by daughter     Patient family notified on 08/22/17 of transfer.  Name of family member notified:  Robin     PHYSICIAN       Additional Comment: Plan is for dc today. Pt accepted at [facility] and  they will take her as soon as they have insurance auth. Dtr will transport. They will need help getting pt in and out of the car. LCSW, Heather, to update RN when pt can be discharged.   _______________________________________________ Shade Flood, LCSW 08/22/2017, 12:48 PM
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
## 2017-08-22 NOTE — Plan of Care (Signed)
  Problem: Acute Rehab PT Goals(only PT should resolve) Goal: Pt Will Go Supine/Side To Sit Outcome: Progressing Flowsheets (Taken 08/22/2017 1230) Pt will go Supine/Side to Sit: with supervision Goal: Patient Will Transfer Sit To/From Stand Outcome: Progressing Flowsheets (Taken 08/22/2017 1230) Patient will transfer sit to/from stand: with min guard assist Goal: Pt Will Transfer Bed To Chair/Chair To Bed Outcome: Progressing Flowsheets (Taken 08/22/2017 1230) Pt will Transfer Bed to Chair/Chair to Bed: min guard assist Goal: Pt Will Ambulate Outcome: Progressing Flowsheets (Taken 08/22/2017 1230) Pt will Ambulate: with minimal assist;with rolling walker;50 feet  12:31 PM, 08/22/17 Lonell Grandchild, MPT Physical Therapist with [facility] [phone] office [phone] mobile phone

## 2017-08-22 NOTE — NC FL2 (Signed)
[place] LEVEL OF CARE SCREENING TOOL     IDENTIFICATION  Patient Name: Mary [name] Birthdate: 10/31/1945 Sex: female Admission Date (Current Location): 08/20/2017  [facility] and [place] Number:  [employer] and Address:  [place] [address] [place]      Provider Number: [id number]  Attending Physician Name and Address:  Isaac Bliss, Olam Idler*  Relative Name and Phone Number:  Marcial Pacas [phone]    Current Level of Care: Hospital Recommended Level of Care: [place] Prior Approval Number: [id number]  Date Approved/Denied: 11/26/12 PASRR Number:    Discharge Plan: SNF    Current Diagnoses: Patient Active Problem List   Diagnosis Date Noted  . Multiple falls 08/20/2017  . Fall at home   . Pericardial effusion 12/15/2016  . Bilateral carotid bruits 12/15/2016  . Cardiomyopathy ([place])   . Left ventricular dysfunction   . Acute on chronic combined systolic and diastolic CHF (congestive heart failure) ([place])   . Right ventricular dysfunction   . CKD (chronic kidney disease), stage IV ([place]) 11/30/2016  . CHF exacerbation ([place]) 11/28/2016  . Fall 11/28/2016  . Acute on chronic diastolic CHF (congestive heart failure) ([place]) 11/28/2016  . Elevated troponin 07/19/2015  . Chronic diastolic CHF (congestive heart failure) ([place]) 01/13/2014  . Expressive aphasia 01/13/2014  . CHF (congestive heart failure) ([place])   . Unstable balance 01/16/2013  . Difficulty in walking(719.7) 01/16/2013  . Infected wound 01/16/2013  . Hx of right BKA ([place]) 12/23/2012  . Candidiasis of breast 12/08/2012  . S/P bilateral BKA (below knee amputation) ([place]) 11/30/2012  . Diabetes ([place]) 11/30/2012  . Diabetic foot ulcer ([place]) 10/08/2012  . Anxiety 02/17/2012  . Osteomyelitis of right foot ([place]) 02/14/2012  . Hematuria, microscopic 02/13/2012  . PVD (peripheral vascular disease) ([place]) 02/08/2012  . Open wound of  heel 02/08/2012  . Hyperlipidemia 11/11/2011  . Hypertension 11/11/2011  . Morbid obesity ([place]) 11/11/2011  . Tachycardia 08/04/2011  . Acute on chronic diastolic heart failure ([place]) 07/25/2011  . Diabetes mellitus with neuropathy 07/25/2011  . OSA (obstructive sleep apnea) 07/25/2011  . Physical debility 07/25/2011  . Acute respiratory failure ([place]) 07/25/2011  . Restless leg syndrome 07/25/2011  . Anemia 07/25/2011    Orientation RESPIRATION BLADDER Height & Weight     Self, Time, Situation, Place  Normal Continent Weight: 208 lb 5.4 oz (94.5 kg) Height:  5\' 10"  (177.8 cm)  BEHAVIORAL SYMPTOMS/MOOD NEUROLOGICAL BOWEL NUTRITION STATUS      Continent (heart healthy)  AMBULATORY STATUS COMMUNICATION OF NEEDS Skin   Extensive Assist Verbally Normal                       Personal Care Assistance Level of Assistance  Bathing, Feeding, Dressing Bathing Assistance: Limited assistance Feeding assistance: Independent Dressing Assistance: Limited assistance     Functional Limitations Info  Sight, Hearing, Speech Sight Info: Adequate Hearing Info: Adequate Speech Info: Adequate    SPECIAL CARE FACTORS FREQUENCY  PT (By licensed PT)     PT Frequency: 5 times week              Contractures Contractures Info: Not present    Additional Factors Info  Psychotropic, Insulin Sliding Scale Code Status Info: full Allergies Info: No Known Allergies Psychotropic Info: Valium, cymbalta, restoril Insulin Sliding Scale Info: see discharge summary       Current Medications (08/22/2017):  This is the current hospital active medication  list Current Facility-Administered Medications  Medication Dose Route Frequency Provider Last Rate Last Dose  . acetaminophen (TYLENOL) tablet 650 mg  650 mg Oral Q6H PRN Opyd, Ilene Qua, MD       Or  . acetaminophen (TYLENOL) suppository 650 mg  650 mg Rectal Q6H PRN Opyd, Ilene Qua, MD      . cholecalciferol (VITAMIN D) tablet 1,000 Units   1,000 Units Oral Daily Opyd, Ilene Qua, MD   1,000 Units at 08/22/17 1019  . cilostazol (PLETAL) tablet 100 mg  100 mg Oral Daily Opyd, Ilene Qua, MD   100 mg at 08/22/17 1019  . DULoxetine (CYMBALTA) DR capsule 60 mg  60 mg Oral Daily Opyd, Ilene Qua, MD   60 mg at 08/22/17 1019  . heparin injection 5,000 Units  5,000 Units Subcutaneous Q8H Opyd, Ilene Qua, MD   5,000 Units at 08/22/17 0554  . hydrALAZINE (APRESOLINE) tablet 37.5 mg  37.5 mg Oral TID Opyd, Ilene Qua, MD   37.5 mg at 08/22/17 1019  . insulin aspart (novoLOG) injection 0-5 Units  0-5 Units Subcutaneous QHS Opyd, Timothy S, MD      . insulin aspart (novoLOG) injection 0-9 Units  0-9 Units Subcutaneous TID WC Opyd, Ilene Qua, MD      . insulin glargine (LANTUS) injection 20 Units  20 Units Subcutaneous QHS Opyd, Ilene Qua, MD   Stopped at 08/21/17 2247  . metoprolol tartrate (LOPRESSOR) tablet 50 mg  50 mg Oral BID Opyd, Ilene Qua, MD   50 mg at 08/22/17 1019  . ondansetron (ZOFRAN) tablet 4 mg  4 mg Oral Q6H PRN Opyd, Ilene Qua, MD       Or  . ondansetron (ZOFRAN) injection 4 mg  4 mg Intravenous Q6H PRN Opyd, Ilene Qua, MD      . pravastatin (PRAVACHOL) tablet 40 mg  40 mg Oral q1800 Opyd, Ilene Qua, MD      . rOPINIRole (REQUIP) tablet 2 mg  2 mg Oral QHS Opyd, Ilene Qua, MD   2 mg at 08/20/17 2201  . senna-docusate (Senokot-S) tablet 1 tablet  1 tablet Oral QHS PRN Opyd, Ilene Qua, MD      . traMADol (ULTRAM) tablet 50-100 mg  50-100 mg Oral Q6H PRN Opyd, Ilene Qua, MD         Discharge Medications: Please see discharge summary for a list of discharge medications.  Relevant Imaging Results:  Relevant Lab Results:   Additional Information SSN: [place], [place]
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
## 2017-08-22 NOTE — Progress Notes (Signed)
IV discontinued,catheter intact. Discharge to [facility] called and given to [facility] LPN. Transported by Family to facility.Marland Kitchen

## 2017-08-23 DIAGNOSIS — E1140 Type 2 diabetes mellitus with diabetic neuropathy, unspecified: Secondary | ICD-10-CM | POA: Diagnosis not present

## 2017-08-23 DIAGNOSIS — R296 Repeated falls: Secondary | ICD-10-CM | POA: Diagnosis not present

## 2017-08-23 DIAGNOSIS — I10 Essential (primary) hypertension: Secondary | ICD-10-CM | POA: Diagnosis not present

## 2017-08-23 DIAGNOSIS — G4733 Obstructive sleep apnea (adult) (pediatric): Secondary | ICD-10-CM | POA: Diagnosis not present

## 2017-08-25 DIAGNOSIS — G4733 Obstructive sleep apnea (adult) (pediatric): Secondary | ICD-10-CM | POA: Diagnosis not present

## 2017-08-25 DIAGNOSIS — R296 Repeated falls: Secondary | ICD-10-CM | POA: Diagnosis not present

## 2017-08-25 DIAGNOSIS — E1140 Type 2 diabetes mellitus with diabetic neuropathy, unspecified: Secondary | ICD-10-CM | POA: Diagnosis not present

## 2017-08-25 DIAGNOSIS — E785 Hyperlipidemia, unspecified: Secondary | ICD-10-CM | POA: Diagnosis not present

## 2017-09-01 DIAGNOSIS — R296 Repeated falls: Secondary | ICD-10-CM | POA: Diagnosis not present

## 2017-09-01 DIAGNOSIS — I10 Essential (primary) hypertension: Secondary | ICD-10-CM | POA: Diagnosis not present

## 2017-09-05 DIAGNOSIS — I5020 Unspecified systolic (congestive) heart failure: Secondary | ICD-10-CM | POA: Diagnosis not present

## 2017-09-05 DIAGNOSIS — Z741 Need for assistance with personal care: Secondary | ICD-10-CM | POA: Diagnosis not present

## 2017-09-05 DIAGNOSIS — R278 Other lack of coordination: Secondary | ICD-10-CM | POA: Diagnosis not present

## 2017-09-05 DIAGNOSIS — M6281 Muscle weakness (generalized): Secondary | ICD-10-CM | POA: Diagnosis not present

## 2017-09-06 DIAGNOSIS — R278 Other lack of coordination: Secondary | ICD-10-CM | POA: Diagnosis not present

## 2017-09-06 DIAGNOSIS — M6281 Muscle weakness (generalized): Secondary | ICD-10-CM | POA: Diagnosis not present

## 2017-09-06 DIAGNOSIS — I5020 Unspecified systolic (congestive) heart failure: Secondary | ICD-10-CM | POA: Diagnosis not present

## 2017-09-06 DIAGNOSIS — Z741 Need for assistance with personal care: Secondary | ICD-10-CM | POA: Diagnosis not present

## 2017-09-07 DIAGNOSIS — D649 Anemia, unspecified: Secondary | ICD-10-CM | POA: Diagnosis not present

## 2017-09-07 DIAGNOSIS — I5033 Acute on chronic diastolic (congestive) heart failure: Secondary | ICD-10-CM | POA: Diagnosis not present

## 2017-09-07 DIAGNOSIS — N184 Chronic kidney disease, stage 4 (severe): Secondary | ICD-10-CM | POA: Diagnosis not present

## 2017-09-07 DIAGNOSIS — N183 Chronic kidney disease, stage 3 (moderate): Secondary | ICD-10-CM | POA: Diagnosis not present

## 2017-09-09 DIAGNOSIS — I5020 Unspecified systolic (congestive) heart failure: Secondary | ICD-10-CM | POA: Diagnosis not present

## 2017-09-09 DIAGNOSIS — R278 Other lack of coordination: Secondary | ICD-10-CM | POA: Diagnosis not present

## 2017-09-09 DIAGNOSIS — Z741 Need for assistance with personal care: Secondary | ICD-10-CM | POA: Diagnosis not present

## 2017-09-09 DIAGNOSIS — M6281 Muscle weakness (generalized): Secondary | ICD-10-CM | POA: Diagnosis not present

## 2017-09-12 DIAGNOSIS — M25562 Pain in left knee: Secondary | ICD-10-CM | POA: Diagnosis not present

## 2017-09-12 DIAGNOSIS — Z741 Need for assistance with personal care: Secondary | ICD-10-CM | POA: Diagnosis not present

## 2017-09-12 DIAGNOSIS — R278 Other lack of coordination: Secondary | ICD-10-CM | POA: Diagnosis not present

## 2017-09-12 DIAGNOSIS — M1712 Unilateral primary osteoarthritis, left knee: Secondary | ICD-10-CM | POA: Diagnosis not present

## 2017-09-12 DIAGNOSIS — I5020 Unspecified systolic (congestive) heart failure: Secondary | ICD-10-CM | POA: Diagnosis not present

## 2017-09-12 DIAGNOSIS — I10 Essential (primary) hypertension: Secondary | ICD-10-CM | POA: Diagnosis not present

## 2017-09-12 DIAGNOSIS — I5033 Acute on chronic diastolic (congestive) heart failure: Secondary | ICD-10-CM | POA: Diagnosis not present

## 2017-09-12 DIAGNOSIS — M6281 Muscle weakness (generalized): Secondary | ICD-10-CM | POA: Diagnosis not present

## 2017-09-15 DIAGNOSIS — R278 Other lack of coordination: Secondary | ICD-10-CM | POA: Diagnosis not present

## 2017-09-15 DIAGNOSIS — Z741 Need for assistance with personal care: Secondary | ICD-10-CM | POA: Diagnosis not present

## 2017-09-15 DIAGNOSIS — I5020 Unspecified systolic (congestive) heart failure: Secondary | ICD-10-CM | POA: Diagnosis not present

## 2017-09-15 DIAGNOSIS — M6281 Muscle weakness (generalized): Secondary | ICD-10-CM | POA: Diagnosis not present

## 2017-09-16 DIAGNOSIS — R278 Other lack of coordination: Secondary | ICD-10-CM | POA: Diagnosis not present

## 2017-09-16 DIAGNOSIS — M6281 Muscle weakness (generalized): Secondary | ICD-10-CM | POA: Diagnosis not present

## 2017-09-16 DIAGNOSIS — Z741 Need for assistance with personal care: Secondary | ICD-10-CM | POA: Diagnosis not present

## 2017-09-16 DIAGNOSIS — D649 Anemia, unspecified: Secondary | ICD-10-CM | POA: Diagnosis not present

## 2017-09-16 DIAGNOSIS — I10 Essential (primary) hypertension: Secondary | ICD-10-CM | POA: Diagnosis not present

## 2017-09-16 DIAGNOSIS — I5020 Unspecified systolic (congestive) heart failure: Secondary | ICD-10-CM | POA: Diagnosis not present

## 2017-09-17 DIAGNOSIS — D649 Anemia, unspecified: Secondary | ICD-10-CM | POA: Diagnosis not present

## 2017-09-17 DIAGNOSIS — I10 Essential (primary) hypertension: Secondary | ICD-10-CM | POA: Diagnosis not present

## 2017-09-19 DIAGNOSIS — E1122 Type 2 diabetes mellitus with diabetic chronic kidney disease: Secondary | ICD-10-CM | POA: Diagnosis not present

## 2017-09-19 DIAGNOSIS — E1140 Type 2 diabetes mellitus with diabetic neuropathy, unspecified: Secondary | ICD-10-CM | POA: Diagnosis not present

## 2017-09-19 DIAGNOSIS — G4701 Insomnia due to medical condition: Secondary | ICD-10-CM | POA: Diagnosis not present

## 2017-09-19 DIAGNOSIS — Z741 Need for assistance with personal care: Secondary | ICD-10-CM | POA: Diagnosis not present

## 2017-09-19 DIAGNOSIS — I5020 Unspecified systolic (congestive) heart failure: Secondary | ICD-10-CM | POA: Diagnosis not present

## 2017-09-19 DIAGNOSIS — M6281 Muscle weakness (generalized): Secondary | ICD-10-CM | POA: Diagnosis not present

## 2017-09-19 DIAGNOSIS — R278 Other lack of coordination: Secondary | ICD-10-CM | POA: Diagnosis not present

## 2017-09-19 DIAGNOSIS — I10 Essential (primary) hypertension: Secondary | ICD-10-CM | POA: Diagnosis not present

## 2017-09-19 DIAGNOSIS — I5043 Acute on chronic combined systolic (congestive) and diastolic (congestive) heart failure: Secondary | ICD-10-CM | POA: Diagnosis not present

## 2017-09-19 DIAGNOSIS — N183 Chronic kidney disease, stage 3 (moderate): Secondary | ICD-10-CM | POA: Diagnosis not present

## 2017-09-21 DIAGNOSIS — N183 Chronic kidney disease, stage 3 (moderate): Secondary | ICD-10-CM | POA: Diagnosis not present

## 2017-09-21 DIAGNOSIS — G2581 Restless legs syndrome: Secondary | ICD-10-CM | POA: Diagnosis not present

## 2017-09-21 DIAGNOSIS — Z741 Need for assistance with personal care: Secondary | ICD-10-CM | POA: Diagnosis not present

## 2017-09-21 DIAGNOSIS — M6281 Muscle weakness (generalized): Secondary | ICD-10-CM | POA: Diagnosis not present

## 2017-09-21 DIAGNOSIS — R278 Other lack of coordination: Secondary | ICD-10-CM | POA: Diagnosis not present

## 2017-09-21 DIAGNOSIS — I5020 Unspecified systolic (congestive) heart failure: Secondary | ICD-10-CM | POA: Diagnosis not present

## 2017-09-22 DIAGNOSIS — I4891 Unspecified atrial fibrillation: Secondary | ICD-10-CM | POA: Diagnosis not present

## 2017-09-22 DIAGNOSIS — D649 Anemia, unspecified: Secondary | ICD-10-CM | POA: Diagnosis not present

## 2017-09-22 DIAGNOSIS — E559 Vitamin D deficiency, unspecified: Secondary | ICD-10-CM | POA: Diagnosis not present

## 2017-09-22 DIAGNOSIS — I10 Essential (primary) hypertension: Secondary | ICD-10-CM | POA: Diagnosis not present

## 2017-09-22 DIAGNOSIS — Z79899 Other long term (current) drug therapy: Secondary | ICD-10-CM | POA: Diagnosis not present

## 2017-09-22 DIAGNOSIS — R809 Proteinuria, unspecified: Secondary | ICD-10-CM | POA: Diagnosis not present

## 2017-09-22 DIAGNOSIS — D519 Vitamin B12 deficiency anemia, unspecified: Secondary | ICD-10-CM | POA: Diagnosis not present

## 2017-09-22 DIAGNOSIS — N183 Chronic kidney disease, stage 3 (moderate): Secondary | ICD-10-CM | POA: Diagnosis not present

## 2017-09-23 DIAGNOSIS — M6281 Muscle weakness (generalized): Secondary | ICD-10-CM | POA: Diagnosis not present

## 2017-09-23 DIAGNOSIS — Z741 Need for assistance with personal care: Secondary | ICD-10-CM | POA: Diagnosis not present

## 2017-09-23 DIAGNOSIS — I5020 Unspecified systolic (congestive) heart failure: Secondary | ICD-10-CM | POA: Diagnosis not present

## 2017-09-23 DIAGNOSIS — I10 Essential (primary) hypertension: Secondary | ICD-10-CM | POA: Diagnosis not present

## 2017-09-23 DIAGNOSIS — R278 Other lack of coordination: Secondary | ICD-10-CM | POA: Diagnosis not present

## 2017-09-26 DIAGNOSIS — I10 Essential (primary) hypertension: Secondary | ICD-10-CM | POA: Diagnosis not present

## 2017-09-26 DIAGNOSIS — G2581 Restless legs syndrome: Secondary | ICD-10-CM | POA: Diagnosis not present

## 2017-09-26 DIAGNOSIS — E1140 Type 2 diabetes mellitus with diabetic neuropathy, unspecified: Secondary | ICD-10-CM | POA: Diagnosis not present

## 2017-09-29 DIAGNOSIS — G2581 Restless legs syndrome: Secondary | ICD-10-CM | POA: Diagnosis not present

## 2017-09-30 DIAGNOSIS — Z1159 Encounter for screening for other viral diseases: Secondary | ICD-10-CM | POA: Diagnosis not present

## 2017-10-03 DIAGNOSIS — G2581 Restless legs syndrome: Secondary | ICD-10-CM | POA: Diagnosis not present

## 2017-10-03 DIAGNOSIS — I10 Essential (primary) hypertension: Secondary | ICD-10-CM | POA: Diagnosis not present

## 2017-10-03 DIAGNOSIS — E1140 Type 2 diabetes mellitus with diabetic neuropathy, unspecified: Secondary | ICD-10-CM | POA: Diagnosis not present

## 2017-10-06 DIAGNOSIS — Z1159 Encounter for screening for other viral diseases: Secondary | ICD-10-CM | POA: Diagnosis not present

## 2017-10-11 DIAGNOSIS — S82142A Displaced bicondylar fracture of left tibia, initial encounter for closed fracture: Secondary | ICD-10-CM | POA: Diagnosis not present

## 2017-10-11 DIAGNOSIS — M1712 Unilateral primary osteoarthritis, left knee: Secondary | ICD-10-CM | POA: Diagnosis not present

## 2017-10-11 DIAGNOSIS — M25462 Effusion, left knee: Secondary | ICD-10-CM | POA: Diagnosis not present

## 2017-10-13 DIAGNOSIS — S82142A Displaced bicondylar fracture of left tibia, initial encounter for closed fracture: Secondary | ICD-10-CM | POA: Diagnosis not present

## 2017-10-17 DIAGNOSIS — I10 Essential (primary) hypertension: Secondary | ICD-10-CM | POA: Diagnosis not present

## 2017-10-17 DIAGNOSIS — D649 Anemia, unspecified: Secondary | ICD-10-CM | POA: Diagnosis not present

## 2017-10-17 DIAGNOSIS — G4733 Obstructive sleep apnea (adult) (pediatric): Secondary | ICD-10-CM | POA: Diagnosis not present

## 2017-10-17 DIAGNOSIS — E785 Hyperlipidemia, unspecified: Secondary | ICD-10-CM | POA: Diagnosis not present

## 2017-10-17 DIAGNOSIS — E1140 Type 2 diabetes mellitus with diabetic neuropathy, unspecified: Secondary | ICD-10-CM | POA: Diagnosis not present

## 2017-10-18 DIAGNOSIS — D649 Anemia, unspecified: Secondary | ICD-10-CM | POA: Diagnosis not present

## 2017-10-18 DIAGNOSIS — I509 Heart failure, unspecified: Secondary | ICD-10-CM | POA: Diagnosis not present

## 2017-10-18 DIAGNOSIS — I10 Essential (primary) hypertension: Secondary | ICD-10-CM | POA: Diagnosis not present

## 2017-10-18 DIAGNOSIS — N184 Chronic kidney disease, stage 4 (severe): Secondary | ICD-10-CM | POA: Diagnosis not present

## 2017-10-18 DIAGNOSIS — E1121 Type 2 diabetes mellitus with diabetic nephropathy: Secondary | ICD-10-CM | POA: Diagnosis not present

## 2017-10-26 DIAGNOSIS — S098XXA Other specified injuries of head, initial encounter: Secondary | ICD-10-CM | POA: Diagnosis not present

## 2017-10-26 DIAGNOSIS — S0990XA Unspecified injury of head, initial encounter: Secondary | ICD-10-CM | POA: Diagnosis not present

## 2017-10-26 DIAGNOSIS — R609 Edema, unspecified: Secondary | ICD-10-CM | POA: Diagnosis not present

## 2017-10-26 DIAGNOSIS — E119 Type 2 diabetes mellitus without complications: Secondary | ICD-10-CM | POA: Diagnosis not present

## 2017-10-26 DIAGNOSIS — W01198A Fall on same level from slipping, tripping and stumbling with subsequent striking against other object, initial encounter: Secondary | ICD-10-CM | POA: Diagnosis not present

## 2017-10-26 DIAGNOSIS — G2581 Restless legs syndrome: Secondary | ICD-10-CM | POA: Diagnosis not present

## 2017-10-26 DIAGNOSIS — M25511 Pain in right shoulder: Secondary | ICD-10-CM | POA: Diagnosis not present

## 2017-10-26 DIAGNOSIS — S199XXA Unspecified injury of neck, initial encounter: Secondary | ICD-10-CM | POA: Diagnosis not present

## 2017-10-26 DIAGNOSIS — I10 Essential (primary) hypertension: Secondary | ICD-10-CM | POA: Diagnosis not present

## 2017-10-26 DIAGNOSIS — S0591XA Unspecified injury of right eye and orbit, initial encounter: Secondary | ICD-10-CM | POA: Diagnosis not present

## 2017-10-26 DIAGNOSIS — R51 Headache: Secondary | ICD-10-CM | POA: Diagnosis not present

## 2017-10-26 DIAGNOSIS — S0011XA Contusion of right eyelid and periocular area, initial encounter: Secondary | ICD-10-CM | POA: Diagnosis not present

## 2017-10-26 DIAGNOSIS — S0093XA Contusion of unspecified part of head, initial encounter: Secondary | ICD-10-CM | POA: Diagnosis not present

## 2017-10-26 DIAGNOSIS — S4991XA Unspecified injury of right shoulder and upper arm, initial encounter: Secondary | ICD-10-CM | POA: Diagnosis not present

## 2017-10-26 DIAGNOSIS — S0083XA Contusion of other part of head, initial encounter: Secondary | ICD-10-CM | POA: Diagnosis not present

## 2017-10-26 DIAGNOSIS — M542 Cervicalgia: Secondary | ICD-10-CM | POA: Diagnosis not present

## 2017-10-26 DIAGNOSIS — Z79899 Other long term (current) drug therapy: Secondary | ICD-10-CM | POA: Diagnosis not present

## 2017-10-26 DIAGNOSIS — R220 Localized swelling, mass and lump, head: Secondary | ICD-10-CM | POA: Diagnosis not present

## 2017-10-26 DIAGNOSIS — R296 Repeated falls: Secondary | ICD-10-CM | POA: Diagnosis not present

## 2017-10-27 DIAGNOSIS — R296 Repeated falls: Secondary | ICD-10-CM | POA: Diagnosis not present

## 2017-10-27 DIAGNOSIS — S0591XD Unspecified injury of right eye and orbit, subsequent encounter: Secondary | ICD-10-CM | POA: Diagnosis not present

## 2017-11-09 DIAGNOSIS — G4733 Obstructive sleep apnea (adult) (pediatric): Secondary | ICD-10-CM | POA: Diagnosis not present

## 2017-11-09 DIAGNOSIS — E1140 Type 2 diabetes mellitus with diabetic neuropathy, unspecified: Secondary | ICD-10-CM | POA: Diagnosis not present

## 2017-11-09 DIAGNOSIS — I10 Essential (primary) hypertension: Secondary | ICD-10-CM | POA: Diagnosis not present

## 2017-11-09 DIAGNOSIS — E785 Hyperlipidemia, unspecified: Secondary | ICD-10-CM | POA: Diagnosis not present

## 2017-11-11 DIAGNOSIS — E11610 Type 2 diabetes mellitus with diabetic neuropathic arthropathy: Secondary | ICD-10-CM | POA: Diagnosis not present

## 2017-11-11 DIAGNOSIS — M14672 Charcot's joint, left ankle and foot: Secondary | ICD-10-CM | POA: Diagnosis not present

## 2017-11-14 DIAGNOSIS — S82142A Displaced bicondylar fracture of left tibia, initial encounter for closed fracture: Secondary | ICD-10-CM | POA: Diagnosis not present

## 2017-11-14 DIAGNOSIS — M25562 Pain in left knee: Secondary | ICD-10-CM | POA: Diagnosis not present

## 2017-11-14 DIAGNOSIS — M1712 Unilateral primary osteoarthritis, left knee: Secondary | ICD-10-CM | POA: Diagnosis not present

## 2017-11-14 DIAGNOSIS — S82122D Displaced fracture of lateral condyle of left tibia, subsequent encounter for closed fracture with routine healing: Secondary | ICD-10-CM | POA: Diagnosis not present

## 2017-11-16 DIAGNOSIS — N183 Chronic kidney disease, stage 3 (moderate): Secondary | ICD-10-CM | POA: Diagnosis not present

## 2017-11-16 DIAGNOSIS — E1140 Type 2 diabetes mellitus with diabetic neuropathy, unspecified: Secondary | ICD-10-CM | POA: Diagnosis not present

## 2017-11-21 DIAGNOSIS — M6281 Muscle weakness (generalized): Secondary | ICD-10-CM | POA: Diagnosis not present

## 2017-11-21 DIAGNOSIS — R278 Other lack of coordination: Secondary | ICD-10-CM | POA: Diagnosis not present

## 2017-11-21 DIAGNOSIS — Z741 Need for assistance with personal care: Secondary | ICD-10-CM | POA: Diagnosis not present

## 2017-11-21 DIAGNOSIS — I5020 Unspecified systolic (congestive) heart failure: Secondary | ICD-10-CM | POA: Diagnosis not present

## 2017-11-24 DIAGNOSIS — R278 Other lack of coordination: Secondary | ICD-10-CM | POA: Diagnosis not present

## 2017-11-24 DIAGNOSIS — Z741 Need for assistance with personal care: Secondary | ICD-10-CM | POA: Diagnosis not present

## 2017-11-24 DIAGNOSIS — I5020 Unspecified systolic (congestive) heart failure: Secondary | ICD-10-CM | POA: Diagnosis not present

## 2017-11-24 DIAGNOSIS — M6281 Muscle weakness (generalized): Secondary | ICD-10-CM | POA: Diagnosis not present

## 2017-11-30 DIAGNOSIS — J069 Acute upper respiratory infection, unspecified: Secondary | ICD-10-CM | POA: Diagnosis not present

## 2017-12-05 DIAGNOSIS — G4733 Obstructive sleep apnea (adult) (pediatric): Secondary | ICD-10-CM | POA: Diagnosis not present

## 2017-12-05 DIAGNOSIS — I10 Essential (primary) hypertension: Secondary | ICD-10-CM | POA: Diagnosis not present

## 2017-12-05 DIAGNOSIS — E1122 Type 2 diabetes mellitus with diabetic chronic kidney disease: Secondary | ICD-10-CM | POA: Diagnosis not present

## 2017-12-05 DIAGNOSIS — I5020 Unspecified systolic (congestive) heart failure: Secondary | ICD-10-CM | POA: Diagnosis not present

## 2017-12-07 DIAGNOSIS — J0100 Acute maxillary sinusitis, unspecified: Secondary | ICD-10-CM | POA: Diagnosis not present

## 2017-12-09 ENCOUNTER — Other Ambulatory Visit: Payer: Self-pay

## 2017-12-09 NOTE — Patient Outreach (Signed)
[facility]) Care Management  12/09/2017  ROSY ESTABROOK June 11, 1945 [id number]   Medication Adherence call to Mrs. Virl Cagey patient's telephone number is disconnected we need a new telephone number for patient. patient is due on Pravastatin 40 mg. Mrs. Fulmore is showing past due under [facility] ins.  [facility] Management Direct Dial [phone]  Fax [phone] [email or web address]

## 2017-12-14 DIAGNOSIS — M62838 Other muscle spasm: Secondary | ICD-10-CM | POA: Diagnosis not present

## 2017-12-26 DIAGNOSIS — M62838 Other muscle spasm: Secondary | ICD-10-CM | POA: Diagnosis not present

## 2017-12-26 DIAGNOSIS — G2581 Restless legs syndrome: Secondary | ICD-10-CM | POA: Diagnosis not present

## 2017-12-27 DIAGNOSIS — E559 Vitamin D deficiency, unspecified: Secondary | ICD-10-CM | POA: Diagnosis not present

## 2017-12-27 DIAGNOSIS — D649 Anemia, unspecified: Secondary | ICD-10-CM | POA: Diagnosis not present

## 2017-12-27 DIAGNOSIS — R5383 Other fatigue: Secondary | ICD-10-CM | POA: Diagnosis not present

## 2017-12-27 DIAGNOSIS — R944 Abnormal results of kidney function studies: Secondary | ICD-10-CM | POA: Diagnosis not present

## 2017-12-29 DIAGNOSIS — Z1159 Encounter for screening for other viral diseases: Secondary | ICD-10-CM | POA: Diagnosis not present

## 2017-12-29 DIAGNOSIS — R944 Abnormal results of kidney function studies: Secondary | ICD-10-CM | POA: Diagnosis not present

## 2017-12-29 DIAGNOSIS — N184 Chronic kidney disease, stage 4 (severe): Secondary | ICD-10-CM | POA: Diagnosis not present

## 2018-01-02 DIAGNOSIS — G4733 Obstructive sleep apnea (adult) (pediatric): Secondary | ICD-10-CM | POA: Diagnosis not present

## 2018-01-02 DIAGNOSIS — G2581 Restless legs syndrome: Secondary | ICD-10-CM | POA: Diagnosis not present

## 2018-01-02 DIAGNOSIS — M25562 Pain in left knee: Secondary | ICD-10-CM | POA: Diagnosis not present

## 2018-01-02 DIAGNOSIS — E1140 Type 2 diabetes mellitus with diabetic neuropathy, unspecified: Secondary | ICD-10-CM | POA: Diagnosis not present

## 2018-01-03 DIAGNOSIS — I10 Essential (primary) hypertension: Secondary | ICD-10-CM | POA: Diagnosis not present

## 2018-01-03 DIAGNOSIS — I509 Heart failure, unspecified: Secondary | ICD-10-CM | POA: Diagnosis not present

## 2018-01-03 DIAGNOSIS — N184 Chronic kidney disease, stage 4 (severe): Secondary | ICD-10-CM | POA: Diagnosis not present

## 2018-01-04 DIAGNOSIS — D649 Anemia, unspecified: Secondary | ICD-10-CM | POA: Diagnosis not present

## 2018-01-12 DIAGNOSIS — M62838 Other muscle spasm: Secondary | ICD-10-CM | POA: Diagnosis not present

## 2018-01-12 DIAGNOSIS — G2581 Restless legs syndrome: Secondary | ICD-10-CM | POA: Diagnosis not present

## 2018-01-19 DIAGNOSIS — D649 Anemia, unspecified: Secondary | ICD-10-CM | POA: Diagnosis not present

## 2018-01-26 DIAGNOSIS — I10 Essential (primary) hypertension: Secondary | ICD-10-CM | POA: Diagnosis not present

## 2018-01-26 DIAGNOSIS — N183 Chronic kidney disease, stage 3 (moderate): Secondary | ICD-10-CM | POA: Diagnosis not present

## 2018-01-26 DIAGNOSIS — G2581 Restless legs syndrome: Secondary | ICD-10-CM | POA: Diagnosis not present

## 2018-01-26 DIAGNOSIS — I5020 Unspecified systolic (congestive) heart failure: Secondary | ICD-10-CM | POA: Diagnosis not present

## 2018-02-03 DIAGNOSIS — D649 Anemia, unspecified: Secondary | ICD-10-CM | POA: Diagnosis not present

## 2018-02-06 DIAGNOSIS — Z89511 Acquired absence of right leg below knee: Secondary | ICD-10-CM | POA: Diagnosis not present

## 2018-02-10 DIAGNOSIS — E119 Type 2 diabetes mellitus without complications: Secondary | ICD-10-CM | POA: Diagnosis not present

## 2018-02-10 DIAGNOSIS — D649 Anemia, unspecified: Secondary | ICD-10-CM | POA: Diagnosis not present

## 2018-02-10 DIAGNOSIS — E785 Hyperlipidemia, unspecified: Secondary | ICD-10-CM | POA: Diagnosis not present

## 2018-02-10 DIAGNOSIS — D519 Vitamin B12 deficiency anemia, unspecified: Secondary | ICD-10-CM | POA: Diagnosis not present

## 2018-02-10 DIAGNOSIS — I5020 Unspecified systolic (congestive) heart failure: Secondary | ICD-10-CM | POA: Diagnosis not present

## 2018-02-10 DIAGNOSIS — I10 Essential (primary) hypertension: Secondary | ICD-10-CM | POA: Diagnosis not present

## 2018-02-10 DIAGNOSIS — Z741 Need for assistance with personal care: Secondary | ICD-10-CM | POA: Diagnosis not present

## 2018-02-10 DIAGNOSIS — R278 Other lack of coordination: Secondary | ICD-10-CM | POA: Diagnosis not present

## 2018-02-10 DIAGNOSIS — Z79899 Other long term (current) drug therapy: Secondary | ICD-10-CM | POA: Diagnosis not present

## 2018-02-10 DIAGNOSIS — M6281 Muscle weakness (generalized): Secondary | ICD-10-CM | POA: Diagnosis not present

## 2018-02-15 DIAGNOSIS — I5020 Unspecified systolic (congestive) heart failure: Secondary | ICD-10-CM | POA: Diagnosis not present

## 2018-02-15 DIAGNOSIS — Z741 Need for assistance with personal care: Secondary | ICD-10-CM | POA: Diagnosis not present

## 2018-02-15 DIAGNOSIS — R278 Other lack of coordination: Secondary | ICD-10-CM | POA: Diagnosis not present

## 2018-02-15 DIAGNOSIS — M6281 Muscle weakness (generalized): Secondary | ICD-10-CM | POA: Diagnosis not present

## 2018-02-16 DIAGNOSIS — D649 Anemia, unspecified: Secondary | ICD-10-CM | POA: Diagnosis not present

## 2018-02-17 DIAGNOSIS — M6281 Muscle weakness (generalized): Secondary | ICD-10-CM | POA: Diagnosis not present

## 2018-02-17 DIAGNOSIS — R278 Other lack of coordination: Secondary | ICD-10-CM | POA: Diagnosis not present

## 2018-02-17 DIAGNOSIS — Z741 Need for assistance with personal care: Secondary | ICD-10-CM | POA: Diagnosis not present

## 2018-02-17 DIAGNOSIS — I5020 Unspecified systolic (congestive) heart failure: Secondary | ICD-10-CM | POA: Diagnosis not present

## 2018-02-21 DIAGNOSIS — I5020 Unspecified systolic (congestive) heart failure: Secondary | ICD-10-CM | POA: Diagnosis not present

## 2018-02-21 DIAGNOSIS — R278 Other lack of coordination: Secondary | ICD-10-CM | POA: Diagnosis not present

## 2018-02-21 DIAGNOSIS — M6281 Muscle weakness (generalized): Secondary | ICD-10-CM | POA: Diagnosis not present

## 2018-02-21 DIAGNOSIS — Z741 Need for assistance with personal care: Secondary | ICD-10-CM | POA: Diagnosis not present

## 2018-02-23 DIAGNOSIS — E1140 Type 2 diabetes mellitus with diabetic neuropathy, unspecified: Secondary | ICD-10-CM | POA: Diagnosis not present

## 2018-02-23 DIAGNOSIS — G4733 Obstructive sleep apnea (adult) (pediatric): Secondary | ICD-10-CM | POA: Diagnosis not present

## 2018-02-23 DIAGNOSIS — I10 Essential (primary) hypertension: Secondary | ICD-10-CM | POA: Diagnosis not present

## 2018-02-23 DIAGNOSIS — I5020 Unspecified systolic (congestive) heart failure: Secondary | ICD-10-CM | POA: Diagnosis not present

## 2018-02-24 DIAGNOSIS — Z741 Need for assistance with personal care: Secondary | ICD-10-CM | POA: Diagnosis not present

## 2018-02-24 DIAGNOSIS — R278 Other lack of coordination: Secondary | ICD-10-CM | POA: Diagnosis not present

## 2018-02-24 DIAGNOSIS — M6281 Muscle weakness (generalized): Secondary | ICD-10-CM | POA: Diagnosis not present

## 2018-02-24 DIAGNOSIS — I5020 Unspecified systolic (congestive) heart failure: Secondary | ICD-10-CM | POA: Diagnosis not present

## 2018-02-27 DIAGNOSIS — Z741 Need for assistance with personal care: Secondary | ICD-10-CM | POA: Diagnosis not present

## 2018-02-27 DIAGNOSIS — M6281 Muscle weakness (generalized): Secondary | ICD-10-CM | POA: Diagnosis not present

## 2018-02-27 DIAGNOSIS — I5020 Unspecified systolic (congestive) heart failure: Secondary | ICD-10-CM | POA: Diagnosis not present

## 2018-02-27 DIAGNOSIS — R278 Other lack of coordination: Secondary | ICD-10-CM | POA: Diagnosis not present

## 2018-02-27 DIAGNOSIS — R296 Repeated falls: Secondary | ICD-10-CM | POA: Diagnosis not present

## 2018-02-28 DIAGNOSIS — I5020 Unspecified systolic (congestive) heart failure: Secondary | ICD-10-CM | POA: Diagnosis not present

## 2018-02-28 DIAGNOSIS — R278 Other lack of coordination: Secondary | ICD-10-CM | POA: Diagnosis not present

## 2018-02-28 DIAGNOSIS — Z741 Need for assistance with personal care: Secondary | ICD-10-CM | POA: Diagnosis not present

## 2018-02-28 DIAGNOSIS — M6281 Muscle weakness (generalized): Secondary | ICD-10-CM | POA: Diagnosis not present

## 2018-03-01 DIAGNOSIS — Z741 Need for assistance with personal care: Secondary | ICD-10-CM | POA: Diagnosis not present

## 2018-03-01 DIAGNOSIS — R278 Other lack of coordination: Secondary | ICD-10-CM | POA: Diagnosis not present

## 2018-03-01 DIAGNOSIS — B351 Tinea unguium: Secondary | ICD-10-CM | POA: Diagnosis not present

## 2018-03-01 DIAGNOSIS — D649 Anemia, unspecified: Secondary | ICD-10-CM | POA: Diagnosis not present

## 2018-03-01 DIAGNOSIS — M6281 Muscle weakness (generalized): Secondary | ICD-10-CM | POA: Diagnosis not present

## 2018-03-01 DIAGNOSIS — M2042 Other hammer toe(s) (acquired), left foot: Secondary | ICD-10-CM | POA: Diagnosis not present

## 2018-03-01 DIAGNOSIS — I5020 Unspecified systolic (congestive) heart failure: Secondary | ICD-10-CM | POA: Diagnosis not present

## 2018-03-06 DIAGNOSIS — I5020 Unspecified systolic (congestive) heart failure: Secondary | ICD-10-CM | POA: Diagnosis not present

## 2018-03-06 DIAGNOSIS — M6281 Muscle weakness (generalized): Secondary | ICD-10-CM | POA: Diagnosis not present

## 2018-03-06 DIAGNOSIS — Z741 Need for assistance with personal care: Secondary | ICD-10-CM | POA: Diagnosis not present

## 2018-03-06 DIAGNOSIS — R5383 Other fatigue: Secondary | ICD-10-CM | POA: Diagnosis not present

## 2018-03-06 DIAGNOSIS — D649 Anemia, unspecified: Secondary | ICD-10-CM | POA: Diagnosis not present

## 2018-03-06 DIAGNOSIS — E559 Vitamin D deficiency, unspecified: Secondary | ICD-10-CM | POA: Diagnosis not present

## 2018-03-06 DIAGNOSIS — R7989 Other specified abnormal findings of blood chemistry: Secondary | ICD-10-CM | POA: Diagnosis not present

## 2018-03-06 DIAGNOSIS — R278 Other lack of coordination: Secondary | ICD-10-CM | POA: Diagnosis not present

## 2018-03-08 DIAGNOSIS — R296 Repeated falls: Secondary | ICD-10-CM | POA: Diagnosis not present

## 2018-03-08 DIAGNOSIS — E1140 Type 2 diabetes mellitus with diabetic neuropathy, unspecified: Secondary | ICD-10-CM | POA: Diagnosis not present

## 2018-03-08 DIAGNOSIS — G2581 Restless legs syndrome: Secondary | ICD-10-CM | POA: Diagnosis not present

## 2018-03-14 DIAGNOSIS — N185 Chronic kidney disease, stage 5: Secondary | ICD-10-CM | POA: Diagnosis not present

## 2018-03-14 DIAGNOSIS — I509 Heart failure, unspecified: Secondary | ICD-10-CM | POA: Diagnosis not present

## 2018-03-14 DIAGNOSIS — E1129 Type 2 diabetes mellitus with other diabetic kidney complication: Secondary | ICD-10-CM | POA: Diagnosis not present

## 2018-03-14 DIAGNOSIS — E875 Hyperkalemia: Secondary | ICD-10-CM | POA: Diagnosis not present

## 2018-03-14 DIAGNOSIS — I10 Essential (primary) hypertension: Secondary | ICD-10-CM | POA: Diagnosis not present

## 2018-03-16 DIAGNOSIS — D649 Anemia, unspecified: Secondary | ICD-10-CM | POA: Diagnosis not present

## 2018-03-17 DIAGNOSIS — R7989 Other specified abnormal findings of blood chemistry: Secondary | ICD-10-CM | POA: Diagnosis not present

## 2018-03-17 DIAGNOSIS — E119 Type 2 diabetes mellitus without complications: Secondary | ICD-10-CM | POA: Diagnosis not present

## 2018-03-22 DIAGNOSIS — G4733 Obstructive sleep apnea (adult) (pediatric): Secondary | ICD-10-CM | POA: Diagnosis not present

## 2018-03-22 DIAGNOSIS — I10 Essential (primary) hypertension: Secondary | ICD-10-CM | POA: Diagnosis not present

## 2018-03-22 DIAGNOSIS — N185 Chronic kidney disease, stage 5: Secondary | ICD-10-CM | POA: Diagnosis not present

## 2018-03-22 DIAGNOSIS — E1140 Type 2 diabetes mellitus with diabetic neuropathy, unspecified: Secondary | ICD-10-CM | POA: Diagnosis not present

## 2018-03-30 DIAGNOSIS — D649 Anemia, unspecified: Secondary | ICD-10-CM | POA: Diagnosis not present

## 2018-07-11 ENCOUNTER — Other Ambulatory Visit: Payer: Self-pay

## 2018-07-11 DIAGNOSIS — N184 Chronic kidney disease, stage 4 (severe): Secondary | ICD-10-CM

## 2018-07-24 ENCOUNTER — Ambulatory Visit (INDEPENDENT_AMBULATORY_CARE_PROVIDER_SITE_OTHER): Payer: Medicare Other | Admitting: Vascular Surgery

## 2018-07-24 ENCOUNTER — Ambulatory Visit (HOSPITAL_COMMUNITY)
Admission: RE | Admit: 2018-07-24 | Discharge: 2018-07-24 | Disposition: A | Discharge status: Home / Self Care | Payer: Medicare Other | Source: Ambulatory Visit

## 2018-07-24 ENCOUNTER — Encounter: Payer: Self-pay | Admitting: Vascular Surgery

## 2018-07-24 ENCOUNTER — Ambulatory Visit (HOSPITAL_COMMUNITY)
Admission: RE | Admit: 2018-07-24 | Discharge: 2018-07-24 | Disposition: A | Discharge status: Home / Self Care | Payer: Medicare Other | Source: Ambulatory Visit | Attending: Internal Medicine | Admitting: Internal Medicine

## 2018-07-24 VITALS — BP 182/73 | HR 54 | Temp 97.7°F | Resp 20 | Wt 231.0 lb

## 2018-07-24 DIAGNOSIS — N184 Chronic kidney disease, stage 4 (severe): Secondary | ICD-10-CM

## 2018-07-24 NOTE — H&P (View-Only) (Signed)
Vascular and Vein Specialist of [facility] office  Patient name: Mary [name] MRN: [id number] DOB: 1945/10/01 Sex: female  REASON FOR CONSULT: Discuss access for hemodialysis  HPI: Mary [name] is a 73 y.o. female, who is here today for discussion of access for hemodialysis.  She is a resident of the [facility] in [place].  Has a history of progressive chronic renal insufficiency.  Is not on hemodialysis currently.  Long history of diabetes and also of congestive heart failure in the past.  She is status post right below-knee amputation related to infection in her right foot.  Does not have a pacemaker.  She is right-handed  Past Medical History:  Diagnosis Date  . Anemia   . Arthritis    knees  . Cellulitis and abscess of foot 02/12/2012  . CHF (congestive heart failure) ([place])   . Complication of anesthesia    pt states after her hysterectomy the doctor said she was "wild" when she woke up   . Diabetes mellitus   . Diabetes mellitus with neuropathy 07/25/2011  . Dysrhythmia    tachy- takes Metoprolol  . OSA (obstructive sleep apnea) 07/25/2011   not using CPAP  . Osteomyelitis of right foot ([place]) 02/14/2012  . Partial Achilles tendon tear 02/14/2012  . Restless leg syndrome 07/25/2011  . Wound, open    right foot    Family History  Problem Relation Age of Onset  . Diabetes Sister     SOCIAL HISTORY: Social History   Socioeconomic History  . Marital status: Married    Spouse name: Not on file  . Number of children: Not on file  . Years of education: Not on file  . Highest education level: Not on file  Occupational History  . Not on file  Social Needs  . Financial resource strain: Not on file  . Food insecurity:    Worry: Not on file    Inability: Not on file  . Transportation needs:    Medical: Not on file    Non-medical: Not on file  Tobacco Use  . Smoking status: Never Smoker  . Smokeless tobacco: Never  Used  Substance and Sexual Activity  . Alcohol use: No  . Drug use: No  . Sexual activity: Never  Lifestyle  . Physical activity:    Days per week: Not on file    Minutes per session: Not on file  . Stress: Not on file  Relationships  . Social connections:    Talks on phone: Not on file    Gets together: Not on file    Attends religious service: Not on file    Active member of club or organization: Not on file    Attends meetings of clubs or organizations: Not on file    Relationship status: Not on file  . Intimate partner violence:    Fear of current or ex partner: Not on file    Emotionally abused: Not on file    Physically abused: Not on file    Forced sexual activity: Not on file  Other Topics Concern  . Not on file  Social History Narrative  . Not on file    No Known Allergies  Current Outpatient Medications  Medication Sig Dispense Refill  . amLODipine (NORVASC) 10 MG tablet Take 10 mg by mouth daily.    . calcium-vitamin D (OSCAL WITH D) 500-200 MG-UNIT tablet Take 1 tablet by mouth.    . cholecalciferol (VITAMIN D) 1000 UNITS  tablet Take 1,000 Units by mouth daily.     Marland Kitchen doxazosin (CARDURA) 4 MG tablet Take 4 mg by mouth daily.    . DULoxetine (CYMBALTA) 60 MG capsule Take 1 capsule by mouth daily.    Marland Kitchen epoetin alfa-epbx (RETACRIT) 4000 UNIT/ML injection 4,000 Units 3 (three) times a week.    . ferrous sulfate 325 (65 FE) MG tablet Take 325 mg by mouth daily.     . insulin lispro protamine-lispro (HUMALOG 50/50 MIX) (50-50) 100 UNIT/ML SUSP injection Inject 0.2 mLs (20 Units total) into the skin 3 (three) times daily. (Patient taking differently: Inject 20-25 Units into the skin 3 (three) times daily. Patient self adjusts amount based on sugar levels) 10 mL 11  . insulin NPH-regular Human (70-30) 100 UNIT/ML injection Inject into the skin.    Marland Kitchen levETIRAcetam (KEPPRA) 100 MG/ML solution Take by mouth 2 (two) times daily.    . magnesium oxide (MAG-OX) 400 MG tablet  Take 400 mg by mouth daily.    . metoprolol tartrate (LOPRESSOR) 50 MG tablet Take 50 mg by mouth 2 (two) times daily.     . Multiple Vitamin (MULTIVITAMIN) tablet Take 1 tablet by mouth daily.    . ONE TOUCH ULTRA TEST test strip     . rOPINIRole (REQUIP) 2 MG tablet Take 2 mg by mouth at bedtime.     . torsemide (DEMADEX) 20 MG tablet Take 20 mg by mouth daily.     No current facility-administered medications for this visit.     REVIEW OF SYSTEMS:  [X]  denotes positive finding, [ ]  denotes negative finding Cardiac  Comments:  Chest pain or chest pressure:    Shortness of breath upon exertion:    Short of breath when lying flat:    Irregular heart rhythm: x       Vascular    Pain in calf, thigh, or hip brought on by ambulation:    Pain in feet at night that wakes you up from your sleep:     Blood clot in your veins:    Leg swelling:         Pulmonary    Oxygen at home:    Productive cough:     Wheezing:         Neurologic    Sudden weakness in arms or legs:     Sudden numbness in arms or legs:     Sudden onset of difficulty speaking or slurred speech:    Temporary loss of vision in one eye:     Problems with dizziness:         Gastrointestinal    Blood in stool:     Vomited blood:         Genitourinary    Burning when urinating:     Blood in urine:        Psychiatric    Major depression:         Hematologic    Bleeding problems:    Problems with blood clotting too easily:        Skin    Rashes or ulcers:        Constitutional    Fever or chills:      PHYSICAL EXAM: Vitals:   07/24/18 1025 07/24/18 1026  BP: (!) 193/165 (!) 182/73  Pulse: (!) 56 (!) 54  Resp: 20   Temp: 97.7 F (36.5 C)   TempSrc: Temporal   Weight: 231 lb (104.8 kg)     GENERAL: The  patient is a well-nourished female, in no acute distress. The vital signs are documented above. CARDIOVASCULAR: 2+ radial pulses bilaterally.  Palpable cephalic vein at her wrist and antecubital  space bilaterally. PULMONARY: There is good air exchange  ABDOMEN: Soft and non-tender  MUSCULOSKELETAL: There are no major deformities or cyanosis. NEUROLOGIC: No focal weakness or paresthesias are detected. SKIN: There are no ulcers or rashes noted. PSYCHIATRIC: The patient has a normal affect.  DATA:  Arterial and venous upper extremity study at [facility] was reviewed.  This does show a patent cephalic vein in the forearm and upper arm bilaterally with a moderate size bilaterally.  She has a large upper arm basilic vein bilaterally  I imaged her veins with SonoSite ultrasound as well.  This does show good size vein.  MEDICAL ISSUES: Chronic renal insufficiency.  Had a long discussion with the patient regarding options for hemodialysis.  I discussed tunneled dialysis catheter, AV fistula and AV graft.  She is not in need of dialysis currently and therefore would not need a catheter.  She does appear to have reasonable cephalic vein to attempt AV fistula and would perform this on the left side since she is right-hand dominant.  I discussed complications to include failure of maturation and the need for ongoing maintenance of access.  She understands and wishes to proceed as an outpatient at [facility]   Rosetta Posner, MD [facility] Vascular and Vein Specialists of [facility] Tel [phone] Pager [phone]

## 2018-07-24 NOTE — Progress Notes (Signed)
Vascular and Vein Specialist of [facility] office  Patient name: Mary [name] MRN: [id number] DOB: [birth year] Sex: female  REASON FOR CONSULT: Discuss access for hemodialysis  HPI: Mary [name] is a 73 y.o. female, who is here today for discussion of access for hemodialysis.  She is a resident of the [facility] in [place].  Has a history of progressive chronic renal insufficiency.  Is not on hemodialysis currently.  Long history of diabetes and also of congestive heart failure in the past.  She is status post right below-knee amputation related to infection in her right foot.  Does not have a pacemaker.  She is right-handed  Past Medical History:  Diagnosis Date  . Anemia   . Arthritis    knees  . Cellulitis and abscess of foot 02/12/2012  . CHF (congestive heart failure) ([place])   . Complication of anesthesia    pt states after her hysterectomy the doctor said she was "wild" when she woke up   . Diabetes mellitus   . Diabetes mellitus with neuropathy 07/25/2011  . Dysrhythmia    tachy- takes Metoprolol  . OSA (obstructive sleep apnea) 07/25/2011   not using CPAP  . Osteomyelitis of right foot ([place]) 02/14/2012  . Partial Achilles tendon tear 02/14/2012  . Restless leg syndrome 07/25/2011  . Wound, open    right foot    Family History  Problem Relation Age of Onset  . Diabetes Sister     SOCIAL HISTORY: Social History   Socioeconomic History  . Marital status: Married    Spouse name: Not on file  . Number of children: Not on file  . Years of education: Not on file  . Highest education level: Not on file  Occupational History  . Not on file  Social Needs  . Financial resource strain: Not on file  . Food insecurity:    Worry: Not on file    Inability: Not on file  . Transportation needs:    Medical: Not on file    Non-medical: Not on file  Tobacco Use  . Smoking status: Never Smoker  . Smokeless tobacco: Never  Used  Substance and Sexual Activity  . Alcohol use: No  . Drug use: No  . Sexual activity: Never  Lifestyle  . Physical activity:    Days per week: Not on file    Minutes per session: Not on file  . Stress: Not on file  Relationships  . Social connections:    Talks on phone: Not on file    Gets together: Not on file    Attends religious service: Not on file    Active member of club or organization: Not on file    Attends meetings of clubs or organizations: Not on file    Relationship status: Not on file  . Intimate partner violence:    Fear of current or ex partner: Not on file    Emotionally abused: Not on file    Physically abused: Not on file    Forced sexual activity: Not on file  Other Topics Concern  . Not on file  Social History Narrative  . Not on file    No Known Allergies  Current Outpatient Medications  Medication Sig Dispense Refill  . amLODipine (NORVASC) 10 MG tablet Take 10 mg by mouth daily.    . calcium-vitamin D (OSCAL WITH D) 500-200 MG-UNIT tablet Take 1 tablet by mouth.    . cholecalciferol (VITAMIN D) 1000 UNITS  tablet Take 1,000 Units by mouth daily.     Marland Kitchen doxazosin (CARDURA) 4 MG tablet Take 4 mg by mouth daily.    . DULoxetine (CYMBALTA) 60 MG capsule Take 1 capsule by mouth daily.    Marland Kitchen epoetin alfa-epbx (RETACRIT) 4000 UNIT/ML injection 4,000 Units 3 (three) times a week.    . ferrous sulfate 325 (65 FE) MG tablet Take 325 mg by mouth daily.     . insulin lispro protamine-lispro (HUMALOG 50/50 MIX) (50-50) 100 UNIT/ML SUSP injection Inject 0.2 mLs (20 Units total) into the skin 3 (three) times daily. (Patient taking differently: Inject 20-25 Units into the skin 3 (three) times daily. Patient self adjusts amount based on sugar levels) 10 mL 11  . insulin NPH-regular Human (70-30) 100 UNIT/ML injection Inject into the skin.    Marland Kitchen levETIRAcetam (KEPPRA) 100 MG/ML solution Take by mouth 2 (two) times daily.    . magnesium oxide (MAG-OX) 400 MG tablet  Take 400 mg by mouth daily.    . metoprolol tartrate (LOPRESSOR) 50 MG tablet Take 50 mg by mouth 2 (two) times daily.     . Multiple Vitamin (MULTIVITAMIN) tablet Take 1 tablet by mouth daily.    . ONE TOUCH ULTRA TEST test strip     . rOPINIRole (REQUIP) 2 MG tablet Take 2 mg by mouth at bedtime.     . torsemide (DEMADEX) 20 MG tablet Take 20 mg by mouth daily.     No current facility-administered medications for this visit.     REVIEW OF SYSTEMS:  [X]  denotes positive finding, [ ]  denotes negative finding Cardiac  Comments:  Chest pain or chest pressure:    Shortness of breath upon exertion:    Short of breath when lying flat:    Irregular heart rhythm: x       Vascular    Pain in calf, thigh, or hip brought on by ambulation:    Pain in feet at night that wakes you up from your sleep:     Blood clot in your veins:    Leg swelling:         Pulmonary    Oxygen at home:    Productive cough:     Wheezing:         Neurologic    Sudden weakness in arms or legs:     Sudden numbness in arms or legs:     Sudden onset of difficulty speaking or slurred speech:    Temporary loss of vision in one eye:     Problems with dizziness:         Gastrointestinal    Blood in stool:     Vomited blood:         Genitourinary    Burning when urinating:     Blood in urine:        Psychiatric    Major depression:         Hematologic    Bleeding problems:    Problems with blood clotting too easily:        Skin    Rashes or ulcers:        Constitutional    Fever or chills:      PHYSICAL EXAM: Vitals:   07/24/18 1025 07/24/18 1026  BP: (!) 193/165 (!) 182/73  Pulse: (!) 56 (!) 54  Resp: 20   Temp: 97.7 F (36.5 C)   TempSrc: Temporal   Weight: 231 lb (104.8 kg)     GENERAL: The  patient is a well-nourished female, in no acute distress. The vital signs are documented above. CARDIOVASCULAR: 2+ radial pulses bilaterally.  Palpable cephalic vein at her wrist and antecubital  space bilaterally. PULMONARY: There is good air exchange  ABDOMEN: Soft and non-tender  MUSCULOSKELETAL: There are no major deformities or cyanosis. NEUROLOGIC: No focal weakness or paresthesias are detected. SKIN: There are no ulcers or rashes noted. PSYCHIATRIC: The patient has a normal affect.  DATA:  Arterial and venous upper extremity study at [facility] was reviewed.  This does show a patent cephalic vein in the forearm and upper arm bilaterally with a moderate size bilaterally.  She has a large upper arm basilic vein bilaterally  I imaged her veins with SonoSite ultrasound as well.  This does show good size vein.  MEDICAL ISSUES: Chronic renal insufficiency.  Had a long discussion with the patient regarding options for hemodialysis.  I discussed tunneled dialysis catheter, AV fistula and AV graft.  She is not in need of dialysis currently and therefore would not need a catheter.  She does appear to have reasonable cephalic vein to attempt AV fistula and would perform this on the left side since she is right-hand dominant.  I discussed complications to include failure of maturation and the need for ongoing maintenance of access.  She understands and wishes to proceed as an outpatient at [facility]   Rosetta Posner, MD [facility] Vascular and Vein Specialists of [facility] Tel [phone] Pager [phone]

## 2018-07-25 ENCOUNTER — Other Ambulatory Visit: Payer: Self-pay

## 2018-07-25 ENCOUNTER — Encounter: Payer: Self-pay | Admitting: Vascular Surgery

## 2018-07-26 ENCOUNTER — Encounter: Payer: Self-pay | Admitting: Geriatric Medicine

## 2018-07-31 ENCOUNTER — Other Ambulatory Visit: Payer: Self-pay

## 2018-07-31 ENCOUNTER — Encounter (HOSPITAL_COMMUNITY): Payer: Self-pay | Admitting: Emergency Medicine

## 2018-07-31 ENCOUNTER — Encounter (HOSPITAL_COMMUNITY): Payer: Self-pay | Admitting: Physician Assistant

## 2018-07-31 ENCOUNTER — Emergency Department (HOSPITAL_COMMUNITY): Payer: Medicare Other

## 2018-07-31 ENCOUNTER — Inpatient Hospital Stay (HOSPITAL_COMMUNITY)
Admission: EM | Admit: 2018-07-31 | Discharge: 2018-08-03 | DRG: 291 | Disposition: A | Discharge status: Skilled Nursing Facility | Payer: Medicare Other | Source: Skilled Nursing Facility | Attending: Family Medicine | Admitting: Family Medicine

## 2018-07-31 ENCOUNTER — Encounter (HOSPITAL_COMMUNITY): Payer: Self-pay | Admitting: *Deleted

## 2018-07-31 ENCOUNTER — Telehealth: Payer: Self-pay | Admitting: *Deleted

## 2018-07-31 DIAGNOSIS — I5033 Acute on chronic diastolic (congestive) heart failure: Secondary | ICD-10-CM | POA: Diagnosis not present

## 2018-07-31 DIAGNOSIS — R296 Repeated falls: Secondary | ICD-10-CM | POA: Diagnosis present

## 2018-07-31 DIAGNOSIS — N2581 Secondary hyperparathyroidism of renal origin: Secondary | ICD-10-CM | POA: Diagnosis present

## 2018-07-31 DIAGNOSIS — D631 Anemia in chronic kidney disease: Secondary | ICD-10-CM | POA: Diagnosis present

## 2018-07-31 DIAGNOSIS — Z794 Long term (current) use of insulin: Secondary | ICD-10-CM

## 2018-07-31 DIAGNOSIS — Z6841 Body Mass Index (BMI) 40.0 and over, adult: Secondary | ICD-10-CM

## 2018-07-31 DIAGNOSIS — F419 Anxiety disorder, unspecified: Secondary | ICD-10-CM | POA: Diagnosis present

## 2018-07-31 DIAGNOSIS — Z89512 Acquired absence of left leg below knee: Secondary | ICD-10-CM

## 2018-07-31 DIAGNOSIS — M898X9 Other specified disorders of bone, unspecified site: Secondary | ICD-10-CM | POA: Diagnosis present

## 2018-07-31 DIAGNOSIS — N179 Acute kidney failure, unspecified: Secondary | ICD-10-CM | POA: Diagnosis present

## 2018-07-31 DIAGNOSIS — E1151 Type 2 diabetes mellitus with diabetic peripheral angiopathy without gangrene: Secondary | ICD-10-CM | POA: Diagnosis present

## 2018-07-31 DIAGNOSIS — G4733 Obstructive sleep apnea (adult) (pediatric): Secondary | ICD-10-CM | POA: Diagnosis present

## 2018-07-31 DIAGNOSIS — G2581 Restless legs syndrome: Secondary | ICD-10-CM | POA: Diagnosis present

## 2018-07-31 DIAGNOSIS — Z89511 Acquired absence of right leg below knee: Secondary | ICD-10-CM

## 2018-07-31 DIAGNOSIS — E669 Obesity, unspecified: Secondary | ICD-10-CM | POA: Diagnosis present

## 2018-07-31 DIAGNOSIS — Z833 Family history of diabetes mellitus: Secondary | ICD-10-CM

## 2018-07-31 DIAGNOSIS — D509 Iron deficiency anemia, unspecified: Secondary | ICD-10-CM | POA: Diagnosis present

## 2018-07-31 DIAGNOSIS — L8990 Pressure ulcer of unspecified site, unspecified stage: Secondary | ICD-10-CM | POA: Diagnosis present

## 2018-07-31 DIAGNOSIS — E875 Hyperkalemia: Secondary | ICD-10-CM | POA: Diagnosis not present

## 2018-07-31 DIAGNOSIS — N189 Chronic kidney disease, unspecified: Secondary | ICD-10-CM

## 2018-07-31 DIAGNOSIS — R0989 Other specified symptoms and signs involving the circulatory and respiratory systems: Secondary | ICD-10-CM | POA: Diagnosis present

## 2018-07-31 DIAGNOSIS — I509 Heart failure, unspecified: Secondary | ICD-10-CM

## 2018-07-31 DIAGNOSIS — I132 Hypertensive heart and chronic kidney disease with heart failure and with stage 5 chronic kidney disease, or end stage renal disease: Secondary | ICD-10-CM | POA: Diagnosis not present

## 2018-07-31 DIAGNOSIS — N185 Chronic kidney disease, stage 5: Secondary | ICD-10-CM | POA: Diagnosis present

## 2018-07-31 DIAGNOSIS — I739 Peripheral vascular disease, unspecified: Secondary | ICD-10-CM | POA: Diagnosis present

## 2018-07-31 DIAGNOSIS — I10 Essential (primary) hypertension: Secondary | ICD-10-CM | POA: Diagnosis present

## 2018-07-31 DIAGNOSIS — E1122 Type 2 diabetes mellitus with diabetic chronic kidney disease: Secondary | ICD-10-CM | POA: Diagnosis present

## 2018-07-31 DIAGNOSIS — I519 Heart disease, unspecified: Secondary | ICD-10-CM | POA: Diagnosis present

## 2018-07-31 DIAGNOSIS — G8929 Other chronic pain: Secondary | ICD-10-CM | POA: Diagnosis present

## 2018-07-31 DIAGNOSIS — E1140 Type 2 diabetes mellitus with diabetic neuropathy, unspecified: Secondary | ICD-10-CM | POA: Diagnosis present

## 2018-07-31 LAB — CBC WITH DIFFERENTIAL/PLATELET
Abs Immature Granulocytes: 0.02 10*3/uL (ref 0.00–0.07)
Basophils Absolute: 0 10*3/uL (ref 0.0–0.1)
Basophils Relative: 1 %
EOS ABS: 0.3 10*3/uL (ref 0.0–0.5)
Eosinophils Relative: 5 %
HEMATOCRIT: 27.9 % — AB (ref 36.0–46.0)
Hemoglobin: 8.6 g/dL — ABNORMAL LOW (ref 12.0–15.0)
IMMATURE GRANULOCYTES: 0 %
LYMPHS ABS: 0.8 10*3/uL (ref 0.7–4.0)
Lymphocytes Relative: 14 %
MCH: 28.8 pg (ref 26.0–34.0)
MCHC: 30.8 g/dL (ref 30.0–36.0)
MCV: 93.3 fL (ref 80.0–100.0)
MONOS PCT: 9 %
Monocytes Absolute: 0.6 10*3/uL (ref 0.1–1.0)
Neutro Abs: 4.4 10*3/uL (ref 1.7–7.7)
Neutrophils Relative %: 71 %
Platelets: 190 10*3/uL (ref 150–400)
RBC: 2.99 MIL/uL — ABNORMAL LOW (ref 3.87–5.11)
RDW: 15 % (ref 11.5–15.5)
WBC: 6.2 10*3/uL (ref 4.0–10.5)
nRBC: 0 % (ref 0.0–0.2)

## 2018-07-31 LAB — BASIC METABOLIC PANEL
Anion gap: 9 (ref 5–15)
BUN: 80 mg/dL — ABNORMAL HIGH (ref 8–23)
CO2: 24 mmol/L (ref 22–32)
Calcium: 8 mg/dL — ABNORMAL LOW (ref 8.9–10.3)
Chloride: 105 mmol/L (ref 98–111)
Creatinine, Ser: 3.45 mg/dL — ABNORMAL HIGH (ref 0.44–1.00)
GFR calc Af Amer: 15 mL/min — ABNORMAL LOW (ref >60)
GFR calc non Af Amer: 13 mL/min — ABNORMAL LOW (ref >60)
Glucose, Bld: 162 mg/dL — ABNORMAL HIGH (ref 70–99)
Potassium: 5.6 mmol/L — ABNORMAL HIGH (ref 3.5–5.1)
Sodium: 138 mmol/L (ref 135–145)

## 2018-07-31 LAB — BRAIN NATRIURETIC PEPTIDE: B Natriuretic Peptide: 381 pg/mL — ABNORMAL HIGH (ref 0.0–100.0)

## 2018-07-31 LAB — PHOSPHORUS: Phosphorus: 6 mg/dL — ABNORMAL HIGH (ref 2.5–4.6)

## 2018-07-31 LAB — MAGNESIUM: Magnesium: 2.4 mg/dL (ref 1.7–2.4)

## 2018-07-31 LAB — GLUCOSE, CAPILLARY: Glucose-Capillary: 107 mg/dL — ABNORMAL HIGH (ref 70–99)

## 2018-07-31 LAB — TROPONIN I

## 2018-07-31 MED ORDER — ONDANSETRON HCL 4 MG PO TABS: 4.0000 mg | ORAL_TABLET | Freq: Four times a day (QID) | ORAL | Status: DC | PRN

## 2018-07-31 MED ORDER — INSULIN ASPART 100 UNIT/ML ~~LOC~~ SOLN
SUBCUTANEOUS | Status: AC
  Filled 2018-07-31: qty 1

## 2018-07-31 MED ORDER — DEXTROSE 50 % IV SOLN
1.0000 | Freq: Once | INTRAVENOUS | Status: AC
  Administered 2018-07-31: 50 mL via INTRAVENOUS

## 2018-07-31 MED ORDER — OXYCODONE-ACETAMINOPHEN 7.5-325 MG PO TABS
1.0000 | ORAL_TABLET | Freq: Three times a day (TID) | ORAL | Status: DC | PRN
  Administered 2018-08-01 – 2018-08-02 (×3): 1 via ORAL
  Filled 2018-07-31 (×3): qty 1

## 2018-07-31 MED ORDER — ONDANSETRON HCL 4 MG/2ML IJ SOLN: 4.0000 mg | Freq: Four times a day (QID) | INTRAMUSCULAR | Status: DC | PRN

## 2018-07-31 MED ORDER — MAGNESIUM OXIDE 400 (241.3 MG) MG PO TABS
400.0000 mg | ORAL_TABLET | Freq: Every day | ORAL | Status: DC
  Administered 2018-08-01 – 2018-08-03 (×3): 400 mg via ORAL
  Filled 2018-07-31 (×3): qty 1

## 2018-07-31 MED ORDER — DOXAZOSIN MESYLATE 2 MG PO TABS
4.0000 mg | ORAL_TABLET | Freq: Every day | ORAL | Status: DC
  Administered 2018-07-31 – 2018-08-02 (×3): 4 mg via ORAL
  Filled 2018-07-31 (×3): qty 2

## 2018-07-31 MED ORDER — INSULIN DETEMIR 100 UNIT/ML ~~LOC~~ SOLN
15.0000 [IU] | Freq: Every day | SUBCUTANEOUS | Status: DC
  Administered 2018-07-31 – 2018-08-02 (×2): 15 [IU] via SUBCUTANEOUS
  Filled 2018-07-31 (×4): qty 0.15

## 2018-07-31 MED ORDER — SODIUM BICARBONATE 8.4 % IV SOLN
50.0000 meq | Freq: Once | INTRAVENOUS | Status: AC
  Administered 2018-07-31: 50 meq via INTRAVENOUS
  Filled 2018-07-31: qty 50

## 2018-07-31 MED ORDER — ROPINIROLE HCL 1 MG PO TABS
4.0000 mg | ORAL_TABLET | Freq: Every day | ORAL | Status: DC
  Administered 2018-07-31 – 2018-08-02 (×3): 4 mg via ORAL
  Filled 2018-07-31 (×3): qty 4

## 2018-07-31 MED ORDER — ACETAMINOPHEN 650 MG RE SUPP: 650.0000 mg | Freq: Four times a day (QID) | RECTAL | Status: DC | PRN

## 2018-07-31 MED ORDER — DEXTROSE 50 % IV SOLN
INTRAVENOUS | Status: AC
  Filled 2018-07-31: qty 50

## 2018-07-31 MED ORDER — POLYETHYLENE GLYCOL 3350 17 G PO PACK: 17.0000 g | PACK | Freq: Every day | ORAL | Status: DC | PRN

## 2018-07-31 MED ORDER — DIAZEPAM 2 MG PO TABS
2.0000 mg | ORAL_TABLET | Freq: Two times a day (BID) | ORAL | Status: DC
  Administered 2018-07-31 – 2018-08-03 (×6): 2 mg via ORAL
  Filled 2018-07-31 (×6): qty 1

## 2018-07-31 MED ORDER — CALCIUM ACETATE (PHOS BINDER) 667 MG PO CAPS
667.0000 mg | ORAL_CAPSULE | Freq: Three times a day (TID) | ORAL | Status: DC
  Administered 2018-08-01 – 2018-08-03 (×7): 667 mg via ORAL
  Filled 2018-07-31 (×7): qty 1

## 2018-07-31 MED ORDER — TIZANIDINE HCL 4 MG PO TABS
4.0000 mg | ORAL_TABLET | Freq: Three times a day (TID) | ORAL | Status: DC
  Administered 2018-07-31 – 2018-08-03 (×8): 4 mg via ORAL
  Filled 2018-07-31 (×8): qty 1

## 2018-07-31 MED ORDER — INSULIN ASPART 100 UNIT/ML IV SOLN
5.0000 [IU] | Freq: Once | INTRAVENOUS | Status: AC
  Administered 2018-07-31: 5 [IU] via INTRAVENOUS

## 2018-07-31 MED ORDER — CALCIUM GLUCONATE 10 % IV SOLN
1.0000 g | Freq: Once | INTRAVENOUS | Status: AC
  Administered 2018-07-31: 1 g via INTRAVENOUS
  Filled 2018-07-31: qty 10

## 2018-07-31 MED ORDER — HEPARIN SODIUM (PORCINE) 5000 UNIT/ML IJ SOLN
5000.0000 [IU] | Freq: Three times a day (TID) | INTRAMUSCULAR | Status: DC
  Administered 2018-07-31 – 2018-08-03 (×6): 5000 [IU] via SUBCUTANEOUS
  Filled 2018-07-31 (×8): qty 1

## 2018-07-31 MED ORDER — FERROUS SULFATE 325 (65 FE) MG PO TABS
325.0000 mg | ORAL_TABLET | Freq: Every day | ORAL | Status: DC
  Administered 2018-08-01 – 2018-08-03 (×3): 325 mg via ORAL
  Filled 2018-07-31 (×3): qty 1

## 2018-07-31 MED ORDER — FUROSEMIDE 10 MG/ML IJ SOLN
60.0000 mg | Freq: Two times a day (BID) | INTRAMUSCULAR | Status: DC
  Administered 2018-08-01: 60 mg via INTRAVENOUS
  Filled 2018-07-31: qty 6

## 2018-07-31 MED ORDER — ROPINIROLE HCL 1 MG PO TABS
2.0000 mg | ORAL_TABLET | Freq: Once | ORAL | Status: AC
  Administered 2018-07-31: 2 mg via ORAL
  Filled 2018-07-31 (×2): qty 2

## 2018-07-31 MED ORDER — ACETAMINOPHEN 325 MG PO TABS: 650.0000 mg | ORAL_TABLET | Freq: Four times a day (QID) | ORAL | Status: DC | PRN

## 2018-07-31 MED ORDER — FUROSEMIDE 10 MG/ML IJ SOLN
80.0000 mg | Freq: Once | INTRAMUSCULAR | Status: AC
  Administered 2018-07-31: 80 mg via INTRAVENOUS
  Filled 2018-07-31: qty 8

## 2018-07-31 MED ORDER — INSULIN ASPART 100 UNIT/ML ~~LOC~~ SOLN
0.0000 [IU] | Freq: Three times a day (TID) | SUBCUTANEOUS | Status: DC
  Administered 2018-08-02 (×2): 2 [IU] via SUBCUTANEOUS
  Administered 2018-08-02: 1 [IU] via SUBCUTANEOUS
  Administered 2018-08-03: 5 [IU] via SUBCUTANEOUS

## 2018-07-31 MED ORDER — METOPROLOL TARTRATE 50 MG PO TABS
100.0000 mg | ORAL_TABLET | Freq: Two times a day (BID) | ORAL | Status: DC
  Administered 2018-07-31 – 2018-08-03 (×6): 100 mg via ORAL
  Filled 2018-07-31 (×6): qty 2

## 2018-07-31 MED ORDER — AMLODIPINE BESYLATE 5 MG PO TABS
10.0000 mg | ORAL_TABLET | Freq: Every day | ORAL | Status: DC
  Administered 2018-08-01 – 2018-08-03 (×3): 10 mg via ORAL
  Filled 2018-07-31 (×3): qty 2

## 2018-07-31 MED ORDER — DIAZEPAM 2 MG PO TABS
2.0000 mg | ORAL_TABLET | Freq: Once | ORAL | Status: AC
  Administered 2018-07-31: 2 mg via ORAL
  Filled 2018-07-31: qty 1

## 2018-07-31 MED ORDER — HYDRALAZINE HCL 20 MG/ML IJ SOLN: 10.0000 mg | INTRAMUSCULAR | Status: DC | PRN

## 2018-07-31 MED ORDER — SERTRALINE HCL 50 MG PO TABS
50.0000 mg | ORAL_TABLET | Freq: Every day | ORAL | Status: DC
  Administered 2018-08-01 – 2018-08-03 (×3): 50 mg via ORAL
  Filled 2018-07-31 (×3): qty 1

## 2018-07-31 NOTE — ED Triage Notes (Signed)
Pt states she has been very short of breath for the past few days and they have been giving her IM lasix for CHF.  Today had labs done and she is in ARF with K+ of 6.5.

## 2018-07-31 NOTE — ED Provider Notes (Signed)
[facility] EMERGENCY DEPARTMENT Provider Note   CSN: [id number] Arrival date & time: 07/31/18  1626    History   Chief Complaint Chief Complaint  Patient presents with  . Abnormal Lab    HPI Mary [name] is a 73 y.o. female.     HPI 73 year old female with history of CHF, CKD, diabetes, hypertension, restless leg syndrome comes in with chief complaint of shortness of breath and abnormal labs.  Patient reports that she has been feeling short of breath for the last few days.  Normally she is able to walk using a walker, however the last few days she has been essentially wheelchair-bound because of her exertional shortness of breath.  She also reports that she has worsening in shortness of breath when she lays flat.  At her nursing home they check regular labs.  Today her potassium was noted to be 6.1 and she was sent to the ER for further treatment.  Patient has known chronic kidney disease.  She denies any new medications.  She also denies any UTI-like symptoms.   Past Medical History:  Diagnosis Date  . Anemia   . Arthritis    knees  . Cellulitis and abscess of foot 02/12/2012  . CHF (congestive heart failure) ([place])   . Complication of anesthesia    pt states after her hysterectomy the doctor said she was "wild" when she woke up   . Diabetes mellitus   . Diabetes mellitus with neuropathy 07/25/2011  . Dysrhythmia    tachy- takes Metoprolol  . Hypertension   . OSA (obstructive sleep apnea) 07/25/2011   not using CPAP  . Osteomyelitis of right foot ([place]) 02/14/2012  . Partial Achilles tendon tear 02/14/2012  . Pneumonia   . Restless leg syndrome 07/25/2011  . Restless leg syndrome   . Wound, open    right foot    Patient Active Problem List   Diagnosis Date Noted  . Multiple falls 08/20/2017  . Fall at home   . Pericardial effusion 12/15/2016  . Bilateral carotid bruits 12/15/2016  . Cardiomyopathy ([place])   . Left ventricular dysfunction   . Acute on chronic  combined systolic and diastolic CHF (congestive heart failure) ([place])   . Right ventricular dysfunction   . CKD (chronic kidney disease), stage IV ([place]) 11/30/2016  . CHF exacerbation ([place]) 11/28/2016  . Fall 11/28/2016  . Acute on chronic diastolic CHF (congestive heart failure) ([place]) 11/28/2016  . Elevated troponin 07/19/2015  . Chronic diastolic CHF (congestive heart failure) ([place]) 01/13/2014  . Expressive aphasia 01/13/2014  . CHF (congestive heart failure) ([place])   . Unstable balance 01/16/2013  . Difficulty in walking(719.7) 01/16/2013  . Infected wound 01/16/2013  . Hx of right BKA ([place]) 12/23/2012  . Candidiasis of breast 12/08/2012  . S/P bilateral BKA (below knee amputation) ([place]) 11/30/2012  . Diabetes ([place]) 11/30/2012  . Diabetic foot ulcer ([place]) 10/08/2012  . Anxiety 02/17/2012  . Osteomyelitis of right foot ([place]) 02/14/2012  . Hematuria, microscopic 02/13/2012  . PVD (peripheral vascular disease) ([place]) 02/08/2012  . Open wound of heel 02/08/2012  . Hyperlipidemia 11/11/2011  . Hypertension 11/11/2011  . Morbid obesity ([place]) 11/11/2011  . Tachycardia 08/04/2011  . Acute on chronic diastolic heart failure ([place]) 07/25/2011  . Diabetes mellitus with neuropathy 07/25/2011  . OSA (obstructive sleep apnea) 07/25/2011  . Physical debility 07/25/2011  . Acute respiratory failure ([place]) 07/25/2011  . Restless leg syndrome 07/25/2011  . Anemia 07/25/2011    Past Surgical History:  Procedure Laterality Date  . ABDOMINAL HYSTERECTOMY    . ACHILLES TENDON REPAIR    . AMPUTATION Right 11/25/2012   Procedure: AMPUTATION BELOW KNEE;  Surgeon: Newt Minion, MD;  Location: [place];  Service: Orthopedics;  Laterality: Right;  Right Below Knee Amputation  . BELOW KNEE LEG AMPUTATION Right 11/25/2012   Dr Sharol Given  . BREAST SURGERY Left    Lumpectomy non ca  . CATARACT EXTRACTION W/PHACO Right 10/09/2013   Procedure: CATARACT EXTRACTION PHACO AND INTRAOCULAR LENS PLACEMENT (IOC);   Surgeon: Elta Guadeloupe T. Gershon Crane, MD;  Location: [facility];  Service: Ophthalmology;  Laterality: Right;  CDE:9.05  . CATARACT EXTRACTION W/PHACO Left 10/23/2013   Procedure: ATTEMPTED CATARACT EXTRACTION PHACO AND INTRAOCULAR LENS PLACEMENT;  Surgeon: Elta Guadeloupe T. Gershon Crane, MD;  Location: [facility];  Service: Ophthalmology;  Laterality: Left;  CDE:  4.41  . TOE AMPUTATION     partial rt great toe     OB History    Gravida  1   Para  1   Term  1   Preterm      AB      Living  1     SAB      TAB      Ectopic      Multiple      Live Births               Home Medications    Prior to Admission medications   Medication Sig Start Date End Date Taking? Authorizing Provider  amLODipine (NORVASC) 10 MG tablet Take 10 mg by mouth daily.   Yes [provider]  calcium acetate (PHOSLO) 667 MG capsule Take 667 mg by mouth 3 (three) times daily with meals.   Yes [provider]  diazepam (VALIUM) 2 MG tablet Take 2 mg by mouth 2 (two) times daily.   Yes [provider]  doxazosin (CARDURA) 4 MG tablet Take 4 mg by mouth at bedtime.    Yes [provider]  epoetin alfa-epbx (RETACRIT) 4000 UNIT/ML injection Inject 4,000 Units into the skin every 14 (fourteen) days.    Yes [provider]  ferrous sulfate 325 (65 FE) MG tablet Take 325 mg by mouth daily.    Yes [provider]  gabapentin (NEURONTIN) 600 MG tablet Take 600 mg by mouth 3 (three) times daily.   Yes [provider]  guaiFENesin-dextromethorphan (ROBITUSSIN DM) 100-10 MG/5ML syrup Take 10 mLs by mouth every 4 (four) hours as needed for cough.   Yes [provider]  insulin detemir (LEVEMIR) 100 UNIT/ML injection Inject 20 Units into the skin at bedtime.   Yes [provider]  insulin NPH-regular Human (70-30) 100 UNIT/ML injection Inject 28 Units into the skin daily. Hold if BS is less than 200   Yes [provider]  magnesium oxide (MAG-OX) 400 MG  tablet Take 400 mg by mouth daily.   Yes [provider]  metoprolol tartrate (LOPRESSOR) 100 MG tablet Take 100 mg by mouth 2 (two) times daily.   Yes [provider]  Multiple Vitamin (MULTIVITAMIN) tablet Take 1 tablet by mouth daily.   Yes [provider]  oxyCODONE-acetaminophen (PERCOCET) 7.5-325 MG tablet Take 1 tablet by mouth 3 (three) times daily.   Yes [provider]  rOPINIRole (REQUIP) 2 MG tablet Take 4 mg by mouth at bedtime.  06/09/17  Yes [provider]  sertraline (ZOLOFT) 50 MG tablet Take 50 mg by mouth daily.   Yes  [provider]  tiZANidine (ZANAFLEX) 4 MG tablet Take 4 mg by mouth 3 (three) times daily.   Yes [provider]  torsemide (DEMADEX) 20 MG tablet Take 30 mg by mouth daily.    Yes [provider]  Vitamin D, Ergocalciferol, (DRISDOL) 1.25 MG (50000 UT) CAPS capsule Take 50,000 Units by mouth every Monday.   Yes [provider]  promethazine (PHENERGAN) 25 MG tablet Take 25 mg by mouth every 4 (four) hours as needed for nausea or vomiting.    [provider]    Family History Family History  Problem Relation Age of Onset  . Diabetes Sister     Social History Social History   Tobacco Use  . Smoking status: Never Smoker  . Smokeless tobacco: Never Used  Substance Use Topics  . Alcohol use: No  . Drug use: No     Allergies   Patient has no known allergies.   Review of Systems Review of Systems  Constitutional: Positive for activity change.  Respiratory: Positive for shortness of breath. Negative for cough.   Cardiovascular: Negative for chest pain.  Genitourinary: Negative for dysuria.  Psychiatric/Behavioral: The patient is nervous/anxious.   All other systems reviewed and are negative.    Physical Exam Updated Vital Signs BP (!) 158/62   Pulse 68   Temp (!) 97.5 F (36.4 C) (Oral)   Resp 12   Ht 5\' 5"  (1.651 m)   Wt 111.1 kg   LMP 07/25/2011    SpO2 100%   BMI 40.77 kg/m   Physical Exam Vitals signs and nursing note reviewed.  Constitutional:      Appearance: She is well-developed.  HENT:     Head: Normocephalic and atraumatic.  Eyes:     Pupils: Pupils are equal, round, and reactive to light.  Neck:     Musculoskeletal: Neck supple.  Cardiovascular:     Rate and Rhythm: Normal rate and regular rhythm.     Heart sounds: Murmur present.  Pulmonary:     Effort: No respiratory distress.     Breath sounds: Rales present. No wheezing.  Abdominal:     General: There is no distension.     Palpations: Abdomen is soft.     Tenderness: There is no abdominal tenderness. There is no guarding or rebound.  Musculoskeletal:     Right lower leg: No edema.     Left lower leg: No edema.  Skin:    General: Skin is warm and dry.  Neurological:     Mental Status: She is alert and oriented to person, place, and time.      ED Treatments / Results  Labs (all labs ordered are listed, but only abnormal results are displayed) Labs Reviewed  BASIC METABOLIC PANEL - Abnormal; Notable for the following components:      Result Value   Potassium 5.6 (*)    Glucose, Bld 162 (*)    BUN 80 (*)    Creatinine, Ser 3.45 (*)    Calcium 8.0 (*)    GFR calc non Af Amer 13 (*)    GFR calc Af Amer 15 (*)    All other components within normal limits  CBC WITH DIFFERENTIAL/PLATELET - Abnormal; Notable for the following components:   RBC 2.99 (*)    Hemoglobin 8.6 (*)    HCT 27.9 (*)    All other components within normal limits  BRAIN NATRIURETIC PEPTIDE - Abnormal; Notable for the following components:   B Natriuretic  Peptide 381.0 (*)    All other components within normal limits  PHOSPHORUS - Abnormal; Notable for the following components:   Phosphorus 6.0 (*)    All other components within normal limits  MAGNESIUM    EKG EKG Interpretation  Date/Time:  Monday July 31 2018 16:41:06 EST Ventricular Rate:  62 PR Interval:    QRS  Duration: 100 QT Interval:  449 QTC Calculation: 456 R Axis:   7 Text Interpretation:  Sinus rhythm Low voltage, precordial leads No acute changes No significant change since last tracing Confirmed by Varney Biles [phone]) on 07/31/2018 4:59:07 PM   Radiology Dg Chest 2 View  Result Date: 07/31/2018 CLINICAL DATA:  Shortness of breath for a few days. EXAM: CHEST - 2 VIEW COMPARISON:  May 31, 2018 FINDINGS: Cardiomegaly. Mild to moderate pulmonary edema. No other interval changes or acute abnormalities. IMPRESSION: Cardiomegaly and mild to moderate pulmonary edema. Electronically Signed   By: Dorise Bullion III M.D   On: 07/31/2018 18:14    Procedures .Critical Care Performed by: Varney Biles, MD Authorized by: Varney Biles, MD   Critical care provider statement:    Critical care time (minutes):  45   Critical care was necessary to treat or prevent imminent or life-threatening deterioration of the following conditions:  Cardiac failure and renal failure   Critical care was time spent personally by me on the following activities:  Discussions with consultants, evaluation of patient's response to treatment, examination of patient, ordering and performing treatments and interventions, ordering and review of laboratory studies, ordering and review of radiographic studies, pulse oximetry, re-evaluation of patient's condition, obtaining history from patient or surrogate and review of old charts   (including critical care time)  Medications Ordered in ED Medications  dextrose 50 % solution (has no administration in time range)  furosemide (LASIX) injection 80 mg (has no administration in time range)  calcium gluconate inj 10% (1 g) URGENT USE ONLY! (1 g Intravenous Given 07/31/18 1724)  insulin aspart (novoLOG) injection 5 Units (5 Units Intravenous Given 07/31/18 1735)    And  dextrose 50 % solution 50 mL (50 mLs Intravenous Given 07/31/18 1715)  sodium bicarbonate injection 50 mEq (50  mEq Intravenous Given 07/31/18 1715)  rOPINIRole (REQUIP) tablet 2 mg (2 mg Oral Given 07/31/18 1818)  diazepam (VALIUM) tablet 2 mg (2 mg Oral Given 07/31/18 1809)     Initial Impression / Assessment and Plan / ED Course  I have reviewed the triage vital signs and the nursing notes.  Pertinent labs & imaging results that were available during my care of the patient were reviewed by me and considered in my medical decision making (see chart for details).        73 year old female comes in with chief complaint of shortness of breath.  According to the nursing home she was also hyperkalemic.  Patient has known history of CHF, CKD.  She denies any new medications.  Her shortness of breath appears to be clinically due to CHF as she is having orthopnea and exertional dyspnea.  She does not have any chest pain and we do not have clinical concerns for ACS.  EKG is reassuring, suspicion for PE is also low.  Chest x-ray does confirm pulmonary edema and congestion. Patient's labs here show elevated creatinine and elevated BUN to creatinine ratio.  It appears that he has renal failure that is prerenal in nature with underlying cause being pump failure.  We will give patient Lasix 80 mg to  see how she responds.  Patient will require to be admitted for optimization.  For her hyperkalemia she has received IV sodium bicarb, calcium gluconate and insulin plus other pneumonia ampule of D50.  I need to look up on that   Final Clinical Impressions(s) / ED Diagnoses   Final diagnoses:  Acute hyperkalemia  Acute congestive heart failure, unspecified heart failure type ([place])  Acute renal failure superimposed on chronic kidney disease, unspecified CKD stage, unspecified acute renal failure type [facility])    ED Discharge Orders    None       Varney Biles, MD 07/31/18 1930

## 2018-07-31 NOTE — Telephone Encounter (Addendum)
Juanda Crumble called me back and after reviewing patient's records he does not feel that HD is imminent; perhaps elevated K+ is related to diet or meds. He does not wish the patient to have a TDC at this time. We will proceed with AVF as planned on 08-02-2018

## 2018-07-31 NOTE — Progress Notes (Addendum)
Anesthesia Chart Review: SAME DAY WORKUP   Case:  [id number] Date/Time:  08/02/18 1225   Procedure:  ARTERIOVENOUS (AV) FISTULA CREATION LEFT ARM (Left )   Anesthesia type:  Monitor Anesthesia Care   Pre-op diagnosis:  CHRONIC KIDNEY DISEASE   Location:  [facility] OR ROOM 12 / [place] OR   Surgeon:  Rosetta Posner, MD      DISCUSSION: 73 yo female never smoker. Pertinent hx includes IDDMII, OSA not on CPAP, Anemia, HTN, chronic combined HF CKD IV not yet on HD, psteomyelitis in the right foot s/p right BKA  Per PAT nurse phone call, pt's K+ 6.3 on 3/2 per SNF. She was treated with Kayexelate. Zigmund Daniel from [facility] called pt's nephrologist, Dr. Lowanda Foster to make them aware of hyperkalemia and was advised that it was likely dietary or med related, should respond to [facility], and did not need to have [facility] placed to start HD at this time.  Pt has a history of PSVT and chronic diastolic and systolic HF. Last seen by cardiology 06/09/2017. Last echo 12/21/16 showed normalization of heart function with EF 60-65%, normal wall motion, grade 2 dd, mild MR, moderate TR.  Discussed case with Dr. Linna Caprice. He advised that as long as pt is euvolemic and DOS labs acceptable she can proceed as planned.   Addendum 08/01/18: Pt presented to [facility] ED 07/31/18 due to abnormal labs and SOB. She was found to have pulmonary edema and was admitted for further management. I called [facility] to make them aware.   [facility]: LMP 07/25/2011   PROVIDERS: Pablo Ledger, MD is PCP  Kerry Hough, MD is Cardiologist  Fran Lowes, MD is Nephrologist  LABS: Will need DOS labs  Labs Reviewed - No data to display   IMAGES: CHEST - 2 VIEW 07/31/18  COMPARISON:  May 31, 2018  FINDINGS: Cardiomegaly. Mild to moderate pulmonary edema. No other interval changes or acute abnormalities.  IMPRESSION: Cardiomegaly and mild to moderate pulmonary edema.  EKG: 07/31/2018: Sinus rhythm. Rate 62.  CV: TTE 12/21/2016: Study Conclusions  -  Left ventricle: The cavity size was normal. Wall thickness was   increased in a pattern of moderate LVH. Systolic function was   normal. The estimated ejection fraction was in the range of 60%   to 65%. Wall motion was normal; there were no regional wall   motion abnormalities. Features are consistent with a pseudonormal   left ventricular filling pattern, with concomitant abnormal   relaxation and increased filling pressure (grade 2 diastolic   dysfunction). Doppler parameters are consistent with high   ventricular filling pressure. - Aortic valve: Valve area (VTI): 2.48 cm^2. Valve area (Vmax):   2.39 cm^2. Valve area (Vmean): 2.31 cm^2. - Mitral valve: There was mild regurgitation. - Left atrium: The atrium was moderately to severely dilated. - Right atrium: The atrium was mildly dilated. - Atrial septum: No defect or patent foramen ovale was identified. - Tricuspid valve: There was moderate regurgitation. - Pulmonary arteries: Systolic pressure was moderately increased.   PA peak pressure: 44 mm Hg (S). - Systemic veins: IVC is dilated, with normal respiratory   variation. Estimated RA pressure is 8 mmHg. - Pericardium, extracardiac: There is a small circumferential   pericardial effusion. No evidence of tamponade physiology. - Technically adequate study.    Past Medical History:  Diagnosis Date  . Anemia   . Arthritis    knees  . Cellulitis and abscess of foot 02/12/2012  . CHF (congestive heart failure) ([place])   .  Complication of anesthesia    pt states after her hysterectomy the doctor said she was "wild" when she woke up   . Diabetes mellitus   . Diabetes mellitus with neuropathy 07/25/2011  . Dysrhythmia    tachy- takes Metoprolol  . Hypertension   . OSA (obstructive sleep apnea) 07/25/2011   not using CPAP  . Osteomyelitis of right foot ([place]) 02/14/2012  . Partial Achilles tendon tear 02/14/2012  . Pneumonia   . Restless leg syndrome 07/25/2011  . Restless leg  syndrome   . Wound, open    right foot    Past Surgical History:  Procedure Laterality Date  . ABDOMINAL HYSTERECTOMY    . ACHILLES TENDON REPAIR    . AMPUTATION Right 11/25/2012   Procedure: AMPUTATION BELOW KNEE;  Surgeon: Newt Minion, MD;  Location: [place];  Service: Orthopedics;  Laterality: Right;  Right Below Knee Amputation  . BELOW KNEE LEG AMPUTATION Right 11/25/2012   Dr Sharol Given  . BREAST SURGERY Left    Lumpectomy non ca  . CATARACT EXTRACTION W/PHACO Right 10/09/2013   Procedure: CATARACT EXTRACTION PHACO AND INTRAOCULAR LENS PLACEMENT (IOC);  Surgeon: Elta Guadeloupe T. Gershon Crane, MD;  Location: [facility];  Service: Ophthalmology;  Laterality: Right;  CDE:9.05  . CATARACT EXTRACTION W/PHACO Left 10/23/2013   Procedure: ATTEMPTED CATARACT EXTRACTION PHACO AND INTRAOCULAR LENS PLACEMENT;  Surgeon: Elta Guadeloupe T. Gershon Crane, MD;  Location: [facility];  Service: Ophthalmology;  Laterality: Left;  CDE:  4.41  . TOE AMPUTATION     partial rt great toe    MEDICATIONS: No current facility-administered medications for this encounter.    Marland Kitchen amLODipine (NORVASC) 10 MG tablet  . calcium acetate (PHOSLO) 667 MG capsule  . diazepam (VALIUM) 2 MG tablet  . doxazosin (CARDURA) 4 MG tablet  . epoetin alfa-epbx (RETACRIT) 4000 UNIT/ML injection  . ferrous sulfate 325 (65 FE) MG tablet  . gabapentin (NEURONTIN) 600 MG tablet  . guaiFENesin-dextromethorphan (ROBITUSSIN DM) 100-10 MG/5ML syrup  . insulin detemir (LEVEMIR) 100 UNIT/ML injection  . insulin NPH-regular Human (70-30) 100 UNIT/ML injection  . magnesium oxide (MAG-OX) 400 MG tablet  . metoprolol tartrate (LOPRESSOR) 100 MG tablet  . Multiple Vitamin (MULTIVITAMIN) tablet  . oxyCODONE-acetaminophen (PERCOCET) 7.5-325 MG tablet  . promethazine (PHENERGAN) 25 MG tablet  . rOPINIRole (REQUIP) 2 MG tablet  . sertraline (ZOLOFT) 50 MG tablet  . tiZANidine (ZANAFLEX) 4 MG tablet  . torsemide (DEMADEX) 20 MG tablet   Wynonia Musty [facility] Short Stay  Center/Anesthesiology Phone [phone] 08/01/2018 10:44 AM

## 2018-07-31 NOTE — Telephone Encounter (Signed)
Received a Call from Jan at [place]; According to nurse at [facility], this patient's K+ is over 6 and they are giving her Kayexalate. I called Ramiro Harvest, PA at Dr. Florentina Addison office to see if we needed to place a [facility] along with our AVF surgery that is planned for Wednesday 08-02-2018. Juanda Crumble is going to call me back after he consults with Dr. Lowanda Foster. I told him that I would not add this posting until he calls me back.

## 2018-07-31 NOTE — Pre-Procedure Instructions (Signed)
    [name]  07/31/2018      Procedure is scheduled on Wednesday, March 4.  Report to [facility] Admitting at 10: 10 AM                     Surgery or procedure is scheduled for 12:40 PM               For questions the morning of surgery call [phone]  This is the number for the Pre- Surgical Desk.   >>>>>Please send patient's Medication Record with medications administrated documentation. ( this information is required prior to OR. This includes medications that may have been on hold for surgery)<<<<<  Tuesday evening give 1/2 dose of Levemir- 10 units of Levemir  Wednesday- Do NOT give any 70/30 Insulin  ---if CBG is greater than 220- call the pre- op desk and ask to speak to a nurse or further instructions    Remember:  Do not eat or drink after midnight Tuesday, March 3.                      Take these medicines the morning of surgery with A SIP OF WATER : Amlodipine Valium Doxazosin Gabapentin Metoprolol Sertraline Tizanidine  If needed: Phenergan Oxycodone   Day of surgery - Check blood sugar when she awakens and every 2 hours prior to arriving at the hospital         If  blood sugar is less than 70 mg/dL, you will need to treat for low blood sugar:  o Treat a low blood sugar (less than 70 mg/dL) 4 glucose tablets, OR glucose gel. Recheck blood sugar in 15 minutes after treatment (to make sure it is greater than 70 mg/dL). If your blood sugar is not greater than 70 mg/dL on recheck, call [phone] for further instructions.  . Report blood sugar to the short stay nurse when you get to Short Stay.       Do not wear jewelry, make-up or nail polish.  Do not wear lotions, powders, or perfumes, or deodorant.  Do not shave 48 hours prior to surgery.  Men may shave face and neck.  Do not bring valuables to the hospital.  [facility] is not responsible for any belongings or valuables.  Contacts, dentures or bridgework may not be worn into  surgery.  Leave your suitcase in the car.  After surgery it may be brought to your room.   Instructions are from [facility] Dr Roderic Palau is the contact physician today

## 2018-07-31 NOTE — H&P (Signed)
History and Physical    Mary [name] EVO:[id number] DOB: [birth year] DOA: 07/31/2018  PCP: Pablo Ledger, MD   Patient coming from: [facility], [place]  I have personally briefly reviewed patient's old medical records in [place]  Chief Complaint: SOB  HPI: Mary [name] is a 73 y.o. female with medical history significant for diastolic CHF, DM, OSA, right below knee amputation, peripheral vascular disease, obesity, who presented to the ED with complaints of worsening difficulty breathing especially when she is lying flat.  She reports abdominal distention, and increase in size of both breasts.  She reports recent weight gain.  Reports her weight is checked every 2 days, her baseline weight is 215 and she is now up to 245. She denies chest pain or cough.  No fevers or chills.  No vomiting or loose stools.  She reports the dose of her diuretic has been increased but still she is not had any change in urine output.  She reports she is being given a low-salt diet.  Patient had blood work done that showed potassium of 6.3.  Patient was given 30 g Kayexalate.. Patient's nephrologist was contacted.    ED Course: Blood pressure systolic 299B to 716.  O2 sats greater than 92% on room air.  Potassium was 5.6.  Creatinine elevated 3.45 (result at bedside from rehab facility shows recent creatinine 2.8-2.9 06/08/2018.  X-ray shows mild to moderate pulmonary edema.   BNP 381, prior elevations to 1600.  EKG shows sinus rhythm, without ST or T wave abnormalities.  Patient was given IV Lasix 80 mg x 1.  Hospitalist to admit for acute CHF exacerbation.  Review of Systems: As per HPI all other systems reviewed and negative.  Past Medical History:  Diagnosis Date  . Anemia   . Arthritis    knees  . Cellulitis and abscess of foot 02/12/2012  . CHF (congestive heart failure) ([place])   . Complication of anesthesia    pt states after her hysterectomy the doctor said she was "wild" when she woke  up   . Diabetes mellitus   . Diabetes mellitus with neuropathy 07/25/2011  . Dysrhythmia    tachy- takes Metoprolol  . Hypertension   . OSA (obstructive sleep apnea) 07/25/2011   not using CPAP  . Osteomyelitis of right foot ([place]) 02/14/2012  . Partial Achilles tendon tear 02/14/2012  . Pneumonia   . Restless leg syndrome 07/25/2011  . Restless leg syndrome   . Wound, open    right foot    Past Surgical History:  Procedure Laterality Date  . ABDOMINAL HYSTERECTOMY    . ACHILLES TENDON REPAIR    . AMPUTATION Right 11/25/2012   Procedure: AMPUTATION BELOW KNEE;  Surgeon: Newt Minion, MD;  Location: [place];  Service: Orthopedics;  Laterality: Right;  Right Below Knee Amputation  . BELOW KNEE LEG AMPUTATION Right 11/25/2012   Dr Sharol Given  . BREAST SURGERY Left    Lumpectomy non ca  . CATARACT EXTRACTION W/PHACO Right 10/09/2013   Procedure: CATARACT EXTRACTION PHACO AND INTRAOCULAR LENS PLACEMENT (IOC);  Surgeon: Elta Guadeloupe T. Gershon Crane, MD;  Location[facility];  Service: Ophthalmology;  Laterality: Right;  CDE:9.05  . CATARACT EXTRACTION W/PHACO Left 10/23/2013   Procedure: ATTEMPTED CATARACT EXTRACTION PHACO AND INTRAOCULAR LENS PLACEMENT;  Surgeon: Elta Guadeloupe T. Gershon Crane, MD;  Location: [facility];  Service: Ophthalmology;  Laterality: Left;  CDE:  4.41  . TOE AMPUTATION     partial rt great toe  reports that she has never smoked. She has never used smokeless tobacco. She reports that she does not drink alcohol or use drugs.  No Known Allergies  Family History  Problem Relation Age of Onset  . Diabetes Sister     Prior to Admission medications   Medication Sig Start Date End Date Taking? Authorizing Provider  amLODipine (NORVASC) 10 MG tablet Take 10 mg by mouth daily.   Yes [provider]  calcium acetate (PHOSLO) 667 MG capsule Take 667 mg by mouth 3 (three) times daily with meals.   Yes [provider]  diazepam (VALIUM) 2 MG tablet Take 2 mg by mouth 2 (two) times daily.   Yes  [provider]  doxazosin (CARDURA) 4 MG tablet Take 4 mg by mouth at bedtime.    Yes [provider]  epoetin alfa-epbx (RETACRIT) 4000 UNIT/ML injection Inject 4,000 Units into the skin every 14 (fourteen) days.    Yes [provider]  ferrous sulfate 325 (65 FE) MG tablet Take 325 mg by mouth daily.    Yes [provider]  gabapentin (NEURONTIN) 600 MG tablet Take 600 mg by mouth 3 (three) times daily.   Yes [provider]  guaiFENesin-dextromethorphan (ROBITUSSIN DM) 100-10 MG/5ML syrup Take 10 mLs by mouth every 4 (four) hours as needed for cough.   Yes [provider]  insulin detemir (LEVEMIR) 100 UNIT/ML injection Inject 20 Units into the skin at bedtime.   Yes [provider]  insulin NPH-regular Human (70-30) 100 UNIT/ML injection Inject 28 Units into the skin daily. Hold if BS is less than 200   Yes [provider]  magnesium oxide (MAG-OX) 400 MG tablet Take 400 mg by mouth daily.   Yes [provider]  metoprolol tartrate (LOPRESSOR) 100 MG tablet Take 100 mg by mouth 2 (two) times daily.   Yes [provider]  Multiple Vitamin (MULTIVITAMIN) tablet Take 1 tablet by mouth daily.   Yes [provider]  oxyCODONE-acetaminophen (PERCOCET) 7.5-325 MG tablet Take 1 tablet by mouth 3 (three) times daily.   Yes [provider]  rOPINIRole (REQUIP) 2 MG tablet Take 4 mg by mouth at bedtime.  06/09/17  Yes [provider]  sertraline (ZOLOFT) 50 MG tablet Take 50 mg by mouth daily.   Yes [provider]  tiZANidine (ZANAFLEX) 4 MG tablet Take 4 mg by mouth 3 (three) times daily.   Yes [provider]  torsemide (DEMADEX) 20 MG tablet Take 30 mg by mouth daily.    Yes [provider]  Vitamin D, Ergocalciferol, (DRISDOL) 1.25 MG (50000 UT) CAPS capsule Take 50,000 Units by mouth every Monday.   Yes [provider]  promethazine (PHENERGAN) 25 MG  tablet Take 25 mg by mouth every 4 (four) hours as needed for nausea or vomiting.    [provider]    Physical Exam: Vitals:   07/31/18 1745 07/31/18 1800 07/31/18 1815 07/31/18 1830  BP:    (!) 158/62  Pulse: 72 72 71 68  Resp: 16 15 (!) 21 12  Temp:      TempSrc:      SpO2: 98% 98% 100% 100%  Weight:      Height:        Constitutional: NAD, calm, comfortable Vitals:   07/31/18 1745 07/31/18 1800 07/31/18 1815 07/31/18 1830  BP:    (!) 158/62  Pulse: 72 72 71 68  Resp: 16 15 (!) 21 12  Temp:  TempSrc:      SpO2: 98% 98% 100% 100%  Weight:      Height:       Eyes: PERRL, lids and conjunctivae normal ENMT: Mucous membranes are dry. Posterior pharynx clear of any exudate or lesions.  Neck: normal, supple, no masses, no thyromegaly Respiratory: Crackles bilateral bases, no wheezing. Normal respiratory effort. No accessory muscle use.  Cardiovascular: Regular rate and rhythm, no murmurs / rubs / gallops. No extremity edema- left. 2+ pedal pulses. No carotid bruits.  Abdomen: obese abdomen, no tenderness, no masses palpated. No hepatosplenomegaly. Bowel sounds positive.  Musculoskeletal: no clubbing / cyanosis. Right BKA. Good ROM, no contractures. Normal muscle tone.  Skin: no rashes, lesions, ulcers. No induration Neurologic: CN 2-12 grossly intact. Moving all extremities spontaneously Psychiatric: Normal judgment and insight. Alert and oriented x 3. Normal mood.   Labs on Admission: I have personally reviewed following labs and imaging studies  CBC: Recent Labs  Lab 07/31/18 1700  WBC 6.2  NEUTROABS 4.4  HGB 8.6*  HCT 27.9*  MCV 93.3  PLT 502   Basic Metabolic Panel: Recent Labs  Lab 07/31/18 1700  NA 138  K 5.6*  CL 105  CO2 24  GLUCOSE 162*  BUN 80*  CREATININE 3.45*  CALCIUM 8.0*  MG 2.4  PHOS 6.0*    Radiological Exams on Admission: Dg Chest 2 View  Result Date: 07/31/2018 CLINICAL DATA:  Shortness of breath for a few days. EXAM:  CHEST - 2 VIEW COMPARISON:  May 31, 2018 FINDINGS: Cardiomegaly. Mild to moderate pulmonary edema. No other interval changes or acute abnormalities. IMPRESSION: Cardiomegaly and mild to moderate pulmonary edema. Electronically Signed   By: Dorise Bullion III M.D   On: 07/31/2018 18:14    EKG: Independently reviewed.  Sinus rhythm.  QTc 456.  No significant ST or T wave abnormalities.,  EKG unchanged from prior.  Assessment/Plan Active Problems:   CHF exacerbation (HCC)  Acute decompensated diastolic CHF-shortness of breath, weight gain-reported 30lbs per pt, last weight in epic 08/21/17-  208 or 234, ??hard to tell.  BNP not elevated compared to prior.  Two-view chest x-ray shows mild to moderate edema.  On room air.  Reports no response to increased dose diuretic.  Last echo 11/2016- EF 60 to 65%, G2DD.  IV Lasix 801 given in ED -Continue IV Lasix at 60 mg twice daily -BMP a.m. -Strict Input output, daily weights -Trend troponin. -Updated echocardiogram  Acute kidney injury-creatinine 3.45, recent baseline 06/08/18-2.8- 2.9.  Presentation suggest volume overload.  Likely cardiorenal. -Diurese -BMP a.m.  Systolic HTN-elevated 774J to 180.   -Continue home Norvasc, doxazosin, metoprolol -PRN hydralazine  Right BKA, Restless legs, chronic pain-  -Continue home Valium, tizanidine, ropinirole, PRN percocet  DM-glucose 162. - SSI -New home Lantus at reduced dose 15 units nightly  OSA-  - CPAP QHS   DVT prophylaxis: heparin Code Status: Full Family Communication: None at bedside Disposition Plan: Per rounding team Consults called: None Admission status: Observation, telemetry   Bethena Roys MD [facility]  07/31/2018, 7:04 PM

## 2018-07-31 NOTE — ED Notes (Signed)
ED TO INPATIENT HANDOFF REPORT  ED Nurse Name and Phone #:   Leverne Humbles RN [phone] Name/Age/Gender Mary [name] 73 y.o. female Room/Bed: APA06/APA06  Code Status   Code Status: Prior  Home/SNF/Other [place] [facility] Patient oriented to: self, place, time and situation Is this baseline? Yes   Triage Complete: Triage complete  Chief Complaint High Potassium  Triage Note Pt states she has been very short of breath for the past few days and they have been giving her IM lasix for CHF.  Today had labs done and she is in ARF with K+ of 6.5.     Allergies No Known Allergies  Level of Care/Admitting Diagnosis ED Disposition    ED Disposition Condition [facility] Area: [facility] [[id number]]  Level of Care: Telemetry [5]  Diagnosis: CHF exacerbation [facility]) [[id number]]  Admitting Physician: Bethena Roys [phone]  Attending Physician: Bethena Roys Nessa.Cuff  PT Class (Do Not Modify): Observation [104]  PT Acc Code (Do Not Modify): Observation [10022]       B Medical/Surgery History Past Medical History:  Diagnosis Date  . Anemia   . Arthritis    knees  . Cellulitis and abscess of foot 02/12/2012  . CHF (congestive heart failure) ([place])   . Complication of anesthesia    pt states after her hysterectomy the doctor said she was "wild" when she woke up   . Diabetes mellitus   . Diabetes mellitus with neuropathy 07/25/2011  . Dysrhythmia    tachy- takes Metoprolol  . Hypertension   . OSA (obstructive sleep apnea) 07/25/2011   not using CPAP  . Osteomyelitis of right foot ([place]) 02/14/2012  . Partial Achilles tendon tear 02/14/2012  . Pneumonia   . Restless leg syndrome 07/25/2011  . Restless leg syndrome   . Wound, open    right foot   Past Surgical History:  Procedure Laterality Date  . ABDOMINAL HYSTERECTOMY    . ACHILLES TENDON REPAIR    . AMPUTATION Right 11/25/2012   Procedure: AMPUTATION BELOW KNEE;  Surgeon: Newt Minion, MD;  Location: [place];  Service: Orthopedics;  Laterality: Right;  Right Below Knee Amputation  . BELOW KNEE LEG AMPUTATION Right 11/25/2012   Dr Sharol Given  . BREAST SURGERY Left    Lumpectomy non ca  . CATARACT EXTRACTION W/PHACO Right 10/09/2013   Procedure: CATARACT EXTRACTION PHACO AND INTRAOCULAR LENS PLACEMENT (IOC);  Surgeon: Elta Guadeloupe T. Gershon Crane, MD;  Location: [facility];  Service: Ophthalmology;  Laterality: Right;  CDE:9.05  . CATARACT EXTRACTION W/PHACO Left 10/23/2013   Procedure: ATTEMPTED CATARACT EXTRACTION PHACO AND INTRAOCULAR LENS PLACEMENT;  Surgeon: Elta Guadeloupe T. Gershon Crane, MD;  Location: [facility];  Service: Ophthalmology;  Laterality: Left;  CDE:  4.41  . TOE AMPUTATION     partial rt great toe     A IV Location/Drains/Wounds Patient Lines/Drains/Airways Status   Active Line/Drains/Airways    Name:   Placement date:   Placement time:   Site:   Days:   Peripheral IV 07/31/18 Right Antecubital   07/31/18    1723    Antecubital   less than 1   Peripheral IV 07/31/18 Left Hand   07/31/18    1800    Hand   less than 1   Incision 11/25/12 Leg Right   11/25/12    1042     2074   Incision (Closed) 10/09/13 Eye Right   10/09/13    0825  1756   Incision (Closed) 10/23/13 Eye Left   10/23/13    0811     1742   Pressure Injury 08/20/17 Stage II -  Partial thickness loss of dermis presenting as a shallow open ulcer with a red, pink wound bed without slough. no drainage; quarter sized   08/20/17    2235     345   Pressure Injury 08/20/17 Stage I -  Intact skin with non-blanchable redness of a localized area usually over a bony prominence. From rubbing of prostetic leg on stump   08/20/17    2245     345   Wound 02/12/12 Diabetic ulcer Heel Posterior;Left Large open wound, foul smell, beefy red tissue with serosangious drainage.     02/12/12    2034    Heel   2361   Wound 10/08/12 Diabetic ulcer Heel Right 3cmx4cm Diabetic ulcer   10/08/12    0015    Heel   2122   Wound 10/08/12 Other (Comment) Toe  (Comment  which one) Right scabbed area - healing blister from wearing cast previously   10/08/12    0740    Toe (Comment  which one)   2122   Wound / Incision (Open or Dehisced) 09/21/13 Other (Comment) Other (Comment) Left Lt anterior toe   09/21/13    1540    Other (Comment)   1774          Intake/Output Last 24 hours No intake or output data in the 24 hours ending 07/31/18 2002  Labs/Imaging Results for orders placed or performed during the hospital encounter of 07/31/18 (from the past 48 hour(s))  Basic metabolic panel     Status: Abnormal   Collection Time: 07/31/18  5:00 PM  Result Value Ref Range   Sodium 138 135 - 145 mmol/L   Potassium 5.6 (H) 3.5 - 5.1 mmol/L   Chloride 105 98 - 111 mmol/L   CO2 24 22 - 32 mmol/L   Glucose, Bld 162 (H) 70 - 99 mg/dL   BUN 80 (H) 8 - 23 mg/dL   Creatinine, Ser 3.45 (H) 0.44 - 1.00 mg/dL   Calcium 8.0 (L) 8.9 - 10.3 mg/dL   GFR calc non Af Amer 13 (L) >60 mL/min   GFR calc Af Amer 15 (L) >60 mL/min   Anion gap 9 5 - 15    Comment: Performed at [facility], [address] [place], [place] [address]  CBC with Differential     Status: Abnormal   Collection Time: 07/31/18  5:00 PM  Result Value Ref Range   WBC 6.2 4.0 - 10.5 K/uL   RBC 2.99 (L) 3.87 - 5.11 MIL/uL   Hemoglobin 8.6 (L) 12.0 - 15.0 g/dL   HCT 27.9 (L) 36.0 - 46.0 %   MCV 93.3 80.0 - 100.0 fL   MCH 28.8 26.0 - 34.0 pg   MCHC 30.8 30.0 - 36.0 g/dL   RDW 15.0 11.5 - 15.5 %   Platelets 190 150 - 400 K/uL   nRBC 0.0 0.0 - 0.2 %   Neutrophils Relative % 71 %   Neutro Abs 4.4 1.7 - 7.7 K/uL   Lymphocytes Relative 14 %   Lymphs Abs 0.8 0.7 - 4.0 K/uL   Monocytes Relative 9 %   Monocytes Absolute 0.6 0.1 - 1.0 K/uL   Eosinophils Relative 5 %   Eosinophils Absolute 0.3 0.0 - 0.5 K/uL   Basophils Relative 1 %   Basophils Absolute 0.0 0.0 -  0.1 K/uL   Immature Granulocytes 0 %   Abs Immature Granulocytes 0.02 0.00 - 0.07 K/uL    Comment: Performed at [facility],  [address] [place], [place] [address]  Brain natriuretic peptide     Status: Abnormal   Collection Time: 07/31/18  5:00 PM  Result Value Ref Range   B Natriuretic Peptide 381.0 (H) 0.0 - 100.0 pg/mL    Comment: Performed at [facility], [address], [place], [place] [address]  Magnesium     Status: None   Collection Time: 07/31/18  5:00 PM  Result Value Ref Range   Magnesium 2.4 1.7 - 2.4 mg/dL    Comment: Performed at [facility], [address] [place], [place] [address]  Phosphorus     Status: Abnormal   Collection Time: 07/31/18  5:00 PM  Result Value Ref Range   Phosphorus 6.0 (H) 2.5 - 4.6 mg/dL    Comment: Performed at [facility], [address], [place], [place] [address]   Dg Chest 2 View  Result Date: 07/31/2018 CLINICAL DATA:  Shortness of breath for a few days. EXAM: CHEST - 2 VIEW COMPARISON:  May 31, 2018 FINDINGS: Cardiomegaly. Mild to moderate pulmonary edema. No other interval changes or acute abnormalities. IMPRESSION: Cardiomegaly and mild to moderate pulmonary edema. Electronically Signed   By: Dorise Bullion III M.D   On: 07/31/2018 18:14    Pending Labs Unresulted Labs (From admission, onward)    Start     Ordered   Signed and Occupational hygienist morning,   R     Signed and Held   Signed and Held  CBC  Tomorrow morning,   R     Signed and Held          Vitals/Pain Today's Vitals   07/31/18 1800 07/31/18 1815 07/31/18 1830 07/31/18 1930  BP:   (!) 158/62 (!) 155/142  Pulse: 72 71 68 64  Resp: 15 (!) 21 12 14   Temp:      TempSrc:      SpO2: 98% 100% 100% 99%  Weight:      Height:      PainSc:        Isolation Precautions No active isolations  Medications Medications  dextrose 50 % solution (has no administration in time range)  calcium gluconate inj 10% (1 g) URGENT USE ONLY! (1 g Intravenous Given 07/31/18 1724)  insulin aspart (novoLOG) injection 5 Units (5 Units Intravenous Given 07/31/18 1735)    And  dextrose 50 %  solution 50 mL (50 mLs Intravenous Given 07/31/18 1715)  sodium bicarbonate injection 50 mEq (50 mEq Intravenous Given 07/31/18 1715)  rOPINIRole (REQUIP) tablet 2 mg (2 mg Oral Given 07/31/18 1818)  diazepam (VALIUM) tablet 2 mg (2 mg Oral Given 07/31/18 1809)  furosemide (LASIX) injection 80 mg (80 mg Intravenous Given 07/31/18 1958)    Mobility walks with device  High fall risk   Focused Assessments Pulmonary Assessment Handoff:  Lung sounds: L Breath Sounds: Diminished, Coarse crackles R Breath Sounds: Diminished, Coarse crackles O2 Device: Room Air        R Recommendations: See Admitting Provider Note  Report given to:   Additional Notes:

## 2018-07-31 NOTE — Progress Notes (Addendum)
I spoke to Mrs Rief's nurse, Donia Pounds.  Patient had labs drawn and Potassium was 6.3.  Donia Pounds is going to call the NP for the SNF and will call back.  I spoke to [place], Mrs Bamba's nurse, she reported that the NP ordered for patient to have 30 grams kayexalate. I asked if labs would be repeated, "they were not ordered, we send them out so they would not be back before patient arrives at the hospital."  Donia Pounds also said that NP was going to speak to the Dr. for [facility]. I have informed Karoline Caldwell, PA-C and Jacqlyn Larsen, RN at [facility].  Donia Pounds reported that CBG's run 140's - 200.  Patient's SS Insulin starts over 200, "patient has not received 70/30 SS coverage in a while. I sent Donia Pounds  pre procedure instructions.  I reviewed instructions with Donia Pounds includingto only give 10 units of Levemir the evening before surgery.  Call pre- surgery desk if CBG . 220 on 07/04/2018. Donia Pounds said that they have glucose gel to treat low CBG's.  Zigmund Daniel, RN called back, she spoke with Dr. Florentina Addison PA, he reviewed patient past labs and feels that kayexalate should correct the potassium, 'probably something dietary caused the rise.'

## 2018-08-01 ENCOUNTER — Inpatient Hospital Stay (HOSPITAL_COMMUNITY): Payer: Medicare Other

## 2018-08-01 ENCOUNTER — Encounter (HOSPITAL_COMMUNITY): Payer: Self-pay | Admitting: Family Medicine

## 2018-08-01 ENCOUNTER — Other Ambulatory Visit (HOSPITAL_COMMUNITY): Payer: Self-pay

## 2018-08-01 DIAGNOSIS — I340 Nonrheumatic mitral (valve) insufficiency: Secondary | ICD-10-CM

## 2018-08-01 DIAGNOSIS — R296 Repeated falls: Secondary | ICD-10-CM | POA: Diagnosis present

## 2018-08-01 DIAGNOSIS — N2581 Secondary hyperparathyroidism of renal origin: Secondary | ICD-10-CM | POA: Diagnosis present

## 2018-08-01 DIAGNOSIS — Z89511 Acquired absence of right leg below knee: Secondary | ICD-10-CM | POA: Diagnosis not present

## 2018-08-01 DIAGNOSIS — N185 Chronic kidney disease, stage 5: Secondary | ICD-10-CM | POA: Diagnosis present

## 2018-08-01 DIAGNOSIS — E1151 Type 2 diabetes mellitus with diabetic peripheral angiopathy without gangrene: Secondary | ICD-10-CM | POA: Diagnosis present

## 2018-08-01 DIAGNOSIS — G2581 Restless legs syndrome: Secondary | ICD-10-CM | POA: Diagnosis present

## 2018-08-01 DIAGNOSIS — N189 Chronic kidney disease, unspecified: Secondary | ICD-10-CM

## 2018-08-01 DIAGNOSIS — I361 Nonrheumatic tricuspid (valve) insufficiency: Secondary | ICD-10-CM

## 2018-08-01 DIAGNOSIS — R0989 Other specified symptoms and signs involving the circulatory and respiratory systems: Secondary | ICD-10-CM | POA: Diagnosis present

## 2018-08-01 DIAGNOSIS — I132 Hypertensive heart and chronic kidney disease with heart failure and with stage 5 chronic kidney disease, or end stage renal disease: Secondary | ICD-10-CM | POA: Diagnosis present

## 2018-08-01 DIAGNOSIS — Z833 Family history of diabetes mellitus: Secondary | ICD-10-CM | POA: Diagnosis not present

## 2018-08-01 DIAGNOSIS — F419 Anxiety disorder, unspecified: Secondary | ICD-10-CM | POA: Diagnosis present

## 2018-08-01 DIAGNOSIS — M898X9 Other specified disorders of bone, unspecified site: Secondary | ICD-10-CM | POA: Diagnosis present

## 2018-08-01 DIAGNOSIS — G4733 Obstructive sleep apnea (adult) (pediatric): Secondary | ICD-10-CM | POA: Diagnosis present

## 2018-08-01 DIAGNOSIS — I5033 Acute on chronic diastolic (congestive) heart failure: Secondary | ICD-10-CM | POA: Diagnosis present

## 2018-08-01 DIAGNOSIS — D509 Iron deficiency anemia, unspecified: Secondary | ICD-10-CM | POA: Diagnosis present

## 2018-08-01 DIAGNOSIS — I10 Essential (primary) hypertension: Secondary | ICD-10-CM | POA: Diagnosis not present

## 2018-08-01 DIAGNOSIS — E1140 Type 2 diabetes mellitus with diabetic neuropathy, unspecified: Secondary | ICD-10-CM | POA: Diagnosis present

## 2018-08-01 DIAGNOSIS — E875 Hyperkalemia: Secondary | ICD-10-CM

## 2018-08-01 DIAGNOSIS — Z794 Long term (current) use of insulin: Secondary | ICD-10-CM | POA: Diagnosis not present

## 2018-08-01 DIAGNOSIS — D631 Anemia in chronic kidney disease: Secondary | ICD-10-CM | POA: Diagnosis present

## 2018-08-01 DIAGNOSIS — E669 Obesity, unspecified: Secondary | ICD-10-CM | POA: Diagnosis present

## 2018-08-01 DIAGNOSIS — Z6841 Body Mass Index (BMI) 40.0 and over, adult: Secondary | ICD-10-CM | POA: Diagnosis not present

## 2018-08-01 DIAGNOSIS — G8929 Other chronic pain: Secondary | ICD-10-CM | POA: Diagnosis present

## 2018-08-01 DIAGNOSIS — E1122 Type 2 diabetes mellitus with diabetic chronic kidney disease: Secondary | ICD-10-CM | POA: Diagnosis present

## 2018-08-01 DIAGNOSIS — N179 Acute kidney failure, unspecified: Secondary | ICD-10-CM | POA: Diagnosis present

## 2018-08-01 LAB — RENAL FUNCTION PANEL
Albumin: 3.1 g/dL — ABNORMAL LOW (ref 3.5–5.0)
Anion gap: 10 (ref 5–15)
BUN: 81 mg/dL — ABNORMAL HIGH (ref 8–23)
CO2: 27 mmol/L (ref 22–32)
Calcium: 8 mg/dL — ABNORMAL LOW (ref 8.9–10.3)
Chloride: 102 mmol/L (ref 98–111)
Creatinine, Ser: 3.37 mg/dL — ABNORMAL HIGH (ref 0.44–1.00)
GFR calc Af Amer: 15 mL/min — ABNORMAL LOW (ref >60)
GFR calc non Af Amer: 13 mL/min — ABNORMAL LOW (ref >60)
Glucose, Bld: 153 mg/dL — ABNORMAL HIGH (ref 70–99)
Phosphorus: 6.1 mg/dL — ABNORMAL HIGH (ref 2.5–4.6)
Potassium: 4.8 mmol/L (ref 3.5–5.1)
Sodium: 139 mmol/L (ref 135–145)

## 2018-08-01 LAB — GLUCOSE, CAPILLARY
Glucose-Capillary: 120 mg/dL — ABNORMAL HIGH (ref 70–99)
Glucose-Capillary: 195 mg/dL — ABNORMAL HIGH (ref 70–99)
Glucose-Capillary: 139 mg/dL — ABNORMAL HIGH (ref 70–99)
Glucose-Capillary: 148 mg/dL — ABNORMAL HIGH (ref 70–99)

## 2018-08-01 LAB — MRSA PCR SCREENING: MRSA by PCR: POSITIVE — AB

## 2018-08-01 LAB — TROPONIN I
Troponin I: <0.03 ng/mL (ref <0.03)
Troponin I: <0.03 ng/mL (ref <0.03)

## 2018-08-01 LAB — ECHOCARDIOGRAM COMPLETE
Height: 70 in
Weight: 3985.92 oz

## 2018-08-01 LAB — CBC
HEMATOCRIT: 25.1 % — AB (ref 36.0–46.0)
Hemoglobin: 7.6 g/dL — ABNORMAL LOW (ref 12.0–15.0)
MCH: 28.6 pg (ref 26.0–34.0)
MCHC: 30.3 g/dL (ref 30.0–36.0)
MCV: 94.4 fL (ref 80.0–100.0)
Platelets: 169 10*3/uL (ref 150–400)
RBC: 2.66 MIL/uL — ABNORMAL LOW (ref 3.87–5.11)
RDW: 15.1 % (ref 11.5–15.5)
WBC: 6.7 10*3/uL (ref 4.0–10.5)
nRBC: 0 % (ref 0.0–0.2)

## 2018-08-01 LAB — BASIC METABOLIC PANEL
Anion gap: 8 (ref 5–15)
BUN: 83 mg/dL — ABNORMAL HIGH (ref 8–23)
CHLORIDE: 106 mmol/L (ref 98–111)
CO2: 26 mmol/L (ref 22–32)
Calcium: 7.8 mg/dL — ABNORMAL LOW (ref 8.9–10.3)
Creatinine, Ser: 3.35 mg/dL — ABNORMAL HIGH (ref 0.44–1.00)
GFR calc Af Amer: 15 mL/min — ABNORMAL LOW (ref >60)
GFR calc non Af Amer: 13 mL/min — ABNORMAL LOW (ref >60)
Glucose, Bld: 151 mg/dL — ABNORMAL HIGH (ref 70–99)
Potassium: 5.3 mmol/L — ABNORMAL HIGH (ref 3.5–5.1)
SODIUM: 140 mmol/L (ref 135–145)

## 2018-08-01 LAB — IRON AND TIBC
Iron: 20 ug/dL — ABNORMAL LOW (ref 28–170)
Saturation Ratios: 8 % — ABNORMAL LOW (ref 10.4–31.8)
TIBC: 255 ug/dL (ref 250–450)
UIBC: 235 ug/dL

## 2018-08-01 LAB — TSH: TSH: 6.361 u[IU]/mL — ABNORMAL HIGH (ref 0.350–4.500)

## 2018-08-01 MED ORDER — CHLORHEXIDINE GLUCONATE CLOTH 2 % EX PADS
6.0000 | MEDICATED_PAD | Freq: Every day | CUTANEOUS | Status: DC
  Administered 2018-08-01 – 2018-08-03 (×3): 6 via TOPICAL

## 2018-08-01 MED ORDER — FUROSEMIDE 10 MG/ML IJ SOLN
160.0000 mg | Freq: Four times a day (QID) | INTRAVENOUS | Status: DC
  Administered 2018-08-01 – 2018-08-03 (×7): 160 mg via INTRAVENOUS
  Filled 2018-08-01 (×12): qty 16

## 2018-08-01 MED ORDER — SODIUM ZIRCONIUM CYCLOSILICATE 10 G PO PACK: 10.0000 g | PACK | Freq: Three times a day (TID) | ORAL | Status: DC

## 2018-08-01 MED ORDER — SODIUM ZIRCONIUM CYCLOSILICATE 10 G PO PACK
10.0000 g | PACK | Freq: Three times a day (TID) | ORAL | Status: DC
  Administered 2018-08-01: 10 g via ORAL
  Filled 2018-08-01: qty 1

## 2018-08-01 MED ORDER — MUPIROCIN 2 % EX OINT
1.0000 "application " | TOPICAL_OINTMENT | Freq: Two times a day (BID) | CUTANEOUS | Status: DC
  Administered 2018-08-01 – 2018-08-03 (×5): 1 via NASAL
  Filled 2018-08-01: qty 22

## 2018-08-01 MED ORDER — SODIUM ZIRCONIUM CYCLOSILICATE 10 G PO PACK
10.0000 g | PACK | Freq: Three times a day (TID) | ORAL | Status: DC
  Administered 2018-08-01 (×2): 10 g via ORAL
  Filled 2018-08-01 (×3): qty 1

## 2018-08-01 MED ORDER — METOLAZONE 5 MG PO TABS
5.0000 mg | ORAL_TABLET | Freq: Every day | ORAL | Status: DC
  Administered 2018-08-01 – 2018-08-03 (×3): 5 mg via ORAL
  Filled 2018-08-01 (×3): qty 1

## 2018-08-01 NOTE — Progress Notes (Signed)
At beginning of shift, patient writhing around in bed c/o restless leg. Nighttime medicine given early

## 2018-08-01 NOTE — Progress Notes (Signed)
Has been complaining of restless leg and been very antsy today.  Is receiving scheduled requip at night and scheduled zaroxolyn and valium  Contacted Dr Wynetta Emery with this information and he stated that due to kidney function he did not want to prescribe additional medication.  Patient stated that she is used to being in wheelchair at [facility] and going out in hallway so she was assisted to wheelchair and was able to get out in hallway and self propel with feet, which helped some.

## 2018-08-01 NOTE — Progress Notes (Signed)
Patient is pulling off oxygen. Reapplied and educated.

## 2018-08-01 NOTE — Progress Notes (Signed)
Patient has refused her CPAP at this time;patinet made aware of the need to wear the device. Patient is on 4l pm [place] but still refuses to wear that and has been pull the cannula off. Nurse was made aware of the situation and will continue to monitor her through the night.Rt has a CPAP on stand by.

## 2018-08-01 NOTE — Progress Notes (Signed)
*  PRELIMINARY RESULTS* Echocardiogram 2D Echocardiogram has been performed.  Samuel Germany 08/01/2018, 12:55 PM

## 2018-08-01 NOTE — Consult Note (Signed)
Referring Provider: No ref. provider found Primary Care Physician:  Pablo Ledger, MD Primary Nephrologist:  Dr. Burnett Sheng  Reason for Consultation: Medical management of CKD stage V, maintenance of euvolemia, assessment of anemia, treatment of secondary hyperparathyroidism.  HPI: This is a delightful 73 year old lady who is a resident of the [facility] she has known chronic kidney disease and has been evaluated by vein and vascular surgery in [place].  She was due to have a fistula placed 08/02/2018 by Dr. Donnetta Hutching.  She has a previous history of diastolic heart failure, diabetes mellitus type 2, obstructive sleep apnea, right below-knee amputation, peripheral vascular disease and obesity.  She presented to the emergency room 07/31/2018 with increased weight gain of about 30 pounds.  Dyspnea on lying flat.  Lower extremity swelling.  And hyperkalemia.  Her outpatient list of medications include  For hypertension and volume control amlodipine 10 mg daily, Cardura 4 mg at bedtime, metoprolol 100 mg twice daily, torsemide 30 mg daily.   For her diabetes she receives Levemir insulin at bedtime and 70/30 daily.  For management of anemia she also receives retacrit 4000 units every 2 weeks and oral iron For metabolic bone disease she receives calcium acetate 667 mg 3 times daily, vitamin D 1.25 mg weekly, magnesium oxide 400 mg daily.  For pain and anxiety she receives Valium 2 mg twice daily, Requip 4 mg nightly, Zoloft 50 mg daily, Zanaflex 4 mg daily, and oxycodone/acetaminophen 7.5/325 milligrams 3 times a day.  Blood pressure 156/59 pulse 60 normal sinus rhythm O2 sats 99% 5 L nasal cannula weight 113 kg temperature 98  Sodium 140 potassium 5.3 chloride 106 CO2 26 glucose 151 BUN 83 creatinine 3.53 calcium 7.8 phosphorus 6.0 magnesium 2.4 WBC 6.7 hemoglobin 7.6 platelets 169.  No recorded urine output.  She is received 60 mg IV Lasix every 12 hours with a one-time dose of 80 mg given  07/31/2018.  Chest x-ray revealed cardiomegaly with mild to moderate pulmonary edema 07/31/2018 2D echo pending her last 2D echo showed an EF of 60 to 65%  Past Medical History:  Diagnosis Date  . Anemia   . Arthritis    knees  . Cellulitis and abscess of foot 02/12/2012  . CHF (congestive heart failure) ([place])   . Complication of anesthesia    pt states after her hysterectomy the doctor said she was "wild" when she woke up   . Diabetes mellitus   . Diabetes mellitus with neuropathy 07/25/2011  . Dysrhythmia    tachy- takes Metoprolol  . Hypertension   . OSA (obstructive sleep apnea) 07/25/2011   not using CPAP  . Osteomyelitis of right foot ([place]) 02/14/2012  . Partial Achilles tendon tear 02/14/2012  . Pneumonia   . Restless leg syndrome 07/25/2011  . Restless leg syndrome   . Wound, open    right foot    Past Surgical History:  Procedure Laterality Date  . ABDOMINAL HYSTERECTOMY    . ACHILLES TENDON REPAIR    . AMPUTATION Right 11/25/2012   Procedure: AMPUTATION BELOW KNEE;  Surgeon: Newt Minion, MD;  Location: [place];  Service: Orthopedics;  Laterality: Right;  Right Below Knee Amputation  . BELOW KNEE LEG AMPUTATION Right 11/25/2012   Dr Sharol Given  . BREAST SURGERY Left    Lumpectomy non ca  . CATARACT EXTRACTION W/PHACO Right 10/09/2013   Procedure: CATARACT EXTRACTION PHACO AND INTRAOCULAR LENS PLACEMENT (IOC);  Surgeon: Elta Guadeloupe T. Gershon Crane, MD;  Location: [facility];  Service: Ophthalmology;  Laterality: Right;  CDE:9.05  . CATARACT EXTRACTION W/PHACO Left 10/23/2013   Procedure: ATTEMPTED CATARACT EXTRACTION PHACO AND INTRAOCULAR LENS PLACEMENT;  Surgeon: Elta Guadeloupe T. Gershon Crane, MD;  Location: [facility];  Service: Ophthalmology;  Laterality: Left;  CDE:  4.41  . TOE AMPUTATION     partial rt great toe    Prior to Admission medications   Medication Sig Start Date End Date Taking? Authorizing Provider  amLODipine (NORVASC) 10 MG tablet Take 10 mg by mouth daily.   Yes [provider]   calcium acetate (PHOSLO) 667 MG capsule Take 667 mg by mouth 3 (three) times daily with meals.   Yes [provider]  diazepam (VALIUM) 2 MG tablet Take 2 mg by mouth 2 (two) times daily.   Yes [provider]  doxazosin (CARDURA) 4 MG tablet Take 4 mg by mouth at bedtime.    Yes [provider]  epoetin alfa-epbx (RETACRIT) 4000 UNIT/ML injection Inject 4,000 Units into the skin every 14 (fourteen) days.    Yes [provider]  ferrous sulfate 325 (65 FE) MG tablet Take 325 mg by mouth daily.    Yes [provider]  gabapentin (NEURONTIN) 600 MG tablet Take 600 mg by mouth 3 (three) times daily.   Yes [provider]  guaiFENesin-dextromethorphan (ROBITUSSIN DM) 100-10 MG/5ML syrup Take 10 mLs by mouth every 4 (four) hours as needed for cough.   Yes [provider]  insulin detemir (LEVEMIR) 100 UNIT/ML injection Inject 20 Units into the skin at bedtime.   Yes [provider]  insulin NPH-regular Human (70-30) 100 UNIT/ML injection Inject 28 Units into the skin daily. Hold if BS is less than 200   Yes [provider]  magnesium oxide (MAG-OX) 400 MG tablet Take 400 mg by mouth daily.   Yes [provider]  metoprolol tartrate (LOPRESSOR) 100 MG tablet Take 100 mg by mouth 2 (two) times daily.   Yes [provider]  Multiple Vitamin (MULTIVITAMIN) tablet Take 1 tablet by mouth daily.   Yes [provider]  oxyCODONE-acetaminophen (PERCOCET) 7.5-325 MG tablet Take 1 tablet by mouth 3 (three) times daily.   Yes [provider]  rOPINIRole (REQUIP) 2 MG tablet Take 4 mg by mouth at bedtime.  06/09/17  Yes [provider]  sertraline (ZOLOFT) 50 MG tablet Take 50 mg by mouth daily.   Yes [provider]  tiZANidine (ZANAFLEX) 4 MG tablet Take 4 mg by mouth 3 (three) times daily.   Yes [provider]  torsemide (DEMADEX) 20 MG tablet Take 30 mg by mouth daily.     Yes [provider]  Vitamin D, Ergocalciferol, (DRISDOL) 1.25 MG (50000 UT) CAPS capsule Take 50,000 Units by mouth every Monday.   Yes [provider]  promethazine (PHENERGAN) 25 MG tablet Take 25 mg by mouth every 4 (four) hours as needed for nausea or vomiting.    [provider]    Current Facility-Administered Medications  Medication Dose Route Frequency Provider Last Rate Last Dose  . acetaminophen (TYLENOL) tablet 650 mg  650 mg Oral Q6H PRN Emokpae, Ejiroghene E, MD       Or  . acetaminophen (TYLENOL) suppository 650 mg  650 mg Rectal Q6H PRN Emokpae, Ejiroghene E, MD      . amLODipine (NORVASC) tablet 10 mg  10 mg Oral Daily Emokpae, Ejiroghene E, MD   10 mg at 08/01/18 0913  . calcium acetate (PHOSLO) capsule 667 mg  667  mg Oral TID WC Emokpae, Ejiroghene E, MD   667 mg at 08/01/18 0913  . diazepam (VALIUM) tablet 2 mg  2 mg Oral BID Emokpae, Ejiroghene E, MD   2 mg at 08/01/18 0913  . doxazosin (CARDURA) tablet 4 mg  4 mg Oral QHS Emokpae, Ejiroghene E, MD   4 mg at 07/31/18 2148  . ferrous sulfate tablet 325 mg  325 mg Oral Daily Emokpae, Ejiroghene E, MD      . furosemide (LASIX) injection 60 mg  60 mg Intravenous Q12H Emokpae, Ejiroghene E, MD   60 mg at 08/01/18 0909  . heparin injection 5,000 Units  5,000 Units Subcutaneous Q8H Emokpae, Ejiroghene E, MD   5,000 Units at 07/31/18 2149  . hydrALAZINE (APRESOLINE) injection 10 mg  10 mg Intravenous Q4H PRN Emokpae, Ejiroghene E, MD      . insulin aspart (novoLOG) injection 0-9 Units  0-9 Units Subcutaneous TID WC Emokpae, Ejiroghene E, MD      . insulin detemir (LEVEMIR) injection 15 Units  15 Units Subcutaneous QHS Emokpae, Ejiroghene E, MD   15 Units at 07/31/18 2148  . magnesium oxide (MAG-OX) tablet 400 mg  400 mg Oral Daily Emokpae, Ejiroghene E, MD   400 mg at 08/01/18 0913  . metoprolol tartrate (LOPRESSOR) tablet 100 mg  100 mg Oral BID Emokpae, Ejiroghene E, MD   100 mg at 08/01/18 0913  .  ondansetron (ZOFRAN) tablet 4 mg  4 mg Oral Q6H PRN Emokpae, Ejiroghene E, MD       Or  . ondansetron (ZOFRAN) injection 4 mg  4 mg Intravenous Q6H PRN Emokpae, Ejiroghene E, MD      . oxyCODONE-acetaminophen (PERCOCET) 7.5-325 MG per tablet 1 tablet  1 tablet Oral Q8H PRN Emokpae, Ejiroghene E, MD      . polyethylene glycol (MIRALAX / GLYCOLAX) packet 17 g  17 g Oral Daily PRN Emokpae, Ejiroghene E, MD      . rOPINIRole (REQUIP) tablet 4 mg  4 mg Oral QHS Emokpae, Ejiroghene E, MD   4 mg at 07/31/18 2147  . sertraline (ZOLOFT) tablet 50 mg  50 mg Oral Daily Emokpae, Ejiroghene E, MD   50 mg at 08/01/18 0914  . sodium zirconium cyclosilicate (LOKELMA) packet 10 g  10 g Oral TID Wynetta Emery, Clanford L, MD   10 g at 08/01/18 0913  . tiZANidine (ZANAFLEX) tablet 4 mg  4 mg Oral TID Emokpae, Ejiroghene E, MD   4 mg at 08/01/18 0913    Allergies as of 07/31/2018  . (No Known Allergies)    Family History  Problem Relation Age of Onset  . Diabetes Sister     Social History   Socioeconomic History  . Marital status: Married    Spouse name: Not on file  . Number of children: Not on file  . Years of education: Not on file  . Highest education level: Not on file  Occupational History  . Not on file  Social Needs  . Financial resource strain: Not on file  . Food insecurity:    Worry: Not on file    Inability: Not on file  . Transportation needs:    Medical: Not on file    Non-medical: Not on file  Tobacco Use  . Smoking status: Never Smoker  . Smokeless tobacco: Never Used  Substance and Sexual Activity  . Alcohol use: No  . Drug use: No  . Sexual activity: Never  Lifestyle  . Physical activity:  Days per week: Not on file    Minutes per session: Not on file  . Stress: Not on file  Relationships  . Social connections:    Talks on phone: Not on file    Gets together: Not on file    Attends religious service: Not on file    Active member of club or organization: Not on file     Attends meetings of clubs or organizations: Not on file    Relationship status: Not on file  . Intimate partner violence:    Fear of current or ex partner: Not on file    Emotionally abused: Not on file    Physically abused: Not on file    Forced sexual activity: Not on file  Other Topics Concern  . Not on file  Social History Narrative  . Not on file    Review of Systems: Gen: Admits to fatigue weakness malaise and about a 30 pound weight gain HEENT: No visual complaints, No history of Retinopathy. Normal external appearance No Epistaxis or Sore throat. No sinusitis.   CV: Denies chest pain, angina, palpitations, syncope he has orthopnea Resp: Dyspnea at rest especially on lying flat with no cough wheeze sputum production GI: Denies vomiting blood, jaundice, and fecal incontinence.   Denies dysphagia or odynophagia. GU : Denies urinary burning, blood in urine, urinary frequency, urinary hesitancy, nocturnal urination, and urinary incontinence.  No renal calculi. MS: Denies joint pain, limitation of movement, and swelling, stiffness, low back pain, extremity pain.  Admits to weakness with no use nonsteroidal anti-inflammatory drugs Derm: Denies rash, itching, dry skin, hives, moles, warts, or unhealing ulcers.  Psych: She appears very flat affect Heme: Denies bruising, bleeding, and enlarged lymph nodes. Neuro: No headache.  No diplopia. No dysarthria.  No dysphasia.  No history of CVA.  No Seizures. No paresthesias.  No weakness. Endocrine history of diabetes mellitus type 2 no Thyroid disease.  No Adrenal disease.  Physical Exam: Vital signs in last 24 hours: Temp:  [97.5 F (36.4 C)-98.2 F (36.8 C)] 98 F (36.7 C) (03/03 9450) Pulse Rate:  [60-72] 60 (03/03 0614) Resp:  [12-36] 16 (03/03 0614) BP: (129-180)/(59-105) 156/59 (03/03 0614) SpO2:  [92 %-100 %] 99 % (03/03 0614) Weight:  [111.1 kg-114.1 kg] 113 kg (03/03 0500) Last BM Date: 07/31/18 General:   Obese chronically  ill-appearing lady lying in bed using oxygen Head:  Normocephalic and atraumatic. Eyes:  Sclera clear, no icterus.   Conjunctival pale Ears:  Normal auditory acuity. Nose:  No deformity, discharge,  or lesions. Mouth:  No deformity or lesions, dentition normal. Neck:  Supple; no masses or thyromegaly. JVP mildly elevated neck veins Lungs: Diminished air entry with crackles heard at the bases Heart:  Regular rate and rhythm; 3/6 systolic murmur Abdomen:  Soft, nontender and nondistended. No masses, hepatosplenomegaly or hernias noted. Normal bowel sounds, without guarding, and without rebound.   Msk: Amputated BKA well-healed stump Pulses:  No carotid, renal, femoral bruits. DP and PT symmetrical and equal Extremities: Edema 3+ Neurologic:  Alert and  oriented x4;  grossly normal neurologically. Skin:  Intact without significant lesions or rashes. Cervical Nodes:  No significant cervical adenopathy. Psych: Appears depressed and anxious  Intake/Output from previous day: No intake/output data recorded. Intake/Output this shift: No intake/output data recorded.  Lab Results: Recent Labs    07/31/18 1700 08/01/18 0328  WBC 6.2 6.7  HGB 8.6* 7.6*  HCT 27.9* 25.1*  PLT 190 169   BMET Recent  Labs    07/31/18 1700 08/01/18 0328  NA 138 140  K 5.6* 5.3*  CL 105 106  CO2 24 26  GLUCOSE 162* 151*  BUN 80* 83*  CREATININE 3.45* 3.35*  CALCIUM 8.0* 7.8*  PHOS 6.0*  --    LFT No results for input(s): PROT, ALBUMIN, AST, ALT, ALKPHOS, BILITOT, BILIDIR, IBILI in the last 72 hours. PT/INR No results for input(s): LABPROT, INR in the last 72 hours. Hepatitis Panel No results for input(s): HEPBSAG, HCVAB, HEPAIGM, HEPBIGM in the last 72 hours.  Studies/Results: Dg Chest 2 View  Result Date: 07/31/2018 CLINICAL DATA:  Shortness of breath for a few days. EXAM: CHEST - 2 VIEW COMPARISON:  May 31, 2018 FINDINGS: Cardiomegaly. Mild to moderate pulmonary edema. No other interval  changes or acute abnormalities. IMPRESSION: Cardiomegaly and mild to moderate pulmonary edema. Electronically Signed   By: Dorise Bullion III M.D   On: 07/31/2018 18:14    Assessment/Plan:  Chronic kidney disease stage IV/V.  Appears that there are plans for access placement and initiation of dialysis.  There is no absolute urgency for dialysis at this present time.  I think we can augment her diuresis with using high doses of Lasix and treat her hyperkalemia.  This can probably be accomplished medically at this point.  If she does need access I suggest that she is transferred to [facility] where she can receive vascular surgical consultation as well as placement of AV fistula and tunneled dialysis catheter.  I explained this to the patient and she is prepared to be [facility] if necessary.  Anemia check iron studies.  Patient will need to receive Procrit we can arrange for this is hospitalized.  Bones will check PTH.  Continue binders and follow phosphorus and calcium.  Congestive heart failure with diastolic dysfunction agree with 2D echo  Hypertension/volume will increase diuresis by adding 160 mg IV Lasix every 6 hours with addition of 5 mg daily metolazone.  Strict I's and O's with fluid restriction if necessary  Hyperkalemia we will give Lokelma 10 mg 3 times daily.  Avoid agents that could worsen hyperkalemia.  Peripheral vascular disease status post amputation.  Stable  Diabetes mellitus per primary team  Chronic pain multiple pain medications caution with using morphine and Dilaudid.  Would recommend fentanyl for pain control.  She is using Neurontin and Requip all of which can lead to respiratory depression.   LOS: 0 Sherril Croon @TODAY @9 :20 AM

## 2018-08-01 NOTE — Progress Notes (Addendum)
Refuses insulin unless glucose greater than 200. Dr. Wynetta Emery notified.

## 2018-08-01 NOTE — Progress Notes (Signed)
PROGRESS NOTE  DEEYA RICHESON  S[facility]:[id number]  DOB: 03-23-1946  DOA: 07/31/2018 PCP: Pablo Ledger, MD  Brief Admission Hx: Mary [name] is a 73 y.o. female with medical history significant for diastolic CHF, DM, OSA, right below knee amputation, peripheral vascular disease, obesity, who presented to the ED with complaints of worsening difficulty breathing especially when she is lying flat.   MDM/Assessment & Plan:   1. Acute on chronic diastolic CHF exacerbation - Pt presents with volume overload and reported 15+# weight gain.  I spoke with her physician at [facility] who reported that he was worried that patient will need hemodialysis soon.   2. AKI on CKD stage 4/5 - Pt had plans for access placement with Dr. Donnetta Hutching later this week.  I spoke with Dr. Justin Mend who kindly agreed to consult.   For now, will continue IV lasix and follow renal function panel.  3. Hyperkalemia - lokelma and lasix ordered.  Follow.  4. PVD - stable s/p amputation.  5. Type 2 DM with vascular complications - pt refusing to take insulin for BS less than 200.   6. Chronic pain - cautious use of medication in setting of worsening CKD.  7. Hypertension - IV lasix high dose ordered by nephrology team.  8. Anemia of CKD - iron studies pending.  Nephrology considering Procrit.  9. RLS - resumed home ropinirole.  10. OSA - CPAP ordered.    DVT prophylaxis: heparin  Code Status: Full  Family Communication: patient  Disposition Plan: inpatient    Consultants:  nephrology  Procedures:    Antimicrobials:     Subjective: Pt reports that she has RLS symptoms really bad and upset she received her requip later than she normally takes it.   Objective: Vitals:   07/31/18 2042 08/01/18 0500 08/01/18 0614 08/01/18 1640  BP: (!) 180/69  (!) 156/59 (!) 153/50  Pulse: 64  60 71  Resp: 19  16 20   Temp: 98.2 F (36.8 C)  98 F (36.7 C) 97.8 F (36.6 C)  TempSrc: Oral  Oral Oral  SpO2:   99%  97%  Weight: 114.1 kg 113 kg    Height: 5\' 10"  (1.778 m)       Intake/Output Summary (Last 24 hours) at 08/01/2018 1804 Last data filed at 08/01/2018 1558 Gross per 24 hour  Intake -  Output 3400 ml  Net -3400 ml   Filed Weights   07/31/18 1638 07/31/18 2042 08/01/18 0500  Weight: 111.1 kg 114.1 kg 113 kg   REVIEW OF SYSTEMS  As per history otherwise all reviewed and reported negative  Exam:  General exam: awake, alert, NAD. Cooperative.  Respiratory system: bibasilar crackles. No increased work of breathing. Cardiovascular system: S1 & S2 heard. No JVD, murmurs, gallops, clicks or pedal edema. Gastrointestinal system: Abdomen is nondistended, soft and nontender. Normal bowel sounds heard. Central nervous system: Alert and oriented. No focal neurological deficits. Extremities: right BKA.  Data Reviewed: Basic Metabolic Panel: Recent Labs  Lab 07/31/18 1700 08/01/18 0328 08/01/18 1643  NA 138 140 139  K 5.6* 5.3* 4.8  CL 105 106 102  CO2 24 26 27   GLUCOSE 162* 151* 153*  BUN 80* 83* 81*  CREATININE 3.45* 3.35* 3.37*  CALCIUM 8.0* 7.8* 8.0*  MG 2.4  --   --   PHOS 6.0*  --  6.1*   Liver Function Tests: Recent Labs  Lab 08/01/18 1643  ALBUMIN 3.1*   No results for input(s): LIPASE,  AMYLASE in the last 168 hours. No results for input(s): AMMONIA in the last 168 hours. CBC: Recent Labs  Lab 07/31/18 1700 08/01/18 0328  WBC 6.2 6.7  NEUTROABS 4.4  --   HGB 8.6* 7.6*  HCT 27.9* 25.1*  MCV 93.3 94.4  PLT 190 169   Cardiac Enzymes: Recent Labs  Lab 07/31/18 2150 08/01/18 0328 08/01/18 0950  TROPONINI <0.03 <0.03 <0.03   CBG (last 3)  Recent Labs    08/01/18 0734 08/01/18 1108 08/01/18 1642  GLUCAP 120* 195* 139*   Recent Results (from the past 240 hour(s))  MRSA PCR Screening     Status: Abnormal   Collection Time: 08/01/18  6:00 AM  Result Value Ref Range Status   MRSA by PCR POSITIVE (A) NEGATIVE Final    Comment:        The GeneXpert MRSA  Assay (FDA approved for NASAL specimens only), is one component of a comprehensive MRSA colonization surveillance program. It is not intended to diagnose MRSA infection nor to guide or monitor treatment for MRSA infections. RESULT CALLED TO, READ BACK BY AND VERIFIED WITH: JACKSON N. AT 0829A ON [id number] BY THOMPSON S. Performed at [facility], [address], [place], [place] [address]     Studies: Dg Chest 2 View  Result Date: 07/31/2018 CLINICAL DATA:  Shortness of breath for a few days. EXAM: CHEST - 2 VIEW COMPARISON:  May 31, 2018 FINDINGS: Cardiomegaly. Mild to moderate pulmonary edema. No other interval changes or acute abnormalities. IMPRESSION: Cardiomegaly and mild to moderate pulmonary edema. Electronically Signed   By: Dorise Bullion III M.D   On: 07/31/2018 18:14   Scheduled Meds: . amLODipine  10 mg Oral Daily  . calcium acetate  667 mg Oral TID WC  . Chlorhexidine Gluconate Cloth  6 each Topical Q0600  . diazepam  2 mg Oral BID  . doxazosin  4 mg Oral QHS  . ferrous sulfate  325 mg Oral Daily  . heparin  5,000 Units Subcutaneous Q8H  . insulin aspart  0-9 Units Subcutaneous TID WC  . insulin detemir  15 Units Subcutaneous QHS  . magnesium oxide  400 mg Oral Daily  . metolazone  5 mg Oral Daily  . metoprolol tartrate  100 mg Oral BID  . mupirocin ointment  1 application Nasal BID  . rOPINIRole  4 mg Oral QHS  . sertraline  50 mg Oral Daily  . sodium zirconium cyclosilicate  10 g Oral TID  . tiZANidine  4 mg Oral TID   Continuous Infusions: . furosemide      Active Problems:   CHF exacerbation (H[facility])   Acute on chronic diastolic CHF (congestive heart failure) ([place])  Time spent:   Irwin Brakeman, MD [facility] 08/01/2018, 6:04 PM    LOS: 0 days  How to contact the [employer] Attending or Consulting provider [place] or covering provider during after hours [place], for this patient?  1. Check the care team in [facility] and look for a) attending/consulting  [facility] provider listed and b) the [facility] team listed 2. Log into [email or web address] and use [place]'s universal password to access. If you do not have the password, please contact the hospital operator. 3. Locate the [facility] provider you are looking for under [facility] and page to a number that you can be directly reached. 4. If you still have difficulty reaching the provider, please page the [facility] (Director on Call) for the Hospitalists listed on amion for assistance.

## 2018-08-01 NOTE — Telephone Encounter (Signed)
Karoline Caldwell, PA called to let me know that this patient is presently at [facility] and has been seen by Dr. Justin Mend. Will wait to see if patient needs to have a TDC placed tomorrow along with her planned left arm AVF by Dr. Donnetta Hutching. I called Ramiro Harvest, PA for Dr. Lowanda Foster and he is also aware of the patient's status.

## 2018-08-02 ENCOUNTER — Ambulatory Visit (HOSPITAL_COMMUNITY): Admission: RE | Admit: 2018-08-02 | Payer: Medicare Other | Source: Home / Self Care | Admitting: Vascular Surgery

## 2018-08-02 HISTORY — DX: Essential (primary) hypertension: I10

## 2018-08-02 HISTORY — DX: Pneumonia, unspecified organism: J18.9

## 2018-08-02 LAB — RENAL FUNCTION PANEL
Albumin: 2.9 g/dL — ABNORMAL LOW (ref 3.5–5.0)
Anion gap: 10 (ref 5–15)
BUN: 79 mg/dL — ABNORMAL HIGH (ref 8–23)
CO2: 29 mmol/L (ref 22–32)
Calcium: 8 mg/dL — ABNORMAL LOW (ref 8.9–10.3)
Chloride: 102 mmol/L (ref 98–111)
Creatinine, Ser: 3.4 mg/dL — ABNORMAL HIGH (ref 0.44–1.00)
GFR calc Af Amer: 15 mL/min — ABNORMAL LOW (ref >60)
GFR calc non Af Amer: 13 mL/min — ABNORMAL LOW (ref >60)
Glucose, Bld: 131 mg/dL — ABNORMAL HIGH (ref 70–99)
Phosphorus: 6.1 mg/dL — ABNORMAL HIGH (ref 2.5–4.6)
Potassium: 4 mmol/L (ref 3.5–5.1)
Sodium: 141 mmol/L (ref 135–145)

## 2018-08-02 LAB — GLUCOSE, CAPILLARY
GLUCOSE-CAPILLARY: 125 mg/dL — AB (ref 70–99)
GLUCOSE-CAPILLARY: 195 mg/dL — AB (ref 70–99)
Glucose-Capillary: 172 mg/dL — ABNORMAL HIGH (ref 70–99)
Glucose-Capillary: 210 mg/dL — ABNORMAL HIGH (ref 70–99)

## 2018-08-02 LAB — PARATHYROID HORMONE, INTACT (NO CA): PTH: 33 pg/mL (ref 15–65)

## 2018-08-02 SURGERY — ARTERIOVENOUS (AV) FISTULA CREATION
Anesthesia: Monitor Anesthesia Care | Laterality: Left

## 2018-08-02 MED ORDER — SODIUM CHLORIDE 0.9 % IV SOLN
125.0000 mg | Freq: Every day | INTRAVENOUS | Status: DC
  Administered 2018-08-02 – 2018-08-03 (×2): 125 mg via INTRAVENOUS
  Filled 2018-08-02 (×4): qty 10

## 2018-08-02 NOTE — Progress Notes (Signed)
[facility] ROUNDING NOTE   Subjective:   Brief history.  This a lady with near end-stage renal disease stage IV/V chronic kidney disease.  She was due to see Dr. early 08/02/2018 that is today for placement of AV fistula this is been canceled due to her hospitalization.  She has diastolic heart failure diabetes obstructive sleep apnea and status post right below-knee amputation with peripheral vascular disease and obesity.  She is in the [facility].  She presented to the emergency room with a 30 pound weight gain.  We have proceeded with diuresis using IV Lasix 160 mg every 6 hours and metolazone 5 mg daily.  She has had a diuresis of 3.4 L 08/01/2018.  I am excited about her progress.  We seem to be able to manage this medically at this point without the use of dialysis.  Blood pressure 129/53 pulse 78 temperature 98.3.   Sodium 141 potassium 4.0 chloride 102 CO2 29 glucose 131 BUN 79 creatinine 3.4 calcium 8.0 phosphorus 6.1 and albumin 2.9   TSH was elevated at 6.3.  Iron sats were 8% and PTH of 33  Medications Lodine 10 mg daily Cardura 4 mg nightly Lasix 160 mg IV every 6 hours hydralazine 10 mg every 4 hours as needed metolazone 5 mg daily metoprolol 100 mg twice daily.  Zoloft 50 mg daily Valium 2 mg 2 tablets daily.  Levemir 15 units subcu at bedtime.  Calcium acetate 667 mg q. before meals.  Iron sulfate 325 mg daily.  Requip 4 mg nightly Zanaflex 4 mg 3 times daily magnesium oxide 400 mg daily  Objective:  Vital signs in last 24 hours:  Temp:  [97.8 F (36.6 C)-98.3 F (36.8 C)] 98.3 F (36.8 C) (03/04 0800) Pulse Rate:  [71-78] 78 (03/04 0800) Resp:  [18-20] 18 (03/04 0800) BP: (129-172)/(50-65) 129/53 (03/04 0800) SpO2:  [95 %-98 %] 98 % (03/04 0800) Weight:  [108.7 kg] 108.7 kg (03/04 0500)  Weight change: -2.431 kg Filed Weights   07/31/18 2042 08/01/18 0500 08/02/18 0500  Weight: 114.1 kg 113 kg 108.7 kg    Intake/Output: I/O last 3 completed shifts: In:  198 [IV Piggyback:198] Out: 3400 [Urine:3400]   Intake/Output this shift:  Total I/O In: -  Out: 750 [Urine:750] Alert and oriented appears to be doing well sitting out of bed eating breakfast [employer]-regular rate and rhythm with a faint 3 out of 6 systolic murmur left sternal edge RS-clear anteriorly some decreased air basal lung fields ABD- BS present soft non-distended EXT-trace edema lower extremities   Basic Metabolic Panel: Recent Labs  Lab 07/31/18 1700 08/01/18 0328 08/01/18 1643 08/02/18 0438  NA 138 140 139 141  K 5.6* 5.3* 4.8 4.0  CL 105 106 102 102  CO2 24 26 27 29   GLUCOSE 162* 151* 153* 131*  BUN 80* 83* 81* 79*  CREATININE 3.45* 3.35* 3.37* 3.40*  CALCIUM 8.0* 7.8* 8.0* 8.0*  MG 2.4  --   --   --   PHOS 6.0*  --  6.1* 6.1*    Liver Function Tests: Recent Labs  Lab 08/01/18 1643 08/02/18 0438  ALBUMIN 3.1* 2.9*   No results for input(s): LIPASE, AMYLASE in the last 168 hours. No results for input(s): AMMONIA in the last 168 hours.  CBC: Recent Labs  Lab 07/31/18 1700 08/01/18 0328  WBC 6.2 6.7  NEUTROABS 4.4  --   HGB 8.6* 7.6*  HCT 27.9* 25.1*  MCV 93.3 94.4  PLT 190 169  Cardiac Enzymes: Recent Labs  Lab 07/31/18 2150 08/01/18 0328 08/01/18 0950  TROPONINI <0.03 <0.03 <0.03    BNP: Invalid input(s): POCBNP  CBG: Recent Labs  Lab 08/01/18 0734 08/01/18 1108 08/01/18 1642 08/01/18 2052 08/02/18 0738  GLUCAP 120* 195* 139* 148* 125*    Microbiology: Results for orders placed or performed during the hospital encounter of 07/31/18  MRSA PCR Screening     Status: Abnormal   Collection Time: 08/01/18  6:00 AM  Result Value Ref Range Status   MRSA by PCR POSITIVE (A) NEGATIVE Final    Comment:        The GeneXpert MRSA Assay (FDA approved for NASAL specimens only), is one component of a comprehensive MRSA colonization surveillance program. It is not intended to diagnose MRSA infection nor to guide or monitor treatment  for MRSA infections. RESULT CALLED TO, READ BACK BY AND VERIFIED WITH: JACKSON N. AT 0829A ON [id number] BY THOMPSON S. Performed at [facility], [address], [place], [place] [address]     Coagulation Studies: No results for input(s): LABPROT, INR in the last 72 hours.  Urinalysis: No results for input(s): COLORURINE, LABSPEC, PHURINE, GLUCOSEU, HGBUR, BILIRUBINUR, KETONESUR, PROTEINUR, UROBILINOGEN, NITRITE, LEUKOCYTESUR in the last 72 hours.  Invalid input(s): APPERANCEUR    Imaging: Dg Chest 2 View  Result Date: 07/31/2018 CLINICAL DATA:  Shortness of breath for a few days. EXAM: CHEST - 2 VIEW COMPARISON:  May 31, 2018 FINDINGS: Cardiomegaly. Mild to moderate pulmonary edema. No other interval changes or acute abnormalities. IMPRESSION: Cardiomegaly and mild to moderate pulmonary edema. Electronically Signed   By: Dorise Bullion III M.D   On: 07/31/2018 18:14     Medications:   . furosemide 160 mg (08/02/18 0022)   . amLODipine  10 mg Oral Daily  . calcium acetate  667 mg Oral TID WC  . Chlorhexidine Gluconate Cloth  6 each Topical Q0600  . diazepam  2 mg Oral BID  . doxazosin  4 mg Oral QHS  . ferrous sulfate  325 mg Oral Daily  . heparin  5,000 Units Subcutaneous Q8H  . insulin aspart  0-9 Units Subcutaneous TID WC  . insulin detemir  15 Units Subcutaneous QHS  . magnesium oxide  400 mg Oral Daily  . metolazone  5 mg Oral Daily  . metoprolol tartrate  100 mg Oral BID  . mupirocin ointment  1 application Nasal BID  . rOPINIRole  4 mg Oral QHS  . sertraline  50 mg Oral Daily  . sodium zirconium cyclosilicate  10 g Oral TID  . tiZANidine  4 mg Oral TID   acetaminophen **OR** acetaminophen, hydrALAZINE, ondansetron **OR** ondansetron (ZOFRAN) IV, oxyCODONE-acetaminophen, polyethylene glycol  Assessment/ Plan:   Chronic kidney disease stage IV/V.  She has plans for access placement as an outpatient I do not think there is any absolute indication for dialysis at  this present time.  I think it is reasonable at this point to continue diuresis for another 24 hours.  I would discontinue her Lokelma at this point.  Anemia she has some iron deficiency will start IV iron  Bones PTH appears to be within goal continue to follow calcium and phosphorus she continues on binders  2D echo shows ejection fraction 60 to 65% probably is diastolic dysfunction  Hypertension/volume appears to be doing well with great diuresis will continue admission for at least another 24 hours probably will need discharge with high-dose Lasix and as needed metolazone  Peripheral vascular disease  status post amputation stable  Diabetes mellitus per primary team  Chronic pain multiple medications agree with addition of antidepressant medication and trying to limit the use of narcotic agents.  Hyperkalemia appears to be stable will discontinue Lokelma   LOS: 1 Sherril Croon @TODAY @10 :03 AM

## 2018-08-02 NOTE — Progress Notes (Signed)
Patient is refusing the use of CPAP at this time. Informed patient to call If she changes her mind.

## 2018-08-02 NOTE — Progress Notes (Signed)
PROGRESS NOTE  Mary [name]  EVO:[id number]  DOB: 07-26-1945  DOA: 07/31/2018 PCP: Pablo Ledger, MD  Brief Admission Hx: Mary [name] is a 73 y.o. female with medical history significant for diastolic CHF, DM, OSA, right below knee amputation, peripheral vascular disease, obesity, who presented to the ED with complaints of worsening difficulty breathing especially when she is lying flat.   MDM/Assessment & Plan:   1. Acute on chronic diastolic CHF exacerbation - Mary [name] presented with volume overload and reported 15+# weight gain.  She has been seen by nephrology and being diuresed with IV lasix (high dose) with good results.  Clinically she is improving and hopeful to go back to [facility] tomorrow.  2. AKI on CKD stage 4/5 - Mary [name] had plans for access placement with Dr. Donnetta Hutching later this week which will need to be rescheduled.  Renal function holding stable with diuresis.   3. Hyperkalemia - Resolved.  4. PVD - stable s/p right below knee amputation.  5. Type 2 DM with vascular complications - Mary [name] refusing to take insulin for BS less than 200.   6. Chronic pain - cautious use of medication in setting of worsening CKD.  7. Hypertension - IV lasix high dose ordered by nephrology team. BP well controlled.  8. Anemia of CKD - iron started by nephrology team. 9. RLS - resumed home ropinirole.  10. OSA - CPAP ordered.   DVT prophylaxis: heparin  Code Status: Full  Family Communication: patient  Disposition Plan: inpatient    Consultants:  nephrology  Procedures:    Antimicrobials:     Subjective: Mary [name] reports she feels about the same but has been urinating a lot.    Objective: Vitals:   08/02/18 0429 08/02/18 0500 08/02/18 0800 08/02/18 1400  BP: 130/65  (!) 129/53 127/65  Pulse: 75  78 77  Resp: 20  18 17   Temp: 97.9 F (36.6 C)  98.3 F (36.8 C) 98.2 F (36.8 C)  TempSrc: Oral  Oral Oral  SpO2: 95%  98% 96%  Weight:  108.7 kg    Height:         Intake/Output Summary (Last 24 hours) at 08/02/2018 1538 Last data filed at 08/02/2018 1529 Gross per 24 hour  Intake 678 ml  Output 3950 ml  Net -3272 ml   Filed Weights   07/31/18 2042 08/01/18 0500 08/02/18 0500  Weight: 114.1 kg 113 kg 108.7 kg   REVIEW OF SYSTEMS  As per history otherwise all reviewed and reported negative  Exam:  General exam: awake, alert, NAD. Cooperative.  Respiratory system: bibasilar crackles. No increased work of breathing. Cardiovascular system: S1 & S2 heard. No JVD, murmurs, gallops, clicks or pedal edema. Gastrointestinal system: Abdomen is nondistended, soft and nontender. Normal bowel sounds heard. Central nervous system: Alert and oriented. No focal neurological deficits. Extremities: right BKA.  Data Reviewed: Basic Metabolic Panel: Recent Labs  Lab 07/31/18 1700 08/01/18 0328 08/01/18 1643 08/02/18 0438  NA 138 140 139 141  K 5.6* 5.3* 4.8 4.0  CL 105 106 102 102  CO2 24 26 27 29   GLUCOSE 162* 151* 153* 131*  BUN 80* 83* 81* 79*  CREATININE 3.45* 3.35* 3.37* 3.40*  CALCIUM 8.0* 7.8* 8.0* 8.0*  MG 2.4  --   --   --   PHOS 6.0*  --  6.1* 6.1*   Liver Function Tests: Recent Labs  Lab 08/01/18 1643 08/02/18 0438  ALBUMIN 3.1* 2.9*   No results for  input(s): LIPASE, AMYLASE in the last 168 hours. No results for input(s): AMMONIA in the last 168 hours. CBC: Recent Labs  Lab 07/31/18 1700 08/01/18 0328  WBC 6.2 6.7  NEUTROABS 4.4  --   HGB 8.6* 7.6*  HCT 27.9* 25.1*  MCV 93.3 94.4  PLT 190 169   Cardiac Enzymes: Recent Labs  Lab 07/31/18 2150 08/01/18 0328 08/01/18 0950  TROPONINI <0.03 <0.03 <0.03   CBG (last 3)  Recent Labs    08/01/18 2052 08/02/18 0738 08/02/18 1136  GLUCAP 148* 125* 195*   Recent Results (from the past 240 hour(s))  MRSA PCR Screening     Status: Abnormal   Collection Time: 08/01/18  6:00 AM  Result Value Ref Range Status   MRSA by PCR POSITIVE (A) NEGATIVE Final    Comment:         The GeneXpert MRSA Assay (FDA approved for NASAL specimens only), is one component of a comprehensive MRSA colonization surveillance program. It is not intended to diagnose MRSA infection nor to guide or monitor treatment for MRSA infections. RESULT CALLED TO, READ BACK BY AND VERIFIED WITH: JACKSON N. AT 0829A ON [id number] BY THOMPSON S. Performed at [facility], [address] [place], [place] [address]     Studies: Dg Chest 2 View  Result Date: 07/31/2018 CLINICAL DATA:  Shortness of breath for a few days. EXAM: CHEST - 2 VIEW COMPARISON:  May 31, 2018 FINDINGS: Cardiomegaly. Mild to moderate pulmonary edema. No other interval changes or acute abnormalities. IMPRESSION: Cardiomegaly and mild to moderate pulmonary edema. Electronically Signed   By: Dorise Bullion III M.D   On: 07/31/2018 18:14   Scheduled Meds: . amLODipine  10 mg Oral Daily  . calcium acetate  667 mg Oral TID WC  . Chlorhexidine Gluconate Cloth  6 each Topical Q0600  . diazepam  2 mg Oral BID  . doxazosin  4 mg Oral QHS  . ferrous sulfate  325 mg Oral Daily  . heparin  5,000 Units Subcutaneous Q8H  . insulin aspart  0-9 Units Subcutaneous TID WC  . insulin detemir  15 Units Subcutaneous QHS  . magnesium oxide  400 mg Oral Daily  . metolazone  5 mg Oral Daily  . metoprolol tartrate  100 mg Oral BID  . mupirocin ointment  1 application Nasal BID  . rOPINIRole  4 mg Oral QHS  . sertraline  50 mg Oral Daily  . tiZANidine  4 mg Oral TID   Continuous Infusions: . ferric gluconate (FERRLECIT/NULECIT) IV 125 mg (08/02/18 1447)  . furosemide 160 mg (08/02/18 1108)    Principal Problem:   Acute on chronic diastolic CHF (congestive heart failure) (HCC) Active Problems:   OSA (obstructive sleep apnea)   Restless leg syndrome   Acute hyperkalemia   Hypertension   PVD (peripheral vascular disease) (HCC)   S/P bilateral BKA (below knee amputation) (HCC)   CHF exacerbation (HCC)   Left ventricular  dysfunction   Right ventricular dysfunction   Bilateral carotid bruits   Multiple falls   Pressure injury of skin   Anemia in chronic kidney disease (CKD)  Time spent:   Irwin Brakeman, MD [facility] 08/02/2018, 3:38 PM    LOS: 1 day  How to contact the [facility] Attending or Consulting provider [place] or covering provider during after hours [place], for this patient?  1. Check the care team in [facility] and look for a) attending/consulting [facility] provider listed and b) the [place]  team listed 2. Log into [email or web address] and use [facility]'s universal password to access. If you do not have the password, please contact the hospital operator. 3. Locate the [facility] provider you are looking for under [facility] and page to a number that you can be directly reached. 4. If you still have difficulty reaching the provider, please page the [facility] (Director on Call) for the Hospitalists listed on amion for assistance.

## 2018-08-03 DIAGNOSIS — D631 Anemia in chronic kidney disease: Secondary | ICD-10-CM

## 2018-08-03 DIAGNOSIS — I519 Heart disease, unspecified: Secondary | ICD-10-CM

## 2018-08-03 DIAGNOSIS — Z89511 Acquired absence of right leg below knee: Secondary | ICD-10-CM

## 2018-08-03 DIAGNOSIS — G4733 Obstructive sleep apnea (adult) (pediatric): Secondary | ICD-10-CM

## 2018-08-03 DIAGNOSIS — I10 Essential (primary) hypertension: Secondary | ICD-10-CM

## 2018-08-03 DIAGNOSIS — R296 Repeated falls: Secondary | ICD-10-CM

## 2018-08-03 DIAGNOSIS — Z89512 Acquired absence of left leg below knee: Secondary | ICD-10-CM

## 2018-08-03 DIAGNOSIS — N185 Chronic kidney disease, stage 5: Secondary | ICD-10-CM

## 2018-08-03 DIAGNOSIS — G2581 Restless legs syndrome: Secondary | ICD-10-CM

## 2018-08-03 LAB — RENAL FUNCTION PANEL
Albumin: 2.7 g/dL — ABNORMAL LOW (ref 3.5–5.0)
Anion gap: 9 (ref 5–15)
BUN: 91 mg/dL — ABNORMAL HIGH (ref 8–23)
CALCIUM: 8 mg/dL — AB (ref 8.9–10.3)
CO2: 32 mmol/L (ref 22–32)
Chloride: 99 mmol/L (ref 98–111)
Creatinine, Ser: 3.71 mg/dL — ABNORMAL HIGH (ref 0.44–1.00)
GFR calc Af Amer: 13 mL/min — ABNORMAL LOW (ref >60)
GFR calc non Af Amer: 12 mL/min — ABNORMAL LOW (ref >60)
Glucose, Bld: 120 mg/dL — ABNORMAL HIGH (ref 70–99)
Phosphorus: 6.2 mg/dL — ABNORMAL HIGH (ref 2.5–4.6)
Potassium: 4.7 mmol/L (ref 3.5–5.1)
Sodium: 140 mmol/L (ref 135–145)

## 2018-08-03 LAB — GLUCOSE, CAPILLARY
Glucose-Capillary: 104 mg/dL — ABNORMAL HIGH (ref 70–99)
Glucose-Capillary: 267 mg/dL — ABNORMAL HIGH (ref 70–99)

## 2018-08-03 MED ORDER — CALCIUM ACETATE 667 MG PO CAPS: 1334.0000 mg | ORAL_CAPSULE | Freq: Three times a day (TID) | ORAL | Status: AC

## 2018-08-03 MED ORDER — METOLAZONE 2.5 MG PO TABS: ORAL_TABLET | ORAL | Status: DC

## 2018-08-03 MED ORDER — CALCIUM ACETATE (PHOS BINDER) 667 MG PO CAPS
1334.0000 mg | ORAL_CAPSULE | Freq: Three times a day (TID) | ORAL | Status: DC
  Administered 2018-08-03: 1334 mg via ORAL
  Filled 2018-08-03: qty 2

## 2018-08-03 MED ORDER — OXYCODONE-ACETAMINOPHEN 7.5-325 MG PO TABS: 1.0000 | ORAL_TABLET | Freq: Three times a day (TID) | ORAL | 0 refills | Status: DC | PRN

## 2018-08-03 MED ORDER — FUROSEMIDE 80 MG PO TABS: 160.0000 mg | ORAL_TABLET | Freq: Two times a day (BID) | ORAL | Status: DC

## 2018-08-03 MED ORDER — INSULIN DETEMIR 100 UNIT/ML ~~LOC~~ SOLN: 15.0000 [IU] | Freq: Every day | SUBCUTANEOUS | 11 refills | Status: DC

## 2018-08-03 MED ORDER — FUROSEMIDE 80 MG PO TABS: 160.0000 mg | ORAL_TABLET | Freq: Two times a day (BID) | ORAL | 0 refills | Status: DC

## 2018-08-03 MED ORDER — POLYETHYLENE GLYCOL 3350 17 G PO PACK: 17.0000 g | PACK | Freq: Every day | ORAL | 0 refills | Status: DC | PRN

## 2018-08-03 MED ORDER — DIAZEPAM 2 MG PO TABS: 2.0000 mg | ORAL_TABLET | Freq: Two times a day (BID) | ORAL | 0 refills | Status: AC

## 2018-08-03 NOTE — Discharge Instructions (Signed)
·   She will need daily weights and addition of metolazone should she gain more than 3 pounds in 24 hours.    Follow closely by her primary physician.  She will need to have a rescheduled appointment for AV fistula placement.  Please follow up with nephrologist in 1 week.   Please check BMP in 1 week.

## 2018-08-03 NOTE — Plan of Care (Signed)

## 2018-08-03 NOTE — Discharge Summary (Signed)
Physician Discharge Summary  Mary [name] UEK:[id number] DOB: 14-Jun-1945 DOA: 07/31/2018  PCP: Pablo Ledger, MD Nephrologist: Dr. Lowanda Foster  Admit date: 07/31/2018 Discharge date: 08/03/2018  Admitted From: [facility]  Disposition: [facility]  Recommendations for SNF Care and Follow Up  1. Follow up with PCP in 1 weeks 2. Follow up with nephrologist in 1 week.  She will need daily weights and give a dose of metolazone should she gain more than 3 pounds in 24 hours.    Follow closely by her primary physician and nephologist.  She will need to have a rescheduled appointment for AV fistula placement.  Please follow up with nephrologist in 1 week.   Please check CBC and BMP in 1 week.   Discharge Condition: STABLE   CODE STATUS: FULL    Brief Hospitalization Summary: Please see all hospital notes, images, labs for full details of the hospitalization.  HPI: Mary [name] is a 73 y.o. female with medical history significant for diastolic CHF, DM, OSA, right below knee amputation, peripheral vascular disease, obesity, who presented to the ED with complaints of worsening difficulty breathing especially when she is lying flat.  She reports abdominal distention, and increase in size of both breasts.  She reports recent weight gain.  Reports her weight is checked every 2 days, her baseline weight is 215 and she is now up to 245. She denies chest pain or cough.  No fevers or chills.  No vomiting or loose stools.  She reports the dose of her diuretic has been increased but still she is not had any change in urine output.  She reports she is being given a low-salt diet.  Patient had blood work done that showed potassium of 6.3.  Patient was given 30 g Kayexalate.. Patient's nephrologist was contacted.    ED Course: Blood pressure systolic 150V to 697.  O2 sats greater than 92% on room air.  Potassium was 5.6.  Creatinine elevated 3.45 (result at bedside from rehab facility  shows recent creatinine 2.8-2.9 06/08/2018.  X-ray shows mild to moderate pulmonary edema.   BNP 381, prior elevations to 1600.  EKG shows sinus rhythm, without ST or T wave abnormalities.  Patient was given IV Lasix 80 mg x 1.  Hospitalist to admit for acute CHF exacerbation.  Brief Admission Hx: Mary [name] a 73 y.o.femalewith medical history significant fordiastolic CHF, DM, OSA,right below knee amputation, peripheral vascular disease, obesity, who presented to the ED with complaints of worsening difficulty breathing especially when she is lying flat.  MDM/Assessment & Plan:   1. Acute on chronic diastolic CHF exacerbation - Pt presented with volume overload and reported 15# weight gain.  She has been seen by nephrology and being diuresed with IV lasix (high dose) with good results.  She has diuresed more than 5 liters since admission. Clinically she is improved and hopeful to go back to [facility].  2. AKI on CKD stage 4/5 - Pt had plans for access placement with Dr. Donnetta Hutching which will need to be rescheduled.  Renal function holding stable with diuresis.   Pt was seen by nephrologist Dr. Justin Mend and has been transitioned to oral lasix 160 mg BID.  He recommended adding metolozone daily PRN if she has a weight gain of more than 3 pounds in a 24 hour period.  3. Hyperkalemia - Resolved. She was treated with lokelma and lasix.   4. PVD - stable s/p right below knee amputation.  5.  Type 2 DM with vascular complications - pt refused to take insulin for BS less than 200.   6. Chronic pain - cautious use of medication in setting of worsening CKD.  DC gabapentin per PharmD.   7. Hypertension -  BP well controlled in hospital.  8. Anemia of CKD - iron infusion given by nephrology team.  Please check CBC in 1 week.   9. RLS - resumed home ropinirole.  10. OSA - CPAP ordered.   DVT prophylaxis: heparin  Code Status: Full  Family Communication: patient  Disposition Plan: inpatient     Consultants:  Nephrology  NEPHROLOGY NOTES FROM DR. MARTIN WEBB 08/03/18  Chronic kidney disease stage IV/V.  She has plans for access placement as an outpatient I do not think there is any absolute indication for dialysis at this present time.  She is at great diuresis I think we can switch her to oral Lasix and will start at 160 mg twice daily.  She will need daily weights and addition of metolazone should she gain more than 3 pounds in 24 hours.  She wishes to be discharged back to the [facility] today.  I do not have a problem with this as long she is followed closely by her primary physician.  She will need to have a rescheduled appointment for AV fistula placement  Anemia she has some iron deficiency will start IV iron  Bones PTH appears to be within goal continue to follow calcium and phosphorus.  She has hyperphosphatemia will increase calcium acetate to 2 tablets with meals  2D echo shows ejection fraction 60 to 65% probably is diastolic dysfunction  Hypertension/volume appears to be doing well with great diuresis will convert to oral Lasix with use of metolazone as needed  Peripheral vascular disease status post amputation stable  Diabetes mellitus per primary team  Chronic pain multiple medications agree with addition of antidepressant medication and trying to limit the use of narcotic agents.  Hyperkalemia resolved   LOS: 2 Sherril Croon  Procedures:    Antimicrobials:     Discharge Diagnoses:  Principal Problem:   Acute on chronic diastolic CHF (congestive heart failure) (HCC) Active Problems:   OSA (obstructive sleep apnea)   Restless leg syndrome   Acute hyperkalemia   Hypertension   PVD (peripheral vascular disease) (HCC)   S/P bilateral BKA (below knee amputation) (HCC)   CHF exacerbation (HCC)   Left ventricular dysfunction   Right ventricular dysfunction   Bilateral carotid bruits   Multiple falls   Pressure injury of skin   Anemia in  chronic kidney disease (CKD)   Discharge Instructions: Discharge Instructions    Call MD for:  extreme fatigue   Complete by:  As directed    Call MD for:  persistant dizziness or light-headedness   Complete by:  As directed    Call MD for:  persistant nausea and vomiting   Complete by:  As directed    Call MD for:  severe uncontrolled pain   Complete by:  As directed    Increase activity slowly   Complete by:  As directed      Allergies as of 08/03/2018   No Known Allergies     Medication List    STOP taking these medications   gabapentin 600 MG tablet Commonly known as:  NEURONTIN   guaiFENesin-dextromethorphan 100-10 MG/5ML syrup Commonly known as:  ROBITUSSIN DM   insulin NPH-regular Human (70-30) 100 UNIT/ML injection   promethazine 25 MG  tablet Commonly known as:  PHENERGAN   torsemide 20 MG tablet Commonly known as:  DEMADEX     TAKE these medications   amLODipine 10 MG tablet Commonly known as:  NORVASC Take 10 mg by mouth daily.   calcium acetate 667 MG capsule Commonly known as:  PHOSLO Take 2 capsules (1,334 mg total) by mouth 3 (three) times daily with meals. What changed:  how much to take   diazepam 2 MG tablet Commonly known as:  VALIUM Take 1 tablet (2 mg total) by mouth 2 (two) times daily.   doxazosin 4 MG tablet Commonly known as:  CARDURA Take 4 mg by mouth at bedtime.   ferrous sulfate 325 (65 FE) MG tablet Take 325 mg by mouth daily.   furosemide 80 MG tablet Commonly known as:  LASIX Take 2 tablets (160 mg total) by mouth 2 (two) times daily for 30 days.   insulin detemir 100 UNIT/ML injection Commonly known as:  LEVEMIR Inject 0.15 mLs (15 Units total) into the skin at bedtime. What changed:  how much to take   magnesium oxide 400 MG tablet Commonly known as:  MAG-OX Take 400 mg by mouth daily.   metolazone 2.5 MG tablet Commonly known as:  ZAROXOLYN Take 1 tablet daily PRN weight gain more than 3 pounds in 24 hours    metoprolol tartrate 100 MG tablet Commonly known as:  LOPRESSOR Take 100 mg by mouth 2 (two) times daily.   multivitamin tablet Take 1 tablet by mouth daily.   oxyCODONE-acetaminophen 7.5-325 MG tablet Commonly known as:  PERCOCET Take 1 tablet by mouth every 8 (eight) hours as needed for severe pain. What changed:    when to take this  reasons to take this   polyethylene glycol packet Commonly known as:  MIRALAX / GLYCOLAX Take 17 g by mouth daily as needed for mild constipation.   RETACRIT 4000 UNIT/ML injection Generic drug:  epoetin alfa-epbx Inject 4,000 Units into the skin every 14 (fourteen) days.   rOPINIRole 2 MG tablet Commonly known as:  REQUIP Take 4 mg by mouth at bedtime.   sertraline 50 MG tablet Commonly known as:  ZOLOFT Take 50 mg by mouth daily.   tiZANidine 4 MG tablet Commonly known as:  ZANAFLEX Take 4 mg by mouth 3 (three) times daily.   Vitamin D (Ergocalciferol) 1.25 MG (50000 UT) Caps capsule Commonly known as:  DRISDOL Take 50,000 Units by mouth every Monday.      Follow-up Information    Pablo Ledger, MD. Schedule an appointment as soon as possible for a visit in 1 week(s).   Specialty:  Internal Medicine Contact information: [place] [address] [phone]        Fran Lowes, MD. Schedule an appointment as soon as possible for a visit in 1 week(s).   Specialty:  Nephrology Why:  Hospital Follow Up  Contact information: [address] [place] [address] [phone]          No Known Allergies Allergies as of 08/03/2018   No Known Allergies     Medication List    STOP taking these medications   gabapentin 600 MG tablet Commonly known as:  NEURONTIN   guaiFENesin-dextromethorphan 100-10 MG/5ML syrup Commonly known as:  ROBITUSSIN DM   insulin NPH-regular Human (70-30) 100 UNIT/ML injection   promethazine 25 MG tablet Commonly known as:  PHENERGAN   torsemide 20 MG  tablet Commonly known as:  DEMADEX  TAKE these medications   amLODipine 10 MG tablet Commonly known as:  NORVASC Take 10 mg by mouth daily.   calcium acetate 667 MG capsule Commonly known as:  PHOSLO Take 2 capsules (1,334 mg total) by mouth 3 (three) times daily with meals. What changed:  how much to take   diazepam 2 MG tablet Commonly known as:  VALIUM Take 1 tablet (2 mg total) by mouth 2 (two) times daily.   doxazosin 4 MG tablet Commonly known as:  CARDURA Take 4 mg by mouth at bedtime.   ferrous sulfate 325 (65 FE) MG tablet Take 325 mg by mouth daily.   furosemide 80 MG tablet Commonly known as:  LASIX Take 2 tablets (160 mg total) by mouth 2 (two) times daily for 30 days.   insulin detemir 100 UNIT/ML injection Commonly known as:  LEVEMIR Inject 0.15 mLs (15 Units total) into the skin at bedtime. What changed:  how much to take   magnesium oxide 400 MG tablet Commonly known as:  MAG-OX Take 400 mg by mouth daily.   metolazone 2.5 MG tablet Commonly known as:  ZAROXOLYN Take 1 tablet daily PRN weight gain more than 3 pounds in 24 hours   metoprolol tartrate 100 MG tablet Commonly known as:  LOPRESSOR Take 100 mg by mouth 2 (two) times daily.   multivitamin tablet Take 1 tablet by mouth daily.   oxyCODONE-acetaminophen 7.5-325 MG tablet Commonly known as:  PERCOCET Take 1 tablet by mouth every 8 (eight) hours as needed for severe pain. What changed:    when to take this  reasons to take this   polyethylene glycol packet Commonly known as:  MIRALAX / GLYCOLAX Take 17 g by mouth daily as needed for mild constipation.   RETACRIT 4000 UNIT/ML injection Generic drug:  epoetin alfa-epbx Inject 4,000 Units into the skin every 14 (fourteen) days.   rOPINIRole 2 MG tablet Commonly known as:  REQUIP Take 4 mg by mouth at bedtime.   sertraline 50 MG tablet Commonly known as:  ZOLOFT Take 50 mg by mouth daily.   tiZANidine 4 MG tablet Commonly  known as:  ZANAFLEX Take 4 mg by mouth 3 (three) times daily.   Vitamin D (Ergocalciferol) 1.25 MG (50000 UT) Caps capsule Commonly known as:  DRISDOL Take 50,000 Units by mouth every Monday.       Procedures/Studies: Dg Chest 2 View  Result Date: 07/31/2018 CLINICAL DATA:  Shortness of breath for a few days. EXAM: CHEST - 2 VIEW COMPARISON:  May 31, 2018 FINDINGS: Cardiomegaly. Mild to moderate pulmonary edema. No other interval changes or acute abnormalities. IMPRESSION: Cardiomegaly and mild to moderate pulmonary edema. Electronically Signed   By: Dorise Bullion III M.D   On: 07/31/2018 18:14   [place] Ue Vein Mapping Left (pre-op Avf)  Result Date: 07/24/2018 CLINICAL DATA:  73 year old female with renal failure EXAM: [place] EXTREM UP VEIN MAPPING COMPARISON:  None. FINDINGS: RIGHT ARTERIES Wrist Radial Artery: Size 36mm Waveform Triphasic Wrist Ulnar Artery: Size 83mm Waveform Triphasic Prox. Forearm Radial Artery: Size 3mm Waveform Triphasic Upper Arm Brachial Artery: Size 3.38mm Waveform Triphasic RIGHT VEINS Forearm Cephalic Vein: Prox 3.7CH Distal 2.74mm Depth 8.8FO Upper Arm Cephalic Vein: Prox 2.7XA Distal 2.35mm Depth 1.2IN Upper Arm Basilic Vein: Prox 8.6VE Distal by 6.31mm Depth 8.53mm Upper Arm Brachial Vein: Prox 3.61mm Distal 2.35mm Depth 12.12mm ADDITIONAL RIGHT VEINS Axillary Vein:  7.18mm Subclavian Vein: Patient: Yes Respiratory Phasicity: Present Internal Jugular Vein: Patent: Yes  Respiratory Phasicity: Present LEFT ARTERIES Wrist Radial Artery: Size 2.40mm Waveform Triphasic Wrist Ulnar Artery: Size 22mm Waveform Triphasic Prox. Forearm Radial Artery: Size 2.45mm Waveform Triphasic Upper Arm Brachial Artery: Size 3.38mm Waveform Triphasic LEFT VEINS Forearm Cephalic Vein: Prox 7.4QV Distal 2.37mm Depth 9.5GL Upper Arm Cephalic Vein: Prox 8.7FI Distal 4.40mm Depth 4.3PI Upper Arm Basilic Vein: Prox 9.5JO Distal 5.39mm Depth 7.79mm Upper Arm Brachial Vein: Prox 4.40mm Distal 3.30mm Depth 7.93mm  ADDITIONAL LEFT VEINS Axillary Vein:  8.36mm Subclavian Vein: Patient: Yes Respiratory Phasicity: Present Internal Jugular Vein: Patent: Yes    Respiratory Phasicity: Present IMPRESSION: Bilateral upper extremity venous and arterial mapping, as above. Electronically Signed   By: Corrie Mckusick D.O.   On: 07/24/2018 10:27   [place] Ue Vein Mapping Right (pre-op Avf)  Result Date: 07/24/2018 CLINICAL DATA:  73 year old female with renal failure EXAM: [place] EXTREM UP VEIN MAPPING COMPARISON:  None. FINDINGS: RIGHT ARTERIES Wrist Radial Artery: Size 30mm Waveform Triphasic Wrist Ulnar Artery: Size 54mm Waveform Triphasic Prox. Forearm Radial Artery: Size 67mm Waveform Triphasic Upper Arm Brachial Artery: Size 3.30mm Waveform Triphasic RIGHT VEINS Forearm Cephalic Vein: Prox 8.4ZY Distal 2.23mm Depth 6.0YT Upper Arm Cephalic Vein: Prox 0.1SW Distal 2.71mm Depth 1.0XN Upper Arm Basilic Vein: Prox 2.3FT Distal by 6.60mm Depth 8.42mm Upper Arm Brachial Vein: Prox 3.62mm Distal 2.26mm Depth 12.79mm ADDITIONAL RIGHT VEINS Axillary Vein:  7.8mm Subclavian Vein: Patient: Yes Respiratory Phasicity: Present Internal Jugular Vein: Patent: Yes    Respiratory Phasicity: Present LEFT ARTERIES Wrist Radial Artery: Size 2.16mm Waveform Triphasic Wrist Ulnar Artery: Size 61mm Waveform Triphasic Prox. Forearm Radial Artery: Size 2.38mm Waveform Triphasic Upper Arm Brachial Artery: Size 3.31mm Waveform Triphasic LEFT VEINS Forearm Cephalic Vein: Prox 7.3UK Distal 2.21mm Depth 0.2RK Upper Arm Cephalic Vein: Prox 2.7CW Distal 4.40mm Depth 2.3JS Upper Arm Basilic Vein: Prox 2.8BT Distal 5.83mm Depth 7.27mm Upper Arm Brachial Vein: Prox 4.84mm Distal 3.82mm Depth 7.31mm ADDITIONAL LEFT VEINS Axillary Vein:  8.72mm Subclavian Vein: Patient: Yes Respiratory Phasicity: Present Internal Jugular Vein: Patent: Yes    Respiratory Phasicity: Present IMPRESSION: Bilateral upper extremity venous and arterial mapping, as above. Electronically Signed   By: Corrie Mckusick D.O.   On:  07/24/2018 10:27      Subjective: Pt says that she feels better. She would like to go back to SNF.  Pt denies CP and SOB.    Discharge Exam: Vitals:   08/02/18 2112 08/03/18 0507  BP: 109/63 (!) 153/76  Pulse: 79 (!) 54  Resp: (!) 24 (!) 24  Temp: 98.3 F (36.8 C) 97.7 F (36.5 C)  SpO2: 99% 96%   Vitals:   08/02/18 2028 08/02/18 2112 08/03/18 0500 08/03/18 0507  BP:  109/63  (!) 153/76  Pulse:  79  (!) 54  Resp:  (!) 24  (!) 24  Temp:  98.3 F (36.8 C)  97.7 F (36.5 C)  TempSrc:  Oral  Oral  SpO2: 96% 99%  96%  Weight:   106.4 kg   Height:       General exam: awake, alert, NAD. Cooperative.  Respiratory system: mostly clear to auscultation, no crackles heard. No increased work of breathing. Cardiovascular system: S1 & S2 heard. No JVD, murmurs, gallops, clicks or pedal edema. Gastrointestinal system: Abdomen is nondistended, soft and nontender. Normal bowel sounds heard. Central nervous system: Alert and oriented. No focal neurological deficits. Extremities: right BKA. Trace pretibial edema LLE.   The results of significant diagnostics from this hospitalization (including imaging, microbiology, ancillary and  laboratory) are listed below for reference.     Microbiology: Recent Results (from the past 240 hour(s))  MRSA PCR Screening     Status: Abnormal   Collection Time: 08/01/18  6:00 AM  Result Value Ref Range Status   MRSA by PCR POSITIVE (A) NEGATIVE Final    Comment:        The GeneXpert MRSA Assay (FDA approved for NASAL specimens only), is one component of a comprehensive MRSA colonization surveillance program. It is not intended to diagnose MRSA infection nor to guide or monitor treatment for MRSA infections. RESULT CALLED TO, READ BACK BY AND VERIFIED WITH: JACKSON N. AT 0829A ON [id number] BY THOMPSON S. Performed at [facility], [address] [place], [place] [address]      Labs: BNP (last 3 results) Recent Labs    08/20/17 1603  07/31/18 1700  BNP 387.0* 381.8*   Basic Metabolic Panel: Recent Labs  Lab 07/31/18 1700 08/01/18 0328 08/01/18 1643 08/02/18 0438 08/03/18 0444  NA 138 140 139 141 140  K 5.6* 5.3* 4.8 4.0 4.7  CL 105 106 102 102 99  CO2 24 26 27 29  32  GLUCOSE 162* 151* 153* 131* 120*  BUN 80* 83* 81* 79* 91*  CREATININE 3.45* 3.35* 3.37* 3.40* 3.71*  CALCIUM 8.0* 7.8* 8.0* 8.0* 8.0*  MG 2.4  --   --   --   --   PHOS 6.0*  --  6.1* 6.1* 6.2*   Liver Function Tests: Recent Labs  Lab 08/01/18 1643 08/02/18 0438 08/03/18 0444  ALBUMIN 3.1* 2.9* 2.7*   No results for input(s): LIPASE, AMYLASE in the last 168 hours. No results for input(s): AMMONIA in the last 168 hours. CBC: Recent Labs  Lab 07/31/18 1700 08/01/18 0328  WBC 6.2 6.7  NEUTROABS 4.4  --   HGB 8.6* 7.6*  HCT 27.9* 25.1*  MCV 93.3 94.4  PLT 190 169   Cardiac Enzymes: Recent Labs  Lab 07/31/18 2150 08/01/18 0328 08/01/18 0950  TROPONINI <0.03 <0.03 <0.03   BNP: Invalid input(s): POCBNP CBG: Recent Labs  Lab 08/02/18 1136 08/02/18 1640 08/02/18 2108 08/03/18 0729 08/03/18 1057  GLUCAP 195* 172* 210* 104* 267*   D-Dimer No results for input(s): DDIMER in the last 72 hours. Hgb A1c No results for input(s): HGBA1C in the last 72 hours. Lipid Profile No results for input(s): CHOL, HDL, LDLCALC, TRIG, CHOLHDL, LDLDIRECT in the last 72 hours. Thyroid function studies Recent Labs    08/01/18 0951  TSH 6.361*   Anemia work up Recent Labs    08/01/18 0950  TIBC 255  IRON 20*   Urinalysis    Component Value Date/Time   COLORURINE YELLOW 08/20/2017 1536   APPEARANCEUR CLOUDY (A) 08/20/2017 1536   LABSPEC 1.019 08/20/2017 1536   PHURINE 5.0 08/20/2017 1536   GLUCOSEU 150 (A) 08/20/2017 1536   HGBUR SMALL (A) 08/20/2017 1536   BILIRUBINUR NEGATIVE 08/20/2017 1536   KETONESUR NEGATIVE 08/20/2017 1536   PROTEINUR >=300 (A) 08/20/2017 1536   UROBILINOGEN 0.2 01/13/2014 0232   NITRITE NEGATIVE  08/20/2017 1536   LEUKOCYTESUR NEGATIVE 08/20/2017 1536   Sepsis Labs Invalid input(s): PROCALCITONIN,  WBC,  LACTICIDVEN Microbiology Recent Results (from the past 240 hour(s))  MRSA PCR Screening     Status: Abnormal   Collection Time: 08/01/18  6:00 AM  Result Value Ref Range Status   MRSA by PCR POSITIVE (A) NEGATIVE Final    Comment:        The GeneXpert  MRSA Assay (FDA approved for NASAL specimens only), is one component of a comprehensive MRSA colonization surveillance program. It is not intended to diagnose MRSA infection nor to guide or monitor treatment for MRSA infections. RESULT CALLED TO, READ BACK BY AND VERIFIED WITH: JACKSON N. AT 0829A ON [id number] BY THOMPSON S. Performed at [facility], [address], [place], [place] [address]    Time coordinating discharge: 35 mins  SIGNED:  Irwin Brakeman, MD  [facility] 08/03/2018, 12:18 PM How to contact the [facility] Attending or Consulting provider Jeff or covering provider during after hours [place], for this patient?  1. Check the care team in [facility] and look for a) attending/consulting [facility] provider listed and b) the [facility] team listed 2. Log into [email or web address] and use [facility]'s universal password to access. If you do not have the password, please contact the hospital operator. 3. Locate the [facility] provider you are looking for under [facility] and page to a number that you can be directly reached. 4. If you still have difficulty reaching the provider, please page the [facility] (Director on Call) for the Hospitalists listed on amion for assistance.

## 2018-08-03 NOTE — NC FL2 (Signed)
[place] LEVEL OF CARE SCREENING TOOL     IDENTIFICATION  Patient Name: Mary [name] Birthdate: 04-06-1946 Sex: female Admission Date (Current Location): 07/31/2018  [facility] and [place] Number:  [employer] and Address:  [facility] [address] [place]      Provider Number: [id number]  Attending Physician Name and Address:  Murlean Iba, MD  Relative Name and Phone Number:       Current Level of Care: Hospital Recommended Level of Care: [place] Prior Approval Number:    Date Approved/Denied:   PASRR Number:    Discharge Plan: SNF    Current Diagnoses: Patient Active Problem List   Diagnosis Date Noted  . Anemia in chronic kidney disease (CKD) 08/01/2018  . Pressure injury of skin 07/31/2018  . Multiple falls 08/20/2017  . Fall at home   . Pericardial effusion 12/15/2016  . Bilateral carotid bruits 12/15/2016  . Cardiomyopathy ([place])   . Left ventricular dysfunction   . Acute on chronic combined systolic and diastolic CHF (congestive heart failure) ([place])   . Right ventricular dysfunction   . CKD (chronic kidney disease), stage IV ([place]) 11/30/2016  . CHF exacerbation ([place]) 11/28/2016  . Fall 11/28/2016  . Acute on chronic diastolic CHF (congestive heart failure) ([place]) 11/28/2016  . Elevated troponin 07/19/2015  . Chronic diastolic CHF (congestive heart failure) ([place]) 01/13/2014  . Expressive aphasia 01/13/2014  . CHF (congestive heart failure) ([place])   . Unstable balance 01/16/2013  . Difficulty in walking(719.7) 01/16/2013  . Infected wound 01/16/2013  . Hx of right BKA ([place]) 12/23/2012  . Candidiasis of breast 12/08/2012  . S/P bilateral BKA (below knee amputation) ([place]) 11/30/2012  . Diabetes ([place]) 11/30/2012  . Diabetic foot ulcer ([place]) 10/08/2012  . Anxiety 02/17/2012  . Osteomyelitis of right foot ([place]) 02/14/2012  . Hematuria, microscopic 02/13/2012  . PVD (peripheral  vascular disease) ([place]) 02/08/2012  . Open wound of heel 02/08/2012  . Acute hyperkalemia 11/11/2011  . Hyperlipidemia 11/11/2011  . Hypertension 11/11/2011  . Morbid obesity ([place]) 11/11/2011  . Tachycardia 08/04/2011  . Acute on chronic diastolic heart failure ([place]) 07/25/2011  . Diabetes mellitus with neuropathy 07/25/2011  . OSA (obstructive sleep apnea) 07/25/2011  . Physical debility 07/25/2011  . Acute respiratory failure ([place]) 07/25/2011  . Restless leg syndrome 07/25/2011  . Anemia 07/25/2011    Orientation RESPIRATION BLADDER Height & Weight     Self, Time, Situation, Place  Normal Incontinent Weight: 234 lb 9.1 oz (106.4 kg) Height:  5\' 10"  (177.8 cm)  BEHAVIORAL SYMPTOMS/MOOD NEUROLOGICAL BOWEL NUTRITION STATUS      Continent Diet(heart healthy/carb modified. Fluid restriction: 1500ML fluid)  AMBULATORY STATUS COMMUNICATION OF NEEDS Skin   Extensive Assist Verbally Normal                       Personal Care Assistance Level of Assistance  Bathing, Feeding, Dressing Bathing Assistance: Limited assistance Feeding assistance: Independent Dressing Assistance: Limited assistance     Functional Limitations Info  Sight, Hearing, Speech Sight Info: Adequate Hearing Info: Adequate Speech Info: Adequate    SPECIAL CARE FACTORS FREQUENCY                       Contractures Contractures Info: Not present    Additional Factors Info  Code Status, Allergies, Psychotropic, Isolation Precautions Code Status Info: Full Code Allergies Info: NKA Psychotropic Info: Zoloft, Valium  Isolation Precautions Info: 08/01/18 MRSA PCR positive        Current Medications (08/03/2018):  This is the current hospital active medication list Current Facility-Administered Medications  Medication Dose Route Frequency Provider Last Rate Last Dose  . acetaminophen (TYLENOL) tablet 650 mg  650 mg Oral Q6H PRN Emokpae, Ejiroghene E, MD       Or  . acetaminophen (TYLENOL)  suppository 650 mg  650 mg Rectal Q6H PRN Emokpae, Ejiroghene E, MD      . amLODipine (NORVASC) tablet 10 mg  10 mg Oral Daily Emokpae, Ejiroghene E, MD   10 mg at 08/03/18 0846  . calcium acetate (PHOSLO) capsule 1,334 mg  1,334 mg Oral TID WC Edrick Oh, MD   1,334 mg at 08/03/18 1218  . Chlorhexidine Gluconate Cloth 2 % PADS 6 each  6 each Topical Q0600 Murlean Iba, MD   6 each at 08/03/18 0555  . diazepam (VALIUM) tablet 2 mg  2 mg Oral BID Emokpae, Ejiroghene E, MD   2 mg at 08/03/18 0846  . doxazosin (CARDURA) tablet 4 mg  4 mg Oral QHS Emokpae, Ejiroghene E, MD   4 mg at 08/02/18 2134  . ferric gluconate (NULECIT) 125 mg in sodium chloride 0.9 % 100 mL IVPB  125 mg Intravenous Daily Edrick Oh, MD 110 mL/hr at 08/03/18 1029 125 mg at 08/03/18 1029  . ferrous sulfate tablet 325 mg  325 mg Oral Daily Emokpae, Ejiroghene E, MD   325 mg at 08/03/18 0846  . furosemide (LASIX) tablet 160 mg  160 mg Oral BID Edrick Oh, MD      . heparin injection 5,000 Units  5,000 Units Subcutaneous Q8H Emokpae, Ejiroghene E, MD   5,000 Units at 08/03/18 0554  . hydrALAZINE (APRESOLINE) injection 10 mg  10 mg Intravenous Q4H PRN Emokpae, Ejiroghene E, MD      . insulin aspart (novoLOG) injection 0-9 Units  0-9 Units Subcutaneous TID WC Emokpae, Ejiroghene E, MD   5 Units at 08/03/18 1142  . insulin detemir (LEVEMIR) injection 15 Units  15 Units Subcutaneous QHS Emokpae, Ejiroghene E, MD   15 Units at 08/02/18 2133  . magnesium oxide (MAG-OX) tablet 400 mg  400 mg Oral Daily Emokpae, Ejiroghene E, MD   400 mg at 08/03/18 0846  . metoprolol tartrate (LOPRESSOR) tablet 100 mg  100 mg Oral BID Emokpae, Ejiroghene E, MD   100 mg at 08/03/18 0846  . mupirocin ointment (BACTROBAN) 2 % 1 application  1 application Nasal BID Murlean Iba, MD   1 application at 16/96/78 0847  . ondansetron (ZOFRAN) tablet 4 mg  4 mg Oral Q6H PRN Emokpae, Ejiroghene E, MD       Or  . ondansetron (ZOFRAN) injection 4 mg   4 mg Intravenous Q6H PRN Emokpae, Ejiroghene E, MD      . oxyCODONE-acetaminophen (PERCOCET) 7.5-325 MG per tablet 1 tablet  1 tablet Oral Q8H PRN Emokpae, Ejiroghene E, MD   1 tablet at 08/02/18 2134  . polyethylene glycol (MIRALAX / GLYCOLAX) packet 17 g  17 g Oral Daily PRN Emokpae, Ejiroghene E, MD      . rOPINIRole (REQUIP) tablet 4 mg  4 mg Oral QHS Emokpae, Ejiroghene E, MD   4 mg at 08/02/18 2133  . sertraline (ZOLOFT) tablet 50 mg  50 mg Oral Daily Emokpae, Ejiroghene E, MD   50 mg at 08/03/18 0847  . tiZANidine (ZANAFLEX) tablet 4 mg  4 mg Oral TID Emokpae,  Ejiroghene E, MD   4 mg at 08/03/18 0847     Discharge Medications: Please see discharge summary for a list of discharge medications.  Relevant Imaging Results:  Relevant Lab Results:   Additional Information She will need daily weights and give a dose of metolazone should she gain more than 3 pounds in 24 hours. Follow closely by her primary physician and nephologist.  She will need to have a rescheduled appointment for AV fistula placement.  Please follow up with nephrologist in 1 week.  Please check CBC and BMP in 1 week.   Tennelle Taflinger, Clydene Pugh, LCSW

## 2018-08-03 NOTE — Progress Notes (Signed)
Report called to [facility] at this time

## 2018-08-03 NOTE — Progress Notes (Signed)
[facility] ROUNDING NOTE   Subjective:   Brief history.  This a lady with near end-stage renal disease stage IV/V chronic kidney disease.  She was due to see Dr. early 08/02/2018 for placement of AV fistula this is been canceled due to her hospitalization.  She has diastolic heart failure diabetes obstructive sleep apnea and status post right below-knee amputation with peripheral vascular disease and obesity.  She is in the [facility].  She presented to the emergency room with a 30 pound weight gain.  We have proceeded with diuresis using IV Lasix 160 mg every 6 hours and metolazone 5 mg daily.    He has had a tremendous diuresis with almost 3 L removed yesterday her weight is down 206 kg from 114 kg on admission.  Blood pressure 153/76 pulse 54 temperature 97.7 O2 sats 96% nasal cannula  Sodium 140 potassium 4.7 chloride 99 CO2 32 BUN 99 1 creatinine 3.71 glucose 130 calcium 8.0 phosphorus 6.2 albumin 2.7.  Medications amlodipine 10 mg daily Cardura 4 mg nightly Lasix 160 mg IV every 6 hours hydralazine 10 mg every 4 hours as needed metolazone 5 mg daily metoprolol 100 mg twice daily.  Zoloft 50 mg daily Valium 2 mg 2 tablets daily.  Levemir 15 units subcu at bedtime.  Calcium acetate 667 mg q. before meals.  Iron sulfate 325 mg daily.  Requip 4 mg nightly Zanaflex 4 mg 3 times daily magnesium oxide 400 mg daily  Objective:  Vital signs in last 24 hours:  Temp:  [97.7 F (36.5 C)-98.3 F (36.8 C)] 97.7 F (36.5 C) (03/05 0507) Pulse Rate:  [54-79] 54 (03/05 0507) Resp:  [17-24] 24 (03/05 0507) BP: (109-153)/(63-76) 153/76 (03/05 0507) SpO2:  [96 %-99 %] 96 % (03/05 0507) Weight:  [106.4 kg] 106.4 kg (03/05 0500)  Weight change: -2.3 kg Filed Weights   08/01/18 0500 08/02/18 0500 08/03/18 0500  Weight: 113 kg 108.7 kg 106.4 kg    Intake/Output: I/O last 3 completed shifts: In: 918 [P.O.:720; IV Piggyback:198] Out: 2750 [Urine:2750]   Intake/Output this shift:   Total I/O In: 240 [P.O.:240] Out: 1200 [Urine:1200] Alert and oriented appears to be doing well sitting out of bed eating breakfast [employer]-regular rate and rhythm with a faint 3 out of 6 systolic murmur left sternal edge RS-clear anteriorly some decreased air basal lung fields ABD- BS present soft non-distended EXT-trace edema lower extremities   Basic Metabolic Panel: Recent Labs  Lab 07/31/18 1700 08/01/18 0328 08/01/18 1643 08/02/18 0438 08/03/18 0444  NA 138 140 139 141 140  K 5.6* 5.3* 4.8 4.0 4.7  CL 105 106 102 102 99  CO2 24 26 27 29  32  GLUCOSE 162* 151* 153* 131* 120*  BUN 80* 83* 81* 79* 91*  CREATININE 3.45* 3.35* 3.37* 3.40* 3.71*  CALCIUM 8.0* 7.8* 8.0* 8.0* 8.0*  MG 2.4  --   --   --   --   PHOS 6.0*  --  6.1* 6.1* 6.2*    Liver Function Tests: Recent Labs  Lab 08/01/18 1643 08/02/18 0438 08/03/18 0444  ALBUMIN 3.1* 2.9* 2.7*   No results for input(s): LIPASE, AMYLASE in the last 168 hours. No results for input(s): AMMONIA in the last 168 hours.  CBC: Recent Labs  Lab 07/31/18 1700 08/01/18 0328  WBC 6.2 6.7  NEUTROABS 4.4  --   HGB 8.6* 7.6*  HCT 27.9* 25.1*  MCV 93.3 94.4  PLT 190 169    Cardiac Enzymes: Recent Labs  Lab 07/31/18 2150 08/01/18 0328 08/01/18 0950  TROPONINI <0.03 <0.03 <0.03    BNP: Invalid input(s): POCBNP  CBG: Recent Labs  Lab 08/02/18 0738 08/02/18 1136 08/02/18 1640 08/02/18 2108 08/03/18 0729  GLUCAP 125* 195* 172* 210* 104*    Microbiology: Results for orders placed or performed during the hospital encounter of 07/31/18  MRSA PCR Screening     Status: Abnormal   Collection Time: 08/01/18  6:00 AM  Result Value Ref Range Status   MRSA by PCR POSITIVE (A) NEGATIVE Final    Comment:        The GeneXpert MRSA Assay (FDA approved for NASAL specimens only), is one component of a comprehensive MRSA colonization surveillance program. It is not intended to diagnose MRSA infection nor to guide  or monitor treatment for MRSA infections. RESULT CALLED TO, READ BACK BY AND VERIFIED WITH: JACKSON N. AT 0829A ON [id number] BY THOMPSON S. Performed at [facility], [address], [place], [place] [address]     Coagulation Studies: No results for input(s): LABPROT, INR in the last 72 hours.  Urinalysis: No results for input(s): COLORURINE, LABSPEC, PHURINE, GLUCOSEU, HGBUR, BILIRUBINUR, KETONESUR, PROTEINUR, UROBILINOGEN, NITRITE, LEUKOCYTESUR in the last 72 hours.  Invalid input(s): APPERANCEUR    Imaging: No results found.   Medications:   . ferric gluconate (FERRLECIT/NULECIT) IV 125 mg (08/02/18 1447)  . furosemide 160 mg (08/03/18 0553)   . amLODipine  10 mg Oral Daily  . calcium acetate  667 mg Oral TID WC  . Chlorhexidine Gluconate Cloth  6 each Topical Q0600  . diazepam  2 mg Oral BID  . doxazosin  4 mg Oral QHS  . ferrous sulfate  325 mg Oral Daily  . heparin  5,000 Units Subcutaneous Q8H  . insulin aspart  0-9 Units Subcutaneous TID WC  . insulin detemir  15 Units Subcutaneous QHS  . magnesium oxide  400 mg Oral Daily  . metolazone  5 mg Oral Daily  . metoprolol tartrate  100 mg Oral BID  . mupirocin ointment  1 application Nasal BID  . rOPINIRole  4 mg Oral QHS  . sertraline  50 mg Oral Daily  . tiZANidine  4 mg Oral TID   acetaminophen **OR** acetaminophen, hydrALAZINE, ondansetron **OR** ondansetron (ZOFRAN) IV, oxyCODONE-acetaminophen, polyethylene glycol  Assessment/ Plan:   Chronic kidney disease stage IV/V.  She has plans for access placement as an outpatient I do not think there is any absolute indication for dialysis at this present time.  She is at great diuresis I think we can switch her to oral Lasix and will start at 160 mg twice daily.  She will need daily weights and addition of metolazone should she gain more than 3 pounds in 24 hours.  She wishes to be discharged back to the [facility] today.  I do not have a problem with this as long she is  followed closely by her primary physician.  She will need to have a rescheduled appointment for AV fistula placement  Anemia she has some iron deficiency will start IV iron  Bones PTH appears to be within goal continue to follow calcium and phosphorus.  She has hyperphosphatemia will increase calcium acetate to 2 tablets with meals  2D echo shows ejection fraction 60 to 65% probably is diastolic dysfunction  Hypertension/volume appears to be doing well with great diuresis will convert to oral Lasix with use of metolazone as needed  Peripheral vascular disease status post amputation stable  Diabetes mellitus  per primary team  Chronic pain multiple medications agree with addition of antidepressant medication and trying to limit the use of narcotic agents.  Hyperkalemia resolved   LOS: 2 Mary [name] @TODAY @10 :15 AM

## 2018-08-04 ENCOUNTER — Other Ambulatory Visit: Payer: Self-pay | Admitting: *Deleted

## 2018-08-04 ENCOUNTER — Encounter: Payer: Self-pay | Admitting: *Deleted

## 2018-08-14 ENCOUNTER — Encounter (HOSPITAL_COMMUNITY): Payer: Self-pay | Admitting: *Deleted

## 2018-08-14 ENCOUNTER — Other Ambulatory Visit: Payer: Self-pay

## 2018-08-14 ENCOUNTER — Other Ambulatory Visit: Payer: Self-pay | Admitting: *Deleted

## 2018-08-14 NOTE — Progress Notes (Signed)
Pt is a resident at [facility], [place]. Spoke with Anderson Malta, RN, Unit Manager for pre-op call. She states pt has not had any recent chest pain or sob. Pt is a type 2 diabetic. Her fasting blood sugar was 174 today. Anderson Malta could not find the A1C from December. Pre-op instructions faxed to [facility] at [phone].

## 2018-08-14 NOTE — Pre-Procedure Instructions (Signed)
   Mary [name]  08/14/2018   Mrs. Mary [name]'s procedure is scheduled on Wednesday, 08/16/18.   Report to [facility] Entrance "A" Admitting Office  at 7:45 AM.   Call this number if you have problems the morning of surgery: [phone]   Remember:  Patient is not to eat or drink after midnight Tuesday, 08/15/18  Take these medicines the morning of surgery with A SIP OF WATER: Amlodipine (Norvasc), Diazepam (Valium), Metoprolol (Lopressor), Oxycodone, Sertraline (Zoloft)  Please hold patient's Multivitamins as of today until after surgery.  Tuesday PM, please give Mrs. Mary [name] 1/2 of her regular dose of Levemir Insulin (give 7 units). Day of surgery, Wednesday, do not give patient her 70/30 insulin.  Please check pt's blood sugar the morning of surgery when she gets up and every 2 hours until she leaves for the hospital.  If blood sugar is 70 or below, treat with 1/2 cup of clear juice (apple or cranberry) and recheck blood sugar 15 minutes after drinking juice. If blood sugar continues to be 70 or below, call the Short Stay department and ask to speak to a nurse.  Instructions per Dr. Smith Robert, Anesthesiologist/[place]an [place]rook Stan Head, RN   Do not wear jewelry, make-up or nail polish.  Do not wear lotions, powders, perfumes or deodorant.  Do not shave 48 hours prior to surgery.    Do not bring valuables to the hospital.  [facility] is not responsible for any belongings or valuables.  Contacts, dentures or bridgework may not be worn into surgery.  Leave your suitcase in the car.  After surgery it may be brought to your room.  If any questions today or tomorrow (Tuesday), please call me, Lilia Pro, RN at [phone].

## 2018-08-16 ENCOUNTER — Other Ambulatory Visit: Payer: Self-pay

## 2018-08-16 ENCOUNTER — Encounter (HOSPITAL_COMMUNITY)
Admission: RE | Disposition: A | Discharge status: Home / Self Care | Payer: Self-pay | Source: Home / Self Care | Attending: Vascular Surgery

## 2018-08-16 ENCOUNTER — Ambulatory Visit (HOSPITAL_COMMUNITY)
Admission: RE | Admit: 2018-08-16 | Discharge: 2018-08-16 | Disposition: A | Discharge status: Home / Self Care | Payer: Medicare Other | Attending: Vascular Surgery | Admitting: Vascular Surgery

## 2018-08-16 ENCOUNTER — Ambulatory Visit (HOSPITAL_COMMUNITY): Payer: Medicare Other | Admitting: Anesthesiology

## 2018-08-16 ENCOUNTER — Encounter (HOSPITAL_COMMUNITY): Payer: Self-pay

## 2018-08-16 ENCOUNTER — Telehealth: Payer: Self-pay | Admitting: Vascular Surgery

## 2018-08-16 DIAGNOSIS — Z79899 Other long term (current) drug therapy: Secondary | ICD-10-CM | POA: Insufficient documentation

## 2018-08-16 DIAGNOSIS — N189 Chronic kidney disease, unspecified: Secondary | ICD-10-CM | POA: Diagnosis present

## 2018-08-16 DIAGNOSIS — Z885 Allergy status to narcotic agent status: Secondary | ICD-10-CM | POA: Insufficient documentation

## 2018-08-16 DIAGNOSIS — N184 Chronic kidney disease, stage 4 (severe): Secondary | ICD-10-CM

## 2018-08-16 DIAGNOSIS — E1140 Type 2 diabetes mellitus with diabetic neuropathy, unspecified: Secondary | ICD-10-CM | POA: Diagnosis not present

## 2018-08-16 DIAGNOSIS — M170 Bilateral primary osteoarthritis of knee: Secondary | ICD-10-CM | POA: Insufficient documentation

## 2018-08-16 DIAGNOSIS — N185 Chronic kidney disease, stage 5: Secondary | ICD-10-CM | POA: Diagnosis not present

## 2018-08-16 DIAGNOSIS — Z794 Long term (current) use of insulin: Secondary | ICD-10-CM | POA: Insufficient documentation

## 2018-08-16 DIAGNOSIS — E1122 Type 2 diabetes mellitus with diabetic chronic kidney disease: Secondary | ICD-10-CM | POA: Insufficient documentation

## 2018-08-16 DIAGNOSIS — F419 Anxiety disorder, unspecified: Secondary | ICD-10-CM | POA: Insufficient documentation

## 2018-08-16 DIAGNOSIS — D649 Anemia, unspecified: Secondary | ICD-10-CM | POA: Diagnosis not present

## 2018-08-16 DIAGNOSIS — I509 Heart failure, unspecified: Secondary | ICD-10-CM | POA: Insufficient documentation

## 2018-08-16 DIAGNOSIS — E1151 Type 2 diabetes mellitus with diabetic peripheral angiopathy without gangrene: Secondary | ICD-10-CM | POA: Insufficient documentation

## 2018-08-16 DIAGNOSIS — G4733 Obstructive sleep apnea (adult) (pediatric): Secondary | ICD-10-CM | POA: Insufficient documentation

## 2018-08-16 DIAGNOSIS — Z89511 Acquired absence of right leg below knee: Secondary | ICD-10-CM | POA: Diagnosis not present

## 2018-08-16 HISTORY — PX: AV FISTULA PLACEMENT: SHX1204

## 2018-08-16 HISTORY — DX: Anxiety disorder, unspecified: F41.9

## 2018-08-16 LAB — POCT I-STAT 4, (NA,K, GLUC, HGB,HCT)
Glucose, Bld: 197 mg/dL — ABNORMAL HIGH (ref 70–99)
HCT: 28 % — ABNORMAL LOW (ref 36.0–46.0)
Hemoglobin: 9.5 g/dL — ABNORMAL LOW (ref 12.0–15.0)
Potassium: 5.2 mmol/L — ABNORMAL HIGH (ref 3.5–5.1)
Sodium: 136 mmol/L (ref 135–145)

## 2018-08-16 LAB — GLUCOSE, CAPILLARY
GLUCOSE-CAPILLARY: 216 mg/dL — AB (ref 70–99)
Glucose-Capillary: 191 mg/dL — ABNORMAL HIGH (ref 70–99)

## 2018-08-16 SURGERY — ARTERIOVENOUS (AV) FISTULA CREATION
Anesthesia: Monitor Anesthesia Care | Site: Arm Lower | Laterality: Left

## 2018-08-16 MED ORDER — CEFAZOLIN SODIUM-DEXTROSE 2-4 GM/100ML-% IV SOLN
2.0000 g | INTRAVENOUS | Status: AC
  Administered 2018-08-16: 2 g via INTRAVENOUS
  Filled 2018-08-16: qty 100

## 2018-08-16 MED ORDER — LIDOCAINE HCL (PF) 0.5 % IJ SOLN
INTRAMUSCULAR | Status: AC
  Filled 2018-08-16: qty 50

## 2018-08-16 MED ORDER — ACETAMINOPHEN 500 MG PO TABS
500.0000 mg | ORAL_TABLET | Freq: Once | ORAL | Status: AC
  Administered 2018-08-16: 500 mg via ORAL
  Filled 2018-08-16: qty 1

## 2018-08-16 MED ORDER — SODIUM CHLORIDE 0.9 % IV SOLN
INTRAVENOUS | Status: AC
  Filled 2018-08-16: qty 1.2

## 2018-08-16 MED ORDER — MIDAZOLAM HCL 2 MG/2ML IJ SOLN
INTRAMUSCULAR | Status: AC
  Filled 2018-08-16: qty 2

## 2018-08-16 MED ORDER — PHENYLEPHRINE 40 MCG/ML (10ML) SYRINGE FOR IV PUSH (FOR BLOOD PRESSURE SUPPORT)
PREFILLED_SYRINGE | INTRAVENOUS | Status: AC
  Filled 2018-08-16: qty 20

## 2018-08-16 MED ORDER — ONDANSETRON HCL 4 MG/2ML IJ SOLN
INTRAMUSCULAR | Status: DC | PRN
  Administered 2018-08-16: 4 mg via INTRAVENOUS

## 2018-08-16 MED ORDER — LIDOCAINE 2% (20 MG/ML) 5 ML SYRINGE
INTRAMUSCULAR | Status: AC
  Filled 2018-08-16: qty 5

## 2018-08-16 MED ORDER — FENTANYL CITRATE (PF) 250 MCG/5ML IJ SOLN
INTRAMUSCULAR | Status: AC
  Filled 2018-08-16: qty 5

## 2018-08-16 MED ORDER — LIDOCAINE-EPINEPHRINE 0.5 %-1:200000 IJ SOLN
INTRAMUSCULAR | Status: DC | PRN
  Administered 2018-08-16: 50 mL

## 2018-08-16 MED ORDER — DEXAMETHASONE SODIUM PHOSPHATE 4 MG/ML IJ SOLN
INTRAMUSCULAR | Status: DC | PRN
  Administered 2018-08-16: 5 mg via INTRAVENOUS

## 2018-08-16 MED ORDER — DEXAMETHASONE SODIUM PHOSPHATE 10 MG/ML IJ SOLN
INTRAMUSCULAR | Status: AC
  Filled 2018-08-16: qty 1

## 2018-08-16 MED ORDER — METOPROLOL TARTRATE 50 MG PO TABS
100.0000 mg | ORAL_TABLET | Freq: Once | ORAL | Status: AC
  Administered 2018-08-16: 100 mg via ORAL
  Filled 2018-08-16: qty 2

## 2018-08-16 MED ORDER — MIDAZOLAM HCL 2 MG/2ML IJ SOLN
INTRAMUSCULAR | Status: DC | PRN
  Administered 2018-08-16: 2 mg via INTRAVENOUS

## 2018-08-16 MED ORDER — ONDANSETRON HCL 4 MG/2ML IJ SOLN: 4.0000 mg | Freq: Once | INTRAMUSCULAR | Status: DC | PRN

## 2018-08-16 MED ORDER — SODIUM CHLORIDE 0.9 % IV SOLN
INTRAVENOUS | Status: DC | PRN
  Administered 2018-08-16: 11:00:00

## 2018-08-16 MED ORDER — ONDANSETRON HCL 4 MG/2ML IJ SOLN
INTRAMUSCULAR | Status: AC
  Filled 2018-08-16: qty 2

## 2018-08-16 MED ORDER — PROPOFOL 500 MG/50ML IV EMUL
INTRAVENOUS | Status: DC | PRN
  Administered 2018-08-16: 75 ug/kg/min via INTRAVENOUS

## 2018-08-16 MED ORDER — PHENYLEPHRINE 40 MCG/ML (10ML) SYRINGE FOR IV PUSH (FOR BLOOD PRESSURE SUPPORT)
PREFILLED_SYRINGE | INTRAVENOUS | Status: AC
  Filled 2018-08-16: qty 10

## 2018-08-16 MED ORDER — 0.9 % SODIUM CHLORIDE (POUR BTL) OPTIME
TOPICAL | Status: DC | PRN
  Administered 2018-08-16: 1000 mL

## 2018-08-16 MED ORDER — LIDOCAINE HCL (CARDIAC) PF 100 MG/5ML IV SOSY
PREFILLED_SYRINGE | INTRAVENOUS | Status: DC | PRN
  Administered 2018-08-16: 100 mg via INTRAVENOUS

## 2018-08-16 MED ORDER — FENTANYL CITRATE (PF) 100 MCG/2ML IJ SOLN: 25.0000 ug | INTRAMUSCULAR | Status: DC | PRN

## 2018-08-16 MED ORDER — LIDOCAINE-EPINEPHRINE 0.5 %-1:200000 IJ SOLN
INTRAMUSCULAR | Status: AC
  Filled 2018-08-16: qty 1

## 2018-08-16 MED ORDER — SODIUM CHLORIDE 0.9 % IV SOLN
INTRAVENOUS | Status: DC
  Administered 2018-08-16: 11:00:00 via INTRAVENOUS
  Administered 2018-08-16: 25 mL/h via INTRAVENOUS

## 2018-08-16 MED ORDER — OXYCODONE-ACETAMINOPHEN 7.5-325 MG PO TABS: 1.0000 | ORAL_TABLET | Freq: Three times a day (TID) | ORAL | 0 refills | Status: AC | PRN

## 2018-08-16 MED ORDER — FENTANYL CITRATE (PF) 100 MCG/2ML IJ SOLN
INTRAMUSCULAR | Status: DC | PRN
  Administered 2018-08-16 (×2): 25 ug via INTRAVENOUS
  Administered 2018-08-16: 50 ug via INTRAVENOUS

## 2018-08-16 MED ORDER — PROPOFOL 10 MG/ML IV BOLUS
INTRAVENOUS | Status: DC | PRN
  Administered 2018-08-16 (×2): 30 mg via INTRAVENOUS
  Administered 2018-08-16: 40 mg via INTRAVENOUS
  Administered 2018-08-16: 20 mg via INTRAVENOUS

## 2018-08-16 MED ORDER — AMLODIPINE BESYLATE 5 MG PO TABS
10.0000 mg | ORAL_TABLET | Freq: Once | ORAL | Status: AC
  Administered 2018-08-16: 10 mg via ORAL
  Filled 2018-08-16: qty 2

## 2018-08-16 SURGICAL SUPPLY — 37 items
ARMBAND PINK RESTRICT EXTREMIT (MISCELLANEOUS) ×3 IMPLANT
CANISTER SUCT 3000ML PPV (MISCELLANEOUS) ×3 IMPLANT
CANNULA VESSEL 3MM 2 BLNT TIP (CANNULA) ×3 IMPLANT
CLIP LIGATING EXTRA MED SLVR (CLIP) ×3 IMPLANT
CLIP LIGATING EXTRA SM BLUE (MISCELLANEOUS) ×3 IMPLANT
COVER MAYO STAND STRL (DRAPES) ×3 IMPLANT
COVER PROBE W GEL 5X96 (DRAPES) ×3 IMPLANT
COVER WAND RF STERILE (DRAPES) ×3 IMPLANT
DECANTER SPIKE VIAL GLASS SM (MISCELLANEOUS) ×3 IMPLANT
DERMABOND ADVANCED (GAUZE/BANDAGES/DRESSINGS) ×2
DERMABOND ADVANCED .7 DNX12 (GAUZE/BANDAGES/DRESSINGS) ×1 IMPLANT
ELECT REM PT RETURN 9FT ADLT (ELECTROSURGICAL) ×3
ELECTRODE REM PT RTRN 9FT ADLT (ELECTROSURGICAL) ×1 IMPLANT
GLOVE BIO SURGEON STRL SZ 6.5 (GLOVE) ×4 IMPLANT
GLOVE BIO SURGEONS STRL SZ 6.5 (GLOVE) ×2
GLOVE BIOGEL PI IND STRL 6.5 (GLOVE) ×1 IMPLANT
GLOVE BIOGEL PI IND STRL 7.0 (GLOVE) ×1 IMPLANT
GLOVE BIOGEL PI IND STRL 7.5 (GLOVE) ×2 IMPLANT
GLOVE BIOGEL PI INDICATOR 6.5 (GLOVE) ×2
GLOVE BIOGEL PI INDICATOR 7.0 (GLOVE) ×2
GLOVE BIOGEL PI INDICATOR 7.5 (GLOVE) ×4
GLOVE SS BIOGEL STRL SZ 7.5 (GLOVE) ×1 IMPLANT
GLOVE SUPERSENSE BIOGEL SZ 7.5 (GLOVE) ×2
GOWN STRL REUS W/ TWL LRG LVL3 (GOWN DISPOSABLE) ×3 IMPLANT
GOWN STRL REUS W/TWL LRG LVL3 (GOWN DISPOSABLE) ×6
KIT BASIN OR (CUSTOM PROCEDURE TRAY) ×3 IMPLANT
KIT TURNOVER KIT B (KITS) ×3 IMPLANT
NS IRRIG 1000ML POUR BTL (IV SOLUTION) ×3 IMPLANT
PACK CV ACCESS (CUSTOM PROCEDURE TRAY) ×3 IMPLANT
PAD ARMBOARD 7.5X6 YLW CONV (MISCELLANEOUS) ×6 IMPLANT
SPONGE LAP 18X18 X RAY DECT (DISPOSABLE) ×3 IMPLANT
SUT PROLENE 6 0 CC (SUTURE) ×6 IMPLANT
SUT VIC AB 3-0 SH 27 (SUTURE) ×2
SUT VIC AB 3-0 SH 27X BRD (SUTURE) ×1 IMPLANT
TOWEL GREEN STERILE (TOWEL DISPOSABLE) ×3 IMPLANT
UNDERPAD 30X30 (UNDERPADS AND DIAPERS) ×3 IMPLANT
WATER STERILE IRR 1000ML POUR (IV SOLUTION) ×3 IMPLANT

## 2018-08-16 NOTE — Interval H&P Note (Signed)
History and Physical Interval Note:  08/16/2018 8:48 AM  Mary [name]  has presented today for surgery, with the diagnosis of CHRONIC KIDNEY DISEASE FOR HEMODIALYSIS ACCESS.  The various methods of treatment have been discussed with the patient and family. After consideration of risks, benefits and other options for treatment, the patient has consented to  Procedure(s): CREATION OF ARTERIOVENOUS FISTULA LEFT ARM (Left) as a surgical intervention.  The patient's history has been reviewed, patient examined, no change in status, stable for surgery.  I have reviewed the patient's chart and labs.  Questions were answered to the patient's satisfaction.     Curt Jews

## 2018-08-16 NOTE — Progress Notes (Signed)
Spoke with Donia Pounds, RN at [facility] regarding pt's meds. RN states that pt did not take the meds listed on the pre-op instructions. Pt only received Valium at 0500. Pt states she does not know why the night RN did not administer those meds. RN states that pt did receive only 7 units of levemir last night as stated in pre-op instructions. Pt's BP on arrival to [facility] was 204/71, HR 54. Spoke with Dr. Linna Caprice and received a verbal order to administer pt's amlodipine and metoprolol.   Jacqlyn Larsen, RN

## 2018-08-16 NOTE — Transfer of Care (Signed)
Immediate Anesthesia Transfer of Care Note  Patient: Mary [name]  Procedure(s) Performed: CREATION OF ARTERIOVENOUS FISTULA LEFT ARM (Left Arm Lower)  Patient Location: PACU  Anesthesia Type:MAC  Level of Consciousness: awake, alert  and oriented  Airway & Oxygen Therapy: Patient Spontanous Breathing and Patient connected to face mask oxygen  Post-op Assessment: Report given to RN, Post -op Vital signs reviewed and stable and Patient moving all extremities  Post vital signs: Reviewed and stable  Last Vitals:  Vitals Value Taken Time  BP 124/52 08/16/2018 11:52 AM  Temp    Pulse 57 08/16/2018 11:55 AM  Resp 14 08/16/2018 11:55 AM  SpO2 95 % 08/16/2018 11:55 AM  Vitals shown include unvalidated device data.  Last Pain:  Vitals:   08/16/18 0846  TempSrc:   PainSc: 0-No pain         Complications: No apparent anesthesia complications

## 2018-08-16 NOTE — Telephone Encounter (Signed)
-----   Message from Gabriel Earing, [place] sent at 08/16/2018 11:39 AM EDT ----- S/p left BC AVF 08/16/2018.  F/u with PA with duplex in 6 weeks.  Thanks

## 2018-08-16 NOTE — Anesthesia Preprocedure Evaluation (Addendum)
Anesthesia Evaluation  Patient identified by MRN, date of birth, ID band Patient awake    Reviewed: Allergy & Precautions, NPO status , Patient's Chart, lab work & pertinent test results  Airway Mallampati: II  TM Distance: >3 FB Neck ROM: Full    Dental  (+) Lower Dentures, Upper Dentures   Pulmonary sleep apnea ,    Pulmonary exam normal breath sounds clear to auscultation       Cardiovascular hypertension, Pt. on medications and Pt. on home beta blockers + Peripheral Vascular Disease and +CHF  Normal cardiovascular exam+ dysrhythmias  Rhythm:Regular Rate:Normal  ECG: SR, rate 62  ECHO: 1. The left ventricle has normal systolic function with an ejection fraction of 60-65%. The cavity size was normal. There is mildly increased left ventricular wall thickness. abnormal, indeterminant grade Elevated mean left atrial pressure. 2. The right ventricle has normal systolic function. The cavity was normal. There is no increase in right ventricular wall thickness. 3. Left atrial size was moderately dilated. 4. The aortic valve is tricuspid Mild thickening of the aortic valve Mild calcification of the aortic valve. no stenosis of the aortic valve. Moderate aortic annular calcification noted. 5. The mitral valve is normal in structure. Mild thickening of the mitral valve leaflet. Mild calcification of the mitral valve leaflet. There is mild mitral annular calcification present. No evidence of mitral valve stenosis. 6. The tricuspid valve is normal in structure. 7. The aortic root is normal in size and structure.     Neuro/Psych PSYCHIATRIC DISORDERS Anxiety negative neurological ROS     GI/Hepatic negative GI ROS, Neg liver ROS,   Endo/Other  diabetes, Insulin Dependent  Renal/GU CRFRenal disease     Musculoskeletal negative musculoskeletal ROS (+) Restless leg syndrome   Abdominal (+) + obese,   Peds  Hematology  (+) anemia  ,   Anesthesia Other Findings CHRONIC KIDNEY DISEASE FOR HEMODIALYSIS ACCESS  Reproductive/Obstetrics                            Anesthesia Physical Anesthesia Plan  ASA: III  Anesthesia Plan: MAC   Post-op Pain Management:    Induction: Intravenous  PONV Risk Score and Plan: 2 and Propofol infusion, Treatment may vary due to age or medical condition, Ondansetron and Dexamethasone  Airway Management Planned: Simple Face Mask  Additional Equipment:   Intra-op Plan:   Post-operative Plan:   Informed Consent: I have reviewed the patients History and Physical, chart, labs and discussed the procedure including the risks, benefits and alternatives for the proposed anesthesia with the patient or authorized representative who has indicated his/her understanding and acceptance.     Dental advisory given  Plan Discussed with: CRNA  Anesthesia Plan Comments:         Anesthesia Quick Evaluation

## 2018-08-16 NOTE — Op Note (Signed)
    OPERATIVE REPORT  DATE OF SURGERY: 08/16/2018  PATIENT: Mary [name], 73 y.o. female MRN: [id number]  DOB: Mar 06, 1946  PRE-OPERATIVE DIAGNOSIS: Chronic renal insufficiency  POST-OPERATIVE DIAGNOSIS:  Same  PROCEDURE: Left upper arm AV fistula creation, brachiocephalic  SURGEON:  Curt Jews, M.D.  PHYSICIAN ASSISTANT: Liana Crocker, PA-C  ANESTHESIA: Local with sedation  EBL: per anesthesia record  No intake/output data recorded.  BLOOD ADMINISTERED: none  DRAINS: none  SPECIMEN: none  COUNTS CORRECT:  YES  PATIENT DISPOSITION:  PACU - hemodynamically stable  PROCEDURE DETAILS: Patient was taken the operating placed supine position with area of the left arm prepped draped in sterile fashion.  Using local anesthesia incision made over the antecubital space and carried down to isolate the cephalic vein which was of moderate size.  The vein was ligated at the basilic vein junction and was divided.  The vein was marked to reduce risk of twisting.  The vein was gently dilated and was adequate for fistula creation.  The brachial artery was exposed through the same incision was of large caliber with minimal atherosclerotic change.  The artery was occluded proximally distally and was opened with 11 blade and sent longitudinally with Potts scissors.  The vein was spatulated and sewn end-to-side to the artery with a running 6-0 Prolene suture.  Clamps removed and good thrill was noted through the vein.  The patient maintained a left radial pulse.  Wounds irrigated with saline.  Hemostasis electrocautery.  Wounds were closed with 3-0 Vicryl in the subcutaneous and subcuticular tissue.  Sterile dressing was applied and the patient was transferred to the recovery room in stable condition   Rosetta Posner, M.D., [facility] 08/16/2018 12:11 PM

## 2018-08-16 NOTE — Discharge Instructions (Signed)
° °  [facility]  Discharge Instructions  AV Fistula or Graft Surgery for Dialysis Access  Please refer to the following instructions for your post-procedure care. Your surgeon or physician assistant will discuss any changes with you.  Activity  You may drive the day following your surgery, if you are comfortable and no longer taking prescription pain medication. Resume full activity as the soreness in your incision resolves.  Bathing/Showering  You may shower after you go home. Keep your incision dry for 48 hours. Do not soak in a bathtub, hot tub, or swim until the incision heals completely. You may not shower if you have a hemodialysis catheter.  Incision Care  Clean your incision with mild soap and water after 48 hours. Pat the area dry with a clean towel. You do not need a bandage unless otherwise instructed. Do not apply any ointments or creams to your incision. You may have skin glue on your incision. Do not peel it off. It will come off on its own in about one week. Your arm may swell a bit after surgery. To reduce swelling use pillows to elevate your arm so it is above your heart. Your doctor will tell you if you need to lightly wrap your arm with an ACE bandage.  Diet  Resume your normal diet. There are not special food restrictions following this procedure. In order to heal from your surgery, it is CRITICAL to get adequate nutrition. Your body requires vitamins, minerals, and protein. Vegetables are the best source of vitamins and minerals. Vegetables also provide the perfect balance of protein. Processed food has little nutritional value, so try to avoid this.  Medications  Resume taking all of your medications. If your incision is causing pain, you may take over-the counter pain relievers such as acetaminophen (Tylenol). If you were prescribed a stronger pain medication, please be aware these medications can cause nausea and constipation. Prevent  nausea by taking the medication with a snack or meal. Avoid constipation by drinking plenty of fluids and eating foods with high amount of fiber, such as fruits, vegetables, and grains.  Do not take Tylenol if you are taking prescription pain medications.  Follow up Your surgeon may want to see you in the office following your access surgery. If so, this will be arranged at the time of your surgery.  Please call us immediately for any of the following conditions:  Increased pain, redness, drainage (pus) from your incision site Fever of 101 degrees or higher Severe or worsening pain at your incision site Hand pain or numbness.  Reduce your risk of vascular disease:  Stop smoking. If you would like help, call [facility] at 1-800-QUIT-NOW 702-549-5468) or [place] at [place] your cholesterol Maintain a desired weight Control your diabetes Keep your blood pressure down  Dialysis  It will take several weeks to several months for your new dialysis access to be ready for use. Your surgeon will determine when it is okay to use it. Your nephrologist will continue to direct your dialysis. You can continue to use your Permcath until your new access is ready for use.   08/16/2018 Mary [name] [id number] 1945-07-26  Surgeon(s): Early, Arvilla Meres, MD  Procedure(s): Creation left brachiocephalic AV fistula  x Do not stick fistula for 12 weeks    If you have any questions, please call the office at [phone].

## 2018-08-16 NOTE — Anesthesia Postprocedure Evaluation (Signed)
Anesthesia Post Note  Patient: Mary [name]  Procedure(s) Performed: CREATION OF ARTERIOVENOUS FISTULA LEFT ARM (Left Arm Lower)     Patient location during evaluation: PACU Anesthesia Type: MAC Level of consciousness: awake and alert Pain management: pain level controlled Vital Signs Assessment: post-procedure vital signs reviewed and stable Respiratory status: spontaneous breathing, nonlabored ventilation, respiratory function stable and patient connected to nasal cannula oxygen Cardiovascular status: stable and blood pressure returned to baseline Postop Assessment: no apparent nausea or vomiting Anesthetic complications: no    Last Vitals:  Vitals:   08/16/18 1225 08/16/18 1230  BP: (!) 151/61 (!) 156/57  Pulse: (!) 58 (!) 58  Resp: 17 18  Temp: 36.6 C   SpO2: 94% 97%    Last Pain:  Vitals:   08/16/18 1230  TempSrc:   PainSc: 0-No pain                 Ryan P Ellender

## 2018-08-16 NOTE — Progress Notes (Signed)
Called the [facility] and spoke with Bronson Curb, RN who took report on patient, DC packet to be placed in envelope and transported with patient and driver back to the [facility] in [place].  Rowe Pavy, RN

## 2018-08-16 NOTE — Telephone Encounter (Signed)
sch appt spk to pt caregiver 09-26-2018 10am Dialysis Duplex 1045am p/o NP

## 2018-08-17 ENCOUNTER — Encounter (HOSPITAL_COMMUNITY): Payer: Self-pay | Admitting: Vascular Surgery

## 2018-09-12 ENCOUNTER — Other Ambulatory Visit: Payer: Self-pay

## 2018-09-12 DIAGNOSIS — N184 Chronic kidney disease, stage 4 (severe): Secondary | ICD-10-CM

## 2018-09-19 IMAGING — CR DG CHEST 1V
1 series · 1 of 1 positions shown · non-contrast
Comparison: 07/19/2015

CLINICAL DATA: Fall, left shoulder pain

EXAM:
CHEST 1 VIEW

[ap]
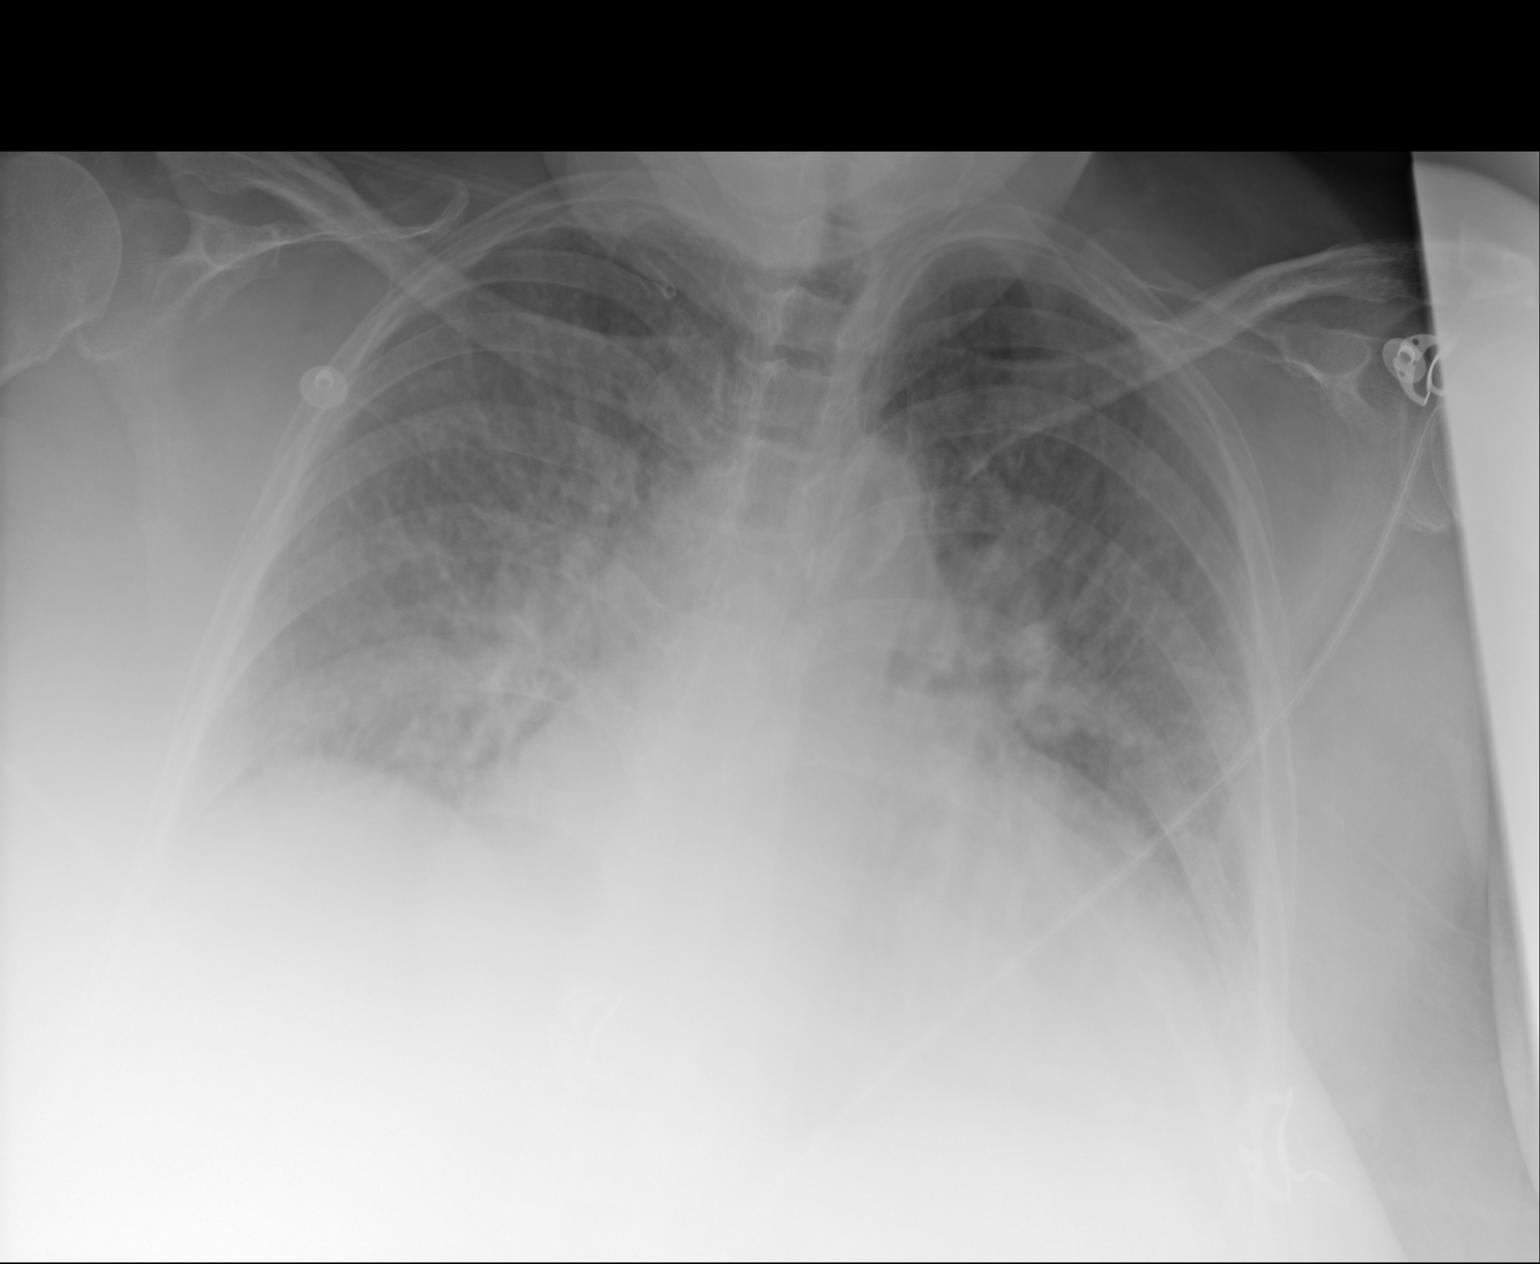

[1 of 1 positions shown; findings below may reference images not displayed]

FINDINGS: Cardiomegaly with vascular congestion and bilateral interstitial
opacities compatible with interstitial edema. No concha *********
head no effusions or acute bony abnormality.
IMPRESSION: Cardiomegaly with vascular congestion and worsening interstitial
prominence, likely interstitial edema.

## 2018-09-25 ENCOUNTER — Ambulatory Visit (HOSPITAL_COMMUNITY): Payer: Medicare Other

## 2018-09-25 ENCOUNTER — Encounter: Payer: Self-pay | Admitting: Family

## 2018-09-26 ENCOUNTER — Encounter: Payer: Self-pay | Admitting: Family

## 2018-09-26 ENCOUNTER — Encounter (HOSPITAL_COMMUNITY): Payer: Self-pay

## 2018-10-02 ENCOUNTER — Ambulatory Visit (HOSPITAL_COMMUNITY)
Admission: RE | Admit: 2018-10-02 | Discharge: 2018-10-02 | Disposition: A | Discharge status: Home / Self Care | Payer: Medicare Other | Source: Ambulatory Visit | Attending: Vascular Surgery | Admitting: Vascular Surgery

## 2018-10-02 ENCOUNTER — Other Ambulatory Visit: Payer: Self-pay

## 2018-10-02 DIAGNOSIS — N184 Chronic kidney disease, stage 4 (severe): Secondary | ICD-10-CM | POA: Diagnosis present

## 2018-10-03 ENCOUNTER — Telehealth: Payer: Self-pay | Admitting: Vascular Surgery

## 2018-10-03 ENCOUNTER — Encounter: Payer: Self-pay | Admitting: Vascular Surgery

## 2018-10-03 ENCOUNTER — Ambulatory Visit: Payer: Medicare Other | Admitting: Vascular Surgery

## 2018-10-03 NOTE — Telephone Encounter (Signed)
I spoke with the nursing supervisor at the skilled nursing facility where Mary [name] is a resident.  Her fistula incision is well-healed and she has no pain.  Her creatinine on 09/28/2018 was 3.72 with no indication for hemodialysis at this time  She did have a ultrasound of her fistula in our office yesterday showing some tortuosity but patency of her fistula.  We will see her again in 6 weeks in the office for office visit follow-up

## 2018-11-14 ENCOUNTER — Ambulatory Visit: Payer: Medicare Other | Admitting: Vascular Surgery

## 2018-11-29 ENCOUNTER — Telehealth: Payer: Self-pay | Admitting: *Deleted

## 2018-11-29 NOTE — Telephone Encounter (Signed)
Virtual Visit Pre-Appointment Phone Call  Today, I spoke with Mary [name] @ [facility] and performed the following actions:  1. I explained that we are currently trying to limit exposure to the COVID-19 virus by seeing patients at home rather than in the office.  I explained that the visits are best done by video, but can be done by telephone.  I asked the patient if a virtual visit that the patient would like to try instead of coming into the office. Mary [name] agreed to proceed with the phone visit scheduled with Dr. Curt Jews  On 12/05/18.      2. I confirmed the BEST phone number to call the day of the visit and- I included this in appointment notes.  3. I asked if the patient had access to (through a family member/friend) a smartphone with video capability to be used for her visit?"  The patient said yes -    4. I confirmed consent by  a. sending through [place] or by email the [place] as written at the end of this message or  b. verbally as listed below. i. This visit is being performed in the setting of COVID-19. ii. All virtual visits are billed to your insurance company just like a normal visit would be.   iii. We'd like you to understand that the technology does not allow for your provider to perform an examination, and thus may limit your provider's ability to fully assess your condition.  iv. If your provider identifies any concerns that need to be evaluated in person, we will make arrangements to do so.   v. Finally, though the technology is pretty good, we cannot assure that it will always work on either your or our end, and in the setting of a video visit, we may have to convert it to a phone-only visit.  In either situation, we cannot ensure that we have a secure connection.   vi. Are you willing to proceed?"  STAFF: Did the patient verbally acknowledge consent to telehealth visit? Document YES/NO here: YES   2. I advised the patient to be prepared - I asked that the patient, on the day of her visit, record any information possible with the equipment at her home, such as blood pressure, pulse, oxygen saturation, and your weight and write them all down. I asked the patient to have a pen and paper handy nearby the day of the visit as well.  3. If the patient was scheduled for a video visit, I informed the patient that the visit with the doctor would start with a text to the smartphone # given to [place] by the patient.         If the patient was scheduled for a telephone call, I informed the patient that the visit with the doctor would start with a call to the telephone # given to [place] by the patient.  4. I Informed patient they will receive a phone call 15 minutes prior to their appointment time from a [place] or nurse to review medications, allergies, etc. to prepare for the visit.    TELEPHONE CALL NOTE  Mary [name] has been deemed a candidate for a follow-up tele-health visit to limit community exposure during the Covid-19 pandemic. I spoke with the patient via phone to ensure availability of phone/video source, confirm preferred email & phone number, and discuss instructions and expectations.  I reminded Mary [name]  to be prepared with any vital sign and/or heart rhythm information that could potentially be obtained via home monitoring, at the time of her visit. I reminded Mary [name] to expect a phone call prior to her visit.  Cleaster Corin, NT 11/29/2018 10:53 AM     FULL LENGTH CONSENT FOR TELE-HEALTH VISIT   I hereby voluntarily request, consent and authorize [facility] and its employed or contracted physicians, physician assistants, nurse practitioners or other licensed health care professionals (the Practitioner), to provide me with telemedicine health care services (the "Services") as deemed necessary by the treating Practitioner. I acknowledge and consent to receive the  Services by the Practitioner via telemedicine. I understand that the telemedicine visit will involve communicating with the Practitioner through live audiovisual communication technology and the disclosure of certain medical information by electronic transmission. I acknowledge that I have been given the opportunity to request an in-person assessment or other available alternative prior to the telemedicine visit and am voluntarily participating in the telemedicine visit.  I understand that I have the right to withhold or withdraw my consent to the use of telemedicine in the course of my care at any time, without affecting my right to future care or treatment, and that the Practitioner or I may terminate the telemedicine visit at any time. I understand that I have the right to inspect all information obtained and/or recorded in the course of the telemedicine visit and may receive copies of available information for a reasonable fee.  I understand that some of the potential risks of receiving the Services via telemedicine include:  Marland Kitchen Delay or interruption in medical evaluation due to technological equipment failure or disruption; . Information transmitted may not be sufficient (e.g. poor resolution of images) to allow for appropriate medical decision making by the Practitioner; and/or  . In rare instances, security protocols could fail, causing a breach of personal health information.  Furthermore, I acknowledge that it is my responsibility to provide information about my medical history, conditions and care that is complete and accurate to the best of my ability. I acknowledge that Practitioner's advice, recommendations, and/or decision may be based on factors not within their control, such as incomplete or inaccurate data provided by me or distortions of diagnostic images or specimens that may result from electronic transmissions. I understand that the practice of medicine is not an exact science and that  Practitioner makes no warranties or guarantees regarding treatment outcomes. I acknowledge that I will receive a copy of this consent concurrently upon execution via email to the email address I last provided but may also request a printed copy by calling the office of [facility].    I understand that my insurance will be billed for this visit.   I have read or had this consent read to me. . I understand the contents of this consent, which adequately explains the benefits and risks of the Services being provided via telemedicine.  . I have been provided ample opportunity to ask questions regarding this consent and the Services and have had my questions answered to my satisfaction. . I give my informed consent for the services to be provided through the use of telemedicine in my medical care  By participating in this telemedicine visit I agree to the above.

## 2018-12-05 ENCOUNTER — Other Ambulatory Visit: Payer: Self-pay

## 2018-12-05 ENCOUNTER — Encounter: Payer: Self-pay | Admitting: Vascular Surgery

## 2018-12-05 ENCOUNTER — Ambulatory Visit (INDEPENDENT_AMBULATORY_CARE_PROVIDER_SITE_OTHER): Payer: Self-pay | Admitting: Vascular Surgery

## 2018-12-05 VITALS — BP 152/72 | HR 76 | Temp 97.7°F | Resp 18 | Wt 226.0 lb

## 2018-12-05 DIAGNOSIS — N184 Chronic kidney disease, stage 4 (severe): Secondary | ICD-10-CM

## 2018-12-05 NOTE — Progress Notes (Signed)
Virtual Visit via Telephone Note    I connected with Mary [name] on 12/05/2018 using the [email or web address] by telephone and verified that I was speaking with the correct person using two identifiers. Patient was located at skilled nursing facility and accompanied by Mary [name]. I am located at [employer].   The limitations of evaluation and management by telemedicine and the availability of in person appointments have been previously discussed with the patient and are documented in the patients chart. The patient expressed understanding and consented to proceed.  PCP: Pablo Ledger, MD  Chief Complaint: Follow-up AV fistula creation  History of Present Illness: Mary [name] is a 73 y.o. female with at the skilled nursing facility.  She has chronic renal insufficiency I discussed this with nursing supervisor and also with the patient.  She is followed by her nephrologist who does not feel she has any indication to proceed to hemodialysis.  Her weight is remained stable at 225 to 230 pounds.  Room air saturations are reported at 83%.  The patient denies any pain associated with her antecubital incision and no steal symptoms  Past Medical History:  Diagnosis Date  . Anemia   . Anxiety   . Arthritis    knees  . Cellulitis and abscess of foot 02/12/2012  . CHF (congestive heart failure) ([place])   . Complication of anesthesia    pt states after her hysterectomy the doctor said she was "wild" when she woke up   . Diabetes mellitus   . Diabetes mellitus with neuropathy 07/25/2011  . Dysrhythmia    tachy- takes Metoprolol  . Hypertension   . OSA (obstructive sleep apnea) 07/25/2011   not using CPAP  . Osteomyelitis of right foot ([place]) 02/14/2012  . Partial Achilles tendon tear 02/14/2012  . Pneumonia   . Restless leg syndrome 07/25/2011  . Restless leg syndrome   . Wound, open    right foot    Past Surgical History:  Procedure Laterality Date  . ABDOMINAL HYSTERECTOMY    .  ACHILLES TENDON REPAIR    . AMPUTATION Right 11/25/2012   Procedure: AMPUTATION BELOW KNEE;  Surgeon: Newt Minion, MD;  Location: [facility];  Service: Orthopedics;  Laterality: Right;  Right Below Knee Amputation  . AV FISTULA PLACEMENT Left 08/16/2018   Procedure: CREATION OF ARTERIOVENOUS FISTULA LEFT ARM;  Surgeon: Rosetta Posner, MD;  Location: [facility];  Service: Vascular;  Laterality: Left;  . BELOW KNEE LEG AMPUTATION Right 11/25/2012   Dr Sharol Given  . BREAST SURGERY Left    Lumpectomy non ca  . CATARACT EXTRACTION W/PHACO Right 10/09/2013   Procedure: CATARACT EXTRACTION PHACO AND INTRAOCULAR LENS PLACEMENT (IOC);  Surgeon: Elta Guadeloupe T. Gershon Crane, MD;  Location: [facility];  Service: Ophthalmology;  Laterality: Right;  CDE:9.05  . CATARACT EXTRACTION W/PHACO Left 10/23/2013   Procedure: ATTEMPTED CATARACT EXTRACTION PHACO AND INTRAOCULAR LENS PLACEMENT;  Surgeon: Elta Guadeloupe T. Gershon Crane, MD;  Location: [facility];  Service: Ophthalmology;  Laterality: Left;  CDE:  4.41  . TOE AMPUTATION     partial rt great toe    Current Meds  Medication Sig  . amLODipine (NORVASC) 10 MG tablet Take 10 mg by mouth daily.  . Baclofen 5 MG TABS Take 5 mg by mouth at bedtime as needed (muscle spasms).  . calcium acetate (PHOSLO) 667 MG capsule Take 2 capsules (1,334 mg total) by mouth 3 (three) times daily with meals. (Patient taking differently: Take 667 mg by mouth 3 (  three) times daily with meals. )  . diazepam (VALIUM) 2 MG tablet Take 1 tablet (2 mg total) by mouth 2 (two) times daily.  Marland Kitchen doxazosin (CARDURA) 4 MG tablet Take 4 mg by mouth at bedtime.   Marland Kitchen epoetin alfa-epbx (RETACRIT) 4000 UNIT/ML injection Inject 4,000 Units into the skin every 14 (fourteen) days.   . ferrous sulfate 325 (65 FE) MG tablet Take 325 mg by mouth daily.   . insulin detemir (LEVEMIR) 100 UNIT/ML injection Inject 0.15 mLs (15 Units total) into the skin at bedtime.  . insulin NPH-regular Human (70-30) 100 UNIT/ML injection Inject 28 Units into the skin  daily.  . magnesium oxide (MAG-OX) 400 MG tablet Take 400 mg by mouth daily.  . metolazone (ZAROXOLYN) 2.5 MG tablet Take 1 tablet daily PRN weight gain more than 3 pounds in 24 hours (Patient taking differently: Take 2.5 mg by mouth daily as needed (>3 lb weight gain in 24 hours). )  . metoprolol tartrate (LOPRESSOR) 100 MG tablet Take 100 mg by mouth 2 (two) times daily.  . Multiple Vitamin (MULTIVITAMIN) tablet Take 1 tablet by mouth daily.  Marland Kitchen oxyCODONE-acetaminophen (PERCOCET) 7.5-325 MG tablet Take 1 tablet by mouth every 8 (eight) hours as needed for severe pain.  . polyethylene glycol (MIRALAX / GLYCOLAX) packet Take 17 g by mouth daily as needed for mild constipation. (Patient taking differently: Take 17 g by mouth daily. )  . promethazine (PHENERGAN) 25 MG suppository Place 25 mg rectally every 4 (four) hours as needed for nausea or vomiting.  Marland Kitchen rOPINIRole (REQUIP) 2 MG tablet Take 4 mg by mouth at bedtime.   . sertraline (ZOLOFT) 50 MG tablet Take 50 mg by mouth daily.  Marland Kitchen tiZANidine (ZANAFLEX) 4 MG tablet Take 4 mg by mouth 3 (three) times daily.  . Vitamin D, Ergocalciferol, (DRISDOL) 1.25 MG (50000 UT) CAPS capsule Take 50,000 Units by mouth every Monday.    12 system ROS was negative unless otherwise noted in HPI      Assessment and Plan:  Stable status post AV fistula creation.  Is now 3 months out and should be able to use this.  Explained that if she has difficulty due to tortuosity, depth of fistula or other issues may require revision. Follow Up Instructions:   Follow up prn   I discussed the assessment and treatment plan with the patient. The patient was provided an opportunity to ask questions and all were answered. The patient agreed with the plan and demonstrated an understanding of the instructions.   The patient was advised to call back or seek an in-person evaluation if the symptoms worsen or if the condition fails to improve as anticipated.  I spent 5-10  minutes with the patient via telephone encounter.   Annamary Rummage [facility] [place] Office: [phone]  12/05/2018, 10:44 AM

## 2019-07-10 ENCOUNTER — Encounter: Payer: Self-pay | Admitting: Student

## 2019-07-10 ENCOUNTER — Telehealth (INDEPENDENT_AMBULATORY_CARE_PROVIDER_SITE_OTHER): Payer: Medicare Other | Admitting: Student

## 2019-07-10 VITALS — BP 130/61 | HR 76 | Temp 97.1°F | Wt 235.0 lb

## 2019-07-10 DIAGNOSIS — N184 Chronic kidney disease, stage 4 (severe): Secondary | ICD-10-CM

## 2019-07-10 DIAGNOSIS — I10 Essential (primary) hypertension: Secondary | ICD-10-CM

## 2019-07-10 DIAGNOSIS — I471 Supraventricular tachycardia: Secondary | ICD-10-CM

## 2019-07-10 DIAGNOSIS — E782 Mixed hyperlipidemia: Secondary | ICD-10-CM

## 2019-07-10 DIAGNOSIS — I5032 Chronic diastolic (congestive) heart failure: Secondary | ICD-10-CM | POA: Diagnosis not present

## 2019-07-10 NOTE — Progress Notes (Addendum)
Virtual Visit via Telephone Note   This visit type was conducted due to national recommendations for restrictions regarding the COVID-19 Pandemic (e.g. social distancing) in an effort to limit this patient's exposure and mitigate transmission in our community.  Due to her co-morbid illnesses, this patient is at least at moderate risk for complications without adequate follow up.  This format is felt to be most appropriate for this patient at this time.  The patient did not have access to video technology/had technical difficulties with video requiring transitioning to audio format only (telephone).  All issues noted in this document were discussed and addressed.  No physical exam could be performed with this format.  Please refer to the patient's chart for her  consent to telehealth for [facility].   Date:  07/10/2019   ID:  Mary [name], DOB [birth year], MRN [id number]  Patient Location: [place] Provider Location: Office  PCP:  Pablo Ledger, MD  Cardiologist:  Carlyle Dolly, MD  Electrophysiologist:  None   Evaluation Performed:  Follow-Up Visit  Chief Complaint:  Overdue Visit  History of Present Illness:    Mary [name] is a 74 y.o. female with past medical history of nonischemic cardiomyopathy (EF 30-35% by echo in 2018, normalized by repeat imaging), pSVT, HTN, HLD, OSA and Stage 5 CKD who presents for an overdue follow-up telehealth visit.   She was last examined by Dr. Harl Bowie in 05/2017 and denied any recent palpitations or dyspnea on exertion at that time. BP was elevated and Hydralazine was increased to 37.5mg  TID. Was informed to follow-up in 6 months but she has not been evaluated by Cardiology since.   By review of notes, she was admitted to [facility] in 07/2018 for an acute CHF exacerbation. She was followed by Nephrology during admission and transitioned to Lasix 160mg  BID at discharge with plans to follow-up with Vascular Surgery as an  outpatient for HD access placement. She did undergo AV fistula creation by Dr. Donnetta Hutching in 07/2018.  In talking with the patient today, she reports overall doing well from a cardiac perspective since her last visit. She denies any recurrent palpitations. She is mostly in a wheelchair throughout the day but does transfer to sit in her chair or to use the restroom. She denies any chest pain or dyspnea on exertion with these activities. No recent orthopnea, PND or lower extremity edema. She reports good urine output with her current diuretic regimen and has not yet had to start HD.   She has received her second dose of the Covid vaccination reports feeling shaky and having decreased oxygen saturations after her second dose but says this resolved within a few days. She now feels back to baseline.  The patient does not have symptoms concerning for COVID-19 infection (fever, chills, cough, or new shortness of breath).    Past Medical History:  Diagnosis Date  . Anemia   . Anxiety   . Arthritis    knees  . Cellulitis and abscess of foot 02/12/2012  . CHF (congestive heart failure) ([place])   . Complication of anesthesia    pt states after her hysterectomy the doctor said she was "wild" when she woke up   . Diabetes mellitus   . Diabetes mellitus with neuropathy 07/25/2011  . Dysrhythmia    tachy- takes Metoprolol  . Hypertension   . OSA (obstructive sleep apnea) 07/25/2011   not using CPAP  . Osteomyelitis of right foot ([place]) 02/14/2012  . Partial  Achilles tendon tear 02/14/2012  . Pneumonia   . Restless leg syndrome 07/25/2011  . Restless leg syndrome   . Wound, open    right foot   Past Surgical History:  Procedure Laterality Date  . ABDOMINAL HYSTERECTOMY    . ACHILLES TENDON REPAIR    . AMPUTATION Right 11/25/2012   Procedure: AMPUTATION BELOW KNEE;  Surgeon: Newt Minion, MD;  Location: [place];  Service: Orthopedics;  Laterality: Right;  Right Below Knee Amputation  . AV FISTULA PLACEMENT  Left 08/16/2018   Procedure: CREATION OF ARTERIOVENOUS FISTULA LEFT ARM;  Surgeon: Rosetta Posner, MD;  Location: [place];  Service: Vascular;  Laterality: Left;  . BELOW KNEE LEG AMPUTATION Right 11/25/2012   Dr Sharol Given  . BREAST SURGERY Left    Lumpectomy non ca  . CATARACT EXTRACTION W/PHACO Right 10/09/2013   Procedure: CATARACT EXTRACTION PHACO AND INTRAOCULAR LENS PLACEMENT (IOC);  Surgeon: Elta Guadeloupe T. Gershon Crane, MD;  [facility];  Service: Ophthalmology;  Laterality: Right;  CDE:9.05  . CATARACT EXTRACTION W/PHACO Left 10/23/2013   Procedure: ATTEMPTED CATARACT EXTRACTION PHACO AND INTRAOCULAR LENS PLACEMENT;  Surgeon: Elta Guadeloupe T. Gershon Crane, MD;  [facility]: [facility];  Service: Ophthalmology;  Laterality: Left;  CDE:  4.41  . TOE AMPUTATION     partial rt great toe     Current Meds  Medication Sig  . amLODipine (NORVASC) 10 MG tablet Take 10 mg by mouth daily.  . ARNUITY ELLIPTA 100 MCG/ACT AEPB   . calcium acetate (PHOSLO) 667 MG capsule Take 2 capsules (1,334 mg total) by mouth 3 (three) times daily with meals. (Patient taking differently: Take 667 mg by mouth 3 (three) times daily with meals. )  . diazepam (VALIUM) 2 MG tablet Take 1 tablet (2 mg total) by mouth 2 (two) times daily.  Marland Kitchen doxazosin (CARDURA) 4 MG tablet Take 4 mg by mouth at bedtime.   . Dulaglutide (TRULICITY) A999333 0000000 SOPN Inject 0.75 mg into the skin every Friday.  . DULoxetine (CYMBALTA) 60 MG capsule Take 60 mg by mouth daily.  Marland Kitchen epoetin alfa-epbx (RETACRIT) 4000 UNIT/ML injection Inject 4,000 Units into the skin every 14 (fourteen) days.   . ferrous sulfate 325 (65 FE) MG tablet Take 325 mg by mouth daily.   Marland Kitchen gabapentin (NEURONTIN) 300 MG capsule Take 300 mg by mouth 3 (three) times daily.  . insulin NPH-regular Human (70-30) 100 UNIT/ML injection Inject 28 Units into the skin daily.  Marland Kitchen LANTUS SOLOSTAR 100 UNIT/ML Solostar Pen 10 Units daily.   Marland Kitchen levothyroxine (SYNTHROID) 25 MCG tablet Take 25 mcg by mouth daily before  breakfast.  . magnesium oxide (MAG-OX) 400 MG tablet Take 400 mg by mouth daily.  . methocarbamol (ROBAXIN) 500 MG tablet Take 1,500 mg by mouth 4 (four) times daily.   . metolazone (ZAROXOLYN) 2.5 MG tablet Take 1 tablet daily PRN weight gain more than 3 pounds in 24 hours (Patient taking differently: Take 2.5 mg by mouth every Monday, Wednesday, and Friday. Taking 5mg  on MWF)  . metoprolol tartrate (LOPRESSOR) 25 MG tablet Take 25 mg by mouth 2 (two) times daily.   . Multiple Vitamin (MULTIVITAMIN) tablet Take 1 tablet by mouth daily.  Marland Kitchen omeprazole (PRILOSEC) 40 MG capsule Take 40 mg by mouth daily.  Marland Kitchen oxyCODONE-acetaminophen (PERCOCET) 7.5-325 MG tablet Take 1 tablet by mouth every 8 (eight) hours as needed for severe pain.  . polyethylene glycol (MIRALAX / GLYCOLAX) packet Take 17 g by mouth daily as needed for mild  constipation. (Patient taking differently: Take 17 g by mouth daily. )  . pravastatin (PRAVACHOL) 10 MG tablet Take 10 mg by mouth daily.  . promethazine (PHENERGAN) 25 MG suppository Place 25 mg rectally every 4 (four) hours as needed for nausea or vomiting.  Marland Kitchen rOPINIRole (REQUIP) 2 MG tablet Take 4 mg by mouth at bedtime.   . sodium polystyrene (KAYEXALATE) powder Take 30 g by mouth once. Taking every Wednesday  . torsemide (DEMADEX) 100 MG tablet Take 100 mg by mouth daily.  . Vitamin D, Ergocalciferol, (DRISDOL) 1.25 MG (50000 UT) CAPS capsule Take 50,000 Units by mouth every Monday.     Allergies:   Morphine   Social History   Tobacco Use  . Smoking status: Never Smoker  . Smokeless tobacco: Never Used  Substance Use Topics  . Alcohol use: No  . Drug use: No     Family Hx: The patient's family history includes Diabetes in her sister.  ROS:   Please see the history of present illness.     All other systems reviewed and are negative.   Prior CV studies:   The following studies were reviewed today:  Echocardiogram: 07/2018 IMPRESSIONS    1. The left  ventricle has normal systolic function with an ejection  fraction of 60-65%. The cavity size was normal. There is mildly increased  left ventricular wall thickness. abnormal, indeterminant grade Elevated  mean left atrial pressure.  2. The right ventricle has normal systolic function. The cavity was  normal. There is no increase in right ventricular wall thickness.  3. Left atrial size was moderately dilated.  4. The aortic valve is tricuspid Mild thickening of the aortic valve Mild  calcification of the aortic valve. no stenosis of the aortic valve.  Moderate aortic annular calcification noted.  5. The mitral valve is normal in structure. Mild thickening of the mitral  valve leaflet. Mild calcification of the mitral valve leaflet. There is  mild mitral annular calcification present. No evidence of mitral valve  stenosis.  6. The tricuspid valve is normal in structure.  7. The aortic root is normal in size and structure.   Labs/Other Tests and Data Reviewed:    EKG:  An ECG dated 07/31/2018 was personally reviewed today and demonstrated:  NSR, HR 62 with nonspecific IVCD and no acute ST changes.   Recent Labs: 07/31/2018: B Natriuretic Peptide 381.0; Magnesium 2.4 08/01/2018: Platelets 169; TSH 6.361 08/03/2018: BUN 91; Creatinine, Ser 3.71 08/16/2018: Hemoglobin 9.5; Potassium 5.2; Sodium 136   Recent Lipid Panel Lab Results  Component Value Date/Time   CHOL 149 01/14/2014 05:48 AM   TRIG 148 01/14/2014 05:48 AM   HDL 50 01/14/2014 05:48 AM   CHOLHDL 3.0 01/14/2014 05:48 AM   LDLCALC 69 01/14/2014 05:48 AM    Wt Readings from Last 3 Encounters:  07/10/19 235 lb (106.6 kg)  12/05/18 226 lb (102.5 kg)  08/16/18 234 lb 9.1 oz (106.4 kg)     Objective:    Vital Signs:  BP 130/61   Pulse 76   Temp (!) 97.1 F (36.2 C)   Wt 235 lb (106.6 kg)   LMP 07/25/2011   SpO2 97%   BMI 33.72 kg/m    General: Pleasant female sounding in NAD Psych: Normal affect. Neuro: Alert and  oriented X 3.  Lungs:  Resp regular and unlabored while talking on the phone.   ASSESSMENT & PLAN:    1. Chronic Diastolic CHF - EF previously 30 to 35% in  2018 but normalized by repeat imaging. Echocardiogram in 07/2018 showed a preserved EF of 60 to 65%. - She denies any recent orthopnea, PND or lower extremity edema. Diuretic therapy is managed by Nephrology in the setting of Stage 4-5 CKD. She remains on Torsemide 100mg  daily and Metolazone 5mg  MWF.   2. pSVT - she denies any recent palpitations. Heart rate well controlled in the 70's on most recent check. Continue Lopressor 25 mg twice daily.  3. HTN - BP well controlled at 130/61 on most recent check. Continue current medication regimen.   4. HLD - followed by PCP. She remains on Pravastatin 10mg  daily.   5. Stage 4-5 CKD -  Creatinine 3.71 in 07/2018. Followed by Dr. Theador Hawthorne.   COVID-19 Education: The signs and symptoms of COVID-19 were discussed with the patient and how to seek care for testing (follow up with PCP or arrange E-visit).  The importance of social distancing was discussed today.  Time:   Today, I have spent 14 minutes with the patient with telehealth technology discussing the above problems.     Medication Adjustments/Labs and Tests Ordered: Current medicines are reviewed at length with the patient today.  Concerns regarding medicines are outlined above.   Tests Ordered: No orders of the defined types were placed in this encounter.   Medication Changes: No orders of the defined types were placed in this encounter.   Follow Up:  Virtual Visit  in 6 month(s)  Signed, Erma Heritage, PA-C  07/10/2019 4:49 PM    [facility]

## 2019-07-10 NOTE — Patient Instructions (Signed)
Medication Instructions:  Your physician recommends that you continue on your current medications as directed. Please refer to the Current Medication list given to you today.  *If you need a refill on your cardiac medications before your next appointment, please call your pharmacy*  Lab Work: NONE If you have labs (blood work) drawn today and your tests are completely normal, you will receive your results only by: Marland Kitchen MyChart Message (if you have MyChart) OR . A paper copy in the mail If you have any lab test that is abnormal or we need to change your treatment, we will call you to review the results.  Testing/Procedures: NONE   Follow-Up: At [facility], you and your health needs are our priority.  As part of our continuing mission to provide you with exceptional heart care, we have created designated Provider Care Teams.  These Care Teams include your primary Cardiologist (physician) and Advanced Practice Providers (APPs -  Physician Assistants and Nurse Practitioners) who all work together to provide you with the care you need, when you need it.  Your next appointment:   6 month(s)  The format for your next appointment:   Virtual Visit   Provider:   Carlyle Dolly, MD  Other Instructions Thank you for choosing [facility]!

## 2019-09-06 ENCOUNTER — Other Ambulatory Visit: Payer: Self-pay | Admitting: *Deleted

## 2019-09-06 DIAGNOSIS — N184 Chronic kidney disease, stage 4 (severe): Secondary | ICD-10-CM

## 2019-09-07 ENCOUNTER — Ambulatory Visit (INDEPENDENT_AMBULATORY_CARE_PROVIDER_SITE_OTHER): Payer: Medicare Other | Admitting: Physician Assistant

## 2019-09-07 ENCOUNTER — Other Ambulatory Visit: Payer: Self-pay

## 2019-09-07 ENCOUNTER — Ambulatory Visit (HOSPITAL_COMMUNITY)
Admission: RE | Admit: 2019-09-07 | Discharge: 2019-09-07 | Disposition: A | Discharge status: Home / Self Care | Payer: Medicare Other | Source: Ambulatory Visit | Attending: Vascular Surgery | Admitting: Vascular Surgery

## 2019-09-07 VITALS — BP 114/68 | HR 68 | Resp 18

## 2019-09-07 DIAGNOSIS — N184 Chronic kidney disease, stage 4 (severe): Secondary | ICD-10-CM

## 2019-09-07 NOTE — Progress Notes (Signed)
[facility] [place] HISTORY AND PHYSICAL   History of Present Illness:  Patient is a 74 y.o. year old female who presents with problems with her fistula.  The fistula was created on 08/16/2018.  She is not on HD at this time.  She denise pain, loss of sensation or motor in the left UE.  She denise edema and SOB.      Past Medical History:  Diagnosis Date  . Anemia   . Anxiety   . Arthritis    knees  . Cellulitis and abscess of foot 02/12/2012  . CHF (congestive heart failure) ([place])   . Complication of anesthesia    pt states after her hysterectomy the doctor said she was "wild" when she woke up   . Diabetes mellitus   . Diabetes mellitus with neuropathy 07/25/2011  . Dysrhythmia    tachy- takes Metoprolol  . Hypertension   . OSA (obstructive sleep apnea) 07/25/2011   not using CPAP  . Osteomyelitis of right foot ([place]) 02/14/2012  . Partial Achilles tendon tear 02/14/2012  . Pneumonia   . Restless leg syndrome 07/25/2011  . Restless leg syndrome   . Wound, open    right foot    Past Surgical History:  Procedure Laterality Date  . ABDOMINAL HYSTERECTOMY    . ACHILLES TENDON REPAIR    . AMPUTATION Right 11/25/2012   Procedure: AMPUTATION BELOW KNEE;  Surgeon: Newt Minion, MD;  Location: [place];  Service: Orthopedics;  Laterality: Right;  Right Below Knee Amputation  . AV FISTULA PLACEMENT Left 08/16/2018   Procedure: CREATION OF ARTERIOVENOUS FISTULA LEFT ARM;  Surgeon: Rosetta Posner, MD;  Location: [place];  Service: Vascular;  Laterality: Left;  . BELOW KNEE LEG AMPUTATION Right 11/25/2012   Dr Sharol Given  . BREAST SURGERY Left    Lumpectomy non ca  . CATARACT EXTRACTION W/PHACO Right 10/09/2013   Procedure: CATARACT EXTRACTION PHACO AND INTRAOCULAR LENS PLACEMENT (IOC);  Surgeon: Elta Guadeloupe T. Gershon Crane, MD;  [facility];  Service: Ophthalmology;  Laterality: Right;  CDE:9.05  . CATARACT EXTRACTION W/PHACO Left 10/23/2013   Procedure: ATTEMPTED CATARACT EXTRACTION PHACO  AND INTRAOCULAR LENS PLACEMENT;  Surgeon: Elta Guadeloupe T. Gershon Crane, MD;  [facility]: [facility];  Service: Ophthalmology;  Laterality: Left;  CDE:  4.41  . TOE AMPUTATION     partial rt great toe     Social History Social History   Tobacco Use  . Smoking status: Never Smoker  . Smokeless tobacco: Never Used  Substance Use Topics  . Alcohol use: No  . Drug use: No    Family History Family History  Problem Relation Age of Onset  . Diabetes Sister     Allergies  Allergies  Allergen Reactions  . Morphine     UNSPECIFIED REACTION      Current Outpatient Medications  Medication Sig Dispense Refill  . amLODipine (NORVASC) 10 MG tablet Take 10 mg by mouth daily.    . ARNUITY ELLIPTA 100 MCG/ACT AEPB     . Baclofen 5 MG TABS Take 5 mg by mouth at bedtime as needed (muscle spasms).    . calcium acetate (PHOSLO) 667 MG capsule Take 2 capsules (1,334 mg total) by mouth 3 (three) times daily with meals. (Patient taking differently: Take 667 mg by mouth 3 (three) times daily with meals. )    . diazepam (VALIUM) 2 MG tablet Take 1 tablet (2 mg total) by mouth 2 (two) times daily. 12 tablet 0  .  doxazosin (CARDURA) 4 MG tablet Take 4 mg by mouth at bedtime.     . Dulaglutide (TRULICITY) 8.11 XB/2.6OM SOPN Inject 0.75 mg into the skin every Friday.    . DULoxetine (CYMBALTA) 60 MG capsule Take 60 mg by mouth daily.    Marland Kitchen epoetin alfa-epbx (RETACRIT) 4000 UNIT/ML injection Inject 4,000 Units into the skin every 14 (fourteen) days.     . ferrous sulfate 325 (65 FE) MG tablet Take 325 mg by mouth daily.     Marland Kitchen gabapentin (NEURONTIN) 300 MG capsule Take 300 mg by mouth 3 (three) times daily.    . insulin detemir (LEVEMIR) 100 UNIT/ML injection Inject 0.15 mLs (15 Units total) into the skin at bedtime. 10 mL 11  . insulin NPH-regular Human (70-30) 100 UNIT/ML injection Inject 28 Units into the skin daily.    Marland Kitchen LANTUS SOLOSTAR 100 UNIT/ML Solostar Pen 10 Units daily.     Marland Kitchen levothyroxine (SYNTHROID) 25 MCG  tablet Take 25 mcg by mouth daily before breakfast.    . magnesium oxide (MAG-OX) 400 MG tablet Take 400 mg by mouth daily.    . methocarbamol (ROBAXIN) 500 MG tablet Take 1,500 mg by mouth 4 (four) times daily.     . metolazone (ZAROXOLYN) 2.5 MG tablet Take 1 tablet daily PRN weight gain more than 3 pounds in 24 hours (Patient taking differently: Take 2.5 mg by mouth every Monday, Wednesday, and Friday. Taking 5mg  on MWF)    . metoprolol tartrate (LOPRESSOR) 25 MG tablet Take 25 mg by mouth 2 (two) times daily.     . Multiple Vitamin (MULTIVITAMIN) tablet Take 1 tablet by mouth daily.    Marland Kitchen omeprazole (PRILOSEC) 40 MG capsule Take 40 mg by mouth daily.    Marland Kitchen oxyCODONE-acetaminophen (PERCOCET) 7.5-325 MG tablet Take 1 tablet by mouth every 8 (eight) hours as needed for severe pain. 6 tablet 0  . polyethylene glycol (MIRALAX / GLYCOLAX) packet Take 17 g by mouth daily as needed for mild constipation. (Patient taking differently: Take 17 g by mouth daily. ) 14 each 0  . pravastatin (PRAVACHOL) 10 MG tablet Take 10 mg by mouth daily.    . promethazine (PHENERGAN) 25 MG suppository Place 25 mg rectally every 4 (four) hours as needed for nausea or vomiting.    Marland Kitchen rOPINIRole (REQUIP) 2 MG tablet Take 4 mg by mouth at bedtime.     . sertraline (ZOLOFT) 50 MG tablet Take 50 mg by mouth daily.    . sodium polystyrene (KAYEXALATE) powder Take 30 g by mouth once. Taking every Wednesday    . tiZANidine (ZANAFLEX) 4 MG tablet Take 4 mg by mouth 3 (three) times daily.    Marland Kitchen torsemide (DEMADEX) 100 MG tablet Take 100 mg by mouth daily.    . Vitamin D, Ergocalciferol, (DRISDOL) 1.25 MG (50000 UT) CAPS capsule Take 50,000 Units by mouth every Monday.     No current facility-administered medications for this visit.    ROS:   General:  No weight loss, Fever, chills  HEENT: No recent headaches, no nasal bleeding, no visual changes, no sore throat  Neurologic: No dizziness, blackouts, seizures. No recent symptoms  of stroke or mini- stroke. No recent episodes of slurred speech, or temporary blindness.  Cardiac: No recent episodes of chest pain/pressure, no shortness of breath at rest.  No shortness of breath with exertion.  Denies history of atrial fibrillation or irregular heartbeat  Vascular: No history of rest pain in feet.  No history of  claudication.  No history of non-healing ulcer, No history of DVT   Pulmonary: No home oxygen, no productive cough, no hemoptysis,  No asthma or wheezing  Musculoskeletal:  [ ]  Arthritis, [ ]  Low back pain,  [ ]  Joint pain  Hematologic:No history of hypercoagulable state.  No history of easy bleeding.  + history of anemia  Gastrointestinal: No hematochezia or melena,  No gastroesophageal reflux, no trouble swallowing  Urinary: [ x] chronic Kidney disease, [ ]  on HD - [ ]  MWF or [ ]  TTHS, [ ]  Burning with urination, [ ]  Frequent urination, [ ]  Difficulty urinating;   Skin: No rashes  Psychological: No history of anxiety,  No history of depression   Physical Examination  Vitals:   09/07/19 0928  BP: 114/68  Pulse: 68  Resp: 18  SpO2: 96%    There is no height or weight on file to calculate BMI.  General:  Alert and oriented, no acute distress HEENT: Normal Neck: No bruit or JVD Pulmonary: Clear to auscultation bilaterally Cardiac: Regular Rate and Rhythm without murmur Gastrointestinal: Soft, non-tender, non-distended, no mass, no scars Skin: No rash Extremity Pulses:  2+ radial, brachial pulses bilaterally.  Palpable anastomosis thrill/pulse. Musculoskeletal: No deformity or edema  Neurologic: Upper and lower extremity motor 5/5 and symmetric  DATA:  Findings:  +--------------------+----------+-----------------+--------+  AVF         PSV (cm/s)Flow Vol (mL/min)Comments  +--------------------+----------+-----------------+--------+  Native artery inflow  129      312           +--------------------+----------+-----------------+--------+  AVF Anastomosis     211                 +--------------------+----------+-----------------+--------+     +------------+----------+-------------+----------+-------------------------  ----+  OUTFLOW VEINPSV (cm/s)Diameter (cm)Depth (cm)     Describe        +------------+----------+-------------+----------+-------------------------  ----+  Prox UA     30    0.53     0.42  tortuous and competing  branch  +------------+----------+-------------+----------+-------------------------  ----+  Mid UA     103    0.57     0.24  tortuous and competing  branch  +------------+----------+-------------+----------+-------------------------  ----+  Dist UA     467    0.21     0.21    thrombus and  stenotic    +------------+----------+-------------+----------+-------------------------  ----+  AC Fossa    74    0.96     0.13     fibrous stranding      +------------+----------+-------------+----------+-------------------------  ----+        Summary:  Arteriovenous fistula-Elevated velocities and a velocity ratio of greater  than 3  noted in the distal upper arm.  Arteriovenous fistula-Velocities less than 100cm/s noted in the the [facility]  fossa and  proximal upper arm.  Arteriovenous fistula-Thrombus noted in the distal upper arm and AC fossa.  Arteriovenous fistula demonstrates abnormal pulsatile inflow artey  waveforms.   ASSESSMENT:  Poorly developed left BC AV fistula with noted areas of stenosis on duplex. She denise SOB and edema.  She is not on HD right now and has reported about 15% renal function.     PLAN: We will plan TDC placement and fistulogram of the left AV fistula with possible intervention when she needs HD in the future.  We don't want to perform a fistulogram now secondary to the contrast  dye pushing her into early renal failure.    If the left AV fistula can not be salvaged,  we can creat new access when the time comes.  F/U PRN.  Roxy Horseman PA-C [facility] [place] Office: [phone]  MD in clinic [place]

## 2020-01-07 ENCOUNTER — Ambulatory Visit (INDEPENDENT_AMBULATORY_CARE_PROVIDER_SITE_OTHER): Payer: Medicare Other | Admitting: Cardiology

## 2020-01-07 ENCOUNTER — Encounter: Payer: Self-pay | Admitting: Cardiology

## 2020-01-07 VITALS — BP 120/60 | HR 75 | Ht 70.0 in | Wt 226.0 lb

## 2020-01-07 DIAGNOSIS — I471 Supraventricular tachycardia: Secondary | ICD-10-CM | POA: Diagnosis not present

## 2020-01-07 DIAGNOSIS — I5032 Chronic diastolic (congestive) heart failure: Secondary | ICD-10-CM | POA: Diagnosis not present

## 2020-01-07 DIAGNOSIS — I10 Essential (primary) hypertension: Secondary | ICD-10-CM | POA: Diagnosis not present

## 2020-01-07 NOTE — Patient Instructions (Signed)

## 2020-01-07 NOTE — Progress Notes (Signed)
Clinical Summary Ms. Nebergall is a 74 y.o.female seen today for follow up of the following medical problems.   1.PSVT - compliant with beta blocker  - no recent palpitations.   2. Chronicsystolic/diastolic heart failure -admit 11/2016 with severe CHF, diuresed over 17 liters. Echo at that time showed a drop in LVEF to 30-35%, however on more recent repeat her LVEF has now normalized.   - no recent edema. No SOB/DOE  - diuretics managed by renal  3. HTN - compliant with meds   4. Hyperlipidemia - compliant with statin   5. Pericardial effusion -chronic, noted to be small by last echo 11/2016  6. OSA -has not been using home CPAP - not comfortable wearing cpap   7. CKDIV - most recently Cr had been trending down - followed by Dr Theador Hawthorne   8. COVID + - mild case 1 year - has been vaccinated  Past Medical History:  Diagnosis Date  . Anemia   . Anxiety   . Arthritis    knees  . Cellulitis and abscess of foot 02/12/2012  . CHF (congestive heart failure) ([place])   . Complication of anesthesia    pt states after her hysterectomy the doctor said she was "wild" when she woke up   . Diabetes mellitus   . Diabetes mellitus with neuropathy 07/25/2011  . Dysrhythmia    tachy- takes Metoprolol  . Hypertension   . OSA (obstructive sleep apnea) 07/25/2011   not using CPAP  . Osteomyelitis of right foot ([place]) 02/14/2012  . Partial Achilles tendon tear 02/14/2012  . Pneumonia   . Restless leg syndrome 07/25/2011  . Restless leg syndrome   . Wound, open    right foot     Allergies  Allergen Reactions  . Morphine     UNSPECIFIED REACTION      Current Outpatient Medications  Medication Sig Dispense Refill  . amLODipine (NORVASC) 10 MG tablet Take 10 mg by mouth daily.    . ARNUITY ELLIPTA 100 MCG/ACT AEPB     . Baclofen 5 MG TABS Take 5 mg by mouth at bedtime as needed (muscle spasms).    . calcium acetate (PHOSLO) 667 MG capsule Take 2 capsules  (1,334 mg total) by mouth 3 (three) times daily with meals. (Patient taking differently: Take 667 mg by mouth 3 (three) times daily with meals. )    . diazepam (VALIUM) 2 MG tablet Take 1 tablet (2 mg total) by mouth 2 (two) times daily. 12 tablet 0  . doxazosin (CARDURA) 4 MG tablet Take 4 mg by mouth at bedtime.     . Dulaglutide (TRULICITY) 3.29 JM/4.2AS SOPN Inject 0.75 mg into the skin every Friday.    . DULoxetine (CYMBALTA) 60 MG capsule Take 60 mg by mouth daily.    Marland Kitchen epoetin alfa-epbx (RETACRIT) 4000 UNIT/ML injection Inject 4,000 Units into the skin every 14 (fourteen) days.     . ferrous sulfate 325 (65 FE) MG tablet Take 325 mg by mouth daily.     Marland Kitchen gabapentin (NEURONTIN) 300 MG capsule Take 300 mg by mouth 3 (three) times daily.    . insulin detemir (LEVEMIR) 100 UNIT/ML injection Inject 0.15 mLs (15 Units total) into the skin at bedtime. 10 mL 11  . insulin NPH-regular Human (70-30) 100 UNIT/ML injection Inject 28 Units into the skin daily.    Marland Kitchen LANTUS SOLOSTAR 100 UNIT/ML Solostar Pen 10 Units daily.     Marland Kitchen levothyroxine (SYNTHROID) 25 MCG tablet  Take 25 mcg by mouth daily before breakfast.    . magnesium oxide (MAG-OX) 400 MG tablet Take 400 mg by mouth daily.    . methocarbamol (ROBAXIN) 500 MG tablet Take 1,500 mg by mouth 4 (four) times daily.     . metolazone (ZAROXOLYN) 2.5 MG tablet Take 1 tablet daily PRN weight gain more than 3 pounds in 24 hours (Patient taking differently: Take 2.5 mg by mouth every Monday, Wednesday, and Friday. Taking 5mg  on MWF)    . metoprolol tartrate (LOPRESSOR) 25 MG tablet Take 25 mg by mouth 2 (two) times daily.     . Multiple Vitamin (MULTIVITAMIN) tablet Take 1 tablet by mouth daily.    Marland Kitchen omeprazole (PRILOSEC) 40 MG capsule Take 40 mg by mouth daily.    Marland Kitchen oxyCODONE-acetaminophen (PERCOCET) 7.5-325 MG tablet Take 1 tablet by mouth every 8 (eight) hours as needed for severe pain. 6 tablet 0  . polyethylene glycol (MIRALAX / GLYCOLAX) packet Take  17 g by mouth daily as needed for mild constipation. (Patient taking differently: Take 17 g by mouth daily. ) 14 each 0  . pravastatin (PRAVACHOL) 10 MG tablet Take 10 mg by mouth daily.    . promethazine (PHENERGAN) 25 MG suppository Place 25 mg rectally every 4 (four) hours as needed for nausea or vomiting.    Marland Kitchen rOPINIRole (REQUIP) 2 MG tablet Take 4 mg by mouth at bedtime.     . sertraline (ZOLOFT) 50 MG tablet Take 50 mg by mouth daily.    . sodium polystyrene (KAYEXALATE) powder Take 30 g by mouth once. Taking every Wednesday    . tiZANidine (ZANAFLEX) 4 MG tablet Take 4 mg by mouth 3 (three) times daily.    Marland Kitchen torsemide (DEMADEX) 100 MG tablet Take 100 mg by mouth daily.    . Vitamin D, Ergocalciferol, (DRISDOL) 1.25 MG (50000 UT) CAPS capsule Take 50,000 Units by mouth every Monday.     No current facility-administered medications for this visit.     Past Surgical History:  Procedure Laterality Date  . ABDOMINAL HYSTERECTOMY    . ACHILLES TENDON REPAIR    . AMPUTATION Right 11/25/2012   Procedure: AMPUTATION BELOW KNEE;  Surgeon: Newt Minion, MD;  Location: [place];  Service: Orthopedics;  Laterality: Right;  Right Below Knee Amputation  . AV FISTULA PLACEMENT Left 08/16/2018   Procedure: CREATION OF ARTERIOVENOUS FISTULA LEFT ARM;  Surgeon: Rosetta Posner, MD;  Location: [place];  Service: Vascular;  Laterality: Left;  . BELOW KNEE LEG AMPUTATION Right 11/25/2012   Dr Sharol Given  . BREAST SURGERY Left    Lumpectomy non ca  . CATARACT EXTRACTION W/PHACO Right 10/09/2013   Procedure: CATARACT EXTRACTION PHACO AND INTRAOCULAR LENS PLACEMENT (IOC);  Surgeon: Elta Guadeloupe T. Gershon Crane, MD;  Location: [facility];  Service: Ophthalmology;  Laterality: Right;  CDE:9.05  . CATARACT EXTRACTION W/PHACO Left 10/23/2013   Procedure: ATTEMPTED CATARACT EXTRACTION PHACO AND INTRAOCULAR LENS PLACEMENT;  Surgeon: Elta Guadeloupe T. Gershon Crane, MD;  Location: [facility];  Service: Ophthalmology;  Laterality: Left;  CDE:  4.41  . TOE  AMPUTATION     partial rt great toe     Allergies  Allergen Reactions  . Morphine     UNSPECIFIED REACTION       Family History  Problem Relation Age of Onset  . Diabetes Sister      Social History Ms. Fager reports that she has never smoked. She has never used smokeless tobacco. Ms. Constantin reports no history  of alcohol use.   Review of Systems CONSTITUTIONAL: No weight loss, fever, chills, weakness or fatigue.  HEENT: Eyes: No visual loss, blurred vision, double vision or yellow sclerae.No hearing loss, sneezing, congestion, runny nose or sore throat.  SKIN: No rash or itching.  CARDIOVASCULAR: per hpi RESPIRATORY: No shortness of breath, cough or sputum.  GASTROINTESTINAL: No anorexia, nausea, vomiting or diarrhea. No abdominal pain or blood.  GENITOURINARY: No burning on urination, no polyuria NEUROLOGICAL: No headache, dizziness, syncope, paralysis, ataxia, numbness or tingling in the extremities. No change in bowel or bladder control.  MUSCULOSKELETAL: No muscle, back pain, joint pain or stiffness.  LYMPHATICS: No enlarged nodes. No history of splenectomy.  PSYCHIATRIC: No history of depression or anxiety.  ENDOCRINOLOGIC: No reports of sweating, cold or heat intolerance. No polyuria or polydipsia.  Marland Kitchen   Physical Examination Today's Vitals   01/07/20 1022  BP: 120/60  Pulse: 75  SpO2: 96%  Weight: 226 lb (102.5 kg)  Height: 5\' 10"  (1.778 m)   Body mass index is 32.43 kg/m.  Gen: resting comfortably, no acute distress HEENT: no scleral icterus, pupils equal round and reactive, no palptable cervical adenopathy,  CV: RRR, no m/r/g, no jvd Resp: Clear to auscultation bilaterally GI: abdomen is soft, non-tender, non-distended, normal bowel sounds, no hepatosplenomegaly MSK: extremities are warm, no edema.  Skin: warm, no rash Neuro:  no focal deficits Psych: appropriate affect   Diagnostic Studies 02/2013 Echo LVEF 55-60%, mild-mod LVH, grade I  diastolic dysfunction.    02/2013 Cartoid US IMPRESSION: Less than 50% stenosis in the right and left internal carotid arteries. There is plaque bilaterally as described above.   07/2015 echo Study Conclusions  - Left ventricle: The cavity size was normal. Wall thickness was increased in a pattern of moderate LVH. Systolic function was normal. The estimated ejection fraction was in the range of 60% to 65%. Wall motion was normal; there were no regional wall motion abnormalities. Features are consistent with a pseudonormal left ventricular filling pattern, with concomitant abnormal relaxation and increased filling pressure (grade 2 diastolic dysfunction). Doppler parameters are consistent with high ventricular filling pressure. - Aortic valve: Mildly calcified annulus. Trileaflet; mildly thickened leaflets. Valve area (VTI): 2.51 cm^2. Valve area (Vmax): 3.04 cm^2. - Left atrium: The atrium was mildly dilated. - Atrial septum: No defect or patent foramen ovale was identified. - Tricuspid valve: There was mild-moderate regurgitation. - Pulmonary arteries: Systolic pressure was mildly to moderately increased. PA peak pressure: 41 mm Hg (S). - Technically adequate study  12/02/16 echo Study Conclusions  - Left ventricle: The cavity size was normal. There was moderate concentric hypertrophy. Systolic function was moderately to severely reduced. The estimated ejection fraction was in the range of 30% to 35%. Diffuse hypokinesis. Features are consistent with a pseudonormal left ventricular filling pattern, with concomitant abnormal relaxation and increased filling pressure (grade 2 diastolic dysfunction). Doppler parameters are consistent with high ventricular filling pressure. - Regional wall motion abnormality: Moderate hypokinesis of the mid anterior, mid anteroseptal, mid anterolateral, and apical lateral myocardium; mild  hypokinesis of the mid inferoseptal myocardium. - Mitral valve: Mildly thickened leaflets . There was mild regurgitation. - Left atrium: The atrium was mildly dilated. - Right ventricle: Systolic function was reduced. - Tricuspid valve: Mildly thickened leaflets. There was moderate regurgitation. - Pulmonary arteries: PA peak pressure: 47 mm Hg (S). - Inferior vena cava: The vessel was dilated. The respirophasic diameter changes were blunted (<50%), consistent with elevated central venous pressure. - Pericardium,  extracardiac: Moderate size pericardial effusion, larger more posteriorly. Features were not consistent with tamponade physiology.  12/21/2016 echo Study Conclusions  - Left ventricle: The cavity size was normal. Wall thickness was increased in a pattern of moderate LVH. Systolic function was normal. The estimated ejection fraction was in the range of 60% to 65%. Wall motion was normal; there were no regional wall motion abnormalities. Features are consistent with a pseudonormal left ventricular filling pattern, with concomitant abnormal relaxation and increased filling pressure (grade 2 diastolic dysfunction). Doppler parameters are consistent with high ventricular filling pressure. - Aortic valve: Valve area (VTI): 2.48 cm^2. Valve area (Vmax): 2.39 cm^2. Valve area (Vmean): 2.31 cm^2. - Mitral valve: There was mild regurgitation. - Left atrium: The atrium was moderately to severely dilated. - Right atrium: The atrium was mildly dilated. - Atrial septum: No defect or patent foramen ovale was identified. - Tricuspid valve: There was moderate regurgitation. - Pulmonary arteries: Systolic pressure was moderately increased. PA peak pressure: 44 mm Hg (S). - Systemic veins: IVC is dilated, with normal respiratory variation. Estimated RA pressure is 8 mmHg. - Pericardium, extracardiac: There is a small circumferential pericardial  effusion. No evidence of tamponade physiology. - Technically adequate study.    Assessment and Plan  1. PSVT - no symptoms, contineu beta blocker  2. Chronic diastolic heart failure -euvolemic, continue current diuretics  3. HTN - at goal, continue current meds  F/u 6 months      Arnoldo Lenis, M.D

## 2020-07-21 ENCOUNTER — Encounter: Payer: Self-pay | Admitting: Cardiology

## 2020-07-21 ENCOUNTER — Encounter: Payer: Self-pay | Admitting: *Deleted

## 2020-07-21 ENCOUNTER — Ambulatory Visit (INDEPENDENT_AMBULATORY_CARE_PROVIDER_SITE_OTHER): Payer: Medicare Other | Admitting: Cardiology

## 2020-07-21 VITALS — BP 138/70 | HR 86 | Ht 70.0 in | Wt 233.0 lb

## 2020-07-21 DIAGNOSIS — I5032 Chronic diastolic (congestive) heart failure: Secondary | ICD-10-CM

## 2020-07-21 DIAGNOSIS — I471 Supraventricular tachycardia: Secondary | ICD-10-CM | POA: Diagnosis not present

## 2020-07-21 DIAGNOSIS — I10 Essential (primary) hypertension: Secondary | ICD-10-CM

## 2020-07-21 NOTE — Patient Instructions (Signed)

## 2020-07-21 NOTE — Progress Notes (Signed)
Clinical Summary Mary [name] is a 75 y.o.female seen today for follow up of the following medical problems.   1.PSVT - compliant with beta blocker  - denies any palpitaitons.   2. Chronicsystolic/diastolic heart failure -admit 11/2016 with severe CHF, diuresed over 17 liters. Echo at that time showed a drop in LVEF to 30-35%, however on more recent repeat her LVEF has now normalized.    - no recent edema - no recent SOB  3. HTN -compliant with meds   4. Hyperlipidemia -labs followed by pcp - compliant with statin   5. Pericardial effusion -chronic, noted to be small by last echo 11/2016  6. OSA -has not been using home CPAP - not comfortable wearing cpap   7. CKDV - followed by Dr Theador Hawthorne     Past Medical History:  Diagnosis Date  . Anemia   . Anxiety   . Arthritis    knees  . Cellulitis and abscess of foot 02/12/2012  . CHF (congestive heart failure) ([place])   . Complication of anesthesia    pt states after her hysterectomy the doctor said she was "wild" when she woke up   . Diabetes mellitus   . Diabetes mellitus with neuropathy 07/25/2011  . Dysrhythmia    tachy- takes Metoprolol  . Hypertension   . OSA (obstructive sleep apnea) 07/25/2011   not using CPAP  . Osteomyelitis of right foot ([place]) 02/14/2012  . Partial Achilles tendon tear 02/14/2012  . Pneumonia   . Restless leg syndrome 07/25/2011  . Restless leg syndrome   . Wound, open    right foot     Allergies  Allergen Reactions  . Morphine     UNSPECIFIED REACTION      Current Outpatient Medications  Medication Sig Dispense Refill  . amLODipine (NORVASC) 10 MG tablet Take 10 mg by mouth daily.    . ARNUITY ELLIPTA 100 MCG/ACT AEPB     . Baclofen 5 MG TABS Take 5 mg by mouth at bedtime as needed (muscle spasms).    . calcium acetate (PHOSLO) 667 MG capsule Take 2 capsules (1,334 mg total) by mouth 3 (three) times daily with meals. (Patient taking differently: Take  667 mg by mouth 3 (three) times daily with meals. )    . Cholecalciferol (VITAMIN D3) 1.25 MG (50000 UT) TABS Take by mouth daily.    . diazepam (VALIUM) 2 MG tablet Take 1 tablet (2 mg total) by mouth 2 (two) times daily. 12 tablet 0  . doxazosin (CARDURA) 4 MG tablet Take 4 mg by mouth at bedtime.     . Dulaglutide (TRULICITY) 4.5 0000000 SOPN Inject 4.5 mg into the skin every Saturday.    . DULoxetine (CYMBALTA) 60 MG capsule Take 60 mg by mouth daily.    Marland Kitchen epoetin alfa-epbx (RETACRIT) 4000 UNIT/ML injection Inject 4,000 Units into the skin every 14 (fourteen) days.     . ferrous sulfate 325 (65 FE) MG tablet Take 325 mg by mouth daily.     Marland Kitchen gabapentin (NEURONTIN) 300 MG capsule Take 300 mg by mouth 3 (three) times daily.    . insulin detemir (LEVEMIR) 100 UNIT/ML injection Inject 0.15 mLs (15 Units total) into the skin at bedtime. 10 mL 11  . insulin lispro (HUMALOG) 100 UNIT/ML injection Inject 4 Units into the skin 3 (three) times daily before meals.    Marland Kitchen LANTUS SOLOSTAR 100 UNIT/ML Solostar Pen 10 Units daily.     Marland Kitchen levothyroxine (SYNTHROID) 25 MCG  tablet Take 25 mcg by mouth daily before breakfast.    . loperamide (IMODIUM A-D) 2 MG tablet Take 2 mg by mouth 4 (four) times daily as needed for diarrhea or loose stools.    . magnesium oxide (MAG-OX) 400 MG tablet Take 400 mg by mouth daily.    . methocarbamol (ROBAXIN) 500 MG tablet Take 1,500 mg by mouth 4 (four) times daily.     . metolazone (ZAROXOLYN) 5 MG tablet Take 5 mg by mouth 3 (three) times a week. Monday Wednesday Friday    . metoprolol tartrate (LOPRESSOR) 25 MG tablet Take 25 mg by mouth 2 (two) times daily.     . Multiple Vitamin (MULTIVITAMIN) tablet Take 1 tablet by mouth daily.    . NYSTATIN powder Apply topically as needed.    Marland Kitchen omeprazole (PRILOSEC) 20 MG capsule Take 20 mg by mouth daily.    Marland Kitchen oxyCODONE-acetaminophen (PERCOCET) 7.5-325 MG tablet Take 1 tablet by mouth every 8 (eight) hours as needed for severe pain.  6 tablet 0  . polyethylene glycol (MIRALAX / GLYCOLAX) packet Take 17 g by mouth daily as needed for mild constipation. (Patient taking differently: Take 17 g by mouth daily. ) 14 each 0  . pravastatin (PRAVACHOL) 10 MG tablet Take 10 mg by mouth daily.    . promethazine (PHENERGAN) 25 MG suppository Place 25 mg rectally every 4 (four) hours as needed for nausea or vomiting.    Marland Kitchen rOPINIRole (REQUIP) 2 MG tablet Take 4 mg by mouth at bedtime.     . sertraline (ZOLOFT) 50 MG tablet Take 50 mg by mouth daily.    . sodium polystyrene (KAYEXALATE) powder Take 30 g by mouth once. Taking every Wednesday    . tiZANidine (ZANAFLEX) 4 MG tablet Take 4 mg by mouth 3 (three) times daily.    . TORSEMIDE PO Take 50 mg by mouth daily.    . Vitamin D, Ergocalciferol, (DRISDOL) 1.25 MG (50000 UT) CAPS capsule Take 50,000 Units by mouth every Monday.     No current facility-administered medications for this visit.     Past Surgical History:  Procedure Laterality Date  . ABDOMINAL HYSTERECTOMY    . ACHILLES TENDON REPAIR    . AMPUTATION Right 11/25/2012   Procedure: AMPUTATION BELOW KNEE;  Surgeon: Newt Minion, MD;  Location: [place];  Service: Orthopedics;  Laterality: Right;  Right Below Knee Amputation  . AV FISTULA PLACEMENT Left 08/16/2018   Procedure: CREATION OF ARTERIOVENOUS FISTULA LEFT ARM;  Surgeon: Rosetta Posner, MD;  Location: [place];  Service: Vascular;  Laterality: Left;  . BELOW KNEE LEG AMPUTATION Right 11/25/2012   Dr Sharol Given  . BREAST SURGERY Left    Lumpectomy non ca  . CATARACT EXTRACTION W/PHACO Right 10/09/2013   Procedure: CATARACT EXTRACTION PHACO AND INTRAOCULAR LENS PLACEMENT (IOC);  Surgeon: Elta Guadeloupe T. Gershon Crane, MD;  Location: [facility];  Service: Ophthalmology;  Laterality: Right;  CDE:9.05  . CATARACT EXTRACTION W/PHACO Left 10/23/2013   Procedure: ATTEMPTED CATARACT EXTRACTION PHACO AND INTRAOCULAR LENS PLACEMENT;  Surgeon: Elta Guadeloupe T. Gershon Crane, MD;  Location: [facility];  Service: Ophthalmology;   Laterality: Left;  CDE:  4.41  . TOE AMPUTATION     partial rt great toe     Allergies  Allergen Reactions  . Morphine     UNSPECIFIED REACTION       Family History  Problem Relation Age of Onset  . Diabetes Sister      Social History Ms. Ketring reports  that she has never smoked. She has never used smokeless tobacco. Ms. Kishbaugh reports no history of alcohol use.   Review of Systems CONSTITUTIONAL: No weight loss, fever, chills, weakness or fatigue.  HEENT: Eyes: No visual loss, blurred vision, double vision or yellow sclerae.No hearing loss, sneezing, congestion, runny nose or sore throat.  SKIN: No rash or itching.  CARDIOVASCULAR: per hpi RESPIRATORY: No shortness of breath, cough or sputum.  GASTROINTESTINAL: No anorexia, nausea, vomiting or diarrhea. No abdominal pain or blood.  GENITOURINARY: No burning on urination, no polyuria NEUROLOGICAL: No headache, dizziness, syncope, paralysis, ataxia, numbness or tingling in the extremities. No change in bowel or bladder control.  MUSCULOSKELETAL: No muscle, back pain, joint pain or stiffness.  LYMPHATICS: No enlarged nodes. No history of splenectomy.  PSYCHIATRIC: No history of depression or anxiety.  ENDOCRINOLOGIC: No reports of sweating, cold or heat intolerance. No polyuria or polydipsia.  Marland Kitchen   Physical Examination Today's Vitals   07/21/20 0830  BP: 138/70  Pulse: 86  SpO2: 97%  Weight: 233 lb (105.7 kg)  Height: '5\' 10"'$  (1.778 m)   Body mass index is 33.43 kg/m.  Gen: resting comfortably, no acute distress HEENT: no scleral icterus, pupils equal round and reactive, no palptable cervical adenopathy,  CV: RRR, no m/r/g, no jvd Resp: Clear to auscultation bilaterally GI: abdomen is soft, non-tender, non-distended, normal bowel sounds, no hepatosplenomegaly MSK: extremities are warm, no edema.  Skin: warm, no rash Neuro:  no focal deficits Psych: appropriate affect   Diagnostic Studies  02/2013  Echo LVEF 55-60%, mild-mod LVH, grade I diastolic dysfunction.    02/2013 Cartoid US IMPRESSION: Less than 50% stenosis in the right and left internal carotid arteries. There is plaque bilaterally as described above.   07/2015 echo Study Conclusions  - Left ventricle: The cavity size was normal. Wall thickness was increased in a pattern of moderate LVH. Systolic function was normal. The estimated ejection fraction was in the range of 60% to 65%. Wall motion was normal; there were no regional wall motion abnormalities. Features are consistent with a pseudonormal left ventricular filling pattern, with concomitant abnormal relaxation and increased filling pressure (grade 2 diastolic dysfunction). Doppler parameters are consistent with high ventricular filling pressure. - Aortic valve: Mildly calcified annulus. Trileaflet; mildly thickened leaflets. Valve area (VTI): 2.51 cm^2. Valve area (Vmax): 3.04 cm^2. - Left atrium: The atrium was mildly dilated. - Atrial septum: No defect or patent foramen ovale was identified. - Tricuspid valve: There was mild-moderate regurgitation. - Pulmonary arteries: Systolic pressure was mildly to moderately increased. PA peak pressure: 41 mm Hg (S). - Technically adequate study  12/02/16 echo Study Conclusions  - Left ventricle: The cavity size was normal. There was moderate concentric hypertrophy. Systolic function was moderately to severely reduced. The estimated ejection fraction was in the range of 30% to 35%. Diffuse hypokinesis. Features are consistent with a pseudonormal left ventricular filling pattern, with concomitant abnormal relaxation and increased filling pressure (grade 2 diastolic dysfunction). Doppler parameters are consistent with high ventricular filling pressure. - Regional wall motion abnormality: Moderate hypokinesis of the mid anterior, mid anteroseptal, mid anterolateral, and  apical lateral myocardium; mild hypokinesis of the mid inferoseptal myocardium. - Mitral valve: Mildly thickened leaflets . There was mild regurgitation. - Left atrium: The atrium was mildly dilated. - Right ventricle: Systolic function was reduced. - Tricuspid valve: Mildly thickened leaflets. There was moderate regurgitation. - Pulmonary arteries: PA peak pressure: 47 mm Hg (S). - Inferior vena cava: The vessel  was dilated. The respirophasic diameter changes were blunted (<50%), consistent with elevated central venous pressure. - Pericardium, extracardiac: Moderate size pericardial effusion, larger more posteriorly. Features were not consistent with tamponade physiology.  12/21/2016 echo Study Conclusions  - Left ventricle: The cavity size was normal. Wall thickness was increased in a pattern of moderate LVH. Systolic function was normal. The estimated ejection fraction was in the range of 60% to 65%. Wall motion was normal; there were no regional wall motion abnormalities. Features are consistent with a pseudonormal left ventricular filling pattern, with concomitant abnormal relaxation and increased filling pressure (grade 2 diastolic dysfunction). Doppler parameters are consistent with high ventricular filling pressure. - Aortic valve: Valve area (VTI): 2.48 cm^2. Valve area (Vmax): 2.39 cm^2. Valve area (Vmean): 2.31 cm^2. - Mitral valve: There was mild regurgitation. - Left atrium: The atrium was moderately to severely dilated. - Right atrium: The atrium was mildly dilated. - Atrial septum: No defect or patent foramen ovale was identified. - Tricuspid valve: There was moderate regurgitation. - Pulmonary arteries: Systolic pressure was moderately increased. PA peak pressure: 44 mm Hg (S). - Systemic veins: IVC is dilated, with normal respiratory variation. Estimated RA pressure is 8 mmHg. - Pericardium, extracardiac: There is a small  circumferential pericardial effusion. No evidence of tamponade physiology. - Technically adequate study.   Assessment and Plan  1. PSVT -doing well without symptoms, continue beta blocker.   2. Chronic diastolic heart failure -appears euvoelmic, continue diuretics.   3. HTN -she is essentially at goal, continue current meds        Arnoldo Lenis, M.D.

## 2020-07-30 IMAGING — US US EXTREM UP VEIN MAPPING
1 series · 13 of 24 positions shown · non-contrast
Comparison: None.

CLINICAL DATA: 72-year-old female with renal failure

EXAM:
US EXTREM UP VEIN MAPPING

[Series 1: us extrem up vein mapping · 13 of 65 slices shown]
[im 1/65]
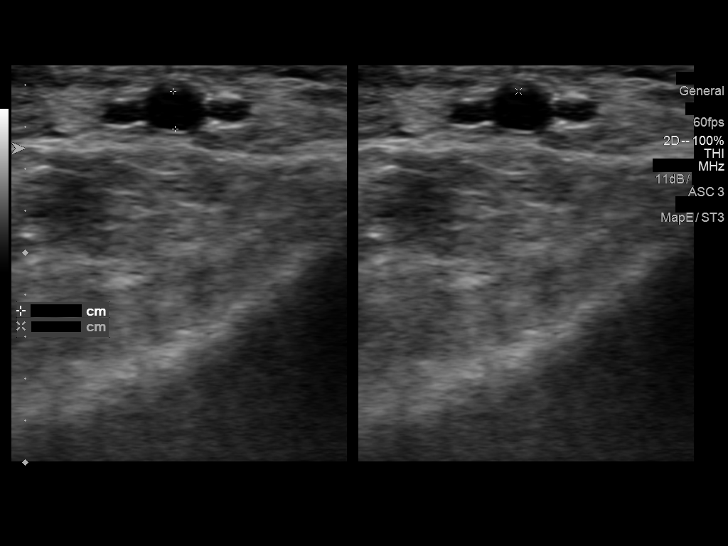
[im 6/65]
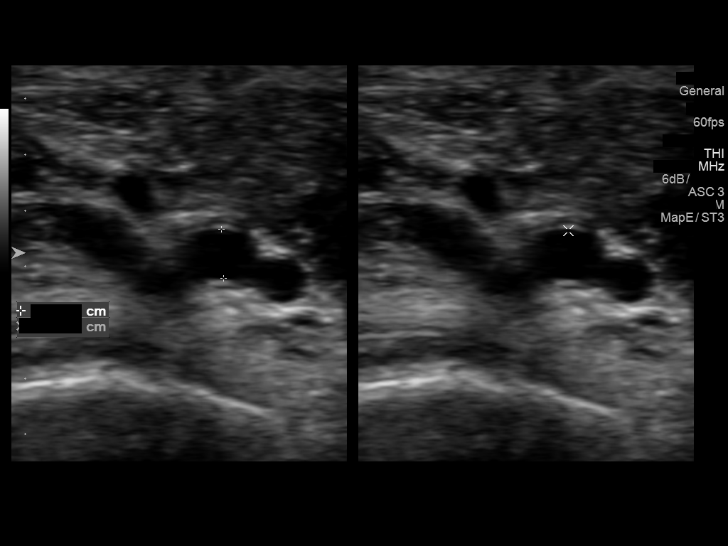
[im 12/65]
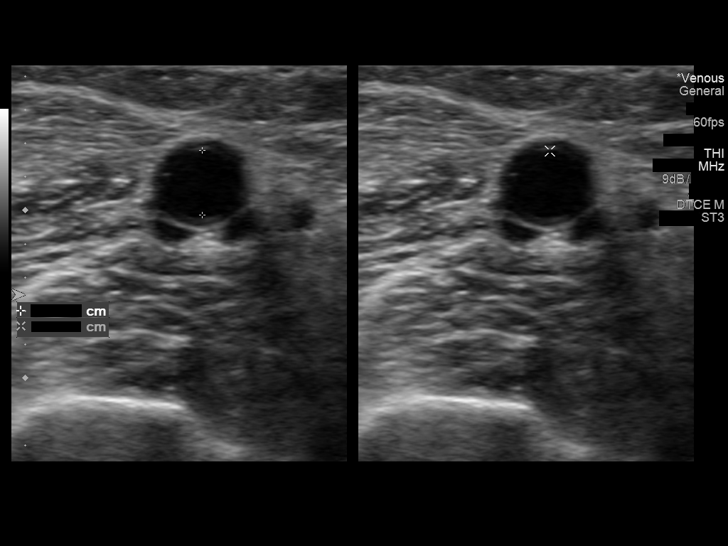
[im 17/65]
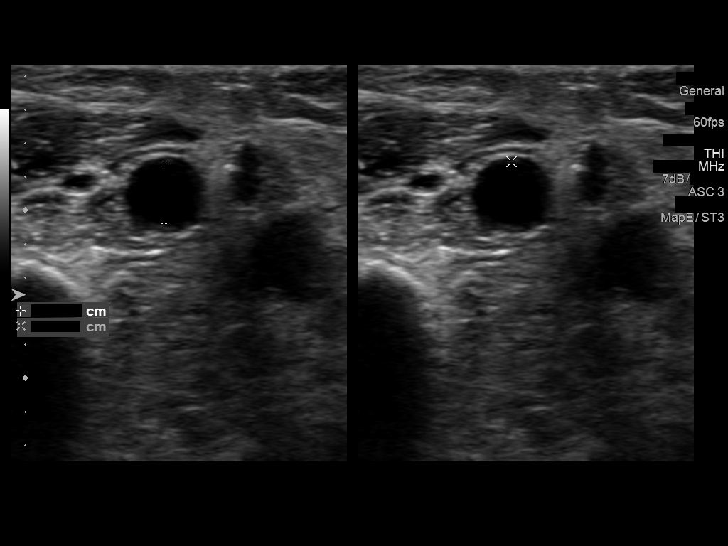
[im 23/65]
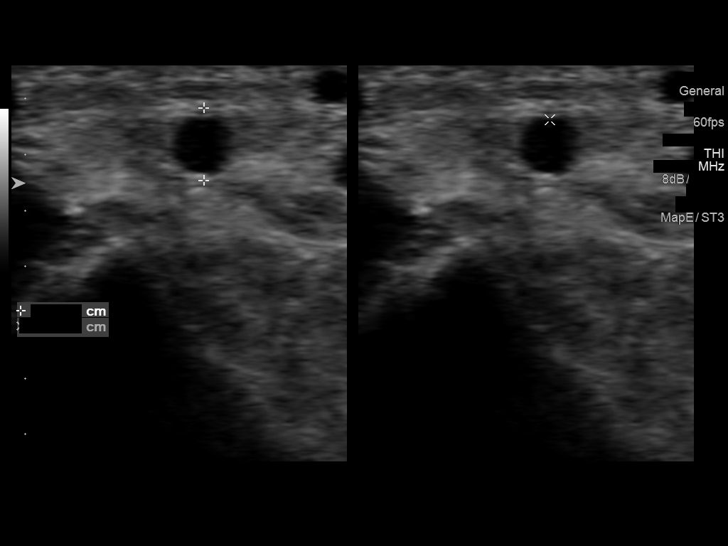
[im 28/65]
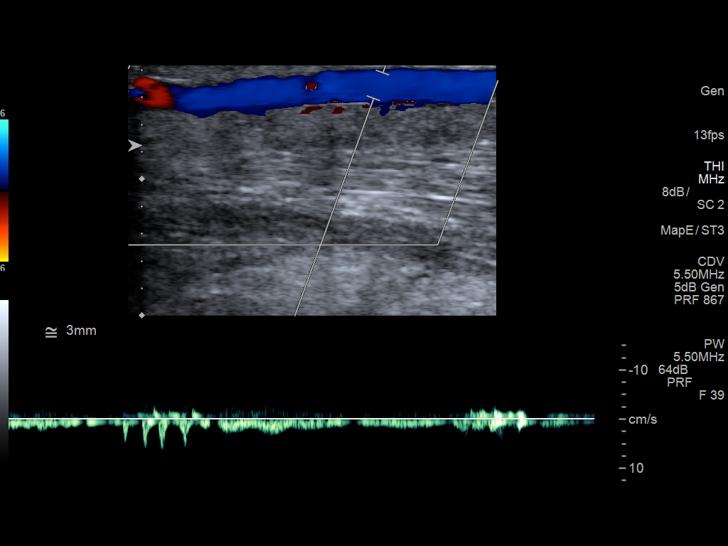
[im 34/65]
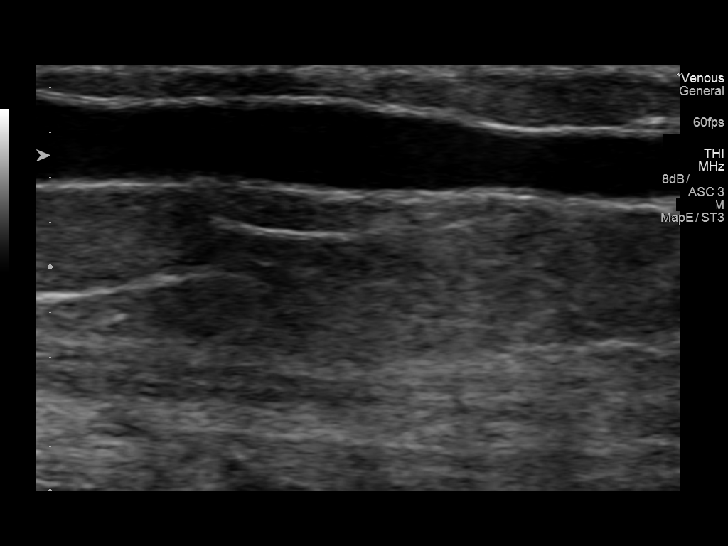
[im 37/65]
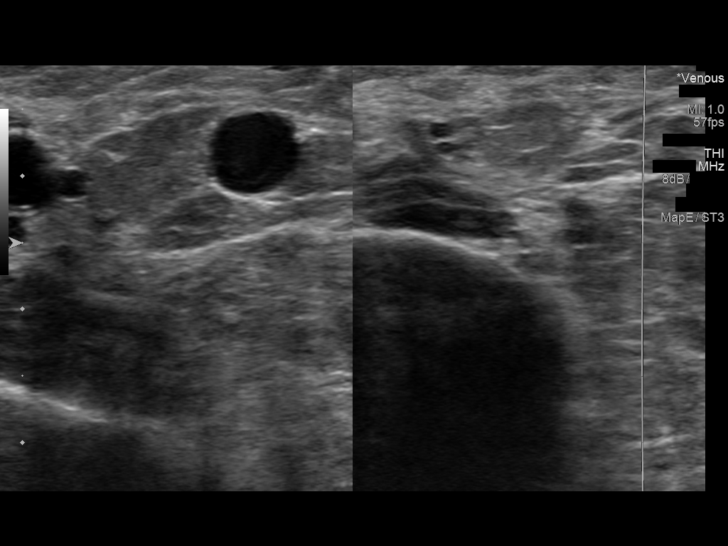
[im 42/65]
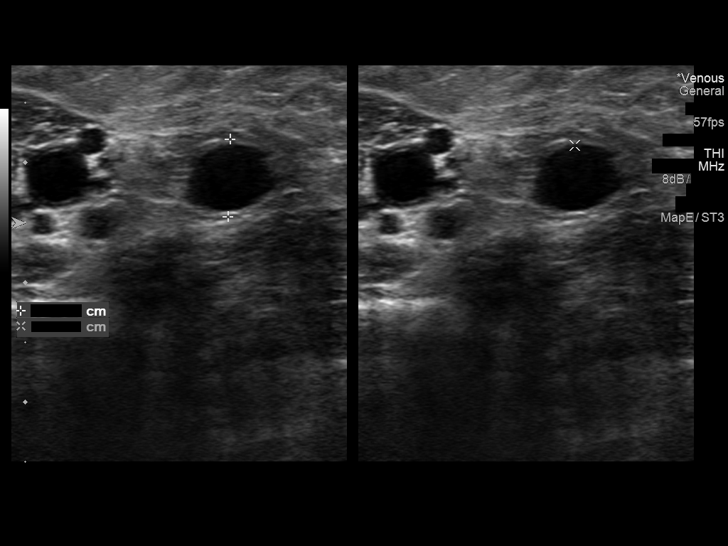
[im 48/65]
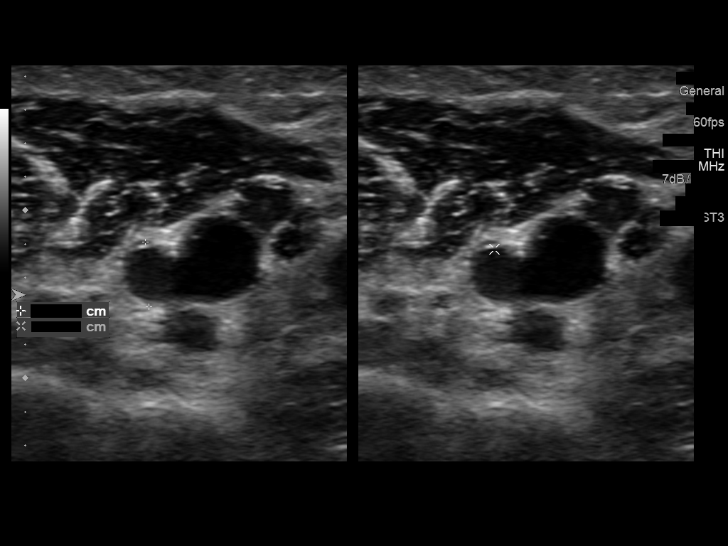
[im 53/65]
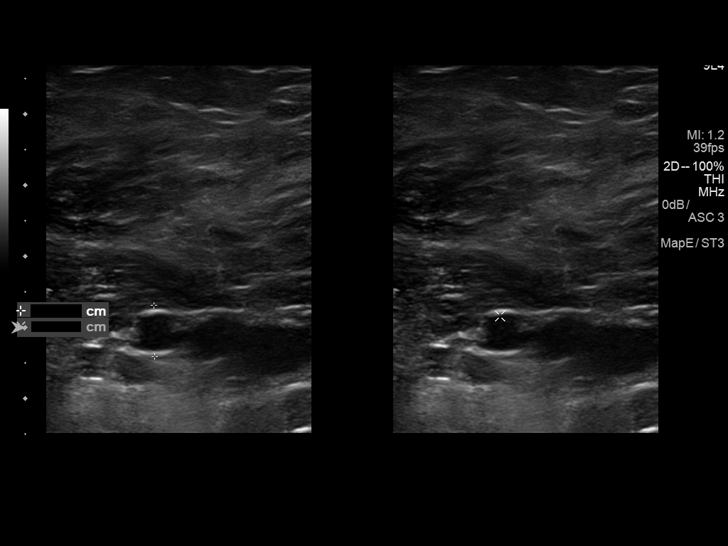
[im 59/65]
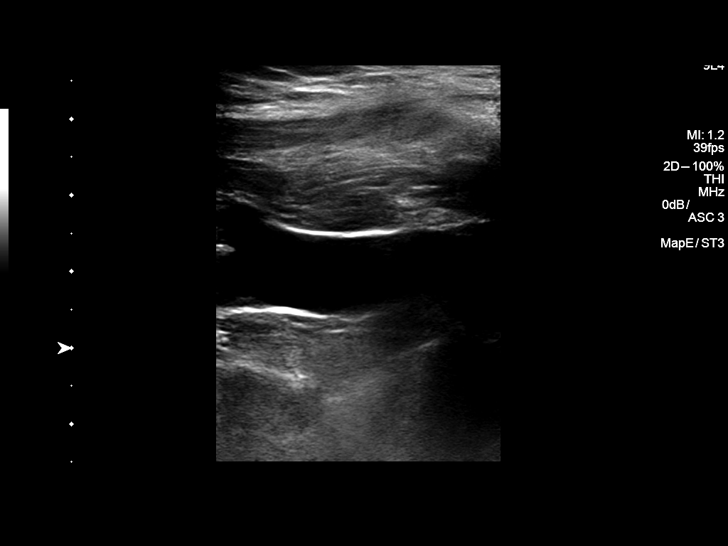
[im 65/65]
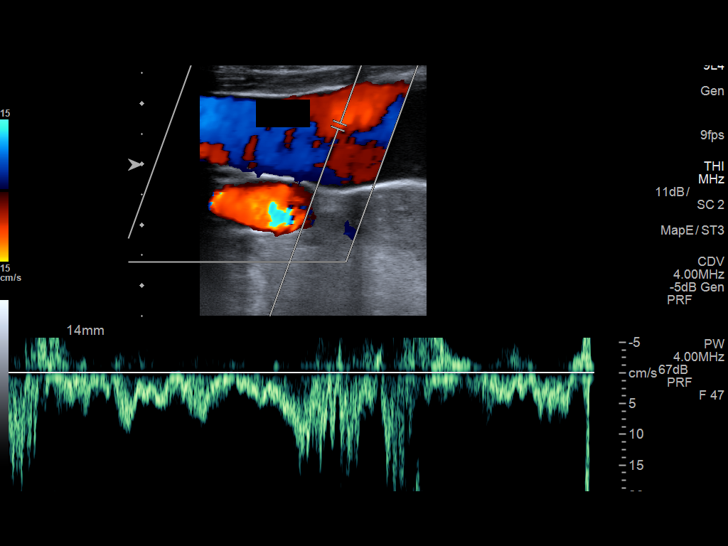

[13 of 24 positions shown; findings below may reference images not displayed]

FINDINGS: RIGHT ARTERIES

Wrist Radial Artery:

Size 2mm Waveform Triphasic

Wrist Ulnar Artery:

Size 2mm Waveform Triphasic

Prox. Forearm Radial Artery:

Size 2mm Waveform Triphasic

Upper Arm Brachial Artery:

Size 3.6mm Waveform Triphasic

RIGHT VEINS

Forearm Cephalic Vein:

Prox 3.1mm Distal 2.6mm Depth 2.7mm

Upper Arm Cephalic Vein:

Prox 2.7mm Distal 2.0mm Depth 2.4mm

Upper Arm Basilic Vein:

Prox 6.5mm Distal by 6.9mm Depth 8.5mm

Upper Arm Brachial Vein:

Prox 3.9mm Distal 2.7mm Depth 12.2mm

ADDITIONAL RIGHT VEINS

Axillary Vein:  7.2mm

Subclavian Vein:

Patient: Yes Respiratory Phasicity: Present

Internal Jugular Vein:

Patent: Yes    Respiratory Phasicity: Present

LEFT ARTERIES

Wrist Radial Artery:

Size 2.3mm Waveform Triphasic

Wrist Ulnar Artery:

Size 2mm Waveform Triphasic

Prox. Forearm Radial Artery:

Size 2.2mm Waveform Triphasic

Upper Arm Brachial Artery:

Size 3.5mm Waveform Triphasic

LEFT VEINS

Forearm Cephalic Vein:

Prox 4.7mm Distal 2.8mm Depth 1.5mm

Upper Arm Cephalic Vein:

Prox 4.0mm Distal 4.8mm Depth 3.0mm

Upper Arm Basilic Vein:

Prox 6.6mm Distal 5.7mm Depth 7.4mm

Upper Arm Brachial Vein:

Prox 4.6mm Distal 3.6mm Depth 7.7mm

ADDITIONAL LEFT VEINS

Axillary Vein:  8.4mm

Subclavian Vein:

Patient: Yes Respiratory Phasicity: Present

Internal Jugular Vein:

Patent: Yes    Respiratory Phasicity: Present
IMPRESSION: Bilateral upper extremity venous and arterial mapping, as above.

## 2020-10-24 ENCOUNTER — Other Ambulatory Visit (HOSPITAL_COMMUNITY): Payer: Self-pay | Admitting: Nephrology

## 2020-10-24 ENCOUNTER — Other Ambulatory Visit (HOSPITAL_COMMUNITY): Payer: Self-pay | Admitting: Physician Assistant

## 2020-10-24 DIAGNOSIS — N186 End stage renal disease: Secondary | ICD-10-CM

## 2020-10-28 ENCOUNTER — Ambulatory Visit (HOSPITAL_COMMUNITY)
Admission: RE | Admit: 2020-10-28 | Discharge: 2020-10-28 | Disposition: A | Discharge status: Home / Self Care | Payer: Medicare Other | Source: Ambulatory Visit | Attending: Nephrology | Admitting: Nephrology

## 2020-10-28 ENCOUNTER — Other Ambulatory Visit: Payer: Self-pay

## 2020-10-28 ENCOUNTER — Other Ambulatory Visit (HOSPITAL_COMMUNITY): Payer: Self-pay | Admitting: Nephrology

## 2020-10-28 DIAGNOSIS — N186 End stage renal disease: Secondary | ICD-10-CM

## 2020-10-28 DIAGNOSIS — Z992 Dependence on renal dialysis: Secondary | ICD-10-CM | POA: Insufficient documentation

## 2020-10-28 DIAGNOSIS — G4733 Obstructive sleep apnea (adult) (pediatric): Secondary | ICD-10-CM | POA: Diagnosis not present

## 2020-10-28 DIAGNOSIS — E1142 Type 2 diabetes mellitus with diabetic polyneuropathy: Secondary | ICD-10-CM | POA: Insufficient documentation

## 2020-10-28 DIAGNOSIS — E1122 Type 2 diabetes mellitus with diabetic chronic kidney disease: Secondary | ICD-10-CM | POA: Diagnosis not present

## 2020-10-28 DIAGNOSIS — I509 Heart failure, unspecified: Secondary | ICD-10-CM | POA: Insufficient documentation

## 2020-10-28 DIAGNOSIS — I132 Hypertensive heart and chronic kidney disease with heart failure and with stage 5 chronic kidney disease, or end stage renal disease: Secondary | ICD-10-CM | POA: Diagnosis not present

## 2020-10-28 HISTORY — PX: IR FLUORO GUIDE CV LINE RIGHT: IMG2283

## 2020-10-28 HISTORY — PX: IR US GUIDE VASC ACCESS RIGHT: IMG2390

## 2020-10-28 LAB — GLUCOSE, CAPILLARY: Glucose-Capillary: 95 mg/dL (ref 70–99)

## 2020-10-28 MED ORDER — CEFAZOLIN SODIUM-DEXTROSE 2-4 GM/100ML-% IV SOLN
INTRAVENOUS | Status: AC
  Administered 2020-10-28: 2 g via INTRAVENOUS
  Filled 2020-10-28: qty 100

## 2020-10-28 MED ORDER — MIDAZOLAM HCL 2 MG/2ML IJ SOLN
INTRAMUSCULAR | Status: AC
  Filled 2020-10-28: qty 2

## 2020-10-28 MED ORDER — FENTANYL CITRATE (PF) 100 MCG/2ML IJ SOLN
INTRAMUSCULAR | Status: AC | PRN
  Administered 2020-10-28: 50 ug via INTRAVENOUS

## 2020-10-28 MED ORDER — FENTANYL CITRATE (PF) 100 MCG/2ML IJ SOLN
INTRAMUSCULAR | Status: AC
  Filled 2020-10-28: qty 2

## 2020-10-28 MED ORDER — LIDOCAINE HCL 1 % IJ SOLN
INTRAMUSCULAR | Status: AC
  Administered 2020-10-28: 6 mL via SUBCUTANEOUS
  Filled 2020-10-28: qty 20

## 2020-10-28 MED ORDER — SODIUM CHLORIDE 0.9 % IV SOLN: INTRAVENOUS | Status: DC

## 2020-10-28 MED ORDER — HEPARIN SODIUM (PORCINE) 1000 UNIT/ML IJ SOLN
INTRAMUSCULAR | Status: AC
  Administered 2020-10-28: 3.2 [IU] via INTRAVENOUS_CENTRAL
  Filled 2020-10-28: qty 1

## 2020-10-28 MED ORDER — CEFAZOLIN SODIUM-DEXTROSE 2-4 GM/100ML-% IV SOLN: 2.0000 g | Freq: Once | INTRAVENOUS | Status: AC

## 2020-10-28 MED ORDER — MIDAZOLAM HCL 2 MG/2ML IJ SOLN
INTRAMUSCULAR | Status: AC | PRN
  Administered 2020-10-28: 1 mg via INTRAVENOUS

## 2020-10-28 NOTE — Progress Notes (Signed)
Pt assisted to get dressed and moved into wheelchair. [facility] transportation on the way to pick up patient. HD site without bleeding or hematoma.

## 2020-10-28 NOTE — Discharge Instructions (Addendum)
Central Line Dialysis Access Placement, Care After This sheet gives you information about how to care for yourself after your procedure. Your health care provider may also give you more specific instructions. If you have problems or questions, contact your health care provider. What can I expect after the procedure? After the procedure, it is common to have:  Mild pain or discomfort.  Mild redness, swelling, or bruising around your incision.  A small amount of blood or clear fluid coming from your incision. Follow these instructions at home: Medicines  Take over-the-counter and prescription medicines only as told by your health care provider.  If you were prescribed an antibiotic medicine, take it as told by your health care provider. Do not stop using the antibiotic even if you start to feel better. Incision care  Follow instructions from your health care provider about how to take care of your incision. Make sure you: ? Wash your hands with soap and water for at least 20 seconds before and after you change your bandage (dressing). If soap and water are not available, use hand sanitizer. ? Change your dressing as told by your health care provider. ? Leave stitches (sutures) in place.  Check your incision area every day for signs of infection. Check for: ? More redness, swelling, or pain. ? More fluid or blood. ? Warmth. ? Pus or a bad smell.   Managing pain and swelling  If directed, apply heat to the affected area as often as told by your health care provider. Use the heat source that your health care provider recommends, such as a moist heat pack or a heating pad. ? Place a towel between your skin and the heat source. ? Leave the heat on for 20-30 minutes. ? Remove the heat if your skin turns bright red. This is especially important if you are unable to feel pain, heat, or cold. You may have a greater risk of getting burned.  If directed, put ice on the catheter site. To do  this: ? Put ice in a plastic bag. ? Place a towel between your skin and the bag. ? Leave the ice on for 20 minutes, 2-3 times a day. Activity  If you were given a sedative during the procedure, it can affect you for several hours. Do not drive or operate machinery until your health care provider says that it is safe.  Do not lift anything that is heavier than 10 lb (4.5 kg), or the limit that you are told, until your health care provider says that it is safe.  Return to your normal activities as told by your health care provider. Ask your health care provider what activities are safe for you. General instructions  Follow instructions from your health care provider about eating or drinking restrictions.  Do not use any products that contain nicotine or tobacco, such as cigarettes, e-cigarettes, and chewing tobacco. These can delay incision healing after surgery. If you need help quitting, ask your health care provider.  Do not take baths, swim, or use a hot tub until your health care provider approves. Ask your health care provider if you may take showers. You may only be allowed to take sponge baths.  Wear compression stockings as told by your health care provider. These stockings help to prevent blood clots and reduce swelling in your legs.  Keep all follow-up visits as told by your health care provider. This is important.   Contact a health care provider if:  Your catheter gets pulled   out of place.  Your catheter site becomes itchy.  You develop a rash around your catheter site.  You have any of these signs of infection: ? More redness, swelling, or pain around your incision. ? More fluid or blood coming from your incision. ? Warmth coming from your incision. ? Pus or a bad smell coming from your incision. ? A fever. Get help right away if:  You become light-headed or dizzy.  You have chest pain or a fast heart rate.  You faint.  You have difficulty breathing.  Your  catheter gets pulled out completely.  You have discomfort in your arms, back, neck, or jaw. These symptoms may represent a serious problem that is an emergency. Do not wait to see if the symptoms will go away. Get medical help right away. Call your local emergency services (911 in the [place] Do not drive yourself to the hospital. Summary  After the procedure, it is common to have mild symptoms of pain, redness, swelling, or bruising around your incision. It is also common to have a small amount of blood or clear fluid coming from your incision.  Check your incision area every day for signs of infection.  Do not take baths, swim, or use a hot tub until your health care provider approves. Ask your health care provider if you may take showers. You may only be allowed to take sponge baths.  Get help right away if you have chest pain or difficulty breathing, or if your catheter gets pulled out completely. This information is not intended to replace advice given to you by your health care provider. Make sure you discuss any questions you have with your health care provider. Document Revised: 04/05/2019 Document Reviewed: 04/05/2019 Elsevier Patient Education  2021 [employer] Moderate Conscious Sedation, Adult Sedation is the use of medicines to promote relaxation and to relieve discomfort and anxiety. Moderate conscious sedation is a type of sedation. Under moderate conscious sedation, you are less alert than normal, but you are still able to respond to instructions, touch, or both. Moderate conscious sedation is used during short medical and dental procedures. It is milder than deep sedation, which is a type of sedation under which you cannot be easily woken up. It is also milder than general anesthesia, which is the use of medicines to make you unconscious. Moderate conscious sedation allows you to return to your regular activities sooner. Tell a health care provider about:  Any allergies you  have.  All medicines you are taking, including vitamins, herbs, eye drops, creams, and over-the-counter medicines.  Any use of steroids. This includes steroids taken by mouth or as a cream.  Any problems you or family members have had with sedatives and anesthetic medicines.  Any blood disorders you have.  Any surgeries you have had.  Any medical conditions you have, such as sleep apnea.  Whether you are pregnant or may be pregnant.  Any use of cigarettes, alcohol, marijuana, or drugs. What are the risks? Generally, this is a safe procedure. However, problems may occur, including:  Getting too much medicine (oversedation).  Nausea.  Allergic reaction to medicines.  Trouble breathing. If this happens, a breathing tube may be used. It will be removed when you are awake and breathing on your own.  Heart trouble.  Lung trouble.  Confusion that gets better with time (emergence delirium). What happens before the procedure? Staying hydrated Follow instructions from your health care provider about hydration, which may include:  Up to 2   hours before the procedure - you may continue to drink clear liquids, such as water, clear fruit juice, black coffee, and plain tea. Eating and drinking restrictions Follow instructions from your health care provider about eating and drinking, which may include:  8 hours before the procedure - stop eating heavy meals or foods, such as meat, fried foods, or fatty foods.  6 hours before the procedure - stop eating light meals or foods, such as toast or cereal.  6 hours before the procedure - stop drinking milk or drinks that contain milk.  2 hours before the procedure - stop drinking clear liquids. Medicines Ask your health care provider about:  Changing or stopping your regular medicines. This is especially important if you are taking diabetes medicines or blood thinners.  Taking medicines such as aspirin and ibuprofen. These medicines can  thin your blood. Do not take these medicines unless your health care provider tells you to take them.  Taking over-the-counter medicines, vitamins, herbs, and supplements. Tests and exams  You will have a physical exam.  You may have blood tests done to show how well: ? Your kidneys and liver work. ? Your blood clots. General instructions  Plan to have a responsible adult take you home from the hospital or clinic.  If you will be going home right after the procedure, plan to have a responsible adult care for you for the time you are told. This is important. What happens during the procedure?  You will be given the sedative. The sedative may be given: ? As a pill that you will swallow. It can also be inserted into the rectum. ? As a spray through the nose. ? As an injection into the muscle. ? As an injection into the vein through an IV.  You may be given oxygen as needed.  Your breathing, heart rate, and blood pressure will be monitored during the procedure.  The medical or dental procedure will be done. The procedure may vary among health care providers and hospitals.   What happens after the procedure?  Your blood pressure, heart rate, breathing rate, and blood oxygen level will be monitored until you leave the hospital or clinic.  You will get fluids through your IV if needed.  Do not drive or operate machinery until your health care provider says that it is safe. Summary  Sedation is the use of medicines to promote relaxation and to relieve discomfort and anxiety. Moderate conscious sedation is a type of sedation that is used during short medical and dental procedures.  Tell the health care provider about any medical conditions that you have and about all the medicines that you are taking.  You will be given the sedative as a pill, a spray through the nose, an injection into the muscle, or an injection into the vein through an IV. Vital signs are monitored during the  sedation.  Moderate conscious sedation allows you to return to your regular activities sooner. This information is not intended to replace advice given to you by your health care provider. Make sure you discuss any questions you have with your health care provider. Document Revised: 09/14/2019 Document Reviewed: 04/12/2019 Elsevier Patient Education  2021 Elsevier Inc.  

## 2020-10-28 NOTE — H&P (Signed)
   Patient Status: Mary [name] - Out-pt  Assessment and Plan: ESRD in need of HD and access for initiation. Plan for image-guided tunneled HD catheter placement today in IR. Patient is NPO. Afebrile.  Risks and benefits discussed with the patient including, but not limited to bleeding, infection, vascular injury, pneumothorax which may require chest tube placement, air embolism or even death. All of the patient's questions were answered, patient is agreeable to proceed. Consent signed and in chart.  ______________________________________________________________________   History of Present Illness: Mary [name] is a 75 y.o. female with a past medical history of hypertension, HF, ESRD on HD, diabetes mellitus with associated peripheral neuropathy, OSA not on CPAP, arthritis, RLS, and s/p right BKA. She has had CKD for years. She had a LUE AVF surgically created 08/16/2018 by Dr. Donnetta Hutching to prepare for dialysis, however this was never used. She now has ESRD and is in need of initiation of HD, along with access for this.  IR consulted by Dr. Theador Hawthorne for possible image-guided tunneled HD catheter placement. Patient awake and alert laying in bed. Complains of RLS/right phantom pains, rated 10/10 at this time. Denies fever, chills, chest pain, dyspnea, abdominal pain, or headache.   Allergies and medications reviewed.   Review of Systems: A 12 point ROS discussed and pertinent positives are indicated in the HPI above.  All other systems are negative.  Review of Systems  Constitutional: Negative for chills and fever.  Respiratory: Negative for shortness of breath and wheezing.   Cardiovascular: Negative for chest pain and palpitations.  Gastrointestinal: Negative for abdominal pain.  Musculoskeletal:       Positive for right leg pain.  Neurological: Negative for headaches.    Vital Signs: BP (!) 196/88   Pulse 66   Temp (!) 97.5 F (36.4 C) (Oral)   Resp 20   LMP 07/25/2011    SpO2 98%   Physical Exam Vitals and nursing note reviewed.  Constitutional:      General: She is not in acute distress. Cardiovascular:     Rate and Rhythm: Normal rate and regular rhythm.     Heart sounds: Normal heart sounds. No murmur heard.   Pulmonary:     Effort: Pulmonary effort is normal. No respiratory distress.     Breath sounds: Normal breath sounds. No wheezing.  Musculoskeletal:     Comments: (+) right BKA.  Skin:    General: Skin is warm and dry.  Neurological:     Mental Status: She is alert and oriented to person, place, and time.      Imaging reviewed.   Labs:  COAGS: No results for input(s): INR, APTT in the last 8760 hours.  BMP: No results for input(s): NA, K, CL, CO2, GLUCOSE, BUN, CALCIUM, CREATININE, GFRNONAA, GFRAA in the last 8760 hours.  Invalid input(s): CMP     Electronically Signed: Earley Abide, PA-C 10/28/2020, 2:32 PM   I spent a total of 20 minutes in face to face in clinical consultation, greater than 50% of which was counseling/coordinating care for HD access.

## 2020-10-28 NOTE — Procedures (Signed)
Interventional Radiology Procedure Note  Procedure: Tunneled HD Catheter  Indication: Renal failure  Findings: Please refer to procedural dictation for full description.  Complications: None  EBL: < 10 mL  Miachel Roux, MD [phone]

## 2020-11-06 ENCOUNTER — Telehealth: Payer: Self-pay

## 2020-11-06 NOTE — Telephone Encounter (Signed)
Received a call from [facility]. Patient had fistula created in 2020. She was last seen in our office on 08/2019. At that visit, it was recommended once she neared dialysis - she would need a fistulogram. She started dialysis 3 days ago and is currently using a catheter. Discussed with PA, patient will need an o/v and a dialysis access duplex prior to scheduling a procedure. I have left a message with the [facility] in [place] to call and schedule that appt.

## 2020-12-29 DEATH — deceased

## 2021-02-09 ENCOUNTER — Ambulatory Visit: Payer: Medicare Other | Admitting: Cardiology
